# Patient Record
Sex: Female | Born: 1970
Health system: Southern US, Community
[De-identification: ages and names within clinical notes are randomized; demographics above are authoritative.]

## PROBLEM LIST (undated history)

## (undated) DIAGNOSIS — N6452 Nipple discharge: Secondary | ICD-10-CM

## (undated) DIAGNOSIS — K219 Gastro-esophageal reflux disease without esophagitis: Secondary | ICD-10-CM

## (undated) DIAGNOSIS — R638 Other symptoms and signs concerning food and fluid intake: Secondary | ICD-10-CM

## (undated) DIAGNOSIS — D696 Thrombocytopenia, unspecified: Secondary | ICD-10-CM

## (undated) DIAGNOSIS — I471 Supraventricular tachycardia, unspecified: Secondary | ICD-10-CM

## (undated) DIAGNOSIS — Z8719 Personal history of other diseases of the digestive system: Secondary | ICD-10-CM

## (undated) DIAGNOSIS — E119 Type 2 diabetes mellitus without complications: Secondary | ICD-10-CM

## (undated) DIAGNOSIS — N920 Excessive and frequent menstruation with regular cycle: Secondary | ICD-10-CM

## (undated) DIAGNOSIS — E876 Hypokalemia: Secondary | ICD-10-CM

## (undated) DIAGNOSIS — Z8249 Family history of ischemic heart disease and other diseases of the circulatory system: Secondary | ICD-10-CM

## (undated) DIAGNOSIS — I499 Cardiac arrhythmia, unspecified: Secondary | ICD-10-CM

## (undated) DIAGNOSIS — I10 Essential (primary) hypertension: Secondary | ICD-10-CM

## (undated) DIAGNOSIS — E785 Hyperlipidemia, unspecified: Secondary | ICD-10-CM

## (undated) DIAGNOSIS — E0789 Other specified disorders of thyroid: Secondary | ICD-10-CM

## (undated) DIAGNOSIS — T7840XA Allergy, unspecified, initial encounter: Secondary | ICD-10-CM

## (undated) DIAGNOSIS — Z8619 Personal history of other infectious and parasitic diseases: Secondary | ICD-10-CM

## (undated) DIAGNOSIS — E2609 Other primary hyperaldosteronism: Secondary | ICD-10-CM

## (undated) HISTORY — DX: Allergy, unspecified, initial encounter: T78.40XA

## (undated) HISTORY — DX: Thrombocytopenia, unspecified: D69.6

## (undated) HISTORY — DX: Gastro-esophageal reflux disease without esophagitis: K21.9

## (undated) HISTORY — DX: Essential (primary) hypertension: I10

## (undated) HISTORY — DX: Hyperlipidemia, unspecified: E78.5

## (undated) HISTORY — DX: Other primary hyperaldosteronism: E26.09

## (undated) HISTORY — DX: Personal history of other diseases of the digestive system: Z87.19

## (undated) HISTORY — DX: Personal history of other infectious and parasitic diseases: Z86.19

## (undated) HISTORY — DX: Family history of ischemic heart disease and other diseases of the circulatory system: Z82.49

## (undated) HISTORY — DX: Hypokalemia: E87.6

## (undated) HISTORY — DX: Other symptoms and signs concerning food and fluid intake: R63.8

## (undated) HISTORY — PX: ABDOMINAL SURGERY: SHX537

## (undated) HISTORY — PX: HEMORRHOID SURGERY: SHX153

## (undated) HISTORY — PX: UTERINE FIBROID EMBOLIZATION: SHX825

## (undated) HISTORY — DX: Other specified disorders of thyroid: E07.89

---

## 1998-03-05 ENCOUNTER — Inpatient Hospital Stay (HOSPITAL_COMMUNITY): Admission: AD | Admit: 1998-03-05 | Discharge: 1998-03-05 | Payer: Self-pay | Admitting: Obstetrics

## 1998-06-24 ENCOUNTER — Emergency Department (HOSPITAL_COMMUNITY): Admission: EM | Admit: 1998-06-24 | Discharge: 1998-06-24 | Payer: Self-pay

## 1998-10-07 ENCOUNTER — Inpatient Hospital Stay (HOSPITAL_COMMUNITY): Admission: AD | Admit: 1998-10-07 | Discharge: 1998-10-07 | Payer: Self-pay | Admitting: *Deleted

## 1998-11-23 ENCOUNTER — Inpatient Hospital Stay (HOSPITAL_COMMUNITY): Admission: AD | Admit: 1998-11-23 | Discharge: 1998-11-23 | Payer: Self-pay | Admitting: Obstetrics

## 1999-01-12 ENCOUNTER — Inpatient Hospital Stay (HOSPITAL_COMMUNITY): Admission: AD | Admit: 1999-01-12 | Discharge: 1999-01-12 | Payer: Self-pay | Admitting: *Deleted

## 1999-06-08 ENCOUNTER — Emergency Department (HOSPITAL_COMMUNITY): Admission: EM | Admit: 1999-06-08 | Discharge: 1999-06-08 | Payer: Self-pay | Admitting: *Deleted

## 1999-06-17 ENCOUNTER — Other Ambulatory Visit: Admission: RE | Admit: 1999-06-17 | Discharge: 1999-06-17 | Payer: Self-pay | Admitting: Family Medicine

## 2001-10-05 ENCOUNTER — Emergency Department (HOSPITAL_COMMUNITY): Admission: EM | Admit: 2001-10-05 | Discharge: 2001-10-06 | Payer: Self-pay | Admitting: *Deleted

## 2002-07-30 ENCOUNTER — Inpatient Hospital Stay (HOSPITAL_COMMUNITY): Admission: AD | Admit: 2002-07-30 | Discharge: 2002-07-30 | Payer: Self-pay | Admitting: Obstetrics and Gynecology

## 2004-07-22 ENCOUNTER — Inpatient Hospital Stay (HOSPITAL_COMMUNITY): Admission: AD | Admit: 2004-07-22 | Discharge: 2004-07-22 | Payer: Self-pay | Admitting: Obstetrics and Gynecology

## 2004-07-26 ENCOUNTER — Inpatient Hospital Stay (HOSPITAL_COMMUNITY): Admission: AD | Admit: 2004-07-26 | Discharge: 2004-07-26 | Payer: Self-pay | Admitting: Obstetrics & Gynecology

## 2004-07-30 ENCOUNTER — Inpatient Hospital Stay (HOSPITAL_COMMUNITY): Admission: AD | Admit: 2004-07-30 | Discharge: 2004-07-30 | Payer: Self-pay | Admitting: Family Medicine

## 2004-08-27 ENCOUNTER — Other Ambulatory Visit: Admission: RE | Admit: 2004-08-27 | Discharge: 2004-08-27 | Payer: Self-pay | Admitting: Obstetrics and Gynecology

## 2005-05-20 ENCOUNTER — Emergency Department (HOSPITAL_COMMUNITY): Admission: EM | Admit: 2005-05-20 | Discharge: 2005-05-20 | Payer: Self-pay | Admitting: Emergency Medicine

## 2005-07-15 ENCOUNTER — Ambulatory Visit: Payer: Self-pay | Admitting: Internal Medicine

## 2005-08-15 DIAGNOSIS — E0789 Other specified disorders of thyroid: Secondary | ICD-10-CM

## 2005-08-15 HISTORY — DX: Other specified disorders of thyroid: E07.89

## 2005-09-08 ENCOUNTER — Other Ambulatory Visit: Admission: RE | Admit: 2005-09-08 | Discharge: 2005-09-08 | Payer: Self-pay | Admitting: Obstetrics and Gynecology

## 2005-09-10 ENCOUNTER — Ambulatory Visit: Payer: Self-pay | Admitting: Internal Medicine

## 2005-12-27 ENCOUNTER — Ambulatory Visit: Payer: Self-pay | Admitting: Internal Medicine

## 2006-03-03 ENCOUNTER — Ambulatory Visit (HOSPITAL_COMMUNITY): Admission: RE | Admit: 2006-03-03 | Discharge: 2006-03-03 | Payer: Self-pay | Admitting: Internal Medicine

## 2006-03-03 ENCOUNTER — Ambulatory Visit: Payer: Self-pay | Admitting: Internal Medicine

## 2006-06-24 ENCOUNTER — Ambulatory Visit: Payer: Self-pay | Admitting: Internal Medicine

## 2006-06-24 LAB — CONVERTED CEMR LAB
Basophils Absolute: 0 10*3/uL (ref 0.0–0.1)
Basophils Relative: 0.5 % (ref 0.0–1.0)
Eosinophils Absolute: 0.1 10*3/uL (ref 0.0–0.6)
Eosinophils Relative: 1 % (ref 0.0–5.0)
HCT: 37.9 % (ref 36.0–46.0)
Hemoglobin: 13.2 g/dL (ref 12.0–15.0)
Lymphocytes Relative: 36.5 % (ref 12.0–46.0)
MCHC: 34.8 g/dL (ref 30.0–36.0)
MCV: 83.9 fL (ref 78.0–100.0)
Monocytes Absolute: 0.3 10*3/uL (ref 0.2–0.7)
Monocytes Relative: 3.6 % (ref 3.0–11.0)
Neutro Abs: 4.9 10*3/uL (ref 1.4–7.7)
Neutrophils Relative %: 58.4 % (ref 43.0–77.0)
Platelets: 526 10*3/uL — ABNORMAL HIGH (ref 150–400)
RBC: 4.51 M/uL (ref 3.87–5.11)
RDW: 13.4 % (ref 11.5–14.6)
WBC: 8.3 10*3/uL (ref 4.5–10.5)

## 2006-11-02 ENCOUNTER — Ambulatory Visit: Payer: Self-pay | Admitting: Internal Medicine

## 2006-11-02 LAB — CONVERTED CEMR LAB
ALT: 14 units/L (ref 0–40)
AST: 14 units/L (ref 0–37)
Albumin: 3.5 g/dL (ref 3.5–5.2)
Alkaline Phosphatase: 58 units/L (ref 39–117)
BUN: 11 mg/dL (ref 6–23)
Basophils Absolute: 0 10*3/uL (ref 0.0–0.1)
Basophils Relative: 0.3 % (ref 0.0–1.0)
Bilirubin Urine: NEGATIVE
Bilirubin, Direct: 0.1 mg/dL (ref 0.0–0.3)
CO2: 32 meq/L (ref 19–32)
Calcium: 8.9 mg/dL (ref 8.4–10.5)
Chloride: 104 meq/L (ref 96–112)
Cholesterol: 213 mg/dL (ref 0–200)
Creatinine, Ser: 0.9 mg/dL (ref 0.4–1.2)
Crystals: NEGATIVE
Direct LDL: 146.9 mg/dL
Eosinophils Absolute: 0.1 10*3/uL (ref 0.0–0.6)
Eosinophils Relative: 1 % (ref 0.0–5.0)
GFR calc Af Amer: 91 mL/min
GFR calc non Af Amer: 75 mL/min
Glucose, Bld: 113 mg/dL — ABNORMAL HIGH (ref 70–99)
HCT: 38.1 % (ref 36.0–46.0)
HDL: 35.3 mg/dL — ABNORMAL LOW (ref >39.0)
Hemoglobin: 13 g/dL (ref 12.0–15.0)
Hgb A1c MFr Bld: 6.3 % — ABNORMAL HIGH (ref 4.6–6.0)
Ketones, ur: NEGATIVE mg/dL
Leukocytes, UA: NEGATIVE
Lymphocytes Relative: 33.2 % (ref 12.0–46.0)
MCHC: 34.2 g/dL (ref 30.0–36.0)
MCV: 83.2 fL (ref 78.0–100.0)
Monocytes Absolute: 0.6 10*3/uL (ref 0.2–0.7)
Monocytes Relative: 6.6 % (ref 3.0–11.0)
Neutro Abs: 5.2 10*3/uL (ref 1.4–7.7)
Neutrophils Relative %: 58.9 % (ref 43.0–77.0)
Nitrite: POSITIVE — AB
Platelets: 503 10*3/uL — ABNORMAL HIGH (ref 150–400)
Potassium: 2.5 meq/L — CL (ref 3.5–5.1)
RBC: 4.59 M/uL (ref 3.87–5.11)
RDW: 12.9 % (ref 11.5–14.6)
Sodium: 140 meq/L (ref 135–145)
Specific Gravity, Urine: 1.02 (ref 1.000–1.03)
TSH: 1.7 microintl units/mL (ref 0.35–5.50)
Total Bilirubin: 0.4 mg/dL (ref 0.3–1.2)
Total CHOL/HDL Ratio: 6
Total Protein: 7.6 g/dL (ref 6.0–8.3)
Triglycerides: 154 mg/dL — ABNORMAL HIGH (ref 0–149)
Urine Glucose: NEGATIVE mg/dL
Urobilinogen, UA: 0.2 (ref 0.0–1.0)
VLDL: 31 mg/dL (ref 0–40)
WBC: 8.9 10*3/uL (ref 4.5–10.5)
pH: 6 (ref 5.0–8.0)

## 2006-12-31 ENCOUNTER — Emergency Department (HOSPITAL_COMMUNITY): Admission: EM | Admit: 2006-12-31 | Discharge: 2006-12-31 | Payer: Self-pay | Admitting: Emergency Medicine

## 2007-01-04 ENCOUNTER — Encounter: Payer: Self-pay | Admitting: *Deleted

## 2007-01-04 DIAGNOSIS — I10 Essential (primary) hypertension: Secondary | ICD-10-CM | POA: Insufficient documentation

## 2007-01-04 DIAGNOSIS — K219 Gastro-esophageal reflux disease without esophagitis: Secondary | ICD-10-CM | POA: Insufficient documentation

## 2007-04-07 ENCOUNTER — Encounter: Admission: RE | Admit: 2007-04-07 | Discharge: 2007-04-07 | Payer: Self-pay | Admitting: Gastroenterology

## 2007-04-19 ENCOUNTER — Encounter: Admission: RE | Admit: 2007-04-19 | Discharge: 2007-04-19 | Payer: Self-pay | Admitting: Obstetrics and Gynecology

## 2007-12-14 ENCOUNTER — Ambulatory Visit (HOSPITAL_COMMUNITY): Admission: RE | Admit: 2007-12-14 | Discharge: 2007-12-14 | Payer: Self-pay | Admitting: Gastroenterology

## 2008-04-18 LAB — CONVERTED CEMR LAB: Pap Smear: NORMAL

## 2008-04-22 ENCOUNTER — Ambulatory Visit: Payer: Self-pay | Admitting: *Deleted

## 2008-04-22 DIAGNOSIS — D473 Essential (hemorrhagic) thrombocythemia: Secondary | ICD-10-CM | POA: Insufficient documentation

## 2008-04-22 DIAGNOSIS — D759 Disease of blood and blood-forming organs, unspecified: Secondary | ICD-10-CM | POA: Insufficient documentation

## 2008-04-22 DIAGNOSIS — E785 Hyperlipidemia, unspecified: Secondary | ICD-10-CM | POA: Insufficient documentation

## 2008-04-22 DIAGNOSIS — R32 Unspecified urinary incontinence: Secondary | ICD-10-CM | POA: Insufficient documentation

## 2008-04-22 DIAGNOSIS — E876 Hypokalemia: Secondary | ICD-10-CM | POA: Insufficient documentation

## 2008-04-22 DIAGNOSIS — E1169 Type 2 diabetes mellitus with other specified complication: Secondary | ICD-10-CM | POA: Insufficient documentation

## 2008-04-24 LAB — CONVERTED CEMR LAB
ALT: 14 units/L (ref 0–35)
AST: 17 units/L (ref 0–37)
Albumin: 3.5 g/dL (ref 3.5–5.2)
Alkaline Phosphatase: 52 units/L (ref 39–117)
BUN: 11 mg/dL (ref 6–23)
CO2: 33 meq/L — ABNORMAL HIGH (ref 19–32)
Calcium: 8.3 mg/dL — ABNORMAL LOW (ref 8.4–10.5)
Chloride: 98 meq/L (ref 96–112)
Cholesterol: 218 mg/dL (ref 0–200)
Creatinine, Ser: 0.7 mg/dL (ref 0.4–1.2)
Creatinine,U: 168 mg/dL
Direct LDL: 156.3 mg/dL
GFR calc Af Amer: 120 mL/min
GFR calc non Af Amer: 99 mL/min
Glucose, Bld: 90 mg/dL (ref 70–99)
HCT: 37.4 % (ref 36.0–46.0)
HDL: 35.7 mg/dL — ABNORMAL LOW (ref >39.0)
Hemoglobin: 13 g/dL (ref 12.0–15.0)
Hgb A1c MFr Bld: 6.4 % — ABNORMAL HIGH (ref 4.6–6.0)
LDL Cholesterol: 155 mg/dL — ABNORMAL HIGH (ref 0–99)
MCHC: 34.7 g/dL (ref 30.0–36.0)
MCV: 84.3 fL (ref 78.0–100.0)
Microalb Creat Ratio: 30.4 mg/g — ABNORMAL HIGH (ref 0.0–30.0)
Microalb, Ur: 5.1 mg/dL — ABNORMAL HIGH (ref 0.0–1.9)
Monocytes Absolute: 2.7 10*3/uL — ABNORMAL HIGH (ref 0.1–1.0)
Platelets: 496 10*3/uL — ABNORMAL HIGH (ref 150–400)
Potassium: 2.8 meq/L — ABNORMAL LOW (ref 3.5–5.1)
RBC: 4.43 M/uL (ref 3.87–5.11)
RDW: 13.1 % (ref 11.5–14.6)
Sodium: 138 meq/L (ref 135–145)
TSH: 2.19 microintl units/mL (ref 0.35–5.50)
Total Bilirubin: 0.6 mg/dL (ref 0.3–1.2)
Total CHOL/HDL Ratio: 6.1
Total Protein: 7.7 g/dL (ref 6.0–8.3)
Triglycerides: 138 mg/dL (ref 0–149)
VLDL: 28 mg/dL (ref 0–40)
WBC: 7 10*3/uL (ref 4.5–10.5)

## 2008-04-29 ENCOUNTER — Ambulatory Visit: Payer: Self-pay | Admitting: *Deleted

## 2008-04-30 ENCOUNTER — Ambulatory Visit (HOSPITAL_BASED_OUTPATIENT_CLINIC_OR_DEPARTMENT_OTHER): Admission: RE | Admit: 2008-04-30 | Discharge: 2008-04-30 | Payer: Self-pay | Admitting: Obstetrics and Gynecology

## 2008-05-01 ENCOUNTER — Telehealth (INDEPENDENT_AMBULATORY_CARE_PROVIDER_SITE_OTHER): Payer: Self-pay | Admitting: *Deleted

## 2008-05-01 ENCOUNTER — Telehealth: Payer: Self-pay | Admitting: Internal Medicine

## 2008-05-01 LAB — CONVERTED CEMR LAB
Magnesium: 2.1 mg/dL (ref 1.5–2.5)
Potassium: 2.6 meq/L — CL (ref 3.5–5.1)

## 2008-05-03 ENCOUNTER — Ambulatory Visit: Payer: Self-pay | Admitting: Internal Medicine

## 2008-05-03 ENCOUNTER — Ambulatory Visit: Payer: Self-pay | Admitting: Radiology

## 2008-05-08 ENCOUNTER — Ambulatory Visit: Payer: Self-pay | Admitting: Internal Medicine

## 2008-05-08 ENCOUNTER — Telehealth (INDEPENDENT_AMBULATORY_CARE_PROVIDER_SITE_OTHER): Payer: Self-pay | Admitting: *Deleted

## 2008-05-08 LAB — CONVERTED CEMR LAB
BUN: 19 mg/dL (ref 6–23)
CO2: 29 meq/L (ref 19–32)
Calcium: 8.7 mg/dL (ref 8.4–10.5)
Chloride: 105 meq/L (ref 96–112)
Creatinine, Ser: 0.8 mg/dL (ref 0.4–1.2)
GFR calc Af Amer: 103 mL/min
GFR calc non Af Amer: 85 mL/min
Glucose, Bld: 119 mg/dL — ABNORMAL HIGH (ref 70–99)
Potassium: 3.4 meq/L — ABNORMAL LOW (ref 3.5–5.1)
Sodium: 141 meq/L (ref 135–145)

## 2008-05-13 ENCOUNTER — Telehealth (INDEPENDENT_AMBULATORY_CARE_PROVIDER_SITE_OTHER): Payer: Self-pay | Admitting: *Deleted

## 2008-05-17 HISTORY — PX: UTERINE FIBROID EMBOLIZATION: SHX825

## 2008-05-24 ENCOUNTER — Ambulatory Visit: Payer: Self-pay | Admitting: *Deleted

## 2008-05-24 DIAGNOSIS — J309 Allergic rhinitis, unspecified: Secondary | ICD-10-CM | POA: Insufficient documentation

## 2008-05-24 LAB — CONVERTED CEMR LAB
BUN: 21 mg/dL (ref 6–23)
CO2: 30 meq/L (ref 19–32)
Calcium: 9.6 mg/dL (ref 8.4–10.5)
Chloride: 104 meq/L (ref 96–112)
Creatinine, Ser: 0.8 mg/dL (ref 0.4–1.2)
GFR calc Af Amer: 103 mL/min
GFR calc non Af Amer: 85 mL/min
Glucose, Bld: 108 mg/dL — ABNORMAL HIGH (ref 70–99)
Potassium: 3.5 meq/L (ref 3.5–5.1)
Sodium: 140 meq/L (ref 135–145)

## 2008-07-08 ENCOUNTER — Ambulatory Visit: Payer: Self-pay | Admitting: *Deleted

## 2008-07-15 ENCOUNTER — Encounter: Payer: Self-pay | Admitting: Internal Medicine

## 2008-07-19 ENCOUNTER — Ambulatory Visit: Payer: Self-pay | Admitting: *Deleted

## 2008-09-12 ENCOUNTER — Ambulatory Visit: Payer: Self-pay | Admitting: Internal Medicine

## 2008-09-12 DIAGNOSIS — IMO0002 Reserved for concepts with insufficient information to code with codable children: Secondary | ICD-10-CM | POA: Insufficient documentation

## 2008-11-25 ENCOUNTER — Telehealth: Payer: Self-pay | Admitting: Internal Medicine

## 2009-02-07 ENCOUNTER — Ambulatory Visit: Payer: Self-pay | Admitting: Internal Medicine

## 2009-02-07 DIAGNOSIS — K649 Unspecified hemorrhoids: Secondary | ICD-10-CM | POA: Insufficient documentation

## 2009-02-07 DIAGNOSIS — N390 Urinary tract infection, site not specified: Secondary | ICD-10-CM | POA: Insufficient documentation

## 2009-02-07 DIAGNOSIS — K644 Residual hemorrhoidal skin tags: Secondary | ICD-10-CM | POA: Insufficient documentation

## 2009-02-07 DIAGNOSIS — E119 Type 2 diabetes mellitus without complications: Secondary | ICD-10-CM | POA: Insufficient documentation

## 2009-02-07 DIAGNOSIS — E118 Type 2 diabetes mellitus with unspecified complications: Secondary | ICD-10-CM | POA: Insufficient documentation

## 2009-02-07 LAB — CONVERTED CEMR LAB
ALT: 10 units/L (ref 0–35)
AST: 11 units/L (ref 0–37)
Albumin: 4 g/dL (ref 3.5–5.2)
Alkaline Phosphatase: 55 units/L (ref 39–117)
BUN: 17 mg/dL (ref 6–23)
Bilirubin Urine: NEGATIVE
Bilirubin, Direct: 0.1 mg/dL (ref 0.0–0.3)
CO2: 24 meq/L (ref 19–32)
Calcium: 9.1 mg/dL (ref 8.4–10.5)
Chloride: 102 meq/L (ref 96–112)
Cholesterol: 205 mg/dL — ABNORMAL HIGH (ref 0–200)
Creatinine, Ser: 0.9 mg/dL (ref 0.40–1.20)
Glucose, Bld: 106 mg/dL — ABNORMAL HIGH (ref 70–99)
Glucose, Urine, Semiquant: NEGATIVE
HDL: 38 mg/dL — ABNORMAL LOW (ref >39)
Hgb A1c MFr Bld: 6.1 % (ref 4.6–6.1)
Indirect Bilirubin: 0.2 mg/dL (ref 0.0–0.9)
Ketones, urine, test strip: NEGATIVE
LDL Cholesterol: 143 mg/dL — ABNORMAL HIGH (ref 0–99)
Nitrite: NEGATIVE
Potassium: 3.6 meq/L (ref 3.5–5.3)
Protein, U semiquant: >=300
Sodium: 138 meq/L (ref 135–145)
Specific Gravity, Urine: 1.02
TSH: 2.738 microintl units/mL (ref 0.350–4.500)
Total Bilirubin: 0.3 mg/dL (ref 0.3–1.2)
Total CHOL/HDL Ratio: 5.4
Total Protein: 7.6 g/dL (ref 6.0–8.3)
Triglycerides: 122 mg/dL (ref <150)
Urobilinogen, UA: 0.2
VLDL: 24 mg/dL (ref 0–40)
pH: 6

## 2009-02-10 ENCOUNTER — Encounter: Payer: Self-pay | Admitting: Internal Medicine

## 2009-03-17 ENCOUNTER — Encounter: Payer: Self-pay | Admitting: Internal Medicine

## 2009-03-17 LAB — HM DIABETES EYE EXAM: HM Diabetic Eye Exam: NORMAL

## 2009-03-19 ENCOUNTER — Encounter: Payer: Self-pay | Admitting: Internal Medicine

## 2009-04-18 ENCOUNTER — Ambulatory Visit: Payer: Self-pay | Admitting: Family

## 2009-04-18 DIAGNOSIS — F4321 Adjustment disorder with depressed mood: Secondary | ICD-10-CM | POA: Insufficient documentation

## 2009-04-24 LAB — CONVERTED CEMR LAB

## 2009-04-25 ENCOUNTER — Ambulatory Visit: Payer: Self-pay | Admitting: Internal Medicine

## 2009-04-25 LAB — CONVERTED CEMR LAB
BUN: 14 mg/dL (ref 6–23)
CO2: 28 meq/L (ref 19–32)
CRP: 0.4 mg/dL (ref <0.6)
Calcium: 8.9 mg/dL (ref 8.4–10.5)
Chloride: 101 meq/L (ref 96–112)
Cholesterol, target level: 200 mg/dL
Cholesterol: 235 mg/dL — ABNORMAL HIGH (ref 0–200)
Creatinine, Ser: 0.83 mg/dL (ref 0.40–1.20)
Glucose, Bld: 115 mg/dL — ABNORMAL HIGH (ref 70–99)
HDL goal, serum: 40 mg/dL
HDL: 46 mg/dL (ref >39)
Hgb A1c MFr Bld: 6.3 % — ABNORMAL HIGH (ref 4.6–6.1)
LDL Cholesterol: 167 mg/dL — ABNORMAL HIGH (ref 0–99)
LDL Goal: 130 mg/dL
Potassium: 3.5 meq/L (ref 3.5–5.3)
Sodium: 140 meq/L (ref 135–145)
Total CHOL/HDL Ratio: 5.1
Triglycerides: 110 mg/dL (ref <150)
VLDL: 22 mg/dL (ref 0–40)

## 2009-04-27 ENCOUNTER — Encounter: Payer: Self-pay | Admitting: Internal Medicine

## 2009-05-15 ENCOUNTER — Ambulatory Visit (HOSPITAL_BASED_OUTPATIENT_CLINIC_OR_DEPARTMENT_OTHER): Admission: RE | Admit: 2009-05-15 | Discharge: 2009-05-15 | Payer: Self-pay | Admitting: Internal Medicine

## 2009-05-15 LAB — HM MAMMOGRAPHY: HM Mammogram: NORMAL

## 2009-05-19 ENCOUNTER — Encounter (INDEPENDENT_AMBULATORY_CARE_PROVIDER_SITE_OTHER): Payer: Self-pay | Admitting: *Deleted

## 2009-05-19 ENCOUNTER — Ambulatory Visit: Payer: Self-pay | Admitting: Internal Medicine

## 2009-06-04 ENCOUNTER — Encounter: Payer: Self-pay | Admitting: Internal Medicine

## 2010-03-05 ENCOUNTER — Ambulatory Visit: Payer: Self-pay | Admitting: Internal Medicine

## 2010-03-05 LAB — CONVERTED CEMR LAB
ALT: 13 units/L (ref 0–35)
AST: 12 units/L (ref 0–37)
Albumin: 4 g/dL (ref 3.5–5.2)
Alkaline Phosphatase: 51 units/L (ref 39–117)
BUN: 19 mg/dL (ref 6–23)
Bilirubin, Direct: <0.1 mg/dL (ref 0.0–0.3)
CO2: 28 meq/L (ref 19–32)
Calcium: 8.9 mg/dL (ref 8.4–10.5)
Chloride: 101 meq/L (ref 96–112)
Cholesterol: 191 mg/dL (ref 0–200)
Creatinine, Ser: 0.81 mg/dL (ref 0.40–1.20)
Creatinine, Urine: 90.9 mg/dL
Glucose, Bld: 112 mg/dL — ABNORMAL HIGH (ref 70–99)
HDL: 40 mg/dL (ref >39)
Hgb A1c MFr Bld: 6.2 % — ABNORMAL HIGH (ref <5.7)
LDL Cholesterol: 124 mg/dL — ABNORMAL HIGH (ref 0–99)
Microalb Creat Ratio: 38.6 mg/g — ABNORMAL HIGH (ref 0.0–30.0)
Microalb, Ur: 3.51 mg/dL — ABNORMAL HIGH (ref 0.00–1.89)
Potassium: 3.6 meq/L (ref 3.5–5.3)
Sodium: 140 meq/L (ref 135–145)
Total Bilirubin: 0.3 mg/dL (ref 0.3–1.2)
Total CHOL/HDL Ratio: 4.8
Total Protein: 7.5 g/dL (ref 6.0–8.3)
Triglycerides: 137 mg/dL (ref <150)
VLDL: 27 mg/dL (ref 0–40)

## 2010-03-05 LAB — HM DIABETES FOOT EXAM

## 2010-03-06 ENCOUNTER — Encounter: Payer: Self-pay | Admitting: Internal Medicine

## 2010-04-28 ENCOUNTER — Ambulatory Visit: Payer: Self-pay | Admitting: Family

## 2010-04-28 LAB — CONVERTED CEMR LAB
Bilirubin Urine: NEGATIVE
Glucose, Urine, Semiquant: NEGATIVE
Ketones, urine, test strip: NEGATIVE
Nitrite: NEGATIVE
Protein, U semiquant: NEGATIVE
Specific Gravity, Urine: 1.01
Urobilinogen, UA: 0.2
pH: 7.5

## 2010-04-29 ENCOUNTER — Encounter: Payer: Self-pay | Admitting: Internal Medicine

## 2010-05-08 ENCOUNTER — Ambulatory Visit: Payer: Self-pay | Admitting: Internal Medicine

## 2010-06-07 ENCOUNTER — Encounter: Payer: Self-pay | Admitting: Internal Medicine

## 2010-06-17 DIAGNOSIS — R638 Other symptoms and signs concerning food and fluid intake: Secondary | ICD-10-CM

## 2010-06-17 DIAGNOSIS — E6609 Other obesity due to excess calories: Secondary | ICD-10-CM

## 2010-06-17 DIAGNOSIS — E66812 Obesity, class 2: Secondary | ICD-10-CM

## 2010-06-17 HISTORY — DX: Other symptoms and signs concerning food and fluid intake: R63.8

## 2010-06-17 HISTORY — DX: Other obesity due to excess calories: E66.09

## 2010-06-17 HISTORY — DX: Obesity, class 2: E66.812

## 2010-06-18 NOTE — Letter (Signed)
   [place] [facility] [address] [place], [place]  [address]  [place] Phone: [phone]      February 10, 2009   Lynise Gowans [address] [place], [place] [address]  RE:  LAB RESULTS  Dear  Ms. Warzecha,  The following is an interpretation of your most recent lab tests.  Please take note of any instructions provided or changes to medications that have resulted from your lab work.  ELECTROLYTES:  Good - no changes needed  KIDNEY FUNCTION TESTS:  Good - no changes needed  LIVER FUNCTION TESTS:  Good - no changes needed  LIPID PANEL:  Fair - review at your next visit Triglyceride: 122   Cholesterol: 205   LDL: 143   HDL: 38   Chol/HDL%:  5.4 Ratio  THYROID STUDIES:  Thyroid studies normal TSH: 2.738     DIABETIC STUDIES:  Poor - schedule a follow-up appointment soon Blood Glucose: 106   HgbA1C: 6.1   Microalbumin/Creatinine Ratio: 30.4      I will further discuss your lab results at your next follow up appointment.     Sincerely Yours,    Dr. Thomos Lemons

## 2010-06-18 NOTE — Assessment & Plan Note (Signed)
Summary: Follow up/mhf   Vital Signs:  Patient profile:   40 year old female Menstrual status:  regular Height:      62 inches Weight:      209.25 pounds BMI:     38.41 O2 Sat:      98 % on Room air Temp:     98.0 degrees F oral Pulse rate:   84 / minute Pulse rhythm:   regular Resp:     20 per minute BP sitting:   144 / 90  (right arm) Cuff size:   large  Vitals Entered By: Glendell Docker CMA (March 05, 2010 8:54 AM)  O2 Flow:  Room air CC: Follow up Is Patient Diabetic? No Pain Assessment Patient in pain? no      Comments fasting for labs, GYN appt this afternoon at 3:30 LMP - Character: light Menarche (age onset years): 11   Menses interval (days): irregular Menstrual flow (days): 3-4 Last PAP Result Appt today   Primary Care Provider:  Dondra Spry DO  CC:  Follow up.  History of Present Illness:  Hypertension Follow-Up      This is a 40 year old woman who presents for Hypertension follow-up.  The patient denies lightheadedness and headaches.  The patient denies the following associated symptoms: chest pain.  Compliance with medications (by patient report) has been near 100%.    DM II borderline - wt gain  Preventive Screening-Counseling & Management  Alcohol-Tobacco     Alcohol drinks/day: 0     Smoking Status: never  Caffeine-Diet-Exercise     Caffeine use/day: None     Does Patient Exercise: no  Allergies: 1)  ! Lisinopril  Past History:  Past Medical History: GERD Hypertension Hx of ulcer chicken pox Urinary incontinence   Diabetes mellitus, type II borderline Hyperlipidemia thrombocytosis  hypokalemia Allergic rhinitis  Past Surgical History: abdominal surgery as a baby at 6 months - unsure of the surgery hemorrhoids surgery - 2005/2006    Family History: Family History Breast cancer 1st degree relative <50 Family History of CAD Female 1st degree relative <50 Family History Hypertension  Family History Lung cancer Family  History of Stroke M 1st degree relative <50       Social History: Occupation: Public house manager at [employer]  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   Neck:  supple and no masses.   Lungs:  normal respiratory effort and normal breath sounds.   Heart:  normal rate, regular rhythm, and no gallop.   Extremities:  No lower extremity edema  Neurologic:  cranial nerves II-XII intact and gait normal.   Psych:  normally interactive, good eye contact, not anxious appearing, and not depressed appearing.    Diabetes Management Exam:    Foot Exam (with socks and/or shoes not present):       Inspection:          Left foot: normal          Right foot: normal   Impression & Recommendations:  Problem # 1:  HYPERTENSION (ICD-401.9) Assessment Deteriorated exforge cost prohibitive.  add losartan to spironolactone  The following medications were removed from the medication list:    Exforge 5-160 Mg Tabs (Amlodipine besylate-valsartan) ..... One tab by mouth once daily Her updated medication list for this problem includes:    Spironolactone 25 Mg Tabs (Spironolactone) .Marland Kitchen... Take  1 tablet by mouth once a day    Losartan Potassium 50 Mg Tabs (Losartan potassium) ..... One by mouth once daily  BP today: 144/90 Prior BP: 130/90 (05/19/2009)  Prior 10 Yr Risk Heart Disease: 5 % (04/25/2009)  Labs Reviewed: K+: 3.5 (04/25/2009) Creat: : 0.83 (04/25/2009)   Chol: 235 (04/25/2009)   HDL: 46 (04/25/2009)   LDL: 167 (04/25/2009)   TG: 110 (04/25/2009)  Problem # 2:  DIABETES MELLITUS, TYPE II, BORDERLINE (ICD-790.29) Pt counseled on diet and exercise.  Her updated medication list for this problem includes:    Metformin Hcl 500 Mg Tabs (Metformin hcl) ..... One by mouth two times a day  Orders: T-Basic Metabolic Panel [phone]) T- Hemoglobin A1C (09811-91478) T-Urine Microalbumin  w/creat. ratio [id number])  Problem # 3:  HYPERLIPIDEMIA (ICD-272.4)  Her updated medication list for this problem includes:    Lovastatin 20 Mg Tabs (Lovastatin) ..... One by mouth q pm  Orders: T-Lipid Profile ([id number]) T-Hepatic Function [phone])  Complete Medication List: 1)  Freestyle Lite Test Strp (Glucose blood) .... Use as directed.  patient tests once per day.  diagnosis code 250.02 2)  Freestyle Lancets Misc (Lancets) .... Use as directed. patient tests once per day.  diagnosis code 250.02 3)  Spironolactone 25 Mg Tabs (Spironolactone) .... Take 1 tablet by mouth once a day 4)  Metformin Hcl 500 Mg Tabs (Metformin hcl) .... One by mouth two times a day 5)  Lovastatin 20 Mg Tabs (Lovastatin) .... One by mouth q pm 6)  Losartan Potassium 50 Mg Tabs (Losartan potassium) .... One by mouth once daily  Patient Instructions: 1)  Please schedule a follow-up appointment in 2 months. Prescriptions: LOSARTAN POTASSIUM 50 MG TABS (LOSARTAN POTASSIUM) one by mouth once daily  #30 x 2   Entered and Authorized by:   D. Thomos Lemons DO   Signed by:   D. Thomos Lemons DO on 03/05/2010   Method used:   Electronically to        [facility] [address] [place] (retail)       [phone]       [place], [place]  [address]       Ph: [phone]       Fax: [phone]   RxID:   [id number] LOVASTATIN 20 MG TABS (LOVASTATIN) one by mouth q pm  #90 x 1   Entered and Authorized by:   D. Thomos Lemons DO   Signed by:   D. Thomos Lemons DO on 03/05/2010   Method used:   Electronically to        [facility] [address] [place] (retail)       [phone]       [place], [place]  [address]       Ph: [phone]       Fax: [phone]   RxID:   [id number] METFORMIN HCL 500 MG TABS (METFORMIN HCL) one by mouth two times a day  #180 x 1   Entered and Authorized by:   D. Thomos Lemons DO   Signed by:   D. Thomos Lemons DO on  03/05/2010   Method used:   Electronically to        [facility] [address] [place] (retail)       [phone]       [place], [place]  [address]  Ph: [phone]       Fax: [phone]   RxID:   [id number] SPIRONOLACTONE 25 MG TABS (SPIRONOLACTONE) Take 1 tablet by mouth once a day  #90 x 1   Entered and Authorized by:   D. Thomos Lemons DO   Signed by:   D. Thomos Lemons DO on 03/05/2010   Method used:   Electronically to        [facility] [address] [place] [address] (retail)       [phone]       [place], [place]  [address]       Ph: [phone]       Fax: [phone]   RxID:   [id number]    Orders Added: 1)  T-Basic Metabolic Panel [phone] 2)  T- Hemoglobin A1C [83036-23375] 3)  T-Urine Microalbumin w/creat. ratio [[id number]] 4)  T-Lipid Profile [80061-22930] 5)  T-Hepatic Function [80076-22960] 6)  Est. Patient Level III [[address]01]   Immunization History:  Influenza Immunization History:    Influenza:  declined (03/05/2010)   Contraindications/Deferment of Procedures/Staging:    Test/Procedure: FLU VAX    Reason for deferment: patient declined   Immunization History:  Influenza Immunization History:    Influenza:  Declined (03/05/2010)  Current Allergies (reviewed today): ! LISINOPRIL   Preventive Care Screening  Pap Smear:    Date:  03/05/2010    Results:  Appt today

## 2010-06-18 NOTE — Letter (Signed)
Summary: [facility]  [facility]   Imported By: Lanelle Bal 06/19/2009 11:37:50  _____________________________________________________________________  External Attachment:    Type:   Image     Comment:   External Document

## 2010-06-18 NOTE — Assessment & Plan Note (Signed)
Summary: NEW PT TO BE EST.-CH   Vital Signs:  Patient Profile:   40 Years Old Female LMP:     04/13/2008 Height:     62 inches Weight:      211 pounds BMI:     38.73 O2 treatment:    Room Air Temp:     98.6 degrees F oral Pulse rate:   80 / minute Pulse rhythm:   regular Resp:     22 per minute BP sitting:   158 / 92  (right arm) Cuff size:   large  Vitals Entered By: Darra Lis RMA (April 22, 2008 3:11 PM)  Menstrual History: LMP (date): 04/13/2008 LMP - Character: heavy Menarche: 11 Menses interval: irregular days Menstrual flow: 3-4 days             Is Patient Diabetic? No     Visit Type:  new patient PCP:  Paulo Fruit MD  Chief Complaint:  Establish care with a new doctor.Marland Kitchen  History of Present Illness: Patient has been seen at another [place] office, but the [employer] office is closer to home for her - so she wants to establish care here.  She has a history of HTN in the past - she was on a fluid pill for a few months - but no meds for a long time.  She says she was told she did not need the medication any longer.  She had full labs done last year in [place] and those are reviewed with her today.  The cholesterol panel shows high triglycerides, LDL and risk ratio and low HDL - giving her a diagnosis of hyperlipidemia. The HgbA1-C at 6.3 gives her a diagnosis of diabetes - she has a history of gestational diabetes in the past.  She was not aware that she had diabetes, and she does not have a glucometer at home. The CBC shows an elevated platelet count - giving her a diagnosis of thrombocytosis.  She is not aware of having this problem in the past. The potassium is very low at 2.5 - but patient thinks that was while she was on a fluid pill - which she is no longer taking.      Labs Reviewed from 11/02/06: Tests: (1) LIPID PROFILE (LIPID)   CHOLESTEROL          [HH] 213 mg/dL                   1-610   HDL                         [L]  35.3 mg/dL                   >96.0   TRIGLYCERIDES        [H]  154 mg/dL                   4-540  CHOL/HDL Ratio: CHD Risk                             6.0 CALC   LDL CHOLESTEROL        [H]   146.9 mg/dL       <981   VLDL CHOLESTEROL          31 mg/dl                    1-91  Tests: (2) CBC WITH DIFF (CBCD)   WHITE CELL COUNT          8.9 K/uL                    4.5-10.5   HEMATOCRIT                      38.1 %                      36.0-46.0   HEMOGLOBIN                     13.0 g/dL                   81.1-91.4   LYMPHOCYTE %                33.2 %                      12.0-46.0   PLATELET COUNT       [H]  503 K/uL                    150-400   RED CELL COUNT            4.59 Mil/uL                 3.87-5.11  Tests: (4) BASIC METABOLIC PANEL (METABOL)   BUN                            11 mg/dL                    7-82   CALCIUM                      8.9 mg/dL                   9.5-62.1   CHLORIDE                     104 mEq/L                   96-112   CREATININE                   0.9 mg/dL                   3.0-8.6   CARBON DIOXIDE            32 mEq/L                    19-32   GLUCOSE                     [H]  113 mg/dL                   57-84   SODIUM                       140 mEq/L                   135-145   POTASSIUM                [LL] 2.5 mEq/L  3.5-5.1  Tests: (5) HEPATIC FUNCTION PANEL (HEPATIC)   ALBUMIN                          3.5 g/dL                    0.4-5.4   ALKALINE PHOS             58 U/L                      39-117   SGPT (ALT)                         14 U/L                      0-40   SGOT (AST)                        14 U/L                      0-37   TOTAL BILIRUBIN           0.4 mg/dL                   0.9-8.1   TOTAL PROTEIN             7.6 g/dL                    1.9-1.4   DIRECT BILIRUBIN          0.1 mg/dL                   7.8-2.9  Tests: (6) HGB A1C (A1C)   A1C%                               [H]  6.3 %                       4.6-6.0 Tests: (8) FastTSH (TSH)    FastTSH                          1.70 uIU/mL                 0.35-5.50     Updated Prior Medication List: No Medications Current Allergies (reviewed today): No known allergies   Past Medical History:    GERD    Hypertension    Hx of ulcer    chicken pox    Urinary incontinence    Diabetes mellitus, type II    Hyperlipidemia    thrombocytosis  Past Surgical History:    abdominal surgery as a baby at 6 months - unsure of the surgery   Family History:    Family History Breast cancer 1st degree relative <50    Family History of CAD Female 1st degree relative <50    Family History Hypertension    Family History Lung cancer    Family History of Stroke M 1st degree relative <50  Social History:    Occupation: Public house manager at [employer]     Single    2 children   Risk Factors:  Passive smoke exposure:  no Drug use:  no HIV high-risk behavior:  no  Caffeine use:  0 drinks per day Alcohol use:  yes    Type:  wine    Has patient --       Felt need to cut down:  no       Been annoyed by complaints:  no       Felt guilty about drinking:  no       Needed eye opener in the morning:  no    Comments:  once a year    Counseled to quit/cut down alcohol use:  no Exercise:  no Seatbelt use:  100 % Sun Exposure:  occasionally  Family History Risk Factors:    Family History of MI in females < 8 years old:  no    Family History of MI in males < 75 years old:  no  PAP Smear History:     Date of Last PAP Smear:  04/18/2008    Results:  Normal   Mammogram History:     Date of Last Mammogram:  04/18/2007    Results:  Normal Bilateral    Review of Systems       no nausea / vomiting / diarrhea / fever / chills / chest pain / SOB    Physical Exam  General:     Well-developed, well nourished, well hydrated, in no acute distress; appropriate and cooperative   Head:     Normocephalic and atraumatic without obvious abnormalities.  Eyes:     No corneal or conjunctival  inflammation. EOMI. PERRLA. Vision grossly normal. Ears:     External ear shows no significant lesions or deformities.  Otoscopic examination reveals clear canals, tympanic membranes normal bilaterally. Hearing is grossly normal. Nose:     External nasal examination shows no deformity or inflammation.  Mouth:     Oral mucosa and oropharynx without lesions, erythema or exudates.   Neck:     supple, full ROM, no masses, and no thyromegaly.  Lungs:     Normal respiratory effort, chest expands symmetrically with good air flow noted. Lungs clear to auscultation with no crackles, rales or wheezes.   Heart:     Normal rate and  rhythm. S1 and S2 normal, without gallop, murmur, click, rub  Abdomen:     Bowel sounds positive, abdomen soft and non-tender without masses, organomegaly or hernias noted. no distention  Msk:     No gross deformity noted of cervical, thoracic or lumbar spine. normal ROM. no muscle atrophy noted.   Extremities:     No clubbing, cyanosis, edema, or deformity noted, grossly normal full range of motion with all joints.   Neurologic:     alert & oriented X3,  gait normal.  no deficit in strength noted, no balance problems noted, neuro is grossly intact Skin:     Intact without suspicious lesions or rashes Psych:     Cognition and judgment appear intact. Alert and cooperative with normal attention span and concentration. No apparent delusions, illusions, hallucinations, normally interactive, good eye contact, not anxious or depressed appearing, and not agitated.       Impression & Recommendations:  Problem # 1:  DIABETES MELLITUS, UNCONTROLLED (ICD-250.02) Patient given a glucometer and instructed on its use.  She is given Agricultural engineer and questions answered.  labs were obtained. No meds started today - will see how labs look.   She will follow up in one month. Orders: TLB-A1C / Hgb A1C (Glycohemoglobin) (83036-A1C) TLB-Microalbumin/Creat Ratio, Urine  (82043-MALB) TLB-CMP (Comprehensive Metabolic Pnl) (80053-COMP) TLB-TSH (Thyroid  Stimulating Hormone) (84443-TSH)  Her updated medication list for this problem includes:    Lisinopril 10 Mg Tabs (Lisinopril) ..... One tab by mouth once daily   Problem # 2:  HYPERTENSION (ICD-401.9) will start lisinopril and get baseline labs.  follow up in one month. Orders: TLB-CMP (Comprehensive Metabolic Pnl) (80053-COMP) TLB-TSH (Thyroid Stimulating Hormone) (84443-TSH) TLB-Lipid Panel (80061-LIPID) Nutrition Referral (Nutrition)  Her updated medication list for this problem includes:    Lisinopril 10 Mg Tabs (Lisinopril) ..... One tab by mouth once daily   Problem # 3:  HYPERLIPIDEMIA (ICD-272.4) labs repeated.  handout given.  patient wants to try natural methods first. Orders: TLB-Lipid Panel (80061-LIPID) Nutrition Referral (Nutrition)   Problem # 4:  THROMBOCYTOSIS (ICD-289.9) will recheck labs.  if still elevated, will give referral to hematology Orders: TLB-CBC Platelet - w/Differential (85025-CBCD)   Problem # 5:  HYPOKALEMIA (ICD-276.8) patient thinks she was on a fluid pill at the time the labs were done last year and she is no longer on that.  will recheck labs.  will not use a fluid pill for her HTN.  Problem # 6:  ENCOUNTER FOR LONG-TERM USE OF OTHER MEDICATIONS (ICD-V58.69)  Orders: TLB-CBC Platelet - w/Differential (85025-CBCD) TLB-CMP (Comprehensive Metabolic Pnl) (80053-COMP) TLB-TSH (Thyroid Stimulating Hormone) (84443-TSH) TLB-Lipid Panel (80061-LIPID)   Complete Medication List: 1)  Lisinopril 10 Mg Tabs (Lisinopril) .... One tab by mouth once daily 2)  Freestyle Lite Test Strp (Glucose blood) .... Use as directed.  patient tests once per day.  diagnosis code 250.02 3)  Freestyle Lancets Misc (Lancets) .... Use as directed. patient tests once per day.  diagnosis code 250.02   Patient Instructions: 1)  Please schedule a follow-up appointment in 1  month.   Prescriptions: FREESTYLE LANCETS  MISC (LANCETS) use as directed. patient tests once per day.  diagnosis code 250.02  #100 x 1   Entered and Authorized by:   Paulo Fruit MD   Signed by:   Paulo Fruit MD on 04/22/2008   Method used:   Electronically to        [facility] [address] (retail)       [phone]       [place], [place]  [address]       Ph: [phone]       Fax: [phone]   RxID:   [id number] FREESTYLE LITE TEST  STRP (GLUCOSE BLOOD) Use as directed.  patient tests once per day.  diagnosis code 250.02  #100 x 1   Entered and Authorized by:   Paulo Fruit MD   Signed by:   Paulo Fruit MD on 04/22/2008   Method used:   Electronically to        [facility] [address] (retail)       [phone]       [place], [place]  [address]       Ph: [phone]       Fax: [phone]   RxID:   [id number] LISINOPRIL 10 MG TABS (LISINOPRIL) one tab by mouth once daily  #30 x 1   Entered and Authorized by:   Paulo Fruit MD   Signed by:   Paulo Fruit MD on 04/22/2008   Method used:   Electronically to        [facility] [address] (retail)       [phone]  [place], [place]  [address]       Ph: [phone]       Fax: [phone]   RxID:   [id number]  ]  Appended Document: NEW PT TO BE EST.-CH Patient has an prescription from her gyn for her mammogram and an appointment this week to have it completed.

## 2010-06-18 NOTE — Consult Note (Signed)
Summary: [facility]  [facility]   Imported By: Lanelle Bal 07/26/2008 08:29:56  _____________________________________________________________________  External Attachment:    Type:   Image     Comment:   External Document

## 2010-06-18 NOTE — Miscellaneous (Signed)
Summary: Eye Exam  Clinical Lists Changes  Observations: Added new observation of DMEYEEXAMNXT: 03/2010 (03/19/2009 15:08) Added new observation of DMEYEEXMRES: normal (03/17/2009 15:09) Added new observation of EYE EXAM BY: [facility] (03/17/2009 15:09) Added new observation of DIAB EYE EX: normal (03/17/2009 15:09)     Current Allergies: ! LISINOPRIL     Diabetes Management Exam:    Eye Exam:       Eye Exam done elsewhere          Date: 03/17/2009          Results: normal          Done by: [facility]

## 2010-06-18 NOTE — Letter (Signed)
   [place] at [facility] [address] [place], [place]  [address]  [place] Phone: [phone]      April 28, 2009   Delonna Donlon [address] [place], [place] [address]  RE:  LAB RESULTS  Dear  Ms. Inthavong,  The following is an interpretation of your most recent lab tests.  Please take note of any instructions provided or changes to medications that have resulted from your lab work.  ELECTROLYTES:  Good - no changes needed  KIDNEY FUNCTION TESTS:  Good - no changes needed  LIPID PANEL:  Please see the enclosed Rx for a change in medication Triglyceride: 110   Cholesterol: 235   LDL: 167   HDL: 46   Chol/HDL%:  5.1 Ratio   DIABETIC STUDIES:  Improved - continue management Blood Glucose: 115   HgbA1C: 6.3   Microalbumin/Creatinine Ratio: 30.4     I suggest you start pravastatin 40 mg once daily to lower you cholesterol levels.  I advise we repeat your lipid panel and liver function tests at your next office visit (fasting).      Medications Prescribed or Changed PRAVASTATIN SODIUM 40 MG TABS (PRAVASTATIN SODIUM) one by mouth qpm   Sincerely Yours,    Dr. Thomos Lemons

## 2010-06-18 NOTE — Letter (Signed)
Summary: Generic Letter  [facility]  [address] [place], [place] [address]   Phone: [phone]  Fax: [phone]        May 19, 2009 MRN: [id number]    Susan Whitaker [address] [place], [place]  [address]    To Whom It May Concern,    Mrs. Susan Whitaker has been cleared to return to work without any restrictions. This shall become effective on 05/20/2009. Please call my office with any questions or concerns.         Sincerely,  Thomos Lemons, D.O.  This letter has been electronically signed by your physician.

## 2010-06-18 NOTE — Progress Notes (Signed)
Summary: Hypokalemia discussion  Phone Note Other Incoming   Summary of Call: This is a follow up phone note regarding patient's hypokalemia.  I have labs from 04/30/08 showing potassium of 2.6  Unable to reach patient by phone regarding lab results. Called both home and work numbers on 04/30/08 and 05/01/08 in the am, afternoon and after hours on both days.  No answer, but [facility] asking patient to call.  She did not return calls. Initial call taken by: Paulo Fruit MD,  May 01, 2008 8:02 PM  Follow-up for Phone Call        Known history of patient's hypokalemia: On 04/22/08, the patient had her first appointment with me.  I reviewed previous labs done on 11/02/06 which showed a potassium level of 2.5 - patient stated that she believed she was on diuretic meds at that time (but was no longer on diuretics).  11/02/06 labs also revealed a diagnosis of diabetes, hyperlipidemia and thrombocytosis.  She was not aware of any of these findings per her report.  She had not had any labs since that time.  She did not have a home glucometer - so her diabetic control was unkown.  At the 04/22/08 appointment with me, she was restarted on BP meds - lisinopril, given a glucometer, and labs were repeated.  Potassium came back as 2.8 - the exact etiology of the hypokalemia was unclear.  She may have had some polyuria from uncontrolled diabetes, low dietary intake, increased excretion, GI loss . . . and a many other possible etiologies.  Please see labs dated 04/22/08 and addendum dated 04/24/08 for full discussion of labs.    Part of that discussion went as follows: Patient just started lisinopril the day the labs were done which may affect the potassium level.  Will order at pharmacy.  she is to take 4 tabs today, then one tab each day after that.  She is to return for a potassium level next Tuesday 04/29/08.  The labs drawn on 04/29/08 showed potassium level of 2.6 and a normal magnesium level.     Follow-up by: Paulo Fruit MD,  May 01, 2008 8:19 PM  Additional Follow-up for Phone Call Additional follow up Details #1::        After seeing the continued low level of potassium, I was trying to reach the patient by phone to confirm whether she was taking the potassium supplement and the lisinopril.  I also needed some additional information to determine the next best step in working up the hypokalemia including additional labwork and possible imaging studies.  At this point, I suspect hyperaldosteronisim, but I need to eliminate other more basic etiologies before moving forward with the more extensive work up.  I wanted to ask questions including whether or not there was any use of over the counter supplements or medications not yet reported - that could have hypokalemia as a side effect.  Any unreported diarrhea, vomiting, excessive fluid intake, etc.  Any use of  laxatives not reported previously or any  purging not reported - I have no reason to suspect an eating disorder, but I wanted to ask the questions.   No history of escessive alcohol use given or suspected - she only drinks wine about once / year per report.  No sign of significant kidney failure with normal BUN/CR - there was minimal elevation in the microalbumin creatinine ratio, but nothing significant - this should improve with diabetic control and addition of lisinopril.  PCOS is a possibility. She does have irregular periods /  DM / HTN / hyperlipidemia / obesity - but of course, there are more questions to ask.  She had a pap 04/18/08 with gyn reported as within normal limits - so an exact menstrual history is not available in our records.  Patient has had the hypokalemia at a fairly stable level for at least one year, but I do want to quickly move forward with the work up to determine the etiology and needed treatment. Additional Follow-up by: Paulo Fruit MD,  May 01, 2008 8:54 PM

## 2010-06-18 NOTE — Letter (Signed)
Summary: Geralynn Rile  [facility]   Imported By: Lester Villa Pancho 03/24/2009 11:14:08  _____________________________________________________________________  External Attachment:    Type:   Image     Comment:   External Document

## 2010-06-18 NOTE — Progress Notes (Signed)
Summary: Phone note  Phone Note Other Incoming   Summary of Call: Please see previous phone note dated 05/01/08 for discussion on patient's hypokalemia.  Have been trying to reach patient by phone with no success.  Need to ask questions and start more in depth work up of etiology for hypokalemia and replenish potassium.  Patient is a very sweet lady who has gotten a lot of new diagnosis in a very short time.  She needs some TLC.  She is a compliant patient who will be very easy to work with.  I simply have not been able to reach her by phone and I will be out of the office for the next few days.  If patient calls while I am out of the office, please have Dr. Artist Pais review the last phone note.  It details her case and my thoughts about the low potassium.  Reviewed case with Dr. Artist Pais by phone on 05/02/08 am. Initial call taken by: Paulo Fruit MD,  May 02, 2008 8:05 AM  Follow-up for Phone Call        Please call pt and have her come in for OV this week  if possible to discuss low potassium Follow-up by: D. Thomos Lemons DO,  May 02, 2008 8:10 AM  Additional Follow-up for Phone Call Additional follow up Details #1::        Contacted patient. Patient instructed to make appointment. She will come into the office tomorrow. Additional Follow-up by: Darra Lis RMA,  May 02, 2008 9:15 AM

## 2010-06-18 NOTE — Assessment & Plan Note (Signed)
Summary: f/u   Vital Signs:  Patient Profile:   40 Years Old Female LMP:     07/01/2008 Height:     62 inches Weight:      204 pounds BMI:     37.45 O2 treatment:    Room Air Temp:     98.2 degrees F oral Pulse rate:   80 / minute Pulse rhythm:   regular Resp:     20 per minute BP sitting:   140 / 100  (left arm) Cuff size:   large  Vitals Entered By: Darra Lis RMA (July 08, 2008 3:35 PM)  Menstrual History: LMP (date): 07/01/2008 LMP - Character: light                 Visit Type:  follow up PCP:  Paulo Fruit MD  Chief Complaint:  follow up.  History of Present Illness: Patient still has dry cough x off and on for 8 weeks - around the time of starting ACE inhibitor.  patient with chronic, persistent hypokalemia - has referral pending to nephrology for 07/15/08.  Is on supplements with good control.  Patient with chronic thrombocytosis - will await nephrology referral and may consider hematology referral if needed.  patient with new diagnosis of diabetes - BGM running 90 - 120.  Is working with dietician.    patient with new diagnosis of hyperlipidemia.  she is trying to manage with diet / exercise and will be due for repeat labs in June    Updated Prior Medication List: FREESTYLE LITE TEST  STRP (GLUCOSE BLOOD) Use as directed.  patient tests once per day.  diagnosis code 250.02 FREESTYLE LANCETS  MISC (LANCETS) use as directed. patient tests once per day.  diagnosis code 250.02 POTASSIUM CHLORIDE CRYS CR 20 MEQ CR-TABS (POTASSIUM CHLORIDE CRYS CR) one by mouth once daily Lisinopril 20mg  one tab by mouth once daily   Current Allergies (reviewed today): ! LISINOPRIL  Past Medical History:    Reviewed history from 05/24/2008 and no changes required:       GERD       Hypertension       Hx of ulcer       chicken pox       Urinary incontinence       Diabetes mellitus, type II       Hyperlipidemia       thrombocytosis        hypokalemia  Allergic rhinitis  Past Surgical History:    Reviewed history from 05/03/2008 and no changes required:       abdominal surgery as a baby at 6 months - unsure of the surgery             Review of Systems       no nausea / vomiting / diarrhea / fever / chills / chest pain / SOB    Physical Exam  General:     Well-developed, well nourished, well hydrated, in no acute distress; appropriate and cooperative   Head:     Normocephalic and atraumatic without obvious abnormalities.  Eyes:     No corneal or conjunctival inflammation.  Vision grossly normal. Ears:     External ear shows no significant lesions or deformities.  Hearing is grossly normal. Nose:     External nasal examination shows no deformity or inflammation.  Neck:     supple, full ROM Lungs:     Normal respiratory effort, chest expands symmetrically with good air flow noted. Lungs clear  to auscultation with no crackles, rales or wheezes.   Heart:     Normal rate and  rhythm. S1 and S2 normal, without gallop, murmur, click, rub  Extremities:     No cyanosis, edema, or deformity noted, grossly normal full range of motion with all joints.   Neurologic:     alert & oriented X3,  gait normal.  no deficit in strength noted, no balance problems noted, neuro is grossly intact Skin:     Intact without suspicious lesions or rashes Psych:     Cognition and judgment appear intact. Alert and cooperative with normal attention span and concentration. No apparent delusions, illusions, hallucinations, normally interactive, good eye contact, not anxious or depressed appearing, and not agitated.       Impression & Recommendations:  Problem # 1:  HYPERTENSION (ICD-401.9) It appears that the patient has developed an ACE cough.  Will change to diovan.  samples given.  will have to watch potassium levels carefully as she has had some trouble with hypokalemia of unclear etiology and is on potassium supplement for same.  patient aware of  the critical importance of returning for follow up and labs on this new medicaiton. The following medications were removed from the medication list:    Lisinopril 20 Mg Tabs (Lisinopril) ..... One tab by mouth once daily  Her updated medication list for this problem includes:    Diovan 160 Mg Tabs (Valsartan) .Marland Kitchen... 1/2 tab each dayfor 4 days, then whole tab   Problem # 2:  DIABETES MELLITUS, TYPE II (ICD-250.00)  The following medications were removed from the medication list:    Lisinopril 20 Mg Tabs (Lisinopril) ..... One tab by mouth once daily  Her updated medication list for this problem includes:    Diovan 160 Mg Tabs (Valsartan) .Marland Kitchen... 1/2 tab each dayfor 4 days, then whole tab  Orders: Nutrition Referral (Nutrition)   Problem # 3:  HYPOKALEMIA (ICD-276.8) continue supplement for now - but return next week for follow up and labs  Problem # 4:  THROMBOCYTOSIS (ICD-289.9) chronic history - will await nephrology input and then consider hematology referral if needed  Problem # 5:  HYPERLIPIDEMIA (ICD-272.4) patient trying diet and exercise - will repeat labs in June  Complete Medication List: 1)  Freestyle Lite Test Strp (Glucose blood) .... Use as directed.  patient tests once per day.  diagnosis code 250.02 2)  Freestyle Lancets Misc (Lancets) .... Use as directed. patient tests once per day.  diagnosis code 250.02 3)  Potassium Chloride Crys Cr 20 Meq Cr-tabs (Potassium chloride crys cr) .... One by mouth once daily 4)  Diovan 160 Mg Tabs (Valsartan) .... 1/2 tab each dayfor 4 days, then whole tab   Patient Instructions: 1)  Please schedule a follow-up appointment next wednesday

## 2010-06-18 NOTE — Assessment & Plan Note (Signed)
Summary: 1 WEEK ROV-CH   Vital Signs:  Patient Profile:   40 Years Old Female Height:     62 inches Weight:      206 pounds BMI:     37.81 O2 treatment:    Room Air Temp:     98.0 degrees F oral Pulse rate:   88 / minute Pulse rhythm:   regular Resp:     20 per minute BP sitting:   140 / 100  (left arm) Cuff size:   large  Vitals Entered By: Darra Lis RMA (July 19, 2008 8:44 AM)                 Visit Type:  follow up PCP:  Paulo Fruit MD  Chief Complaint:  recheck.  History of Present Illness: Patient is feeling good on Diovan - no side effects - but no change in BP after changing from lisinopril to Diovan. Patient still has a little bit of the dry cough - but improved - this was presumably  caused from the ACE inhibitor which was discontinued a few weeks ago.  patient with chronic, persistent hypokalemia - had first appt with nephrology on 07/15/08. They did some labwork at that time, so will not do labs today.  She has follow up already scheduled with them. They talked about doing additional studies after the lab results were back.  Patient with chronic thrombocytosis - will await nephrology referral and may consider hematology referral if needed.  patient with new diagnosis of diabetes - BGM running 90 - 120.  Is working with dietician.    patient with new diagnosis of hyperlipidemia.  she is trying to manage with diet / exercise and will be due for repeat labs in June  Mother with stage 4 breast cancer with mets in the liver - started chemo this week - patient is having a tough time dealing with the idea that her previously very healthy mother is having such a tough time with her health.  She has good family support and overall, they are doing well.    Updated Prior Medication List: FREESTYLE LITE TEST  STRP (GLUCOSE BLOOD) Use as directed.  patient tests once per day.  diagnosis code 250.02 FREESTYLE LANCETS  MISC (LANCETS) use as directed. patient tests once  per day.  diagnosis code 250.02 POTASSIUM CHLORIDE CRYS CR 20 MEQ CR-TABS (POTASSIUM CHLORIDE CRYS CR) one by mouth once daily DIOVAN 160 MG TABS (VALSARTAN) 1/2 tab each dayfor 4 days, then whole tab  Current Allergies (reviewed today): ! LISINOPRIL  Past Medical History:    Reviewed history from 05/24/2008 and no changes required:       GERD       Hypertension       Hx of ulcer       chicken pox       Urinary incontinence       Diabetes mellitus, type II       Hyperlipidemia       thrombocytosis        hypokalemia       Allergic rhinitis     Review of Systems       no nausea / vomiting / diarrhea / fever / chills / chest pain / SOB    Physical Exam  General:     Well-developed, well nourished, well hydrated, in no acute distress; appropriate and cooperative   Head:     Normocephalic and atraumatic without obvious abnormalities.  Eyes:  No corneal or conjunctival inflammation.  Vision grossly normal. Ears:     External ear shows no significant lesions or deformities.  Hearing is grossly normal. Nose:     External nasal examination shows no deformity or inflammation.  Neck:     supple, full ROM Lungs:     Normal respiratory effort, chest expands symmetrically with good air flow noted. Lungs clear to auscultation with no crackles, rales or wheezes.   Heart:     Normal rate and  rhythm. S1 and S2 normal, without gallop, murmur, click, rub  Extremities:     No cyanosis, edema, or deformity noted, grossly normal full range of motion with all joints.   Neurologic:     alert & oriented X3,  gait normal.  no deficit in strength noted, no balance problems noted, neuro is grossly intact Skin:     Intact without suspicious lesions or rashes Psych:     Cognition and judgment appear intact. Alert and cooperative with normal attention span and concentration. No apparent delusions, illusions, hallucinations, normally interactive, good eye contact, not anxious or depressed  appearing, and not agitated.       Impression & Recommendations:  Problem # 1:  HYPERTENSION (ICD-401.9) Will try the patient on a combo of diovan and amlodopine.  will avoid HCTZ as the patient already has a problem with hypokalemia of unknown etiology.  patient to follow up with me in one month - unless nephrology changes her BP meds and then she can just follow up with them until they release her.  Patient to call if symptoms increase / change / persist or any concerns.  The following medications were removed from the medication list:    Diovan 160 Mg Tabs (Valsartan) .Marland Kitchen... 1/2 tab each dayfor 4 days, then whole tab  Her updated medication list for this problem includes:    Exforge 5-160 Mg Tabs (Amlodipine besylate-valsartan) ..... One tab by mouth once daily   Problem # 2:  HYPOKALEMIA (ICD-276.8) conitnue supplement - patient just had labs at nephrology on Monday, so will not repeat here today.  she will get me copies of the labs  Problem # 3:  THROMBOCYTOSIS (ICD-289.9) Patient with chronic thrombocytosis - will await nephrology referral and may consider hematology referral if needed.  Problem # 4:  DIABETES MELLITUS, TYPE II (ICD-250.00) patient doing well with dietary changes.  continue current plan. The following medications were removed from the medication list:    Diovan 160 Mg Tabs (Valsartan) .Marland Kitchen... 1/2 tab each dayfor 4 days, then whole tab   Problem # 5:  HYPERLIPIDEMIA (ICD-272.4) patient is trying diet / exercise and we will repeat labs in June  Complete Medication List: 1)  Freestyle Lite Test Strp (Glucose blood) .... Use as directed.  patient tests once per day.  diagnosis code 250.02 2)  Freestyle Lancets Misc (Lancets) .... Use as directed. patient tests once per day.  diagnosis code 250.02 3)  Potassium Chloride Crys Cr 20 Meq Cr-tabs (Potassium chloride crys cr) .... One by mouth once daily 4)  Exforge 5-160 Mg Tabs (Amlodipine besylate-valsartan) .... One  tab by mouth once daily   Patient Instructions: 1)  Please schedule a follow-up appointment in 1 month.

## 2010-06-18 NOTE — Assessment & Plan Note (Signed)
Summary: Discuss low potassium   Vital Signs:  Patient Profile:   40 Years Old Female Height:     62 inches Weight:      212 pounds BMI:     38.92 Temp:     98.5 degrees F oral Pulse rate:   82 / minute Pulse rhythm:   regular Resp:     18 per minute BP sitting:   146 / 80  (left arm) Cuff size:   large  Vitals Entered By: Glendell Docker CMA (May 03, 2008 9:05 AM)                 PCP:  Paulo Fruit MD  Chief Complaint:  Follow up on Potassium.  History of Present Illness: Follow up on Potassium  40 year old African-American female recently seen by Dr. Andrey Campanile.  On routine blood work patient found to have hypokalemia.  Patient has had previous issues with hypokalemia in 2008.  She has hypertension and newly diagnosed borderline type 2 diabetes.  She denies chronic diarrhea now.  She denies taking laxatives.  She denies nausea or vomiting.  Since previous visit patient was advised to start potassium replacement.  Patient reports she never got the message and did not start her potassium pills.    Current Allergies (reviewed today): No known allergies   Past Medical History:    GERD    Hypertension    Hx of ulcer    chicken pox    Urinary incontinence    Diabetes mellitus, type II    Hyperlipidemia    thrombocytosis   Past Surgical History:    abdominal surgery as a baby at 6 months - unsure of the surgery        Family History:    Family History Breast cancer 1st degree relative <50    Family History of CAD Female 1st degree relative <50    Family History Hypertension    Family History Lung cancer    Family History of Stroke M 1st degree relative <50   Social History:    Occupation: Public house manager at [employer]     Single    2 children      Physical Exam  General:     alert and overweight-appearing.   Head:     normocephalic and atraumatic.   Neck:     supple, no masses, and no carotid bruits.   Lungs:     normal respiratory effort and  normal breath sounds.   Heart:     normal rate, regular rhythm, and no gallop.   Abdomen:     soft, non-tender, and no masses.   Extremities:     No lower extremity edema     Impression & Recommendations:  Problem # 1:  HYPOKALEMIA (ICD-63.12) 40 year old African-American female with history of hypertension and borderline type 2 diabetes found to have hypokalemia.  She does not use laxatives.  There is no report of vomiting or diarrhea suggestive of GI loss.  Consider primary aldosteronism.  Other considerations include  cushings syndrome or Liddle's syndrome.   Refer to nephrology for further workup.   Start potassium replacement.   Repeat BMET in one week.  Problem # 2:  HYPERTENSION (ICD-401.9) Improved.   Wait six weeks before further titration of medication.  Her updated medication list for this problem includes:    Lisinopril 10 Mg Tabs (Lisinopril) ..... One tab by mouth once daily  BP today: 146/80 Prior BP: 158/92 (04/22/2008)  Labs Reviewed: Creat: 0.7 (  04/22/2008) Chol: 218 (04/22/2008)   HDL: 35.7 (04/22/2008)   LDL: 156.3 (04/22/2008)   TG: 138 (04/22/2008)   Complete Medication List: 1)  Lisinopril 10 Mg Tabs (Lisinopril) .... One tab by mouth once daily 2)  Freestyle Lite Test Strp (Glucose blood) .... Use as directed.  patient tests once per day.  diagnosis code 250.02 3)  Freestyle Lancets Misc (Lancets) .... Use as directed. patient tests once per day.  diagnosis code 250.02 4)  Potassium Chloride Crys Cr 20 Meq Cr-tabs (Potassium chloride crys cr) .... 2 tabs by mouth once daily x 5 days, then one by mouth qd  Other Orders: Nephrology Referral (Nephro)   Patient Instructions: 1)  Please come in for follow up blood test on 05/08/08.   2)  BMET:  401.9   Prescriptions: POTASSIUM CHLORIDE CRYS CR 20 MEQ CR-TABS (POTASSIUM CHLORIDE CRYS CR) 2 tabs by mouth once daily x 5 days, then one by mouth qd  #30 x 1   Entered and Authorized by:   D. Thomos Lemons DO    Signed by:   D. Thomos Lemons DO on 05/03/2008   Method used:   Electronically to        [facility] [address] (retail)       [phone]       [place], [place]  [address]       Ph: [phone]       Fax: [phone]   RxID:   [id number]  ] Current Allergies (reviewed today): No known allergies

## 2010-06-18 NOTE — Assessment & Plan Note (Signed)
Summary: wound on ankle ( dr. Andrey Campanile Diabetic pt. )- Montez Hageman   Vital Signs:  Patient profile:   40 year old female Height:      62 inches Weight:      211.50 pounds BMI:     38.82 Temp:     98.4 degrees F oral Pulse rate:   80 / minute Pulse rhythm:   regular Resp:     20 per minute BP sitting:   126 / 84  (right arm) Cuff size:   regular  Vitals Entered By: Glendell Docker CMA (September 12, 2008 9:13 AM)  Primary Care Irvin Lizama:  Paulo Fruit MD  CC:  Knot & scratch on left foot and Type 2 diabetes mellitus follow-up.  History of Present Illness: evaluation of know and scratch on left foot, noticed on Monday , attempted to cleanse area with peroxide with some improvement  Hypertension Follow-Up      This is a 40 year old woman who presents for Hypertension follow-up.  The patient denies lightheadedness, urinary frequency, and edema.  The patient denies the following associated symptoms: chest pain.  Compliance with medications (by patient report) has been near 100%.  The patient reports that dietary compliance has been fair.    Type 2 Diabetes Mellitus Follow-Up      The patient is also here for Type 2 diabetes mellitus follow-up.  The patient denies polyuria, polydipsia, and weight gain.  The patient denies the following symptoms: neuropathic pain and chest pain.  Since the last visit the patient reports good dietary compliance.    Allergies: 1)  ! Lisinopril  Family History:    Family History Breast cancer 1st degree relative <50    Family History of CAD Female 1st degree relative <50    Family History Hypertension     Family History Lung cancer    Family History of Stroke M 1st degree relative <50   Social History:    Occupation: Public house manager at [employer]     Single     2 children   Physical Exam  General:  alert and overweight-appearing.   Head:  normocephalic and atraumatic.   Neck:  supple, no masses, and no carotid bruits.   Lungs:  normal respiratory effort and  normal breath sounds.   Heart:  normal rate, regular rhythm, and no gallop.   Abdomen:  soft and non-tender.   Skin:  small abasion of left foot,  no erythema  Diabetes Management Exam:    Foot Exam (with socks and/or shoes not present):       Inspection:          Left foot: normal          Right foot: normal   Impression & Recommendations:  Problem # 1:  DIABETES MELLITUS, TYPE II (ICD-250.00) Pt with mild diabetes.    Refer for diabetic education.   Start metformin.   Her updated medication list for this problem includes:    Exforge 5-160 Mg Tabs (Amlodipine besylate-valsartan) ..... One tab by mouth once daily    Metformin Hcl 500 Mg Xr24h-tab (Metformin hcl) ..... One by mouth two times a day  Orders: Ophthalmology Referral (Ophthalmology) Diabetic Clinic Referral (Diabetic)  Labs Reviewed: Creat: 0.8 (05/24/2008)    Reviewed HgBA1c results: 6.4 (04/22/2008)  6.3 (11/02/2006)  Problem # 2:  HYPERTENSION (ICD-401.9) BP improved.    Pt seen by nephrology for hypokalemia.   Complete work up pending.   She was started on aldactone.  Her updated medication list for this problem includes:    Exforge 5-160 Mg Tabs (Amlodipine besylate-valsartan) ..... One tab by mouth once daily    Spironolactone 25 Mg Tabs (Spironolactone) .Marland Kitchen... Take 1 tablet by mouth once a day b  BP today: 126/84 Prior BP: 140/100 (07/19/2008)  Labs Reviewed: K+: 3.5 (05/24/2008) Creat: : 0.8 (05/24/2008)   Chol: 218 (04/22/2008)   HDL: 35.7 (04/22/2008)   LDL: 155 (04/22/2008)   TG: 138 (04/22/2008)  Problem # 3:  ABRASION, FOOT (ICD-917.0) Pt with minimal abrasion left foot.   Continue to keep area clean and use OTC triple abx ointment.   Patient advised to call office if symptoms persist or worsen.  Complete Medication List: 1)  Freestyle Lite Test Strp (Glucose blood) .... Use as directed.  patient tests once per day.  diagnosis code 250.02 2)  Freestyle Lancets Misc (Lancets) .... Use as  directed. patient tests once per day.  diagnosis code 250.02 3)  Exforge 5-160 Mg Tabs (Amlodipine besylate-valsartan) .... One tab by mouth once daily 4)  Spironolactone 25 Mg Tabs (Spironolactone) .... Take 1 tablet by mouth once a day b 5)  Metformin Hcl 500 Mg Xr24h-tab (Metformin hcl) .... One by mouth two times a day  Patient Instructions: 1)  Please schedule a follow-up appointment in 3 months. 2)  Use lamisil cream over the counter two times a day x 1 week. 3)  BMP prior to visit, ICD-9:  401.9 4)  Hepatic Panel prior to visit, ICD-9: 272.4 5)  Lipid Panel prior to visit, ICD-9: 272.4 6)  TSH prior to visit, ICD-9: 272.4 7)  HbgA1C prior to visit, ICD-9: 250.00 8)  Urine Microalbumin prior to visit, ICD-9: 250.00 9)  Please return for lab work one (1) week before your next appointment.  Prescriptions: EXFORGE 5-160 MG TABS (AMLODIPINE BESYLATE-VALSARTAN) one tab by mouth once daily  #30 x 3   Entered and Authorized by:   D. Thomos Lemons DO   Signed by:   D. Thomos Lemons DO on 09/12/2008   Method used:   Electronically to        [facility] [address] (retail)       [phone]       [place], [place]  [address]       Ph: [phone]       Fax: [phone]   RxID:   [id number] METFORMIN HCL 500 MG XR24H-TAB (METFORMIN HCL) one by mouth two times a day  #60 x 3   Entered and Authorized by:   D. Thomos Lemons DO   Signed by:   D. Thomos Lemons DO on 09/12/2008   Method used:   Electronically to        [facility] [address] (retail)       [phone]       [place], [place]  [address]       Ph: [phone]       Fax: [phone]   RxID:   [id number]   Current Allergies (reviewed today): ! LISINOPRIL

## 2010-06-18 NOTE — Assessment & Plan Note (Signed)
Summary: CPX/MHF, request a A1c & Cholesterol   Vital Signs:  Patient profile:   40 year old female Menstrual status:  regular Weight:      200.50 pounds BMI:     36.80 O2 Sat:      99 % on Room air Temp:     98.2 degrees F oral Pulse rate:   88 / minute Pulse rhythm:   regular Resp:     16 per minute BP sitting:   122 / 90  (right arm) Cuff size:   large  Vitals Entered By: Glendell Docker CMA (April 25, 2009 8:42 AM)  O2 Flow:  Room air  Primary Care Kate Sweetman:  D. Thomos Lemons DO   History of Present Illness: 40 y/o AA F for routine CPX.  Since prev visit, pt's mother died from metastatatic breast cancer.   Pt was primary care giver.  pt seen by NP and started on celexa.  some somnolence with clonazepam.  she is enrolled in counseling.  No suicidal ideation.    Htn - stable.  DM II  Lipid Management History:      Positive NCEP/ATP III risk factors include HDL cholesterol less than 40 and hypertension.  Negative NCEP/ATP III risk factors include female age less than 57 years old, no history of early menopause without estrogen hormone replacement, non-diabetic, no family history for ischemic heart disease, non-tobacco-user status, no ASHD (atherosclerotic heart disease), no prior stroke/TIA, no peripheral vascular disease, and no history of aortic aneurysm.    Preventive Screening-Counseling & Management  Alcohol-Tobacco     Alcohol drinks/day: 0     Alcohol Counseling: not indicated; patient does not drink     Smoking Status: never     Tobacco Counseling: not indicated; no tobacco use  Caffeine-Diet-Exercise     Caffeine use/day: 1beverage per week     Does Patient Exercise: yes     Type of exercise: walking dog daily     Times/week: 5  Allergies: 1)  ! 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   Head:  normocephalic and atraumatic.   Ears:  R ear normal and L ear normal.   Mouth:  Oral mucosa and oropharynx without lesions or exudates.   Neck:  No deformities, masses, or tenderness noted.no carotid bruits.   Lungs:  normal respiratory effort and normal breath sounds.   Heart:  normal rate, regular rhythm, and no gallop.   Abdomen:  soft, non-tender, and normal bowel sounds.   Extremities:  trace left pedal edema and trace right pedal edema.   Neurologic:  cranial nerves II-XII intact and gait normal.     Impression & Recommendations:  Problem # 1:  PREVENTIVE HEALTH CARE (ICD-V70.0) Reviewed adult health maintenance protocols.  schedule screening mammo  Mammogram: Normal Bilateral (04/18/2007) Pap smear: Done (04/24/2009) Chol: 205 (02/07/2009)   HDL: 38 (02/07/2009)   LDL: 143 (02/07/2009)   TG: 122 (02/07/2009) TSH: 2.738 (02/07/2009)   HgbA1C: 6.1 (02/07/2009)     Problem # 2:  HYPERLIPIDEMIA (  ICD-272.4) Goal LDL < 130.  consider pravastatin. Orders: T-Lipid Profile [id number]) T- * Misc. Laboratory test [id number])  Labs Reviewed: SGOT: 11 (02/07/2009)   SGPT: 10 (02/07/2009)   HDL:38 (02/07/2009), 35.7 (04/22/2008)  LDL:143 (02/07/2009), 155 (04/22/2008)  Chol:205 (02/07/2009), 218 (04/22/2008)  Trig:122 (02/07/2009), 138 (04/22/2008)  Problem # 3:  DIABETES MELLITUS, TYPE II, BORDERLINE (ICD-790.29) Monitor A1c.  Lifestyle changes reviewed.  Her updated medication list for this problem includes:    Metformin Hcl 500 Mg Xr24h-tab (Metformin hcl) ..... One by mouth two times a day  Orders: T-Basic Metabolic  Panel ([id number]) T- Hemoglobin A1C ([id number])  Problem # 4:  GRIEF REACTION, ACUTE (ICD-309.0) Assessment: Improved counseled pt - 15 mins.  Complete Medication List: 1)  Freestyle Lite Test Strp (Glucose blood) .... Use as directed.  patient tests once per day.  diagnosis code 250.02 2)  Freestyle Lancets Misc (Lancets) .... Use as directed. patient tests once per day.  diagnosis code 250.02 3)  Exforge 5-160 Mg Tabs (Amlodipine besylate-valsartan) .... One tab by mouth once daily 4)  Spironolactone 25 Mg Tabs (Spironolactone) .... Take 1 tablet by mouth once a day b 5)  Metformin Hcl 500 Mg Xr24h-tab (Metformin hcl) .... One by mouth two times a day 6)  Celexa 20 Mg Tabs (Citalopram hydrobromide) .... Take one half tablet daily for 1 week, then increase to 1 tablet by mouth daily 7)  Klonopin 0.5 Mg Tabs (Clonazepam) .... One tablet by mouth three times a day as needed severe anxiety/panic attack  Lipid Assessment/Plan:      Based on NCEP/ATP III, the patient's risk factor category is "2 or more risk factors and a calculated 10 year CAD risk of < 20%".  The patient's lipid goals are as follows: Total cholesterol goal is 200; LDL cholesterol goal is 130; HDL cholesterol goal is 40; Triglyceride goal is 150.  Her LDL cholesterol goal has not been met.  Secondary causes for hyperlipidemia have been ruled out.  She has been provided with dietary instructions to lower her cholesterol.     Patient Instructions: 1)  Please schedule a follow-up appointment in 6 weeks. 2)  If you feel dizzy, continue 1/2 of citalopram dose.   Preventive Care Screening  Pap Smear:    Date:  04/24/2009    Results:  Done    Current Allergies (reviewed today): ! LISINOPRIL

## 2010-06-18 NOTE — Assessment & Plan Note (Signed)
Summary: UTI/hea--Rm 5   Vital Signs:  Patient profile:   40 year old female Menstrual status:  regular Height:      62 inches Weight:      213 pounds BMI:     39.10 Temp:     98.0 degrees F oral Pulse rate:   78 / minute Pulse rhythm:   regular Resp:     20 per minute BP sitting:   144 / 98  (right arm) Cuff size:   large  Vitals Entered By: Mervin Kung CMA Duncan Dull) (April 28, 2010 11:17 AM) CC: Pt states she has had urinary frequency and dysuria since Thursday without relief from OTC meds. Is Patient Diabetic? Yes Comments Pt states she has not taken her BP med this morning. Nicki Guadalajara Fergerson CMA Duncan Dull)  April 28, 2010 11:24 AM    Primary Care Provider:  Dondra Spry DO  CC:  Pt states she has had urinary frequency and dysuria since Thursday without relief from OTC meds..  History of Present Illness: Ms. Crossland is a 40 year old female who presents today with complaint of dysuria and frequency.  Symptoms started 6 days ago.  Tried azo and cranberry juice without improvement.  Denies fever, low back pain or gross hematuria. Last dose of Azo was yesterday.  + history of UTI in the past.  Symptoms today are the same.    Allergies: 1)  ! Lisinopril  Past History:  Past Medical History: Last updated: 03/05/2010 GERD Hypertension Hx of ulcer chicken pox Urinary incontinence   Diabetes mellitus, type II borderline Hyperlipidemia thrombocytosis  hypokalemia Allergic rhinitis  Past Surgical History: Last updated: 03/05/2010 abdominal surgery as a baby at 6 months - unsure of the surgery hemorrhoids surgery - 2005/2006    Review of Systems       see HPI  Physical Exam  General:  Well-developed,well-nourished,in no acute distress; alert,appropriate and cooperative throughout examination Lungs:  Normal respiratory effort, chest expands symmetrically. Lungs are clear to auscultation, no crackles or wheezes. Heart:  Normal rate and regular rhythm. S1 and S2  normal without gallop, murmur, click, rub or other extra sounds. Abdomen:  Bowel sounds positive,abdomen soft and non-tender without masses, organomegaly or hernias noted. No suprapubic tenderness. Msk:  neg CVAT bilaterally Psych:  Cognition and judgment appear intact. Alert and cooperative with normal attention span and concentration. No apparent delusions, illusions, hallucinations   Impression & Recommendations:  Problem # 1:  UTI (ICD-599.0) Assessment New 40 year old female presents with complaint of dysuria and frequency.  UA + blood and trace leukocytes.  Will plan to treat with Cipro.  Urine will be cultured.  Pt instructed to call for f/u as outlined in pt instructions.  Her updated medication list for this problem includes:    Cipro 500 Mg Tabs (Ciprofloxacin hcl) ..... One tablet by mouth twice daily for 7 days  Orders: T-Culture, Urine ([id number]) UA Dipstick w/o Micro (manual) (14782) Specimen Handling (99000)  Complete Medication List: 1)  Freestyle Lite Test Strp (Glucose blood) .... Use as directed.  patient tests once per day.  diagnosis code 250.02 2)  Freestyle Lancets Misc (Lancets) .... Use as directed. patient tests once per day.  diagnosis code 250.02 3)  Spironolactone 25 Mg Tabs (Spironolactone) .... Take 1 tablet by mouth once a day 4)  Metformin Hcl 500 Mg Tabs (Metformin hcl) .... One by mouth two times a day 5)  Lovastatin 20 Mg Tabs (Lovastatin) .... One by mouth q pm  6)  Losartan Potassium 50 Mg Tabs (Losartan potassium) .... One by mouth once daily 7)  Cipro 500 Mg Tabs (Ciprofloxacin hcl) .... One tablet by mouth twice daily for 7 days  Patient Instructions: 1)  Please call if you develop fever over 101, blood in urine, low back pain, if symptoms worsen or if not improved in 48-72 hours. Prescriptions: CIPRO 500 MG TABS (CIPROFLOXACIN HCL) one tablet by mouth twice daily for 7 days  #14 x 0   Entered and Authorized by:   Lemont Fillers FNP    Signed by:   Lemont Fillers FNP on 04/28/2010   Method used:   Electronically to        [facility] [address] (retail)       [phone]       [place], [place]  [address]       Ph: [phone]       Fax: [phone]   RxID:   [id number]    Orders Added: 1)  T-Culture, Urine [[id number]] 2)  UA Dipstick w/o Micro (manual) [81002] 3)  Specimen Handling [99000] 4)  Est. Patient Level III [36644]    Current Allergies (reviewed today): ! LISINOPRIL   Laboratory Results   Urine Tests   Date/Time Reported: Mervin Kung CMA Duncan Dull)  April 28, 2010 11:44 AM   Routine Urinalysis   Color: orange Appearance: Clear Glucose: negative   (Normal Range: Negative) Bilirubin: negative   (Normal Range: Negative) Ketone: negative   (Normal Range: Negative) Spec. Gravity: 1.010   (Normal Range: 1.003-1.035) Blood: moderate   (Normal Range: Negative) pH: 7.5   (Normal Range: 5.0-8.0) Protein: negative   (Normal Range: Negative) Urobilinogen: 0.2   (Normal Range: 0-1) Nitrite: negative   (Normal Range: Negative) Leukocyte Esterace: trace   (Normal Range: Negative)    Comments: Urine cultured. Nicki Guadalajara Fergerson CMA Duncan Dull)  April 28, 2010 11:44 AM

## 2010-06-18 NOTE — Letter (Signed)
   [place] [facility] [address] [place], [place]  [address]  [place] Phone: [phone]      March 06, 2010   [name] [id number] [place], [place] [address]  RE:  LAB RESULTS  Dear  [name],  The following is an interpretation of your most recent lab tests.  Please take note of any instructions provided or changes to medications that have resulted from your lab work.  ELECTROLYTES:  Good - no changes needed  KIDNEY FUNCTION TESTS:  Good - no changes needed  LIPID PANEL:  Stable - no changes needed Triglyceride: 137   Cholesterol: 191   LDL: 124   HDL: 40   Chol/HDL%:  4.8 Ratio   DIABETIC STUDIES:  Good - no changes needed Blood Glucose: 112   HgbA1C: 6.2   Microalbumin/Creatinine Ratio: 38.6            Sincerely Yours,    Dr. Thomos Lemons  Appended Document:  mailed  Appended Document:  Pt called requesting results, verbal was given to pt.

## 2010-06-18 NOTE — Assessment & Plan Note (Signed)
Summary: hemmoroids, ? UTI- jr   Vital Signs:  Patient profile:   40 year old female Weight:      206.50 pounds BMI:     37.91 Temp:     98.3 degrees F oral Pulse rate:   68 / minute Pulse rhythm:   regular Resp:     18 per minute BP sitting:   126 / 96  (right arm) Cuff size:   large  Vitals Entered By: Glendell Docker CMA (February 07, 2009 8:51 AM)  Primary Care Provider:  Dondra Spry DO  CC:  Uti & Hemrrhoid evaluation and Dysuria.  History of Present Illness:  Dysuria      This is a 40 year old woman who presents with Dysuria.  The patient reports burning with urination, urinary frequency, and hematuria.  The patient denies the following associated symptoms: nausea, vomiting, fever, shaking chills, flank pain, and back pain.  Risk factors for urinary tract infection include diabetes.         The patient also presents with hemorrhoids.  The patient complains of pain with defecation, blood in stool, and straining, but denies abdominal pain and hematochezia.  The symptoms are worse with prolonged sitting.    Htn - stable.   DM II borderline - wt loss since prev visit.  Allergies: 1)  ! 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    Physical Exam  General:  alert, well-developed, and well-nourished.     Neck:  No deformities, masses, or tenderness noted. Lungs:  normal respiratory effort and normal breath sounds.   Heart:  normal rate, regular rhythm, no murmur, and no gallop.   Abdomen:  soft, non-tender, and normal bowel sounds.  no flank tenderness Rectal:  external hemorrhoid(s).   Extremities:  No lower extremity edema  Psych:  normally interactive and good eye contact.     Impression & Recommendations:  Problem # 1:  EXTERNAL HEMORRHOIDS WITHOUT MENTION COMP (ICD-455.3) Use anamantle crm two times a day x 1 week.   Local measures reviewed.   Sitz bath.  If no improvement, refer for hemorrhoid surgery.  Problem # 2:  UTI (ICD-599.0) No sign of pyelo.  Take abx as directed.  Patient advised to call office if symptoms persist or worsen.  Her updated medication list for this problem includes:    Ciprofloxacin Hcl 500 Mg Tabs (Ciprofloxacin hcl) ..... One by mouth two times a day  Orders: UA Dipstick w/o Micro (manual) ([address]) T-Culture, Urine ([id number]) Specimen Handling (99000)  Problem # 3:  DIABETES MELLITUS, TYPE II, BORDERLINE (ICD-790.29) Monitor labs.  She understands importance of lifestyle changes.  Her updated medication list for this problem includes:    Metformin Hcl 500 Mg Xr24h-tab (Metformin hcl) ..... One by mouth two times a day  Labs Reviewed: Creat: 0.8 (05/24/2008)     Problem #  4:  HYPERTENSION (ICD-401.9) Stable.  Maintain current medication regimen.  Her updated medication list for this problem includes:    Exforge 5-160 Mg Tabs (Amlodipine besylate-valsartan) ..... One tab by mouth once daily    Spironolactone 25 Mg Tabs (Spironolactone) .Marland Kitchen... Take 1 tablet by mouth once a day b  BP today: 126/96 Prior BP: 126/84 (09/12/2008)  Labs Reviewed: K+: 3.5 (05/24/2008) Creat: : 0.8 (05/24/2008)   Chol: 218 (04/22/2008)   HDL: 35.7 (04/22/2008)   LDL: 155 (04/22/2008)   TG: 138 (04/22/2008)  Complete Medication List: 1)  Freestyle Lite Test  Strp (Glucose blood) .... Use as directed.  patient tests once per day.  diagnosis code 250.02 2)  Freestyle Lancets Misc (Lancets) .... Use as directed. patient tests once per day.  diagnosis code 250.02 3)  Exforge 5-160 Mg Tabs (Amlodipine besylate-valsartan) .... One tab by mouth once daily 4)  Spironolactone 25 Mg Tabs (Spironolactone) .... Take 1 tablet by mouth once a day b 5)  Metformin Hcl 500 Mg Xr24h-tab (Metformin hcl) .... One by mouth two times a day 6)  Lidocaine-hydrocortisone Ace 3-0.5 % Crea (Lidocaine-hydrocortisone ace) .... Apply two times a day 7)  Ciprofloxacin Hcl 500 Mg Tabs (Ciprofloxacin hcl) .... One by mouth two times a day  Other Orders: T-Basic Metabolic Panel [id number]) T- Hemoglobin A1C [phone]) T-Lipid Profile [id number]) T-Hepatic Function [id number]) T-TSH [id number])  Patient Instructions: 1)  Use sitz bath once daily x 3-5 days. 2)  Wash rectal area with warm water and soap after bowel movement. 3)  Use colace 100 mg two times a day (over the counter) 4)  Please schedule a follow-up appointment in 3 months. 5)  Call our office if your symptoms do not  improve or gets worse. Prescriptions: CIPROFLOXACIN HCL 500 MG TABS (CIPROFLOXACIN HCL) one by mouth two times a day  #14 x 0   Entered and Authorized by:   D. Thomos Lemons DO   Signed by:   D. Thomos Lemons DO on 02/07/2009   Method used:   Electronically to        [facility] [address] [place] (retail)       [phone]       [place], [place]  [address]       Ph: [phone]       Fax: [phone]   RxID:   [id number] LIDOCAINE-HYDROCORTISONE ACE 3-0.5 % CREA (LIDOCAINE-HYDROCORTISONE ACE) apply two times a day  #1 week x 0   Entered and Authorized by:   D. Thomos Lemons DO   Signed by:   D. Thomos Lemons DO on 02/07/2009   Method used:   Electronically to        [facility] [address] [place] (retail)       [phone]       [place], [place]  [address]       Ph: [phone]       Fax: [phone]   RxID:   [id number]   Current Allergies (reviewed today): ! LISINOPRIL     Laboratory Results   Urine Tests    Routine Urinalysis   Color: straw Appearance: Hazy Glucose: negative   (Normal Range: Negative) Bilirubin: negative   (Normal Range: Negative) Ketone: negative   (Normal Range: Negative) Spec. Gravity: 1.020   (Normal Range: 1.003-1.035) Blood: large   (Normal Range: Negative) pH: 6.0   (Normal Range: 5.0-8.0) Protein: >=300   (  Normal Range: Negative) Urobilinogen: 0.2   (Normal Range: 0-1) Nitrite: negative   (Normal Range: Negative) Leukocyte Esterace: large   (Normal Range: Negative)

## 2010-06-18 NOTE — Letter (Signed)
Summary: Generic Letter  [facility]  [address] [place], [place] [address]   Phone: [phone]  Fax: [phone]        May 19, 2009 MRN: [id number]    Elysabeth Diener [address] [place], [place]  [address]    Dear Ms. Bagg,         Sincerely,  Lucious Groves  This letter has been electronically signed by your physician.

## 2010-06-18 NOTE — Assessment & Plan Note (Signed)
Summary: stress mom died/mhf rsc from Dr Artist Pais schedule/mhf   Vital Signs:  Patient profile:   40 year old female Menstrual status:  regular LMP:     04/09/2009 Height:      62 inches Weight:      199.75 pounds BMI:     36.67 O2 Sat:      97 % on Room air Temp:     98.6 degrees F oral Pulse rate:   88 / minute Pulse rhythm:   regular Resp:     12 per minute BP sitting:   138 / 94  (right arm) Cuff size:   regular  Vitals Entered By: Susan Whitaker (April 18, 2009 1:35 PM)  O2 Flow:  Room air  Primary Care Provider:  D. Thomos Lemons DO  CC:  c/o stress over mother's death.  History of Present Illness: Susan Whitaker is a 40 year old female who presents today following her mother's death 2023-04-01 from metastatic breast cancer.  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  Currently using FMLA- having trouble getting out of bed.   Can't sleep, panic attacks, not eating well.  Notes feeling overwhelmed.  Patient has seen Dr. Onalee Hua ([address])- Employee assistance program.  Had grief counseling yesterday- has another appointment next wednesday.   Denies former history of depression.  Allergies: 1)  ! Lisinopril  Review of Systems       eating small amounts,  + nausea, +diarrhea,  waking up sweating at night, nerves have been "real bad"  Physical Exam  General:  Crying continuously throughout exam Neck:  No deformities, masses, or tenderness noted. Lungs:  Normal respiratory effort, chest expands symmetrically. Lungs are clear to auscultation, no crackles or wheezes. Heart:  Normal rate and regular rhythm. S1 and S2 normal without gallop, murmur, click, rub or other extra sounds. Psych:  Pleasant but tearful and slightly anxious.     Impression & Recommendations:  Problem # 1:  GRIEF REACTION, ACUTE (ICD-309.0) Assessment New Will give trial of celexa for depression as well as as needed clonopin for associated panic attacks.  Hopefully these measures will be temporary  and patient can come off these medications in the next few months.  Pt instructed to keep counselling appointment with the EAP counselor next week and to contact Hospice for support. FMLA papers signed with plan for patient to return to work after the first of the year.  Complete Medication List: 1)  Freestyle Lite Test Strp (Glucose blood) .... Use as directed.  patient tests once per day.  diagnosis code 250.02 2)  Freestyle Lancets Misc (Lancets) .... Use as directed. patient tests once per day.  diagnosis code 250.02 3)  Exforge 5-160 Mg Tabs (Amlodipine besylate-valsartan) .... One tab by mouth once daily 4)  Spironolactone 25 Mg Tabs (Spironolactone) .... Take 1 tablet by mouth once a day b 5)  Metformin Hcl 500 Mg Xr24h-tab (Metformin hcl) .... One by mouth two times a day 6)  Ciprofloxacin Hcl 500 Mg Tabs (Ciprofloxacin hcl) .... One by mouth two times a day 7)  Celexa 20 Mg Tabs (Citalopram hydrobromide) .... Take one half tablet daily for 1 week, then increase to 1 tablet by mouth daily 8)  Klonopin 0.5 Mg Tabs (Clonazepam) .... One tablet by mouth three times a day as needed severe anxiety/panic attack  Patient Instructions: 1)  Please follow up with your EAP counselor as scheduled.  Please also contact hospice grief assistance for you  and your family.   2)  Please schedule a follow-up appointment in 1 month. 3)  Hang in there! Prescriptions: KLONOPIN 0.5 MG TABS (CLONAZEPAM) one tablet by mouth three times a day as needed severe anxiety/panic attack  #30 x 0   Entered and Authorized by:   Lemont Fillers FNP   Signed by:   Lemont Fillers FNP on 04/18/2009   Method used:   Print then Give to Patient   RxID:   [id number] CELEXA 20 MG TABS (CITALOPRAM HYDROBROMIDE) take one half tablet daily for 1 week, then increase to 1 tablet by mouth daily  #30 x 1   Entered and Authorized by:   Lemont Fillers FNP   Signed by:   Lemont Fillers FNP on 04/18/2009    Method used:   Electronically to        [facility] [address] (retail)       [phone]       [place], [place]  [address]       Ph: [phone]       Fax: [phone]   RxID:   [id number]   Current Allergies (reviewed today): ! LISINOPRIL

## 2010-06-18 NOTE — Assessment & Plan Note (Signed)
Summary: release for work/mhf   Vital Signs:  Patient profile:   40 year old female Menstrual status:  regular Height:      62 inches Weight:      202.50 pounds O2 Sat:      99 % on Room air Temp:     98.3 degrees F oral Pulse rate:   94 / minute BP sitting:   130 / 90  (right arm)  Vitals Entered By: Lucious Groves, CMA  O2 Flow:  Room air CC: Release for work. Nom complaints, pt did forget to take BP med today./kb   Primary Care Provider:  Dondra Spry DO  CC:  Release for work. Nom complaints and pt did forget to take BP med today./kb.  History of Present Illness: 40 y/o AA female for follow up.  pt reports depressive symptoms much better.  she stopped both clonazepam and citalopram  hyperlipidemia - she never received letter about her cholesterol.  she never started pravastatin  htn - she reports good med compliance  DM II borderline - minimal wt gain.  she reports taking metformin.  she does not have glucometer.  fair dietary compliance  Current Medications (verified): 1)  Freestyle Lite Test  Strp (Glucose Blood) .... Use As Directed.  Patient Tests Once Per Day.  Diagnosis Code 250.02 2)  Freestyle Lancets  Misc (Lancets) .... Use As Directed. Patient Tests Once Per Day.  Diagnosis Code 250.02 3)  Exforge 5-160 Mg Tabs (Amlodipine Besylate-Valsartan) .... One Tab By Mouth Once Daily 4)  Spironolactone 25 Mg Tabs (Spironolactone) .... Take 1 Tablet By Mouth Once A Day B 5)  Metformin Hcl 500 Mg Xr24h-Tab (Metformin Hcl) .... One By Mouth Two Times A Day 6)  Celexa 20 Mg Tabs (Citalopram Hydrobromide) .... Take One Half Tablet Daily For 1 Week, Then Increase To 1 Tablet By Mouth Daily 7)  Klonopin 0.5 Mg Tabs (Clonazepam) .... One Tablet By Mouth Three Times A Day As Needed Severe Anxiety/panic Attack 8)  Pravastatin Sodium 40 Mg Tabs (Pravastatin Sodium) .... One By Mouth Qpm  Allergies (verified): 1)  ! Lisinopril  Past History:  Past Medical  History: GERD Hypertension Hx of ulcer chicken pox Urinary incontinence  Diabetes mellitus, type II borderline Hyperlipidemia thrombocytosis  hypokalemia Allergic rhinitis  Family History: Family History Breast cancer 1st degree relative <50 Family History of CAD Female 1st degree relative <50 Family History Hypertension  Family History Lung cancer Family History of Stroke M 1st degree relative <50      Social History: Occupation: Public house manager at [employer]  Single  2 children       Review of Systems  The patient denies weight loss, weight gain, and chest pain.    Physical Exam  General:  alert and overweight-appearing.   Neck:  No deformities, masses, or tenderness noted.no carotid bruits.   Lungs:  normal respiratory effort and normal breath sounds.   Heart:  normal rate, regular rhythm, and no gallop.   Extremities:  trace left pedal edema and trace right pedal edema.   Psych:  normally interactive, good eye contact, not anxious appearing, and not depressed appearing.     Impression & Recommendations:  Problem # 1:  GRIEF REACTION, ACUTE (ICD-309.0) Assessment Improved resolved.  she stopped taking citalopram and clonazepam.   pt released to return to work.  no restrictions.  Problem # 2:  HYPERTENSION (ICD-401.9) stable.  Maintain current medication regimen.  Her updated medication list for this problem  includes:    Exforge 5-160 Mg Tabs (Amlodipine besylate-valsartan) ..... One tab by mouth once daily    Spironolactone 25 Mg Tabs (Spironolactone) .Marland Kitchen... Take 1 tablet by mouth once a day b  BP today: 130/90 Prior BP: 122/90 (04/25/2009)  Prior 10 Yr Risk Heart Disease: 5 % (04/25/2009)  Labs Reviewed: K+: 3.5 (04/25/2009) Creat: : 0.83 (04/25/2009)   Chol: 235 (04/25/2009)   HDL: 46 (04/25/2009)   LDL: 167 (04/25/2009)   TG: 110 (04/25/2009)  Problem # 3:  HYPERLIPIDEMIA (ICD-272.4) Goal LDL < 100.  start statin.  stressed importance of avoiding  saturated fats Her updated medication list for this problem includes:    Pravastatin Sodium 40 Mg Tabs (Pravastatin sodium) ..... One by mouth qpm  Labs Reviewed: SGOT: 11 (02/07/2009)   SGPT: 10 (02/07/2009)  Lipid Goals: Chol Goal: 200 (04/25/2009)   HDL Goal: 40 (04/25/2009)   LDL Goal: 130 (04/25/2009)   TG Goal: 150 (04/25/2009)  Prior 10 Yr Risk Heart Disease: 5 % (04/25/2009)   HDL:46 (04/25/2009), 38 (02/07/2009)  LDL:167 (04/25/2009), 143 (02/07/2009)  Chol:235 (04/25/2009), 205 (02/07/2009)  Trig:110 (04/25/2009), 122 (02/07/2009)  Complete Medication List: 1)  Freestyle Lite Test Strp (Glucose blood) .... Use as directed.  patient tests once per day.  diagnosis code 250.02 2)  Freestyle Lancets Misc (Lancets) .... Use as directed. patient tests once per day.  diagnosis code 250.02 3)  Exforge 5-160 Mg Tabs (Amlodipine besylate-valsartan) .... One tab by mouth once daily 4)  Spironolactone 25 Mg Tabs (Spironolactone) .... Take 1 tablet by mouth once a day b 5)  Metformin Hcl 500 Mg Xr24h-tab (Metformin hcl) .... One by mouth two times a day 6)  Pravastatin Sodium 40 Mg Tabs (Pravastatin sodium) .... One by mouth qpm  Patient Instructions: 1)  Please schedule a follow-up appointment in 3 months. 2)  BMP prior to visit, ICD-9:   401.9 3)  HbgA1C prior to visit, ICD-9: 790.29 4)  Hepatic Panel prior to visit, ICD-9: 272.4 5)  Lipid Panel prior to visit, ICD-9: 272.4 6)  Please return for lab work one (1) week before your next appointment.  7)  Please follow low saturated fat diet Prescriptions: METFORMIN HCL 500 MG XR24H-TAB (METFORMIN HCL) one by mouth two times a day  #60 x 5   Entered and Authorized by:   D. Thomos Lemons DO   Signed by:   D. Thomos Lemons DO on 05/19/2009   Method used:   Electronically to        [facility] [address] [name] (retail)       [phone]       [place], [place]  [address]       Ph: [phone]       Fax:  [phone]   RxID:   [id number] SPIRONOLACTONE 25 MG TABS (SPIRONOLACTONE) Take 1 tablet by mouth once a day b  #30 x 5   Entered and Authorized by:   D. Thomos Lemons DO   Signed by:   D. Thomos Lemons DO on 05/19/2009   Method used:   Electronically to        [facility] [address] (retail)       [phone]       [place], [place]  [address]       Ph: [phone]       Fax: [phone]   RxID:  [id number] EXFORGE 5-160 MG TABS (AMLODIPINE BESYLATE-VALSARTAN) one tab by mouth once daily  #30 x 5   Entered and Authorized by:   D. Thomos Lemons DO   Signed by:   D. Thomos Lemons DO on 05/19/2009   Method used:   Electronically to        [facility] [address] [place] (retail)       [phone]       [place], [place]  [address]       Ph: [phone]       Fax: [phone]   RxID:   [id number] PRAVASTATIN SODIUM 40 MG TABS (PRAVASTATIN SODIUM) one by mouth qpm  #30 x 3   Entered and Authorized by:   D. Thomos Lemons DO   Signed by:   D. Thomos Lemons DO on 05/19/2009   Method used:   Electronically to        [facility] [address] [place] (retail)       [phone]       [place], [place]  [address]       Ph: [phone]       Fax: [phone]   RxID:   [id number]

## 2010-06-18 NOTE — Progress Notes (Signed)
Summary: Exforge Samples  Phone Note Call from Patient Call back at Home Phone [phone]   Caller: Patient Summary of Call: patient called and left voice message stating she is unable to afford her medication for Exforge and would like to know if she could have some samples until her next office visit.  attempted to contact patient at [phone] number provided by patient, no answer-detailed voice messge left informing patient samples would be left at front desk for pick up at her convience Initial call taken by: Glendell Docker CMA,  November 25, 2008 4:04 PM

## 2010-06-18 NOTE — Assessment & Plan Note (Signed)
Summary: f/u   Vital Signs:  Patient Profile:   40 Years Old Female Height:     62 inches Weight:      209 pounds BMI:     38.36 O2 treatment:    Room Air Temp:     98.0 degrees F oral Pulse rate:   100 / minute Pulse rhythm:   regular Resp:     18 per minute BP sitting:   138 / 90  (right arm) Cuff size:   large  Vitals Entered By: Darra Lis RMA (May 24, 2008 10:07 AM)                 Visit Type:  follow up PCP:  Paulo Fruit MD  Chief Complaint:  follow up/diabetes.  History of Present Illness: Patient just had uterine fibroid embolization surgery done on 05/13/08.  She is following up with gyn about this issue.  Patient c/o PND and cough that starts at night. Sometimes she is able to cough up cream colored mucus.  No previous history of allergies.  no congestion, no sore throat, no headache, no other URI type symptoms.    patient with chronic, persistent hypokalemia - has referral pending to nephrology.  Is on supplements.  Patient with chronic thrombocytosis - will await nephrology referral and may consider hematology referral if needed.  patient with new diagnosis of diabetes - BGM running 90 - 140.  Has appointment this month with dietician.    patient with new diagnosis of hyperlipidemia.  she is trying to manage with diet / exercise and will be due for repeat labs in June    Updated Prior Medication List: LISINOPRIL 10 MG TABS (LISINOPRIL) one tab by mouth once daily FREESTYLE LITE TEST  STRP (GLUCOSE BLOOD) Use as directed.  patient tests once per day.  diagnosis code 250.02 FREESTYLE LANCETS  MISC (LANCETS) use as directed. patient tests once per day.  diagnosis code 250.02 POTASSIUM CHLORIDE CRYS CR 20 MEQ CR-TABS (POTASSIUM CHLORIDE CRYS CR) one by mouth once daily PERCOCET 5-325 MG TABS (OXYCODONE-ACETAMINOPHEN) take one tablet every 4 hours as needed [facility] IBUPROFEN 200 MG TABS (IBUPROFEN) two tablet every 8 hours  Current Allergies (reviewed  today): No known allergies   Past Medical History:    GERD    Hypertension    Hx of ulcer    chicken pox    Urinary incontinence    Diabetes mellitus, type II    Hyperlipidemia    thrombocytosis     hypokalemia    Allergic rhinitis  Past Surgical History:    Reviewed history from 05/03/2008 and no changes required:       abdominal surgery as a baby at 6 months - unsure of the surgery             Review of Systems       no nausea / vomiting / diarrhea / fever / chills / chest pain / SOB    Physical Exam  General:     Well-developed, well nourished, well hydrated, in no acute distress; appropriate and cooperative   Head:     Normocephalic and atraumatic without obvious abnormalities.  Eyes:     No corneal or conjunctival inflammation.  Vision grossly normal. Ears:     External ear shows no significant lesions or deformities.  Hearing is grossly normal. Nose:     External nasal examination shows no deformity or inflammation.  Neck:     supple, full ROM Lungs:  Normal respiratory effort, chest expands symmetrically with good air flow noted. Lungs clear to auscultation with no crackles, rales or wheezes.   Heart:     Normal rate and  rhythm. S1 and S2 normal, without gallop, murmur, click, rub  Extremities:     No clubbing, cyanosis, edema, or deformity noted, grossly normal full range of motion with all joints.   Neurologic:     alert & oriented X3,  gait normal.  no deficit in strength noted, no balance problems noted, neuro is grossly intact Skin:     Intact without suspicious lesions or rashes Psych:     Cognition and judgment appear intact. Alert and cooperative with normal attention span and concentration. No apparent delusions, illusions, hallucinations, normally interactive, good eye contact, not anxious or depressed appearing, and not agitated.       Impression & Recommendations:  Problem # 1:  HYPERTENSION (ICD-401.9) will increase lisinopril to  20mg  and follow up in one month. Her updated medication list for this problem includes:    Lisinopril 20 Mg Tabs (Lisinopril) ..... One tab by mouth once daily  Orders: TLB-BMP (Basic Metabolic Panel-BMET) (80048-METABOL)   Problem # 2:  HYPOKALEMIA (ICD-276.8) patient to continue with supplement and keep appointment with renal Orders: TLB-BMP (Basic Metabolic Panel-BMET) (80048-METABOL)   Problem # 3:  DIABETES MELLITUS, TYPE II (ICD-250.00) patient to continue to monitor BGM, attend appointment with dietician, bring BGM logbook to next appointment for review.  will hold off on meds for now per patient request - revisit this at next appointment in one month. Her updated medication list for this problem includes:    Lisinopril 20 Mg Tabs (Lisinopril) ..... One tab by mouth once daily  Orders: TLB-BMP (Basic Metabolic Panel-BMET) (80048-METABOL)   Problem # 4:  THROMBOCYTOSIS (ICD-289.9) persistent and unchanged  - may consider hematology referral - patient wants to hold off for now.  Problem # 5:  HYPERLIPIDEMIA (ICD-272.4) conitnue with diet / exercise and recheck in June  Problem # 6:  ENCOUNTER FOR LONG-TERM USE OF OTHER MEDICATIONS (ICD-V58.69)  Orders: TLB-BMP (Basic Metabolic Panel-BMET) (80048-METABOL)   Complete Medication List: 1)  Lisinopril 20 Mg Tabs (Lisinopril) .... One tab by mouth once daily 2)  Freestyle Lite Test Strp (Glucose blood) .... Use as directed.  patient tests once per day.  diagnosis code 250.02 3)  Freestyle Lancets Misc (Lancets) .... Use as directed. patient tests once per day.  diagnosis code 250.02 4)  Potassium Chloride Crys Cr 20 Meq Cr-tabs (Potassium chloride crys cr) .... One by mouth once daily 5)  Percocet 5-325 Mg Tabs (Oxycodone-acetaminophen) .... Take one tablet every 4 hours as needed 6)  [facility] Ibuprofen 200 Mg Tabs (Ibuprofen) .... Two tablet every 8 hours   Patient Instructions: 1)  Please schedule a follow-up appointment in 1  month.   Prescriptions: LISINOPRIL 20 MG TABS (LISINOPRIL) one tab by mouth once daily  #30 x 1   Entered and Authorized by:   Paulo Fruit MD   Signed by:   Paulo Fruit MD on 05/24/2008   Method used:   Electronically to        [facility] [address] (retail)       [phone]       [place], [place]  [address]       Ph: [phone]       Fax: [phone]   RxID:   [id number]

## 2010-06-18 NOTE — Progress Notes (Signed)
Summary: Phone note  Phone Note Outgoing Call   Summary of Call: Please call patient and make a follow up appointment with me (not just a lab appt)  for either this week - wednesday or thursday or next week.   We need to review diabetes, etc.  Thanks Initial call taken by: Paulo Fruit MD,  May 13, 2008 9:46 AM  Follow-up for Phone Call        Message left on patient's voicemail to make an appointment. Follow-up by: Darra Lis RMA,  May 13, 2008 10:42 AM  Additional Follow-up for Phone Call Additional follow up Details #1::        Called patient's home number/no answer and no answering machine. Called patient's work number and left a message for patient to return phone call. Additional Follow-up by: Darra Lis RMA,  May 13, 2008 2:05 PM    Additional Follow-up for Phone Call Additional follow up Details #2::    Patient has a scheduled appointment in January 2010. Follow-up by: Darra Lis RMA,  May 14, 2008 9:00 AM

## 2010-06-18 NOTE — Progress Notes (Signed)
Summary: Phone note  Phone Note Outgoing Call   Summary of Call: call pt - potassium is improved.   Keep taking potassium.   I suggest we repeat BMET in 2 weeks.  Have pt return for blood work in 2 weeks Initial call taken by: D. Thomos Lemons DO,  May 08, 2008 5:57 PM  Follow-up for Phone Call        Patient advised per Dr. Olegario Messier instructions. Follow-up by: Darra Lis RMA,  May 09, 2008 10:14 AM    New/Updated Medications: POTASSIUM CHLORIDE CRYS CR 20 MEQ CR-TABS (POTASSIUM CHLORIDE CRYS CR) one by mouth once daily   Prescriptions: POTASSIUM CHLORIDE CRYS CR 20 MEQ CR-TABS (POTASSIUM CHLORIDE CRYS CR) one by mouth once daily  #30 x 2   Entered and Authorized by:   D. Thomos Lemons DO   Signed by:   D. Thomos Lemons DO on 05/08/2008   Method used:   Electronically to        [facility] [address] (retail)       [phone]       [place], [place]  [address]       Ph: [phone]       Fax: [phone]   RxID:   [id number]

## 2011-01-08 ENCOUNTER — Ambulatory Visit (INDEPENDENT_AMBULATORY_CARE_PROVIDER_SITE_OTHER): Payer: 59 | Admitting: Family

## 2011-01-08 ENCOUNTER — Encounter: Payer: Self-pay | Admitting: Family

## 2011-01-08 ENCOUNTER — Telehealth: Payer: Self-pay | Admitting: Family

## 2011-01-08 ENCOUNTER — Ambulatory Visit (HOSPITAL_BASED_OUTPATIENT_CLINIC_OR_DEPARTMENT_OTHER)
Admission: RE | Admit: 2011-01-08 | Discharge: 2011-01-08 | Disposition: A | Discharge status: Home / Self Care | Payer: 59 | Source: Ambulatory Visit | Attending: Family | Admitting: Family

## 2011-01-08 VITALS — BP 150/98 | HR 72 | Temp 99.8°F | Resp 16 | Ht 62.0 in | Wt 211.0 lb

## 2011-01-08 DIAGNOSIS — D259 Leiomyoma of uterus, unspecified: Secondary | ICD-10-CM | POA: Insufficient documentation

## 2011-01-08 DIAGNOSIS — Z0189 Encounter for other specified special examinations: Secondary | ICD-10-CM

## 2011-01-08 DIAGNOSIS — R109 Unspecified abdominal pain: Secondary | ICD-10-CM | POA: Insufficient documentation

## 2011-01-08 DIAGNOSIS — R319 Hematuria, unspecified: Secondary | ICD-10-CM | POA: Insufficient documentation

## 2011-01-08 DIAGNOSIS — R3129 Other microscopic hematuria: Secondary | ICD-10-CM

## 2011-01-08 DIAGNOSIS — I10 Essential (primary) hypertension: Secondary | ICD-10-CM

## 2011-01-08 LAB — POCT URINALYSIS DIPSTICK
Bilirubin, UA: NEGATIVE
Glucose, UA: NEGATIVE
Ketones, UA: NEGATIVE
Leukocytes, UA: NEGATIVE
Nitrite, UA: NEGATIVE
Spec Grav, UA: 1.005
Urobilinogen, UA: 0.2
pH, UA: 7.5

## 2011-01-08 MED ORDER — LOSARTAN POTASSIUM 50 MG PO TABS: 50.0000 mg | ORAL_TABLET | Freq: Every day | ORAL | Status: DC

## 2011-01-08 MED ORDER — CIPROFLOXACIN HCL 500 MG PO TABS: 500.0000 mg | ORAL_TABLET | Freq: Two times a day (BID) | ORAL | Status: AC

## 2011-01-08 NOTE — Patient Instructions (Signed)
Please start cipro this afternoon.  Complete your CT scan on the first floor.  Follow up in 1 week, sooner if symptoms worsen or if they do not improve.

## 2011-01-08 NOTE — Telephone Encounter (Signed)
Spoke with pt. Reviewed CT results and recommended that she start Cipro.  Use Ibuprofen PRN pain.  Pt verbalizes understanding.

## 2011-01-08 NOTE — Assessment & Plan Note (Signed)
Urine + blood.  Low grade fever, + CVAT.  Will refer for CT to exclude stone and will also treat with cipro, culture urine.  Pt is instructed to go to the ED if she develops fever >101, nausea, vomitting, worsening hematuria or worsening flank pain. She verbalizes understanding.

## 2011-01-08 NOTE — Progress Notes (Signed)
  Subjective:    Patient ID: Susan Whitaker, female    DOB: [birth year], 40 y.o.   MRN: [id number]  HPI  Ms.  Susan Whitaker is a 40 yr old female who presents today with chief complaint of left sided low back pain.   1) Flank pain- Started 2 days ago. Rates pain as moderate 3-4/10, constant.  She reports + associated chills and an episode of hematuria which she describes as "pink on the tissue."  She denies known back injury. Denies history of kidney stones, though notes that her dad and grandmother have hx of kindney stones.   2) HTN- Patient has been treated for Chronic HTN for quite some time. She is currently on Losartan and spironolactone,  and suboptimally controlled. No associated S/S related to HTN.   Quality: chronic Modifying factor: meds Duration: Quite sometime Associated S/S: None.  The patient denies the following associated symptoms: Chest pain, dyspnea, blurred vision, headache, or lower extremity edema.   Review of Systems See HPI No past medical history on file.  History   Social History  . Marital Status: Single    Spouse Name: N/A    Number of Children: N/A  . Years of Education: N/A   Occupational History  . Not on file.   Social History Main Topics  . Smoking status: Never Smoker   . Smokeless tobacco: Never Used  . Alcohol Use: No  . Drug Use: No  . Sexually Active: Not on file   Other Topics Concern  . Not on file   Social History Narrative  . No narrative on file    No past surgical history on file.  No family history on file.  Allergies  Allergen Reactions  . Lisinopril     REACTION: ACE cough    No current outpatient prescriptions on file prior to visit.    BP 150/98  Pulse 72  Temp(Src) 99.8 F (37.7 C) (Oral)  Resp 16  Ht 5\' 2"  (1.575 m)  Wt 211 lb (95.709 kg)  BMI 38.59 kg/m2  LMP 12/27/2010       Objective:   Physical Exam  Constitutional: She is oriented to person, place, and time. She appears well-developed and  well-nourished. No distress.  Cardiovascular: Normal rate and regular rhythm.  Exam reveals no gallop.   No murmur heard. Pulmonary/Chest: Effort normal and breath sounds normal. No respiratory distress. She has no wheezes. She has no rales. She exhibits no tenderness.  Musculoskeletal: She exhibits no edema.  Neurological: She is alert and oriented to person, place, and time. She has normal reflexes.  Psychiatric: She has a normal mood and affect. Her behavior is normal. Judgment and thought content normal.  GU- + left sided CVAT Abdomen- soft, non-tender, non-distended, + bowel sounds.         Assessment & Plan:

## 2011-01-08 NOTE — Assessment & Plan Note (Signed)
BP Readings from Last 3 Encounters:  01/08/11 150/98  04/28/10 144/98  03/05/10 144/90    Pt is noted to have elevated BP today perhaps due to pain. Will plan to repeat in 1 week and will adjust BP meds accordingly.  She was instructed not to become pregnant while on cozaar due to risk of harm to the fetus and she verbalizes understanding.

## 2011-01-10 LAB — URINE CULTURE: Colony Count: 75000

## 2011-04-21 ENCOUNTER — Encounter: Payer: Self-pay | Admitting: Family

## 2011-04-21 ENCOUNTER — Ambulatory Visit (INDEPENDENT_AMBULATORY_CARE_PROVIDER_SITE_OTHER): Payer: 59 | Admitting: Family

## 2011-04-21 DIAGNOSIS — H109 Unspecified conjunctivitis: Secondary | ICD-10-CM

## 2011-04-21 MED ORDER — CIPROFLOXACIN HCL 0.3 % OP SOLN: 1.0000 [drp] | OPHTHALMIC | Status: AC

## 2011-04-21 NOTE — Progress Notes (Signed)
  Subjective:    Patient ID: Susan Whitaker, female    DOB: 1970-08-11, 40 y.o.   MRN: [id number]  HPI  [name] is a 40 yr old female who presents today with chief complaint of redness and white discharge from the left eye.  Denies visual changes in the left eye except for sensation of a "film over my eye."  Symptoms started this AM.    Review of Systems    see HPI Objective:   Physical Exam  Constitutional: She appears well-developed and well-nourished.  HENT:  Right Ear: Tympanic membrane and ear canal normal.  Left Ear: Tympanic membrane and ear canal normal.  Mouth/Throat: No posterior oropharyngeal edema or posterior oropharyngeal erythema.  Eyes: Pupils are equal, round, and reactive to light. Left eye exhibits discharge. Right conjunctiva is not injected. Left conjunctiva is injected.    Cardiovascular: Normal rate and regular rhythm.   No murmur heard. Pulmonary/Chest: Effort normal and breath sounds normal. No respiratory distress. She has no wheezes. She has no rales. She exhibits no tenderness.          Assessment & Plan:

## 2011-04-21 NOTE — Assessment & Plan Note (Signed)
Rx with ciloxan.  She is instructed to book a fasting physical in January.

## 2011-04-21 NOTE — Patient Instructions (Signed)
Bacterial Conjunctivitis Conjunctivitis is an irritation (inflammation) of the clear membrane that covers the white part of the eye (conjunctiva). The irritation can also happen on the underside of the eyelids. Conjunctivitis makes the eye red or pink in color. This is what is commonly known as pink eye. CAUSES   Infection from a germ (bacteria) on the surface of the eye.   Infection from the irritation or injury of nearby tissues such as the eyelids or cornea.   More serious inflammation or infection on the inside of the eye.   Other eye diseases.   The use of certain eye medications.  SYMPTOMS  The normally white color of the eye or the underside of the eyelid is usually pink or red in color. The pink eye is usually associated with irritation, tearing and some sensitivity to light. Bacterial conjunctivitis is often associated with a thick, yellowish discharge from the eye. If a discharge is present, there may also be some blurred vision in the affected eye. DIAGNOSIS  Conjunctivitis is diagnosed by an eye exam. The eye specialist looks for changes in the surface tissues of the eye which take on changes that point to the specific type of conjunctivitis. A sample of any discharge may be collected on a Q-Tip (sterile swap). The sample will be sent to a lab to see whether or not the inflammation is caused by bacterial or viral infection. TREATMENT  Bacterial conjunctivitis is treated with medicines that kill germs (antibiotics). Drops are most often used. However, antibiotic ointments are available and may be preferred by some patients. Antibiotics by mouth (oral) are sometimes used. Artificial tears or eye washes may ease discomfort. HOME CARE INSTRUCTIONS   To ease discomfort, apply a cool, clean wash cloth to the eye for 10 to 20 minutes, 3 to 4 times a day.   Gently wipe away any drainage from the eye with a warm, wet washcloth or a cotton ball.   Wash your hands often with soap. Use paper  towels to dry.   Do not share towels or wash cloths. This may spread the infection.   Change or wash your pillow case every day.   You should not use eye make-up until the infection is gone.   Do not operate machinery or drive if vision is blurred.   Stop using contacts lenses. Ask your eye professional how to sterilize or replace them before using again. This depends on the type of contact lenses used.   Do not touch the edge of the eyelid with the eye drop bottle or ointment tube when applying medications to the affected eye. This will stop you from spreading the infection to the other eye or to others. Do as your caregiver tell you.  SEEK IMMEDIATE MEDICAL CARE IF:   The infection has not improved within 3 days of beginning treatment.   A yellow discharge from the eye develops.   Pain in the eye increases.   The redness is spreading.   Vision becomes blurred.   An oral temperature above 102 F (38.9 C) develops, or as your caregiver suggests.   Facial pain, redness or swelling develops.   Any problems that may be related to the prescribed medicine develops.  MAKE SURE YOU:   Understand these instructions.   Will watch your condition.   Will get help right away if you are not doing well or get worse.  Document Released: 05/03/2005 Document Revised: 01/13/2011 Document Reviewed: 12/21/2007 [facility] Patient Information 2012 [place], [place].

## 2011-05-07 ENCOUNTER — Telehealth: Payer: Self-pay | Admitting: Family

## 2011-05-07 MED ORDER — LOSARTAN POTASSIUM 50 MG PO TABS: 50.0000 mg | ORAL_TABLET | Freq: Every day | ORAL | Status: DC

## 2011-05-07 NOTE — Telephone Encounter (Signed)
Rx refill sent to pharmacy. 

## 2011-05-07 NOTE — Telephone Encounter (Signed)
LOSARTIN REFILL TO [facility] ON [address]

## 2011-05-13 ENCOUNTER — Ambulatory Visit (INDEPENDENT_AMBULATORY_CARE_PROVIDER_SITE_OTHER): Payer: 59 | Admitting: Internal Medicine

## 2011-05-13 ENCOUNTER — Ambulatory Visit: Payer: 59 | Admitting: Internal Medicine

## 2011-05-13 ENCOUNTER — Encounter: Payer: Self-pay | Admitting: Internal Medicine

## 2011-05-13 VITALS — BP 140/90 | HR 97 | Temp 98.4°F | Resp 18 | Wt 210.0 lb

## 2011-05-13 DIAGNOSIS — M25519 Pain in unspecified shoulder: Secondary | ICD-10-CM

## 2011-05-13 DIAGNOSIS — IMO0001 Reserved for inherently not codable concepts without codable children: Secondary | ICD-10-CM

## 2011-05-13 DIAGNOSIS — K5289 Other specified noninfective gastroenteritis and colitis: Secondary | ICD-10-CM

## 2011-05-13 DIAGNOSIS — M791 Myalgia, unspecified site: Secondary | ICD-10-CM | POA: Insufficient documentation

## 2011-05-13 DIAGNOSIS — Z20828 Contact with and (suspected) exposure to other viral communicable diseases: Secondary | ICD-10-CM

## 2011-05-13 DIAGNOSIS — K529 Noninfective gastroenteritis and colitis, unspecified: Secondary | ICD-10-CM | POA: Insufficient documentation

## 2011-05-13 LAB — POCT INFLUENZA A/B
Influenza A, POC: NEGATIVE
Influenza B, POC: NEGATIVE

## 2011-05-13 MED ORDER — KETOROLAC TROMETHAMINE 30 MG/ML IJ SOLN
30.0000 mg | Freq: Once | INTRAMUSCULAR | Status: AC
  Administered 2011-05-13: 30 mg via INTRAMUSCULAR

## 2011-05-13 MED ORDER — CYCLOBENZAPRINE HCL 5 MG PO TABS: 5.0000 mg | ORAL_TABLET | Freq: Three times a day (TID) | ORAL | Status: AC | PRN

## 2011-05-13 MED ORDER — PROMETHAZINE HCL 25 MG PO TABS: 25.0000 mg | ORAL_TABLET | Freq: Four times a day (QID) | ORAL | Status: AC | PRN

## 2011-05-13 NOTE — Assessment & Plan Note (Signed)
Given toradol im injxn. Attempt flexeril prn and cautioned re possible sedating effect. Followup if no improvement or worsening.

## 2011-05-13 NOTE — Assessment & Plan Note (Signed)
Attempt phenergan prn and cautioned re possible sedating effect. Maintain adequate po fluid intake. Flu swab neg. Followup if no improvement or worsening.

## 2011-05-13 NOTE — Progress Notes (Signed)
  Subjective:    Patient ID: Susan Whitaker, female    DOB: 1970/09/02, 40 y.o.   MRN: [id number]  HPI Pt presents to clinic for evaluation of multiple complaints. Notes one day h/o n/v/d as well as subjective fever/chills. No emesis today. +sick exposures. Also notes left lateral neck, shoulder, trapezium and left lumbar paraspinal muscle pain and tenderness. Occurred 12/17 after picked up video panel and turned. Taking no medication for the problem. Worsens with movement. No other alleviating or exacerbating factors. No other complaints.  No past medical history on file. No past surgical history on file.  reports that she has never smoked. She has never used smokeless tobacco. She reports that she does not drink alcohol or use illicit drugs. family history is not on file. Allergies  Allergen Reactions  . Lisinopril     REACTION: ACE cough     Review of Systems see hpi     Objective:   Physical Exam  Nursing note and vitals reviewed. Constitutional: She appears well-developed and well-nourished. No distress.  HENT:  Head: Normocephalic and atraumatic.  Right Ear: External ear normal.  Left Ear: External ear normal.  Nose: Nose normal.  Mouth/Throat: Oropharynx is clear and moist.  Eyes: Conjunctivae are normal. No scleral icterus.  Abdominal: Soft. Bowel sounds are normal. She exhibits no distension. There is no tenderness.  Musculoskeletal:       +tenderness along muscle distributions including left scm, trapezius, and upper lumbar paraspinal muscle. FROM noted. Gait nl  Neurological: She is alert.  Skin: Skin is warm and dry. She is not diaphoretic.  Psychiatric: She has a normal mood and affect.          Assessment & Plan:

## 2011-05-24 ENCOUNTER — Telehealth: Payer: Self-pay | Admitting: Family

## 2011-05-24 NOTE — Telephone Encounter (Signed)
Patient seen on Dec 27   still have left arm pain  Please call

## 2011-05-25 NOTE — Telephone Encounter (Signed)
Left message on voicemail to return my call.  

## 2011-05-26 NOTE — Telephone Encounter (Signed)
Spoke with pt and advised follow up for further assesment. Appt, scheduled for 05/28/11 at 2:15pm.

## 2011-05-28 ENCOUNTER — Telehealth: Payer: Self-pay | Admitting: Family

## 2011-05-28 ENCOUNTER — Ambulatory Visit (INDEPENDENT_AMBULATORY_CARE_PROVIDER_SITE_OTHER): Payer: 59 | Admitting: Family

## 2011-05-28 ENCOUNTER — Ambulatory Visit (HOSPITAL_BASED_OUTPATIENT_CLINIC_OR_DEPARTMENT_OTHER)
Admission: RE | Admit: 2011-05-28 | Discharge: 2011-05-28 | Disposition: A | Discharge status: Home / Self Care | Payer: 59 | Source: Ambulatory Visit | Attending: Family | Admitting: Family

## 2011-05-28 ENCOUNTER — Encounter: Payer: Self-pay | Admitting: Family

## 2011-05-28 ENCOUNTER — Other Ambulatory Visit: Payer: Self-pay | Admitting: Family

## 2011-05-28 VITALS — BP 128/82 | HR 84 | Temp 98.0°F | Resp 16 | Ht 62.0 in | Wt 217.0 lb

## 2011-05-28 DIAGNOSIS — M79609 Pain in unspecified limb: Secondary | ICD-10-CM | POA: Insufficient documentation

## 2011-05-28 DIAGNOSIS — M542 Cervicalgia: Secondary | ICD-10-CM | POA: Insufficient documentation

## 2011-05-28 DIAGNOSIS — R7309 Other abnormal glucose: Secondary | ICD-10-CM

## 2011-05-28 DIAGNOSIS — Z0189 Encounter for other specified special examinations: Secondary | ICD-10-CM

## 2011-05-28 DIAGNOSIS — Z1231 Encounter for screening mammogram for malignant neoplasm of breast: Secondary | ICD-10-CM

## 2011-05-28 DIAGNOSIS — R21 Rash and other nonspecific skin eruption: Secondary | ICD-10-CM

## 2011-05-28 LAB — POCT URINE PREGNANCY: Preg Test, Ur: NEGATIVE

## 2011-05-28 MED ORDER — HYDROCODONE-ACETAMINOPHEN 5-500 MG PO TABS: 1.0000 | ORAL_TABLET | Freq: Three times a day (TID) | ORAL | Status: AC | PRN

## 2011-05-28 MED ORDER — NYSTATIN 100000 UNIT/GM EX POWD: CUTANEOUS | Status: DC

## 2011-05-28 MED ORDER — HYDROCODONE-ACETAMINOPHEN 10-500 MG PO TABS: 1.0000 | ORAL_TABLET | Freq: Four times a day (QID) | ORAL | Status: DC | PRN

## 2011-05-28 MED ORDER — GLUCOSE BLOOD VI STRP: ORAL_STRIP | Status: DC

## 2011-05-28 MED ORDER — METHYLPREDNISOLONE 4 MG PO KIT: PACK | ORAL | Status: AC

## 2011-05-28 NOTE — Assessment & Plan Note (Signed)
41 yr old female with 3.5 week hx of neck pain which is radiating down the left arm.  Worsening.  No improvement with flexeril or ibuprofen.  We discussed obtaining MRI and she would like to start with x-ray and additional med treatment first.  Will rx medrol dose pak, prn vicodin (she was cautioned not to drive after taking vicodin due to possible sedation).  Obtain x-ray of C. Spine today.

## 2011-05-28 NOTE — Progress Notes (Signed)
  Subjective:    Patient ID: Susan Whitaker, female    DOB: 10/24/1970, 41 y.o.   MRN: [id number]  HPI Ms.  Olmsted is a 41 yr old female who presents today to discuss neck pain.  She reports that symptoms started suddenly on 12/17.  Turned her head "a certain way. It felt like a crook in my neck", reports that her pain is 12/10.  Not sleeping well.  Has tried flexeril without improvement.  Made her drowsy but didn't help her pain.  Pain starts at the top of the left shoulder and radiates down left arm.  Notes some associated weakness in the left arm as well as numbness/tingling.  She notes that pain is worsening.  She has tried OTC "pain pills"  -ibuprofen, without improvement.    Review of Systems See HPI  No past medical history on file.  History   Social History  . Marital Status: Single    Spouse Name: N/A    Number of Children: N/A  . Years of Education: N/A   Occupational History  . Not on file.   Social History Main Topics  . Smoking status: Never Smoker   . Smokeless tobacco: Never Used  . Alcohol Use: No  . Drug Use: No  . Sexually Active: Not on file   Other Topics Concern  . Not on file   Social History Narrative  . No narrative on file    No past surgical history on file.  No family history on file.  Allergies  Allergen Reactions  . Lisinopril     REACTION: ACE cough    Current Outpatient Prescriptions on File Prior to Visit  Medication Sig Dispense Refill  . losartan (COZAAR) 50 MG tablet Take 1 tablet (50 mg total) by mouth daily.  30 tablet  5  . lovastatin (MEVACOR) 20 MG tablet Take 20 mg by mouth every evening.        . metFORMIN (GLUCOPHAGE) 500 MG tablet Take 500 mg by mouth 2 (two) times daily with a meal.        . spironolactone (ALDACTONE) 25 MG tablet Take 25 mg by mouth daily.        Marland Kitchen glucose blood (FREESTYLE LITE) test strip by Other route. Use to test blood sugar once a day.       . Lancets (FREESTYLE) lancets Use to check blood sugar  once a day as instructed         BP 128/82  Pulse 84  Temp(Src) 98 F (36.7 C) (Oral)  Resp 16  Ht 5\' 2"  (1.575 m)  Wt 217 lb 0.6 oz (98.449 kg)  BMI 39.70 kg/m2       Objective:   Physical Exam  Constitutional: She appears well-developed and well-nourished.       Uncomfortable appearing AA female, awake and alert.   HENT:  Head: Normocephalic and atraumatic.  Eyes: No scleral icterus.  Cardiovascular: Normal rate and regular rhythm.   No murmur heard. Pulmonary/Chest: Effort normal and breath sounds normal. No respiratory distress. She has no wheezes. She has no rales. She exhibits no tenderness.  Neurological:       Bilateral upper extrem strength is 5/5, strong equal hand grasps bilaterally.   Psychiatric: She has a normal mood and affect. Her speech is normal and behavior is normal. Judgment and thought content normal. Cognition and memory are normal.          Assessment & Plan:

## 2011-05-28 NOTE — Telephone Encounter (Addendum)
Reviewed x-ray- shows mild degenerative changes no major problems seen.  Hopefully the medrol will help her.  Tried cell number, message box full, unable to leave message. Also sent nystatin powder to her pharmacy for rash under her arms. Please call pt on Monday.

## 2011-05-28 NOTE — Assessment & Plan Note (Addendum)
She was instructed to check her sugar twice daily while on medrol and to call us if sugar >300.  She verbalizes understanding. She was given a Lifestyle freedom lite meter sample today.

## 2011-05-28 NOTE — Patient Instructions (Addendum)
Please complete your x-ray on the first floor.  Check your sugars twice daily while on medrol- call if sugar >300. Follow up in 2 weeks.

## 2011-05-31 NOTE — Telephone Encounter (Signed)
Notified pt. She states her neck is still painful even though she is taking Vicodin. States the Vicodin only reduces her pain level to 6. The pain is limiting her activities. Pt states she will continue the dose pack and give it a few more days but says she hasn't had any improvement of her symptoms yet. Advised pt to f/u as scheduled or call if symptoms worsen.

## 2011-06-11 ENCOUNTER — Encounter: Payer: Self-pay | Admitting: Family

## 2011-06-11 ENCOUNTER — Ambulatory Visit: Payer: 59 | Admitting: Family

## 2011-06-11 ENCOUNTER — Encounter: Payer: Self-pay | Admitting: *Deleted

## 2011-06-14 ENCOUNTER — Ambulatory Visit: Payer: 59 | Admitting: Family

## 2011-06-14 DIAGNOSIS — Z0289 Encounter for other administrative examinations: Secondary | ICD-10-CM

## 2011-06-16 ENCOUNTER — Ambulatory Visit (HOSPITAL_BASED_OUTPATIENT_CLINIC_OR_DEPARTMENT_OTHER): Payer: 59

## 2011-06-22 ENCOUNTER — Ambulatory Visit (HOSPITAL_BASED_OUTPATIENT_CLINIC_OR_DEPARTMENT_OTHER)
Admission: RE | Admit: 2011-06-22 | Discharge: 2011-06-22 | Disposition: A | Discharge status: Home / Self Care | Payer: 59 | Source: Ambulatory Visit | Attending: Family | Admitting: Family

## 2011-06-22 DIAGNOSIS — Z1231 Encounter for screening mammogram for malignant neoplasm of breast: Secondary | ICD-10-CM

## 2011-07-05 ENCOUNTER — Ambulatory Visit: Payer: 59 | Admitting: Family

## 2011-07-23 ENCOUNTER — Ambulatory Visit: Payer: Self-pay | Admitting: Obstetrics and Gynecology

## 2011-08-06 ENCOUNTER — Ambulatory Visit: Payer: Self-pay | Admitting: Obstetrics and Gynecology

## 2011-08-20 ENCOUNTER — Ambulatory Visit: Payer: Self-pay | Admitting: Obstetrics and Gynecology

## 2011-08-30 ENCOUNTER — Ambulatory Visit (INDEPENDENT_AMBULATORY_CARE_PROVIDER_SITE_OTHER): Payer: 59 | Admitting: Obstetrics and Gynecology

## 2011-08-30 ENCOUNTER — Encounter: Payer: Self-pay | Admitting: Obstetrics and Gynecology

## 2011-08-30 VITALS — BP 140/82 | HR 82 | Ht 63.0 in | Wt 212.0 lb

## 2011-08-30 DIAGNOSIS — Z1329 Encounter for screening for other suspected endocrine disorder: Secondary | ICD-10-CM

## 2011-08-30 DIAGNOSIS — Z01419 Encounter for gynecological examination (general) (routine) without abnormal findings: Secondary | ICD-10-CM

## 2011-08-30 DIAGNOSIS — D219 Benign neoplasm of connective and other soft tissue, unspecified: Secondary | ICD-10-CM

## 2011-08-30 DIAGNOSIS — D259 Leiomyoma of uterus, unspecified: Secondary | ICD-10-CM

## 2011-08-30 DIAGNOSIS — N926 Irregular menstruation, unspecified: Secondary | ICD-10-CM

## 2011-08-30 DIAGNOSIS — Z124 Encounter for screening for malignant neoplasm of cervix: Secondary | ICD-10-CM

## 2011-08-30 DIAGNOSIS — Z130 Encounter for screening for diseases of the blood and blood-forming organs and certain disorders involving the immune mechanism: Secondary | ICD-10-CM

## 2011-08-30 DIAGNOSIS — N921 Excessive and frequent menstruation with irregular cycle: Secondary | ICD-10-CM

## 2011-08-30 LAB — CBC
HCT: 39.3 % (ref 36.0–46.0)
Hemoglobin: 12.8 g/dL (ref 12.0–15.0)
MCH: 27.5 pg (ref 26.0–34.0)
MCHC: 32.6 g/dL (ref 30.0–36.0)
MCV: 84.3 fL (ref 78.0–100.0)
Platelets: 538 10*3/uL — ABNORMAL HIGH (ref 150–400)
RBC: 4.66 MIL/uL (ref 3.87–5.11)
RDW: 14.2 % (ref 11.5–15.5)
WBC: 9.3 10*3/uL (ref 4.0–10.5)

## 2011-08-30 LAB — TSH: TSH: 1.975 u[IU]/mL (ref 0.350–4.500)

## 2011-08-30 LAB — POCT URINE PREGNANCY: Preg Test, Ur: NEGATIVE

## 2011-08-30 NOTE — Patient Instructions (Signed)
Keep your appointment for your ultrasound to evaluate fibroids

## 2011-08-30 NOTE — Progress Notes (Signed)
Subjective:    Susan Whitaker is a 41 y.o. female, [id number], who presents for an annual exam. The patient reports two periods monthly for the past 3 months.  Flow is for 5 days with pad change every 3-4 hours. Admits to cramping 6/10 but will decrease to 3/10 with aspirin.  Also has been asked if she's pregnant during these bleeding episodes.Denies change in BM, problems passing urine, bloating  or dyspareuenia.  The "swelling" of abdomen will last days during the bleeding episodes.    History   Social History  . Marital Status: Single    Spouse Name: N/A    Number of Children: 2  . Years of Education: N/A   Occupational History  . 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 Smoking status: Never Smoker   . Smokeless tobacco: Never Used  . Alcohol Use: No  . Drug Use: No  . Sexually Active: Yes -- Female partner(s)    Birth Control/ Protection: None   Other Topics Concern  . None   Social History Narrative  . None     Last pap: was normal  2012    Menstrual cycle:   LMP: Patient's last menstrual period was 07/30/2011.   Review of Systems Pertinent items are noted in HPI. Denies pelvic pain, uti symptoms, vaginitis symptoms,  menopausal symptoms or rectal bleeding   Objective:    BP 140/82  Pulse 82  Ht 5\' 3"  (1.6 m)  Wt 212 lb (96.163 kg)  BMI 37.55 kg/m2  LMP 07/30/2011 Weight:  Wt Readings from Last 1 Encounters:  08/30/11 212 lb (96.163 kg)   BMI: Body mass index is 37.55 kg/(m^2). General Appearance: Alert, appropriate appearance for age. No acute distress HEENT: Grossly normal Neck / Thyroid: Supple, no masses, nodes or enlargement Lungs: clear to auscultation bilaterally Back: No CVA tenderness Breast Exam: No masses or nodes.No dimpling, nipple retraction or discharge. Cardiovascular: Regular rate and rhythm. S1, S2, no murmur Gastrointestinal: Soft, non-tender, no masses or organomegaly Pelvic Exam: EGBUS-wnl, vagina-rugous, cervix without  lesions/posterior, uterus approx. 10 weeks size, adnexae- without tenderness or masses Rectovaginal: not indicated and hemorrhoids: external non-thrombosed Lymphatic Exam: Non-palpable nodes in neck, clavicular, axillary, or inguinal regions Skin: no rash or abnormalities Extremities: no redness or tenderness in the calves or thighs, no edema, negative Homan's  Neurologic: grossly normal Psychiatric: Alert and oriented, appropriate affect.   Wet Prep:not applicable Urinalysis:not applicable UPT: Negative   Assessment:    Routine GYN Exam  Metrorrhagia Uterine Fibroids   Plan:    mammogram pap smear TSH,CBC, Vitamin D 25-H, pelvic ultrasound-pending RTO for u/s follow-up   Honour Schwieger,ELMIRAPA-C

## 2011-08-31 LAB — VITAMIN D 25 HYDROXY (VIT D DEFICIENCY, FRACTURES): Vit D, 25-Hydroxy: 21 ng/mL — ABNORMAL LOW (ref 30–89)

## 2011-08-31 NOTE — Progress Notes (Signed)
Quick Note:  Initiate vitamin D protocol. Thanks, EP ______ 

## 2011-09-01 ENCOUNTER — Telehealth: Payer: Self-pay

## 2011-09-01 LAB — PAP IG W/ RFLX HPV ASCU

## 2011-09-01 MED ORDER — VITAMIN D (ERGOCALCIFEROL) 1.25 MG (50000 UNIT) PO CAPS: 50000.0000 [IU] | ORAL_CAPSULE | ORAL | Status: DC

## 2011-09-01 NOTE — Telephone Encounter (Signed)
TC TO PT REGARDING VIT D LEVEL, INFORMED PT THAT VIT D LEVEL IS LOW AND I NEED TO CALL IN RX FOR VIT D AND AFTER 12 WEEKS COME BACK FOR RECHECK.the patient  VOICED UNDERSTANDING.  CALL IN RX TO PT PHARM.

## 2011-09-01 NOTE — Telephone Encounter (Signed)
Message copied by Winfred Leeds on Wed Sep 01, 2011  1:19 PM ------      Message from: Henreitta Leber      Created: Tue Aug 31, 2011  2:31 PM       Initiate vitamin D protocol.  Thanks,  EP

## 2011-09-14 ENCOUNTER — Encounter: Payer: 59 | Admitting: Obstetrics and Gynecology

## 2011-09-14 ENCOUNTER — Ambulatory Visit: Payer: 59

## 2011-09-14 ENCOUNTER — Encounter: Payer: Self-pay | Admitting: Family

## 2011-09-14 ENCOUNTER — Ambulatory Visit (INDEPENDENT_AMBULATORY_CARE_PROVIDER_SITE_OTHER): Payer: 59 | Admitting: Family

## 2011-09-14 VITALS — BP 138/86 | HR 96 | Temp 98.5°F | Resp 16 | Ht 62.0 in | Wt 211.0 lb

## 2011-09-14 DIAGNOSIS — L03019 Cellulitis of unspecified finger: Secondary | ICD-10-CM

## 2011-09-14 DIAGNOSIS — L304 Erythema intertrigo: Secondary | ICD-10-CM | POA: Insufficient documentation

## 2011-09-14 DIAGNOSIS — I10 Essential (primary) hypertension: Secondary | ICD-10-CM

## 2011-09-14 DIAGNOSIS — E785 Hyperlipidemia, unspecified: Secondary | ICD-10-CM

## 2011-09-14 DIAGNOSIS — R7309 Other abnormal glucose: Secondary | ICD-10-CM

## 2011-09-14 DIAGNOSIS — R21 Rash and other nonspecific skin eruption: Secondary | ICD-10-CM

## 2011-09-14 DIAGNOSIS — L03011 Cellulitis of right finger: Secondary | ICD-10-CM | POA: Insufficient documentation

## 2011-09-14 DIAGNOSIS — E119 Type 2 diabetes mellitus without complications: Secondary | ICD-10-CM

## 2011-09-14 DIAGNOSIS — L538 Other specified erythematous conditions: Secondary | ICD-10-CM

## 2011-09-14 LAB — HEPATIC FUNCTION PANEL
ALT: 12 U/L (ref 0–35)
AST: 14 U/L (ref 0–37)
Albumin: 4.2 g/dL (ref 3.5–5.2)
Alkaline Phosphatase: 58 U/L (ref 39–117)
Bilirubin, Direct: 0.1 mg/dL (ref 0.0–0.3)
Indirect Bilirubin: 0.2 mg/dL (ref 0.0–0.9)
Total Bilirubin: 0.3 mg/dL (ref 0.3–1.2)
Total Protein: 7.8 g/dL (ref 6.0–8.3)

## 2011-09-14 LAB — BASIC METABOLIC PANEL
BUN: 15 mg/dL (ref 6–23)
CO2: 29 mEq/L (ref 19–32)
Calcium: 9.6 mg/dL (ref 8.4–10.5)
Chloride: 99 mEq/L (ref 96–112)
Creat: 0.82 mg/dL (ref 0.50–1.10)
Glucose, Bld: 117 mg/dL — ABNORMAL HIGH (ref 70–99)
Potassium: 3.1 mEq/L — ABNORMAL LOW (ref 3.5–5.3)
Sodium: 139 mEq/L (ref 135–145)

## 2011-09-14 LAB — HEMOGLOBIN A1C
Hgb A1c MFr Bld: 6.9 % — ABNORMAL HIGH (ref <5.7)
Mean Plasma Glucose: 151 mg/dL — ABNORMAL HIGH (ref <117)

## 2011-09-14 MED ORDER — FLUCONAZOLE 150 MG PO TABS: ORAL_TABLET | ORAL | Status: DC

## 2011-09-14 MED ORDER — NYSTATIN 100000 UNIT/GM EX POWD: CUTANEOUS | Status: DC

## 2011-09-14 MED ORDER — CEPHALEXIN 500 MG PO CAPS: 500.0000 mg | ORAL_CAPSULE | Freq: Three times a day (TID) | ORAL | Status: AC

## 2011-09-14 NOTE — Progress Notes (Signed)
  Subjective:    Patient ID: Susan Whitaker, female    DOB: 04-16-1971, 41 y.o.   MRN: [id number]  HPI Ms.  Teodoro is a 41 yr old female who presents today with complaint of pain and swelling of the skin surrounding the right thumb nail.  Symptoms started on Sunday.  Area is sore.  Went to [facility] and was told to go to the doctor.  No drainage.  Rash- She also is requesting a refill on the nystatin powder to treat the rash in her axilla.     Review of Systems See HPI     Objective:   Physical Exam  Constitutional: She appears well-developed and well-nourished. No distress.  Skin:       Right thumb, lateral nailbed with slight swelling/erythema, small amount of visible pus noted beneath skin without drainage.    Hyperpigmented rash bilateral axilla L>R  Psychiatric: She has a normal mood and affect. Her speech is normal and behavior is normal. Judgment and thought content normal. Cognition and memory are normal.          Assessment & Plan:

## 2011-09-14 NOTE — Patient Instructions (Signed)
Call if increased swelling, pain, redness, or if fever of the right thumb.  Follow up in office if not completely resolved in 1 week.   Paronychia Paronychia is an inflammatory reaction involving the folds of the skin surrounding the fingernail. This is commonly caused by an infection in the skin around a nail. The most common cause of paronychia is frequent wetting of the hands (as seen with bartenders, food servers, nurses or others who wet their hands). This makes the skin around the fingernail susceptible to infection by bacteria (germs) or fungus. Other predisposing factors are:  Aggressive manicuring.   Nail biting.   Thumb sucking.  The most common cause is a staphylococcal (a type of germ) infection, or a fungal (Candida) infection. When caused by a germ, it usually comes on suddenly with redness, swelling, pus and is often painful. It may get under the nail and form an abscess (collection of pus), or form an abscess around the nail. If the nail itself is infected with a fungus, the treatment is usually prolonged and may require oral medicine for up to one year. Your caregiver will determine the length of time treatment is required. The paronychia caused by bacteria (germs) may largely be avoided by not pulling on hangnails or picking at cuticles. When the infection occurs at the tips of the finger it is called felon. When the cause of paronychia is from the herpes simplex virus (HSV) it is called herpetic whitlow. TREATMENT  When an abscess is present treatment is often incision and drainage. This means that the abscess must be cut open so the pus can get out. When this is done, the following home care instructions should be followed. HOME CARE INSTRUCTIONS   It is important to keep the affected fingers very dry. Rubber or plastic gloves over cotton gloves should be used whenever the hand must be placed in water.   Keep wound clean, dry and dressed as suggested by your caregiver between  warm soaks or warm compresses.   Soak in warm water for fifteen to twenty minutes three to four times per day for bacterial infections. Fungal infections are very difficult to treat, so often require treatment for long periods of time.   For bacterial (germ) infections take antibiotics (medicine which kill germs) as directed and finish the prescription, even if the problem appears to be solved before the medicine is gone.   Only take over-the-counter or prescription medicines for pain, discomfort, or fever as directed by your caregiver.  SEEK IMMEDIATE MEDICAL CARE IF:  You have redness, swelling, or increasing pain in the wound.   You notice pus coming from the wound.   You have a fever.   You notice a bad smell coming from the wound or dressing.  Document Released: 10/27/2000 Document Revised: 04/22/2011 Document Reviewed: 06/28/2008 [facility] Patient Information 2012 [place], [place].

## 2011-09-14 NOTE — Assessment & Plan Note (Signed)
Obtain A1C and BMET.

## 2011-09-14 NOTE — Assessment & Plan Note (Signed)
BP Readings from Last 3 Encounters:  09/14/11 138/86  08/30/11 140/82  05/28/11 128/82   BP control is reasonable. Check BMET.  Recommended that pt follow back up in 3 months.

## 2011-09-14 NOTE — Assessment & Plan Note (Signed)
Will rx with keflex, and warm soaks QID to promote drainage. Pt instructed to call if increased pain/swelling, or if not completely resolved in 1 week.

## 2011-09-14 NOTE — Assessment & Plan Note (Signed)
Deteriorated. Will rx with nystatin powder and diflucan.

## 2011-09-15 ENCOUNTER — Telehealth: Payer: Self-pay | Admitting: Family

## 2011-09-15 DIAGNOSIS — E876 Hypokalemia: Secondary | ICD-10-CM

## 2011-09-15 MED ORDER — POTASSIUM CHLORIDE ER 10 MEQ PO TBCR: 10.0000 meq | EXTENDED_RELEASE_TABLET | Freq: Two times a day (BID) | ORAL | Status: DC

## 2011-09-15 NOTE — Telephone Encounter (Signed)
Pls call pt and let her know diabetes is at goal.   Potassium low.  I would like her to start potassium supplement once daily and repeat bmet in 1 week. (hypokalemia).

## 2011-09-15 NOTE — Telephone Encounter (Signed)
Notified pt. Future lab order placed for 09/22/11 and copy forwarded to the lab and pt as a reminder.

## 2011-09-17 ENCOUNTER — Telehealth: Payer: Self-pay | Admitting: Obstetrics and Gynecology

## 2011-09-17 NOTE — Telephone Encounter (Signed)
Routed to triage 

## 2011-09-22 NOTE — Telephone Encounter (Signed)
PT CALLED D/T RESCHEDULING OF APPT, BH CALLED PT, MADE AWARE, AND WILL CALL PT BACK

## 2011-09-23 ENCOUNTER — Encounter: Payer: 59 | Admitting: Obstetrics and Gynecology

## 2011-09-30 ENCOUNTER — Ambulatory Visit (INDEPENDENT_AMBULATORY_CARE_PROVIDER_SITE_OTHER): Payer: 59 | Admitting: Obstetrics and Gynecology

## 2011-09-30 ENCOUNTER — Ambulatory Visit (INDEPENDENT_AMBULATORY_CARE_PROVIDER_SITE_OTHER): Payer: 59

## 2011-09-30 ENCOUNTER — Encounter: Payer: Self-pay | Admitting: Obstetrics and Gynecology

## 2011-09-30 ENCOUNTER — Other Ambulatory Visit: Payer: Self-pay | Admitting: Obstetrics and Gynecology

## 2011-09-30 VITALS — BP 130/80 | HR 82 | Wt 210.0 lb

## 2011-09-30 DIAGNOSIS — D219 Benign neoplasm of connective and other soft tissue, unspecified: Secondary | ICD-10-CM

## 2011-09-30 DIAGNOSIS — D259 Leiomyoma of uterus, unspecified: Secondary | ICD-10-CM

## 2011-09-30 DIAGNOSIS — N946 Dysmenorrhea, unspecified: Secondary | ICD-10-CM

## 2011-09-30 DIAGNOSIS — N920 Excessive and frequent menstruation with regular cycle: Secondary | ICD-10-CM

## 2011-09-30 MED ORDER — NAPROXEN SODIUM ER 750 MG PO TB24: 750.0000 mg | ORAL_TABLET | Freq: Every day | ORAL | Status: DC

## 2011-09-30 MED ORDER — HYDROCODONE-ACETAMINOPHEN 5-500 MG PO TABS: 1.0000 | ORAL_TABLET | Freq: Four times a day (QID) | ORAL | Status: DC | PRN

## 2011-09-30 NOTE — Progress Notes (Signed)
41 YO presents for ultrasound follow up due to increase menstrual flow and history of fibroids.  Patient underwent successful uterine artery embolization years ago but in recent time her periods have become heavy.  She changes her heavy flow pad 4 times daily x 5 days and has severe cramps that no longer respond to OTC analgesia..  Per patient, her pain is more troubling than her heavy bleeding.   O: Ultrsound: uterus 10.8 x 7.33 x 6.72 cm with 3 sub-serosal fibroids all < or = 3.6 cm; ovaries appeared normal  A: Menorrhagia     Dysmenorrhea     Uterine Fibroids     S/P Fibroid Embolization  P: Reviewed management options for symptoms to include: hormonal contraception, herbal/supplemental,  Mirena IUD, Lysteda, fibroid embolization, myomectomy, endometrial ablation and hysterectomy   Vicodin #30 1-2 po q 6 hours prn  (5/500) no refills  Naprelan ER 750mg  #30 1 po pc daily prn 1 refill  Patient given written outline of options, she will consider and let the office know her plan.  Reviewed endometrial biopsy indication and procedure as it may be needed depending on her choice for management.

## 2011-11-05 ENCOUNTER — Ambulatory Visit (INDEPENDENT_AMBULATORY_CARE_PROVIDER_SITE_OTHER): Payer: 59 | Admitting: Family

## 2011-11-05 ENCOUNTER — Telehealth: Payer: Self-pay | Admitting: Family

## 2011-11-05 ENCOUNTER — Encounter: Payer: Self-pay | Admitting: Family

## 2011-11-05 VITALS — BP 154/98 | HR 97 | Resp 16

## 2011-11-05 DIAGNOSIS — I10 Essential (primary) hypertension: Secondary | ICD-10-CM

## 2011-11-05 DIAGNOSIS — R079 Chest pain, unspecified: Secondary | ICD-10-CM

## 2011-11-05 DIAGNOSIS — L304 Erythema intertrigo: Secondary | ICD-10-CM

## 2011-11-05 DIAGNOSIS — L538 Other specified erythematous conditions: Secondary | ICD-10-CM

## 2011-11-05 DIAGNOSIS — R002 Palpitations: Secondary | ICD-10-CM

## 2011-11-05 LAB — CBC WITH DIFFERENTIAL/PLATELET
Basophils Absolute: 0 10*3/uL (ref 0.0–0.1)
Basophils Relative: 0 % (ref 0–1)
Eosinophils Absolute: 0.1 10*3/uL (ref 0.0–0.7)
Eosinophils Relative: 2 % (ref 0–5)
HCT: 39 % (ref 36.0–46.0)
Hemoglobin: 13 g/dL (ref 12.0–15.0)
Lymphocytes Relative: 42 % (ref 12–46)
Lymphs Abs: 3.2 10*3/uL (ref 0.7–4.0)
MCH: 27.8 pg (ref 26.0–34.0)
MCHC: 33.3 g/dL (ref 30.0–36.0)
MCV: 83.5 fL (ref 78.0–100.0)
Monocytes Absolute: 0.5 10*3/uL (ref 0.1–1.0)
Monocytes Relative: 7 % (ref 3–12)
Neutro Abs: 3.8 10*3/uL (ref 1.7–7.7)
Neutrophils Relative %: 49 % (ref 43–77)
Platelets: 533 10*3/uL — ABNORMAL HIGH (ref 150–400)
RBC: 4.67 MIL/uL (ref 3.87–5.11)
RDW: 14 % (ref 11.5–15.5)
WBC: 7.6 10*3/uL (ref 4.0–10.5)

## 2011-11-05 LAB — BASIC METABOLIC PANEL
BUN: 15 mg/dL (ref 6–23)
CO2: 31 mEq/L (ref 19–32)
Calcium: 9.4 mg/dL (ref 8.4–10.5)
Chloride: 100 mEq/L (ref 96–112)
Creat: 0.84 mg/dL (ref 0.50–1.10)
Glucose, Bld: 112 mg/dL — ABNORMAL HIGH (ref 70–99)
Potassium: 3.2 mEq/L — ABNORMAL LOW (ref 3.5–5.3)
Sodium: 140 mEq/L (ref 135–145)

## 2011-11-05 LAB — TSH: TSH: 2.227 u[IU]/mL (ref 0.350–4.500)

## 2011-11-05 MED ORDER — FLUCONAZOLE 100 MG PO TABS: 100.0000 mg | ORAL_TABLET | Freq: Every day | ORAL | Status: AC

## 2011-11-05 MED ORDER — METOPROLOL SUCCINATE ER 50 MG PO TB24: 50.0000 mg | ORAL_TABLET | Freq: Every day | ORAL | Status: DC

## 2011-11-05 MED ORDER — POTASSIUM CHLORIDE CRYS ER 20 MEQ PO TBCR: 20.0000 meq | EXTENDED_RELEASE_TABLET | Freq: Every day | ORAL | Status: DC

## 2011-11-05 NOTE — Assessment & Plan Note (Signed)
EKG reviewed- shows NSR.

## 2011-11-05 NOTE — Telephone Encounter (Addendum)
Reviewed potassium- low 3.1.  Left detailed message on cell re: to take Kdur today, then once daily (called in to [facility]).   Trish, pls follow up with pt on Monday to make sure she got message and also instruct her that I would like her to start toprol xl 50 mg once daily (sent to [facility]) to help with BP.  Follow up in office in about 10 days for visit- we will repeat her blood pressure and lab work that day.

## 2011-11-05 NOTE — Assessment & Plan Note (Addendum)
Deteriorated.  Add Toprol- see phone note.

## 2011-11-05 NOTE — Progress Notes (Signed)
Subjective:    Patient ID: Susan Whitaker, female    DOB: 1971-03-29, 41 y.o.   MRN: [id number]  HPI  [name] is a 41 yr old female who presents today with chief complaint of palpitations.  Initially, started 1 week ago on Friday. Palpitations are intermittent and last 3-5 minutes. Yesterday she reports that she had palpitations x 12.  She reports that she has had some associated dizziness.  No current symptom.  She reports that she still has dark rash under her arms.  Used weekly diflucan and nystatin powder without significant improvement.  BP is also up.    Review of Systems    see HPI  Past Medical History  Diagnosis Date  . GERD (gastroesophageal reflux disease)   . Hypertension   . Gastric ulcer   . Diabetes mellitus     type II  . Urinary incontinence   . Thrombocytopenia   . Hyperlipidemia   . Allergy     allergic rhinitis  . History of chicken pox   . Hypokalemia   . H/O hemorrhoids   . PRB (rectal bleeding)   . H/O constipation   . FHx: hypertension   . H/O varicella   . Preterm labor   . Yeast infection   . Bacterial infection   . H/O dysmenorrhea 10/2004  . Vaginal odor 06/29/05  . Thyroid fullness 08/2005  . Hx: UTI (urinary tract infection)   . BV (bacterial vaginosis) 2007  . Menometrorrhagia 06/2006  . Female infertility 2008  . Oligomenorrhea 2010  . Urge incontinence 02/2010  . Increased BMI 06/2010    History   Social History  . Marital Status: Single    Spouse Name: N/A    Number of Children: 2  . Years of Education: N/A   Occupational History  . 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 Smoking status: Never Smoker   . Smokeless tobacco: Never Used  . Alcohol Use: No  . Drug Use: No  . Sexually Active: Yes -- Female partner(s)    Birth Control/ Protection: None   Other Topics Concern  . Not on file   Social History Narrative  . No narrative on file    Past Surgical History  Procedure Date  . Abdominal surgery     2  months of age-- unsure of type of surgery  . Hemorrhoid surgery 2005/2006  . Uterine fibroid embolization     Family History  Problem Relation Age of Onset  . Cancer Other     breast, lung  . Coronary artery disease Other   . Hypertension Other   . Stroke Other   . Cancer Mother     breast  . Stroke Mother   . Cancer Maternal Grandmother     breast  . Hypertension Maternal Grandmother   . Vision loss Father     Allergies  Allergen Reactions  . Lisinopril     REACTION: ACE cough    Current Outpatient Prescriptions on File Prior to Visit  Medication Sig Dispense Refill  . BORIC ACID EX Apply 600 mg topically as needed. Pt using pill form in vagina. [facility] [address]       . cyclobenzaprine (FLEXERIL) 5 MG tablet Take 1 tablet by mouth Three times a day.      Marland Kitchen glucose blood (FREESTYLE LITE) test strip Use as instructed  100 each  2  . HYDROcodone-acetaminophen (VICODIN) 5-500 MG  per tablet Take 1-2 tablets by mouth every 6 (six) hours as needed for pain.  30 tablet  0  . Lancets (FREESTYLE) lancets Use to check blood sugar once a day as instructed       . losartan (COZAAR) 50 MG tablet Take 1 tablet (50 mg total) by mouth daily.  30 tablet  5  . lovastatin (MEVACOR) 20 MG tablet Take 20 mg by mouth every evening.        . metFORMIN (GLUCOPHAGE) 500 MG tablet Take 500 mg by mouth 2 (two) times daily with a meal.        . Naproxen Sodium (NAPRELAN) 750 MG TB24 Take 1 tablet (750 mg total) by mouth daily. with food  30 each  1  . nystatin (MYCOSTATIN) powder Apply twice daily under armpits until rash resolved.  60 g  2  . spironolactone (ALDACTONE) 25 MG tablet Take 25 mg by mouth daily.        . Vitamin D, Ergocalciferol, (DRISDOL) 50000 UNITS CAPS Take 1 capsule (50,000 Units total) by mouth once a week. For 12 weeks  20 capsule  0  . potassium chloride SA (K-DUR,KLOR-CON) 20 MEQ tablet Take 1 tablet (20 mEq total) by mouth daily.  30 tablet  0     BP 154/98  Pulse 97  Resp 16  SpO2 98%    Objective:   Physical Exam  Constitutional: She appears well-developed and well-nourished. No distress.  Cardiovascular: Normal rate and regular rhythm.   No murmur heard. Pulmonary/Chest: Effort normal and breath sounds normal. No respiratory distress. She has no wheezes. She has no rales. She exhibits no tenderness.  Psychiatric: Her behavior is normal. Judgment and thought content normal.       Pt was tearful when she entered exam room, but quickly calmed down.           Assessment & Plan:

## 2011-11-05 NOTE — Assessment & Plan Note (Signed)
Will try diflucan once daily x 1 week for rash bilateral axilla.  If no improvement, plan referral to derm.

## 2011-11-05 NOTE — Patient Instructions (Addendum)
You will be contacted about your Holter monitor and your stress test. Please complete your lab work prior to leaving.  Follow up in 1 month. Go to ER if you develop worsening palpitations or if you develop chest pain.  Hold mevacor while you are taking fluconazole.

## 2011-11-08 NOTE — Telephone Encounter (Signed)
Notified pt and scheduled f/u for 11/22/11 at 2:15pm.

## 2011-11-22 ENCOUNTER — Telehealth: Payer: Self-pay | Admitting: Family

## 2011-11-22 ENCOUNTER — Ambulatory Visit: Payer: 59 | Admitting: Family

## 2011-11-22 DIAGNOSIS — R002 Palpitations: Secondary | ICD-10-CM

## 2011-11-22 DIAGNOSIS — R079 Chest pain, unspecified: Secondary | ICD-10-CM

## 2011-11-22 NOTE — Telephone Encounter (Signed)
Order has been placed for holter and exercise stress test. My apologies for the delay. Thank you.

## 2011-11-22 NOTE — Telephone Encounter (Signed)
Patient states that Melissa told her that we would order a 48 hour heart monitor for patient to wear. She says that no one has called her on this. Also, patient rescheduled appointment that was for this afternoon and is coming in on 11/26/11

## 2011-11-22 NOTE — Telephone Encounter (Signed)
11/05/11 office note states pt would be contacted about holter monitor but I do not see an order in EP[employer]. Has this been placed?

## 2011-11-23 NOTE — Telephone Encounter (Signed)
Pt has been notified.

## 2011-11-26 ENCOUNTER — Encounter: Payer: Self-pay | Admitting: Family

## 2011-11-26 ENCOUNTER — Ambulatory Visit (INDEPENDENT_AMBULATORY_CARE_PROVIDER_SITE_OTHER): Payer: 59 | Admitting: Family

## 2011-11-26 ENCOUNTER — Other Ambulatory Visit: Payer: Self-pay | Admitting: Family

## 2011-11-26 VITALS — BP 140/80 | HR 73 | Temp 98.0°F | Resp 16 | Wt 212.0 lb

## 2011-11-26 DIAGNOSIS — I10 Essential (primary) hypertension: Secondary | ICD-10-CM

## 2011-11-26 DIAGNOSIS — R002 Palpitations: Secondary | ICD-10-CM

## 2011-11-26 DIAGNOSIS — E876 Hypokalemia: Secondary | ICD-10-CM

## 2011-11-26 LAB — BASIC METABOLIC PANEL
BUN: 14 mg/dL (ref 6–23)
CO2: 29 mEq/L (ref 19–32)
Calcium: 9.1 mg/dL (ref 8.4–10.5)
Chloride: 99 mEq/L (ref 96–112)
Creat: 0.91 mg/dL (ref 0.50–1.10)
Glucose, Bld: 108 mg/dL — ABNORMAL HIGH (ref 70–99)
Potassium: 3.4 mEq/L — ABNORMAL LOW (ref 3.5–5.3)
Sodium: 139 mEq/L (ref 135–145)

## 2011-11-26 NOTE — Patient Instructions (Addendum)
Go to [facility] [address] [name]. on Tuesday July 16th at 3:30 PM [address]. Follow up in 1 month- sooner if problems/concerns.

## 2011-11-26 NOTE — Progress Notes (Signed)
Subjective:    Patient ID: Susan Whitaker, female    DOB: 11-17-1970, 41 y.o.   MRN: [id number]  HPI  [name] is a 41 yr old female who presents today for follow up of her palpitations.  She reports that she continues to have intermittent palpitations.   Palpitations last 1-3 minutes and are associated with dizziness.  Can occur at rest.  Last episode was about 3 hours ago. Holter monitor was scheduled for this past Tuesday, but the patient tells me that she missed the appointment because she was not notified about it. She is upset about this. The apt has been rescheduled for 7/19, but she is uncomfortable waiting that long.  She wishes to complete it sooner.     HTN-  Last visit Toprol was added to her regimen for uncontrolled BP and palpitations.  She is tolerating this medication without difficulty. BP Readings from Last 3 Encounters:  11/26/11 140/80  11/05/11 154/98  09/30/11 130/80   Hypokalemia- last visit Kdur once daily was added to her regimen. She reports + compliance.   Review of Systems  See HPI  Past Medical History  Diagnosis Date  . GERD (gastroesophageal reflux disease)   . Hypertension   . Gastric ulcer   . Diabetes mellitus     type II  . Urinary incontinence   . Thrombocytopenia   . Hyperlipidemia   . Allergy     allergic rhinitis  . History of chicken pox   . Hypokalemia   . H/O hemorrhoids   . PRB (rectal bleeding)   . H/O constipation   . FHx: hypertension   . H/O varicella   . Preterm labor   . Yeast infection   . Bacterial infection   . H/O dysmenorrhea 10/2004  . Vaginal odor 06/29/05  . Thyroid fullness 08/2005  . Hx: UTI (urinary tract infection)   . BV (bacterial vaginosis) 2007  . Menometrorrhagia 06/2006  . Female infertility 2008  . Oligomenorrhea 2010  . Urge incontinence 02/2010  . Increased BMI 06/2010    History   Social History  . Marital Status: Single    Spouse Name: N/A    Number of Children: 2  . Years of Education:  N/A   Occupational History  . 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 Smoking status: Never Smoker   . Smokeless tobacco: Never Used  . Alcohol Use: No  . Drug Use: No  . Sexually Active: Yes -- Female partner(s)    Birth Control/ Protection: None   Other Topics Concern  . Not on file   Social History Narrative  . No narrative on file    Past Surgical History  Procedure Date  . Abdominal surgery     71 months of age-- unsure of type of surgery  . Hemorrhoid surgery 2005/2006  . Uterine fibroid embolization     Family History  Problem Relation Age of Onset  . Cancer Other     breast, lung  . Coronary artery disease Other   . Hypertension Other   . Stroke Other   . Cancer Mother     breast  . Stroke Mother   . Cancer Maternal Grandmother     breast  . Hypertension Maternal Grandmother   . Vision loss Father     Allergies  Allergen Reactions  . Lisinopril     REACTION: ACE cough    Current Outpatient Prescriptions on File Prior to Visit  Medication Sig Dispense Refill  . BORIC ACID EX Apply 600 mg topically as needed. Pt using pill form in vagina. [facility] [address]       . cyclobenzaprine (FLEXERIL) 5 MG tablet Take 1 tablet by mouth Three times a day.      Marland Kitchen glucose blood (FREESTYLE LITE) test strip Use as instructed  100 each  2  . HYDROcodone-acetaminophen (VICODIN) 5-500 MG per tablet Take 1-2 tablets by mouth every 6 (six) hours as needed for pain.  30 tablet  0  . Lancets (FREESTYLE) lancets Use to check blood sugar once a day as instructed       . losartan (COZAAR) 50 MG tablet Take 1 tablet (50 mg total) by mouth daily.  30 tablet  5  . lovastatin (MEVACOR) 20 MG tablet Take 20 mg by mouth every evening.        . metFORMIN (GLUCOPHAGE) 500 MG tablet Take 500 mg by mouth 2 (two) times daily with a meal.        . metoprolol succinate (TOPROL-XL) 50 MG 24 hr tablet Take 1 tablet (50 mg total) by mouth daily. Take with or  immediately following a meal.  30 tablet  0  . Naproxen Sodium (NAPRELAN) 750 MG TB24 Take 1 tablet (750 mg total) by mouth daily. with food  30 each  1  . nystatin (MYCOSTATIN) powder Apply twice daily under armpits until rash resolved.  60 g  2  . spironolactone (ALDACTONE) 25 MG tablet Take 25 mg by mouth daily.        . Vitamin D, Ergocalciferol, (DRISDOL) 50000 UNITS CAPS Take 1 capsule (50,000 Units total) by mouth once a week. For 12 weeks  20 capsule  0  . potassium chloride SA (K-DUR,KLOR-CON) 20 MEQ tablet Take 40 mEq by mouth daily.        BP 140/80  Pulse 73  Temp 98 F (36.7 C) (Oral)  Resp 16  Wt 212 lb (96.163 kg)  SpO2 99%        Objective:   Physical Exam  Constitutional: She appears well-developed and well-nourished. No distress.  Cardiovascular: Normal rate and regular rhythm.   No murmur heard. Pulmonary/Chest: Breath sounds normal. No respiratory distress. She has no wheezes. She has no rales. She exhibits no tenderness.  Psychiatric: She has a normal mood and affect. Her behavior is normal. Judgment and thought content normal.          Assessment & Plan:

## 2011-11-27 ENCOUNTER — Telehealth: Payer: Self-pay | Admitting: Family

## 2011-11-27 DIAGNOSIS — E876 Hypokalemia: Secondary | ICD-10-CM

## 2011-11-27 NOTE — Telephone Encounter (Signed)
Spoke to pt re hypokalemia. Instructed her to increase potassium to 2 tabs daily. Instructed her to go to lab end of next week for follow up potassium level pls. Pt verb understanding.

## 2011-11-28 DIAGNOSIS — E876 Hypokalemia: Secondary | ICD-10-CM | POA: Insufficient documentation

## 2011-11-28 NOTE — Assessment & Plan Note (Addendum)
Unchanged.  [facility] and was able to move up holter monitor to 7/16. Stress test is scheduled for 8/7.  Pt aware and more comfortable with this date. Continue beta blocker.

## 2011-11-28 NOTE — Assessment & Plan Note (Signed)
Improved, continue cozaar, toprol, aldactone.

## 2011-11-28 NOTE — Assessment & Plan Note (Addendum)
Potassium remains low despite repletion with kdur (was 3.2 now 3.4).  Will increase Kdur to and plan to repeat in 1 week. Hypokalemia could be playing a role in the pt's complaint of palpitations- therefore important to replete. Consider Hyperaldo workup next visit with 8AM PAC/PRA ratio- however, use of cozaar and aldactone, may interfere with these results.

## 2011-11-29 LAB — MAGNESIUM: Magnesium: 2.2 mg/dL (ref 1.5–2.5)

## 2011-11-29 NOTE — Telephone Encounter (Signed)
Future lab order placed and given to the lab.

## 2011-11-30 ENCOUNTER — Encounter (INDEPENDENT_AMBULATORY_CARE_PROVIDER_SITE_OTHER): Payer: 59

## 2011-11-30 DIAGNOSIS — R002 Palpitations: Secondary | ICD-10-CM

## 2011-12-06 ENCOUNTER — Ambulatory Visit (INDEPENDENT_AMBULATORY_CARE_PROVIDER_SITE_OTHER): Payer: 59 | Admitting: Family

## 2011-12-06 ENCOUNTER — Encounter: Payer: Self-pay | Admitting: Family

## 2011-12-06 VITALS — BP 140/90 | HR 88 | Temp 98.5°F | Resp 16 | Ht 62.0 in | Wt 211.1 lb

## 2011-12-06 DIAGNOSIS — E876 Hypokalemia: Secondary | ICD-10-CM

## 2011-12-06 DIAGNOSIS — R002 Palpitations: Secondary | ICD-10-CM

## 2011-12-06 MED ORDER — POTASSIUM CHLORIDE CRYS ER 20 MEQ PO TBCR: 40.0000 meq | EXTENDED_RELEASE_TABLET | Freq: Two times a day (BID) | ORAL | Status: DC

## 2011-12-06 NOTE — Progress Notes (Signed)
Subjective:    Patient ID: Susan Whitaker, female    DOB: Sep 22, 1970, 41 y.o.   MRN: [id number]  HPI  Ms.  Miltenberger is a 41 yr old female who presents today for follow up.  Palpitations- holter was completed on 7/18.  Stress test is sheduled for first week in August.  She reports that the first day of her holter she did not experience any palpitations, but she did experience palpitations on the second day.    Hypokalemia- she is now taking Kdur once daily.  Review of Systems See HPI  Past Medical History  Diagnosis Date  . GERD (gastroesophageal reflux disease)   . Hypertension   . Gastric ulcer   . Diabetes mellitus     type II  . Urinary incontinence   . Thrombocytopenia   . Hyperlipidemia   . Allergy     allergic rhinitis  . History of chicken pox   . Hypokalemia   . H/O hemorrhoids   . PRB (rectal bleeding)   . H/O constipation   . FHx: hypertension   . H/O varicella   . Preterm labor   . Yeast infection   . Bacterial infection   . H/O dysmenorrhea 10/2004  . Vaginal odor 06/29/05  . Thyroid fullness 08/2005  . Hx: UTI (urinary tract infection)   . BV (bacterial vaginosis) 2007  . Menometrorrhagia 06/2006  . Female infertility 2008  . Oligomenorrhea 2010  . Urge incontinence 02/2010  . Increased BMI 06/2010    History   Social History  . Marital Status: Single    Spouse Name: N/A    Number of Children: 2  . Years of Education: N/A   Occupational History  . 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 Smoking status: Never Smoker   . Smokeless tobacco: Never Used  . Alcohol Use: No  . Drug Use: No  . Sexually Active: Yes -- Female partner(s)    Birth Control/ Protection: None   Other Topics Concern  . Not on file   Social History Narrative  . No narrative on file    Past Surgical History  Procedure Date  . Abdominal surgery     62 months of age-- unsure of type of surgery  . Hemorrhoid surgery 2005/2006  . Uterine fibroid embolization      Family History  Problem Relation Age of Onset  . Cancer Other     breast, lung  . Coronary artery disease Other   . Hypertension Other   . Stroke Other   . Cancer Mother     breast  . Stroke Mother   . Cancer Maternal Grandmother     breast  . Hypertension Maternal Grandmother   . Vision loss Father     Allergies  Allergen Reactions  . Lisinopril     REACTION: ACE cough    Current Outpatient Prescriptions on File Prior to Visit  Medication Sig Dispense Refill  . BORIC ACID EX Apply 600 mg topically as needed. Pt using pill form in vagina. [facility] [address]       . cyclobenzaprine (FLEXERIL) 5 MG tablet Take 1 tablet by mouth Three times a day.      Marland Kitchen glucose blood (FREESTYLE LITE) test strip Use as instructed  100 each  2  . HYDROcodone-acetaminophen (VICODIN) 5-500 MG per tablet Take 1-2 tablets by mouth every 6 (six) hours as needed for pain.  30 tablet  0  . Lancets (FREESTYLE) lancets Use to check blood sugar once a day as instructed       . losartan (COZAAR) 50 MG tablet Take 1 tablet (50 mg total) by mouth daily.  30 tablet  5  . lovastatin (MEVACOR) 20 MG tablet Take 20 mg by mouth every evening.        . metFORMIN (GLUCOPHAGE) 500 MG tablet Take 500 mg by mouth 2 (two) times daily with a meal.        . metoprolol succinate (TOPROL-XL) 50 MG 24 hr tablet Take 1 tablet (50 mg total) by mouth daily. Take with or immediately following a meal.  30 tablet  0  . Naproxen Sodium (NAPRELAN) 750 MG TB24 Take 1 tablet (750 mg total) by mouth daily. with food  30 each  1  . spironolactone (ALDACTONE) 25 MG tablet Take 25 mg by mouth daily.        . Vitamin D, Ergocalciferol, (DRISDOL) 50000 UNITS CAPS Take 1 capsule (50,000 Units total) by mouth once a week. For 12 weeks  20 capsule  0  . nystatin (MYCOSTATIN) powder Apply twice daily under armpits until rash resolved.  60 g  2    BP 140/90  Pulse 88  Temp 98.5 F (36.9 C) (Oral)  Resp 16  Ht  5\' 2"  (1.575 m)  Wt 211 lb 1.9 oz (95.763 kg)  BMI 38.61 kg/m2  SpO2 99%       Objective:   Physical Exam  Constitutional: She appears well-developed and well-nourished. No distress.  HENT:  Head: Normocephalic and atraumatic.  Cardiovascular: Normal rate and regular rhythm.   Pulmonary/Chest: Effort normal and breath sounds normal. No respiratory distress. She has no wheezes. She has no rales. She exhibits no tenderness.  Psychiatric: She has a normal mood and affect. Her behavior is normal. Judgment and thought content normal.          Assessment & Plan:

## 2011-12-06 NOTE — Assessment & Plan Note (Addendum)
Unchanged, await holter results.  Pt will complete Stress test on 8/7.

## 2011-12-06 NOTE — Assessment & Plan Note (Signed)
Obtain potassium level.   Continue Kdur.

## 2011-12-06 NOTE — Patient Instructions (Addendum)
Please complete your blood work prior to leaving.  Follow up in 2 months. 

## 2011-12-07 ENCOUNTER — Telehealth: Payer: Self-pay | Admitting: Family

## 2011-12-07 DIAGNOSIS — E876 Hypokalemia: Secondary | ICD-10-CM

## 2011-12-07 LAB — POTASSIUM: Potassium: 3.3 mEq/L — ABNORMAL LOW (ref 3.5–5.3)

## 2011-12-07 NOTE — Telephone Encounter (Signed)
Pls call pt and let her know that potassium is still low.  I would like her to increase potassium to (2 tabs) twice daily.  Repeat bmet and magnesium in 1 week- (hypokalemia).

## 2011-12-07 NOTE — Telephone Encounter (Signed)
Attempted to reach pt and left detailed message on cell# re: instructions below and to call if any questions. Future lab order placed for 12/14/11 and given to the lab.

## 2011-12-08 ENCOUNTER — Telehealth: Payer: Self-pay | Admitting: Family

## 2011-12-08 DIAGNOSIS — R002 Palpitations: Secondary | ICD-10-CM

## 2011-12-08 NOTE — Telephone Encounter (Signed)
Patient returned phone call. °

## 2011-12-08 NOTE — Telephone Encounter (Signed)
Reviewed holter Monitor results.  No abnormal rythems, some brief episodes of increased heart rate.  At this point, I would like to have her continue the potassium and I will place an order for her to be scheduled to meet with Dr. Jens Som- cardiology (pended below).  He may want to adjust her medication.  Left message for pt to return call.

## 2011-12-08 NOTE — Telephone Encounter (Signed)
Attempted to reach pt and received voicemail. Left detailed message re: instructions below. Pt called back and is agreeable to proceed with referral. Referral has been signed.

## 2011-12-14 ENCOUNTER — Encounter (HOSPITAL_BASED_OUTPATIENT_CLINIC_OR_DEPARTMENT_OTHER): Payer: Self-pay | Admitting: Emergency Medicine

## 2011-12-14 ENCOUNTER — Emergency Department (HOSPITAL_BASED_OUTPATIENT_CLINIC_OR_DEPARTMENT_OTHER): Payer: 59

## 2011-12-14 ENCOUNTER — Telehealth: Payer: Self-pay | Admitting: *Deleted

## 2011-12-14 ENCOUNTER — Inpatient Hospital Stay (HOSPITAL_BASED_OUTPATIENT_CLINIC_OR_DEPARTMENT_OTHER)
Admission: EM | Admit: 2011-12-14 | Discharge: 2011-12-15 | DRG: 310 | Disposition: A | Discharge status: Home / Self Care | Payer: 59 | Attending: Cardiology | Admitting: Cardiology

## 2011-12-14 DIAGNOSIS — E119 Type 2 diabetes mellitus without complications: Secondary | ICD-10-CM | POA: Diagnosis present

## 2011-12-14 DIAGNOSIS — I10 Essential (primary) hypertension: Secondary | ICD-10-CM | POA: Diagnosis present

## 2011-12-14 DIAGNOSIS — E669 Obesity, unspecified: Secondary | ICD-10-CM | POA: Diagnosis present

## 2011-12-14 DIAGNOSIS — K219 Gastro-esophageal reflux disease without esophagitis: Secondary | ICD-10-CM | POA: Diagnosis present

## 2011-12-14 DIAGNOSIS — Z6838 Body mass index (BMI) 38.0-38.9, adult: Secondary | ICD-10-CM

## 2011-12-14 DIAGNOSIS — E876 Hypokalemia: Secondary | ICD-10-CM | POA: Diagnosis present

## 2011-12-14 DIAGNOSIS — I471 Supraventricular tachycardia: Secondary | ICD-10-CM

## 2011-12-14 DIAGNOSIS — E785 Hyperlipidemia, unspecified: Secondary | ICD-10-CM | POA: Diagnosis present

## 2011-12-14 DIAGNOSIS — I498 Other specified cardiac arrhythmias: Principal | ICD-10-CM

## 2011-12-14 HISTORY — DX: Supraventricular tachycardia, unspecified: I47.10

## 2011-12-14 HISTORY — DX: Supraventricular tachycardia: I47.1

## 2011-12-14 LAB — CBC WITH DIFFERENTIAL/PLATELET
Basophils Absolute: 0.1 10*3/uL (ref 0.0–0.1)
Basophils Relative: 1 % (ref 0–1)
Eosinophils Absolute: 0.2 10*3/uL (ref 0.0–0.7)
Eosinophils Relative: 2 % (ref 0–5)
HCT: 39.8 % (ref 36.0–46.0)
Hemoglobin: 14 g/dL (ref 12.0–15.0)
Lymphocytes Relative: 41 % (ref 12–46)
Lymphs Abs: 3.1 10*3/uL (ref 0.7–4.0)
MCH: 28.4 pg (ref 26.0–34.0)
MCHC: 35.2 g/dL (ref 30.0–36.0)
MCV: 80.7 fL (ref 78.0–100.0)
Monocytes Absolute: 0.6 10*3/uL (ref 0.1–1.0)
Monocytes Relative: 8 % (ref 3–12)
Neutro Abs: 3.5 10*3/uL (ref 1.7–7.7)
Neutrophils Relative %: 47 % (ref 43–77)
Platelets: 503 10*3/uL — ABNORMAL HIGH (ref 150–400)
RBC: 4.93 MIL/uL (ref 3.87–5.11)
RDW: 14.2 % (ref 11.5–15.5)
WBC: 7.4 10*3/uL (ref 4.0–10.5)

## 2011-12-14 LAB — TROPONIN I: Troponin I: <0.3 ng/mL (ref <0.30)

## 2011-12-14 LAB — COMPREHENSIVE METABOLIC PANEL
ALT: 7 U/L (ref 0–35)
AST: 15 U/L (ref 0–37)
Albumin: 4 g/dL (ref 3.5–5.2)
Alkaline Phosphatase: 64 U/L (ref 39–117)
BUN: 19 mg/dL (ref 6–23)
CO2: 30 mEq/L (ref 19–32)
Calcium: 9.7 mg/dL (ref 8.4–10.5)
Chloride: 98 mEq/L (ref 96–112)
Creatinine, Ser: 0.9 mg/dL (ref 0.50–1.10)
GFR calc Af Amer: >90 mL/min (ref >90)
GFR calc non Af Amer: 78 mL/min — ABNORMAL LOW (ref >90)
Glucose, Bld: 144 mg/dL — ABNORMAL HIGH (ref 70–99)
Potassium: 3.2 mEq/L — ABNORMAL LOW (ref 3.5–5.1)
Sodium: 140 mEq/L (ref 135–145)
Total Bilirubin: 0.3 mg/dL (ref 0.3–1.2)
Total Protein: 8.8 g/dL — ABNORMAL HIGH (ref 6.0–8.3)

## 2011-12-14 LAB — CBC
HCT: 39 % (ref 36.0–46.0)
Hemoglobin: 13.4 g/dL (ref 12.0–15.0)
MCH: 28.5 pg (ref 26.0–34.0)
MCHC: 34.4 g/dL (ref 30.0–36.0)
MCV: 83 fL (ref 78.0–100.0)
Platelets: 496 10*3/uL — ABNORMAL HIGH (ref 150–400)
RBC: 4.7 MIL/uL (ref 3.87–5.11)
RDW: 14.2 % (ref 11.5–15.5)
WBC: 8.6 10*3/uL (ref 4.0–10.5)

## 2011-12-14 LAB — CREATININE, SERUM
Creatinine, Ser: 0.82 mg/dL (ref 0.50–1.10)
GFR calc Af Amer: >90 mL/min (ref >90)
GFR calc non Af Amer: 87 mL/min — ABNORMAL LOW (ref >90)

## 2011-12-14 LAB — D-DIMER, QUANTITATIVE (NOT AT ARMC): D-Dimer, Quant: 0.44 ug/mL-FEU (ref 0.00–0.48)

## 2011-12-14 LAB — GLUCOSE, CAPILLARY: Glucose-Capillary: 149 mg/dL — ABNORMAL HIGH (ref 70–99)

## 2011-12-14 LAB — MRSA PCR SCREENING: MRSA by PCR: NEGATIVE

## 2011-12-14 MED ORDER — SIMVASTATIN 10 MG PO TABS
10.0000 mg | ORAL_TABLET | Freq: Every day | ORAL | Status: DC
  Administered 2011-12-15: 10 mg via ORAL
  Filled 2011-12-14: qty 1

## 2011-12-14 MED ORDER — LOSARTAN POTASSIUM 50 MG PO TABS
50.0000 mg | ORAL_TABLET | Freq: Every day | ORAL | Status: DC
  Administered 2011-12-15: 50 mg via ORAL
  Filled 2011-12-14: qty 1

## 2011-12-14 MED ORDER — SODIUM CHLORIDE 0.9 % IV SOLN: 250.0000 mL | INTRAVENOUS | Status: DC | PRN

## 2011-12-14 MED ORDER — LORAZEPAM 2 MG/ML IJ SOLN
1.0000 mg | Freq: Once | INTRAMUSCULAR | Status: AC
  Administered 2011-12-14: 1 mg via INTRAVENOUS
  Filled 2011-12-14: qty 1

## 2011-12-14 MED ORDER — NAPROXEN 375 MG PO TABS
375.0000 mg | ORAL_TABLET | Freq: Two times a day (BID) | ORAL | Status: DC
  Administered 2011-12-15 (×2): 375 mg via ORAL
  Filled 2011-12-14 (×3): qty 1

## 2011-12-14 MED ORDER — METOPROLOL TARTRATE 1 MG/ML IV SOLN
5.0000 mg | Freq: Once | INTRAVENOUS | Status: AC
  Administered 2011-12-14: 5 mg via INTRAVENOUS
  Filled 2011-12-14: qty 5

## 2011-12-14 MED ORDER — NITROGLYCERIN 0.4 MG SL SUBL: 0.4000 mg | SUBLINGUAL_TABLET | SUBLINGUAL | Status: DC | PRN

## 2011-12-14 MED ORDER — SPIRONOLACTONE 25 MG PO TABS
25.0000 mg | ORAL_TABLET | Freq: Every day | ORAL | Status: DC
  Administered 2011-12-15: 25 mg via ORAL
  Filled 2011-12-14: qty 1

## 2011-12-14 MED ORDER — ONDANSETRON HCL 4 MG/2ML IJ SOLN: 4.0000 mg | Freq: Four times a day (QID) | INTRAMUSCULAR | Status: DC | PRN

## 2011-12-14 MED ORDER — HEPARIN SODIUM (PORCINE) 5000 UNIT/ML IJ SOLN
5000.0000 [IU] | Freq: Three times a day (TID) | INTRAMUSCULAR | Status: DC
  Administered 2011-12-14 – 2011-12-15 (×3): 5000 [IU] via SUBCUTANEOUS
  Filled 2011-12-14 (×5): qty 1

## 2011-12-14 MED ORDER — ADENOSINE 6 MG/2ML IV SOLN
INTRAVENOUS | Status: AC
  Filled 2011-12-14: qty 2

## 2011-12-14 MED ORDER — INSULIN ASPART 100 UNIT/ML ~~LOC~~ SOLN
0.0000 [IU] | Freq: Three times a day (TID) | SUBCUTANEOUS | Status: DC
Start: 2011-12-15 — End: 2011-12-15
  Administered 2011-12-15: 3 [IU] via SUBCUTANEOUS

## 2011-12-14 MED ORDER — ACETAMINOPHEN 325 MG PO TABS: 650.0000 mg | ORAL_TABLET | ORAL | Status: DC | PRN

## 2011-12-14 MED ORDER — SODIUM CHLORIDE 0.9 % IJ SOLN
3.0000 mL | Freq: Two times a day (BID) | INTRAMUSCULAR | Status: DC
  Administered 2011-12-14: 3 mL via INTRAVENOUS

## 2011-12-14 MED ORDER — METOPROLOL TARTRATE 50 MG PO TABS
25.0000 mg | ORAL_TABLET | Freq: Once | ORAL | Status: AC
  Administered 2011-12-14: 25 mg via ORAL
  Filled 2011-12-14: qty 1

## 2011-12-14 MED ORDER — NAPROXEN SODIUM ER 750 MG PO TB24
750.0000 mg | ORAL_TABLET | Freq: Every day | ORAL | Status: DC
  Filled 2011-12-14: qty 1

## 2011-12-14 MED ORDER — SODIUM CHLORIDE 0.9 % IJ SOLN: 3.0000 mL | INTRAMUSCULAR | Status: DC | PRN

## 2011-12-14 MED ORDER — ZOLPIDEM TARTRATE 5 MG PO TABS: 5.0000 mg | ORAL_TABLET | Freq: Every evening | ORAL | Status: DC | PRN

## 2011-12-14 MED ORDER — AMIODARONE IV BOLUS ONLY 150 MG/100ML
150.0000 mg | Freq: Once | INTRAVENOUS | Status: AC
  Administered 2011-12-14: 150 mg via INTRAVENOUS
  Filled 2011-12-14: qty 100

## 2011-12-14 MED ORDER — ASPIRIN EC 81 MG PO TBEC
81.0000 mg | DELAYED_RELEASE_TABLET | Freq: Every day | ORAL | Status: DC
  Administered 2011-12-15: 81 mg via ORAL
  Filled 2011-12-14: qty 1

## 2011-12-14 MED ORDER — METOPROLOL TARTRATE 25 MG PO TABS
25.0000 mg | ORAL_TABLET | Freq: Two times a day (BID) | ORAL | Status: DC
  Administered 2011-12-14 – 2011-12-15 (×2): 25 mg via ORAL
  Filled 2011-12-14 (×3): qty 1

## 2011-12-14 MED ORDER — ALPRAZOLAM 0.25 MG PO TABS: 0.2500 mg | ORAL_TABLET | Freq: Two times a day (BID) | ORAL | Status: DC | PRN

## 2011-12-14 MED ORDER — SODIUM CHLORIDE 0.9 % IV SOLN
Freq: Once | INTRAVENOUS | Status: AC
  Administered 2011-12-14: 12:00:00 via INTRAVENOUS

## 2011-12-14 MED ORDER — POTASSIUM CHLORIDE CRYS ER 20 MEQ PO TBCR
40.0000 meq | EXTENDED_RELEASE_TABLET | Freq: Two times a day (BID) | ORAL | Status: DC
  Administered 2011-12-14 – 2011-12-15 (×2): 40 meq via ORAL
  Filled 2011-12-14 (×3): qty 2

## 2011-12-14 NOTE — ED Notes (Signed)
Attempted to call report to 2600 at [facility]. Eber Jones, RN is busy at this time and will return my call.

## 2011-12-14 NOTE — ED Notes (Signed)
Pt having intermittent tachycardia between 160-190's. Pt HR 100 and regular at this time on monitor. Pt brother at bedside. NAD, will continue to monitor.

## 2011-12-14 NOTE — ED Notes (Signed)
Pt presents to ED stating that " my heart feels like it's racing and I'm dizzy". Pt denies N/V/D and no fever. Pt reports that she has worn a halter monitor 2 weeks ago. Pt also states that she has hx of HTN, DM.

## 2011-12-14 NOTE — H&P (Signed)
HPI: 41 year old female with no prior cardiac history admitted with supraventricular tachycardia. The patient states that over the past several months she has had intermittent episodes of palpitations. They are sudden in onset and described as her heart racing. It typically lasts approximately 1 minute. There is associated dizziness, dyspnea, chest pain. They resolve spontaneously. Her episodes have become more frequent and last evening she had 6 episodes. She did wear a Holter monitor that showed sinus rhythm with PACs, short runs of PAT versus brief atrial fibrillation. She went to [facility] emergency room today and was found to have an episode while there recorded on electrocardiogram. This shows probable AVNRT. She otherwise has some dyspnea on exertion but no orthopnea, PND, pedal edema, syncope or exertional chest pain.  Medications Prior to Admission  Medication Sig Dispense Refill  . losartan (COZAAR) 50 MG tablet Take 1 tablet (50 mg total) by mouth daily.  30 tablet  5  . lovastatin (MEVACOR) 20 MG tablet Take 20 mg by mouth every evening.       . metFORMIN (GLUCOPHAGE) 500 MG tablet Take 500 mg by mouth 2 (two) times daily with a meal.        . Naproxen Sodium (NAPRELAN) 750 MG TB24 Take 1 tablet (750 mg total) by mouth daily. with food  30 each  1  . spironolactone (ALDACTONE) 25 MG tablet Take 25 mg by mouth daily.        Marland Kitchen glucose blood (FREESTYLE LITE) test strip Use as instructed  100 each  2  . Lancets (FREESTYLE) lancets Use to check blood sugar once a day as instructed       . potassium chloride SA (K-DUR,KLOR-CON) 20 MEQ tablet Take 2 tablets (40 mEq total) by mouth 2 (two) times daily.  60 tablet  2  . Vitamin D, Ergocalciferol, (DRISDOL) 50000 UNITS CAPS Take 1 capsule (50,000 Units total) by mouth once a week. For 12 weeks  20 capsule  0    Allergies  Allergen Reactions  . Lisinopril     REACTION: ACE cough    Past Medical History  Diagnosis Date  . GERD  (gastroesophageal reflux disease)   . Hypertension   . Gastric ulcer   . Diabetes mellitus     type II  . Urinary incontinence   . Thrombocytopenia   . Hyperlipidemia   . Allergy     allergic rhinitis  . Hypokalemia   . H/O hemorrhoids   . H/O constipation   . FHx: hypertension   . H/O varicella   . Thyroid fullness 08/2005  . Increased BMI 06/2010    Past Surgical History  Procedure Date  . Abdominal surgery     46 months of age-- unsure of type of surgery  . Hemorrhoid surgery 2005/2006  . Uterine fibroid embolization     History   Social History  . Marital Status: Single    Spouse Name: N/A    Number of Children: 2  . Years of Education: N/A   Occupational History  . 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 Smoking status: Never Smoker   . Smokeless tobacco: Never Used  . Alcohol Use: No  . Drug Use: No  . Sexually Active: Yes -- Female partner(s)    Birth Control/ Protection: None   Other Topics Concern  . Not on file   Social History Narrative  . No narrative on file    Family History  Problem Relation Age of  Onset  . Cancer Other     breast, lung  . Hypertension Other   . Stroke Other   . Cancer Mother     breast  . Stroke Mother   . Cancer Maternal Grandmother     breast  . Hypertension Maternal Grandmother   . Vision loss Father   . Heart disease Father     Rhythm disturbance    ROS:  no fevers or chills, productive cough, hemoptysis, dysphasia, odynophagia, melena, hematochezia, dysuria, hematuria, rash, seizure activity, orthopnea, PND, pedal edema, claudication. Remaining systems are negative.  Physical Exam:   Blood pressure 162/89, pulse 80, temperature 98.2 F (36.8 C), temperature source Oral, resp. rate 18, height 5\' 2"  (1.575 m), weight 95.255 kg (210 lb), last menstrual period 12/12/2011, SpO2 100.00%.  General:  Well developed/well nourished in NAD Skin warm/dry Patient not depressed No peripheral  clubbing Back-normal HEENT-normal/normal eyelids Neck supple/normal carotid upstroke bilaterally; no bruits; no JVD; possible thyromegaly chest - CTA/ normal expansion CV - RRR/normal S1 and S2; no murmurs, rubs or gallops;  PMI nondisplaced Abdomen -NT/ND, no HSM, no mass, + bowel sounds, no bruit 2+ femoral pulses, no bruits Ext-no edema, chords, 2+ DP Neuro-grossly nonfocal  ECG sinus tachycardia at a rate of 105. Left axis deviation. Nonspecific ST changes. A second electrocardiogram reveals probable AVNRT terminating into sinus.  Results for orders placed during the hospital encounter of 12/14/11 (from the past 48 hour(s))  CBC WITH DIFFERENTIAL     Status: Abnormal   Collection Time   12/14/11 12:10 PM      Component Value Range Comment   WBC 7.4  4.0 - 10.5 K/uL    RBC 4.93  3.87 - 5.11 MIL/uL    Hemoglobin 14.0  12.0 - 15.0 g/dL    HCT 57.8  46.9 - 62.9 %    MCV 80.7  78.0 - 100.0 fL    MCH 28.4  26.0 - 34.0 pg    MCHC 35.2  30.0 - 36.0 g/dL    RDW 52.8  41.3 - 24.4 %    Platelets 503 (*) 150 - 400 K/uL    Neutrophils Relative 47  43 - 77 %    Neutro Abs 3.5  1.7 - 7.7 K/uL    Lymphocytes Relative 41  12 - 46 %    Lymphs Abs 3.1  0.7 - 4.0 K/uL    Monocytes Relative 8  3 - 12 %    Monocytes Absolute 0.6  0.1 - 1.0 K/uL    Eosinophils Relative 2  0 - 5 %    Eosinophils Absolute 0.2  0.0 - 0.7 K/uL    Basophils Relative 1  0 - 1 %    Basophils Absolute 0.1  0.0 - 0.1 K/uL   COMPREHENSIVE METABOLIC PANEL     Status: Abnormal   Collection Time   12/14/11 12:10 PM      Component Value Range Comment   Sodium 140  135 - 145 mEq/L    Potassium 3.2 (*) 3.5 - 5.1 mEq/L    Chloride 98  96 - 112 mEq/L    CO2 30  19 - 32 mEq/L    Glucose, Bld 144 (*) 70 - 99 mg/dL    BUN 19  6 - 23 mg/dL    Creatinine, Ser 0.10  0.50 - 1.10 mg/dL    Calcium 9.7  8.4 - 27.2 mg/dL    Total Protein 8.8 (*) 6.0 - 8.3 g/dL    Albumin  4.0  3.5 - 5.2 g/dL    AST 15  0 - 37 U/L    ALT 7  0 - 35  U/L    Alkaline Phosphatase 64  39 - 117 U/L    Total Bilirubin 0.3  0.3 - 1.2 mg/dL    GFR calc non Af Amer 78 (*) >90 mL/min    GFR calc Af Amer >90  >90 mL/min   D-DIMER, QUANTITATIVE     Status: Normal   Collection Time   12/14/11 12:10 PM      Component Value Range Comment   D-Dimer, Quant 0.44  0.00 - 0.48 ug/mL-FEU   TROPONIN I     Status: Normal   Collection Time   12/14/11 12:10 PM      Component Value Range Comment   Troponin I <0.30  <0.30 ng/mL     Dg Chest 2 View  12/14/2011  *RADIOLOGY REPORT*  Clinical Data: Tachycardia, chest pain and dizziness.  CHEST - 2 VIEW  Comparison: None.  Findings: Trachea is midline.  Heart size normal.  Lungs are clear. No pleural fluid.  IMPRESSION: No acute findings.  Original Report Authenticated By: Reyes Ivan, M.D.    Assessment/Plan #1-supraventricular tachycardia-the patient presents with recurrent and increasing frequency of palpitations. Previous Holter monitor showed sinus rhythm with PACs and brief PAT versus atrial fibrillation. We will have those results placed on the chart for review. Her electrocardiogram with her episode today shows probable AVNRT. Plan to check echocardiogram and TSH. Add metoprolol 25 mg by mouth twice a day.  I will ask electrophysiology to review for consideration of ablation. #2-diabetes mellitus-continue present medications and follow CBG. #3-hypertension-continue present medications. #4-hypokalemia-continue supplement and follow. #5-question thyroid fullness-followup primary care following discharge. Olga Millers MD 12/14/2011, 7:38 PM

## 2011-12-14 NOTE — ED Provider Notes (Signed)
History     CSN: [id number]  Arrival date & time 12/14/11  1141   First MD Initiated Contact with Patient 12/14/11 1222      Chief Complaint  Patient presents with  . Palpitations  . Dizziness    (Consider location/radiation/quality/duration/timing/severity/associated sxs/prior treatment) Patient is a 41 y.o. female presenting with palpitations. The history is provided by the patient. No language interpreter was used.  Palpitations  This is a recurrent problem. Episode onset: 3 weeks. The problem occurs constantly. The problem has been gradually worsening. Associated symptoms include nausea, dizziness and shortness of breath.    Past Medical History  Diagnosis Date  . GERD (gastroesophageal reflux disease)   . Hypertension   . Gastric ulcer   . Diabetes mellitus     type II  . Urinary incontinence   . Thrombocytopenia   . Hyperlipidemia   . Allergy     allergic rhinitis  . History of chicken pox   . Hypokalemia   . H/O hemorrhoids   . PRB (rectal bleeding)   . H/O constipation   . FHx: hypertension   . H/O varicella   . Preterm labor   . Yeast infection   . Bacterial infection   . H/O dysmenorrhea 10/2004  . Vaginal odor 06/29/05  . Thyroid fullness 08/2005  . Hx: UTI (urinary tract infection)   . BV (bacterial vaginosis) 2007  . Menometrorrhagia 06/2006  . Female infertility 2008  . Oligomenorrhea 2010  . Urge incontinence 02/2010  . Increased BMI 06/2010    Past Surgical History  Procedure Date  . Abdominal surgery     75 months of age-- unsure of type of surgery  . Hemorrhoid surgery 2005/2006  . Uterine fibroid embolization     Family History  Problem Relation Age of Onset  . Cancer Other     breast, lung  . Coronary artery disease Other   . Hypertension Other   . Stroke Other   . Cancer Mother     breast  . Stroke Mother   . Cancer Maternal Grandmother     breast  . Hypertension Maternal Grandmother   . Vision loss Father     History    Substance Use Topics  . Smoking status: Never Smoker   . Smokeless tobacco: Never Used  . Alcohol Use: No    OB History    Grav Para Term Preterm Abortions TAB SAB Ect Mult Living   4 2 2  1  1   2       Review of Systems  Respiratory: Positive for shortness of breath.   Cardiovascular: Positive for palpitations.  Gastrointestinal: Positive for nausea.  Neurological: Positive for dizziness.  All other systems reviewed and are negative.    Allergies  Lisinopril  Home Medications   Current Outpatient Rx  Name Route Sig Dispense Refill  . BORIC ACID EX Apply externally Apply 600 mg topically as needed. Pt using pill form in vagina. [facility] rx [facility] on [address]     . CYCLOBENZAPRINE HCL 5 MG PO TABS Oral Take 1 tablet by mouth Three times a day.    Marland Kitchen GLUCOSE BLOOD VI STRP  Use as instructed 100 each 2  . HYDROCODONE-ACETAMINOPHEN 5-500 MG PO TABS Oral Take 1-2 tablets by mouth every 6 (six) hours as needed for pain. 30 tablet 0  . FREESTYLE LANCETS MISC  Use to check blood sugar once a day as instructed     . LOSARTAN  POTASSIUM 50 MG PO TABS Oral Take 1 tablet (50 mg total) by mouth daily. 30 tablet 5  . LOVASTATIN 20 MG PO TABS Oral Take 20 mg by mouth every evening.      Marland Kitchen METFORMIN HCL 500 MG PO TABS Oral Take 500 mg by mouth 2 (two) times daily with a meal.      . METOPROLOL SUCCINATE ER 50 MG PO TB24 Oral Take 1 tablet (50 mg total) by mouth daily. Take with or immediately following a meal. 30 tablet 0  . NAPROXEN SODIUM ER 750 MG PO TB24 Oral Take 1 tablet (750 mg total) by mouth daily. with food 30 each 1  . POTASSIUM CHLORIDE CRYS ER 20 MEQ PO TBCR Oral Take 2 tablets (40 mEq total) by mouth 2 (two) times daily. 60 tablet 2  . SPIRONOLACTONE 25 MG PO TABS Oral Take 25 mg by mouth daily.      Marland Kitchen VITAMIN D (ERGOCALCIFEROL) 50000 UNITS PO CAPS Oral Take 1 capsule (50,000 Units total) by mouth once a week. For 12 weeks 20 capsule 0    BP 152/112  Pulse 92   Temp 98 F (36.7 C) (Oral)  Ht 5\' 2"  (1.575 m)  Wt 210 lb (95.255 kg)  BMI 38.41 kg/m2  SpO2 100%  LMP 12/12/2011  Physical Exam  Nursing note and vitals reviewed. Constitutional: She is oriented to person, place, and time. She appears well-developed and well-nourished.  HENT:  Head: Normocephalic and atraumatic.  Right Ear: External ear normal.  Left Ear: External ear normal.  Nose: Nose normal.  Eyes: Conjunctivae and EOM are normal. Pupils are equal, round, and reactive to light.  Neck: Normal range of motion. Neck supple.  Cardiovascular: Normal rate, regular rhythm and normal heart sounds.   Pulmonary/Chest: Effort normal and breath sounds normal.  Abdominal: Soft.  Musculoskeletal: Normal range of motion.  Neurological: She is alert and oriented to person, place, and time. She has normal reflexes.  Skin: Skin is warm.  Psychiatric: She has a normal mood and affect.    ED Course  Procedures (including critical care time)  Labs Reviewed  CBC WITH DIFFERENTIAL - Abnormal; Notable for the following:    Platelets 503 (*)     All other components within normal limits  COMPREHENSIVE METABOLIC PANEL - Abnormal; Notable for the following:    Potassium 3.2 (*)     Glucose, Bld 144 (*)     Total Protein 8.8 (*)     GFR calc non Af Amer 78 (*)     All other components within normal limits  D-DIMER, QUANTITATIVE  TROPONIN I   Dg Chest 2 View  12/14/2011  *RADIOLOGY REPORT*  Clinical Data: Tachycardia, chest pain and dizziness.  CHEST - 2 VIEW  Comparison: None.  Findings: Trachea is midline.  Heart size normal.  Lungs are clear. No pleural fluid.  IMPRESSION: No acute findings.  Original Report Authenticated By: Reyes Ivan, M.D.    Date: 12/14/2011  Rate: 198  Rhythm: supraventricular tachycardia (SVT)  QRS Axis: normal  Intervals: normal  ST/T Wave abnormalities: nonspecific ST/T changes  Conduction Disutrbances:none  Narrative Interpretation:   Old EKG  Reviewed: none available  1. SVT (supraventricular tachycardia)       MDM  Pt has had multiple episodes of svt.  approx 10 over the last 2 hours.  The longest last about 60 seconds.  The shortest about 8 seconds. Pt given lopressor 25 mg po.   Pt very anxious  with episodes.   Pt given ativan 1mg  to help with symptoms.   Dr. Manus Gunning evaluated pt.   I spoke to Dr. Riley Kill with [facility] who will admit pt for evaluation.        Lonia Skinner [place], [place] 12/14/11 1517

## 2011-12-14 NOTE — ED Provider Notes (Signed)
Medical screening examination/treatment/procedure(s) were conducted as a shared visit with non-physician practitioner(s) and myself.  I personally evaluated the patient during the encounter  Recurrent SVT, multiple episodes lasting 10-45 sec.  Resolving spontaneously before intervention.  Given lopressor. D/w cardiology given recurrent frequent episodes. Consider amiodarone load.  CRITICAL CARE Performed by: Glynn Octave   Total critical care time: 30  Critical care time was exclusive of separately billable procedures and treating other patients.  Critical care was necessary to treat or prevent imminent or life-threatening deterioration.  Critical care was time spent personally by me on the following activities: development of treatment plan with patient and/or surrogate as well as nursing, discussions with consultants, evaluation of patient's response to treatment, examination of patient, obtaining history from patient or surrogate, ordering and performing treatments and interventions, ordering and review of laboratory studies, ordering and review of radiographic studies, pulse oximetry and re-evaluation of patient's condition.   Glynn Octave, MD 12/14/11 1540

## 2011-12-14 NOTE — Telephone Encounter (Signed)
Received call from pt stating she has had onset of dizziness since last night and it has been constant through this morning. Pt reports shortness of breath and chest pain this morning as well. Advised pt of need for ER evaluation. Pt voices understanding.

## 2011-12-14 NOTE — ED Notes (Addendum)
Called via [facility] for Cardiology to call Langston Masker

## 2011-12-14 NOTE — ED Notes (Signed)
Pt states that she has been out of her PO Potassium for 2 weeks

## 2011-12-15 ENCOUNTER — Encounter (HOSPITAL_COMMUNITY): Payer: Self-pay | Admitting: Physician Assistant

## 2011-12-15 LAB — BASIC METABOLIC PANEL
BUN: 14 mg/dL (ref 6–23)
CO2: 30 mEq/L (ref 19–32)
Calcium: 8.4 mg/dL (ref 8.4–10.5)
Chloride: 102 mEq/L (ref 96–112)
Creatinine, Ser: 0.82 mg/dL (ref 0.50–1.10)
GFR calc Af Amer: >90 mL/min (ref >90)
GFR calc non Af Amer: 87 mL/min — ABNORMAL LOW (ref >90)
Glucose, Bld: 117 mg/dL — ABNORMAL HIGH (ref 70–99)
Potassium: 2.5 mEq/L — CL (ref 3.5–5.1)
Sodium: 142 mEq/L (ref 135–145)

## 2011-12-15 LAB — T4, FREE: Free T4: 0.99 ng/dL (ref 0.80–1.80)

## 2011-12-15 LAB — GLUCOSE, CAPILLARY
Glucose-Capillary: 154 mg/dL — ABNORMAL HIGH (ref 70–99)
Glucose-Capillary: 110 mg/dL — ABNORMAL HIGH (ref 70–99)
Glucose-Capillary: 116 mg/dL — ABNORMAL HIGH (ref 70–99)

## 2011-12-15 LAB — TSH: TSH: 1.769 u[IU]/mL (ref 0.350–4.500)

## 2011-12-15 MED ORDER — POTASSIUM CHLORIDE 20 MEQ/15ML (10%) PO LIQD
40.0000 meq | Freq: Once | ORAL | Status: AC
  Administered 2011-12-15: 40 meq via ORAL
  Filled 2011-12-15: qty 30

## 2011-12-15 MED ORDER — ASPIRIN 81 MG PO TBEC: 81.0000 mg | DELAYED_RELEASE_TABLET | Freq: Every day | ORAL | Status: AC

## 2011-12-15 MED ORDER — NAPROXEN SODIUM ER 750 MG PO TB24: 750.0000 mg | ORAL_TABLET | Freq: Every day | ORAL | Status: DC

## 2011-12-15 MED ORDER — POTASSIUM CHLORIDE CRYS ER 20 MEQ PO TBCR
40.0000 meq | EXTENDED_RELEASE_TABLET | Freq: Once | ORAL | Status: AC
Start: 2011-12-15 — End: 2011-12-15
  Administered 2011-12-15: 40 meq via ORAL

## 2011-12-15 MED ORDER — METOPROLOL TARTRATE 25 MG PO TABS: 25.0000 mg | ORAL_TABLET | Freq: Two times a day (BID) | ORAL | Status: DC

## 2011-12-15 NOTE — Progress Notes (Signed)
Nutrition Brief Note  Patient identified on the Nutrition Risk Report for Unintentional Wt Loss. Pt states that she is trying to lose weight because her doctor instructed her to. Pt states she has recently lost around 6-8 lb.  Body mass index is 38.31 kg/(m^2). Pt meets criteria for Obese Class II based on current BMI.   Current diet order is Carbohydrate Modified Medium, with adequate intake at this time, per her report. Labs and medications reviewed. Pt states that her appetite was normal PTA and she followed a low sodium diet. Denies any questions/concerns at this time.  No nutrition interventions warranted at this time. If nutrition issues arise, please re-consult RD.   Jarold Motto MS, RD, LDN Pager: [phone] After-hours pager: [phone]

## 2011-12-15 NOTE — Progress Notes (Signed)
Patient ID: Susan Whitaker, female   DOB: 20-Jan-1971, 41 y.o.   MRN: [id number]  K 2.5.  Ordered additional 40 meq KCL for now, then pt takes KCL 40 BID daily standing dose.

## 2011-12-15 NOTE — Progress Notes (Signed)
CRITICAL VALUE ALERT  Critical value received:  Potassium 2.5  Date of notification:  12/15/11  Time of notification:  0440  Critical value read back:yes  Nurse who received alert:  M.Martin, RN  MD notified (1st page):  Dr. Lurline Idol  Time of first page:  [phone]  MD notified (2nd page):  Time of second page:  Responding MD:  Dr. Lurline Idol  Time MD responded:  [phone]

## 2011-12-15 NOTE — Discharge Summary (Signed)
Discharge Summary   Patient ID: Susan Whitaker MRN: [id number], DOB/AGE: [birth year] 41 y.o. Admit date: 12/14/2011 D/C date:     12/15/2011  Primary Cardiologist: Dr. Lewayne Bunting  Primary Discharge Diagnoses:  1. SVT  2. Hypokalemia 3. Possible thyromegaly - will need to f/u PCP 4. Thrombocytosis 5. Obesity - BMI 38.5  Secondary Discharge Diagnoses:  1. GERD 2. HTN 3. H/o gastric ulcer 4. Type II DM 5. Urinary incontinence 6. H/o thrombocytopenia 7. HLD 8. Allergic rhinitis 9. H/o hemorrhoids 10. H/o varicella  Hospital Course: 41 year old female with no prior cardiac history was admitted with supraventricular tachycardia. She described intermittent episodes of palpitations, sudden in onset and described as her heart racing associated with dzziness, dyspnea, chest pain. The episodes usually last approximately 1 minute and resolve spontaneously. Her episodes have become more frequent and last evening she had 6 episodes. She did wear a Holter monitor that showed sinus rhythm with PACs, short runs of PAT versus brief atrial fibrillation. She went to [facility] emergency room yesterday and was found to have an episode while there recorded on EKG showing probable AVNRT. D-dimer was negative. Troponin I negative x 1. She was admitted to the hospital and metoprolol was added. She was also noted to be hypokalemic of 3.2 on admission, down to 2.5 at its lowest this AM. She is being worked up for this as an outpatient - note OV 11/26/11 discussed possible hyperaldosteronism workup; she had a normal Mg that day. She received 4 doses of of potassium this admission (2 doses in addition to normal BID dose). She had no further arrhythmia overnight. Dr. Ladona Ridgel saw the patient today and recommended she continue her BB - he has seen and examined the patient today and feels she is stable for discharge. She will be instructed to follow up with him to discuss catheter ablation. She should follow up with  PCP for continued w/u of hypokalemia with labwork. TSH/Free T4 are pending at this time. Dr. Jens Som did note possible thyromegaly so she was also instructed to discuss this with PCP. Dr. Ladona Ridgel would like to keep ETT appt. She will have an echocardiogram the same day as her OP appt.   Discharge Vitals: Blood pressure 127/71, pulse 70, temperature 97.4 F (36.3 C), temperature source Axillary, resp. rate 18, height 5\' 2"  (1.575 m), weight 209 lb 7 oz (95 kg), last menstrual period 12/12/2011, SpO2 99.00%.  Labs: Lab Results  Component Value Date   WBC 8.6 12/14/2011   HGB 13.4 12/14/2011   HCT 39.0 12/14/2011   MCV 83.0 12/14/2011   PLT 496* 12/14/2011     Lab 12/15/11 0355 12/14/11 1210  NA 142 --  K 2.5* --  CL 102 --  CO2 30 --  BUN 14 --  CREATININE 0.82 --  CALCIUM 8.4 --  PROT -- 8.8*  BILITOT -- 0.3  ALKPHOS -- 64  ALT -- 7  AST -- 15  GLUCOSE 117* --    Basename 12/14/11 1210  CKTOTAL --  CKMB --  TROPONINI <0.30   Lab Results  Component Value Date   CHOL 191 03/05/2010   HDL 40 03/05/2010   LDLCALC 124* 03/05/2010   TRIG 137 03/05/2010   Lab Results  Component Value Date   DDIMER 0.44 12/14/2011    Diagnostic Studies/Procedures   Dg Chest 2 View - 12/14/2011  *RADIOLOGY REPORT*  Clinical Data: Tachycardia, chest pain and dizziness. CHEST - 2 VIEW. Comparison: None.  Findings: Trachea is  midline.  Heart size normal.  Lungs are clear. No pleural fluid. IMPRESSION: No acute findings.  Original Report Authenticated By: Reyes Ivan, M.D.   Discharge Medications   Medication List  As of 12/15/2011  4:33 PM   TAKE these medications         aspirin 81 MG EC tablet   Take 1 tablet (81 mg total) by mouth daily.      freestyle lancets   Use to check blood sugar once a day as instructed      glucose blood test strip   Use as instructed      losartan 50 MG tablet   Commonly known as: COZAAR   Take 1 tablet (50 mg total) by mouth daily.      lovastatin  20 MG tablet   Commonly known as: MEVACOR   Take 20 mg by mouth every evening.      metFORMIN 500 MG tablet   Commonly known as: GLUCOPHAGE   Take 500 mg by mouth 2 (two) times daily with a meal.      metoprolol tartrate 25 MG tablet   Commonly known as: LOPRESSOR   Take 1 tablet (25 mg total) by mouth 2 (two) times daily.      Naproxen Sodium 750 MG Tb24   Take 1 tablet (750 mg total) by mouth daily. with food      potassium chloride SA 20 MEQ tablet   Commonly known as: K-DUR,KLOR-CON   Take 2 tablets (40 mEq total) by mouth 2 (two) times daily.      spironolactone 25 MG tablet   Commonly known as: ALDACTONE   Take 25 mg by mouth daily.      Vitamin D (Ergocalciferol) 50000 UNITS Caps   Commonly known as: DRISDOL   Take 1 capsule (50,000 Units total) by mouth once a week. For 12 weeks          Note that ASA was started this admission in setting of DM, CP. Since she takes Naprosyn, we indicated on DC paperwork that ASA can increase risk of stomach bleeding in pts taking NSAIDS so she is to let physician know if she develops stomach upset, bleeding in stool or black tarry stools.  Disposition   The patient will be discharged in stable condition to home. Discharge Orders    Future Appointments: Provider: Department: Dept Phone: Center:   12/22/2011 10:00 AM Beatrice Lecher, PA [facility] [phone] [facility]     Joint Appt [facility] [place] [phone] [facility]   01/19/2012 9:30 AM [facility]  None   01/19/2012 11:00 AM Marinus Maw, MD [facility] [phone] [facility]     Future Orders Please Complete By Expires   Diet - low sodium heart healthy      Comments:   Diabetic Diet   Increase activity slowly      Comments:   You may return to normal activity, but as tolerated. If you develop chest pain or shortness of breath, call your doctor. If your heart palpitations persist and do not resolve  spontaneously or begin occurring frequently again, call Dr. Lubertha Basque office. Only drive if you are feeling well. If you are driving and start to feel poorly, pull over and request medical attention. You may return to work 12/20/11.   Discharge instructions      Comments:   DO NOT TAKE YOUR METOPROLOL THE NIGHT BEFORE OR DAY OF YOUR STRESS TEST.  Follow-up Information    Follow up with Lewayne Bunting, MD. (Keep appointment for treadmill stress test 12/22/11 at 10am. You will have heart ultrasound and appointment with Dr. Ladona Ridgel on 01/19/12 starting at 9:30am. You do not need to keep appt with Dr. Jens Som since you were seen in the hospital.)    Contact information:   [address] [place] [address] [phone]       Follow up with Lemont Fillers., NP. (Please call upon discharge to discuss further follow up/monitoring of low potassium. Dr. Jens Som noted possible thyroid gland fullness on exam. Please schedule appointment for evaluation of this, as well as mildly elevated platelet count. )    Contact information:   [address] [place] [address] [phone]          The patient would prefer to return to work Monday 12/20/11 which is reasonable given recent frequency of episodes. Work note was given.  Duration of Discharge Encounter: Greater than 30 minutes including physician and PA time.  Signed, Ronie Spies PA-C 12/15/2011, 4:33 PM

## 2011-12-15 NOTE — Progress Notes (Signed)
Assumed pt care at 1530hrs, Assessment agrees with am assessment.  Order for d/c at 1650hrs.  Pt aware.  Called family for ride home.  Will review all d/c instructions and pack all belongings to go with pt.

## 2011-12-15 NOTE — Progress Notes (Signed)
Patient ID: Susan Whitaker, female   DOB: 18-Aug-1970, 41 y.o.   MRN: [id number] Subjective:  No recurrent palpitations.  Objective:  Vital Signs in the last 24 hours: Temp:  [97.4 F (36.3 C)-98.6 F (37 C)] 97.4 F (36.3 C) (07/31 1100) Pulse Rate:  [62-188] 70  (07/31 1100) Resp:  [0-18] 18  (07/31 1100) BP: (105-162)/(36-92) 127/71 mmHg (07/31 1100) SpO2:  [95 %-100 %] 99 % (07/31 0900) Weight:  [209 lb 7 oz (95 kg)-210 lb (95.255 kg)] 209 lb 7 oz (95 kg) (07/31 0600)  Intake/Output from previous day: 07/30 0701 - 07/31 0700 In: 360 [P.O.:360] Out: 200 [Urine:200] Intake/Output from this shift: Total I/O In: 120 [P.O.:120] Out: -   Physical Exam: Well appearing, morbidly obese, NAD HEENT: Unremarkable Neck:  No JVD, no thyromegally Lungs:  Clear with no wheezes HEART:  Regular rate rhythm, no murmurs, no rubs, no clicks Abd:  Flat, positive bowel sounds, no organomegally, no rebound, no guarding Ext:  2 plus pulses, no edema, no cyanosis, no clubbing Skin:  No rashes no nodules Neuro:  CN II through XII intact, motor grossly intact  Lab Results:  [facility] 12/14/11 2123 12/14/11 1210  WBC 8.6 7.4  HGB 13.4 14.0  PLT 496* 503*    Basename 12/15/11 0355 12/14/11 2123 12/14/11 1210  NA 142 -- 140  K 2.5* -- 3.2*  CL 102 -- 98  CO2 30 -- 30  GLUCOSE 117* -- 144*  BUN 14 -- 19  CREATININE 0.82 0.82 --    Basename 12/14/11 1210  TROPONINI <0.30   Hepatic Function Panel  Basename 12/14/11 1210  PROT 8.8*  ALBUMIN 4.0  AST 15  ALT 7  ALKPHOS 64  BILITOT 0.3  BILIDIR --  IBILI --   No results found for this basename: CHOL in the last 72 hours No results found for this basename: PROTIME in the last 72 hours  Imaging: Dg Chest 2 View  12/14/2011  *RADIOLOGY REPORT*  Clinical Data: Tachycardia, chest pain and dizziness.  CHEST - 2 VIEW  Comparison: None.  Findings: Trachea is midline.  Heart size normal.  Lungs are clear. No pleural fluid.  IMPRESSION: No  acute findings.  Original Report Authenticated By: Reyes Ivan, M.D.    Cardiac Studies: Tele - NSR Assessment/Plan:  1. SVT 2. Hypokalemia 3. Obesity, morbid 4. HTN Rec: I have discussed the treatment options. She will remain on her beta blocker. I am not sure how long she has been on this but will allow her to be discharged and I would like to see her back in the office in 3-5 weeks to discuss catheter ablation.   LOS: 1 day    Lewayne Bunting 12/15/2011, 2:16 PM

## 2011-12-17 ENCOUNTER — Telehealth: Payer: Self-pay | Admitting: Family

## 2011-12-17 MED ORDER — FLUCONAZOLE 150 MG PO TABS: 150.0000 mg | ORAL_TABLET | Freq: Once | ORAL | Status: AC

## 2011-12-17 NOTE — Telephone Encounter (Signed)
Attempted to reach pt and left detailed message on cell # and to call if any questions. 

## 2011-12-17 NOTE — Telephone Encounter (Signed)
Patient discharged from hospital Wed, given a antibiotic while in hospital now has vaginal yeast wants meds called in , [facility]

## 2011-12-17 NOTE — Telephone Encounter (Signed)
rx sent to [facility] for diflucan.

## 2011-12-22 ENCOUNTER — Ambulatory Visit (INDEPENDENT_AMBULATORY_CARE_PROVIDER_SITE_OTHER): Payer: 59 | Admitting: Physician Assistant

## 2011-12-22 ENCOUNTER — Ambulatory Visit: Payer: 59 | Admitting: Cardiology

## 2011-12-22 ENCOUNTER — Encounter: Payer: Self-pay | Admitting: Physician Assistant

## 2011-12-22 ENCOUNTER — Telehealth: Payer: Self-pay | Admitting: Family

## 2011-12-22 DIAGNOSIS — R9439 Abnormal result of other cardiovascular function study: Secondary | ICD-10-CM

## 2011-12-22 DIAGNOSIS — R079 Chest pain, unspecified: Secondary | ICD-10-CM

## 2011-12-22 NOTE — Telephone Encounter (Signed)
Pls call pt and arrange a post hospital follow up. 

## 2011-12-22 NOTE — Procedures (Signed)
Exercise Treadmill Test  Pre-Exercise Testing Evaluation Rhythm: normal sinus  Rate: 78   PR:  .16 QRS:  .04  QT:  .39 QTc: .44     Test  Exercise Tolerance Test Ordering MD: Sandford Craze, NP  Interpreting MD: Tereso Newcomer , PA-C  Unique Test No: 1  Treadmill:  1  Indication for ETT: SVT  Contraindication to ETT: No   Stress Modality: exercise - treadmill  Cardiac Imaging Performed: non   Protocol: standard Bruce - maximal  Max BP:  200/113  Max MPHR (bpm):  178 85% MPR (bpm):  151  MPHR obtained (bpm):  141 % MPHR obtained:  80%  Reached 85% MPHR (min:sec):  n/a Total Exercise Time (min-sec):  4:56  Workload in METS:  6.9 Borg Scale: 15  Reason ETT Terminated:  fatigue    ST Segment Analysis At Rest: non-specific ST segment slurring With Exercise: borderline ST changes  Other Information Arrhythmia:  No Angina during ETT:  absent (0) Quality of ETT:  non-diagnostic  ETT Interpretation:  borderline (indeterminate) with non-specific ST changes  Comments: Poor exercise tolerance. No chest pain. Hypertensive BP response to exercise. Borderline ST-T changes.  Cannot rule out ischemia. Target heart rate not achieved.   Recommendations: With risk cardiac risk factors and poor exercise tolerance along with borderline ECG changes, recommended Lexiscan Myoview. Schedule Lexiscan Myoview. Follow up with PCP for management of Hypertension. Tereso Newcomer, PA-C  11:41 AM 12/22/2011

## 2011-12-23 NOTE — Telephone Encounter (Signed)
Patient scheduled hospital follow up for 12/28/11.

## 2011-12-28 ENCOUNTER — Encounter: Payer: Self-pay | Admitting: Family

## 2011-12-28 ENCOUNTER — Ambulatory Visit (INDEPENDENT_AMBULATORY_CARE_PROVIDER_SITE_OTHER): Payer: 59 | Admitting: Family

## 2011-12-28 VITALS — BP 156/86 | HR 73 | Temp 98.1°F | Resp 16 | Wt 211.1 lb

## 2011-12-28 DIAGNOSIS — E049 Nontoxic goiter, unspecified: Secondary | ICD-10-CM

## 2011-12-28 DIAGNOSIS — I498 Other specified cardiac arrhythmias: Secondary | ICD-10-CM

## 2011-12-28 DIAGNOSIS — I471 Supraventricular tachycardia: Secondary | ICD-10-CM

## 2011-12-28 DIAGNOSIS — I10 Essential (primary) hypertension: Secondary | ICD-10-CM

## 2011-12-28 DIAGNOSIS — E876 Hypokalemia: Secondary | ICD-10-CM

## 2011-12-28 DIAGNOSIS — E010 Iodine-deficiency related diffuse (endemic) goiter: Secondary | ICD-10-CM

## 2011-12-28 LAB — BASIC METABOLIC PANEL
BUN: 15 mg/dL (ref 6–23)
CO2: 33 mEq/L — ABNORMAL HIGH (ref 19–32)
Calcium: 9.5 mg/dL (ref 8.4–10.5)
Chloride: 100 mEq/L (ref 96–112)
Creat: 0.82 mg/dL (ref 0.50–1.10)
Glucose, Bld: 92 mg/dL (ref 70–99)
Potassium: 3.7 mEq/L (ref 3.5–5.3)
Sodium: 139 mEq/L (ref 135–145)

## 2011-12-28 MED ORDER — METOPROLOL TARTRATE 50 MG PO TABS: 50.0000 mg | ORAL_TABLET | Freq: Two times a day (BID) | ORAL | Status: DC

## 2011-12-28 NOTE — Progress Notes (Signed)
Subjective:    Patient ID: Susan Whitaker, female    DOB: Jun 21, 1970, 41 y.o.   MRN: [id number]  HPI  [name] is a 41 yr old female who presents today for hospital follow up.  She was admitted to [facility] and diagnosed with SVT.  Hospital records are reviewed.  During this hospitalization she was noted to be hypokalemic and potassium was repleted.  There was question of thyromegally, but TSH was normal.  She was seen by cardiology (EP- Dr. Sharrell Ku) and is being considered for ablation.  She had an exercise tolerance test on 8/7 which showed borderline ST-T changes.  She was referred by cardiology for a lexiscan myoview.    Since returning home, she has had no further episodes.    HTN- She denies current chest pain, shortness of breath or swelling. BP Readings from Last 3 Encounters:  12/28/11 156/86  12/15/11 141/71  12/06/11 140/90      Review of Systems See HPI  Past Medical History  Diagnosis Date  . GERD (gastroesophageal reflux disease)   . Hypertension   . Gastric ulcer   . Diabetes mellitus     type II  . Urinary incontinence   . Thrombocytopenia   . Hyperlipidemia   . Allergy     allergic rhinitis  . Hypokalemia   . H/O hemorrhoids   . H/O constipation   . FHx: hypertension   . H/O varicella   . Thyroid fullness 08/2005  . Increased BMI 06/2010  . SVT (supraventricular tachycardia)     Noted 11/2011 admission    History   Social History  . Marital Status: Single    Spouse Name: N/A    Number of Children: 2  . Years of Education: N/A   Occupational History  . 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 Smoking status: Never Smoker   . Smokeless tobacco: Never Used  . Alcohol Use: No  . Drug Use: No  . Sexually Active: Yes -- Female partner(s)    Birth Control/ Protection: None   Other Topics Concern  . Not on file   Social History Narrative  . No narrative on file    Past Surgical History  Procedure Date  . Abdominal surgery     69  months of age-- unsure of type of surgery  . Hemorrhoid surgery 2005/2006  . Uterine fibroid embolization     Family History  Problem Relation Age of Onset  . Cancer Other     breast, lung  . Hypertension Other   . Stroke Other   . Cancer Mother     breast  . Stroke Mother   . Cancer Maternal Grandmother     breast  . Hypertension Maternal Grandmother   . Vision loss Father   . Heart disease Father     Rhythm disturbance    Allergies  Allergen Reactions  . Lisinopril     REACTION: ACE cough    Current Outpatient Prescriptions on File Prior to Visit  Medication Sig Dispense Refill  . aspirin EC 81 MG EC tablet Take 1 tablet (81 mg total) by mouth daily.      Marland Kitchen glucose blood (FREESTYLE LITE) test strip Use as instructed  100 each  2  . Lancets (FREESTYLE) lancets Use to check blood sugar once a day as instructed       . losartan (COZAAR) 50 MG tablet Take 1 tablet (50 mg total) by mouth daily.  30 tablet  5  . lovastatin (MEVACOR) 20 MG tablet Take 20 mg by mouth every evening.       . metFORMIN (GLUCOPHAGE) 500 MG tablet Take 500 mg by mouth 2 (two) times daily with a meal.        . Naproxen Sodium (NAPRELAN) 750 MG TB24 Take 1 tablet (750 mg total) by mouth daily. with food      . potassium chloride SA (K-DUR,KLOR-CON) 20 MEQ tablet Take 2 tablets (40 mEq total) by mouth 2 (two) times daily.  60 tablet  2  . spironolactone (ALDACTONE) 25 MG tablet Take 25 mg by mouth daily.        . Vitamin D, Ergocalciferol, (DRISDOL) 50000 UNITS CAPS Take 1 capsule (50,000 Units total) by mouth once a week. For 12 weeks  20 capsule  0  . metoprolol (LOPRESSOR) 50 MG tablet Take 1 tablet (50 mg total) by mouth 2 (two) times daily.  60 tablet  0    BP 156/86  Pulse 73  Temp 98.1 F (36.7 C) (Oral)  Resp 16  Wt 211 lb 1.3 oz (95.745 kg)  SpO2 95%  LMP 12/12/2011       Objective:   Physical Exam  Constitutional: She is oriented to person, place, and time. She appears  well-developed and well-nourished. No distress.  HENT:  Head: Normocephalic and atraumatic.  Neck:       ? Slight prominence of thyroid.   Cardiovascular: Normal rate and regular rhythm.   No murmur heard. Pulmonary/Chest: Effort normal and breath sounds normal. No respiratory distress. She has no wheezes. She has no rales. She exhibits no tenderness.  Musculoskeletal: She exhibits no edema.  Neurological: She is alert and oriented to person, place, and time.  Psychiatric: She has a normal mood and affect. Her behavior is normal. Judgment normal.          Assessment & Plan:

## 2011-12-28 NOTE — Patient Instructions (Addendum)
Please complete your blood work prior to leaving.  Follow up in 3 weeks.

## 2011-12-29 DIAGNOSIS — I471 Supraventricular tachycardia, unspecified: Secondary | ICD-10-CM | POA: Insufficient documentation

## 2011-12-29 HISTORY — DX: Supraventricular tachycardia, unspecified: I47.10

## 2011-12-29 NOTE — Assessment & Plan Note (Signed)
Deteriorated.  Increase toprol from 25mg  bid to 50mg  bid.

## 2011-12-29 NOTE — Assessment & Plan Note (Signed)
TSH normal, obtain ultrasound.

## 2011-12-29 NOTE — Assessment & Plan Note (Signed)
Clinically stable. Has stress test scheduled and follow up with Dr. Ladona Ridgel. Continue beta blocker.

## 2011-12-29 NOTE — Assessment & Plan Note (Signed)
Follow up bmet shows normalization of potassium.  Continue current dose of KDur- will refer back to Dr. Kathrene Bongo for evaluation of hypokalemia.

## 2011-12-30 ENCOUNTER — Ambulatory Visit (HOSPITAL_COMMUNITY): Payer: 59 | Attending: Cardiology | Admitting: Radiology

## 2011-12-30 VITALS — BP 157/94 | Ht 62.0 in | Wt 209.0 lb

## 2011-12-30 DIAGNOSIS — R079 Chest pain, unspecified: Secondary | ICD-10-CM | POA: Insufficient documentation

## 2011-12-30 DIAGNOSIS — R42 Dizziness and giddiness: Secondary | ICD-10-CM | POA: Insufficient documentation

## 2011-12-30 DIAGNOSIS — R9431 Abnormal electrocardiogram [ECG] [EKG]: Secondary | ICD-10-CM

## 2011-12-30 DIAGNOSIS — R9439 Abnormal result of other cardiovascular function study: Secondary | ICD-10-CM

## 2011-12-30 DIAGNOSIS — I10 Essential (primary) hypertension: Secondary | ICD-10-CM | POA: Insufficient documentation

## 2011-12-30 DIAGNOSIS — E119 Type 2 diabetes mellitus without complications: Secondary | ICD-10-CM | POA: Insufficient documentation

## 2011-12-30 DIAGNOSIS — R000 Tachycardia, unspecified: Secondary | ICD-10-CM | POA: Insufficient documentation

## 2011-12-30 MED ORDER — TECHNETIUM TC 99M TETROFOSMIN IV KIT
33.0000 | PACK | Freq: Once | INTRAVENOUS | Status: AC | PRN
  Administered 2011-12-30: 33 via INTRAVENOUS

## 2011-12-30 MED ORDER — TECHNETIUM TC 99M TETROFOSMIN IV KIT
11.0000 | PACK | Freq: Once | INTRAVENOUS | Status: AC | PRN
  Administered 2011-12-30: 11 via INTRAVENOUS

## 2011-12-30 MED ORDER — REGADENOSON 0.4 MG/5ML IV SOLN
0.4000 mg | Freq: Once | INTRAVENOUS | Status: AC
  Administered 2011-12-30: 0.4 mg via INTRAVENOUS

## 2011-12-30 NOTE — Progress Notes (Signed)
[facility] SITE 3 NUCLEAR MED [address] [place] [address] [phone]  Cardiology Nuclear Med Study  Susan Whitaker is a 41 y.o. female     MRN : [id number]     DOB: [birth year]  Procedure Date: 12/30/2011  Nuclear Med Background Indication for Stress Test:  Evaluation for Ischemia, 12/14/11 ER- SVT, CP neg. enzymes History:  12/22/2011 Abnormal GXT Cardiac Risk Factors: Hypertension, Lipids and NIDDM  Symptoms:  Chest Pain, Dizziness and Rapid HR   Nuclear Pre-Procedure Caffeine/Decaff Intake:  10:00pm (tea) NPO After: 10:00pm   Lungs:  clear O2 Sat: 98% on room air. IV 0.9% NS with Angio Cath:  22g  IV Site: L Antecubital x 1, tolerated well IV Started by:  Irean Hong, RN  Chest Size (in):  38 Cup Size: DD  Height: 5\' 2"  (1.575 m)  Weight:  209 lb (94.802 kg)  BMI:  Body mass index is 38.23 kg/(m^2). Tech Comments:  Held metformin and lopressor this am     Nuclear Med Study 1 or 2 day study: 1 day  Stress Test Type:  Lexiscan  Reading MD: Dietrich Pates, MD  Order Authorizing Provider:  Lewayne Bunting, MD, and Tereso Newcomer, [facility]  Resting Radionuclide: Technetium 62m Tetrofosmin  Resting Radionuclide Dose: 11.0 mCi   Stress Radionuclide:  Technetium 75m Tetrofosmin  Stress Radionuclide Dose: 33.0 mCi           Stress Protocol Rest HR: 68 Stress HR: 117  Rest BP: 157/94 Stress BP: 173/88  Exercise Time (min): 2:00 METS: 1.6   Predicted Max HR: 178 bpm % Max HR: 65.73 bpm Rate Pressure Product: 62952   Dose of Adenosine (mg):  n/a Dose of Lexiscan: 0.4 mg  Dose of Atropine (mg): n/a Dose of Dobutamine: n/a mcg/kg/min (at max HR)  Stress Test Technologist: Bonnita Levan, RN  Nuclear Technologist:  Domenic Polite, CNMT     Rest Procedure:  Myocardial perfusion imaging was performed at rest 45 minutes following the intravenous administration of Technetium 2m Tetrofosmin. Rest ECG: Sinus Rhythm with T wave abnormality  Stress Procedure:  The  patient received IV Lexiscan 0.4 mg over 15-seconds with concurrent low level exercise and then Technetium 42m Tetrofosmin was injected at 30-seconds while the patient continued walking one more minute. There were non specific ST-T  changes with Lexiscan. Quantitative spect images were obtained after a 45-minute delay. Stress ECG: No significant change from baseline ECG.  There are nonspecific ST-T wave changes at rest.  QPS Raw Data Images:  Patient motion noted; appropriate software correction applied. Stress Images:  Normal homogeneous uptake in all areas of the myocardium. Rest Images:  Normal homogeneous uptake in all areas of the myocardium. Subtraction (SDS):  No evidence of ischemia. Transient Ischemic Dilatation (Normal <1.22):  1.06 Lung/Heart Ratio (Normal <0.45):  0.27  Quantitative Gated Spect Images QGS EDV:  82 ml QGS ESV:  26 ml  Impression Exercise Capacity:  Lexiscan with low level exercise. BP Response:  Normal blood pressure response. Clinical Symptoms:  No significant symptoms noted. ECG Impression:  No significant ST segment change suggestive of ischemia. Comparison with Prior Nuclear Study: No images to compare  Overall Impression:  Normal stress nuclear study.  LV Ejection Fraction: 69%.  LV Wall Motion:  Normal Wall Motion.  [name]

## 2011-12-31 ENCOUNTER — Telehealth: Payer: Self-pay | Admitting: *Deleted

## 2011-12-31 ENCOUNTER — Other Ambulatory Visit (HOSPITAL_BASED_OUTPATIENT_CLINIC_OR_DEPARTMENT_OTHER): Payer: 59

## 2011-12-31 NOTE — Telephone Encounter (Signed)
pt notified of stress test results today and gave verbal understanding

## 2011-12-31 NOTE — Telephone Encounter (Signed)
Message copied by Tarri Fuller on Fri Dec 31, 2011  2:39 PM ------      Message from: [place], [place] T      Created: Fri Dec 31, 2011  1:47 PM       Please inform patient stress test normal.      Tereso Newcomer, PA-C  1:47 PM 12/31/2011

## 2012-01-03 ENCOUNTER — Ambulatory Visit (HOSPITAL_BASED_OUTPATIENT_CLINIC_OR_DEPARTMENT_OTHER)
Admission: RE | Admit: 2012-01-03 | Discharge: 2012-01-03 | Disposition: A | Discharge status: Home / Self Care | Payer: 59 | Source: Ambulatory Visit | Attending: Family | Admitting: Family

## 2012-01-03 DIAGNOSIS — E042 Nontoxic multinodular goiter: Secondary | ICD-10-CM | POA: Insufficient documentation

## 2012-01-03 DIAGNOSIS — E049 Nontoxic goiter, unspecified: Secondary | ICD-10-CM

## 2012-01-04 ENCOUNTER — Telehealth: Payer: Self-pay | Admitting: Family

## 2012-01-04 DIAGNOSIS — E042 Nontoxic multinodular goiter: Secondary | ICD-10-CM | POA: Insufficient documentation

## 2012-01-04 NOTE — Telephone Encounter (Signed)
Spoke with pt re: ultrasound.  TSH normal.  Will plan follow up ultrasound in 12 months. Pt verbalizes understanding.

## 2012-01-11 ENCOUNTER — Other Ambulatory Visit (HOSPITAL_COMMUNITY): Payer: Self-pay | Admitting: Internal Medicine

## 2012-01-11 DIAGNOSIS — I471 Supraventricular tachycardia: Secondary | ICD-10-CM

## 2012-01-19 ENCOUNTER — Ambulatory Visit: Payer: 59 | Admitting: Family

## 2012-01-19 ENCOUNTER — Ambulatory Visit (INDEPENDENT_AMBULATORY_CARE_PROVIDER_SITE_OTHER): Payer: 59 | Admitting: Internal Medicine

## 2012-01-19 ENCOUNTER — Ambulatory Visit (HOSPITAL_COMMUNITY): Payer: 59 | Attending: Internal Medicine

## 2012-01-19 ENCOUNTER — Encounter: Payer: Self-pay | Admitting: Internal Medicine

## 2012-01-19 VITALS — BP 136/96 | HR 56 | Ht 62.5 in | Wt 211.8 lb

## 2012-01-19 DIAGNOSIS — I471 Supraventricular tachycardia, unspecified: Secondary | ICD-10-CM | POA: Insufficient documentation

## 2012-01-19 DIAGNOSIS — Z0289 Encounter for other administrative examinations: Secondary | ICD-10-CM

## 2012-01-19 DIAGNOSIS — I379 Nonrheumatic pulmonary valve disorder, unspecified: Secondary | ICD-10-CM | POA: Insufficient documentation

## 2012-01-19 DIAGNOSIS — E119 Type 2 diabetes mellitus without complications: Secondary | ICD-10-CM | POA: Insufficient documentation

## 2012-01-19 DIAGNOSIS — I079 Rheumatic tricuspid valve disease, unspecified: Secondary | ICD-10-CM | POA: Insufficient documentation

## 2012-01-19 DIAGNOSIS — I080 Rheumatic disorders of both mitral and aortic valves: Secondary | ICD-10-CM | POA: Insufficient documentation

## 2012-01-19 DIAGNOSIS — I10 Essential (primary) hypertension: Secondary | ICD-10-CM | POA: Insufficient documentation

## 2012-01-19 NOTE — Progress Notes (Signed)
Echocardiogram performed.  

## 2012-01-19 NOTE — Patient Instructions (Signed)
Your physician wants you to follow-up in: 6 months with Dr Taylor You will receive a reminder letter in the mail two months in advance. If you don't receive a letter, please call our office to schedule the follow-up appointment.  

## 2012-01-20 ENCOUNTER — Encounter: Payer: Self-pay | Admitting: Internal Medicine

## 2012-01-20 NOTE — Progress Notes (Signed)
HPI Susan Whitaker returns today for evaluation of SVT. She is a pleasant 41 yo woman with a h/o SVT, requiring IV adenosine for termination. She also has had associated chest pain with her SVT and subsequent evaluation has been unrevealing. She denies syncope. She  Has been placed on beta blocker therapy and her symptoms are much improved. She has not had peripheral edema.  Allergies  Allergen Reactions  . Lisinopril     REACTION: ACE cough     Current Outpatient Prescriptions  Medication Sig Dispense Refill  . aspirin EC 81 MG EC tablet Take 1 tablet (81 mg total) by mouth daily.      Marland Kitchen glucose blood (FREESTYLE LITE) test strip Use as instructed  100 each  2  . Lancets (FREESTYLE) lancets Use to check blood sugar once a day as instructed       . losartan (COZAAR) 50 MG tablet Take 1 tablet (50 mg total) by mouth daily.  30 tablet  5  . lovastatin (MEVACOR) 20 MG tablet Take 20 mg by mouth every evening.       . metFORMIN (GLUCOPHAGE) 500 MG tablet Take 500 mg by mouth 2 (two) times daily with a meal.        . metoprolol (LOPRESSOR) 50 MG tablet Take 1 tablet (50 mg total) by mouth 2 (two) times daily.  60 tablet  0  . Naproxen Sodium 750 MG TB24 Take 750 mg by mouth as needed. with food      . potassium chloride SA (K-DUR,KLOR-CON) 20 MEQ tablet Take 2 tablets (40 mEq total) by mouth 2 (two) times daily.  60 tablet  2  . spironolactone (ALDACTONE) 25 MG tablet Take 25 mg by mouth daily.           Past Medical History  Diagnosis Date  . GERD (gastroesophageal reflux disease)   . Hypertension   . Gastric ulcer   . Diabetes mellitus     type II  . Urinary incontinence   . Thrombocytopenia   . Hyperlipidemia   . Allergy     allergic rhinitis  . Hypokalemia   . H/O hemorrhoids   . H/O constipation   . FHx: hypertension   . H/O varicella   . Thyroid fullness 08/2005  . Increased BMI 06/2010  . SVT (supraventricular tachycardia)     Noted 11/2011 admission    ROS:   All systems  reviewed and negative except as noted in the HPI.   Past Surgical History  Procedure Date  . Abdominal surgery     35 months of age-- unsure of type of surgery  . Hemorrhoid surgery 2005/2006  . Uterine fibroid embolization      Family History  Problem Relation Age of Onset  . Cancer Other     breast, lung  . Hypertension Other   . Stroke Other   . Cancer Mother     breast  . Stroke Mother   . Cancer Maternal Grandmother     breast  . Hypertension Maternal Grandmother   . Vision loss Father   . Heart disease Father     Rhythm disturbance     History   Social History  . Marital Status: Single    Spouse Name: N/A    Number of Children: 2  . Years of Education: N/A   Occupational History  . 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 Smoking status: Never Smoker   . Smokeless tobacco: Never  Used  . Alcohol Use: No  . Drug Use: No  . Sexually Active: Yes -- Female partner(s)    Birth Control/ Protection: None   Other Topics Concern  . Not on file   Social History Narrative  . No narrative on file     BP 136/96  Pulse 56  Ht 5' 2.5" (1.588 m)  Wt 211 lb 12.8 oz (96.072 kg)  BMI 38.12 kg/m2  Physical Exam:  Well appearing 41 yo woman, NAD HEENT: Unremarkable Neck:  No JVD, no thyromegally Lungs:  Clear with no wheezes, rales, or rhonchi. HEART:  Regular rate rhythm, no murmurs, no rubs, no clicks Abd:  soft, positive bowel sounds, no organomegally, no rebound, no guarding Ext:  2 plus pulses, no edema, no cyanosis, no clubbing Skin:  No rashes no nodules Neuro:  CN II through XII intact, motor grossly intact  EKG Sinus bradycardia with nonspecific T wave abnormality.   Assess/Plan:

## 2012-01-20 NOTE — Assessment & Plan Note (Signed)
I have discussed the treatment options. She has been failry well controlled for now with regard to her symptoms of SVT. She would like to continue her medical therapy. The risks/benefits/goals/expectations of ablation have been discussed with the patient and she will call us if she would like to proceed with ablation.

## 2012-02-14 ENCOUNTER — Encounter: Payer: Self-pay | Admitting: Family

## 2012-02-14 ENCOUNTER — Ambulatory Visit (INDEPENDENT_AMBULATORY_CARE_PROVIDER_SITE_OTHER): Payer: 59 | Admitting: Family

## 2012-02-14 VITALS — BP 156/95 | HR 92 | Temp 98.4°F | Resp 16 | Ht 62.0 in | Wt 216.0 lb

## 2012-02-14 DIAGNOSIS — I471 Supraventricular tachycardia: Secondary | ICD-10-CM

## 2012-02-14 DIAGNOSIS — I10 Essential (primary) hypertension: Secondary | ICD-10-CM

## 2012-02-14 DIAGNOSIS — E876 Hypokalemia: Secondary | ICD-10-CM

## 2012-02-14 DIAGNOSIS — E042 Nontoxic multinodular goiter: Secondary | ICD-10-CM

## 2012-02-14 DIAGNOSIS — E785 Hyperlipidemia, unspecified: Secondary | ICD-10-CM

## 2012-02-14 DIAGNOSIS — E119 Type 2 diabetes mellitus without complications: Secondary | ICD-10-CM

## 2012-02-14 DIAGNOSIS — R7309 Other abnormal glucose: Secondary | ICD-10-CM

## 2012-02-14 DIAGNOSIS — Z23 Encounter for immunization: Secondary | ICD-10-CM

## 2012-02-14 DIAGNOSIS — I498 Other specified cardiac arrhythmias: Secondary | ICD-10-CM

## 2012-02-14 MED ORDER — LOVASTATIN 20 MG PO TABS: 20.0000 mg | ORAL_TABLET | Freq: Every evening | ORAL | Status: DC

## 2012-02-14 MED ORDER — GLUCOSE BLOOD VI STRP: ORAL_STRIP | Status: AC

## 2012-02-14 MED ORDER — LOSARTAN POTASSIUM 50 MG PO TABS: 50.0000 mg | ORAL_TABLET | Freq: Every day | ORAL | Status: DC

## 2012-02-14 MED ORDER — METOPROLOL TARTRATE 50 MG PO TABS: 75.0000 mg | ORAL_TABLET | Freq: Two times a day (BID) | ORAL | Status: DC

## 2012-02-14 MED ORDER — POTASSIUM CHLORIDE CRYS ER 20 MEQ PO TBCR: 40.0000 meq | EXTENDED_RELEASE_TABLET | Freq: Two times a day (BID) | ORAL | Status: DC

## 2012-02-14 MED ORDER — METFORMIN HCL 500 MG PO TABS: 500.0000 mg | ORAL_TABLET | Freq: Two times a day (BID) | ORAL | Status: DC

## 2012-02-14 MED ORDER — SPIRONOLACTONE 25 MG PO TABS: 25.0000 mg | ORAL_TABLET | Freq: Every day | ORAL | Status: DC

## 2012-02-14 NOTE — Assessment & Plan Note (Signed)
She continues potassium supplement and is scheduled to see Dr. Ervin Knack (nephrology) tomorrow.

## 2012-02-14 NOTE — Assessment & Plan Note (Signed)
She will need follow up US  8/14 to ensure stability. TSH normal 7/31.

## 2012-02-14 NOTE — Assessment & Plan Note (Signed)
Reports that she is still having some brief episodes of svt, but not as severe as before.  Increase metoprolol from 50mg  bid to 75 mg bid for rate control and bp control. I have asked her to contact Dr. Ladona Ridgel if she has any further episodes.

## 2012-02-14 NOTE — Progress Notes (Signed)
Subjective:    Patient ID: Susan Whitaker, female    DOB: Apr 30, 1971, 41 y.o.   MRN: [id number]  HPI  [name] is a 41 yr old female who presents today for follow up.  HTN- last visit her blood pressure was noted to be elevated.  Toprol was increased from 25mg  to 50 mg. Took medication at 10 AM this AM.    BP Readings from Last 3 Encounters:  02/14/12 156/95  01/19/12 136/96  12/30/11 157/94     SVT- since her last visit she had a negative stress test and follow up with Dr. Sharrell Ku.  She has opted to be managed medically rather than ablation at this time.  Thyromegally- last visit she had a thyroid ultrasound which noted multiple small nodules.  A 12 month follow up was recommended.   Hypokalemia- last K+ had normalized.  Has appointment with Dr. Kathrene Bongo tomorrow.   DM2- She continue metformin. She reports that her sugar [place] usually just above 100.  Denies hypoglycemia, polyuria, polydipsia.  Review of Systems See HPI  Past Medical History  Diagnosis Date  . GERD (gastroesophageal reflux disease)   . Hypertension   . Gastric ulcer   . Diabetes mellitus     type II  . Urinary incontinence   . Thrombocytopenia   . Hyperlipidemia   . Allergy     allergic rhinitis  . Hypokalemia   . H/O hemorrhoids   . H/O constipation   . FHx: hypertension   . H/O varicella   . Thyroid fullness 08/2005  . Increased BMI 06/2010  . SVT (supraventricular tachycardia)     Noted 11/2011 admission    History   Social History  . Marital Status: Single    Spouse Name: N/A    Number of Children: 2  . Years of Education: N/A   Occupational History  . 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 Smoking status: Never Smoker   . Smokeless tobacco: Never Used  . Alcohol Use: No  . Drug Use: No  . Sexually Active: Yes -- Female partner(s)    Birth Control/ Protection: None   Other Topics Concern  . Not on file   Social History Narrative  . No narrative on file     Past Surgical History  Procedure Date  . Abdominal surgery     39 months of age-- unsure of type of surgery  . Hemorrhoid surgery 2005/2006  . Uterine fibroid embolization     Family History  Problem Relation Age of Onset  . Cancer Other     breast, lung  . Hypertension Other   . Stroke Other   . Cancer Mother     breast  . Stroke Mother   . Cancer Maternal Grandmother     breast  . Hypertension Maternal Grandmother   . Vision loss Father   . Heart disease Father     Rhythm disturbance    Allergies  Allergen Reactions  . Lisinopril     REACTION: ACE cough    Current Outpatient Prescriptions on File Prior to Visit  Medication Sig Dispense Refill  . aspirin EC 81 MG EC tablet Take 1 tablet (81 mg total) by mouth daily.      Marland Kitchen glucose blood (FREESTYLE LITE) test strip Use as instructed  100 each  2  . Lancets (FREESTYLE) lancets Use to check blood sugar once a day as instructed       . losartan (  COZAAR) 50 MG tablet Take 1 tablet (50 mg total) by mouth daily.  30 tablet  5  . lovastatin (MEVACOR) 20 MG tablet Take 20 mg by mouth every evening.       . metFORMIN (GLUCOPHAGE) 500 MG tablet Take 500 mg by mouth 2 (two) times daily with a meal.        . Naproxen Sodium 750 MG TB24 Take 750 mg by mouth as needed. with food      . potassium chloride SA (K-DUR,KLOR-CON) 20 MEQ tablet Take 2 tablets (40 mEq total) by mouth 2 (two) times daily.  60 tablet  2  . spironolactone (ALDACTONE) 25 MG tablet Take 25 mg by mouth daily.        Marland Kitchen DISCONTD: metoprolol (LOPRESSOR) 50 MG tablet Take 1 tablet (50 mg total) by mouth 2 (two) times daily.  60 tablet  0    BP 156/95  Pulse 92  Temp 98.4 F (36.9 C) (Oral)  Resp 16  Ht 5\' 2"  (1.575 m)  Wt 216 lb (97.977 kg)  BMI 39.51 kg/m2  SpO2 98%       Objective:   Physical Exam  Constitutional: She appears well-developed and well-nourished. No distress.  Cardiovascular: Normal rate and regular rhythm.   No murmur  heard. Pulmonary/Chest: Effort normal and breath sounds normal. No respiratory distress. She has no wheezes. She has no rales. She exhibits no tenderness.  Abdominal: Soft. Bowel sounds are normal.  Musculoskeletal: She exhibits no edema.  Psychiatric: She has a normal mood and affect. Her behavior is normal. Judgment and thought content normal.          Assessment & Plan:

## 2012-02-14 NOTE — Assessment & Plan Note (Addendum)
She is tolerating statin.  Needs FLP- I have asked her to come fasting to her next visit.

## 2012-02-14 NOTE — Assessment & Plan Note (Signed)
Clinically stable.  Obtain A1C, urine microalbumin. Refer to podiatry for onychomycosis/nail care. Pneumovax and flu shot today.

## 2012-02-14 NOTE — Assessment & Plan Note (Addendum)
BP remains elevated.  Increase beta blocker.  Recommended that she hold off on exercise until BP is better.

## 2012-02-14 NOTE — Patient Instructions (Addendum)
Increase your metoprolol to 1 and 1/2 tablets twice daily. Please complete your blood work prior to leaving.  You will be contact about your referral to podiatry.  Please let us know if you have not heard back within 1 week about your referral. Please schedule a follow up appointment in 1 month- come fasting to this visit.

## 2012-02-15 ENCOUNTER — Encounter: Payer: Self-pay | Admitting: Family

## 2012-02-15 DIAGNOSIS — R809 Proteinuria, unspecified: Secondary | ICD-10-CM | POA: Insufficient documentation

## 2012-02-15 HISTORY — DX: Proteinuria, unspecified: R80.9

## 2012-02-15 LAB — HEMOGLOBIN A1C
Hgb A1c MFr Bld: 6.8 % — ABNORMAL HIGH (ref <5.7)
Mean Plasma Glucose: 148 mg/dL — ABNORMAL HIGH (ref <117)

## 2012-02-15 LAB — MICROALBUMIN / CREATININE URINE RATIO
Creatinine, Urine: 212.8 mg/dL
Microalb Creat Ratio: 29.8 mg/g (ref 0.0–30.0)
Microalb, Ur: 6.35 mg/dL — ABNORMAL HIGH (ref 0.00–1.89)

## 2012-02-21 ENCOUNTER — Encounter: Payer: Self-pay | Admitting: Family

## 2012-02-21 ENCOUNTER — Telehealth: Payer: Self-pay | Admitting: Family

## 2012-02-21 DIAGNOSIS — E2609 Other primary hyperaldosteronism: Secondary | ICD-10-CM

## 2012-02-21 HISTORY — DX: Other primary hyperaldosteronism: E26.09

## 2012-02-21 NOTE — Telephone Encounter (Signed)
See med change. Needs follow up bmet per Dr. Ervin Knack at her apt later this month.

## 2012-02-28 ENCOUNTER — Telehealth: Payer: Self-pay | Admitting: Family

## 2012-02-28 NOTE — Telephone Encounter (Signed)
Left detailed message on pt's cell# and to call if any questions. 

## 2012-02-28 NOTE — Telephone Encounter (Signed)
Please let pt know that I reviewed with Dr. Ladona Ridgel and he told me that her diagnosis would not qualify her for cardiac rehab.

## 2012-02-28 NOTE — Telephone Encounter (Signed)
Message copied by Sandford Craze on Mon Feb 28, 2012  8:12 AM ------      Message from: Marinus Maw      Created: Sun Feb 27, 2012 10:13 PM       Ok to increase bp meds. Her diagnosis would not get her into cardiac rehab.      ----- Message -----         From: Sandford Craze, NP         Sent: 02/14/2012   3:45 PM           To: Marinus Maw, MD            Dr. Ladona Ridgel,            Pt's blood pressure was up in the office today.  Reports a couple of episodes or rapid heart rate in the last few week twice while walking and once at rest.  I am increasing toprol from 50 to 75 bid. She is wondering if she would candidate for cardiac rehab.  I told her I did not know but I would ask you.            Thanks,            Meliss

## 2012-03-15 ENCOUNTER — Ambulatory Visit: Payer: 59 | Admitting: Family

## 2012-03-20 ENCOUNTER — Encounter: Payer: Self-pay | Admitting: Family

## 2012-03-20 ENCOUNTER — Ambulatory Visit (INDEPENDENT_AMBULATORY_CARE_PROVIDER_SITE_OTHER): Payer: 59 | Admitting: Family

## 2012-03-20 VITALS — BP 154/94 | HR 75 | Temp 99.0°F | Resp 16 | Ht 62.0 in | Wt 216.0 lb

## 2012-03-20 DIAGNOSIS — I10 Essential (primary) hypertension: Secondary | ICD-10-CM

## 2012-03-20 DIAGNOSIS — I498 Other specified cardiac arrhythmias: Secondary | ICD-10-CM

## 2012-03-20 DIAGNOSIS — I471 Supraventricular tachycardia, unspecified: Secondary | ICD-10-CM

## 2012-03-20 DIAGNOSIS — Z0000 Encounter for general adult medical examination without abnormal findings: Secondary | ICD-10-CM

## 2012-03-20 DIAGNOSIS — E269 Hyperaldosteronism, unspecified: Secondary | ICD-10-CM

## 2012-03-20 DIAGNOSIS — E2609 Other primary hyperaldosteronism: Secondary | ICD-10-CM

## 2012-03-20 LAB — LIPID PANEL
Cholesterol: 182 mg/dL (ref 0–200)
HDL: 36 mg/dL — ABNORMAL LOW (ref >39)
LDL Cholesterol: 119 mg/dL — ABNORMAL HIGH (ref 0–99)
Total CHOL/HDL Ratio: 5.1 Ratio
Triglycerides: 134 mg/dL (ref <150)
VLDL: 27 mg/dL (ref 0–40)

## 2012-03-20 MED ORDER — AMLODIPINE BESYLATE 5 MG PO TABS: 5.0000 mg | ORAL_TABLET | Freq: Every day | ORAL | Status: DC

## 2012-03-20 NOTE — Assessment & Plan Note (Signed)
Clinically stable on beta blocker.  Management per cards.

## 2012-03-20 NOTE — Assessment & Plan Note (Signed)
My follow up BP check was 146/94.  Goal is 130/80.  Will increase losartan from 25mg  to 50mg  and plan to repeat bp and bmet next visit.

## 2012-03-20 NOTE — Progress Notes (Signed)
Subjective:    Patient ID: Susan Whitaker, female    DOB: 10-08-1970, 41 y.o.   MRN: [id number]  HPI  Susan Whitaker is a 41 yr old female who presents today for follow up.  1) HTN-  Last visit her beta blocker was increased. She reports tolerating med change without difficulty.  2) Hypokalemia- she established with Dr. Kathrene Bongo.  She continues on potassium supplement and spironolactone. She did not change her medications. She is to follow up in 6 months.   3) Hyperlipidemia- she is fasting today.   4) DM2- A1C was at goal last visit at 6.8. She continues metformin.   5) SVT- reports that since last visit she has had no further issues with palpitations.      Review of Systems See HPI  Past Medical History  Diagnosis Date  . GERD (gastroesophageal reflux disease)   . Hypertension   . Gastric ulcer   . Diabetes mellitus     type II  . Urinary incontinence   . Thrombocytopenia   . Hyperlipidemia   . Allergy     allergic rhinitis  . Hypokalemia   . H/O hemorrhoids   . H/O constipation   . FHx: hypertension   . H/O varicella   . Thyroid fullness 08/2005  . Increased BMI 06/2010  . SVT (supraventricular tachycardia)     Noted 11/2011 admission  . Primary hyperaldosteronism 02/21/2012    History   Social History  . Marital Status: Single    Spouse Name: N/A    Number of Children: 2  . Years of Education: N/A   Occupational History  . 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 Smoking status: Never Smoker   . Smokeless tobacco: Never Used  . Alcohol Use: No  . Drug Use: No  . Sexually Active: Yes -- Female partner(s)    Birth Control/ Protection: None   Other Topics Concern  . Not on file   Social History Narrative  . No narrative on file    Past Surgical History  Procedure Date  . Abdominal surgery     63 months of age-- unsure of type of surgery  . Hemorrhoid surgery 2005/2006  . Uterine fibroid embolization     Family History  Problem  Relation Age of Onset  . Cancer Other     breast, lung  . Hypertension Other   . Stroke Other   . Cancer Mother     breast  . Stroke Mother   . Cancer Maternal Grandmother     breast  . Hypertension Maternal Grandmother   . Vision loss Father   . Heart disease Father     Rhythm disturbance    Allergies  Allergen Reactions  . Lisinopril     REACTION: ACE cough    Current Outpatient Prescriptions on File Prior to Visit  Medication Sig Dispense Refill  . aspirin EC 81 MG EC tablet Take 1 tablet (81 mg total) by mouth daily.      Marland Kitchen glucose blood (FREESTYLE LITE) test strip Use as instructed  100 each  2  . Lancets (FREESTYLE) lancets Use to check blood sugar once a day as instructed       . losartan (COZAAR) 50 MG tablet Take 1 tablet (50 mg total) by mouth daily.  90 tablet  1  . lovastatin (MEVACOR) 20 MG tablet Take 1 tablet (20 mg total) by mouth every evening.  90 tablet  1  . metFORMIN (  GLUCOPHAGE) 500 MG tablet Take 1 tablet (500 mg total) by mouth 2 (two) times daily with a meal.  180 tablet  1  . metoprolol (LOPRESSOR) 50 MG tablet Take 1.5 tablets (75 mg total) by mouth 2 (two) times daily.  270 tablet  1  . Naproxen Sodium 750 MG TB24 Take 750 mg by mouth as needed. with food      . potassium chloride SA (K-DUR,KLOR-CON) 20 MEQ tablet Take 2 tablets (40 mEq total) by mouth 2 (two) times daily.  60 tablet  2  . spironolactone (ALDACTONE) 25 MG tablet Take 25 mg by mouth 2 (two) times daily.      Marland Kitchen amLODipine (NORVASC) 5 MG tablet Take 1 tablet (5 mg total) by mouth daily.  30 tablet  1    BP 154/94  Pulse 75  Temp 99 F (37.2 C) (Oral)  Resp 16  Ht 5\' 2"  (1.575 m)  Wt 216 lb 0.6 oz (97.995 kg)  BMI 39.51 kg/m2  SpO2 99%  LMP 02/29/2012       Objective:   Physical Exam  Constitutional: She is oriented to person, place, and time. She appears well-developed and well-nourished. No distress.  HENT:  Head: Normocephalic.  Cardiovascular: Normal rate and regular  rhythm.   No murmur heard. Pulmonary/Chest: Effort normal and breath sounds normal. No respiratory distress. She has no wheezes. She has no rales. She exhibits no tenderness.  Musculoskeletal: She exhibits no edema.  Neurological: She is alert and oriented to person, place, and time.  Skin: Skin is warm and dry. No rash noted. No erythema. No pallor.  Psychiatric: She has a normal mood and affect. Her behavior is normal. Thought content normal.          Assessment & Plan:   BP Readings from Last 3 Encounters:  03/20/12 154/94  02/14/12 156/95  01/19/12 136/96

## 2012-03-20 NOTE — Patient Instructions (Addendum)
Please schedule a follow up appointment in 1 month.

## 2012-03-20 NOTE — Assessment & Plan Note (Signed)
Management per Dr. Kathrene Bongo.  Clinically stable.

## 2012-03-21 ENCOUNTER — Encounter: Payer: Self-pay | Admitting: Family

## 2012-04-11 ENCOUNTER — Ambulatory Visit (INDEPENDENT_AMBULATORY_CARE_PROVIDER_SITE_OTHER): Payer: 59 | Admitting: Obstetrics and Gynecology

## 2012-04-11 ENCOUNTER — Telehealth: Payer: Self-pay | Admitting: Obstetrics and Gynecology

## 2012-04-11 ENCOUNTER — Encounter: Payer: Self-pay | Admitting: Obstetrics and Gynecology

## 2012-04-11 VITALS — BP 148/90 | Temp 99.2°F | Wt 218.0 lb

## 2012-04-11 DIAGNOSIS — Z309 Encounter for contraceptive management, unspecified: Secondary | ICD-10-CM

## 2012-04-11 DIAGNOSIS — B3731 Acute candidiasis of vulva and vagina: Secondary | ICD-10-CM

## 2012-04-11 DIAGNOSIS — B373 Candidiasis of vulva and vagina: Secondary | ICD-10-CM

## 2012-04-11 DIAGNOSIS — IMO0001 Reserved for inherently not codable concepts without codable children: Secondary | ICD-10-CM

## 2012-04-11 DIAGNOSIS — N949 Unspecified condition associated with female genital organs and menstrual cycle: Secondary | ICD-10-CM

## 2012-04-11 DIAGNOSIS — N390 Urinary tract infection, site not specified: Secondary | ICD-10-CM

## 2012-04-11 DIAGNOSIS — R102 Pelvic and perineal pain: Secondary | ICD-10-CM

## 2012-04-11 LAB — POCT URINALYSIS DIPSTICK
Bilirubin, UA: NEGATIVE
Ketones, UA: NEGATIVE
Nitrite, UA: NEGATIVE
Spec Grav, UA: <=1.005
Urobilinogen, UA: NEGATIVE
pH, UA: 7

## 2012-04-11 LAB — POCT URINE PREGNANCY: Preg Test, Ur: NEGATIVE

## 2012-04-11 MED ORDER — AMBULATORY NON FORMULARY MEDICATION: 600.0000 mg | Status: DC

## 2012-04-11 MED ORDER — FLUCONAZOLE 150 MG PO TABS: 150.0000 mg | ORAL_TABLET | Freq: Once | ORAL | Status: AC

## 2012-04-11 MED ORDER — CIPROFLOXACIN HCL 500 MG PO TABS: 500.0000 mg | ORAL_TABLET | Freq: Two times a day (BID) | ORAL | Status: DC

## 2012-04-11 NOTE — Progress Notes (Signed)
Contraception: NONE History of STD:  no history of PID, STD's History of ovarian cyst: no History of fibroids: yes:   History of endometriosis:no Previous ultrasound:no  Urinary symptoms: urinary frequency Gastro-intestinal symptoms:  Constipation: yes     Diarrhea: no     Nausea: no     Vomiting: no     Fever: no Vaginal discharge: no vaginal discharge

## 2012-04-11 NOTE — Patient Instructions (Signed)
[place] pharmacies that carry Boric Acid Capsules/Suppositories:  [facility] [address] (only)  [phone]  [facility]  [address] ([facility])  [phone]  [facility] ([place])  [address] [phone]  [facility]  [address] [phone]  [facility] [address] [place], [place] [phone]     

## 2012-04-11 NOTE — Progress Notes (Signed)
41 YO complains of urinary frequency for the past 2 weeks with no relief from cranberry juice.  Denies any nausea, vomiting, flank pain or fever.  Patient also complains of vaginal itching for one week.    O:  U/A:  SG-1.005,  pH-7.0, leukocytes & blood 2+ Wet Prep:  pH-5.0,  whiff-negative, many yeast  A: UTI     Candida Vaginitis  P:  urine culture-pending       Boric Acid Suppositories 600 mg # 30 1 pv twice weekly as directed 11 refills       Perineal hygiene       Cipro 500 mg # 6 bid x 3 days no refills       Diflucan 150 mg #1 1 po stat 1 refills       RTO-as scheduled or prn  Aleisa Howk, PA-C

## 2012-04-14 LAB — URINE CULTURE: Colony Count: 40000

## 2012-04-21 ENCOUNTER — Ambulatory Visit: Payer: 59 | Admitting: Family

## 2012-04-21 DIAGNOSIS — Z0289 Encounter for other administrative examinations: Secondary | ICD-10-CM

## 2012-05-18 ENCOUNTER — Ambulatory Visit: Payer: 59 | Admitting: Obstetrics and Gynecology

## 2012-06-02 ENCOUNTER — Encounter: Payer: 59 | Admitting: Family

## 2012-06-06 ENCOUNTER — Encounter: Payer: Self-pay | Admitting: Family

## 2012-06-06 ENCOUNTER — Ambulatory Visit (INDEPENDENT_AMBULATORY_CARE_PROVIDER_SITE_OTHER): Payer: 59 | Admitting: Family

## 2012-06-06 VITALS — BP 160/80 | HR 69 | Temp 99.2°F | Resp 16 | Wt 213.1 lb

## 2012-06-06 DIAGNOSIS — R509 Fever, unspecified: Secondary | ICD-10-CM

## 2012-06-06 DIAGNOSIS — J020 Streptococcal pharyngitis: Secondary | ICD-10-CM

## 2012-06-06 DIAGNOSIS — J029 Acute pharyngitis, unspecified: Secondary | ICD-10-CM

## 2012-06-06 DIAGNOSIS — I10 Essential (primary) hypertension: Secondary | ICD-10-CM

## 2012-06-06 LAB — POCT INFLUENZA A/B
Influenza A, POC: NEGATIVE
Influenza B, POC: NEGATIVE

## 2012-06-06 LAB — POCT RAPID STREP A (OFFICE): Rapid Strep A Screen: POSITIVE — AB

## 2012-06-06 MED ORDER — AMOXICILLIN 500 MG PO CAPS: 1000.0000 mg | ORAL_CAPSULE | Freq: Three times a day (TID) | ORAL | Status: DC

## 2012-06-06 NOTE — Patient Instructions (Addendum)

## 2012-06-06 NOTE — Progress Notes (Signed)
Subjective:    Patient ID: Susan Whitaker, female    DOB: 09/12/1970, 42 y.o.   MRN: [id number]  HPI  Susan Whitaker is a 42 yr old female who presents today with fever. Symptoms started Friday. Tmax was 102 on Sunday.    She has multiple complaints including cough, headache, nasal drainage, sore throat and mild chills.  She had a flu shot.  Denies known sick contacts at home or work.   Review of Systems See HPI  Past Medical History  Diagnosis Date  . GERD (gastroesophageal reflux disease)   . Hypertension   . Gastric ulcer   . Diabetes mellitus     type II  . Urinary incontinence   . Thrombocytopenia   . Hyperlipidemia   . Allergy     allergic rhinitis  . Hypokalemia   . H/O hemorrhoids   . H/O constipation   . FHx: hypertension   . H/O varicella   . Thyroid fullness 08/2005  . Increased BMI 06/2010  . SVT (supraventricular tachycardia)     Noted 11/2011 admission  . Primary hyperaldosteronism 02/21/2012    History   Social History  . Marital Status: Single    Spouse Name: N/A    Number of Children: 2  . Years of Education: N/A   Occupational History  . 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 Smoking status: Never Smoker   . Smokeless tobacco: Never Used  . Alcohol Use: No  . Drug Use: No  . Sexually Active: Yes -- Female partner(s)    Birth Control/ Protection: None   Other Topics Concern  . Not on file   Social History Narrative  . No narrative on file    Past Surgical History  Procedure Date  . Abdominal surgery     74 months of age-- unsure of type of surgery  . Hemorrhoid surgery 2005/2006  . Uterine fibroid embolization     Family History  Problem Relation Age of Onset  . Cancer Other     breast, lung  . Hypertension Other   . Stroke Other   . Cancer Mother     breast  . Stroke Mother   . Cancer Maternal Grandmother     breast  . Hypertension Maternal Grandmother   . Vision loss Father   . Heart disease Father     Rhythm  disturbance    Allergies  Allergen Reactions  . Lisinopril     REACTION: ACE cough    Current Outpatient Prescriptions on File Prior to Visit  Medication Sig Dispense Refill  . AMBULATORY NON FORMULARY MEDICATION Place 600 mg vaginally 2 (two) times a week. Medication Name: Boric Acid Suppository 600 mg 1 pv twice weekly as directed  30 suppository  11  . amLODipine (NORVASC) 5 MG tablet Take 1 tablet (5 mg total) by mouth daily.  30 tablet  1  . aspirin EC 81 MG EC tablet Take 1 tablet (81 mg total) by mouth daily.      . ciprofloxacin (CIPRO) 500 MG tablet Take 1 tablet (500 mg total) by mouth 2 (two) times daily.  6 tablet  0  . glucose blood (FREESTYLE LITE) test strip Use as instructed  100 each  2  . Lancets (FREESTYLE) lancets Use to check blood sugar once a day as instructed       . losartan (COZAAR) 50 MG tablet Take 1 tablet (50 mg total) by mouth daily.  90 tablet  1  . lovastatin (MEVACOR) 20 MG tablet Take 1 tablet (20 mg total) by mouth every evening.  90 tablet  1  . metFORMIN (GLUCOPHAGE) 500 MG tablet Take 1 tablet (500 mg total) by mouth 2 (two) times daily with a meal.  180 tablet  1  . metoprolol (LOPRESSOR) 50 MG tablet Take 1.5 tablets (75 mg total) by mouth 2 (two) times daily.  270 tablet  1  . Naproxen Sodium 750 MG TB24 Take 750 mg by mouth as needed. with food      . potassium chloride SA (K-DUR,KLOR-CON) 20 MEQ tablet Take 2 tablets (40 mEq total) by mouth 2 (two) times daily.  60 tablet  2  . spironolactone (ALDACTONE) 25 MG tablet Take 25 mg by mouth 2 (two) times daily.        BP 160/80  Pulse 69  Temp 99.2 F (37.3 C) (Oral)  Resp 16  Wt 213 lb 1.9 oz (96.671 kg)  SpO2 99%       Objective:   Physical Exam  Constitutional: She is oriented to person, place, and time. She appears well-developed and well-nourished. No distress.  HENT:  Head: Normocephalic and atraumatic.  Right Ear: Tympanic membrane and ear canal normal.  Left Ear: Tympanic  membrane and ear canal normal.  Mouth/Throat: Posterior oropharyngeal erythema present. No oropharyngeal exudate or posterior oropharyngeal edema.  Eyes: No scleral icterus.  Neck:       Mild cervical LAD  Cardiovascular: Normal rate and regular rhythm.   No murmur heard. Pulmonary/Chest: Effort normal and breath sounds normal. No respiratory distress. She has no wheezes. She has no rales. She exhibits no tenderness.  Musculoskeletal: She exhibits no edema.  Neurological: She is alert and oriented to person, place, and time.  Skin: Skin is warm and dry.  Psychiatric: She has a normal mood and affect. Her behavior is normal. Judgment and thought content normal.          Assessment & Plan:

## 2012-06-06 NOTE — Assessment & Plan Note (Signed)
BP up today, pt reports that she has not taken her BP meds.  Reminded pt to take bp meds regularly.

## 2012-06-09 DIAGNOSIS — J020 Streptococcal pharyngitis: Secondary | ICD-10-CM | POA: Insufficient documentation

## 2012-06-09 NOTE — Assessment & Plan Note (Signed)
Rx with amoxicillin. 

## 2012-06-12 ENCOUNTER — Encounter: Payer: Self-pay | Admitting: Obstetrics and Gynecology

## 2012-06-12 ENCOUNTER — Ambulatory Visit: Payer: 59 | Admitting: Obstetrics and Gynecology

## 2012-06-12 VITALS — BP 146/80 | Ht 62.0 in | Wt 207.0 lb

## 2012-06-12 DIAGNOSIS — Z01419 Encounter for gynecological examination (general) (routine) without abnormal findings: Secondary | ICD-10-CM

## 2012-06-12 DIAGNOSIS — Z124 Encounter for screening for malignant neoplasm of cervix: Secondary | ICD-10-CM

## 2012-06-12 DIAGNOSIS — Z113 Encounter for screening for infections with a predominantly sexual mode of transmission: Secondary | ICD-10-CM

## 2012-06-12 DIAGNOSIS — R32 Unspecified urinary incontinence: Secondary | ICD-10-CM

## 2012-06-12 DIAGNOSIS — R3915 Urgency of urination: Secondary | ICD-10-CM

## 2012-06-12 LAB — POCT URINALYSIS DIPSTICK
Bilirubin, UA: NEGATIVE
Glucose, UA: NEGATIVE
Ketones, UA: NEGATIVE
Nitrite, UA: NEGATIVE
Spec Grav, UA: 1.015
Urobilinogen, UA: NEGATIVE
pH, UA: 7

## 2012-06-12 NOTE — Progress Notes (Signed)
Regular Periods: No bimonthly Mammogram: yes  Monthly Breast Ex.: no Exercise: no  Tetanus < 10 years: yes Seatbelts: yes  NI. Bladder Functn.: no  Abuse at home: no  Daily BM's: yes Stressful Work: yes  Healthy Diet: yes Sigmoid-Colonoscopy: n/a  Calcium: no Medical problems this year: was in hospital   LAST PAP:07/10/2010 Normal  Contraception: None  Mammogram:  06/24/2011  PCP: Dr Sandford Craze   PMH: Was in hospital in 12/2011  [id number]   Last Bone Scan: n/a

## 2012-06-12 NOTE — Progress Notes (Signed)
Subjective:    Susan Whitaker is a 42 y.o. female [phone] who presents for annual exam. The patient has no complaints today. New diagnosis of SVT.  Also complains of urinary urgency recently.  Has been doing Kegels but has not improved.  Only gets up once a night to go to the bathroom, on occasion leaks. but has no dysuria, hematuria or flank pain.    Review of Systems Gastrointestinal:No change in bowel habits, no abdominal pain, no rectal bleeding Genitourinary:negative for abnormal vaginal bleeding,  dysuria, frequency, hematuria, or nocturia.  Does have urgency and on occasion leaking.   Objective:     BP 146/80  Ht 5\' 2"  (1.575 m)  Wt 207 lb (93.895 kg)  BMI 37.86 kg/m2  LMP 06/06/2012 Weight:  Wt Readings from Last 1 Encounters:  06/12/12 207 lb (93.895 kg)   Body mass index is 37.86 kg/(m^2). General Appearance:  Well nourished in no acute distress HEENT: Grossly normal Neck / Thyroid: Supple, no masses, nodes or enlargement Lungs: Clear to auscultation bilaterally Back: No CVA tenderness Breast Exam: No masses or nodes.No dimpling, nipple retraction or discharge. Cardiovascular: Regular rate and rhythm.  Gastrointestinal: Soft, non-tender, no masses or organomegaly Pelvic Exam: EGBUS-normal, vagina-moderate relaxation, cervix-no tenderness or lesions and is anterior, uterus-appears normal but exam limited by habitus, adnexae-no masses or tenderness Lymphatic Exam: Non-palpable nodes in neck, clavicular, axillary, or inguinal regions Skin: No rash or abnormalities Extremities: no clubbing cyanosis or edema Neurologic: Grossly normal Psychiatric: Alert and oriented x 3   U/A: SG-1.015, ph-7, trace =-leuk/protein, 2+ blood  (menses) Assessment:   Routine GYN Exam Urinary urgency Pelvic Relaxation   Plan:  STD testing  Reviewed management options for urinary urgency/incontinence  PAP sent  Urine for culture, if negative will give a trial of hyoscyamine  RTO 1  year or prn   Braidyn Peace,ELMIRAPA-C

## 2012-06-12 NOTE — Patient Instructions (Signed)
Hysterectomy Information   A hysterectomy is a procedure where your uterus is surgically removed. It will no longer be possible to have menstrual periods or to become pregnant. The tubes and ovaries can be removed (bilateral salpingo-oopherectomy) during this surgery as well.    REASONS FOR A HYSTERECTOMY  · Persistent, abnormal bleeding.  · Lasting (chronic) pelvic pain or infection.  · The lining of the uterus (endometrium) starts growing outside the uterus (endometriosis).  · The endometrium starts growing in the muscle of the uterus (adenomyosis).  · The uterus falls down into the vagina (pelvic organ prolapse).  · Symptomatic uterine fibroids.  · Precancerous cells.  · Cervical cancer or uterine cancer.  TYPES OF HYSTERECTOMIES  · Supracervical hysterectomy. This type removes the top part of the uterus, but not the cervix.  · Total hysterectomy. This type removes the uterus and cervix.  · Radical hysterectomy. This type removes the uterus, cervix, and the fibrous tissue that holds the uterus in place in the pelvis (parametrium).  WAYS A HYSTERECTOMY CAN BE PERFORMED  · Abdominal hysterectomy. A large surgical cut (incision) is made in the abdomen. The uterus is removed through this incision.  · Vaginal hysterectomy. An incision is made in the vagina. The uterus is removed through this incision. There are no abdominal incisions.  · Conventional laparoscopic hysterectomy. A thin, lighted tube with a camera (laparoscope) is inserted into 3 or 4 small incisions in the abdomen. The uterus is cut into small pieces. The small pieces are removed through the incisions, or they are removed through the vagina.  · Laparoscopic assisted vaginal hysterectomy (LAVH). Three or four small incisions are made in the abdomen. Part of the surgery is performed laparoscopically and part vaginally. The uterus is removed through the vagina.  · Robot-assisted laparoscopic hysterectomy. A laparoscope is inserted into 3 or 4 small  incisions in the abdomen. A computer-controlled device is used to give the surgeon a 3D image. This allows for more precise movements of surgical instruments. The uterus is cut into small pieces and removed through the incisions or removed through the vagina.  RISKS OF HYSTERECTOMY    · Bleeding and risk of blood transfusion. Tell your caregiver if you do not want to receive any blood products.  · Blood clots in the legs or lung.  · Infection.  · Injury to surrounding organs.  · Anesthesia problems or side effects.  · Conversion to an abdominal hysterectomy.  WHAT TO EXPECT AFTER A HYSTERECTOMY  · You will be given pain medicine.  · You will need to have someone with you for the first 3 to 5 days after you go home.  · You will need to follow up with your surgeon in 2 to 4 weeks after surgery to evaluate your progress.  · You may have early menopause symptoms like hot flashes, night sweats, and insomnia.  · If you had a hysterectomy for a problem that was not a cancer or a condition that could lead to cancer, then you no longer need Pap tests. However, even if you no longer need a Pap test, a regular exam is a good idea to make sure no other problems are starting.  Document Released: 10/27/2000 Document Revised: 07/26/2011 Document Reviewed: 12/12/2010  [employer]® Patient Information ©2013 [employer].

## 2012-06-13 LAB — RPR

## 2012-06-14 LAB — URINE CULTURE: Colony Count: 50000

## 2012-06-14 LAB — PAP IG, CT-NG, RFX HPV ASCU
Chlamydia Probe Amp: NEGATIVE
GC Probe Amp: NEGATIVE

## 2012-06-16 ENCOUNTER — Ambulatory Visit (INDEPENDENT_AMBULATORY_CARE_PROVIDER_SITE_OTHER): Payer: 59 | Admitting: Family

## 2012-06-16 DIAGNOSIS — Z0000 Encounter for general adult medical examination without abnormal findings: Secondary | ICD-10-CM

## 2012-06-16 DIAGNOSIS — E119 Type 2 diabetes mellitus without complications: Secondary | ICD-10-CM

## 2012-06-16 LAB — CBC WITH DIFFERENTIAL/PLATELET
Basophils Absolute: 0 10*3/uL (ref 0.0–0.1)
Basophils Relative: 0 % (ref 0–1)
Eosinophils Absolute: 0.1 10*3/uL (ref 0.0–0.7)
Eosinophils Relative: 2 % (ref 0–5)
HCT: 40.1 % (ref 36.0–46.0)
Hemoglobin: 13.4 g/dL (ref 12.0–15.0)
Lymphocytes Relative: 38 % (ref 12–46)
Lymphs Abs: 3.5 10*3/uL (ref 0.7–4.0)
MCH: 27.7 pg (ref 26.0–34.0)
MCHC: 33.4 g/dL (ref 30.0–36.0)
MCV: 82.9 fL (ref 78.0–100.0)
Monocytes Absolute: 0.5 10*3/uL (ref 0.1–1.0)
Monocytes Relative: 6 % (ref 3–12)
Neutro Abs: 5 10*3/uL (ref 1.7–7.7)
Neutrophils Relative %: 54 % (ref 43–77)
Platelets: 566 10*3/uL — ABNORMAL HIGH (ref 150–400)
RBC: 4.84 MIL/uL (ref 3.87–5.11)
RDW: 14.2 % (ref 11.5–15.5)
WBC: 9.2 10*3/uL (ref 4.0–10.5)

## 2012-06-16 LAB — HEPATIC FUNCTION PANEL
ALT: 10 U/L (ref 0–35)
AST: 11 U/L (ref 0–37)
Albumin: 4.1 g/dL (ref 3.5–5.2)
Alkaline Phosphatase: 61 U/L (ref 39–117)
Bilirubin, Direct: 0.1 mg/dL (ref 0.0–0.3)
Indirect Bilirubin: 0.2 mg/dL (ref 0.0–0.9)
Total Bilirubin: 0.3 mg/dL (ref 0.3–1.2)
Total Protein: 7.5 g/dL (ref 6.0–8.3)

## 2012-06-16 LAB — BASIC METABOLIC PANEL
BUN: 18 mg/dL (ref 6–23)
CO2: 29 mEq/L (ref 19–32)
Calcium: 9.3 mg/dL (ref 8.4–10.5)
Chloride: 101 mEq/L (ref 96–112)
Creat: 0.87 mg/dL (ref 0.50–1.10)
Glucose, Bld: 116 mg/dL — ABNORMAL HIGH (ref 70–99)
Potassium: 3.9 mEq/L (ref 3.5–5.3)
Sodium: 139 mEq/L (ref 135–145)

## 2012-06-17 LAB — URINALYSIS, ROUTINE W REFLEX MICROSCOPIC
Bilirubin Urine: NEGATIVE
Glucose, UA: NEGATIVE mg/dL
Ketones, ur: NEGATIVE mg/dL
Leukocytes, UA: NEGATIVE
Nitrite: NEGATIVE
Protein, ur: NEGATIVE mg/dL
Specific Gravity, Urine: 1.02 (ref 1.005–1.030)
Urobilinogen, UA: 0.2 mg/dL (ref 0.0–1.0)
pH: 6.5 (ref 5.0–8.0)

## 2012-06-17 LAB — URINALYSIS, MICROSCOPIC ONLY
Bacteria, UA: NONE SEEN
Casts: NONE SEEN
Crystals: NONE SEEN

## 2012-06-17 LAB — HEMOGLOBIN A1C
Hgb A1c MFr Bld: 6.9 % — ABNORMAL HIGH (ref <5.7)
Mean Plasma Glucose: 151 mg/dL — ABNORMAL HIGH (ref <117)

## 2012-06-19 NOTE — Progress Notes (Signed)
  Subjective:    Patient ID: Susan Whitaker, female    DOB: November 11, 1970, 42 y.o.   MRN: [id number]  HPI    Review of Systems     Objective:   Physical Exam        Assessment & Plan:  Pt rescheduled apt, not seen today.

## 2012-07-14 ENCOUNTER — Other Ambulatory Visit: Payer: Self-pay | Admitting: Family

## 2012-07-14 ENCOUNTER — Encounter: Payer: Self-pay | Admitting: Family

## 2012-07-14 ENCOUNTER — Ambulatory Visit (INDEPENDENT_AMBULATORY_CARE_PROVIDER_SITE_OTHER): Payer: 59 | Admitting: Family

## 2012-07-14 VITALS — BP 156/92 | HR 76 | Temp 98.1°F | Resp 18 | Ht 63.5 in | Wt 217.0 lb

## 2012-07-14 DIAGNOSIS — D473 Essential (hemorrhagic) thrombocythemia: Secondary | ICD-10-CM

## 2012-07-14 DIAGNOSIS — F411 Generalized anxiety disorder: Secondary | ICD-10-CM

## 2012-07-14 DIAGNOSIS — D759 Disease of blood and blood-forming organs, unspecified: Secondary | ICD-10-CM

## 2012-07-14 DIAGNOSIS — R7989 Other specified abnormal findings of blood chemistry: Secondary | ICD-10-CM

## 2012-07-14 DIAGNOSIS — Z23 Encounter for immunization: Secondary | ICD-10-CM

## 2012-07-14 DIAGNOSIS — I10 Essential (primary) hypertension: Secondary | ICD-10-CM

## 2012-07-14 DIAGNOSIS — Z0000 Encounter for general adult medical examination without abnormal findings: Secondary | ICD-10-CM

## 2012-07-14 NOTE — Assessment & Plan Note (Signed)
BP Readings from Last 3 Encounters:  07/14/12 156/92  06/12/12 146/80  06/06/12 160/80   Pt instructed to restart losartan.

## 2012-07-14 NOTE — Assessment & Plan Note (Signed)
Will refer to therapist.

## 2012-07-14 NOTE — Progress Notes (Signed)
Subjective:    Patient ID: Susan Whitaker, female    DOB: 02/06/1971, 42 y.o.   MRN: [id number]  HPI  Patient presents today for complete physical.  Immunizations: due for tetanus Diet: fair diet. Exercise: walking dogs Pap Smear: 06/12/12 Mammogram: due  Anxiety/stress- at work, would like referral to a therapist.    HTN- not taking losartan-only taking amlodipine.  Bp has been elevated.  Review of Systems  Constitutional: Negative for fever and unexpected weight change.  HENT: Negative for hearing loss and congestion.   Eyes: Negative for visual disturbance.  Respiratory: Negative for cough.   Cardiovascular: Negative for leg swelling.  Gastrointestinal: Negative for nausea, vomiting and diarrhea.  Genitourinary: Positive for frequency. Negative for dysuria.  Musculoskeletal: Negative for arthralgias.  Skin: Negative for rash.  Neurological: Negative for headaches.  Hematological: Negative for adenopathy.  Psychiatric/Behavioral:       See HPI   Past Medical History  Diagnosis Date  . GERD (gastroesophageal reflux disease)   . Hypertension   . Gastric ulcer   . Diabetes mellitus     type II  . Urinary incontinence   . Thrombocytopenia   . Hyperlipidemia   . Allergy     allergic rhinitis  . Hypokalemia   . H/O hemorrhoids   . H/O constipation   . FHx: hypertension   . H/O varicella   . Thyroid fullness 08/2005  . Increased BMI 06/2010  . SVT (supraventricular tachycardia)     Noted 11/2011 admission  . Primary hyperaldosteronism 02/21/2012    History   Social History  . Marital Status: Single    Spouse Name: N/A    Number of Children: 2  . Years of Education: N/A   Occupational History  . 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 Smoking status: Never Smoker   . Smokeless tobacco: Never Used  . Alcohol Use: No  . Drug Use: No  . Sexually Active: Yes -- Female partner(s)    Birth Control/ Protection: None   Other Topics Concern  . Not on  file   Social History Narrative   Works at [employer]          Past Surgical History  Procedure Laterality Date  . Abdominal surgery      40 months of age-- unsure of type of surgery  . Hemorrhoid surgery  2005/2006  . Uterine fibroid embolization      Family History  Problem Relation Age of Onset  . Cancer Other     breast, lung  . Hypertension Other   . Stroke Other   . Cancer Mother     breast  . Stroke Mother   . Cancer Maternal Grandmother     breast  . Hypertension Maternal Grandmother   . Vision loss Father   . Heart disease Father     Rhythm disturbance    Allergies  Allergen Reactions  . Lisinopril     REACTION: ACE cough    Current Outpatient Prescriptions on File Prior to Visit  Medication Sig Dispense Refill  . AMBULATORY NON FORMULARY MEDICATION Place 600 mg vaginally 2 (two) times a week. Medication Name: Boric Acid Suppository 600 mg 1 pv twice weekly as directed  30 suppository  11  . amLODipine (NORVASC) 5 MG tablet Take 1 tablet (5 mg total) by mouth daily.  30 tablet  1  . aspirin EC 81 MG EC tablet Take 1 tablet (81 mg total) by mouth daily.      Marland Kitchen  glucose blood (FREESTYLE LITE) test strip Use as instructed  100 each  2  . Lancets (FREESTYLE) lancets Use to check blood sugar once a day as instructed       . losartan (COZAAR) 50 MG tablet Take 1 tablet (50 mg total) by mouth daily.  90 tablet  1  . lovastatin (MEVACOR) 20 MG tablet Take 1 tablet (20 mg total) by mouth every evening.  90 tablet  1  . metFORMIN (GLUCOPHAGE) 500 MG tablet Take 1 tablet (500 mg total) by mouth 2 (two) times daily with a meal.  180 tablet  1  . metoprolol (LOPRESSOR) 50 MG tablet Take 1.5 tablets (75 mg total) by mouth 2 (two) times daily.  270 tablet  1  . Naproxen Sodium 750 MG TB24 Take 750 mg by mouth as needed. with food      . potassium chloride SA (K-DUR,KLOR-CON) 20 MEQ tablet Take 2 tablets (40 mEq total) by mouth 2 (two) times daily.  60 tablet  2  .  spironolactone (ALDACTONE) 25 MG tablet Take 25 mg by mouth 2 (two) times daily.       No current facility-administered medications on file prior to visit.    BP 156/92  Pulse 76  Temp(Src) 98.1 F (36.7 C) (Oral)  Resp 18  Ht 5' 3.5" (1.613 m)  Wt 217 lb (98.431 kg)  BMI 37.83 kg/m2  SpO2 99%       Objective:   Physical Exam Physical Exam  Constitutional: She is oriented to person, place, and time. She appears well-developed and well-nourished. No distress.  HENT:  Head: Normocephalic and atraumatic.  Right Ear: Tympanic membrane and ear canal normal.  Left Ear: Tympanic membrane and ear canal normal.  Mouth/Throat: Oropharynx is clear and moist.  Eyes: Pupils are equal, round, and reactive to light. No scleral icterus.  Neck: Normal range of motion. No thyromegaly present.  Cardiovascular: Normal rate and regular rhythm.   No murmur heard. Pulmonary/Chest: Effort normal and breath sounds normal. No respiratory distress. He has no wheezes. She has no rales. She exhibits no tenderness.  Abdominal: Soft. Bowel sounds are normal. He exhibits no distension and no mass. There is no tenderness. There is no rebound and no guarding.  Musculoskeletal: She exhibits no edema.  Lymphadenopathy:    She has no cervical adenopathy.  Neurological: She is alert and oriented to person, place, and time.  She exhibits normal muscle tone. Coordination normal.  Skin: Skin is warm and dry.  Psychiatric: She has a normal mood and affect. Her behavior is normal. Judgment and thought content normal.  Breasts: Examined lying Right: Without masses, retractions, discharge or axillary adenopathy.  Left: Without masses, retractions, discharge or axillary adenopathy.           Assessment & Plan:           Assessment & Plan:

## 2012-07-14 NOTE — Assessment & Plan Note (Signed)
Has been chronic, but platelets seem to be trending upward. Refer to hematology for further evaluation.

## 2012-07-14 NOTE — Assessment & Plan Note (Signed)
>>  ASSESSMENT AND PLAN FOR THROMBOCYTOSIS WRITTEN ON 07/14/2012  3:42 PM BY O'SULLIVAN, MELISSA, NP  Has been chronic, but platelets seem to be trending upward. Refer to hematology for further evaluation.

## 2012-07-14 NOTE — Assessment & Plan Note (Signed)
Tdap today. We discussed healthy diet, exercise.  Refer for mammogram. Add caltrate for bone health. Discussed sbe.

## 2012-07-14 NOTE — Addendum Note (Signed)
Addended by: Mervin Kung A on: 07/14/2012 03:53 PM   Modules accepted: Orders

## 2012-07-14 NOTE — Patient Instructions (Addendum)
Please resume losartan. Follow up in 1 month. You will be contacted about your referral to psychiatry. Please let us know if you have not heard back within 1 week about your referral.

## 2012-07-27 ENCOUNTER — Telehealth: Payer: Self-pay | Admitting: Hematology & Oncology

## 2012-07-27 NOTE — Telephone Encounter (Signed)
[name] at [facility] aware fax sent on patient for referral.

## 2012-07-31 ENCOUNTER — Telehealth: Payer: Self-pay | Admitting: Hematology & Oncology

## 2012-07-31 ENCOUNTER — Telehealth: Payer: Self-pay | Admitting: Internal Medicine

## 2012-07-31 NOTE — Telephone Encounter (Signed)
LVM FOR PT TO RETURN CALL ON NP APPT.

## 2012-07-31 NOTE — Telephone Encounter (Signed)
S/W PT IN REF TO NP APPT. 08/21/12@9 :30 REFERRING DR O'SULLIVAN DX ELEVATED PLATELET COUNT MAILED NP PACKET

## 2012-08-01 ENCOUNTER — Telehealth: Payer: Self-pay | Admitting: Internal Medicine

## 2012-08-01 NOTE — Telephone Encounter (Signed)
C/D 08/01/12 for appt. 08/21/12

## 2012-08-07 ENCOUNTER — Ambulatory Visit (INDEPENDENT_AMBULATORY_CARE_PROVIDER_SITE_OTHER): Payer: 59 | Admitting: Family

## 2012-08-07 ENCOUNTER — Encounter: Payer: Self-pay | Admitting: Family

## 2012-08-07 VITALS — BP 144/80 | HR 80 | Temp 97.8°F | Resp 16 | Ht 63.5 in | Wt 214.1 lb

## 2012-08-07 DIAGNOSIS — I10 Essential (primary) hypertension: Secondary | ICD-10-CM

## 2012-08-07 DIAGNOSIS — D759 Disease of blood and blood-forming organs, unspecified: Secondary | ICD-10-CM

## 2012-08-07 DIAGNOSIS — F411 Generalized anxiety disorder: Secondary | ICD-10-CM

## 2012-08-07 NOTE — Assessment & Plan Note (Signed)
Unchanged. Encouraged her to continue regular exercise for stress management and to keep upcoming visit with therapist.

## 2012-08-07 NOTE — Assessment & Plan Note (Signed)
Keep upcoming appointment with hematology.

## 2012-08-07 NOTE — Patient Instructions (Addendum)
Please follow up in Early May 2014.

## 2012-08-07 NOTE — Assessment & Plan Note (Signed)
>>  ASSESSMENT AND PLAN FOR THROMBOCYTOSIS WRITTEN ON 08/07/2012  4:00 PM BY O'SULLIVAN, MELISSA, NP  Keep upcoming appointment with hematology.

## 2012-08-07 NOTE — Progress Notes (Signed)
Subjective:    Patient ID: Susan Whitaker, female    DOB: 03-26-1971, 42 y.o.   MRN: [id number]  HPI  Susan Whitaker is a 42 yr old female who presents today for follow up.  1) anxiety- last visit a referral to a therapist was made. She will be establishing on 4/3.    2) HTN- last visit BP was up and she was instructed to resume losartan.  3) Thrombocytosis- she is scheduled to meet with hematology.  Review of Systems    see HPI  Past Medical History  Diagnosis Date  . GERD (gastroesophageal reflux disease)   . Hypertension   . Gastric ulcer   . Diabetes mellitus     type II  . Urinary incontinence   . Thrombocytopenia   . Hyperlipidemia   . Allergy     allergic rhinitis  . Hypokalemia   . H/O hemorrhoids   . H/O constipation   . FHx: hypertension   . H/O varicella   . Thyroid fullness 08/2005  . Increased BMI 06/2010  . SVT (supraventricular tachycardia)     Noted 11/2011 admission  . Primary hyperaldosteronism 02/21/2012    History   Social History  . Marital Status: Single    Spouse Name: N/A    Number of Children: 2  . Years of Education: N/A   Occupational History  . 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 Smoking status: Never Smoker   . Smokeless tobacco: Never Used  . Alcohol Use: No  . Drug Use: No  . Sexually Active: Yes -- Female partner(s)    Birth Control/ Protection: None   Other Topics Concern  . Not on file   Social History Narrative   Works at [employer]          Past Surgical History  Procedure Laterality Date  . Abdominal surgery      56 months of age-- unsure of type of surgery  . Hemorrhoid surgery  2005/2006  . Uterine fibroid embolization      Family History  Problem Relation Age of Onset  . Cancer Other     breast, lung  . Hypertension Other   . Stroke Other   . Cancer Mother     breast  . Stroke Mother   . Cancer Maternal Grandmother     breast  . Hypertension Maternal Grandmother   . Vision loss  Father   . Heart disease Father     Rhythm disturbance    Allergies  Allergen Reactions  . Lisinopril     REACTION: ACE cough    Current Outpatient Prescriptions on File Prior to Visit  Medication Sig Dispense Refill  . AMBULATORY NON FORMULARY MEDICATION Place 600 mg vaginally 2 (two) times a week. Medication Name: Boric Acid Suppository 600 mg 1 pv twice weekly as directed  30 suppository  11  . amLODipine (NORVASC) 5 MG tablet Take 1 tablet (5 mg total) by mouth daily.  30 tablet  1  . aspirin EC 81 MG EC tablet Take 1 tablet (81 mg total) by mouth daily.      Marland Kitchen glucose blood (FREESTYLE LITE) test strip Use as instructed  100 each  2  . Lancets (FREESTYLE) lancets Use to check blood sugar once a day as instructed       . losartan (COZAAR) 50 MG tablet Take 1 tablet (50 mg total) by mouth daily.  90 tablet  1  . lovastatin (MEVACOR) 20  MG tablet Take 1 tablet (20 mg total) by mouth every evening.  90 tablet  1  . metFORMIN (GLUCOPHAGE) 500 MG tablet Take 1 tablet (500 mg total) by mouth 2 (two) times daily with a meal.  180 tablet  1  . metoprolol (LOPRESSOR) 50 MG tablet Take 1.5 tablets (75 mg total) by mouth 2 (two) times daily.  270 tablet  1  . Naproxen Sodium 750 MG TB24 Take 750 mg by mouth as needed. with food      . potassium chloride SA (K-DUR,KLOR-CON) 20 MEQ tablet Take 2 tablets (40 mEq total) by mouth 2 (two) times daily.  60 tablet  2  . spironolactone (ALDACTONE) 25 MG tablet Take 25 mg by mouth 2 (two) times daily.      . Calcium Carbonate-Vitamin D (CALTRATE 600+D) 600-400 MG-UNIT per tablet Take 1 tablet by mouth 2 (two) times daily.       No current facility-administered medications on file prior to visit.    BP 144/80  Pulse 80  Temp(Src) 97.8 F (36.6 C) (Oral)  Resp 16  Ht 5' 3.5" (1.613 m)  Wt 214 lb 1.3 oz (97.106 kg)  BMI 37.32 kg/m2  SpO2 97%    Objective:   Physical Exam  Constitutional: She is oriented to person, place, and time. She appears  well-developed and well-nourished. No distress.  HENT:  Head: Normocephalic and atraumatic.  Cardiovascular: Normal rate and regular rhythm.   No murmur heard. Pulmonary/Chest: Effort normal and breath sounds normal. No respiratory distress. She has no wheezes. She has no rales. She exhibits no tenderness.  Neurological: She is alert and oriented to person, place, and time.  Psychiatric: She has a normal mood and affect. Her behavior is normal. Judgment and thought content normal.          Assessment & Plan:

## 2012-08-07 NOTE — Assessment & Plan Note (Signed)
Improved.  Continue current meds

## 2012-08-17 ENCOUNTER — Ambulatory Visit (INDEPENDENT_AMBULATORY_CARE_PROVIDER_SITE_OTHER): Payer: 59 | Admitting: Licensed Clinical Social Worker

## 2012-08-17 DIAGNOSIS — F39 Unspecified mood [affective] disorder: Secondary | ICD-10-CM

## 2012-08-19 ENCOUNTER — Ambulatory Visit (INDEPENDENT_AMBULATORY_CARE_PROVIDER_SITE_OTHER): Payer: 59 | Admitting: Physician Assistant

## 2012-08-19 VITALS — BP 132/94 | HR 75 | Temp 99.5°F | Resp 16 | Ht 63.0 in | Wt 214.4 lb

## 2012-08-19 DIAGNOSIS — R350 Frequency of micturition: Secondary | ICD-10-CM

## 2012-08-19 DIAGNOSIS — N390 Urinary tract infection, site not specified: Secondary | ICD-10-CM

## 2012-08-19 LAB — POCT URINALYSIS DIPSTICK
Bilirubin, UA: NEGATIVE
Glucose, UA: NEGATIVE
Ketones, UA: NEGATIVE
Nitrite, UA: NEGATIVE
Protein, UA: 30
Spec Grav, UA: 1.025
Urobilinogen, UA: 0.2
pH, UA: 6

## 2012-08-19 LAB — POCT UA - MICROSCOPIC ONLY
Casts, Ur, LPF, POC: NEGATIVE
Crystals, Ur, HPF, POC: NEGATIVE
Mucus, UA: NEGATIVE
Yeast, UA: NEGATIVE

## 2012-08-19 MED ORDER — PHENAZOPYRIDINE HCL 200 MG PO TABS: 200.0000 mg | ORAL_TABLET | Freq: Three times a day (TID) | ORAL | Status: DC | PRN

## 2012-08-19 MED ORDER — NITROFURANTOIN MONOHYD MACRO 100 MG PO CAPS: 100.0000 mg | ORAL_CAPSULE | Freq: Two times a day (BID) | ORAL | Status: DC

## 2012-08-19 NOTE — Patient Instructions (Addendum)
Begin taking the antibiotic as directed. Be sure to finish the full course.  Use the pyridium if needed for symptoms today and tomorrow.  Plenty of fluids (water is best!)  If any symptoms are worsening or not improving, please let us know   Urinary Tract Infection Urinary tract infections (UTIs) can develop anywhere along your urinary tract. Your urinary tract is your body's drainage system for removing wastes and extra water. Your urinary tract includes two kidneys, two ureters, a bladder, and a urethra. Your kidneys are a pair of bean-shaped organs. Each kidney is about the size of your fist. They are located below your ribs, one on each side of your spine. CAUSES Infections are caused by microbes, which are microscopic organisms, including fungi, viruses, and bacteria. These organisms are so small that they can only be seen through a microscope. Bacteria are the microbes that most commonly cause UTIs. SYMPTOMS  Symptoms of UTIs may vary by age and gender of the patient and by the location of the infection. Symptoms in young women typically include a frequent and intense urge to urinate and a painful, burning feeling in the bladder or urethra during urination. Older women and men are more likely to be tired, shaky, and weak and have muscle aches and abdominal pain. A fever may mean the infection is in your kidneys. Other symptoms of a kidney infection include pain in your back or sides below the ribs, nausea, and vomiting. DIAGNOSIS To diagnose a UTI, your caregiver will ask you about your symptoms. Your caregiver also will ask to provide a urine sample. The urine sample will be tested for bacteria and white blood cells. White blood cells are made by your body to help fight infection. TREATMENT  Typically, UTIs can be treated with medication. Because most UTIs are caused by a bacterial infection, they usually can be treated with the use of antibiotics. The choice of antibiotic and length of treatment  depend on your symptoms and the type of bacteria causing your infection. HOME CARE INSTRUCTIONS  If you were prescribed antibiotics, take them exactly as your caregiver instructs you. Finish the medication even if you feel better after you have only taken some of the medication.  Drink enough water and fluids to keep your urine clear or pale yellow.  Avoid caffeine, tea, and carbonated beverages. They tend to irritate your bladder.  Empty your bladder often. Avoid holding urine for long periods of time.  Empty your bladder before and after sexual intercourse.  After a bowel movement, women should cleanse from front to back. Use each tissue only once. SEEK MEDICAL CARE IF:   You have back pain.  You develop a fever.  Your symptoms do not begin to resolve within 3 days. SEEK IMMEDIATE MEDICAL CARE IF:   You have severe back pain or lower abdominal pain.  You develop chills.  You have nausea or vomiting.  You have continued burning or discomfort with urination. MAKE SURE YOU:   Understand these instructions.  Will watch your condition.  Will get help right away if you are not doing well or get worse. Document Released: 02/10/2005 Document Revised: 11/02/2011 Document Reviewed: 06/11/2011 [facility] Patient Information 2013 [place], [place].

## 2012-08-19 NOTE — Progress Notes (Signed)
Subjective:    Patient ID: Susan Whitaker, female    DOB: 08/21/1970, 42 y.o.   MRN: [id number]  HPI   Susan Whitaker is a very pleasant 42 yr old female here with concern for UTI.  Has noted frequency of urination and dysuria since for two days now.  Tried to make it until Monday so she could see her PCP but was too uncomfortable.  Has had UTIs in the past, last about 6 months ago.  Denies hematuria, abd pain, nausea, vomiting, fever, chills.  Denies vaginal discharge or concern for STI.     Review of Systems  Constitutional: Negative for fever and chills.  HENT: Negative.   Respiratory: Negative.   Cardiovascular: Negative.   Gastrointestinal: Negative for nausea, vomiting and abdominal pain.  Genitourinary: Positive for dysuria and frequency. Negative for hematuria and vaginal discharge.  Musculoskeletal: Negative.   Skin: Negative.   Neurological: Negative.        Objective:   Physical Exam  Vitals reviewed. Constitutional: She is oriented to person, place, and time. She appears well-developed and well-nourished. No distress.  HENT:  Head: Normocephalic and atraumatic.  Eyes: Conjunctivae are normal. No scleral icterus.  Cardiovascular: Normal rate, regular rhythm and normal heart sounds.  Exam reveals no gallop and no friction rub.   No murmur heard. Pulmonary/Chest: Effort normal and breath sounds normal. She has no wheezes. She has no rales.  Abdominal: Soft. Bowel sounds are normal. There is tenderness in the epigastric area. There is no rigidity, no rebound, no guarding and no CVA tenderness.  Neurological: She is alert and oriented to person, place, and time.  Skin: Skin is warm and dry.  Psychiatric: She has a normal mood and affect. Her behavior is normal.     Filed Vitals:   08/19/12 1815  BP: 132/94  Pulse: 75  Temp: 99.5 F (37.5 C)  Resp: 16     Results for orders placed in visit on 08/19/12  POCT UA - MICROSCOPIC ONLY      Result Value Range   WBC, Ur,  HPF, POC tntc     RBC, urine, microscopic tntc     Bacteria, U Microscopic 3+     Mucus, UA neg     Epithelial cells, urine per micros 1-2     Crystals, Ur, HPF, POC neg     Casts, Ur, LPF, POC neg     Yeast, UA neg    POCT URINALYSIS DIPSTICK      Result Value Range   Color, UA yellow     Clarity, UA cloudy     Glucose, UA neg     Bilirubin, UA neg     Ketones, UA neg     Spec Grav, UA 1.025     Blood, UA mod     pH, UA 6.0     Protein, UA 30     Urobilinogen, UA 0.2     Nitrite, UA neg     Leukocytes, UA moderate (2+)          Assessment & Plan:  Urinary tract infection, site not specified - Plan: Urine culture, nitrofurantoin, macrocrystal-monohydrate, (MACROBID) 100 MG capsule, phenazopyridine (PYRIDIUM) 200 MG tablet  Frequency of urination - Plan: POCT UA - Microscopic Only, POCT urinalysis dipstick, phenazopyridine (PYRIDIUM) 200 MG tablet   Susan Whitaker is a very pleasant 42 yr old female with UTI.  UA shows leukocytes and blood.  Will treat with Macrobid x 5 days.  Urine culture  sent, will adjust if necessary based on culture data.  Pyridium for symptoms.  Push fluids.  RTC if worsening or not improving.

## 2012-08-21 ENCOUNTER — Ambulatory Visit (HOSPITAL_BASED_OUTPATIENT_CLINIC_OR_DEPARTMENT_OTHER): Payer: 59 | Admitting: Internal Medicine

## 2012-08-21 ENCOUNTER — Encounter: Payer: Self-pay | Admitting: Internal Medicine

## 2012-08-21 ENCOUNTER — Ambulatory Visit: Payer: 59

## 2012-08-21 ENCOUNTER — Other Ambulatory Visit (HOSPITAL_BASED_OUTPATIENT_CLINIC_OR_DEPARTMENT_OTHER): Payer: 59 | Admitting: Lab

## 2012-08-21 ENCOUNTER — Telehealth: Payer: Self-pay | Admitting: Internal Medicine

## 2012-08-21 VITALS — BP 168/88 | HR 76 | Temp 99.1°F | Resp 20 | Ht 63.0 in | Wt 217.6 lb

## 2012-08-21 DIAGNOSIS — D759 Disease of blood and blood-forming organs, unspecified: Secondary | ICD-10-CM

## 2012-08-21 DIAGNOSIS — D473 Essential (hemorrhagic) thrombocythemia: Secondary | ICD-10-CM

## 2012-08-21 LAB — CBC WITH DIFFERENTIAL/PLATELET
BASO%: 0.6 % (ref 0.0–2.0)
Basophils Absolute: 0.1 10*3/uL (ref 0.0–0.1)
EOS%: 2.2 % (ref 0.0–7.0)
Eosinophils Absolute: 0.2 10*3/uL (ref 0.0–0.5)
HCT: 38.1 % (ref 34.8–46.6)
HGB: 12.2 g/dL (ref 11.6–15.9)
LYMPH%: 38.8 % (ref 14.0–49.7)
MCH: 27 pg (ref 25.1–34.0)
MCHC: 32.2 g/dL (ref 31.5–36.0)
MCV: 83.9 fL (ref 79.5–101.0)
MONO#: 0.5 10*3/uL (ref 0.1–0.9)
MONO%: 6 % (ref 0.0–14.0)
NEUT#: 4.7 10*3/uL (ref 1.5–6.5)
NEUT%: 52.4 % (ref 38.4–76.8)
Platelets: 487 10*3/uL — ABNORMAL HIGH (ref 145–400)
RBC: 4.54 10*6/uL (ref 3.70–5.45)
RDW: 14.2 % (ref 11.2–14.5)
WBC: 9 10*3/uL (ref 3.9–10.3)
lymph#: 3.5 10*3/uL — ABNORMAL HIGH (ref 0.9–3.3)

## 2012-08-21 LAB — IRON AND TIBC
%SAT: 16 % — ABNORMAL LOW (ref 20–55)
Iron: 54 ug/dL (ref 42–145)
TIBC: 347 ug/dL (ref 250–470)
UIBC: 293 ug/dL (ref 125–400)

## 2012-08-21 LAB — COMPREHENSIVE METABOLIC PANEL (CC13)
ALT: 8 U/L (ref 0–55)
AST: 10 U/L (ref 5–34)
Albumin: 3.3 g/dL — ABNORMAL LOW (ref 3.5–5.0)
Alkaline Phosphatase: 60 U/L (ref 40–150)
BUN: 16.7 mg/dL (ref 7.0–26.0)
CO2: 28 mEq/L (ref 22–29)
Calcium: 8.9 mg/dL (ref 8.4–10.4)
Chloride: 102 mEq/L (ref 98–107)
Creatinine: 1 mg/dL (ref 0.6–1.1)
Glucose: 132 mg/dl — ABNORMAL HIGH (ref 70–99)
Potassium: 3.5 mEq/L (ref 3.5–5.1)
Sodium: 138 mEq/L (ref 136–145)
Total Bilirubin: 0.22 mg/dL (ref 0.20–1.20)
Total Protein: 7.6 g/dL (ref 6.4–8.3)

## 2012-08-21 LAB — FERRITIN: Ferritin: 54 ng/mL (ref 10–291)

## 2012-08-21 LAB — LACTATE DEHYDROGENASE (CC13): LDH: 157 U/L (ref 125–245)

## 2012-08-21 NOTE — Patient Instructions (Signed)
You are here today for evaluation of elevated platelets count. I ordered several studies including iron study and ferritin. Continue on observation for now with repeat CBC in one month

## 2012-08-21 NOTE — Progress Notes (Signed)
Quick Note:  Call patient with the result and start Integra Plus one cap po qd X 30 days ______

## 2012-08-21 NOTE — Progress Notes (Signed)
Checked in new patient. No financial issues. °

## 2012-08-21 NOTE — Telephone Encounter (Signed)
Gave pt appt for lab and MD on May 2014 °

## 2012-08-21 NOTE — Progress Notes (Signed)
[facility] Telephone:([phone]) [phone]   Fax:([phone]  CONSULT NOTE  REFERRING PHYSICIAN: Sandford Craze, NP  REASON FOR CONSULTATION:  42 years old [place] American female with elevated platelets count.  HPI Susan Whitaker is a 42 y.o. female was past medical history significant for hypertension, dyslipidemia, diabetes mellitus, SVT and hypokalemia. The patient was seen by Ms. Peggyann Juba recently to establish care with her and CBC on 06/16/2012 showed elevated platelets count of 566,000. She was referred to me today for evaluation and recommendation regarding her elevated platelets count. Reviewing her previous records, the patient had elevated platelets count since December of 2009. On 04/22/2008 her platelets count to over 496,000, on 08/30/2011 her platelets count was 578,000 and on 12/14/2011 her platelets count was 496,000. The patient is feeling fine with no specific complaints today. She has no history of anemia. She denied having any current use of steroids or hormonal treatment. She denied having any hematologic disorders. She has no tests or night sweats. She has no chest pain, shortness breath, cough or hemoptysis. The patient denied having any history of headache or blurry vision. Her family history significant for a mother who was diagnosed with breast cancer one died 4 years ago. Father has irregular heart rate and her daughter with anemia. The patient is single and has 2 children. She currently works in an [employer]. She denied having any history of smoking, alcohol or drug abuse. @SFHPI @  Past Medical History  Diagnosis Date  . GERD (gastroesophageal reflux disease)   . Hypertension   . Gastric ulcer   . Diabetes mellitus     type II  . Urinary incontinence   . Thrombocytopenia   . Hyperlipidemia   . Allergy     allergic rhinitis  . Hypokalemia   . H/O hemorrhoids   . H/O constipation   . FHx: hypertension   . H/O varicella   .  Thyroid fullness 08/2005  . Increased BMI 06/2010  . SVT (supraventricular tachycardia)     Noted 11/2011 admission  . Primary hyperaldosteronism 02/21/2012    Past Surgical History  Procedure Laterality Date  . Abdominal surgery      21 months of age-- unsure of type of surgery  . Hemorrhoid surgery  2005/2006  . Uterine fibroid embolization      Family History  Problem Relation Age of Onset  . Cancer Other     breast, lung  . Hypertension Other   . Stroke Other   . Cancer Mother     breast  . Stroke Mother   . Cancer Maternal Grandmother     breast  . Hypertension Maternal Grandmother   . Vision loss Father   . Heart disease Father     Rhythm disturbance    Social History History  Substance Use Topics  . Smoking status: Never Smoker   . Smokeless tobacco: Never Used  . Alcohol Use: No    Allergies  Allergen Reactions  . Lisinopril     REACTION: ACE cough    Current Outpatient Prescriptions  Medication Sig Dispense Refill  . AMBULATORY NON FORMULARY MEDICATION Place 600 mg vaginally 2 (two) times a week. Medication Name: Boric Acid Suppository 600 mg 1 pv twice weekly as directed  30 suppository  11  . amLODipine (NORVASC) 5 MG tablet Take 1 tablet (5 mg total) by mouth daily.  30 tablet  1  . aspirin EC 81 MG EC tablet Take 1 tablet (81 mg total)  by mouth daily.      . Calcium Carbonate-Vitamin D (CALTRATE 600+D) 600-400 MG-UNIT per tablet Take 1 tablet by mouth 2 (two) times daily.      Marland Kitchen glucose blood (FREESTYLE LITE) test strip Use as instructed  100 each  2  . Lancets (FREESTYLE) lancets Use to check blood sugar once a day as instructed       . losartan (COZAAR) 50 MG tablet Take 1 tablet (50 mg total) by mouth daily.  90 tablet  1  . lovastatin (MEVACOR) 20 MG tablet Take 1 tablet (20 mg total) by mouth every evening.  90 tablet  1  . metFORMIN (GLUCOPHAGE) 500 MG tablet Take 1 tablet (500 mg total) by mouth 2 (two) times daily with a meal.  180 tablet  1  .  metoprolol (LOPRESSOR) 50 MG tablet Take 1.5 tablets (75 mg total) by mouth 2 (two) times daily.  270 tablet  1  . Naproxen Sodium 750 MG TB24 Take 750 mg by mouth as needed. with food      . nitrofurantoin, macrocrystal-monohydrate, (MACROBID) 100 MG capsule Take 1 capsule (100 mg total) by mouth 2 (two) times daily.  10 capsule  0  . phenazopyridine (PYRIDIUM) 200 MG tablet Take 1 tablet (200 mg total) by mouth 3 (three) times daily as needed for pain.  10 tablet  0  . potassium chloride SA (K-DUR,KLOR-CON) 20 MEQ tablet Take 2 tablets (40 mEq total) by mouth 2 (two) times daily.  60 tablet  2  . spironolactone (ALDACTONE) 25 MG tablet Take 25 mg by mouth 2 (two) times daily.       No current facility-administered medications for this visit.    Review of Systems  A comprehensive review of systems was negative.  Physical Exam  ZOX:WRUEA, healthy, no distress, well nourished and well developed SKIN: skin color, texture, turgor are normal HEAD: Normocephalic, No masses, lesions, tenderness or abnormalities EYES: normal, PERRLA EARS: External ears normal OROPHARYNX:no exudate and no erythema  NECK: supple, no adenopathy LYMPH:  no palpable lymphadenopathy BREAST:not examined LUNGS: clear to auscultation  HEART: regular rate & rhythm and no murmurs ABDOMEN:abdomen soft and non-tender BACK: Back symmetric, no curvature., No CVA tenderness EXTREMITIES:no joint deformities, effusion, or inflammation, no edema  NEURO: alert & oriented x 3 with fluent speech, no focal motor/sensory deficits  PERFORMANCE STATUS: ECOG 1  LABORATORY DATA: Lab Results  Component Value Date   WBC 9.0 08/21/2012   HGB 12.2 08/21/2012   HCT 38.1 08/21/2012   MCV 83.9 08/21/2012   PLT 487* 08/21/2012      Chemistry      Component Value Date/Time   NA 139 06/16/2012 1052   K 3.9 06/16/2012 1052   CL 101 06/16/2012 1052   CO2 29 06/16/2012 1052   BUN 18 06/16/2012 1052   CREATININE 0.87 06/16/2012 1052    CREATININE 0.82 12/15/2011 0355      Component Value Date/Time   CALCIUM 9.3 06/16/2012 1052   ALKPHOS 61 06/16/2012 1052   AST 11 06/16/2012 1052   ALT 10 06/16/2012 1052   BILITOT 0.3 06/16/2012 1052       RADIOGRAPHIC STUDIES: No results found.  ASSESSMENT: This is a very pleasant 42 years old African American female with persistent thrombocytosis most likely reactive in nature and this could be related to iron deficiency anemia or other inflammatory process.   PLAN: I have a lengthy discussion with the patient today about her condition. However several studies  to evaluate her thrombocytosis including repeat CBC, comprehensive metabolic panel, LDH as well as iron study and ferritin. I will see the patient back for followup visit in one month with repeat CBC. If the lab results from today showed evidence for iron deficiency, I will start the patient on iron supplement. She was advised to call immediately if she has any concerning symptoms in the interval If no improvement in her platelets count by next visit, I would consider sending molecular studies for JAK-2 mutation.   All questions were answered. The patient knows to call the clinic with any problems, questions or concerns. We can certainly see the patient much sooner if necessary.  Thank you so much for allowing me to participate in the care of Kaleah L Mangel. I will continue to follow up the patient with you and assist in her care.  I spent 25 minutes counseling the patient face to face. The total time spent in the appointment was 50 minutes.  Liann Spaeth K. 08/21/2012, 10:46 AM

## 2012-08-22 ENCOUNTER — Telehealth: Payer: Self-pay | Admitting: *Deleted

## 2012-08-22 DIAGNOSIS — D649 Anemia, unspecified: Secondary | ICD-10-CM

## 2012-08-22 LAB — URINE CULTURE: Colony Count: >=100000

## 2012-08-22 MED ORDER — INTEGRA PLUS PO CAPS: 1.0000 | ORAL_CAPSULE | Freq: Every day | ORAL | Status: DC

## 2012-08-22 NOTE — Telephone Encounter (Signed)
Per Dr Donnald Garre, lab work was reviewed and pt needs to start taking integra plus capsules daily.  Spoke with pt regarding starting integra plus capsules daily.  Pt verbalized understanding.  SLJ

## 2012-09-07 ENCOUNTER — Ambulatory Visit: Payer: 59 | Admitting: Licensed Clinical Social Worker

## 2012-09-20 ENCOUNTER — Ambulatory Visit: Payer: Self-pay | Admitting: Internal Medicine

## 2012-09-21 ENCOUNTER — Other Ambulatory Visit: Payer: Self-pay | Admitting: Family

## 2012-09-21 DIAGNOSIS — Z1231 Encounter for screening mammogram for malignant neoplasm of breast: Secondary | ICD-10-CM

## 2012-09-25 ENCOUNTER — Ambulatory Visit: Payer: Self-pay | Admitting: Internal Medicine

## 2012-09-25 ENCOUNTER — Other Ambulatory Visit: Payer: Self-pay | Admitting: Lab

## 2012-09-26 ENCOUNTER — Other Ambulatory Visit (HOSPITAL_BASED_OUTPATIENT_CLINIC_OR_DEPARTMENT_OTHER): Payer: 59 | Admitting: Lab

## 2012-09-26 ENCOUNTER — Ambulatory Visit (HOSPITAL_BASED_OUTPATIENT_CLINIC_OR_DEPARTMENT_OTHER): Payer: 59 | Admitting: Internal Medicine

## 2012-09-26 ENCOUNTER — Telehealth: Payer: Self-pay | Admitting: Internal Medicine

## 2012-09-26 ENCOUNTER — Encounter: Payer: Self-pay | Admitting: Internal Medicine

## 2012-09-26 VITALS — BP 155/98 | HR 83 | Temp 98.5°F | Resp 18 | Ht 63.0 in | Wt 209.0 lb

## 2012-09-26 DIAGNOSIS — D759 Disease of blood and blood-forming organs, unspecified: Secondary | ICD-10-CM

## 2012-09-26 LAB — CBC WITH DIFFERENTIAL/PLATELET
BASO%: 0.5 % (ref 0.0–2.0)
Basophils Absolute: 0 10*3/uL (ref 0.0–0.1)
EOS%: 1.4 % (ref 0.0–7.0)
Eosinophils Absolute: 0.1 10*3/uL (ref 0.0–0.5)
HCT: 39.7 % (ref 34.8–46.6)
HGB: 13.1 g/dL (ref 11.6–15.9)
LYMPH%: 46.9 % (ref 14.0–49.7)
MCH: 28.1 pg (ref 25.1–34.0)
MCHC: 33.1 g/dL (ref 31.5–36.0)
MCV: 84.9 fL (ref 79.5–101.0)
MONO#: 0.6 10*3/uL (ref 0.1–0.9)
MONO%: 7.1 % (ref 0.0–14.0)
NEUT#: 3.9 10*3/uL (ref 1.5–6.5)
NEUT%: 44.1 % (ref 38.4–76.8)
Platelets: 481 10*3/uL — ABNORMAL HIGH (ref 145–400)
RBC: 4.67 10*6/uL (ref 3.70–5.45)
RDW: 13.9 % (ref 11.2–14.5)
WBC: 8.8 10*3/uL (ref 3.9–10.3)
lymph#: 4.1 10*3/uL — ABNORMAL HIGH (ref 0.9–3.3)

## 2012-09-26 NOTE — Patient Instructions (Signed)
Continue on Integra plus Followup visit in 3 months with repeat blood work.

## 2012-09-26 NOTE — Progress Notes (Signed)
[facility] Telephone:(336) [phone]   Fax:(336) [phone]  OFFICE PROGRESS NOTE  Lemont Fillers., NP [address] [place] [address]  DIAGNOSIS: thrombocytosis most likely reactive in nature  PRIOR THERAPY:  None  CURRENT THERAPY: Integra plus 1 capsule by mouth daily.  INTERVAL HISTORY: Susan Whitaker 42 y.o. female returns to the clinic today for followup visit. The patient is feeling fine today with no specific complaints except for mild fatigue. She felt much better after starting treatment with Integra plus. The patient denied having any significant bleeding issues. She denied having any chest pain, shortness breath, cough or hemoptysis. She had repeat CBC performed earlier today and she is here for evaluation and discussion of her lab results.  MEDICAL HISTORY: Past Medical History  Diagnosis Date  . GERD (gastroesophageal reflux disease)   . Hypertension   . Gastric ulcer   . Diabetes mellitus     type II  . Urinary incontinence   . Thrombocytopenia   . Hyperlipidemia   . Allergy     allergic rhinitis  . Hypokalemia   . H/O hemorrhoids   . H/O constipation   . FHx: hypertension   . H/O varicella   . Thyroid fullness 08/2005  . Increased BMI 06/2010  . SVT (supraventricular tachycardia)     Noted 11/2011 admission  . Primary hyperaldosteronism 02/21/2012    ALLERGIES:  is allergic to lisinopril.  MEDICATIONS:  Current Outpatient Prescriptions  Medication Sig Dispense Refill  . AMBULATORY NON FORMULARY MEDICATION Place 600 mg vaginally 2 (two) times a week. Medication Name: Boric Acid Suppository 600 mg 1 pv twice weekly as directed  30 suppository  11  . amLODipine (NORVASC) 5 MG tablet Take 1 tablet (5 mg total) by mouth daily.  30 tablet  1  . aspirin EC 81 MG EC tablet Take 1 tablet (81 mg total) by mouth daily.      . Calcium Carbonate-Vitamin D (CALTRATE 600+D) 600-400 MG-UNIT per tablet Take 1 tablet by mouth 2 (two) times  daily.      Marland Kitchen FeFum-FePoly-FA-B Cmp-C-Biot (INTEGRA PLUS) CAPS Take 1 capsule by mouth daily.  30 capsule  0  . glucose blood (FREESTYLE LITE) test strip Use as instructed  100 each  2  . Lancets (FREESTYLE) lancets Use to check blood sugar once a day as instructed       . losartan (COZAAR) 50 MG tablet Take 1 tablet (50 mg total) by mouth daily.  90 tablet  1  . lovastatin (MEVACOR) 20 MG tablet Take 1 tablet (20 mg total) by mouth every evening.  90 tablet  1  . metFORMIN (GLUCOPHAGE) 500 MG tablet Take 1 tablet (500 mg total) by mouth 2 (two) times daily with a meal.  180 tablet  1  . metoprolol (LOPRESSOR) 50 MG tablet Take 1.5 tablets (75 mg total) by mouth 2 (two) times daily.  270 tablet  1  . Naproxen Sodium 750 MG TB24 Take 750 mg by mouth as needed. with food      . nitrofurantoin, macrocrystal-monohydrate, (MACROBID) 100 MG capsule Take 1 capsule (100 mg total) by mouth 2 (two) times daily.  10 capsule  0  . phenazopyridine (PYRIDIUM) 200 MG tablet Take 1 tablet (200 mg total) by mouth 3 (three) times daily as needed for pain.  10 tablet  0  . potassium chloride SA (K-DUR,KLOR-CON) 20 MEQ tablet Take 2 tablets (40 mEq total) by mouth 2 (two)  times daily.  60 tablet  2  . spironolactone (ALDACTONE) 25 MG tablet Take 25 mg by mouth 2 (two) times daily.       No current facility-administered medications for this visit.    SURGICAL HISTORY:  Past Surgical History  Procedure Laterality Date  . Abdominal surgery      69 months of age-- unsure of type of surgery  . Hemorrhoid surgery  2005/2006  . Uterine fibroid embolization      REVIEW OF SYSTEMS:  A comprehensive review of systems was negative.   PHYSICAL EXAMINATION: General appearance: alert, cooperative and no distress Head: Normocephalic, without obvious abnormality, atraumatic Neck: no adenopathy Lymph nodes: Cervical, supraclavicular, and axillary nodes normal. Resp: clear to auscultation bilaterally Cardio: regular rate  and rhythm, S1, S2 normal, no murmur, click, rub or gallop GI: soft, non-tender; bowel sounds normal; no masses,  no organomegaly Extremities: extremities normal, atraumatic, no cyanosis or edema  ECOG PERFORMANCE STATUS: 0 - Asymptomatic  Blood pressure 155/98, pulse 83, temperature 98.5 F (36.9 C), temperature source Oral, resp. rate 18, height 5\' 3"  (1.6 m), weight 209 lb (94.802 kg).  LABORATORY DATA: Lab Results  Component Value Date   WBC 8.8 09/26/2012   HGB 13.1 09/26/2012   HCT 39.7 09/26/2012   MCV 84.9 09/26/2012   PLT 481* 09/26/2012      Chemistry      Component Value Date/Time   NA 138 08/21/2012 1002   NA 139 06/16/2012 1052   K 3.5 08/21/2012 1002   K 3.9 06/16/2012 1052   CL 102 08/21/2012 1002   CL 101 06/16/2012 1052   CO2 28 08/21/2012 1002   CO2 29 06/16/2012 1052   BUN 16.7 08/21/2012 1002   BUN 18 06/16/2012 1052   CREATININE 1.0 08/21/2012 1002   CREATININE 0.87 06/16/2012 1052   CREATININE 0.82 12/15/2011 0355      Component Value Date/Time   CALCIUM 8.9 08/21/2012 1002   CALCIUM 9.3 06/16/2012 1052   ALKPHOS 60 08/21/2012 1002   ALKPHOS 61 06/16/2012 1052   AST 10 08/21/2012 1002   AST 11 06/16/2012 1052   ALT 8 08/21/2012 1002   ALT 10 06/16/2012 1052   BILITOT 0.22 08/21/2012 1002   BILITOT 0.3 06/16/2012 1052       RADIOGRAPHIC STUDIES: No results found.  ASSESSMENT: this is a very pleasant 42 years old [place] American female with persistent thrombocytosis most likely reactive in nature secondary to iron deficiency.   PLAN: I recommended for the patient to continue on Integra plus for now. I would see her back for follow up visit in 3 months with repeat CBC and iron study. If the patient continues to have elevated platelets count him I may consider her for molecular study for JAK-2 mutation. She was advised to call immediately if she has any concerning symptoms in the interval.  All questions were answered. The patient knows to call the clinic with any problems,  questions or concerns. We can certainly see the patient much sooner if necessary.

## 2012-09-26 NOTE — Telephone Encounter (Signed)
gv and printed appt sched and avs for pt for Aug °

## 2012-09-29 ENCOUNTER — Inpatient Hospital Stay (HOSPITAL_BASED_OUTPATIENT_CLINIC_OR_DEPARTMENT_OTHER): Admission: RE | Admit: 2012-09-29 | Payer: Self-pay | Source: Ambulatory Visit

## 2012-10-02 ENCOUNTER — Encounter: Payer: Self-pay | Admitting: Family

## 2012-10-02 ENCOUNTER — Ambulatory Visit: Payer: 59 | Admitting: Family

## 2012-10-02 ENCOUNTER — Ambulatory Visit (INDEPENDENT_AMBULATORY_CARE_PROVIDER_SITE_OTHER): Payer: 59 | Admitting: Family

## 2012-10-02 ENCOUNTER — Ambulatory Visit (HOSPITAL_BASED_OUTPATIENT_CLINIC_OR_DEPARTMENT_OTHER)
Admission: RE | Admit: 2012-10-02 | Discharge: 2012-10-02 | Disposition: A | Discharge status: Home / Self Care | Payer: 59 | Source: Ambulatory Visit | Attending: Family | Admitting: Family

## 2012-10-02 VITALS — BP 130/98 | HR 78 | Resp 16 | Ht 63.5 in | Wt 215.0 lb

## 2012-10-02 DIAGNOSIS — R300 Dysuria: Secondary | ICD-10-CM

## 2012-10-02 DIAGNOSIS — I10 Essential (primary) hypertension: Secondary | ICD-10-CM

## 2012-10-02 DIAGNOSIS — E119 Type 2 diabetes mellitus without complications: Secondary | ICD-10-CM

## 2012-10-02 DIAGNOSIS — N390 Urinary tract infection, site not specified: Secondary | ICD-10-CM

## 2012-10-02 DIAGNOSIS — F411 Generalized anxiety disorder: Secondary | ICD-10-CM

## 2012-10-02 DIAGNOSIS — Z1231 Encounter for screening mammogram for malignant neoplasm of breast: Secondary | ICD-10-CM | POA: Insufficient documentation

## 2012-10-02 LAB — POCT URINALYSIS DIPSTICK
Bilirubin, UA: NEGATIVE
Glucose, UA: NEGATIVE
Ketones, UA: NEGATIVE
Nitrite, UA: NEGATIVE
Spec Grav, UA: 1.01
Urobilinogen, UA: 0.2
pH, UA: 6.5

## 2012-10-02 MED ORDER — SPIRONOLACTONE 50 MG PO TABS: 50.0000 mg | ORAL_TABLET | Freq: Every day | ORAL | Status: DC

## 2012-10-02 MED ORDER — LOVASTATIN 20 MG PO TABS: 20.0000 mg | ORAL_TABLET | Freq: Every evening | ORAL | Status: DC

## 2012-10-02 MED ORDER — HYDROCHLOROTHIAZIDE 25 MG PO TABS: 25.0000 mg | ORAL_TABLET | Freq: Every day | ORAL | Status: DC

## 2012-10-02 MED ORDER — CIPROFLOXACIN HCL 500 MG PO TABS: 500.0000 mg | ORAL_TABLET | Freq: Two times a day (BID) | ORAL | Status: DC

## 2012-10-02 NOTE — Progress Notes (Signed)
Subjective:     Patient ID: Susan Whitaker, female   DOB: 10-28-1970, 42 y.o.   MRN: [id number]  HPI  Mr. Derenzo is a 42 yr old female who presents today for follow up.  1) HTN- She is currently maintained on losartan, amlodipine, metoprolol, aldactone.  2) Anxiety- reports that stress is a little bit better. Has an interview for a new job which she hopes will be a less stressful opportunity.  3) Dysuria-denies fever. + dysuria, denies hematuria.   Review of Systems    see HPI  Past Medical History  Diagnosis Date  . GERD (gastroesophageal reflux disease)   . Hypertension   . Gastric ulcer   . Diabetes mellitus     type II  . Urinary incontinence   . Thrombocytopenia   . Hyperlipidemia   . Allergy     allergic rhinitis  . Hypokalemia   . H/O hemorrhoids   . H/O constipation   . FHx: hypertension   . H/O varicella   . Thyroid fullness 08/2005  . Increased BMI 06/2010  . SVT (supraventricular tachycardia)     Noted 11/2011 admission  . Primary hyperaldosteronism 02/21/2012    History   Social History  . Marital Status: Single    Spouse Name: N/A    Number of Children: 2  . Years of Education: N/A   Occupational History  . 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 Smoking status: Never Smoker   . Smokeless tobacco: Never Used  . Alcohol Use: No  . Drug Use: No  . Sexually Active: Yes -- Female partner(s)    Birth Control/ Protection: None   Other Topics Concern  . Not on file   Social History Narrative   Works at [employer]          Past Surgical History  Procedure Laterality Date  . Abdominal surgery      55 months of age-- unsure of type of surgery  . Hemorrhoid surgery  2005/2006  . Uterine fibroid embolization      Family History  Problem Relation Age of Onset  . Cancer Other     breast, lung  . Hypertension Other   . Stroke Other   . Cancer Mother     breast  . Stroke Mother   . Cancer Maternal Grandmother     breast  .  Hypertension Maternal Grandmother   . Vision loss Father   . Heart disease Father     Rhythm disturbance    Allergies  Allergen Reactions  . Lisinopril     REACTION: ACE cough    Current Outpatient Prescriptions on File Prior to Visit  Medication Sig Dispense Refill  . AMBULATORY NON FORMULARY MEDICATION Place 600 mg vaginally 2 (two) times a week. Medication Name: Boric Acid Suppository 600 mg 1 pv twice weekly as directed  30 suppository  11  . amLODipine (NORVASC) 5 MG tablet Take 1 tablet (5 mg total) by mouth daily.  30 tablet  1  . aspirin EC 81 MG EC tablet Take 1 tablet (81 mg total) by mouth daily.      . Calcium Carbonate-Vitamin D (CALTRATE 600+D) 600-400 MG-UNIT per tablet Take 1 tablet by mouth 2 (two) times daily.      Marland Kitchen FeFum-FePoly-FA-B Cmp-C-Biot (INTEGRA PLUS) CAPS Take 1 capsule by mouth daily.  30 capsule  0  . glucose blood (FREESTYLE LITE) test strip Use as instructed  100 each  2  .  Lancets (FREESTYLE) lancets Use to check blood sugar once a day as instructed       . losartan (COZAAR) 50 MG tablet Take 1 tablet (50 mg total) by mouth daily.  90 tablet  1  . lovastatin (MEVACOR) 20 MG tablet Take 1 tablet (20 mg total) by mouth every evening.  90 tablet  1  . metFORMIN (GLUCOPHAGE) 500 MG tablet Take 1 tablet (500 mg total) by mouth 2 (two) times daily with a meal.  180 tablet  1  . metoprolol (LOPRESSOR) 50 MG tablet Take 1.5 tablets (75 mg total) by mouth 2 (two) times daily.  270 tablet  1  . Naproxen Sodium 750 MG TB24 Take 750 mg by mouth as needed. with food      . spironolactone (ALDACTONE) 25 MG tablet Take 25 mg by mouth 2 (two) times daily.      . potassium chloride SA (K-DUR,KLOR-CON) 20 MEQ tablet Take 2 tablets (40 mEq total) by mouth 2 (two) times daily.  60 tablet  2   No current facility-administered medications on file prior to visit.    BP 130/98  Pulse 78  Resp 16  Ht 5' 3.5" (1.613 m)  Wt 215 lb 0.6 oz (97.542 kg)  BMI 37.49 kg/m2  SpO2  99%    Objective:   Physical Exam  Constitutional: She appears well-developed and well-nourished. No distress.  HENT:  Head: Normocephalic and atraumatic.  Cardiovascular: Normal rate and regular rhythm.   No murmur heard. Pulmonary/Chest: Effort normal and breath sounds normal. No respiratory distress. She has no wheezes. She has no rales. She exhibits no tenderness.  Abdominal: Soft. There is no tenderness.  Psychiatric: She has a normal mood and affect. Her behavior is normal. Judgment and thought content normal.       Assessment:         Plan:

## 2012-10-02 NOTE — Patient Instructions (Addendum)
Please follow up in 1 month.  Call if symptoms worsen or if not improved in 2-3 days.

## 2012-10-05 DIAGNOSIS — N390 Urinary tract infection, site not specified: Secondary | ICD-10-CM | POA: Insufficient documentation

## 2012-10-05 LAB — URINE CULTURE: Colony Count: 85000

## 2012-10-05 NOTE — Assessment & Plan Note (Signed)
Will need A1C next visit.

## 2012-10-05 NOTE — Assessment & Plan Note (Signed)
UA + leuks, + blood, send for culture, rx with cipro

## 2012-10-05 NOTE — Assessment & Plan Note (Signed)
BP Readings from Last 3 Encounters:  10/02/12 130/98  09/26/12 155/98  08/21/12 168/88   DBP remains elevated.  She is also concerned re: swelling, though she does not appear edematous today on exam.  Increase aldactone from 25mg  to 50 mg with close monitoring of K+

## 2012-10-05 NOTE — Assessment & Plan Note (Signed)
Stable.  Monitor.  

## 2012-10-06 ENCOUNTER — Other Ambulatory Visit: Payer: Self-pay | Admitting: Internal Medicine

## 2012-10-06 DIAGNOSIS — D649 Anemia, unspecified: Secondary | ICD-10-CM

## 2012-10-16 ENCOUNTER — Ambulatory Visit: Payer: 59 | Admitting: Family

## 2012-10-17 ENCOUNTER — Telehealth: Payer: Self-pay | Admitting: *Deleted

## 2012-10-17 NOTE — Telephone Encounter (Signed)
Received call from Arna Medici at [facility] requesting pt's last 4 BP and pulse readings for Dr Lacy Duverney.  Gave her the following readings:  10/02/12  130/98   78 08/07/12  144/80   80 07/14/12  156/92   76 06/06/12  160/80   69

## 2012-10-27 ENCOUNTER — Ambulatory Visit (HOSPITAL_BASED_OUTPATIENT_CLINIC_OR_DEPARTMENT_OTHER)
Admission: RE | Admit: 2012-10-27 | Discharge: 2012-10-27 | Disposition: A | Discharge status: Home / Self Care | Payer: 59 | Source: Ambulatory Visit | Attending: Family | Admitting: Family

## 2012-10-27 ENCOUNTER — Telehealth: Payer: Self-pay | Admitting: *Deleted

## 2012-10-27 ENCOUNTER — Encounter: Payer: Self-pay | Admitting: Family

## 2012-10-27 ENCOUNTER — Ambulatory Visit (INDEPENDENT_AMBULATORY_CARE_PROVIDER_SITE_OTHER): Payer: 59 | Admitting: Family

## 2012-10-27 VITALS — BP 148/94 | HR 83 | Temp 98.3°F | Resp 16 | Ht 63.5 in | Wt 214.0 lb

## 2012-10-27 DIAGNOSIS — M25551 Pain in right hip: Secondary | ICD-10-CM

## 2012-10-27 DIAGNOSIS — M25559 Pain in unspecified hip: Secondary | ICD-10-CM

## 2012-10-27 DIAGNOSIS — E119 Type 2 diabetes mellitus without complications: Secondary | ICD-10-CM

## 2012-10-27 DIAGNOSIS — IMO0001 Reserved for inherently not codable concepts without codable children: Secondary | ICD-10-CM

## 2012-10-27 DIAGNOSIS — Z0189 Encounter for other specified special examinations: Secondary | ICD-10-CM

## 2012-10-27 DIAGNOSIS — I10 Essential (primary) hypertension: Secondary | ICD-10-CM

## 2012-10-27 LAB — POCT URINE HCG BY VISUAL COLOR COMPARISON TESTS: Preg Test, Ur: NEGATIVE

## 2012-10-27 MED ORDER — SPIRONOLACTONE 50 MG PO TABS: 50.0000 mg | ORAL_TABLET | Freq: Every day | ORAL | Status: DC

## 2012-10-27 MED ORDER — METOPROLOL TARTRATE 100 MG PO TABS: 100.0000 mg | ORAL_TABLET | Freq: Two times a day (BID) | ORAL | Status: DC

## 2012-10-27 MED ORDER — HYDROCHLOROTHIAZIDE 25 MG PO TABS: 25.0000 mg | ORAL_TABLET | Freq: Every day | ORAL | Status: DC

## 2012-10-27 MED ORDER — AMLODIPINE BESYLATE 5 MG PO TABS: 5.0000 mg | ORAL_TABLET | Freq: Every day | ORAL | Status: DC

## 2012-10-27 MED ORDER — METFORMIN HCL 500 MG PO TABS: 500.0000 mg | ORAL_TABLET | Freq: Two times a day (BID) | ORAL | Status: DC

## 2012-10-27 NOTE — Progress Notes (Signed)
Subjective:    Patient ID: Susan Whitaker, female    DOB: 1970/09/22, 42 y.o.   MRN: [id number]  HPI  Susan Whitaker is a 42 yr old female who presents today for follow up.  1) HTN- due to swelling last visit, we increased aldactone from 25mg  to 50 mg. She is continued on metoprolol, HCTZ and losartan. She has been swimming in the evenings.  This has been helping   2) DM2- She is maintained on metformin.    3) Hip pain- She reports that she tripped over her dog and fell, feeling some better, but she continues to have right hip pain. She reports hip pain is 7/10.    Review of Systems See HPI  Past Medical History  Diagnosis Date  . GERD (gastroesophageal reflux disease)   . Hypertension   . Gastric ulcer   . Diabetes mellitus     type II  . Urinary incontinence   . Thrombocytopenia   . Hyperlipidemia   . Allergy     allergic rhinitis  . Hypokalemia   . H/O hemorrhoids   . H/O constipation   . FHx: hypertension   . H/O varicella   . Thyroid fullness 08/2005  . Increased BMI 06/2010  . SVT (supraventricular tachycardia)     Noted 11/2011 admission  . Primary hyperaldosteronism 02/21/2012    History   Social History  . Marital Status: Single    Spouse Name: N/A    Number of Children: 2  . Years of Education: N/A   Occupational History  . 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 Smoking status: Never Smoker   . Smokeless tobacco: Never Used  . Alcohol Use: No  . Drug Use: No  . Sexually Active: Yes -- Female partner(s)    Birth Control/ Protection: None   Other Topics Concern  . Not on file   Social History Narrative   Works at [employer]          Past Surgical History  Procedure Laterality Date  . Abdominal surgery      49 months of age-- unsure of type of surgery  . Hemorrhoid surgery  2005/2006  . Uterine fibroid embolization      Family History  Problem Relation Age of Onset  . Cancer Other     breast, lung  . Hypertension Other    . Stroke Other   . Cancer Mother     breast  . Stroke Mother   . Cancer Maternal Grandmother     breast  . Hypertension Maternal Grandmother   . Vision loss Father   . Heart disease Father     Rhythm disturbance    Allergies  Allergen Reactions  . Lisinopril     REACTION: ACE cough    Current Outpatient Prescriptions on File Prior to Visit  Medication Sig Dispense Refill  . AMBULATORY NON FORMULARY MEDICATION Place 600 mg vaginally 2 (two) times a week. Medication Name: Boric Acid Suppository 600 mg 1 pv twice weekly as directed  30 suppository  11  . aspirin EC 81 MG EC tablet Take 1 tablet (81 mg total) by mouth daily.      . Calcium Carbonate-Vitamin D (CALTRATE 600+D) 600-400 MG-UNIT per tablet Take 1 tablet by mouth 2 (two) times daily.      Marland Kitchen FeFum-FePoly-FA-B Cmp-C-Biot (INTEGRA PLUS) CAPS TAKE ONE CAPSULE BY MOUTH ONCE DAILY  30 each  3  . glucose blood (FREESTYLE LITE) test strip  Use as instructed  100 each  2  . Lancets (FREESTYLE) lancets Use to check blood sugar once a day as instructed       . losartan (COZAAR) 50 MG tablet Take 1 tablet (50 mg total) by mouth daily.  90 tablet  1  . lovastatin (MEVACOR) 20 MG tablet Take 1 tablet (20 mg total) by mouth every evening.  90 tablet  1  . Naproxen Sodium 750 MG TB24 Take 750 mg by mouth as needed. with food      . potassium chloride SA (K-DUR,KLOR-CON) 20 MEQ tablet Take 2 tablets (40 mEq total) by mouth 2 (two) times daily.  60 tablet  2   No current facility-administered medications on file prior to visit.    BP 148/94  Pulse 83  Temp(Src) 98.3 F (36.8 C) (Oral)  Resp 16  Ht 5' 3.5" (1.613 m)  Wt 214 lb 0.6 oz (97.088 kg)  BMI 37.32 kg/m2  SpO2 99%  LMP 10/12/2012       Objective:   Physical Exam  Constitutional: She appears well-developed and well-nourished. No distress.  Cardiovascular: Normal rate and regular rhythm.   No murmur heard. Pulmonary/Chest: Effort normal and breath sounds normal. No  respiratory distress. She has no wheezes. She has no rales. She exhibits no tenderness.  Musculoskeletal: She exhibits no edema.  Full ROM of the right hip, + muscle spasm and pain however after ROM  Neurological: She is alert.  Psychiatric: She has a normal mood and affect. Her behavior is normal. Judgment and thought content normal.          Assessment & Plan:

## 2012-10-27 NOTE — Telephone Encounter (Signed)
Metformin, amlopidine and hctz rxs failed e-scribe transmission. Rxs called to pharmacy voicemail per 10/27/12 office visit.

## 2012-10-27 NOTE — Patient Instructions (Addendum)
Please complete lab work prior to leaving. Complete x ray on first floor. Follow up in 1 month for BP check.

## 2012-10-28 LAB — BASIC METABOLIC PANEL
BUN: 15 mg/dL (ref 6–23)
CO2: 29 mEq/L (ref 19–32)
Calcium: 9.6 mg/dL (ref 8.4–10.5)
Chloride: 100 mEq/L (ref 96–112)
Creat: 0.78 mg/dL (ref 0.50–1.10)
Glucose, Bld: 103 mg/dL — ABNORMAL HIGH (ref 70–99)
Potassium: 3.3 mEq/L — ABNORMAL LOW (ref 3.5–5.3)
Sodium: 139 mEq/L (ref 135–145)

## 2012-10-28 LAB — HEMOGLOBIN A1C
Hgb A1c MFr Bld: 6.5 % — ABNORMAL HIGH (ref <5.7)
Mean Plasma Glucose: 140 mg/dL — ABNORMAL HIGH (ref <117)

## 2012-10-29 ENCOUNTER — Telehealth: Payer: Self-pay | Admitting: Family

## 2012-10-29 DIAGNOSIS — E876 Hypokalemia: Secondary | ICD-10-CM

## 2012-10-29 NOTE — Telephone Encounter (Signed)
Potassium is mildly low.  I would like her to take an additional (2 tabs of K Dur) today.  So rather than taking 2 tabs bid, she can take 2 tabs TID today.  Then return to 2 tabs bid starting tomorrow.  Repeat bmet in 1 week, diag is hypokalemia.

## 2012-10-31 DIAGNOSIS — M25551 Pain in right hip: Secondary | ICD-10-CM | POA: Insufficient documentation

## 2012-10-31 NOTE — Assessment & Plan Note (Signed)
BP Readings from Last 3 Encounters:  10/27/12 148/94  10/02/12 130/98  09/26/12 155/98   BP remains above goal. Will increase metoprolol from 75mg  bid to 100mg  bid.  This is covered on the [facility] 4$ list.  Will have pt continue amlodipin, losartan, hctz.

## 2012-10-31 NOTE — Telephone Encounter (Signed)
Notified pt with Mellisa response. Entered lab work for next week...Raechel Chute

## 2012-10-31 NOTE — Assessment & Plan Note (Signed)
A1C at goal at 6.5.   Continue metformin.
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
## 2012-11-01 ENCOUNTER — Encounter: Payer: Self-pay | Admitting: Family

## 2012-11-02 ENCOUNTER — Telehealth: Payer: Self-pay | Admitting: Family

## 2012-11-08 NOTE — Telephone Encounter (Signed)
Opened in error

## 2012-12-08 ENCOUNTER — Ambulatory Visit (INDEPENDENT_AMBULATORY_CARE_PROVIDER_SITE_OTHER): Payer: 59 | Admitting: Family

## 2012-12-08 ENCOUNTER — Encounter: Payer: Self-pay | Admitting: Family

## 2012-12-08 VITALS — BP 148/90 | HR 79 | Temp 98.0°F | Resp 16 | Ht 63.5 in | Wt 209.0 lb

## 2012-12-08 DIAGNOSIS — I10 Essential (primary) hypertension: Secondary | ICD-10-CM

## 2012-12-08 MED ORDER — POTASSIUM CHLORIDE CRYS ER 20 MEQ PO TBCR: 40.0000 meq | EXTENDED_RELEASE_TABLET | Freq: Two times a day (BID) | ORAL | Status: DC

## 2012-12-08 NOTE — Progress Notes (Signed)
Subjective:    Patient ID: Susan Whitaker, female    DOB: [birth year], 42 y.o.   MRN: [id number]  HPI  Susan Whitaker is a 42 yr old female who presents today for follow up of her HTN.  Last visit her metoprolol was increased from 75 mg to 100 mg BID.  She is continued on amlodipine, HCTZ, losartan, and aldactone.  She reports that she has been out of medication for a few days as she could not afford her meds.  She plans to resume soon.  She recently learned that her job will be out sourced.    Review of Systems See HPI  Past Medical History  Diagnosis Date  . GERD (gastroesophageal reflux disease)   . Hypertension   . Gastric ulcer   . Diabetes mellitus     type II  . Urinary incontinence   . Thrombocytopenia   . Hyperlipidemia   . Allergy     allergic rhinitis  . Hypokalemia   . H/O hemorrhoids   . H/O constipation   . FHx: hypertension   . H/O varicella   . Thyroid fullness 08/2005  . Increased BMI 06/2010  . SVT (supraventricular tachycardia)     Noted 11/2011 admission  . Primary hyperaldosteronism 02/21/2012    History   Social History  . Marital Status: Single    Spouse Name: N/A    Number of Children: 2  . Years of Education: N/A   Occupational History  . 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 Smoking status: Never Smoker   . Smokeless tobacco: Never Used  . Alcohol Use: No  . Drug Use: No  . Sexually Active: Yes -- Female partner(s)    Birth Control/ Protection: None   Other Topics Concern  . Not on file   Social History Narrative   Works at [employer]          Past Surgical History  Procedure Laterality Date  . Abdominal surgery      49 months of age-- unsure of type of surgery  . Hemorrhoid surgery  2005/2006  . Uterine fibroid embolization      Family History  Problem Relation Age of Onset  . Cancer Other     breast, lung  . Hypertension Other   . Stroke Other   . Cancer Mother     breast  . Stroke Mother   . Cancer  Maternal Grandmother     breast  . Hypertension Maternal Grandmother   . Vision loss Father   . Heart disease Father     Rhythm disturbance    Allergies  Allergen Reactions  . Lisinopril     REACTION: ACE cough    Current Outpatient Prescriptions on File Prior to Visit  Medication Sig Dispense Refill  . AMBULATORY NON FORMULARY MEDICATION Place 600 mg vaginally 2 (two) times a week. Medication Name: Boric Acid Suppository 600 mg 1 pv twice weekly as directed  30 suppository  11  . amLODipine (NORVASC) 5 MG tablet Take 1 tablet (5 mg total) by mouth daily.  30 tablet  1  . aspirin EC 81 MG EC tablet Take 1 tablet (81 mg total) by mouth daily.      . Calcium Carbonate-Vitamin D (CALTRATE 600+D) 600-400 MG-UNIT per tablet Take 1 tablet by mouth 2 (two) times daily.      Marland Kitchen FeFum-FePoly-FA-B Cmp-C-Biot (INTEGRA PLUS) CAPS TAKE ONE CAPSULE BY MOUTH ONCE DAILY  30 each  3  .  glucose blood (FREESTYLE LITE) test strip Use as instructed  100 each  2  . hydrochlorothiazide (HYDRODIURIL) 25 MG tablet Take 1 tablet (25 mg total) by mouth daily.  30 tablet  5  . Lancets (FREESTYLE) lancets Use to check blood sugar once a day as instructed       . losartan (COZAAR) 50 MG tablet Take 1 tablet (50 mg total) by mouth daily.  90 tablet  1  . lovastatin (MEVACOR) 20 MG tablet Take 1 tablet (20 mg total) by mouth every evening.  90 tablet  1  . metFORMIN (GLUCOPHAGE) 500 MG tablet Take 1 tablet (500 mg total) by mouth 2 (two) times daily with a meal.  180 tablet  1  . metoprolol (LOPRESSOR) 100 MG tablet Take 1 tablet (100 mg total) by mouth 2 (two) times daily.  60 tablet  1  . Naproxen Sodium 750 MG TB24 Take 750 mg by mouth as needed. with food      . potassium chloride SA (K-DUR,KLOR-CON) 20 MEQ tablet Take 2 tablets (40 mEq total) by mouth 2 (two) times daily.  60 tablet  2  . spironolactone (ALDACTONE) 50 MG tablet Take 1 tablet (50 mg total) by mouth daily.  30 tablet  5   No current  facility-administered medications on file prior to visit.    BP 148/90  Pulse 79  Temp(Src) 98 F (36.7 C) (Oral)  Resp 16  Ht 5' 3.5" (1.613 m)  Wt 209 lb (94.802 kg)  BMI 36.44 kg/m2  SpO2 99%        Objective:   Physical Exam  Constitutional: She is oriented to person, place, and time. She appears well-developed and well-nourished. No distress.  HENT:  Head: Normocephalic and atraumatic.  Cardiovascular: Normal rate and regular rhythm.   No murmur heard. Pulmonary/Chest: Effort normal and breath sounds normal. No respiratory distress. She has no wheezes. She has no rales. She exhibits no tenderness.  Musculoskeletal: She exhibits no edema.  Neurological: She is alert and oriented to person, place, and time.  Skin: Skin is warm and dry.  Psychiatric: She has a normal mood and affect. Her behavior is normal. Judgment and thought content normal.          Assessment & Plan:

## 2012-12-08 NOTE — Patient Instructions (Addendum)
Restart blood pressure medication. Schedule nurse visit for bp check on medications.  Please follow up in 2 months.

## 2012-12-08 NOTE — Assessment & Plan Note (Signed)
Unchanged as pt is not on bp meds currently. She will arrange nurse visit for BP check on meds.

## 2012-12-27 ENCOUNTER — Other Ambulatory Visit: Payer: Self-pay | Admitting: Lab

## 2012-12-27 ENCOUNTER — Ambulatory Visit: Payer: Self-pay | Admitting: Internal Medicine

## 2013-01-05 ENCOUNTER — Emergency Department (HOSPITAL_BASED_OUTPATIENT_CLINIC_OR_DEPARTMENT_OTHER)
Admission: EM | Admit: 2013-01-05 | Discharge: 2013-01-05 | Disposition: A | Discharge status: Home / Self Care | Payer: 59 | Attending: Emergency Medicine | Admitting: Emergency Medicine

## 2013-01-05 ENCOUNTER — Encounter (HOSPITAL_BASED_OUTPATIENT_CLINIC_OR_DEPARTMENT_OTHER): Payer: Self-pay

## 2013-01-05 DIAGNOSIS — T23101A Burn of first degree of right hand, unspecified site, initial encounter: Secondary | ICD-10-CM

## 2013-01-05 DIAGNOSIS — Z8719 Personal history of other diseases of the digestive system: Secondary | ICD-10-CM | POA: Insufficient documentation

## 2013-01-05 DIAGNOSIS — Y93G3 Activity, cooking and baking: Secondary | ICD-10-CM | POA: Insufficient documentation

## 2013-01-05 DIAGNOSIS — E785 Hyperlipidemia, unspecified: Secondary | ICD-10-CM | POA: Insufficient documentation

## 2013-01-05 DIAGNOSIS — X12XXXA Contact with other hot fluids, initial encounter: Secondary | ICD-10-CM | POA: Insufficient documentation

## 2013-01-05 DIAGNOSIS — E119 Type 2 diabetes mellitus without complications: Secondary | ICD-10-CM | POA: Insufficient documentation

## 2013-01-05 DIAGNOSIS — T23179A Burn of first degree of unspecified wrist, initial encounter: Secondary | ICD-10-CM | POA: Insufficient documentation

## 2013-01-05 DIAGNOSIS — Z8679 Personal history of other diseases of the circulatory system: Secondary | ICD-10-CM | POA: Insufficient documentation

## 2013-01-05 DIAGNOSIS — I10 Essential (primary) hypertension: Secondary | ICD-10-CM | POA: Insufficient documentation

## 2013-01-05 DIAGNOSIS — Y9289 Other specified places as the place of occurrence of the external cause: Secondary | ICD-10-CM | POA: Insufficient documentation

## 2013-01-05 DIAGNOSIS — Z87898 Personal history of other specified conditions: Secondary | ICD-10-CM | POA: Insufficient documentation

## 2013-01-05 DIAGNOSIS — Z79899 Other long term (current) drug therapy: Secondary | ICD-10-CM | POA: Insufficient documentation

## 2013-01-05 DIAGNOSIS — Z8619 Personal history of other infectious and parasitic diseases: Secondary | ICD-10-CM | POA: Insufficient documentation

## 2013-01-05 DIAGNOSIS — K219 Gastro-esophageal reflux disease without esophagitis: Secondary | ICD-10-CM | POA: Insufficient documentation

## 2013-01-05 DIAGNOSIS — E876 Hypokalemia: Secondary | ICD-10-CM | POA: Insufficient documentation

## 2013-01-05 DIAGNOSIS — Z862 Personal history of diseases of the blood and blood-forming organs and certain disorders involving the immune mechanism: Secondary | ICD-10-CM | POA: Insufficient documentation

## 2013-01-05 DIAGNOSIS — Z8711 Personal history of peptic ulcer disease: Secondary | ICD-10-CM | POA: Insufficient documentation

## 2013-01-05 MED ORDER — HYDROCODONE-ACETAMINOPHEN 5-325 MG PO TABS: 1.0000 | ORAL_TABLET | Freq: Four times a day (QID) | ORAL | Status: DC | PRN

## 2013-01-05 MED ORDER — SILVER SULFADIAZINE 1 % EX CREA: TOPICAL_CREAM | Freq: Every day | CUTANEOUS | Status: DC

## 2013-01-05 MED ORDER — SILVER SULFADIAZINE 1 % EX CREA
TOPICAL_CREAM | CUTANEOUS | Status: AC
  Filled 2013-01-05: qty 85

## 2013-01-05 MED ORDER — HYDROCODONE-ACETAMINOPHEN 5-325 MG PO TABS
1.0000 | ORAL_TABLET | Freq: Once | ORAL | Status: AC
  Administered 2013-01-05: 1 via ORAL
  Filled 2013-01-05: qty 1

## 2013-01-05 NOTE — ED Notes (Signed)
MD at bedside. 
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
## 2013-01-05 NOTE — ED Notes (Signed)
Pt states she has not taken BP meds x approx 1 week

## 2013-01-17 ENCOUNTER — Encounter: Payer: Self-pay | Admitting: Family

## 2013-01-17 ENCOUNTER — Ambulatory Visit (INDEPENDENT_AMBULATORY_CARE_PROVIDER_SITE_OTHER): Payer: 59 | Admitting: Family

## 2013-01-17 VITALS — BP 154/98 | HR 90 | Temp 99.2°F | Resp 16 | Wt 212.0 lb

## 2013-01-17 DIAGNOSIS — E785 Hyperlipidemia, unspecified: Secondary | ICD-10-CM

## 2013-01-17 DIAGNOSIS — J020 Streptococcal pharyngitis: Secondary | ICD-10-CM

## 2013-01-17 DIAGNOSIS — I10 Essential (primary) hypertension: Secondary | ICD-10-CM

## 2013-01-17 DIAGNOSIS — J040 Acute laryngitis: Secondary | ICD-10-CM

## 2013-01-17 LAB — POCT RAPID STREP A (OFFICE): Rapid Strep A Screen: POSITIVE — AB

## 2013-01-17 MED ORDER — AMLODIPINE BESYLATE 5 MG PO TABS: 5.0000 mg | ORAL_TABLET | Freq: Every day | ORAL | Status: DC

## 2013-01-17 MED ORDER — AMOXICILLIN 500 MG PO CAPS: 500.0000 mg | ORAL_CAPSULE | Freq: Three times a day (TID) | ORAL | Status: DC

## 2013-01-17 MED ORDER — SPIRONOLACTONE 50 MG PO TABS: 50.0000 mg | ORAL_TABLET | Freq: Every day | ORAL | Status: DC

## 2013-01-17 MED ORDER — LOSARTAN POTASSIUM 50 MG PO TABS: 50.0000 mg | ORAL_TABLET | Freq: Every day | ORAL | Status: DC

## 2013-01-17 MED ORDER — METOPROLOL TARTRATE 100 MG PO TABS: 100.0000 mg | ORAL_TABLET | Freq: Two times a day (BID) | ORAL | Status: DC

## 2013-01-17 MED ORDER — METFORMIN HCL 500 MG PO TABS: 500.0000 mg | ORAL_TABLET | Freq: Two times a day (BID) | ORAL | Status: DC

## 2013-01-17 NOTE — Assessment & Plan Note (Addendum)
She is out of her losartan.  Resume. Follow up in 2 weeks for bp check. Obtain bmet today.

## 2013-01-17 NOTE — Assessment & Plan Note (Signed)
Rapid strep is positive.  rx with amoxicillin.

## 2013-01-17 NOTE — Patient Instructions (Addendum)
Strep Throat  Strep throat is an infection of the throat caused by a bacteria named Streptococcus pyogenes. Your caregiver may call the infection streptococcal "tonsillitis" or "pharyngitis" depending on whether there are signs of inflammation in the tonsils or back of the throat. Strep throat is most common in children aged 44 15 years during the cold months of the year, but it can occur in people of any age during any season. This infection is spread from person to person (contagious) through coughing, sneezing, or other close contact.  SYMPTOMS   · Fever or chills.  · Painful, swollen, red tonsils or throat.  · Pain or difficulty when swallowing.  · White or yellow spots on the tonsils or throat.  · Swollen, tender lymph nodes or "glands" of the neck or under the jaw.  · Red rash all over the body (rare).  DIAGNOSIS   Many different infections can cause the same symptoms. A test must be done to confirm the diagnosis so the right treatment can be given. A "rapid strep test" can help your caregiver make the diagnosis in a few minutes. If this test is not available, a light swab of the infected area can be used for a throat culture test. If a throat culture test is done, results are usually available in a day or two.  TREATMENT   Strep throat is treated with antibiotic medicine.  HOME CARE INSTRUCTIONS   · Gargle with 1 tsp of salt in 1 cup of warm water, 3 4 times per day or as needed for comfort.  · Family members who also have a sore throat or fever should be tested for strep throat and treated with antibiotics if they have the strep infection.  · Make sure everyone in your household washes their hands well.  · Do not share food, drinking cups, or personal items that could cause the infection to spread to others.  · You may need to eat a soft food diet until your sore throat gets better.  · Drink enough water and fluids to keep your urine clear or pale yellow. This will help prevent dehydration.  · Get plenty of  rest.  · Stay home from school, daycare, or work until you have been on antibiotics for 24 hours.  · Only take over-the-counter or prescription medicines for pain, discomfort, or fever as directed by your caregiver.  · If antibiotics are prescribed, take them as directed. Finish them even if you start to feel better.  SEEK MEDICAL CARE IF:   · The glands in your neck continue to enlarge.  · You develop a rash, cough, or earache.  · You cough up green, yellow-brown, or bloody sputum.  · You have pain or discomfort not controlled by medicines.  · Your problems seem to be getting worse rather than better.  SEEK IMMEDIATE MEDICAL CARE IF:   · You develop any new symptoms such as vomiting, severe headache, stiff or painful neck, chest pain, shortness of breath, or trouble swallowing.  · You develop severe throat pain, drooling, or changes in your voice.  · You develop swelling of the neck, or the skin on the neck becomes red and tender.  · You have a fever.  · You develop signs of dehydration, such as fatigue, dry mouth, and decreased urination.  · You become increasingly sleepy, or you cannot wake up completely.  Document Released: 04/30/2000 Document Revised: 04/19/2012 Document Reviewed: 07/02/2010  [employer]® Patient Information ©2014 [employer].

## 2013-01-17 NOTE — Progress Notes (Signed)
Subjective:    Patient ID: Susan Whitaker, female    DOB: 09/25/1970, 42 y.o.   MRN: [id number]  HPI  Susan Whitaker is a 42 yr old female who presents today with chief complaint of laryngitis.  Symptoms started yesterday, today she developed sore throat.  No improvement with hot lemon water.   She denies sick contacts.  She denies fever at home.  She denies associated cough or nasal congestion.    Review of Systems See HPI  Past Medical History  Diagnosis Date  . GERD (gastroesophageal reflux disease)   . Hypertension   . Gastric ulcer   . Diabetes mellitus     type II  . Urinary incontinence   . Thrombocytopenia   . Hyperlipidemia   . Allergy     allergic rhinitis  . Hypokalemia   . H/O hemorrhoids   . H/O constipation   . FHx: hypertension   . H/O varicella   . Thyroid fullness 08/2005  . Increased BMI 06/2010  . SVT (supraventricular tachycardia)     Noted 11/2011 admission  . Primary hyperaldosteronism 02/21/2012    History   Social History  . Marital Status: Single    Spouse Name: N/A    Number of Children: 2  . Years of Education: N/A   Occupational History  . 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 Smoking status: Never Smoker   . Smokeless tobacco: Never Used  . Alcohol Use: No  . Drug Use: No  . Sexual Activity: Not on file   Other Topics Concern  . Not on file   Social History Narrative   Works at [employer]          Past Surgical History  Procedure Laterality Date  . Abdominal surgery      14 months of age-- unsure of type of surgery  . Hemorrhoid surgery  2005/2006  . Uterine fibroid embolization      Family History  Problem Relation Age of Onset  . Cancer Other     breast, lung  . Hypertension Other   . Stroke Other   . Cancer Mother     breast  . Stroke Mother   . Cancer Maternal Grandmother     breast  . Hypertension Maternal Grandmother   . Vision loss Father   . Heart disease Father     Rhythm disturbance     Allergies  Allergen Reactions  . Lisinopril     REACTION: ACE cough    Current Outpatient Prescriptions on File Prior to Visit  Medication Sig Dispense Refill  . AMBULATORY NON FORMULARY MEDICATION Place 600 mg vaginally 2 (two) times a week. Medication Name: Boric Acid Suppository 600 mg 1 pv twice weekly as directed  30 suppository  11  . Calcium Carbonate-Vitamin D (CALTRATE 600+D) 600-400 MG-UNIT per tablet Take 1 tablet by mouth 2 (two) times daily.      Marland Kitchen FeFum-FePoly-FA-B Cmp-C-Biot (INTEGRA PLUS) CAPS TAKE ONE CAPSULE BY MOUTH ONCE DAILY  30 each  3  . glucose blood (FREESTYLE LITE) test strip Use as instructed  100 each  2  . hydrochlorothiazide (HYDRODIURIL) 25 MG tablet Take 1 tablet (25 mg total) by mouth daily.  30 tablet  5  . HYDROcodone-acetaminophen (NORCO/VICODIN) 5-325 MG per tablet Take 1 tablet by mouth every 6 (six) hours as needed for pain.  15 tablet  0  . Lancets (FREESTYLE) lancets Use to check blood sugar once a day as  instructed       . lovastatin (MEVACOR) 20 MG tablet Take 1 tablet (20 mg total) by mouth every evening.  90 tablet  1  . Naproxen Sodium 750 MG TB24 Take 750 mg by mouth as needed. with food      . potassium chloride SA (K-DUR,KLOR-CON) 20 MEQ tablet Take 2 tablets (40 mEq total) by mouth 2 (two) times daily.  60 tablet  2   No current facility-administered medications on file prior to visit.    BP 154/98  Pulse 90  Temp(Src) 99.2 F (37.3 C) (Oral)  Resp 16  Wt 212 lb (96.163 kg)  BMI 38.77 kg/m2  SpO2 98%  LMP 01/01/2013       Objective:   Physical Exam  Constitutional: She is oriented to person, place, and time. She appears well-developed and well-nourished. No distress.  HENT:  Head: Normocephalic and atraumatic.  Right Ear: Tympanic membrane and ear canal normal.  Left Ear: Tympanic membrane and ear canal normal.  Mouth/Throat: Posterior oropharyngeal erythema present. No oropharyngeal exudate.  Mild post oropharyngeal  edema  Cardiovascular: Normal rate and regular rhythm.   No murmur heard. Pulmonary/Chest: Effort normal and breath sounds normal. No respiratory distress. She has no wheezes. She has no rales. She exhibits no tenderness.  Neurological: She is alert and oriented to person, place, and time.  Skin: Skin is warm and dry.  Psychiatric: She has a normal mood and affect. Her behavior is normal. Judgment and thought content normal.          Assessment & Plan:

## 2013-01-18 LAB — BASIC METABOLIC PANEL
BUN: 13 mg/dL (ref 6–23)
CO2: 27 mEq/L (ref 19–32)
Calcium: 9.1 mg/dL (ref 8.4–10.5)
Chloride: 102 mEq/L (ref 96–112)
Creat: 0.79 mg/dL (ref 0.50–1.10)
Glucose, Bld: 95 mg/dL (ref 70–99)
Potassium: 3.6 mEq/L (ref 3.5–5.3)
Sodium: 138 mEq/L (ref 135–145)

## 2013-01-18 LAB — HEPATIC FUNCTION PANEL
ALT: 10 U/L (ref 0–35)
AST: 12 U/L (ref 0–37)
Albumin: 4 g/dL (ref 3.5–5.2)
Alkaline Phosphatase: 53 U/L (ref 39–117)
Bilirubin, Direct: <0.1 mg/dL (ref 0.0–0.3)
Total Bilirubin: 0.2 mg/dL — ABNORMAL LOW (ref 0.3–1.2)
Total Protein: 7.5 g/dL (ref 6.0–8.3)

## 2013-01-20 ENCOUNTER — Encounter: Payer: Self-pay | Admitting: Family

## 2013-02-02 ENCOUNTER — Ambulatory Visit: Payer: Self-pay | Admitting: Family Medicine

## 2013-02-05 ENCOUNTER — Encounter: Payer: Self-pay | Admitting: Family

## 2013-02-05 ENCOUNTER — Ambulatory Visit (INDEPENDENT_AMBULATORY_CARE_PROVIDER_SITE_OTHER): Payer: 59 | Admitting: Family

## 2013-02-05 VITALS — BP 156/80 | Temp 97.9°F | Wt 214.0 lb

## 2013-02-05 DIAGNOSIS — R490 Dysphonia: Secondary | ICD-10-CM

## 2013-02-05 LAB — POCT RAPID STREP A (OFFICE): Rapid Strep A Screen: NEGATIVE

## 2013-02-05 NOTE — Patient Instructions (Addendum)
You will be contacted about your referral to Ear nose and throat.  Follow up in 2 months.

## 2013-02-05 NOTE — Progress Notes (Signed)
Subjective:    Patient ID: Susan Whitaker, female    DOB: [birth year], 42 y.o.   MRN: [id number]  HPI  Ms. Karapetyan is a 42 yr old female who presents today with chief complaint of laryngitis.  She was seen earlier this month and treated for strep throat with amoxicillin.  She completed amoxicillin. Throat pain resolved, but she continues to have hoarseness. Denies recent fevers, GERD symptoms or nasal drainage.    Review of Systems See HPI  Past Medical History  Diagnosis Date  . GERD (gastroesophageal reflux disease)   . Hypertension   . Gastric ulcer   . Diabetes mellitus     type II  . Urinary incontinence   . Thrombocytopenia   . Hyperlipidemia   . Allergy     allergic rhinitis  . Hypokalemia   . H/O hemorrhoids   . H/O constipation   . FHx: hypertension   . H/O varicella   . Thyroid fullness 08/2005  . Increased BMI 06/2010  . SVT (supraventricular tachycardia)     Noted 11/2011 admission  . Primary hyperaldosteronism 02/21/2012    History   Social History  . Marital Status: Single    Spouse Name: N/A    Number of Children: 2  . Years of Education: N/A   Occupational History  . 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 Smoking status: Never Smoker   . Smokeless tobacco: Never Used  . Alcohol Use: No  . Drug Use: No  . Sexual Activity: Not on file   Other Topics Concern  . Not on file   Social History Narrative   Works at [employer]          Past Surgical History  Procedure Laterality Date  . Abdominal surgery      104 months of age-- unsure of type of surgery  . Hemorrhoid surgery  2005/2006  . Uterine fibroid embolization      Family History  Problem Relation Age of Onset  . Cancer Other     breast, lung  . Hypertension Other   . Stroke Other   . Cancer Mother     breast  . Stroke Mother   . Cancer Maternal Grandmother     breast  . Hypertension Maternal Grandmother   . Vision loss Father   . Heart disease Father    Rhythm disturbance    Allergies  Allergen Reactions  . Lisinopril     REACTION: ACE cough    Current Outpatient Prescriptions on File Prior to Visit  Medication Sig Dispense Refill  . AMBULATORY NON FORMULARY MEDICATION Place 600 mg vaginally 2 (two) times a week. Medication Name: Boric Acid Suppository 600 mg 1 pv twice weekly as directed  30 suppository  11  . amLODipine (NORVASC) 5 MG tablet Take 1 tablet (5 mg total) by mouth daily.  30 tablet  1  . Calcium Carbonate-Vitamin D (CALTRATE 600+D) 600-400 MG-UNIT per tablet Take 1 tablet by mouth 2 (two) times daily.      Marland Kitchen FeFum-FePoly-FA-B Cmp-C-Biot (INTEGRA PLUS) CAPS TAKE ONE CAPSULE BY MOUTH ONCE DAILY  30 each  3  . glucose blood (FREESTYLE LITE) test strip Use as instructed  100 each  2  . hydrochlorothiazide (HYDRODIURIL) 25 MG tablet Take 1 tablet (25 mg total) by mouth daily.  30 tablet  5  . HYDROcodone-acetaminophen (NORCO/VICODIN) 5-325 MG per tablet Take 1 tablet by mouth every 6 (six) hours as needed for pain.  15 tablet  0  . Lancets (FREESTYLE) lancets Use to check blood sugar once a day as instructed       . losartan (COZAAR) 50 MG tablet Take 1 tablet (50 mg total) by mouth daily.  90 tablet  1  . lovastatin (MEVACOR) 20 MG tablet Take 1 tablet (20 mg total) by mouth every evening.  90 tablet  1  . metFORMIN (GLUCOPHAGE) 500 MG tablet Take 1 tablet (500 mg total) by mouth 2 (two) times daily with a meal.  180 tablet  1  . metoprolol (LOPRESSOR) 100 MG tablet Take 1 tablet (100 mg total) by mouth 2 (two) times daily.  60 tablet  3  . Naproxen Sodium 750 MG TB24 Take 750 mg by mouth as needed. with food      . potassium chloride SA (K-DUR,KLOR-CON) 20 MEQ tablet Take 2 tablets (40 mEq total) by mouth 2 (two) times daily.  60 tablet  2  . spironolactone (ALDACTONE) 50 MG tablet Take 1 tablet (50 mg total) by mouth daily.  30 tablet  5   No current facility-administered medications on file prior to visit.    BP 156/80   Temp(Src) 97.9 F (36.6 C) (Oral)  Wt 214 lb (97.07 kg)  BMI 39.13 kg/m2       Objective:   Physical Exam  Constitutional: She is oriented to person, place, and time. She appears well-developed and well-nourished. No distress.  HENT:  Head: Normocephalic and atraumatic.  Right Ear: Tympanic membrane and ear canal normal.  Left Ear: Tympanic membrane and ear canal normal.  Mouth/Throat: Posterior oropharyngeal erythema present.  Faint white film noted on tonsils and posterior oropharynx.    Cardiovascular: Normal rate and regular rhythm.   No murmur heard. Pulmonary/Chest: Effort normal and breath sounds normal. No respiratory distress. She has no wheezes. She has no rales. She exhibits no tenderness.  Neurological: She is alert and oriented to person, place, and time.  Psychiatric: She has a normal mood and affect. Her behavior is normal. Judgment and thought content normal.          Assessment & Plan:

## 2013-02-06 DIAGNOSIS — R490 Dysphonia: Secondary | ICD-10-CM | POA: Insufficient documentation

## 2013-02-06 NOTE — Assessment & Plan Note (Signed)
Not improved. Will refer to ENT for further evaluation. Follow up rapid strep negative.  Strep probe sent to lab.

## 2013-02-07 LAB — CULTURE, GROUP A STREP

## 2013-02-08 ENCOUNTER — Telehealth: Payer: Self-pay | Admitting: Family

## 2013-02-08 MED ORDER — CEFDINIR 300 MG PO CAPS: 300.0000 mg | ORAL_CAPSULE | Freq: Two times a day (BID) | ORAL | Status: DC

## 2013-02-08 NOTE — Telephone Encounter (Signed)
Notified pt and she voices understanding. Pt requests work note for today and tomorrow. Will return to work Monday. Work note left at front desk for pt to pick up.

## 2013-02-08 NOTE — Telephone Encounter (Signed)
Please call patient and let her know that her step came back positive from the lab.  I will send omnicef for her to start.  This likely explains her hoarseness so I think she can hold off on ENT referral at this time.  Let me know if hoarseness does not improve.

## 2013-02-09 ENCOUNTER — Ambulatory Visit: Payer: Self-pay | Admitting: Family

## 2013-02-15 ENCOUNTER — Telehealth: Payer: Self-pay | Admitting: Family

## 2013-02-15 DIAGNOSIS — R490 Dysphonia: Secondary | ICD-10-CM

## 2013-02-15 NOTE — Addendum Note (Signed)
Addended by: Sandford Craze on: 02/15/2013 09:44 PM   Modules accepted: Orders

## 2013-02-15 NOTE — Telephone Encounter (Addendum)
Patient would like to proceed with referral to ENT regarding her strep throat. She states that this has been going on for over a month now and her voice is hoarse.

## 2013-02-23 ENCOUNTER — Ambulatory Visit: Payer: 59 | Attending: Otolaryngology

## 2013-02-23 DIAGNOSIS — R490 Dysphonia: Secondary | ICD-10-CM | POA: Insufficient documentation

## 2013-02-23 DIAGNOSIS — IMO0001 Reserved for inherently not codable concepts without codable children: Secondary | ICD-10-CM | POA: Insufficient documentation

## 2013-04-10 ENCOUNTER — Ambulatory Visit: Payer: Self-pay | Admitting: Family

## 2013-04-13 ENCOUNTER — Ambulatory Visit (INDEPENDENT_AMBULATORY_CARE_PROVIDER_SITE_OTHER): Payer: 59 | Admitting: Family

## 2013-04-13 ENCOUNTER — Encounter: Payer: Self-pay | Admitting: Family

## 2013-04-13 VITALS — BP 136/96 | HR 72 | Temp 98.3°F | Resp 16 | Ht 63.5 in | Wt 215.1 lb

## 2013-04-13 DIAGNOSIS — I10 Essential (primary) hypertension: Secondary | ICD-10-CM

## 2013-04-13 DIAGNOSIS — E876 Hypokalemia: Secondary | ICD-10-CM

## 2013-04-13 DIAGNOSIS — E785 Hyperlipidemia, unspecified: Secondary | ICD-10-CM

## 2013-04-13 DIAGNOSIS — R5383 Other fatigue: Secondary | ICD-10-CM | POA: Insufficient documentation

## 2013-04-13 DIAGNOSIS — R5381 Other malaise: Secondary | ICD-10-CM

## 2013-04-13 DIAGNOSIS — R350 Frequency of micturition: Secondary | ICD-10-CM

## 2013-04-13 DIAGNOSIS — E119 Type 2 diabetes mellitus without complications: Secondary | ICD-10-CM

## 2013-04-13 LAB — CBC WITH DIFFERENTIAL/PLATELET
Basophils Absolute: 0 10*3/uL (ref 0.0–0.1)
Basophils Relative: 0 % (ref 0–1)
Eosinophils Absolute: 0.2 10*3/uL (ref 0.0–0.7)
Eosinophils Relative: 3 % (ref 0–5)
HCT: 40 % (ref 36.0–46.0)
Hemoglobin: 13.6 g/dL (ref 12.0–15.0)
Lymphocytes Relative: 42 % (ref 12–46)
Lymphs Abs: 2.9 10*3/uL (ref 0.7–4.0)
MCH: 27.9 pg (ref 26.0–34.0)
MCHC: 34 g/dL (ref 30.0–36.0)
MCV: 82.1 fL (ref 78.0–100.0)
Monocytes Absolute: 0.4 10*3/uL (ref 0.1–1.0)
Monocytes Relative: 6 % (ref 3–12)
Neutro Abs: 3.4 10*3/uL (ref 1.7–7.7)
Neutrophils Relative %: 49 % (ref 43–77)
Platelets: 557 10*3/uL — ABNORMAL HIGH (ref 150–400)
RBC: 4.87 MIL/uL (ref 3.87–5.11)
RDW: 14.3 % (ref 11.5–15.5)
WBC: 6.9 10*3/uL (ref 4.0–10.5)

## 2013-04-13 LAB — LIPID PANEL
Cholesterol: 228 mg/dL — ABNORMAL HIGH (ref 0–200)
HDL: 43 mg/dL (ref >39)
LDL Cholesterol: 159 mg/dL — ABNORMAL HIGH (ref 0–99)
Total CHOL/HDL Ratio: 5.3 Ratio
Triglycerides: 131 mg/dL (ref <150)
VLDL: 26 mg/dL (ref 0–40)

## 2013-04-13 LAB — BASIC METABOLIC PANEL WITH GFR
BUN: 12 mg/dL (ref 6–23)
CO2: 34 mEq/L — ABNORMAL HIGH (ref 19–32)
Calcium: 9.3 mg/dL (ref 8.4–10.5)
Chloride: 95 mEq/L — ABNORMAL LOW (ref 96–112)
Creat: 0.83 mg/dL (ref 0.50–1.10)
GFR, Est African American: >89 mL/min
GFR, Est Non African American: 86 mL/min
Glucose, Bld: 167 mg/dL — ABNORMAL HIGH (ref 70–99)
Potassium: 3.3 mEq/L — ABNORMAL LOW (ref 3.5–5.3)
Sodium: 138 mEq/L (ref 135–145)

## 2013-04-13 LAB — HEMOGLOBIN A1C
Hgb A1c MFr Bld: 7 % — ABNORMAL HIGH (ref <5.7)
Mean Plasma Glucose: 154 mg/dL — ABNORMAL HIGH (ref <117)

## 2013-04-13 LAB — TSH: TSH: 3.561 u[IU]/mL (ref 0.350–4.500)

## 2013-04-13 MED ORDER — LOVASTATIN 20 MG PO TABS: 20.0000 mg | ORAL_TABLET | Freq: Every evening | ORAL | Status: DC

## 2013-04-13 MED ORDER — METOPROLOL TARTRATE 100 MG PO TABS: 100.0000 mg | ORAL_TABLET | Freq: Two times a day (BID) | ORAL | Status: DC

## 2013-04-13 MED ORDER — HYDROCHLOROTHIAZIDE 25 MG PO TABS: 25.0000 mg | ORAL_TABLET | Freq: Every day | ORAL | Status: DC

## 2013-04-13 MED ORDER — AMLODIPINE BESYLATE 5 MG PO TABS: 5.0000 mg | ORAL_TABLET | Freq: Every day | ORAL | Status: DC

## 2013-04-13 MED ORDER — POTASSIUM CHLORIDE CRYS ER 20 MEQ PO TBCR: 40.0000 meq | EXTENDED_RELEASE_TABLET | Freq: Two times a day (BID) | ORAL | Status: DC

## 2013-04-13 NOTE — Assessment & Plan Note (Signed)
Continue metformin.  Urinary frequency could be related to sugars- obtain culture to exclude UTI.  Check bmet, A1C.

## 2013-04-13 NOTE — Progress Notes (Signed)
Subjective:    Patient ID: Susan Whitaker, female    DOB: 02/13/1971, 42 y.o.   MRN: [id number]  HPI  Susan Whitaker is a 42 yr old female who presents today for follow up of multiple medical problems.  1) DM2- maintained on metformin.  Last A1C 6.5.  2) HTN-She is maintained on losartan, amlodipine, hctz, aldactone, metoprolol.  3) Hyperlipidemia- she is maintained on mevacor.  4) Fatigue- reports ongoing fatigue.    Review of Systems    see HPI   Reports + urinary frequency but denies dysuria.   Past Medical History  Diagnosis Date  . GERD (gastroesophageal reflux disease)   . Hypertension   . Gastric ulcer   . Diabetes mellitus     type II  . Urinary incontinence   . Thrombocytopenia   . Hyperlipidemia   . Allergy     allergic rhinitis  . Hypokalemia   . H/O hemorrhoids   . H/O constipation   . FHx: hypertension   . H/O varicella   . Thyroid fullness 08/2005  . Increased BMI 06/2010  . SVT (supraventricular tachycardia)     Noted 11/2011 admission  . Primary hyperaldosteronism 02/21/2012    History   Social History  . Marital Status: Single    Spouse Name: N/A    Number of Children: 2  . Years of Education: N/A   Occupational History  . 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 Smoking status: Never Smoker   . Smokeless tobacco: Never Used  . Alcohol Use: No  . Drug Use: No  . Sexual Activity: Not on file   Other Topics Concern  . Not on file   Social History Narrative   Works at [employer]          Past Surgical History  Procedure Laterality Date  . Abdominal surgery      73 months of age-- unsure of type of surgery  . Hemorrhoid surgery  2005/2006  . Uterine fibroid embolization      Family History  Problem Relation Age of Onset  . Cancer Other     breast, lung  . Hypertension Other   . Stroke Other   . Cancer Mother     breast  . Stroke Mother   . Cancer Maternal Grandmother     breast  . Hypertension Maternal  Grandmother   . Vision loss Father   . Heart disease Father     Rhythm disturbance    Allergies  Allergen Reactions  . Lisinopril     REACTION: ACE cough    Current Outpatient Prescriptions on File Prior to Visit  Medication Sig Dispense Refill  . AMBULATORY NON FORMULARY MEDICATION Place 600 mg vaginally 2 (two) times a week. Medication Name: Boric Acid Suppository 600 mg 1 pv twice weekly as directed  30 suppository  11  . Calcium Carbonate-Vitamin D (CALTRATE 600+D) 600-400 MG-UNIT per tablet Take 1 tablet by mouth 2 (two) times daily.      Marland Kitchen FeFum-FePoly-FA-B Cmp-C-Biot (INTEGRA PLUS) CAPS TAKE ONE CAPSULE BY MOUTH ONCE DAILY  30 each  3  . Lancets (FREESTYLE) lancets Use to check blood sugar once a day as instructed       . losartan (COZAAR) 50 MG tablet Take 1 tablet (50 mg total) by mouth daily.  90 tablet  1  . metFORMIN (GLUCOPHAGE) 500 MG tablet Take 1 tablet (500 mg total) by mouth 2 (two) times daily with a  meal.  180 tablet  1  . Naproxen Sodium 750 MG TB24 Take 750 mg by mouth as needed. with food      . spironolactone (ALDACTONE) 50 MG tablet Take 1 tablet (50 mg total) by mouth daily.  30 tablet  5   No current facility-administered medications on file prior to visit.    BP 136/96  Pulse 72  Temp(Src) 98.3 F (36.8 C) (Oral)  Resp 16  Ht 5' 3.5" (1.613 m)  Wt 215 lb 1.3 oz (97.56 kg)  BMI 37.50 kg/m2  SpO2 98%    Objective:   Physical Exam  Constitutional: She appears well-developed and well-nourished. No distress.  HENT:  Head: Normocephalic and atraumatic.  Cardiovascular: Normal rate and regular rhythm.   No murmur heard. Pulmonary/Chest: Effort normal and breath sounds normal. No respiratory distress. She has no wheezes. She has no rales. She exhibits no tenderness.  Musculoskeletal: She exhibits no edema.  Skin:  Hypertrophic toenails bilaterally. See DM foot exam.   Psychiatric: She has a normal mood and affect. Her behavior is normal. Judgment  and thought content normal.          Assessment & Plan:

## 2013-04-13 NOTE — Assessment & Plan Note (Signed)
Pt reports not taking Kdur- resume.

## 2013-04-13 NOTE — Assessment & Plan Note (Signed)
Tolerating mevacor- continue statin, obtain flp/lft

## 2013-04-13 NOTE — Patient Instructions (Addendum)
Please complete your lab work prior to leaving. Schedule follow up eye exam.   Follow up in 3 months.

## 2013-04-13 NOTE — Assessment & Plan Note (Signed)
Stable. Continue current meds.  BP: 136/96 mmHg

## 2013-04-13 NOTE — Assessment & Plan Note (Signed)
Check cbc, tsh.

## 2013-04-14 LAB — URINE CULTURE: Colony Count: 25000

## 2013-04-14 LAB — MICROALBUMIN / CREATININE URINE RATIO
Creatinine, Urine: 104.6 mg/dL
Microalb Creat Ratio: 51.1 mg/g — ABNORMAL HIGH (ref 0.0–30.0)
Microalb, Ur: 5.35 mg/dL — ABNORMAL HIGH (ref 0.00–1.89)

## 2013-04-15 ENCOUNTER — Telehealth: Payer: Self-pay | Admitting: Family

## 2013-04-15 DIAGNOSIS — E785 Hyperlipidemia, unspecified: Secondary | ICD-10-CM

## 2013-04-15 MED ORDER — ATORVASTATIN CALCIUM 40 MG PO TABS: 40.0000 mg | ORAL_TABLET | Freq: Every day | ORAL | Status: DC

## 2013-04-15 NOTE — Telephone Encounter (Signed)
Sugar above goal.  Increase metformin from 500mg  twice daily to 1000mg  twice daily.  Potassium low, restart K+ supplement as we discussed at visit. Cholesterol above goal.  Stop mevacor, start atorvastatin. Repeat FLP/LFT in 6 weeks. Thyroid function normal.  + protein in urine. Continue cozaar to protect kidneys- important to keep sugar and bp strictly controlled. Urine culture appears contaminated.  No infection at this time.

## 2013-04-16 NOTE — Telephone Encounter (Signed)
Notified pt. Lab order entered. 

## 2013-06-20 ENCOUNTER — Telehealth: Payer: Self-pay | Admitting: Family

## 2013-06-20 DIAGNOSIS — E042 Nontoxic multinodular goiter: Secondary | ICD-10-CM

## 2013-06-20 NOTE — Telephone Encounter (Signed)
Left detailed message on cell# to call and verify that she is agreement with follow up ultrasound.

## 2013-06-20 NOTE — Telephone Encounter (Signed)
Please call pt and let her know our records show she is due for follow up thyroid ultrasound to follow up thyroid nodules.  Pended below.

## 2013-06-21 NOTE — Telephone Encounter (Signed)
Patient states that she would like to proceed with thyroid ultrasound

## 2013-06-22 NOTE — Telephone Encounter (Signed)
Order signed.

## 2013-06-26 ENCOUNTER — Other Ambulatory Visit (HOSPITAL_BASED_OUTPATIENT_CLINIC_OR_DEPARTMENT_OTHER): Payer: Self-pay

## 2013-06-28 ENCOUNTER — Ambulatory Visit (HOSPITAL_BASED_OUTPATIENT_CLINIC_OR_DEPARTMENT_OTHER): Admission: RE | Admit: 2013-06-28 | Payer: 59 | Source: Ambulatory Visit

## 2013-07-18 ENCOUNTER — Ambulatory Visit (HOSPITAL_BASED_OUTPATIENT_CLINIC_OR_DEPARTMENT_OTHER)
Admission: RE | Admit: 2013-07-18 | Discharge: 2013-07-18 | Disposition: A | Discharge status: Home / Self Care | Payer: 59 | Source: Ambulatory Visit | Attending: Family | Admitting: Family

## 2013-07-18 DIAGNOSIS — E041 Nontoxic single thyroid nodule: Secondary | ICD-10-CM | POA: Insufficient documentation

## 2013-07-18 DIAGNOSIS — E042 Nontoxic multinodular goiter: Secondary | ICD-10-CM

## 2013-07-30 ENCOUNTER — Ambulatory Visit (INDEPENDENT_AMBULATORY_CARE_PROVIDER_SITE_OTHER): Payer: 59 | Admitting: Family

## 2013-07-30 ENCOUNTER — Encounter: Payer: Self-pay | Admitting: Family

## 2013-07-30 VITALS — BP 170/100 | HR 93 | Temp 98.9°F | Resp 16 | Ht 63.5 in | Wt 215.0 lb

## 2013-07-30 DIAGNOSIS — R002 Palpitations: Secondary | ICD-10-CM

## 2013-07-30 DIAGNOSIS — F411 Generalized anxiety disorder: Secondary | ICD-10-CM

## 2013-07-30 MED ORDER — LOSARTAN POTASSIUM 100 MG PO TABS: 100.0000 mg | ORAL_TABLET | Freq: Every day | ORAL | Status: DC

## 2013-07-30 NOTE — Progress Notes (Signed)
Subjective:    Patient ID: Susan Whitaker, female    DOB: 12-Dec-1970, 43 y.o.   MRN: [id number]  HPI  Ms. Foskey is a 43 yr old female who presents today to discuss stress. Recently started a new job.  Reports that every morning around 8 AM-9AM when she arrives at work she has heart fluttering and nervousness.  Reports poor work environment and is not happy with her job. She had similar episode this AM which was associated with vomitting.  Denies current or previous episode of chest pain. Reports that she is not sleeping well. Denies anxiety or palpitations when she is outside of work. "It only happens when I am at work."  She reports that she "hates going to work."  She is interviewing for another job.  Doesn't like the environment.      Review of Systems See HPI  Past Medical History  Diagnosis Date  . GERD (gastroesophageal reflux disease)   . Hypertension   . Gastric ulcer   . Diabetes mellitus     type II  . Urinary incontinence   . Thrombocytopenia   . Hyperlipidemia   . Allergy     allergic rhinitis  . Hypokalemia   . H/O hemorrhoids   . H/O constipation   . FHx: hypertension   . H/O varicella   . Thyroid fullness 08/2005  . Increased BMI 06/2010  . SVT (supraventricular tachycardia)     Noted 11/2011 admission  . Primary hyperaldosteronism 02/21/2012    History   Social History  . Marital Status: Single    Spouse Name: N/A    Number of Children: 2  . Years of Education: N/A   Occupational History  . 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 Smoking status: Never Smoker   . Smokeless tobacco: Never Used  . Alcohol Use: No  . Drug Use: No  . Sexual Activity: Not on file   Other Topics Concern  . Not on file   Social History Narrative   Works at [employer]          Past Surgical History  Procedure Laterality Date  . Abdominal surgery      28 months of age-- unsure of type of surgery  . Hemorrhoid surgery  2005/2006  . Uterine  fibroid embolization      Family History  Problem Relation Age of Onset  . Cancer Other     breast, lung  . Hypertension Other   . Stroke Other   . Cancer Mother     breast  . Stroke Mother   . Cancer Maternal Grandmother     breast  . Hypertension Maternal Grandmother   . Vision loss Father   . Heart disease Father     Rhythm disturbance    Allergies  Allergen Reactions  . Lisinopril     REACTION: ACE cough    Current Outpatient Prescriptions on File Prior to Visit  Medication Sig Dispense Refill  . AMBULATORY NON FORMULARY MEDICATION Place 600 mg vaginally 2 (two) times a week. Medication Name: Boric Acid Suppository 600 mg 1 pv twice weekly as directed  30 suppository  11  . amLODipine (NORVASC) 5 MG tablet Take 1 tablet (5 mg total) by mouth daily.  30 tablet  5  . atorvastatin (LIPITOR) 40 MG tablet Take 1 tablet (40 mg total) by mouth daily.  30 tablet  2  . Calcium Carbonate-Vitamin D (CALTRATE 600+D) 600-400 MG-UNIT per tablet  Take 1 tablet by mouth 2 (two) times daily.      Marland Kitchen FeFum-FePoly-FA-B Cmp-C-Biot (INTEGRA PLUS) CAPS TAKE ONE CAPSULE BY MOUTH ONCE DAILY  30 each  3  . hydrochlorothiazide (HYDRODIURIL) 25 MG tablet Take 1 tablet (25 mg total) by mouth daily.  30 tablet  5  . Lancets (FREESTYLE) lancets Use to check blood sugar once a day as instructed       . metFORMIN (GLUCOPHAGE) 500 MG tablet Take 1,000 mg by mouth 2 (two) times daily with a meal.      . metoprolol (LOPRESSOR) 100 MG tablet Take 1 tablet (100 mg total) by mouth 2 (two) times daily.  60 tablet  5  . Naproxen Sodium 750 MG TB24 Take 750 mg by mouth as needed. with food      . potassium chloride SA (K-DUR,KLOR-CON) 20 MEQ tablet Take 2 tablets (40 mEq total) by mouth 2 (two) times daily.  60 tablet  5  . spironolactone (ALDACTONE) 50 MG tablet Take 1 tablet (50 mg total) by mouth daily.  30 tablet  5   No current facility-administered medications on file prior to visit.    BP 170/100  Pulse  93  Temp(Src) 98.9 F (37.2 C) (Oral)  Resp 16  Ht 5' 3.5" (1.613 m)  Wt 215 lb (97.523 kg)  BMI 37.48 kg/m2  SpO2 99%  LMP 07/01/2013       Objective:   Physical Exam  Constitutional: She is oriented to person, place, and time. She appears well-developed and well-nourished. No distress.  Cardiovascular: Normal rate and regular rhythm.   No murmur heard. Pulmonary/Chest: Effort normal and breath sounds normal. No respiratory distress. She has no wheezes. She has no rales. She exhibits no tenderness.  Neurological: She is alert and oriented to person, place, and time.  Psychiatric: She has a normal mood and affect. Her behavior is normal. Judgment and thought content normal.          Assessment & Plan:

## 2013-07-30 NOTE — Progress Notes (Signed)
Pre visit review using our clinic review tool, if applicable. No additional management support is needed unless otherwise documented below in the visit note. 

## 2013-07-30 NOTE — Patient Instructions (Signed)
Increase losartan to 100mg  once daily. Go to the ER if you develop chest pain. Follow up in 1-2 weeks.

## 2013-07-31 ENCOUNTER — Telehealth: Payer: Self-pay | Admitting: Family

## 2013-07-31 DIAGNOSIS — E876 Hypokalemia: Secondary | ICD-10-CM

## 2013-07-31 NOTE — Telephone Encounter (Signed)
Please call pt and ask her to return to the lab for bmet when she is able. I would like to check her potassium. (Dx hypokalemia).

## 2013-07-31 NOTE — Assessment & Plan Note (Signed)
Patient has symptoms of anxiety which are only occuring at work. Declines referral to therapist.  Would like to avoid additional medication. We discussed looking for a new job, increasing exercise, and relaxation techniques to help her cope.  30 minutes spent with pt today. >50% of this time was spent counseling pt on her anxiety.  EKG is performed today and notes NSR without acute changes. She is instructed to go to the ED if she develops chest pain.

## 2013-08-01 NOTE — Telephone Encounter (Signed)
Notified pt and entered lab order.

## 2013-08-22 ENCOUNTER — Ambulatory Visit: Payer: Self-pay | Admitting: Family

## 2013-08-22 DIAGNOSIS — Z0289 Encounter for other administrative examinations: Secondary | ICD-10-CM

## 2013-11-15 ENCOUNTER — Telehealth: Payer: Self-pay | Admitting: Family

## 2013-11-15 NOTE — Telephone Encounter (Signed)
Patient left message stating she has lost her Blood pressure and heart medications, she is needing to have these refilled at her [facility] because she has been out of the medications for several days

## 2013-11-19 MED ORDER — HYDROCHLOROTHIAZIDE 25 MG PO TABS: 25.0000 mg | ORAL_TABLET | Freq: Every day | ORAL | Status: DC

## 2013-11-19 MED ORDER — LOSARTAN POTASSIUM 100 MG PO TABS: 100.0000 mg | ORAL_TABLET | Freq: Every day | ORAL | Status: DC

## 2013-11-19 MED ORDER — METOPROLOL TARTRATE 100 MG PO TABS: 100.0000 mg | ORAL_TABLET | Freq: Two times a day (BID) | ORAL | Status: DC

## 2013-11-19 NOTE — Telephone Encounter (Signed)
Refills sent to pharmacy. Please call pt and let her know she is past due for a follow up of blood pressure and arrange follow up soon.

## 2013-11-19 NOTE — Telephone Encounter (Signed)
Informed patient of medication refill and she scheduled appointment for 11/26/13

## 2013-11-26 ENCOUNTER — Telehealth: Payer: Self-pay

## 2013-11-26 ENCOUNTER — Ambulatory Visit: Payer: Self-pay | Admitting: Family

## 2013-11-26 DIAGNOSIS — E119 Type 2 diabetes mellitus without complications: Secondary | ICD-10-CM

## 2013-11-26 DIAGNOSIS — E785 Hyperlipidemia, unspecified: Secondary | ICD-10-CM

## 2013-11-26 NOTE — Telephone Encounter (Signed)
Diabetic Bundle- labs ordered and pt informed and voiced understanding  Pt is seeing Lenna Sciara next week so will get BP checked then

## 2013-12-03 ENCOUNTER — Ambulatory Visit (INDEPENDENT_AMBULATORY_CARE_PROVIDER_SITE_OTHER): Payer: 59 | Admitting: Family

## 2013-12-03 ENCOUNTER — Encounter: Payer: Self-pay | Admitting: Family

## 2013-12-03 ENCOUNTER — Telehealth: Payer: Self-pay | Admitting: Family

## 2013-12-03 VITALS — BP 138/94 | HR 73 | Temp 98.5°F | Resp 16 | Ht 63.5 in | Wt 216.1 lb

## 2013-12-03 DIAGNOSIS — E785 Hyperlipidemia, unspecified: Secondary | ICD-10-CM

## 2013-12-03 DIAGNOSIS — I10 Essential (primary) hypertension: Secondary | ICD-10-CM

## 2013-12-03 DIAGNOSIS — E119 Type 2 diabetes mellitus without complications: Secondary | ICD-10-CM

## 2013-12-03 LAB — BASIC METABOLIC PANEL WITH GFR
BUN: 13 mg/dL (ref 6–23)
CO2: 29 mEq/L (ref 19–32)
Calcium: 8.8 mg/dL (ref 8.4–10.5)
Chloride: 99 mEq/L (ref 96–112)
Creat: 0.83 mg/dL (ref 0.50–1.10)
GFR, Est African American: >89 mL/min
GFR, Est Non African American: 86 mL/min
Glucose, Bld: 151 mg/dL — ABNORMAL HIGH (ref 70–99)
Potassium: 3.8 mEq/L (ref 3.5–5.3)
Sodium: 136 mEq/L (ref 135–145)

## 2013-12-03 LAB — HEPATIC FUNCTION PANEL
ALT: 10 U/L (ref 0–35)
AST: 12 U/L (ref 0–37)
Albumin: 3.7 g/dL (ref 3.5–5.2)
Alkaline Phosphatase: 54 U/L (ref 39–117)
Bilirubin, Direct: <0.1 mg/dL (ref 0.0–0.3)
Total Bilirubin: 0.3 mg/dL (ref 0.2–1.2)
Total Protein: 7.2 g/dL (ref 6.0–8.3)

## 2013-12-03 LAB — HEMOGLOBIN A1C
Hgb A1c MFr Bld: 8 % — ABNORMAL HIGH (ref <5.7)
Mean Plasma Glucose: 183 mg/dL — ABNORMAL HIGH (ref <117)

## 2013-12-03 LAB — LIPID PANEL
Cholesterol: 201 mg/dL — ABNORMAL HIGH (ref 0–200)
HDL: 38 mg/dL — ABNORMAL LOW (ref >39)
LDL Cholesterol: 121 mg/dL — ABNORMAL HIGH (ref 0–99)
Total CHOL/HDL Ratio: 5.3 Ratio
Triglycerides: 208 mg/dL — ABNORMAL HIGH (ref <150)
VLDL: 42 mg/dL — ABNORMAL HIGH (ref 0–40)

## 2013-12-03 NOTE — Assessment & Plan Note (Addendum)
Stable on metoprolol 100 mg bid, losartan 100 mg daily, and HCTZ 25 mg daily. Continue same. Check BMET today. She is having some BLE edema likely from amlodipine. Discussed elevating feet when possible. Already on HCTZ and spironolactone so will not add lasix.

## 2013-12-03 NOTE — Progress Notes (Signed)
   Subjective:    Patient ID: Susan Whitaker, female    DOB: January 25, 1971, 43 y.o.   MRN: [id number]  HPI Susan Whitaker is a 43 yo female here today for follow up.  HYPERTENSION: Disease Monitoring: Maintained on metoprolol 100 mg bid, losartan 100 mg daily, and HCTZ 25 mg daily Chest pain, palpitations-   no    Dyspnea-  no Medications compliance- yes  Lightheadedness,Syncope-     Edema- yes, swelling daily in bilateral ankles/feet on days that she works but not on days that she doesn't work. She is on her feet quite a bit at work. BP Readings from Last 3 Encounters:  12/03/13 138/94  07/30/13 170/100  04/13/13 136/96   FASTING HYPERGLYCEMIA /DIABETES:  Blood Sugar range: Runs 112-136 checked at different times of day. Visual problems- no Swims 2-3 days a week 10-15 laps. Avoids sweets in diet. Medications compliance- Maintained on metformin 1000 mg bid. Hypoglycemic symptoms- no Plans to make appointment today for eye exam. Lab Results  Component Value Date   HGBA1C 7.0* 04/13/2013   HYPERLIPIDEMIA: Began taking atorvastatin 40 mg daily November 2014. Denies myalgias.  Lab Results  Component Value Date   CHOL 228* 04/13/2013   HDL 43 04/13/2013   LDLCALC 159* 04/13/2013   LDLDIRECT 156.3 04/22/2008   TRIG 131 04/13/2013   CHOLHDL 5.3 04/13/2013   Review of Systems  Constitutional: Negative for fever, chills and diaphoresis.       Has occasional hot flashes in am.  Cardiovascular: Negative for palpitations.  Gastrointestinal: Negative for diarrhea, constipation and blood in stool.  Musculoskeletal: Negative for myalgias.       Objective:   Physical Exam  Constitutional: She is oriented to person, place, and time. She appears well-developed and well-nourished. No distress.  HENT:  Head: Normocephalic and atraumatic.  Mouth/Throat: No oropharyngeal exudate.  Eyes: Pupils are equal, round, and reactive to light. No scleral icterus.  Cardiovascular: Normal rate,  regular rhythm, normal heart sounds and intact distal pulses.  Exam reveals no gallop and no friction rub.   No murmur heard. Pulmonary/Chest: Effort normal. No respiratory distress. She has no wheezes. She has no rales.  Abdominal: Soft. Bowel sounds are normal. She exhibits no distension and no mass. There is no tenderness. There is no rebound and no guarding.  Musculoskeletal: She exhibits edema.  1+ edema BLE.  Lymphadenopathy:    She has no cervical adenopathy.  Neurological: She is alert and oriented to person, place, and time.  Skin: Skin is warm and dry. No rash noted. She is not diaphoretic. No erythema.  Psychiatric: She has a normal mood and affect. Her behavior is normal.          Assessment & Plan:  Patient seen by [facility] NP-student.  I have personally seen and examined patient and agree with Susan Whitaker's assessment and plan.

## 2013-12-03 NOTE — Assessment & Plan Note (Signed)
Obtain A1C today.  

## 2013-12-03 NOTE — Assessment & Plan Note (Signed)
Began taking atorvastatin 40 mg daily November 2014. Do fasting lipid profile and LFTs today. Lab Results  Component Value Date   CHOL 228* 04/13/2013   HDL 43 04/13/2013   LDLCALC 159* 04/13/2013   LDLDIRECT 156.3 04/22/2008   TRIG 131 04/13/2013   CHOLHDL 5.3 04/13/2013

## 2013-12-03 NOTE — Progress Notes (Signed)
Pre visit review using our clinic review tool, if applicable. No additional management support is needed unless otherwise documented below in the visit note. 

## 2013-12-03 NOTE — Patient Instructions (Signed)
Please complete lab work prior to leaving. Follow up in 3 months.  

## 2013-12-03 NOTE — Telephone Encounter (Signed)
Relevant patient education assigned to patient using Emmi. ° °

## 2013-12-05 ENCOUNTER — Telehealth: Payer: Self-pay | Admitting: Family

## 2013-12-05 ENCOUNTER — Telehealth: Payer: Self-pay

## 2013-12-05 MED ORDER — GLIPIZIDE 5 MG PO TABS: 5.0000 mg | ORAL_TABLET | Freq: Two times a day (BID) | ORAL | Status: DC

## 2013-12-05 MED ORDER — ATORVASTATIN CALCIUM 80 MG PO TABS: 80.0000 mg | ORAL_TABLET | Freq: Every day | ORAL | Status: DC

## 2013-12-05 NOTE — Telephone Encounter (Signed)
Cholesterol is above goal. Increase atorvastatin from 40 to 80mg .  DM above goal.  Add glyburide twice daily.  Call if sugars <80.

## 2013-12-05 NOTE — Telephone Encounter (Signed)
Pt was contacted and Lab Results were given. Pt voiced understanding of results. Pt was advised of 2 Rx's sent over to her local Pharm/as well as to contact NP if sugars are <80

## 2013-12-15 NOTE — Telephone Encounter (Signed)
Did you get this phone note?

## 2013-12-17 ENCOUNTER — Other Ambulatory Visit: Payer: Self-pay

## 2013-12-17 NOTE — Telephone Encounter (Signed)
It appears our temp handled the call previously and documented it in a new encounter.  Eliott Nine, CMA at 12/05/2013 10:59 AM     Status: Signed        Pt was contacted and Lab Results were given. Pt voiced understanding of results. Pt was advised of 2 Rx's sent over to her local Pharm/as well as to contact NP if sugars are <80

## 2014-01-02 ENCOUNTER — Telehealth: Payer: Self-pay

## 2014-01-02 NOTE — Telephone Encounter (Signed)
Patient had BP, LDL and A1C done on 12-03-13  Too early to recheck

## 2014-02-14 ENCOUNTER — Other Ambulatory Visit: Payer: Self-pay

## 2014-02-14 DIAGNOSIS — Z1239 Encounter for other screening for malignant neoplasm of breast: Secondary | ICD-10-CM

## 2014-02-18 ENCOUNTER — Ambulatory Visit (INDEPENDENT_AMBULATORY_CARE_PROVIDER_SITE_OTHER): Payer: 59 | Admitting: Family Medicine

## 2014-02-18 ENCOUNTER — Encounter: Payer: Self-pay | Admitting: Family Medicine

## 2014-02-18 VITALS — BP 138/84 | HR 78 | Temp 99.1°F | Ht 63.5 in | Wt 214.5 lb

## 2014-02-18 DIAGNOSIS — R300 Dysuria: Secondary | ICD-10-CM

## 2014-02-18 LAB — POCT URINALYSIS DIPSTICK
Bilirubin, UA: NEGATIVE
Glucose, UA: NEGATIVE
Ketones, UA: NEGATIVE
Nitrite, UA: NEGATIVE
Spec Grav, UA: 1.015
Urobilinogen, UA: 0.2
pH, UA: 6.5

## 2014-02-18 MED ORDER — CIPROFLOXACIN HCL 250 MG PO TABS: 250.0000 mg | ORAL_TABLET | Freq: Two times a day (BID) | ORAL | Status: DC

## 2014-02-18 NOTE — Progress Notes (Signed)
No chief complaint on file.   HPI:  Acute visit for Dysuria: -reports: started a few days ago -symptoms: dysuria, frequency, urgency -denies: nausea, vomiting, fevers, flank pain, fevers -FDLMP: Sept 15, 2015  ROS: See pertinent positives and negatives per HPI.  Past Medical History  Diagnosis Date  . GERD (gastroesophageal reflux disease)   . Hypertension   . Gastric ulcer   . Diabetes mellitus     type II  . Urinary incontinence   . Thrombocytopenia   . Hyperlipidemia   . Allergy     allergic rhinitis  . Hypokalemia   . H/O hemorrhoids   . H/O constipation   . FHx: hypertension   . H/O varicella   . Thyroid fullness 08/2005  . Increased BMI 06/2010  . SVT (supraventricular tachycardia)     Noted 11/2011 admission  . Primary hyperaldosteronism 02/21/2012    Past Surgical History  Procedure Laterality Date  . Abdominal surgery      31 months of age-- unsure of type of surgery  . Hemorrhoid surgery  2005/2006  . Uterine fibroid embolization      Family History  Problem Relation Age of Onset  . Cancer Other     breast, lung  . Hypertension Other   . Stroke Other   . Cancer Mother     breast  . Stroke Mother   . Cancer Maternal Grandmother     breast  . Hypertension Maternal Grandmother   . Vision loss Father   . Heart disease Father     Rhythm disturbance    History   Social History  . Marital Status: Single    Spouse Name: N/A    Number of Children: 2  . Years of Education: N/A   Occupational History  . 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 Smoking status: Never Smoker   . Smokeless tobacco: Never Used  . Alcohol Use: No  . Drug Use: No  . Sexual Activity: None   Other Topics Concern  . None   Social History Narrative   Works at [employer] NON FORMULARY MEDICATION, Place 600 mg vaginally 2 (two) times a week. Medication Name: Boric Acid Suppository 600 mg 1 pv  twice weekly as directed, Disp: 30 suppository, Rfl: 11;  amLODipine (NORVASC) 5 MG tablet, Take 1 tablet (5 mg total) by mouth daily., Disp: 30 tablet, Rfl: 5;  atorvastatin (LIPITOR) 80 MG tablet, Take 1 tablet (80 mg total) by mouth daily., Disp: 30 tablet, Rfl: 2 Calcium Carbonate-Vitamin D (CALTRATE 600+D) 600-400 MG-UNIT per tablet, Take 1 tablet by mouth 2 (two) times daily. PT ONLY TAKES 1 A DAY., Disp: , Rfl: ;  FeFum-FePoly-FA-B Cmp-C-Biot (INTEGRA PLUS) CAPS, TAKE ONE CAPSULE BY MOUTH ONCE DAILY, Disp: 30 each, Rfl: 3;  glipiZIDE (GLUCOTROL) 5 MG tablet, Take 1 tablet (5 mg total) by mouth 2 (two) times daily before a meal., Disp: 60 tablet, Rfl: 3 Lancets (FREESTYLE) lancets, Use to check blood sugar once a day as instructed , Disp: , Rfl: ;  losartan (COZAAR) 100 MG tablet, Take 1 tablet (100 mg total) by mouth daily., Disp: 30 tablet, Rfl: 0;  metFORMIN (GLUCOPHAGE) 500 MG tablet, Take 1,000 mg by mouth 2 (two) times daily with a meal., Disp: , Rfl: ;  metoprolol (LOPRESSOR) 100 MG tablet, Take 1 tablet (100 mg total) by mouth 2 (two) times daily., Disp: 60 tablet, Rfl: 0  Naproxen Sodium 750 MG TB24, Take 750 mg by mouth as needed. with food, Disp: , Rfl: ;  OVER THE COUNTER MEDICATION, Take 1 tablet by mouth daily. POTASSIUM, Disp: , Rfl: ;  spironolactone (ALDACTONE) 50 MG tablet, Take 1 tablet (50 mg total) by mouth daily., Disp: 30 tablet, Rfl: 5;  ciprofloxacin (CIPRO) 250 MG tablet, Take 1 tablet (250 mg total) by mouth 2 (two) times daily., Disp: 6 tablet, Rfl: 0  EXAM:  Filed Vitals:   02/18/14 1434  BP: 138/84  Pulse: 78  Temp: 99.1 F (37.3 C)    Body mass index is 37.4 kg/(m^2).  GENERAL: vitals reviewed and listed above, alert, oriented, appears well hydrated and in no acute distress  HEENT: atraumatic, conjunttiva clear, no obvious abnormalities on inspection of external nose and ears  NECK: no obvious masses on inspection  LUNGS: clear to auscultation bilaterally,  no wheezes, rales or rhonchi, good air movement  CV: HRRR, no peripheral edema  ABD: BS+, soft, NTTP, no CVA TTP  MS: moves all extremities without noticeable abnormality  PSYCH: pleasant and cooperative, no obvious depression or anxiety  ASSESSMENT AND PLAN:  Discussed the following assessment and plan:  Dysuria - Plan: ciprofloxacin (CIPRO) 250 MG tablet  -she wants to do empiric abx for likely UTI - risks and interactions discussed -culture pending -Patient advised to return or notify a doctor immediately if symptoms worsen or persist or new concerns arise.  Patient Instructions  -can use Azo over the counter for discomfort per instructions  -As we discussed, we have prescribed a new medication (COIPROFLOXACIN) for you at this appointment. We discussed the common and serious potential adverse effects of this medication and you can review these and more with the pharmacist when you pick up your medication.  Please follow the instructions for use carefully and notify us immediately if you have any problems taking this medication.  -follow up with Dr. Inda Castle if symptoms persist or worsen or if culture shows NO infection      [name], Benson

## 2014-02-18 NOTE — Addendum Note (Signed)
Addended by: Agnes Lawrence on: 02/18/2014 02:55 PM   Modules accepted: Orders

## 2014-02-18 NOTE — Patient Instructions (Addendum)
-  can use Azo over the counter for discomfort per instructions  -As we discussed, we have prescribed a new medication (COIPROFLOXACIN) for you at this appointment. We discussed the common and serious potential adverse effects of this medication and you can review these and more with the pharmacist when you pick up your medication.  Please follow the instructions for use carefully and notify us immediately if you have any problems taking this medication.  -follow up with Dr. Inda Castle if symptoms persist or worsen or if culture shows NO infection

## 2014-02-18 NOTE — Progress Notes (Signed)
Pre visit review using our clinic review tool, if applicable. No additional management support is needed unless otherwise documented below in the visit note. 

## 2014-02-19 ENCOUNTER — Telehealth: Payer: Self-pay

## 2014-02-19 ENCOUNTER — Telehealth: Payer: Self-pay | Admitting: Family

## 2014-02-19 NOTE — Telephone Encounter (Signed)
Called patient back and review the advice given this morning.  Pt stated that she's taking the antibiotics, but has not started the AZO yet.  Pt's main concern is the "bladder pain" she is experiencing.  Pt was encouraged to take tylenol for pain until she is able to pick up AZO from store.  She was also encouraged to drink plenty of fluids to assist with flushing out the urinary tract.  She was advised that if no relief within 2 days to call office back for further instructions.

## 2014-02-19 NOTE — Telephone Encounter (Signed)
Pt called this morning and spoke with a nurse from [facility].  Please see report below:    [facility] Triage Call Report Triage Record Num: [id number] Operator: Claudine Mouton Patient Name: Susan Whitaker Call Date & Time: 02/19/2014 5:00:22AM Patient Phone: [phone] PCP: Patient Gender: Female PCP Fax : Patient DOB: Jul 14, 1970 Practice Name: [facility]  Reason for Call: Caller: Kanija/Patient; PCP: Inda Castle, Melissa (Adults only); CB#: [phone]; Call regarding UTI; Pt was seen in office on 10/5 Pt was diagnosised with UTI. Pt was given an rx for Cipro and was advised to use AZO. She is calling because she still has discomfort. She has not tried to use AZO. She purchased Cystex and states that it is not working. Pt given reassurance that antibiotic has not had enough time to work on symptoms. Suggested that she get AZO and monitor blood sugars. Pt triaged per 'Urnary symptoms-female' protocol and 'All other situations' generated a yes. Pt agreed to contact office if symptoms do not improve with home care suggestions. She agreed.  Protocol(s) Used: Urinary Symptoms - Female Recommended Outcome per Protocol: See Provider within 2 Weeks Reason for Outcome: All other situations  Care Advice: Take showers rather than baths. Do not use bubble baths or bath oils. Do not douche. Avoid feminine hygiene sprays or deodorants. ~Drink 8-12 eight ounce (1.6 - 2.4 L) glasses of liquid each day to keep the urine from becoming too concentrated. Concentrated urine irritates the bladder and can lead to urgency. ~ Do not change prescribed medications, dosing regimen, or other treatments until consulting with your provider. ~ Follow prior advice of provider for similar problems; if symptoms recur or new symptoms develop, call provider. ~ SYMPTOM / CONDITION MANAGEMENT

## 2014-02-19 NOTE — Telephone Encounter (Signed)
Noted  

## 2014-02-19 NOTE — Telephone Encounter (Signed)
[facility] Triage Call Report Triage Record Num: [id number] Operator: Claudine Mouton Patient Name: Susan Whitaker Call Date & Time: 02/19/2014 5:00:22AM Patient Phone: [phone] PCP: Patient Gender: Female PCP Fax : Patient DOB: 04-03-1971 Practice Name:  - [facility] Reason for Call: Caller: Susan Whitaker/Patient; PCP: Inda Castle, Melissa (Adults only); CB#: [phone]; Call regarding UTI; Pt was seen in office on 10/5 Pt was diagnosised with UTI. Pt was given an rx for Cipro and was advised to use AZO. She is calling because she still has discomfort. She has not tried to use AZO. She purchased Cystex and states that it is not working. Pt given reassurance that antibiotic has not had enough time to work on symptoms. Suggested that she get AZO and monitor blood sugars. Pt triaged per 'Urnary symptoms-female' protocol and 'All other situations' generated a yes. Pt agreed to contact office if symptoms do not improve with home care suggestions. She agreed. Protocol(s) Used: Urinary Symptoms - Female Recommended Outcome per Protocol: See Provider within 2 Weeks Reason for Outcome: All other situations Care Advice: Take showers rather than baths. Do not use bubble baths or bath oils. Do not douche. Avoid feminine hygiene sprays or deodorants. ~ Drink 8-12 eight ounce (1.6 - 2.4 L) glasses of liquid each day to keep the urine from becoming too concentrated. Concentrated urine irritates the bladder and can lead to urgency. ~ ~ Do not change prescribed medications, dosing regimen, or other treatments until consulting with your provider. ~ Follow prior advice of provider for similar problems; if symptoms recur or new symptoms develop, call provider. ~ SYMPTOM / CONDITION MANAGEMENT

## 2014-02-19 NOTE — Telephone Encounter (Signed)
Caller name: Bethzaida  Call back number:[phone] Pharmacy:  Reason for call:  Pt was seen on 10/5 at [facility].  Has UTI and still in pain.  Is taking the antibiotics and wants to know what to do.  Advise.

## 2014-02-21 LAB — URINE CULTURE: Colony Count: >=100000

## 2014-02-26 ENCOUNTER — Ambulatory Visit: Payer: Self-pay | Admitting: Family

## 2014-02-27 ENCOUNTER — Ambulatory Visit: Payer: Self-pay | Admitting: Family

## 2014-03-01 ENCOUNTER — Ambulatory Visit: Payer: Self-pay | Admitting: Family

## 2014-03-04 ENCOUNTER — Ambulatory Visit (INDEPENDENT_AMBULATORY_CARE_PROVIDER_SITE_OTHER): Payer: 59 | Admitting: Family

## 2014-03-04 ENCOUNTER — Encounter: Payer: Self-pay | Admitting: Family

## 2014-03-04 VITALS — BP 130/85 | HR 94 | Temp 98.6°F | Resp 16 | Ht 63.5 in | Wt 212.4 lb

## 2014-03-04 DIAGNOSIS — E785 Hyperlipidemia, unspecified: Secondary | ICD-10-CM

## 2014-03-04 DIAGNOSIS — R300 Dysuria: Secondary | ICD-10-CM

## 2014-03-04 DIAGNOSIS — E1165 Type 2 diabetes mellitus with hyperglycemia: Secondary | ICD-10-CM

## 2014-03-04 DIAGNOSIS — E119 Type 2 diabetes mellitus without complications: Secondary | ICD-10-CM

## 2014-03-04 DIAGNOSIS — IMO0002 Reserved for concepts with insufficient information to code with codable children: Secondary | ICD-10-CM

## 2014-03-04 DIAGNOSIS — F411 Generalized anxiety disorder: Secondary | ICD-10-CM

## 2014-03-04 DIAGNOSIS — I10 Essential (primary) hypertension: Secondary | ICD-10-CM

## 2014-03-04 DIAGNOSIS — Z23 Encounter for immunization: Secondary | ICD-10-CM

## 2014-03-04 MED ORDER — POTASSIUM GLUCONATE 595 (99 K) MG PO TABS: ORAL_TABLET | ORAL | Status: DC

## 2014-03-04 NOTE — Progress Notes (Signed)
Pre visit review using our clinic review tool, if applicable. No additional management support is needed unless otherwise documented below in the visit note. 

## 2014-03-04 NOTE — Progress Notes (Signed)
Subjective:    Patient ID: [name], female    DOB: 05-23-1970, 43 y.o.   MRN: [id number]  HPI  Ms. Zurcher is a 43 yr old female who presents today for follow up of multiple medical problem:  1) Anxiety- continues to have stress at work. Reports that she is handling better.    2) DM2- maintained on metformin and glipizide.  Denies symptomatic hypoglycemia.  She reports that she has not been checking sugars because she lost her meter.  Last eye exam- >1 year ago- she will arrange appointment.   Lab Results  Component Value Date   HGBA1C 8.0* 12/03/2013   HGBA1C 7.0* 04/13/2013   HGBA1C 6.5* 10/27/2012   Lab Results  Component Value Date   MICROALBUR 5.35* 04/13/2013   LDLCALC 121* 12/03/2013   CREATININE 0.83 12/03/2013     3) Hyperlipidemia- Last LDL was 121.  Lab Results  Component Value Date   CHOL 201* 12/03/2013   HDL 38* 12/03/2013   LDLCALC 121* 12/03/2013   LDLDIRECT 156.3 04/22/2008   TRIG 208* 12/03/2013   CHOLHDL 5.3 12/03/2013     4) HTN-  Current meds include losartain, amlodipine, metoprolol, aldactone. Denies CP/SOB.   BP Readings from Last 3 Encounters:  03/04/14 170/90  02/18/14 138/84  12/03/13 138/94       Review of Systems See HPI  Past Medical History  Diagnosis Date  . GERD (gastroesophageal reflux disease)   . Hypertension   . Gastric ulcer   . Diabetes mellitus     type II  . Urinary incontinence   . Thrombocytopenia   . Hyperlipidemia   . Allergy     allergic rhinitis  . Hypokalemia   . H/O hemorrhoids   . H/O constipation   . FHx: hypertension   . H/O varicella   . Thyroid fullness 08/2005  . Increased BMI 06/2010  . SVT (supraventricular tachycardia)     Noted 11/2011 admission  . Primary hyperaldosteronism 02/21/2012    History   Social History  . Marital Status: Single    Spouse Name: N/A    Number of Children: 2  . Years of Education: N/A   Occupational History  . YOU[employer] EMPLOYMENT    Social History Main Topics    . Smoking status: Never Smoker   . Smokeless tobacco: Never Used  . Alcohol Use: No  . Drug Use: No  . Sexual Activity: Not on file   Other Topics Concern  . Not on file   Social History Narrative   Works at [employer]          Past Surgical History  Procedure Laterality Date  . Abdominal surgery      9 months of age-- unsure of type of surgery  . Hemorrhoid surgery  2005/2006  . Uterine fibroid embolization      Family History  Problem Relation Age of Onset  . Cancer Other     breast, lung  . Hypertension Other   . Stroke Other   . Cancer Mother     breast  . Stroke Mother   . Cancer Maternal Grandmother     breast  . Hypertension Maternal Grandmother   . Vision loss Father   . Heart disease Father     Rhythm disturbance    Allergies  Allergen Reactions  . Lisinopril     REACTION: ACE cough    Current Outpatient Prescriptions on File Prior to Visit  Medication Sig Dispense Refill  .  AMBULATORY NON FORMULARY MEDICATION Place 600 mg vaginally 2 (two) times a week. Medication Name: Boric Acid Suppository 600 mg 1 pv twice weekly as directed  30 suppository  11  . amLODipine (NORVASC) 5 MG tablet Take 1 tablet (5 mg total) by mouth daily.  30 tablet  5  . atorvastatin (LIPITOR) 80 MG tablet Take 1 tablet (80 mg total) by mouth daily.  30 tablet  2  . Calcium Carbonate-Vitamin D (CALTRATE 600+D) 600-400 MG-UNIT per tablet Take 1 tablet by mouth 2 (two) times daily. PT ONLY TAKES 1 A DAY.      . ciprofloxacin (CIPRO) 250 MG tablet Take 1 tablet (250 mg total) by mouth 2 (two) times daily.  6 tablet  0  . FeFum-FePoly-FA-B Cmp-C-Biot (INTEGRA PLUS) CAPS TAKE ONE CAPSULE BY MOUTH ONCE DAILY  30 each  3  . glipiZIDE (GLUCOTROL) 5 MG tablet Take 1 tablet (5 mg total) by mouth 2 (two) times daily before a meal.  60 tablet  3  . Lancets (FREESTYLE) lancets Use to check blood sugar once a day as instructed       . losartan (COZAAR) 100 MG tablet Take 1 tablet  (100 mg total) by mouth daily.  30 tablet  0  . metFORMIN (GLUCOPHAGE) 500 MG tablet Take 1,000 mg by mouth 2 (two) times daily with a meal.      . metoprolol (LOPRESSOR) 100 MG tablet Take 1 tablet (100 mg total) by mouth 2 (two) times daily.  60 tablet  0  . Naproxen Sodium 750 MG TB24 Take 750 mg by mouth as needed. with food      . OVER THE COUNTER MEDICATION Take 1 tablet by mouth daily. POTASSIUM      . spironolactone (ALDACTONE) 50 MG tablet Take 1 tablet (50 mg total) by mouth daily.  30 tablet  5   No current facility-administered medications on file prior to visit.    BP 130/85  Pulse 94  Temp(Src) 98.6 F (37 C) (Oral)  Resp 16  Ht 5' 3.5" (1.613 m)  Wt 212 lb 6.4 oz (96.344 kg)  BMI 37.03 kg/m2  SpO2 96%       Objective:   Physical Exam  Constitutional: She is oriented to person, place, and time. She appears well-developed and well-nourished. No distress.  HENT:  Head: Normocephalic and atraumatic.  Mouth/Throat: No oropharyngeal exudate.  Cardiovascular: Normal rate and regular rhythm.   No murmur heard. Pulmonary/Chest: Effort normal and breath sounds normal. No respiratory distress. She has no wheezes. She has no rales. She exhibits no tenderness.  Neurological: She is alert and oriented to person, place, and time.  Skin: Skin is warm and dry.  Psychiatric: She has a normal mood and affect. Her behavior is normal. Judgment and thought content normal.          Assessment & Plan:

## 2014-03-04 NOTE — Patient Instructions (Addendum)
Please complete lab work prior to leaving.   Schedule your annual eye exam. Follow up in 3 months.

## 2014-03-05 LAB — HEMOGLOBIN A1C: Hgb A1c MFr Bld: 7 % — ABNORMAL HIGH (ref 4.6–6.5)

## 2014-03-05 LAB — BASIC METABOLIC PANEL
BUN: 11 mg/dL (ref 6–23)
CO2: 26 mEq/L (ref 19–32)
Calcium: 8.7 mg/dL (ref 8.4–10.5)
Chloride: 103 mEq/L (ref 96–112)
Creatinine, Ser: 0.9 mg/dL (ref 0.4–1.2)
GFR: 87.08 mL/min (ref >60.00)
Glucose, Bld: 68 mg/dL — ABNORMAL LOW (ref 70–99)
Potassium: 3.1 mEq/L — ABNORMAL LOW (ref 3.5–5.1)
Sodium: 139 mEq/L (ref 135–145)

## 2014-03-05 LAB — HEPATIC FUNCTION PANEL
ALT: 21 U/L (ref 0–35)
AST: 26 U/L (ref 0–37)
Albumin: 3.2 g/dL — ABNORMAL LOW (ref 3.5–5.2)
Alkaline Phosphatase: 60 U/L (ref 39–117)
Bilirubin, Direct: 0 mg/dL (ref 0.0–0.3)
Total Bilirubin: 0.4 mg/dL (ref 0.2–1.2)
Total Protein: 7.9 g/dL (ref 6.0–8.3)

## 2014-03-05 LAB — LIPID PANEL
Cholesterol: 159 mg/dL (ref 0–200)
HDL: 38.9 mg/dL — ABNORMAL LOW (ref >39.00)
NonHDL: 120.1
Total CHOL/HDL Ratio: 4
Triglycerides: 267 mg/dL — ABNORMAL HIGH (ref 0.0–149.0)
VLDL: 53.4 mg/dL — ABNORMAL HIGH (ref 0.0–40.0)

## 2014-03-05 LAB — LDL CHOLESTEROL, DIRECT: Direct LDL: 103.1 mg/dL

## 2014-03-05 LAB — MICROALBUMIN / CREATININE URINE RATIO
Creatinine,U: 191.1 mg/dL
Microalb Creat Ratio: 2.7 mg/g (ref 0.0–30.0)
Microalb, Ur: 5.1 mg/dL — ABNORMAL HIGH (ref 0.0–1.9)

## 2014-03-06 ENCOUNTER — Other Ambulatory Visit: Payer: Self-pay

## 2014-03-06 DIAGNOSIS — Z1231 Encounter for screening mammogram for malignant neoplasm of breast: Secondary | ICD-10-CM

## 2014-03-06 LAB — URINE CULTURE
Colony Count: NO GROWTH
Organism ID, Bacteria: NO GROWTH

## 2014-03-07 ENCOUNTER — Encounter: Payer: Self-pay | Admitting: Family

## 2014-03-07 ENCOUNTER — Telehealth: Payer: Self-pay | Admitting: Family

## 2014-03-07 MED ORDER — METOPROLOL TARTRATE 100 MG PO TABS: 100.0000 mg | ORAL_TABLET | Freq: Two times a day (BID) | ORAL | Status: DC

## 2014-03-07 NOTE — Assessment & Plan Note (Signed)
Lab Results  Component Value Date   CHOL 159 03/04/2014   HDL 38.90* 03/04/2014   LDLCALC 121* 12/03/2013   LDLDIRECT 103.1 03/04/2014   TRIG 267.0* 03/04/2014   CHOLHDL 4 03/04/2014   Follow up LDL 103- continue lipitor.

## 2014-03-07 NOTE — Assessment & Plan Note (Signed)
Lab Results  Component Value Date   HGBA1C 7.0* 03/04/2014   A1C is improved since last visit. Continue current meds, diet.  Pt given rx for new meter today.

## 2014-03-07 NOTE — Assessment & Plan Note (Signed)
Uncontrolled. It appears she is off of beta blocker, resume- see phone note 10/22.

## 2014-03-07 NOTE — Assessment & Plan Note (Signed)
Stable.  Monitor.  

## 2014-03-07 NOTE — Telephone Encounter (Signed)
I noticed that she had not refilled metoprolol since July.  Please confirm with pt.  BP was elevated.  She should resume metoprolol and return for nurse visit BP check in 1 month. rx sent to her pharmacy.

## 2014-03-08 ENCOUNTER — Ambulatory Visit
Admission: RE | Admit: 2014-03-08 | Discharge: 2014-03-08 | Disposition: A | Discharge status: Home / Self Care | Payer: 59 | Source: Ambulatory Visit

## 2014-03-08 DIAGNOSIS — Z1231 Encounter for screening mammogram for malignant neoplasm of breast: Secondary | ICD-10-CM

## 2014-03-08 MED ORDER — AMLODIPINE BESYLATE 10 MG PO TABS: 10.0000 mg | ORAL_TABLET | Freq: Every day | ORAL | Status: DC

## 2014-03-08 NOTE — Telephone Encounter (Signed)
Left detailed message on pt's cell# and to call if any questions. 

## 2014-03-08 NOTE — Telephone Encounter (Signed)
Pt states she still takes metoprolol and has not been out of medication. States she just got a refill of Rx last month. Advised her that we sent additional refills and scheduled her for nurse visit on 04/08/14 at 3:45pm.  Please advise if there are further instructions.

## 2014-03-08 NOTE — Telephone Encounter (Signed)
Increase amlodipine from 5mg  to 10mg  and follow up in 1 month pls.

## 2014-03-18 ENCOUNTER — Encounter: Payer: 59 | Admitting: Family

## 2014-03-18 ENCOUNTER — Encounter: Payer: Self-pay | Admitting: Family

## 2014-03-18 DIAGNOSIS — Z0289 Encounter for other administrative examinations: Secondary | ICD-10-CM

## 2014-03-28 ENCOUNTER — Ambulatory Visit (INDEPENDENT_AMBULATORY_CARE_PROVIDER_SITE_OTHER): Payer: 59 | Admitting: Medical

## 2014-03-28 ENCOUNTER — Encounter: Payer: Self-pay | Admitting: Medical

## 2014-03-28 VITALS — BP 141/90 | HR 89 | Temp 98.9°F | Ht 63.5 in | Wt 213.6 lb

## 2014-03-28 DIAGNOSIS — J209 Acute bronchitis, unspecified: Secondary | ICD-10-CM | POA: Insufficient documentation

## 2014-03-28 DIAGNOSIS — R062 Wheezing: Secondary | ICD-10-CM | POA: Insufficient documentation

## 2014-03-28 DIAGNOSIS — H6590 Unspecified nonsuppurative otitis media, unspecified ear: Secondary | ICD-10-CM | POA: Insufficient documentation

## 2014-03-28 DIAGNOSIS — N910 Primary amenorrhea: Secondary | ICD-10-CM

## 2014-03-28 DIAGNOSIS — N926 Irregular menstruation, unspecified: Secondary | ICD-10-CM

## 2014-03-28 DIAGNOSIS — J208 Acute bronchitis due to other specified organisms: Secondary | ICD-10-CM

## 2014-03-28 DIAGNOSIS — H65192 Other acute nonsuppurative otitis media, left ear: Secondary | ICD-10-CM

## 2014-03-28 MED ORDER — AMOXICILLIN-POT CLAVULANATE 875-125 MG PO TABS: 1.0000 | ORAL_TABLET | Freq: Two times a day (BID) | ORAL | Status: DC

## 2014-03-28 MED ORDER — BENZONATATE 100 MG PO CAPS: 100.0000 mg | ORAL_CAPSULE | Freq: Three times a day (TID) | ORAL | Status: DC | PRN

## 2014-03-28 MED ORDER — BECLOMETHASONE DIPROPIONATE 40 MCG/ACT IN AERS: 2.0000 | INHALATION_SPRAY | Freq: Two times a day (BID) | RESPIRATORY_TRACT | Status: DC

## 2014-03-28 MED ORDER — ALBUTEROL SULFATE HFA 108 (90 BASE) MCG/ACT IN AERS: 2.0000 | INHALATION_SPRAY | Freq: Four times a day (QID) | RESPIRATORY_TRACT | Status: DC | PRN

## 2014-03-28 NOTE — Assessment & Plan Note (Addendum)
Pregnancy test negative. Possible early menopause or perimenopausal pattern.

## 2014-03-28 NOTE — Progress Notes (Signed)
Subjective:    Patient ID: Susan Whitaker, female    DOB: 10-27-1970, 43 y.o.   MRN: [id number]  HPI   Pt in sick since Saturday. Pt started out with st and then fever. Pt states now getting nasal and chest congestion. Also left ear pain. Pt chest wall hurts briefly second or two when coughing and deep breathing. Missed work this week. Pt is wheezing. No hx of asthma. Pt does not smoke but some second hand smoke with boyfriend.   LMP- Pt states thinks she is late not sure and request pregnancy test.  Past Medical History  Diagnosis Date  . GERD (gastroesophageal reflux disease)   . Hypertension   . Gastric ulcer   . Diabetes mellitus     type II  . Urinary incontinence   . Thrombocytopenia   . Hyperlipidemia   . Allergy     allergic rhinitis  . Hypokalemia   . H/O hemorrhoids   . H/O constipation   . FHx: hypertension   . H/O varicella   . Thyroid fullness 08/2005  . Increased BMI 06/2010  . SVT (supraventricular tachycardia)     Noted 11/2011 admission  . Primary hyperaldosteronism 02/21/2012    History   Social History  . Marital Status: Single    Spouse Name: N/A    Number of Children: 2  . Years of Education: N/A   Occupational History  . 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 Smoking status: Never Smoker   . Smokeless tobacco: Never Used  . Alcohol Use: No  . Drug Use: No  . Sexual Activity: Not on file   Other Topics Concern  . Not on file   Social History Narrative   Works at [employer]          Past Surgical History  Procedure Laterality Date  . Abdominal surgery      39 months of age-- unsure of type of surgery  . Hemorrhoid surgery  2005/2006  . Uterine fibroid embolization      Family History  Problem Relation Age of Onset  . Cancer Other     breast, lung  . Hypertension Other   . Stroke Other   . Cancer Mother     breast  . Stroke Mother   . Cancer Maternal Grandmother     breast  . Hypertension Maternal  Grandmother   . Vision loss Father   . Heart disease Father     Rhythm disturbance    Allergies  Allergen Reactions  . Lisinopril     REACTION: ACE cough    Current Outpatient Prescriptions on File Prior to Visit  Medication Sig Dispense Refill  . AMBULATORY NON FORMULARY MEDICATION Place 600 mg vaginally 2 (two) times a week. Medication Name: Boric Acid Suppository 600 mg 1 pv twice weekly as directed 30 suppository 11  . amLODipine (NORVASC) 10 MG tablet Take 1 tablet (10 mg total) by mouth daily. 30 tablet 2  . atorvastatin (LIPITOR) 80 MG tablet Take 1 tablet (80 mg total) by mouth daily. 30 tablet 2  . Calcium Carbonate-Vitamin D (CALTRATE 600+D) 600-400 MG-UNIT per tablet Take 1 tablet by mouth 2 (two) times daily. PT ONLY TAKES 1 A DAY.    . ciprofloxacin (CIPRO) 250 MG tablet Take 1 tablet (250 mg total) by mouth 2 (two) times daily. 6 tablet 0  . FeFum-FePoly-FA-B Cmp-C-Biot (INTEGRA PLUS) CAPS TAKE ONE CAPSULE BY MOUTH ONCE DAILY 30 each 3  .  glipiZIDE (GLUCOTROL) 5 MG tablet Take 1 tablet (5 mg total) by mouth 2 (two) times daily before a meal. 60 tablet 3  . Lancets (FREESTYLE) lancets Use to check blood sugar once a day as instructed     . losartan (COZAAR) 100 MG tablet Take 1 tablet (100 mg total) by mouth daily. 30 tablet 0  . metFORMIN (GLUCOPHAGE) 500 MG tablet Take 1,000 mg by mouth 2 (two) times daily with a meal.    . metoprolol (LOPRESSOR) 100 MG tablet Take 1 tablet (100 mg total) by mouth 2 (two) times daily. 60 tablet 2  . Naproxen Sodium 750 MG TB24 Take 750 mg by mouth as needed. with food    . OVER THE COUNTER MEDICATION Take 1 tablet by mouth daily. POTASSIUM    . potassium gluconate (GNP POTASSIUM GLUCONATE) 595 MG TABS tablet 2 tabs by mouth daily  0  . spironolactone (ALDACTONE) 50 MG tablet Take 1 tablet (50 mg total) by mouth daily. 30 tablet 5   No current facility-administered medications on file prior to visit.    BP 141/90 mmHg  Pulse 89   Temp(Src) 98.9 F (37.2 C)  Ht 5' 3.5" (1.613 m)  Wt 213 lb 9.6 oz (96.888 kg)  BMI 37.24 kg/m2  SpO2 96%      Review of Systems  Constitutional: Positive for fever and fatigue. Negative for chills and diaphoresis.  HENT: Positive for congestion, ear pain and sinus pressure. Negative for facial swelling, nosebleeds, postnasal drip, rhinorrhea, sneezing and sore throat.   Respiratory: Positive for cough and wheezing. Negative for chest tightness and shortness of breath.   Cardiovascular: Negative for chest pain and palpitations.  Genitourinary: Negative.   Musculoskeletal: Negative.   Neurological: Negative.   Hematological: Positive for adenopathy.       Faint mild tender submandibular nodes.  Psychiatric/Behavioral: Negative.        Objective:   Physical Exam   General  Mental Status - Alert. General Appearance - Well groomed. Not in acute distress.  Skin Rashes- No Rashes.  HEENT Head- Normal. Ear Auditory Canal - Left- Normal. Right - Normal.Tympanic Membrane- Left- tm moderate red. Right- Normal. Eye Sclera/Conjunctiva- Left- Normal. Right- Normal. Nose & Sinuses Nasal Mucosa- Left- Not Boggy or Congested. Right- Not Boggy or Congested. Maxillary sinus pressure. Mouth & Throat Lips: Upper Lip- Normal: no dryness, cracking, pallor, cyanosis, or vesicular eruption. Lower Lip-Normal: no dryness, cracking, pallor, cyanosis or vesicular eruption. Buccal Mucosa- Bilateral- No Aphthous ulcers. Oropharynx- No Discharge or Erythema. Tonsils: Characteristics- Bilateral- No Erythema or Congestion. Size/Enlargement- Bilateral- No enlargement. Discharge- bilateral-None.  Neck Neck- Supple. No Masses.   Chest and Lung Exam Auscultation: Breath Sounds:-Normal. CTA.  Cardiovascular Auscultation:Rythm- Regular, rate and rhythm. Murmurs & Other Heart Sounds:Ausculatation of the heart reveal- No Murmurs.  Lymphatic Head & Neck General Head & Neck Lymphatics: Bilateral:  Description- No Localized lymphadenopathy.         Assessment & Plan:

## 2014-03-28 NOTE — Patient Instructions (Addendum)
On physical exam you have lt om. Also you have bronchitis with wheezing. I am prescribing augmentin antibiotic, benzonatate for cough, and two inhalers(albuterol and qvar).   Your pregnancy test was negative.  Follow up in 7-10 days or as needed

## 2014-03-28 NOTE — Assessment & Plan Note (Signed)
I am prescribing augmentin antibiotic, benzonatate for cough

## 2014-03-28 NOTE — Assessment & Plan Note (Signed)
rx augmentin.

## 2014-03-28 NOTE — Assessment & Plan Note (Signed)
Rx albuterol and qvar inhalers.

## 2014-03-28 NOTE — Progress Notes (Signed)
Pre visit review using our clinic review tool, if applicable. No additional management support is needed unless otherwise documented below in the visit note. 

## 2014-04-01 ENCOUNTER — Encounter: Payer: Self-pay | Admitting: Family

## 2014-04-01 ENCOUNTER — Ambulatory Visit (HOSPITAL_BASED_OUTPATIENT_CLINIC_OR_DEPARTMENT_OTHER)
Admission: RE | Admit: 2014-04-01 | Discharge: 2014-04-01 | Disposition: A | Discharge status: Home / Self Care | Payer: 59 | Source: Ambulatory Visit | Attending: Family | Admitting: Family

## 2014-04-01 ENCOUNTER — Ambulatory Visit (INDEPENDENT_AMBULATORY_CARE_PROVIDER_SITE_OTHER): Payer: 59 | Admitting: Family

## 2014-04-01 VITALS — BP 130/80 | HR 71 | Temp 98.4°F | Resp 16 | Ht 63.5 in | Wt 215.0 lb

## 2014-04-01 DIAGNOSIS — J209 Acute bronchitis, unspecified: Secondary | ICD-10-CM

## 2014-04-01 DIAGNOSIS — R0602 Shortness of breath: Secondary | ICD-10-CM | POA: Insufficient documentation

## 2014-04-01 DIAGNOSIS — R05 Cough: Secondary | ICD-10-CM | POA: Insufficient documentation

## 2014-04-01 MED ORDER — PREDNISONE 10 MG PO TABS: ORAL_TABLET | ORAL | Status: DC

## 2014-04-01 MED ORDER — HYDROCOD POLST-CHLORPHEN POLST 10-8 MG/5ML PO LQCR: 5.0000 mL | Freq: Every evening | ORAL | Status: DC | PRN

## 2014-04-01 NOTE — Patient Instructions (Signed)
Please complete chest x ray on the first floor. Start prednisone. Continue augmentin.  You may use tussionex at bedtime as needed.  During the day you can use tessalon as needed. Call if symptoms worsen or if not improved in 3 days.

## 2014-04-01 NOTE — Progress Notes (Signed)
Pre visit review using our clinic review tool, if applicable. No additional management support is needed unless otherwise documented below in the visit note. 

## 2014-04-01 NOTE — Progress Notes (Signed)
Subjective:    Patient ID: Susan Whitaker, female    DOB: 1970/10/20, 43 y.o.   MRN: [id number]  HPI  Susan Whitaker is a 43 yr old female who presents today with chief complaint of cough. Symptoms started 5 days ago. She is on augmentin and tessalon (saw Percell Miller on 03/28/14). Reports that she started having chills on 11/9.  Reports that over the weekend she had sweats/coughing.  Went to work today.  Today she "spit up some blood."  Reports chest hurts when she coughs. + Nasal congestion. Reports subjective temp this weekend.  She started albuterol and qvar on Thursday.     Review of Systems See HPI  Past Medical History  Diagnosis Date  . GERD (gastroesophageal reflux disease)   . Hypertension   . Gastric ulcer   . Diabetes mellitus     type II  . Urinary incontinence   . Thrombocytopenia   . Hyperlipidemia   . Allergy     allergic rhinitis  . Hypokalemia   . H/O hemorrhoids   . H/O constipation   . FHx: hypertension   . H/O varicella   . Thyroid fullness 08/2005  . Increased BMI 06/2010  . SVT (supraventricular tachycardia)     Noted 11/2011 admission  . Primary hyperaldosteronism 02/21/2012    History   Social History  . Marital Status: Single    Spouse Name: N/A    Number of Children: 2  . Years of Education: N/A   Occupational History  . 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 Smoking status: Never Smoker   . Smokeless tobacco: Never Used  . Alcohol Use: No  . Drug Use: No  . Sexual Activity: Not on file   Other Topics Concern  . Not on file   Social History Narrative   Works at [employer]          Past Surgical History  Procedure Laterality Date  . Abdominal surgery      49 months of age-- unsure of type of surgery  . Hemorrhoid surgery  2005/2006  . Uterine fibroid embolization      Family History  Problem Relation Age of Onset  . Cancer Other     breast, lung  . Hypertension Other   . Stroke Other   . Cancer Mother    breast  . Stroke Mother   . Cancer Maternal Grandmother     breast  . Hypertension Maternal Grandmother   . Vision loss Father   . Heart disease Father     Rhythm disturbance    Allergies  Allergen Reactions  . Lisinopril     REACTION: ACE cough    Current Outpatient Prescriptions on File Prior to Visit  Medication Sig Dispense Refill  . albuterol (PROVENTIL HFA;VENTOLIN HFA) 108 (90 BASE) MCG/ACT inhaler Inhale 2 puffs into the lungs every 6 (six) hours as needed for wheezing or shortness of breath. 1 Inhaler 0  . AMBULATORY NON FORMULARY MEDICATION Place 600 mg vaginally 2 (two) times a week. Medication Name: Boric Acid Suppository 600 mg 1 pv twice weekly as directed 30 suppository 11  . amLODipine (NORVASC) 10 MG tablet Take 1 tablet (10 mg total) by mouth daily. 30 tablet 2  . amoxicillin-clavulanate (AUGMENTIN) 875-125 MG per tablet Take 1 tablet by mouth 2 (two) times daily. 20 tablet 0  . atorvastatin (LIPITOR) 80 MG tablet Take 1 tablet (80 mg total) by mouth daily. 30 tablet 2  .  beclomethasone (QVAR) 40 MCG/ACT inhaler Inhale 2 puffs into the lungs 2 (two) times daily. 1 Inhaler 0  . benzonatate (TESSALON) 100 MG capsule Take 1 capsule (100 mg total) by mouth 3 (three) times daily as needed for cough. 21 capsule 0  . Calcium Carbonate-Vitamin D (CALTRATE 600+D) 600-400 MG-UNIT per tablet Take 1 tablet by mouth 2 (two) times daily. PT ONLY TAKES 1 A DAY.    . ciprofloxacin (CIPRO) 250 MG tablet Take 1 tablet (250 mg total) by mouth 2 (two) times daily. 6 tablet 0  . FeFum-FePoly-FA-B Cmp-C-Biot (INTEGRA PLUS) CAPS TAKE ONE CAPSULE BY MOUTH ONCE DAILY 30 each 3  . glipiZIDE (GLUCOTROL) 5 MG tablet Take 1 tablet (5 mg total) by mouth 2 (two) times daily before a meal. 60 tablet 3  . Lancets (FREESTYLE) lancets Use to check blood sugar once a day as instructed     . losartan (COZAAR) 100 MG tablet Take 1 tablet (100 mg total) by mouth daily. 30 tablet 0  . metFORMIN (GLUCOPHAGE)  500 MG tablet Take 1,000 mg by mouth 2 (two) times daily with a meal.    . metoprolol (LOPRESSOR) 100 MG tablet Take 1 tablet (100 mg total) by mouth 2 (two) times daily. 60 tablet 2  . Naproxen Sodium 750 MG TB24 Take 750 mg by mouth as needed. with food    . OVER THE COUNTER MEDICATION Take 1 tablet by mouth daily. POTASSIUM    . potassium gluconate (GNP POTASSIUM GLUCONATE) 595 MG TABS tablet 2 tabs by mouth daily  0  . spironolactone (ALDACTONE) 50 MG tablet Take 1 tablet (50 mg total) by mouth daily. 30 tablet 5   No current facility-administered medications on file prior to visit.    BP 130/80 mmHg  Pulse 71  Temp(Src) 98.4 F (36.9 C) (Oral)  Resp 16  Ht 5' 3.5" (1.613 m)  Wt 215 lb (97.523 kg)  BMI 37.48 kg/m2  SpO2 99%  LMP 03/25/2014 (Approximate)       Objective:   Physical Exam  Constitutional: She is oriented to person, place, and time. She appears well-developed and well-nourished. No distress.  HENT:  Head: Normocephalic and atraumatic.  Right Ear: Tympanic membrane and ear canal normal.  Left Ear: Tympanic membrane and ear canal normal.  Mouth/Throat: No oropharyngeal exudate, posterior oropharyngeal edema or posterior oropharyngeal erythema.  Cardiovascular: Normal rate and regular rhythm.   No murmur heard. Pulmonary/Chest: Effort normal. She has wheezes.  Musculoskeletal: She exhibits no edema.  Neurological: She is alert and oriented to person, place, and time.  Skin: Skin is warm and dry.  Psychiatric: She has a normal mood and affect. Her behavior is normal. Judgment and thought content normal.          Assessment & Plan:

## 2014-04-02 ENCOUNTER — Telehealth: Payer: Self-pay | Admitting: Family

## 2014-04-02 NOTE — Telephone Encounter (Signed)
Notified pt and she voices understanding. Pt states she never started the inhalers that Percell Miller ordered as she doesn't think she has asthma. Thinks she was just wheezing due to her sinuses when she saw Percell Miller.  Please advise.

## 2014-04-02 NOTE — Telephone Encounter (Signed)
Caller name: Virtie, Bungert Relation to pt: self  Call back number: [phone]  Reason for call:  Pt inquirng about x ray results, pt states leave a detail message

## 2014-04-02 NOTE — Telephone Encounter (Signed)
Inhalers will help her wheezing and coughing. I recommend that she start inhalers.

## 2014-04-02 NOTE — Telephone Encounter (Signed)
Please let pt know that her chest x ray is negative for pneumonia. I think that the prednisone is going to help her symptoms. I do not think she needs antibiotic at this time. Let me know if her symptoms worsen or if symptoms do not improve in 3 days.

## 2014-04-03 NOTE — Telephone Encounter (Signed)
Notified pt and she states she will start them today.

## 2014-04-06 NOTE — Assessment & Plan Note (Signed)
CXR is performed and is unremarkable.  Start prednisone. Continue augmentin.  You may use tussionex at bedtime as needed.  During the day you can use tessalon as needed. Call if symptoms worsen or if not improved in 3 days.

## 2014-05-13 LAB — HM DIABETES EYE EXAM

## 2014-05-16 ENCOUNTER — Encounter: Payer: Self-pay | Admitting: *Deleted

## 2014-05-22 ENCOUNTER — Other Ambulatory Visit: Payer: Self-pay | Admitting: Family

## 2014-05-22 NOTE — Telephone Encounter (Signed)
Received refill request of spironolactone. Last refill done by [place] was 01/2013.  Is it ok to send refills?

## 2014-05-22 NOTE — Telephone Encounter (Signed)
Spoke with pt. She states her daughter works for a pharmacy and she was getting Rx through her?. States she has been taking them every day. Sent 30 day supply and advised pt she is due for office visit. Pt states she will call back tomorrow and schedule f/u for February.

## 2014-05-22 NOTE — Telephone Encounter (Addendum)
Please contact pt and ask her if she has been taking regularly (has another provider been rx'ing?).  If she has been taking then ok to send 30 day supply 0 refills. She is due for OV. If she has not been taking, then I would like her to follow up in office please and we can evaluate need to restart.

## 2014-06-10 ENCOUNTER — Ambulatory Visit (INDEPENDENT_AMBULATORY_CARE_PROVIDER_SITE_OTHER): Payer: 59 | Admitting: Family Medicine

## 2014-06-10 ENCOUNTER — Telehealth: Payer: Self-pay | Admitting: *Deleted

## 2014-06-10 VITALS — BP 120/80 | HR 85 | Temp 99.6°F | Resp 16 | Ht 63.0 in | Wt 217.0 lb

## 2014-06-10 DIAGNOSIS — R109 Unspecified abdominal pain: Secondary | ICD-10-CM

## 2014-06-10 DIAGNOSIS — N3000 Acute cystitis without hematuria: Secondary | ICD-10-CM

## 2014-06-10 DIAGNOSIS — R300 Dysuria: Secondary | ICD-10-CM

## 2014-06-10 LAB — POCT CBC
Granulocyte percent: 54 %G (ref 37–80)
HCT, POC: 42 % (ref 37.7–47.9)
Hemoglobin: 13.5 g/dL (ref 12.2–16.2)
Lymph, poc: 4.9 — AB (ref 0.6–3.4)
MCH, POC: 28 pg (ref 27–31.2)
MCHC: 32.1 g/dL (ref 31.8–35.4)
MCV: 87.1 fL (ref 80–97)
MID (cbc): 0.8 (ref 0–0.9)
MPV: 6.7 fL (ref 0–99.8)
POC Granulocyte: 6.7 (ref 2–6.9)
POC LYMPH PERCENT: 39.6 %L (ref 10–50)
POC MID %: 6.4 %M (ref 0–12)
Platelet Count, POC: 487 10*3/uL — AB (ref 142–424)
RBC: 4.82 M/uL (ref 4.04–5.48)
RDW, POC: 13.9 %
WBC: 12.4 10*3/uL — AB (ref 4.6–10.2)

## 2014-06-10 LAB — POCT UA - MICROSCOPIC ONLY
Casts, Ur, LPF, POC: NEGATIVE
Crystals, Ur, HPF, POC: NEGATIVE
Mucus, UA: NEGATIVE
Yeast, UA: NEGATIVE

## 2014-06-10 LAB — POCT URINALYSIS DIPSTICK
Bilirubin, UA: NEGATIVE
Glucose, UA: NEGATIVE
Ketones, UA: NEGATIVE
Nitrite, UA: POSITIVE
Protein, UA: 100
Spec Grav, UA: 1.02
Urobilinogen, UA: 1
pH, UA: 7

## 2014-06-10 LAB — GLUCOSE, POCT (MANUAL RESULT ENTRY): POC Glucose: 129 mg/dl — AB (ref 70–99)

## 2014-06-10 MED ORDER — CEPHALEXIN 500 MG PO CAPS: 500.0000 mg | ORAL_CAPSULE | Freq: Three times a day (TID) | ORAL | Status: DC

## 2014-06-10 MED ORDER — PHENAZOPYRIDINE HCL 200 MG PO TABS: 200.0000 mg | ORAL_TABLET | Freq: Three times a day (TID) | ORAL | Status: DC | PRN
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
## 2014-06-10 NOTE — Patient Instructions (Signed)
Drink plenty of fluids  Take the cephalexin 1 pill 3 times daily for infection   Take the Pyridium 1 pill 3 times daily only if needed for bladder pain  Return if worse or go to the emergency room if necessary

## 2014-06-10 NOTE — Telephone Encounter (Signed)
Patient called regarding blurry vision that she has been experiencing for the past few days.  She states that she had an eye appointment 3 weeks ago and notified the ophthalmologist that she was diabetic, but when she wears her glasses she still has blurry vision.  Her speech and mentation are clear and she denies headache or dizziness.  She is concerned that her blood sugar is high.  She denies nausea, thirst, weakness, or abdominal pain but does have polyuria and feels that she has a UTI.  She endorses burning with urination also.  She is able to check her temperature and does not have a fever, but does not have a way to check her blood sugar.  She has been taking her Metformin as scheduled.    Advised patient to get blood sugar checked at [facility] and patient stated understanding.  Appointment made with Debbrah Alar for 06/11/14 at 2:00 to evaluate UTI.    Patient states that she will be getting rides with her daughter to get blood sugar checked and to appointment tomorrow.

## 2014-06-11 ENCOUNTER — Ambulatory Visit (INDEPENDENT_AMBULATORY_CARE_PROVIDER_SITE_OTHER): Payer: 59 | Admitting: Family

## 2014-06-11 ENCOUNTER — Encounter: Payer: Self-pay | Admitting: Family

## 2014-06-11 VITALS — BP 147/75 | HR 67 | Temp 99.4°F | Resp 16 | Wt 217.6 lb

## 2014-06-11 DIAGNOSIS — H538 Other visual disturbances: Secondary | ICD-10-CM

## 2014-06-11 LAB — POCT CBG MONITORING: Glucose: 145 mg/dL (ref 60–200)

## 2014-06-11 NOTE — Patient Instructions (Signed)
Please keep your appointment with eye doctor this afternoon. Ask them to fax me report and call me tomorrow to let me know how you are feeling and what the Eye doctor recommends. Go to ER if you develop facial drooping, numbness/weakness.

## 2014-06-11 NOTE — Progress Notes (Signed)
Subjective:    Patient ID: Susan Whitaker, female    DOB: 11/15/1970, 44 y.o.   MRN: [id number]  HPI Susan Whitaker is here today for blurred/double vision since Thursday.  It is constant.Her vision is blurry close-up but not at a distance. Denies ever having blurriness like this before. Denies dry eyes. Denies  Associated numbness/tingling in arms and legs. Denies headache.  She got new glasses two weeks ago but has hardly worn them. Reports that she tried some otc reading glasses at the store yesterday and her vision was clearer.She has an optometrist at [place] today. BS is 145. She states it is normally 120-130 . Review of Systems  HENT: Negative for congestion.   Eyes: Negative for photophobia, pain and itching.       Reports eyes are often watery.   See HPI  Past Medical History  Diagnosis Date  . GERD (gastroesophageal reflux disease)   . Hypertension   . Gastric ulcer   . Diabetes mellitus     type II  . Urinary incontinence   . Thrombocytopenia   . Hyperlipidemia   . Allergy     allergic rhinitis  . Hypokalemia   . H/O hemorrhoids   . H/O constipation   . FHx: hypertension   . H/O varicella   . Thyroid fullness 08/2005  . Increased BMI 06/2010  . SVT (supraventricular tachycardia)     Noted 11/2011 admission  . Primary hyperaldosteronism 02/21/2012    History   Social History  . Marital Status: Single    Spouse Name: N/A    Number of Children: 2  . Years of Education: N/A   Occupational History  . 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 Smoking status: Never Smoker   . Smokeless tobacco: Never Used  . Alcohol Use: No  . Drug Use: No  . Sexual Activity: Not on file   Other Topics Concern  . Not on file   Social History Narrative   Works at [employer]          Past Surgical History  Procedure Laterality Date  . Abdominal surgery      77 months of age-- unsure of type of surgery  . Hemorrhoid surgery  2005/2006  . Uterine fibroid  embolization      Family History  Problem Relation Age of Onset  . Cancer Other     breast, lung  . Hypertension Other   . Stroke Other   . Cancer Mother     breast  . Stroke Mother   . Cancer Maternal Grandmother     breast  . Hypertension Maternal Grandmother   . Vision loss Father   . Heart disease Father     Rhythm disturbance    Allergies  Allergen Reactions  . Lisinopril     REACTION: ACE cough    Current Outpatient Prescriptions on File Prior to Visit  Medication Sig Dispense Refill  . albuterol (PROVENTIL HFA;VENTOLIN HFA) 108 (90 BASE) MCG/ACT inhaler Inhale 2 puffs into the lungs every 6 (six) hours as needed for wheezing or shortness of breath. 1 Inhaler 0  . AMBULATORY NON FORMULARY MEDICATION Place 600 mg vaginally 2 (two) times a week. Medication Name: Boric Acid Suppository 600 mg 1 pv twice weekly as directed 30 suppository 11  . amLODipine (NORVASC) 10 MG tablet Take 1 tablet (10 mg total) by mouth daily. 30 tablet 2  . atorvastatin (LIPITOR) 80 MG tablet Take 1 tablet (  80 mg total) by mouth daily. 30 tablet 2  . beclomethasone (QVAR) 40 MCG/ACT inhaler Inhale 2 puffs into the lungs 2 (two) times daily. 1 Inhaler 0  . Calcium Carbonate-Vitamin D (CALTRATE 600+D) 600-400 MG-UNIT per tablet Take 1 tablet by mouth 2 (two) times daily. PT ONLY TAKES 1 A DAY.    . cephALEXin (KEFLEX) 500 MG capsule Take 1 capsule (500 mg total) by mouth 3 (three) times daily. 21 capsule 0  . chlorpheniramine-HYDROcodone (TUSSIONEX PENNKINETIC ER) 10-8 MG/5ML LQCR Take 5 mLs by mouth at bedtime as needed for cough. 115 mL 0  . FeFum-FePoly-FA-B Cmp-C-Biot (INTEGRA PLUS) CAPS TAKE ONE CAPSULE BY MOUTH ONCE DAILY 30 each 3  . glipiZIDE (GLUCOTROL) 5 MG tablet Take 1 tablet (5 mg total) by mouth 2 (two) times daily before a meal. 60 tablet 3  . Lancets (FREESTYLE) lancets Use to check blood sugar once a day as instructed     . losartan (COZAAR) 100 MG tablet Take 1 tablet (100 mg total)  by mouth daily. 30 tablet 0  . metFORMIN (GLUCOPHAGE) 500 MG tablet Take 1,000 mg by mouth 2 (two) times daily with a meal.    . metoprolol (LOPRESSOR) 100 MG tablet Take 1 tablet (100 mg total) by mouth 2 (two) times daily. 60 tablet 2  . Naproxen Sodium 750 MG TB24 Take 750 mg by mouth as needed. with food    . OVER THE COUNTER MEDICATION Take 1 tablet by mouth daily. POTASSIUM    . phenazopyridine (PYRIDIUM) 200 MG tablet Take 1 tablet (200 mg total) by mouth 3 (three) times daily as needed for pain. 10 tablet 0  . potassium gluconate (GNP POTASSIUM GLUCONATE) 595 MG TABS tablet 2 tabs by mouth daily  0  . predniSONE (DELTASONE) 10 MG tablet 4 tabs by mouth once daily x 2 days, then 3 tabs daily x 2 days, then 2 tabs daily x 2 days, then 1 tab daily x 2 days 20 tablet 0  . spironolactone (ALDACTONE) 50 MG tablet TAKE ONE TABLET BY MOUTH ONCE DAILY 30 tablet 0   No current facility-administered medications on file prior to visit.    BP 147/75 mmHg  Pulse 67  Temp(Src) 99.4 F (37.4 C) (Oral)  Resp 16  Wt 217 lb 9.6 oz (98.703 kg)  SpO2 97%       Objective:   Physical Exam  Constitutional: She is oriented to person, place, and time. She appears well-developed and well-nourished. No distress.  HENT:  Head: Normocephalic and atraumatic.  Eyes: Conjunctivae and EOM are normal. Pupils are equal, round, and reactive to light. Right eye exhibits no discharge. Left eye exhibits no discharge. No scleral icterus.  Cardiovascular: Normal rate, regular rhythm and normal heart sounds.   Pulmonary/Chest: Effort normal and breath sounds normal. No respiratory distress. She has no wheezes. She has no rales.  Neurological: She is alert and oriented to person, place, and time. She has normal strength. No cranial nerve deficit.  No facial droop or slurred speech noted.  Skin: Skin is warm. She is not diaphoretic.  Psychiatric: Judgment and thought content normal.  She became tearful during the  exam.          Assessment & Plan:  Patient seen along with Susan [name] NP-student.  I have personally seen and examined patient and agree with Susan Whitaker's assessment and plan- her vision was corrected with OTC readers and distance vision is reported to be OK. No neuro deficits.  Advised pt to keep apt this afternoon with opthalmology for further evaluation.  I have asked her to give me an update in AM and request opthalmologist to fax me their findings.  Debbrah Alar NP

## 2014-06-11 NOTE — Progress Notes (Signed)
Pre visit review using our clinic review tool, if applicable. No additional management support is needed unless otherwise documented below in the visit note. 

## 2014-06-11 NOTE — Assessment & Plan Note (Signed)
Since pt  was able to correct vision with readers, doubt sever underlying neurological issue. Instructed to keep appointment with eye doctor this afternoon and have them fax findings. Instructed to go to ER if she develops facial drooping, numbness/weakness.

## 2014-06-12 ENCOUNTER — Telehealth: Payer: Self-pay | Admitting: Family

## 2014-06-12 DIAGNOSIS — H538 Other visual disturbances: Secondary | ICD-10-CM

## 2014-06-12 NOTE — Telephone Encounter (Signed)
Caller name: Quierra, Silverio Relation to pt: self  Call back number: [phone]   Reason for call:  Pt would like to speak with NP regarding office visit with eye doctor. Pt was advised to call NP regarding vision. Please advise.

## 2014-06-12 NOTE — Telephone Encounter (Signed)
Noted, referral placed.  

## 2014-06-12 NOTE — Telephone Encounter (Signed)
Patient reports that she went to new Opthalmologist last week [Thursday] and that she has not been able to read & had blurry vision since that appointment; she was informed that the eyeglass prescription written/given was incorrect and was expecting callback today about her corrected eyeglasses, but as of yet has not heard back from their office. Pt states that she had to buy OTC reading glasses in the meantime to be able to read. Patient is unhappy with current Opthomologist and is requesting to have a Referral placed for a new Diabetes Certified Opthalmologist and would like a call back on Friday with status of referral [to let her know it has been placed] when provider returns to office.  Please Advise.

## 2014-06-13 LAB — URINE CULTURE

## 2014-06-13 LAB — HM DIABETES EYE EXAM

## 2014-06-14 NOTE — Telephone Encounter (Signed)
Notified pt, states she saw Dr Leonarda Salon on 06/13/14.

## 2014-06-20 MED ORDER — NITROFURANTOIN MONOHYD MACRO 100 MG PO CAPS: 100.0000 mg | ORAL_CAPSULE | Freq: Two times a day (BID) | ORAL | Status: DC

## 2014-06-20 NOTE — Addendum Note (Signed)
Addended by: Venetia Night on: 06/20/2014 04:26 PM   Modules accepted: Orders

## 2014-06-20 NOTE — Addendum Note (Signed)
Addended by: Venetia Night on: 06/20/2014 04:20 PM   Modules accepted: Orders

## 2014-06-27 ENCOUNTER — Other Ambulatory Visit: Payer: Self-pay | Admitting: Family

## 2014-06-27 NOTE — Telephone Encounter (Signed)
Please advise 

## 2014-06-28 ENCOUNTER — Telehealth: Payer: Self-pay | Admitting: Family

## 2014-06-28 ENCOUNTER — Encounter: Payer: 59 | Admitting: Family

## 2014-06-28 ENCOUNTER — Encounter: Payer: Self-pay | Admitting: Family

## 2014-06-28 NOTE — Telephone Encounter (Signed)
Pt had Cpe appt, pt did not show up, do we charge no show?

## 2014-06-28 NOTE — Telephone Encounter (Signed)
Yes please

## 2014-12-22 ENCOUNTER — Other Ambulatory Visit: Payer: Self-pay | Admitting: Family

## 2014-12-23 ENCOUNTER — Telehealth: Payer: Self-pay | Admitting: Family

## 2014-12-23 MED ORDER — METFORMIN HCL 500 MG PO TABS: 500.0000 mg | ORAL_TABLET | Freq: Two times a day (BID) | ORAL | Status: DC

## 2014-12-23 MED ORDER — LOSARTAN POTASSIUM 100 MG PO TABS: 100.0000 mg | ORAL_TABLET | Freq: Every day | ORAL | Status: DC

## 2014-12-23 MED ORDER — AMLODIPINE BESYLATE 10 MG PO TABS
10.0000 mg | ORAL_TABLET | Freq: Every day | ORAL | Status: DC
Start: 2014-12-23 — End: 2015-12-31

## 2014-12-23 NOTE — Telephone Encounter (Signed)
30 day supply of metoprolol sent to pharmacy.  Pt is due for fasting CPE.  Please call pt to arrange physical before further refills are due.  Thanks!

## 2014-12-23 NOTE — Telephone Encounter (Signed)
Refills sent for losartan, metformin and amlodipine. Notified pt and she voices understanding.

## 2014-12-23 NOTE — Telephone Encounter (Signed)
She is due for follow up.  I will plan to see her on Friday. Also, please send refills as needed. We can discuss cardiology referral at her apt on Friday.

## 2014-12-23 NOTE — Telephone Encounter (Signed)
Caller name: Susan Whitaker Relationship to patient: self Can be reached: [phone] (call her work # today please if you have questions or concerns)  Reason for call: Pt called in stating she lost the bag that contained all her meds and her diabetic testing supplies. She said that yesterday she had a heart episode and found 1 metoprolol in her purse. I advised her that RX for metoprolol was sent in this morning. She said that she got scared yesterday and asked if we can refer her back to Cardiology (the same man with blonde hair we sent her to before). She scheduled appt for this Friday 12/27/14 thinking she needs to come in for med refills. She also scheduled CPE for 01/29/15. Please call her if the appt Friday is not needed or with concerns for referral/cardiology or med issues.

## 2014-12-24 NOTE — Telephone Encounter (Signed)
cpe schedule for 01/29/15

## 2014-12-27 ENCOUNTER — Telehealth: Payer: Self-pay | Admitting: Lab

## 2014-12-27 ENCOUNTER — Encounter: Payer: Self-pay | Admitting: Family

## 2014-12-27 ENCOUNTER — Ambulatory Visit (INDEPENDENT_AMBULATORY_CARE_PROVIDER_SITE_OTHER): Payer: 59 | Admitting: Family

## 2014-12-27 ENCOUNTER — Telehealth: Payer: Self-pay | Admitting: Family

## 2014-12-27 VITALS — BP 116/82 | HR 69 | Temp 98.6°F | Ht 62.0 in | Wt 215.1 lb

## 2014-12-27 DIAGNOSIS — E876 Hypokalemia: Secondary | ICD-10-CM

## 2014-12-27 DIAGNOSIS — E119 Type 2 diabetes mellitus without complications: Secondary | ICD-10-CM

## 2014-12-27 DIAGNOSIS — I10 Essential (primary) hypertension: Secondary | ICD-10-CM | POA: Diagnosis not present

## 2014-12-27 DIAGNOSIS — R002 Palpitations: Secondary | ICD-10-CM | POA: Diagnosis not present

## 2014-12-27 DIAGNOSIS — E785 Hyperlipidemia, unspecified: Secondary | ICD-10-CM

## 2014-12-27 LAB — BASIC METABOLIC PANEL
BUN: 15 mg/dL (ref 6–23)
CO2: 34 mEq/L — ABNORMAL HIGH (ref 19–32)
Calcium: 9.4 mg/dL (ref 8.4–10.5)
Chloride: 91 mEq/L — ABNORMAL LOW (ref 96–112)
Creatinine, Ser: 0.86 mg/dL (ref 0.40–1.20)
GFR: 91.43 mL/min (ref >60.00)
Glucose, Bld: 199 mg/dL — ABNORMAL HIGH (ref 70–99)
Potassium: 2.6 mEq/L — CL (ref 3.5–5.1)
Sodium: 135 mEq/L (ref 135–145)

## 2014-12-27 LAB — CBC WITH DIFFERENTIAL/PLATELET
Basophils Absolute: 0 10*3/uL (ref 0.0–0.1)
Basophils Relative: 0.3 % (ref 0.0–3.0)
Eosinophils Absolute: 0.2 10*3/uL (ref 0.0–0.7)
Eosinophils Relative: 2.5 % (ref 0.0–5.0)
HCT: 42.3 % (ref 36.0–46.0)
Hemoglobin: 13.9 g/dL (ref 12.0–15.0)
Lymphocytes Relative: 42.9 % (ref 12.0–46.0)
Lymphs Abs: 3.8 10*3/uL (ref 0.7–4.0)
MCHC: 32.9 g/dL (ref 30.0–36.0)
MCV: 86 fl (ref 78.0–100.0)
Monocytes Absolute: 0.5 10*3/uL (ref 0.1–1.0)
Monocytes Relative: 5.2 % (ref 3.0–12.0)
Neutro Abs: 4.4 10*3/uL (ref 1.4–7.7)
Neutrophils Relative %: 49.1 % (ref 43.0–77.0)
Platelets: 576 10*3/uL — ABNORMAL HIGH (ref 150.0–400.0)
RBC: 4.92 Mil/uL (ref 3.87–5.11)
RDW: 13.8 % (ref 11.5–15.5)
WBC: 8.9 10*3/uL (ref 4.0–10.5)

## 2014-12-27 LAB — MICROALBUMIN / CREATININE URINE RATIO
Creatinine,U: 141 mg/dL
Microalb Creat Ratio: 2.5 mg/g (ref 0.0–30.0)
Microalb, Ur: 3.5 mg/dL — ABNORMAL HIGH (ref 0.0–1.9)

## 2014-12-27 LAB — TSH: TSH: 2.26 u[IU]/mL (ref 0.35–4.50)

## 2014-12-27 LAB — HEMOGLOBIN A1C: Hgb A1c MFr Bld: 9.4 % — ABNORMAL HIGH (ref 4.6–6.5)

## 2014-12-27 MED ORDER — SPIRONOLACTONE 50 MG PO TABS: 50.0000 mg | ORAL_TABLET | Freq: Every day | ORAL | Status: DC

## 2014-12-27 MED ORDER — GLUCOSE BLOOD VI STRP: ORAL_STRIP | Status: DC

## 2014-12-27 MED ORDER — FREESTYLE LANCETS MISC: Status: DC

## 2014-12-27 MED ORDER — FREESTYLE SYSTEM KIT: PACK | Status: DC | PRN

## 2014-12-27 NOTE — Assessment & Plan Note (Signed)
Tolerating statin. Obtain FLP.

## 2014-12-27 NOTE — Telephone Encounter (Signed)
Caller name:Steven Relation to pt: Call back [phone] Pharmacy:[facility]   Reason for call: pharmacy would like to know if they can change from free style to one touch, insurance will cover one touch.

## 2014-12-27 NOTE — Progress Notes (Signed)
Pre visit review using our clinic review tool, if applicable. No additional management support is needed unless otherwise documented below in the visit note. 

## 2014-12-27 NOTE — Assessment & Plan Note (Signed)
BP stable. Advised pt not to discontinue beta blocker.

## 2014-12-27 NOTE — Progress Notes (Signed)
Subjective:    Patient ID: Susan Whitaker, female    DOB: Oct 03, 1970, 44 y.o.   MRN: [id number]  HPI   Susan Whitaker is a 44 yr old female who presents today for follow up.  DM2- She is maintained on metformin and glucotrol.   Lab Results  Component Value Date   HGBA1C 7.0* 03/04/2014   HGBA1C 8.0* 12/03/2013   HGBA1C 7.0* 04/13/2013   Lab Results  Component Value Date   MICROALBUR 5.1* 03/04/2014   LDLCALC 121* 12/03/2013   CREATININE 0.9 03/04/2014   Hyperlipidemia-  maintained on lipitor.   HTN-Maintained on metoprolol, losartan, amlodipine, aldactone.  reports that on Saturday she had an episode of palpitations. Took the metoprolol and palpitations resolved 20 minutes later.  She had run out of metoprolol 2 days earlier.    BP Readings from Last 3 Encounters:  12/27/14 116/82  06/11/14 147/75  06/10/14 120/80     Review of Systems    see HPI  Past Medical History  Diagnosis Date  . GERD (gastroesophageal reflux disease)   . Hypertension   . Gastric ulcer   . Diabetes mellitus     type II  . Urinary incontinence   . Thrombocytopenia   . Hyperlipidemia   . Allergy     allergic rhinitis  . Hypokalemia   . H/O hemorrhoids   . H/O constipation   . FHx: hypertension   . H/O varicella   . Thyroid fullness 08/2005  . Increased BMI 06/2010  . SVT (supraventricular tachycardia)     Noted 11/2011 admission  . Primary hyperaldosteronism 02/21/2012    Social History   Social History  . Marital Status: Single    Spouse Name: N/A  . Number of Children: 2  . Years of Education: N/A   Occupational History  . 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 Smoking status: Never Smoker   . Smokeless tobacco: Never Used  . Alcohol Use: No  . Drug Use: No  . Sexual Activity: Not on file   Other Topics Concern  . Not on file   Social History Narrative   Works at [employer]          Past Surgical History  Procedure Laterality Date  .  Abdominal surgery      73 months of age-- unsure of type of surgery  . Hemorrhoid surgery  2005/2006  . Uterine fibroid embolization      Family History  Problem Relation Age of Onset  . Cancer Other     breast, lung  . Hypertension Other   . Stroke Other   . Cancer Mother     breast  . Stroke Mother   . Cancer Maternal Grandmother     breast  . Hypertension Maternal Grandmother   . Vision loss Father   . Heart disease Father     Rhythm disturbance    Allergies  Allergen Reactions  . Lisinopril     REACTION: ACE cough    Current Outpatient Prescriptions on File Prior to Visit  Medication Sig Dispense Refill  . albuterol (PROVENTIL HFA;VENTOLIN HFA) 108 (90 BASE) MCG/ACT inhaler Inhale 2 puffs into the lungs every 6 (six) hours as needed for wheezing or shortness of breath. 1 Inhaler 0  . AMBULATORY NON FORMULARY MEDICATION Place 600 mg vaginally 2 (two) times a week. Medication Name: Boric Acid Suppository 600 mg 1 pv twice weekly as directed 30 suppository 11  . amLODipine (NORVASC)  10 MG tablet Take 1 tablet (10 mg total) by mouth daily. 30 tablet 5  . atorvastatin (LIPITOR) 80 MG tablet Take 1 tablet (80 mg total) by mouth daily. 30 tablet 2  . beclomethasone (QVAR) 40 MCG/ACT inhaler Inhale 2 puffs into the lungs 2 (two) times daily. 1 Inhaler 0  . Calcium Carbonate-Vitamin D (CALTRATE 600+D) 600-400 MG-UNIT per tablet Take 1 tablet by mouth 2 (two) times daily. PT ONLY TAKES 1 A DAY.    Marland Kitchen glipiZIDE (GLUCOTROL) 5 MG tablet Take 1 tablet (5 mg total) by mouth 2 (two) times daily before a meal. 60 tablet 3  . losartan (COZAAR) 100 MG tablet Take 1 tablet (100 mg total) by mouth daily. 30 tablet 5  . metFORMIN (GLUCOPHAGE) 500 MG tablet Take 1 tablet (500 mg total) by mouth 2 (two) times daily with a meal. 180 tablet 1  . metoprolol (LOPRESSOR) 100 MG tablet TAKE ONE TABLET BY MOUTH TWICE DAILY 60 tablet 0  . Naproxen Sodium 750 MG TB24 Take 750 mg by mouth as needed. with  food    . OVER THE COUNTER MEDICATION Take 1 tablet by mouth daily. POTASSIUM    . potassium gluconate (GNP POTASSIUM GLUCONATE) 595 MG TABS tablet 2 tabs by mouth daily  0  . FeFum-FePoly-FA-B Cmp-C-Biot (INTEGRA PLUS) CAPS TAKE ONE CAPSULE BY MOUTH ONCE DAILY (Patient not taking: Reported on 12/27/2014) 30 each 3   No current facility-administered medications on file prior to visit.    BP 116/82 mmHg  Pulse 69  Temp(Src) 98.6 F (37 C) (Oral)  Ht 5\' 2"  (1.575 m)  Wt 215 lb 2 oz (97.58 kg)  BMI 39.34 kg/m2  SpO2 96%    Objective:   Physical Exam  Constitutional: She is oriented to person, place, and time. She appears well-developed and well-nourished.  HENT:  Head: Normocephalic.  Cardiovascular: Normal rate, regular rhythm and normal heart sounds.   No murmur heard. Pulmonary/Chest: Effort normal and breath sounds normal. No respiratory distress. She has no wheezes.  Musculoskeletal: She exhibits no edema.  Neurological: She is alert and oriented to person, place, and time.  Psychiatric: She has a normal mood and affect. Her behavior is normal. Judgment and thought content normal.          Assessment & Plan:

## 2014-12-27 NOTE — Patient Instructions (Signed)
Please complete lab work prior to leaving.   

## 2014-12-27 NOTE — Telephone Encounter (Signed)
Advised pharmacist- ok to change to one touch meter/strips.

## 2014-12-27 NOTE — Assessment & Plan Note (Signed)
Obtain A1C, urine microalbumin.

## 2014-12-28 MED ORDER — POTASSIUM CHLORIDE CRYS ER 20 MEQ PO TBCR
EXTENDED_RELEASE_TABLET | ORAL | Status: DC
Start: 2014-12-28 — End: 2015-10-20

## 2014-12-28 NOTE — Telephone Encounter (Signed)
Advised pt that potassium is extremely low. She has been taking otc K supplement. Advised pt to stop otc supplement start K dur as below.  She denies any cardiac symptoms presently. She lives across the street from [place] and will go pick up meds now.  Advised pt to go to there ER if she has any palpitations. She verbalizes understanding.  Advised pt to return to the lab Monday am for follow up bmet. Could you please add her to the lab schedule for 8 AM on Monday?

## 2014-12-30 ENCOUNTER — Other Ambulatory Visit: Payer: 59

## 2014-12-30 ENCOUNTER — Telehealth: Payer: Self-pay | Admitting: *Deleted

## 2014-12-30 DIAGNOSIS — E876 Hypokalemia: Secondary | ICD-10-CM

## 2014-12-30 LAB — BASIC METABOLIC PANEL
BUN: 14 mg/dL (ref 6–23)
CO2: 35 mEq/L — ABNORMAL HIGH (ref 19–32)
Calcium: 9.6 mg/dL (ref 8.4–10.5)
Chloride: 96 mEq/L (ref 96–112)
Creatinine, Ser: 0.81 mg/dL (ref 0.40–1.20)
GFR: 97.97 mL/min (ref >60.00)
Glucose, Bld: 200 mg/dL — ABNORMAL HIGH (ref 70–99)
Potassium: 3.1 mEq/L — ABNORMAL LOW (ref 3.5–5.1)
Sodium: 139 mEq/L (ref 135–145)

## 2014-12-30 NOTE — Telephone Encounter (Signed)
Lab order entered.

## 2014-12-30 NOTE — Telephone Encounter (Signed)
Notified pt and scheduled lab visit for 01/03/15 at 7:15am.  Lab order entered.

## 2014-12-30 NOTE — Telephone Encounter (Signed)
-----   Message from Debbrah Alar, NP sent at 12/30/2014 12:34 PM EDT ----- Recommend Kdur 40 meQ bid today, then once daily.  Repeat bmet on Friday AM.

## 2014-12-30 NOTE — Addendum Note (Signed)
Addended by: Tasia Catchings on: 12/30/2014 08:35 AM   Modules accepted: Orders

## 2015-01-03 ENCOUNTER — Other Ambulatory Visit: Payer: 59

## 2015-01-06 ENCOUNTER — Other Ambulatory Visit (INDEPENDENT_AMBULATORY_CARE_PROVIDER_SITE_OTHER): Payer: 59

## 2015-01-06 DIAGNOSIS — E876 Hypokalemia: Secondary | ICD-10-CM | POA: Diagnosis not present

## 2015-01-06 LAB — BASIC METABOLIC PANEL
BUN: 16 mg/dL (ref 6–23)
CO2: 33 mEq/L — ABNORMAL HIGH (ref 19–32)
Calcium: 9 mg/dL (ref 8.4–10.5)
Chloride: 96 mEq/L (ref 96–112)
Creatinine, Ser: 0.84 mg/dL (ref 0.40–1.20)
GFR: 93.94 mL/min (ref >60.00)
Glucose, Bld: 233 mg/dL — ABNORMAL HIGH (ref 70–99)
Potassium: 3.4 mEq/L — ABNORMAL LOW (ref 3.5–5.1)
Sodium: 138 mEq/L (ref 135–145)

## 2015-01-15 ENCOUNTER — Telehealth: Payer: Self-pay | Admitting: Family

## 2015-01-15 NOTE — Telephone Encounter (Signed)
pre visit letter mailed 01/15/15

## 2015-01-29 ENCOUNTER — Ambulatory Visit (INDEPENDENT_AMBULATORY_CARE_PROVIDER_SITE_OTHER): Payer: Commercial Managed Care - HMO | Admitting: Family

## 2015-01-29 ENCOUNTER — Encounter: Payer: Self-pay | Admitting: Family

## 2015-01-29 VITALS — BP 139/88 | HR 90 | Temp 98.7°F | Resp 18 | Ht 62.0 in | Wt 216.4 lb

## 2015-01-29 DIAGNOSIS — Z0000 Encounter for general adult medical examination without abnormal findings: Secondary | ICD-10-CM | POA: Diagnosis not present

## 2015-01-29 DIAGNOSIS — Z23 Encounter for immunization: Secondary | ICD-10-CM | POA: Diagnosis not present

## 2015-01-29 LAB — LIPID PANEL
Cholesterol: 211 mg/dL — ABNORMAL HIGH (ref 0–200)
HDL: 37.4 mg/dL — ABNORMAL LOW (ref >39.00)
LDL Cholesterol: 146 mg/dL — ABNORMAL HIGH (ref 0–99)
NonHDL: 174.08
Total CHOL/HDL Ratio: 6
Triglycerides: 139 mg/dL (ref 0.0–149.0)
VLDL: 27.8 mg/dL (ref 0.0–40.0)

## 2015-01-29 LAB — BASIC METABOLIC PANEL
BUN: 16 mg/dL (ref 6–23)
CO2: 32 mEq/L (ref 19–32)
Calcium: 8.9 mg/dL (ref 8.4–10.5)
Chloride: 96 mEq/L (ref 96–112)
Creatinine, Ser: 0.82 mg/dL (ref 0.40–1.20)
GFR: 96.56 mL/min (ref >60.00)
Glucose, Bld: 227 mg/dL — ABNORMAL HIGH (ref 70–99)
Potassium: 3 mEq/L — ABNORMAL LOW (ref 3.5–5.1)
Sodium: 138 mEq/L (ref 135–145)

## 2015-01-29 LAB — HEPATIC FUNCTION PANEL
ALT: 11 U/L (ref 0–35)
AST: 12 U/L (ref 0–37)
Albumin: 3.8 g/dL (ref 3.5–5.2)
Alkaline Phosphatase: 60 U/L (ref 39–117)
Bilirubin, Direct: 0 mg/dL (ref 0.0–0.3)
Total Bilirubin: 0.4 mg/dL (ref 0.2–1.2)
Total Protein: 7.9 g/dL (ref 6.0–8.3)

## 2015-01-29 MED ORDER — NYSTATIN 100000 UNIT/GM EX POWD: CUTANEOUS | Status: DC

## 2015-01-29 NOTE — Assessment & Plan Note (Signed)
Discussed continuing healthy diet, exercise, weight loss efforts.  She will schedule mammogram. Flu shot today, labs as below. Other labs up to date.

## 2015-01-29 NOTE — Patient Instructions (Addendum)
Please complete lab work prior to leaving. Keep up the good work with the healthy diet/exercise. Start keeping track of your calories using My Fitness Pal App with goal weight loss of 1 pound a week. Apply nystatin powder beneath breasts twice daily for fungal rash

## 2015-01-29 NOTE — Progress Notes (Signed)
Pre visit review using our clinic review tool, if applicable. No additional management support is needed unless otherwise documented below in the visit note. 

## 2015-01-29 NOTE — Progress Notes (Signed)
Subjective:    Patient ID: Susan Whitaker, female    DOB: 06-18-1970, 44 y.o.   MRN: [id number]  HPI  Susan Whitaker is a 44 yr old female who presents today for cpx.  Patient presents today for complete physical.  Immunizations: tetanus up to date, flu shot today Diet:has improved her diet.   Wt Readings from Last 3 Encounters:  01/29/15 216 lb 6.4 oz (98.158 kg)  12/27/14 215 lb 2 oz (97.58 kg)  06/11/14 217 lb 9.6 oz (98.703 kg)  Exercise: walking Pap Smear: 7/15- GYN, normal Mammogram: 10/15- will complete in October.  Dental: up to date Eye: exam up to date    Review of Systems  Constitutional: Negative for unexpected weight change.  HENT: Negative for hearing loss and rhinorrhea.   Eyes: Negative for visual disturbance.  Respiratory: Negative for cough.   Cardiovascular:       Rare foot swelling  Genitourinary: Negative for dysuria and frequency.       Reports irregular menses.  Musculoskeletal: Negative for myalgias and arthralgias.  Skin: Negative for rash.  Neurological: Negative for headaches.  Hematological: Negative for adenopathy.  Psychiatric/Behavioral:       Denies depression/anxiety       Past Medical History  Diagnosis Date  . GERD (gastroesophageal reflux disease)   . Hypertension   . Gastric ulcer   . Diabetes mellitus     type II  . Urinary incontinence   . Thrombocytopenia   . Hyperlipidemia   . Allergy     allergic rhinitis  . Hypokalemia   . H/O hemorrhoids   . H/O constipation   . FHx: hypertension   . H/O varicella   . Thyroid fullness 08/2005  . Increased BMI 06/2010  . SVT (supraventricular tachycardia)     Noted 11/2011 admission  . Primary hyperaldosteronism 02/21/2012    Social History   Social History  . Marital Status: Single    Spouse Name: N/A  . Number of Children: 2  . Years of Education: N/A   Occupational History  . 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 Smoking status: Never Smoker   . Smokeless  tobacco: Never Used  . Alcohol Use: No  . Drug Use: No  . Sexual Activity: Not on file   Other Topics Concern  . Not on file   Social History Narrative   Works at [employer]          Past Surgical History  Procedure Laterality Date  . Abdominal surgery      66 months of age-- unsure of type of surgery  . Hemorrhoid surgery  2005/2006  . Uterine fibroid embolization      Family History  Problem Relation Age of Onset  . Cancer Other     breast, lung  . Hypertension Other   . Stroke Other   . Cancer Mother     breast  . Stroke Mother   . Cancer Maternal Grandmother     breast  . Hypertension Maternal Grandmother   . Vision loss Father   . Heart disease Father     Rhythm disturbance    Allergies  Allergen Reactions  . Lisinopril     REACTION: ACE cough    Current Outpatient Prescriptions on File Prior to Visit  Medication Sig Dispense Refill  . albuterol (PROVENTIL HFA;VENTOLIN HFA) 108 (90 BASE) MCG/ACT inhaler Inhale 2 puffs into the lungs every 6 (six) hours as needed for wheezing  or shortness of breath. 1 Inhaler 0  . AMBULATORY NON FORMULARY MEDICATION Place 600 mg vaginally 2 (two) times a week. Medication Name: Boric Acid Suppository 600 mg 1 pv twice weekly as directed 30 suppository 11  . amLODipine (NORVASC) 10 MG tablet Take 1 tablet (10 mg total) by mouth daily. 30 tablet 5  . atorvastatin (LIPITOR) 80 MG tablet Take 1 tablet (80 mg total) by mouth daily. 30 tablet 2  . beclomethasone (QVAR) 40 MCG/ACT inhaler Inhale 2 puffs into the lungs 2 (two) times daily. 1 Inhaler 0  . Calcium Carbonate-Vitamin D (CALTRATE 600+D) 600-400 MG-UNIT per tablet Take 1 tablet by mouth 2 (two) times daily. PT ONLY TAKES 1 A DAY.    Marland Kitchen FeFum-FePoly-FA-B Cmp-C-Biot (INTEGRA PLUS) CAPS TAKE ONE CAPSULE BY MOUTH ONCE DAILY (Patient not taking: Reported on 12/27/2014) 30 each 3  . glipiZIDE (GLUCOTROL) 5 MG tablet Take 1 tablet (5 mg total) by mouth 2 (two) times daily  before a meal. 60 tablet 3  . glucose blood (FREESTYLE TEST STRIPS) test strip Test as directed three times daily to check blood sugar.  DX E11.9 100 each 1  . glucose monitoring kit (FREESTYLE) monitoring kit by Does not apply route as needed for other. 1 each 0  . Lancets (FREESTYLE) lancets Use to check blood sugar once a day as instructed 100 each 1  . losartan (COZAAR) 100 MG tablet Take 1 tablet (100 mg total) by mouth daily. 30 tablet 5  . metFORMIN (GLUCOPHAGE) 500 MG tablet Take 1 tablet (500 mg total) by mouth 2 (two) times daily with a meal. 180 tablet 1  . metoprolol (LOPRESSOR) 100 MG tablet TAKE ONE TABLET BY MOUTH TWICE DAILY 60 tablet 0  . Naproxen Sodium 750 MG TB24 Take 750 mg by mouth as needed. with food    . OVER THE COUNTER MEDICATION Take 1 tablet by mouth daily. POTASSIUM    . potassium chloride SA (K-DUR,KLOR-CON) 20 MEQ tablet Take 2 tabs PO now, 2 tabs in 4 hours, the 2 tabs each morning. 60 tablet 3  . potassium gluconate (GNP POTASSIUM GLUCONATE) 595 MG TABS tablet 2 tabs by mouth daily  0  . spironolactone (ALDACTONE) 50 MG tablet Take 1 tablet (50 mg total) by mouth daily. 30 tablet 5   No current facility-administered medications on file prior to visit.    BP 139/88 mmHg  Pulse 90  Temp(Src) 98.7 F (37.1 C) (Oral)  Resp 18  Ht 5\' 2"  (1.575 m)  Wt 216 lb 6.4 oz (98.158 kg)  BMI 39.57 kg/m2  SpO2 100%  LMP 01/13/2015    Objective:   Physical Exam Physical Exam  Constitutional: She is oriented to person, place, and time. She appears well-developed and well-nourished. No distress.  HENT:  Head: Normocephalic and atraumatic.  Right Ear: Tympanic membrane and ear canal normal.  Left Ear: Tympanic membrane and ear canal normal.  Mouth/Throat: Oropharynx is clear and moist.  Eyes: Pupils are equal, round, and reactive to light. No scleral icterus.  Neck: Normal range of motion. No thyromegaly present.  Cardiovascular: Normal rate and regular rhythm.     No murmur heard. Pulmonary/Chest: Effort normal and breath sounds normal. No respiratory distress. He has no wheezes. She has no rales. She exhibits no tenderness.  Abdominal: Soft. Bowel sounds are normal. He exhibits no distension and no mass. There is no tenderness. There is no rebound and no guarding.  Musculoskeletal: She exhibits no edema.  Lymphadenopathy:  She has no cervical adenopathy.  Neurological: She is alert and oriented to person, place, and time. She has normal patellar reflexes. She exhibits normal muscle tone. Coordination normal.  Skin: Skin is warm and dry. fungal rash noted beneath both breasts Psychiatric: She has a normal mood and affect. Her behavior is normal. Judgment and thought content normal.  Breasts: Examined lying Right: Without masses, retractions, discharge or axillary adenopathy.  Left: Without masses, retractions, discharge or axillary adenopathy.        Assessment & Plan:          Assessment & Plan:

## 2015-01-30 ENCOUNTER — Telehealth: Payer: Self-pay | Admitting: Family

## 2015-01-30 DIAGNOSIS — E876 Hypokalemia: Secondary | ICD-10-CM

## 2015-01-30 DIAGNOSIS — E785 Hyperlipidemia, unspecified: Secondary | ICD-10-CM

## 2015-01-30 NOTE — Telephone Encounter (Signed)
Also, cholesterol is high.  is she taking atorvastatin every day? If so, need to d/c atorvastatin and start crestor 20mg  once daily please - then repeat flp in 6 weeks.

## 2015-01-30 NOTE — Telephone Encounter (Signed)
Potassium is low.  Is she taking K dur 47meq two tabs every morning? If so we need to increase to 2 tabs bid and repeat bmet in 1 week please.

## 2015-01-31 MED ORDER — ROSUVASTATIN CALCIUM 20 MG PO TABS: 20.0000 mg | ORAL_TABLET | Freq: Every day | ORAL | Status: DC

## 2015-01-31 NOTE — Addendum Note (Signed)
Addended by: Kelle Darting A on: 01/31/2015 04:25 PM   Modules accepted: Orders

## 2015-01-31 NOTE — Telephone Encounter (Signed)
Notified pt. She states she has only been taking 1 potassium every morning. Advised pt to take 2 tablets every morning. Lab appt scheduled for 02/07/15 at 7:15am.  Reports compliance with atorvastatin. She will start Crestor and recheck on 03/14/15 at 7:15am.  Future orders entered.

## 2015-01-31 NOTE — Telephone Encounter (Signed)
Left message for pt to return my call.

## 2015-02-07 ENCOUNTER — Other Ambulatory Visit: Payer: Commercial Managed Care - HMO

## 2015-03-14 ENCOUNTER — Other Ambulatory Visit: Payer: Self-pay | Admitting: Family

## 2015-03-14 ENCOUNTER — Other Ambulatory Visit: Payer: Commercial Managed Care - HMO

## 2015-03-18 ENCOUNTER — Other Ambulatory Visit: Payer: Self-pay | Admitting: Family

## 2015-05-15 ENCOUNTER — Other Ambulatory Visit: Payer: Self-pay

## 2015-05-15 DIAGNOSIS — Z1231 Encounter for screening mammogram for malignant neoplasm of breast: Secondary | ICD-10-CM

## 2015-05-16 ENCOUNTER — Ambulatory Visit
Admission: RE | Admit: 2015-05-16 | Discharge: 2015-05-16 | Disposition: A | Discharge status: Home / Self Care | Payer: Commercial Managed Care - HMO | Source: Ambulatory Visit

## 2015-05-16 DIAGNOSIS — Z1231 Encounter for screening mammogram for malignant neoplasm of breast: Secondary | ICD-10-CM

## 2015-06-18 LAB — HM DIABETES EYE EXAM

## 2015-07-22 ENCOUNTER — Encounter: Payer: Self-pay | Admitting: Family

## 2015-08-02 ENCOUNTER — Other Ambulatory Visit: Payer: Self-pay | Admitting: Family

## 2015-08-05 NOTE — Telephone Encounter (Signed)
Medication filled to pharmacy as requested.  Pt has an appt this month with PCP.

## 2015-08-11 ENCOUNTER — Ambulatory Visit (INDEPENDENT_AMBULATORY_CARE_PROVIDER_SITE_OTHER): Payer: 59 | Admitting: Family

## 2015-08-11 ENCOUNTER — Encounter: Payer: Self-pay | Admitting: Family

## 2015-08-11 ENCOUNTER — Other Ambulatory Visit: Payer: 59

## 2015-08-11 VITALS — BP 128/65 | HR 73 | Temp 99.3°F | Resp 16 | Ht 62.0 in | Wt 217.0 lb

## 2015-08-11 DIAGNOSIS — E785 Hyperlipidemia, unspecified: Secondary | ICD-10-CM

## 2015-08-11 DIAGNOSIS — E118 Type 2 diabetes mellitus with unspecified complications: Secondary | ICD-10-CM

## 2015-08-11 DIAGNOSIS — R319 Hematuria, unspecified: Secondary | ICD-10-CM

## 2015-08-11 DIAGNOSIS — E1122 Type 2 diabetes mellitus with diabetic chronic kidney disease: Secondary | ICD-10-CM

## 2015-08-11 DIAGNOSIS — E1121 Type 2 diabetes mellitus with diabetic nephropathy: Secondary | ICD-10-CM | POA: Diagnosis not present

## 2015-08-11 DIAGNOSIS — I10 Essential (primary) hypertension: Secondary | ICD-10-CM | POA: Diagnosis not present

## 2015-08-11 DIAGNOSIS — IMO0002 Reserved for concepts with insufficient information to code with codable children: Secondary | ICD-10-CM

## 2015-08-11 DIAGNOSIS — R8290 Unspecified abnormal findings in urine: Secondary | ICD-10-CM

## 2015-08-11 DIAGNOSIS — E1165 Type 2 diabetes mellitus with hyperglycemia: Secondary | ICD-10-CM

## 2015-08-11 LAB — BASIC METABOLIC PANEL
BUN: 23 mg/dL (ref 6–23)
CO2: 30 mEq/L (ref 19–32)
Calcium: 9.2 mg/dL (ref 8.4–10.5)
Chloride: 98 mEq/L (ref 96–112)
Creatinine, Ser: 0.84 mg/dL (ref 0.40–1.20)
GFR: 93.7 mL/min (ref >60.00)
Glucose, Bld: 233 mg/dL — ABNORMAL HIGH (ref 70–99)
Potassium: 3.5 mEq/L (ref 3.5–5.1)
Sodium: 136 mEq/L (ref 135–145)

## 2015-08-11 LAB — LIPID PANEL
Cholesterol: 224 mg/dL — ABNORMAL HIGH (ref 0–200)
HDL: 39.2 mg/dL (ref >39.00)
LDL Cholesterol: 147 mg/dL — ABNORMAL HIGH (ref 0–99)
NonHDL: 184.37
Total CHOL/HDL Ratio: 6
Triglycerides: 188 mg/dL — ABNORMAL HIGH (ref 0.0–149.0)
VLDL: 37.6 mg/dL (ref 0.0–40.0)

## 2015-08-11 LAB — POCT URINALYSIS DIPSTICK
Bilirubin, UA: NEGATIVE
Ketones, UA: NEGATIVE
Leukocytes, UA: NEGATIVE
Nitrite, UA: NEGATIVE
Protein, UA: NEGATIVE
Spec Grav, UA: 1.02
Urobilinogen, UA: 0.2
pH, UA: 6

## 2015-08-11 LAB — HEMOGLOBIN A1C
Hgb A1c MFr Bld: 10.6 % — ABNORMAL HIGH (ref 4.6–6.5)
Hgb A1c MFr Bld: 10.4 % — ABNORMAL HIGH (ref <5.7)
Mean Plasma Glucose: 252 mg/dL

## 2015-08-11 LAB — POCT URINE PREGNANCY: Preg Test, Ur: NEGATIVE

## 2015-08-11 MED ORDER — ROSUVASTATIN CALCIUM 20 MG PO TABS: 20.0000 mg | ORAL_TABLET | Freq: Every day | ORAL | Status: DC

## 2015-08-11 MED ORDER — ASPIRIN EC 81 MG PO TBEC: 81.0000 mg | DELAYED_RELEASE_TABLET | Freq: Every day | ORAL | Status: DC

## 2015-08-11 MED ORDER — METFORMIN HCL ER 500 MG PO TB24: 500.0000 mg | ORAL_TABLET | Freq: Every day | ORAL | Status: DC

## 2015-08-11 NOTE — Patient Instructions (Addendum)
Please change metformin to Metformin XR 1000mg  once daily.   Start Crestor 20mg  once daily.   Complete lab work prior to leaving.

## 2015-08-11 NOTE — Addendum Note (Signed)
Addended by: Peggyann Shoals on: 08/11/2015 08:48 AM   Modules accepted: Orders

## 2015-08-11 NOTE — Progress Notes (Signed)
Subjective:    Patient ID: Susan Whitaker, female    DOB: 12-Dec-1970, 45 y.o.   MRN: [id number]  HPI  Susan Whitaker is a 45 yr old female who presents today for follow up of multiple medical problems:  1) HTN- Current BP meds include amlodipine,  losartan, metoprolol, aldactone.  Reports compliance with medications.   BP Readings from Last 3 Encounters:  08/11/15 153/70  01/29/15 139/88  12/27/14 116/82   2) Hyperlipidemia- on Crestor 30m.  Reports that she is not taking statin.   Lab Results  Component Value Date   CHOL 211* 01/29/2015   HDL 37.40* 01/29/2015   LDLCALC 146* 01/29/2015   LDLDIRECT 103.1 03/04/2014   TRIG 139.0 01/29/2015   CHOLHDL 6 01/29/2015   3) DM2- on metformin and glucotrol.  She reports that she is not checking sugars because meter is broken.  Reports that she is not regularly taking metformin due to diarrhea as a side effect.   Lab Results  Component Value Date   HGBA1C 9.4* 12/27/2014   HGBA1C 7.0* 03/04/2014   HGBA1C 8.0* 12/03/2013   Lab Results  Component Value Date   MICROALBUR 3.5* 12/27/2014   LDLCALC 146* 01/29/2015   CREATININE 0.82 01/29/2015   4) Amenorrhea- LMP 2/17, no cycle in March.   Review of Systems  Cardiovascular: Negative for leg swelling.   Past Medical History  Diagnosis Date  . GERD (gastroesophageal reflux disease)   . Hypertension   . Gastric ulcer   . Diabetes mellitus     type II  . Urinary incontinence   . [place]ocytopenia ([place]   . Hyperlipidemia   . Allergy     allergic rhinitis  . Hypokalemia   . H/O hemorrhoids   . H/O constipation   . FHx: hypertension   . H/O varicella   . Thyroid fullness 08/2005  . Increased BMI 06/2010  . SVT (supra[place]ricular tachycardia) ([facility]     Noted 11/2011 admission  . [place] 02/21/2012    Social History   Social History  . Marital Status: Single    Spouse Name: N/A  . Number of Children: 2  . Years of Education: N/A   Occupational  History  . 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 Smoking status: Never Smoker   . Smokeless tobacco: Never Used  . Alcohol Use: No  . Drug Use: No  . Sexual Activity: Not on file   Other Topics Concern  . Not on file   Social History Narrative   Works at [employer]         Past Surgical History  Procedure Laterality Date  . Abdominal surgery      622months of age-- unsure of type of surgery  . Hemorrhoid surgery  2005/2006  . Uterine fibroid embolization      Family History  Problem Relation Age of Onset  . Cancer Other     breast, lung  . Hypertension Other   . Stroke Other   . Cancer Mother     breast  . Stroke Mother   . Cancer Maternal Grandmother     breast  . Hypertension Maternal Grandmother   . Vision loss Father   . Heart disease Father     Rhythm disturbance    Allergies  Allergen Reactions  . Lisinopril     REACTION: ACE cough    Current Outpatient Prescriptions on File Prior to Visit  Medication Sig Dispense  Refill  . albuterol (PROVENTIL HFA;VENTOLIN HFA) 108 (90 BASE) MCG/ACT inhaler Inhale 2 puffs into the lungs every 6 (six) hours as needed for wheezing or shortness of breath. 1 Inhaler 0  . AMBULATORY NON FORMULARY MEDICATION Place 600 mg vaginally 2 (two) times a week. Medication Name: Boric Acid Suppository 600 mg 1 pv twice weekly as directed 30 suppository 11  . amLODipine (NORVASC) 10 MG tablet Take 1 tablet (10 mg total) by mouth daily. 30 tablet 5  . beclomethasone (QVAR) 40 MCG/ACT inhaler Inhale 2 puffs into the lungs 2 (two) times daily. 1 Inhaler 0  . Calcium Carbonate-Vitamin D (CALTRATE 600+D) 600-400 MG-UNIT per tablet Take 1 tablet by mouth 2 (two) times daily. PT ONLY TAKES 1 A DAY.    Marland Kitchen FeFum-FePoly-FA-B Cmp-C-Biot (INTEGRA PLUS) CAPS TAKE ONE CAPSULE BY MOUTH ONCE DAILY 30 each 3  . glipiZIDE (GLUCOTROL) 5 MG tablet TAKE ONE TABLET BY MOUTH TWICE DAILY BEFORE BREAKFAST 60 tablet 1  . glucose blood  (FREESTYLE TEST STRIPS) test strip Test as directed three times daily to check blood sugar.  DX E11.9 100 each 1  . glucose monitoring kit (FREESTYLE) monitoring kit by Does not apply route as needed for other. 1 each 0  . Lancets (FREESTYLE) lancets Use to check blood sugar once a day as instructed 100 each 1  . losartan (COZAAR) 100 MG tablet Take 1 tablet (100 mg total) by mouth daily. 30 tablet 5  . metFORMIN (GLUCOPHAGE) 500 MG tablet TAKE ONE TABLET BY MOUTH TWICE DAILY WITH A  MEAL 180 tablet 0  . metoprolol (LOPRESSOR) 100 MG tablet TAKE ONE TABLET BY MOUTH TWICE DAILY 60 tablet 2  . Naproxen Sodium 750 MG TB24 Take 750 mg by mouth as needed. with food    . nystatin (MYCOSTATIN/NYSTOP) 100000 UNIT/GM POWD Apply twice daily beneath breasts. 30 g 1  . OVER THE COUNTER MEDICATION Take 1 tablet by mouth daily. POTASSIUM    . potassium chloride SA (K-DUR,KLOR-CON) 20 MEQ tablet Take 2 tabs PO now, 2 tabs in 4 hours, the 2 tabs each morning. (Patient taking differently: Take 2 tabs each morning.) 60 tablet 3  . rosuvastatin (CRESTOR) 20 MG tablet Take 1 tablet (20 mg total) by mouth daily. 30 tablet 1  . spironolactone (ALDACTONE) 50 MG tablet Take 1 tablet (50 mg total) by mouth daily. 30 tablet 5   No current facility-administered medications on file prior to visit.    BP 146/82 mmHg  Pulse 73  Temp(Src) 99.3 F (37.4 C) (Oral)  Resp 16  Ht _0  (1.575 m)  Wt 217 lb (98.431 kg)  BMI 39.68 kg/m2  SpO2 100%       Objective:   Physical Exam  Constitutional: She is oriented to person, place, and time. She appears well-developed and well-nourished.  HENT:  Head: Normocephalic and atraumatic.  Cardiovascular: Normal rate, regular rhythm and normal heart sounds.   No murmur heard. Pulmonary/Chest: Effort normal and breath sounds normal. No respiratory distress. She has no wheezes.  Neurological: She is alert and oriented to person, place, and time.  Skin: Skin is warm and dry.    Psychiatric: She has a normal mood and affect. Her behavior is normal. Judgment and thought content normal.          Assessment & Plan:  Amenorrhea- will obtain Urine HCG, however suspect perimenopause.

## 2015-08-11 NOTE — Assessment & Plan Note (Signed)
Uncontrolled. Not taking metformin due to GI intolerance.  Will change to Metformin ER.

## 2015-08-11 NOTE — Assessment & Plan Note (Signed)
Did not start statin, advised pt to begin statin.

## 2015-08-11 NOTE — Progress Notes (Signed)
Pre visit review using our clinic review tool, if applicable. No additional management support is needed unless otherwise documented below in the visit note. 

## 2015-08-11 NOTE — Assessment & Plan Note (Signed)
Follow up bp check ok, continue current meds.

## 2015-08-12 ENCOUNTER — Telehealth: Payer: Self-pay | Admitting: Family

## 2015-08-12 DIAGNOSIS — E119 Type 2 diabetes mellitus without complications: Secondary | ICD-10-CM

## 2015-08-12 LAB — URINE CULTURE: Colony Count: 40000

## 2015-08-12 NOTE — Telephone Encounter (Signed)
Caller name: Self  Can be reached:  [phone]    Reason for call: Patient requesting a referral [facility]. [address] [place], [place] [address]  Phone: [phone]  Dr. Glade Lloyd

## 2015-08-13 ENCOUNTER — Telehealth: Payer: Self-pay | Admitting: Family

## 2015-08-13 MED ORDER — SITAGLIPTIN PHOSPHATE 100 MG PO TABS: 100.0000 mg | ORAL_TABLET | Freq: Every day | ORAL | Status: DC

## 2015-08-13 MED ORDER — GLUCOSE BLOOD VI STRP: ORAL_STRIP | Status: DC

## 2015-08-13 MED ORDER — NITROFURANTOIN MONOHYD MACRO 100 MG PO CAPS: 100.0000 mg | ORAL_CAPSULE | Freq: Two times a day (BID) | ORAL | Status: DC

## 2015-08-13 NOTE — Telephone Encounter (Signed)
UA shows blood and possible mild infection. Start macrodantin.  Repeat UA with micro in 2 weeks, dx is microscopic hematuria.

## 2015-08-13 NOTE — Telephone Encounter (Signed)
Notified pt and she voices understanding. Pt is agreeable to proceed with referral.  States she had been out of Crestor for some time prior to her last appt with [place]. Will continue 20mg  daily. Pt states she just started her cycle but will still complete macrodantin and will wait and complete urine at her follow up in 6 weeks. She requests refill of test strips; refill sent.

## 2015-08-13 NOTE — Telephone Encounter (Signed)
Left message for pt to return my call.

## 2015-08-13 NOTE — Telephone Encounter (Signed)
A1C is very uncontrolled.  I would like her to start [place] once daily. We have placed referral to endocrinology as well.

## 2015-08-13 NOTE — Telephone Encounter (Signed)
Pregnancy test is negative.  Cholesterol is elevated.  Is she taking crestor?  If not needs to start. If she is taking crestor, then we need to increase dose to 40mg .

## 2015-08-13 NOTE — Telephone Encounter (Signed)
Melissa-- pt wants to know what ranges her blood sugars should be before / after meals?

## 2015-08-14 MED ORDER — FREESTYLE LANCETS MISC: Status: DC

## 2015-08-14 NOTE — Telephone Encounter (Signed)
Pt informed of below. She also stated she only got 50 lancets when she picked up and requested refill. Filled to pharmacy as requested.

## 2015-08-14 NOTE — Telephone Encounter (Signed)
80-110 fasting, <140 2 hrs after meals.

## 2015-08-14 NOTE — Telephone Encounter (Signed)
LMOM for pt to return call for info below.

## 2015-08-14 NOTE — Addendum Note (Signed)
Addended by: Dorrene German on: 08/14/2015 04:52 PM   Modules accepted: Orders

## 2015-08-19 ENCOUNTER — Telehealth: Payer: Self-pay | Admitting: *Deleted

## 2015-08-19 DIAGNOSIS — IMO0002 Reserved for concepts with insufficient information to code with codable children: Secondary | ICD-10-CM

## 2015-08-19 DIAGNOSIS — E1165 Type 2 diabetes mellitus with hyperglycemia: Principal | ICD-10-CM

## 2015-08-19 DIAGNOSIS — E1121 Type 2 diabetes mellitus with diabetic nephropathy: Secondary | ICD-10-CM

## 2015-08-19 NOTE — Telephone Encounter (Signed)
Received call re: nutrition referral for diabetes. Was told that pt wanted [place] location and department would need to be changed to [facility]. Original referral cancelled and re-ordered as requested. They will call pt to schedule.

## 2015-09-02 ENCOUNTER — Encounter: Payer: 59 | Attending: Family

## 2015-09-02 VITALS — Ht 62.0 in | Wt 219.2 lb

## 2015-09-02 DIAGNOSIS — E118 Type 2 diabetes mellitus with unspecified complications: Secondary | ICD-10-CM

## 2015-09-02 DIAGNOSIS — E1121 Type 2 diabetes mellitus with diabetic nephropathy: Secondary | ICD-10-CM | POA: Diagnosis present

## 2015-09-02 DIAGNOSIS — E1165 Type 2 diabetes mellitus with hyperglycemia: Secondary | ICD-10-CM | POA: Insufficient documentation

## 2015-09-04 NOTE — Progress Notes (Signed)

## 2015-09-09 DIAGNOSIS — E119 Type 2 diabetes mellitus without complications: Secondary | ICD-10-CM

## 2015-09-09 DIAGNOSIS — E1121 Type 2 diabetes mellitus with diabetic nephropathy: Secondary | ICD-10-CM | POA: Diagnosis not present

## 2015-09-10 NOTE — Progress Notes (Signed)

## 2015-09-16 ENCOUNTER — Encounter: Payer: 59 | Attending: Family | Admitting: *Deleted

## 2015-09-16 ENCOUNTER — Other Ambulatory Visit: Payer: Self-pay | Admitting: Family

## 2015-09-16 ENCOUNTER — Encounter: Payer: Self-pay | Admitting: *Deleted

## 2015-09-16 DIAGNOSIS — E1121 Type 2 diabetes mellitus with diabetic nephropathy: Secondary | ICD-10-CM | POA: Insufficient documentation

## 2015-09-16 DIAGNOSIS — E1165 Type 2 diabetes mellitus with hyperglycemia: Secondary | ICD-10-CM | POA: Diagnosis present

## 2015-09-16 DIAGNOSIS — E119 Type 2 diabetes mellitus without complications: Secondary | ICD-10-CM

## 2015-09-16 NOTE — Progress Notes (Signed)
Patient was seen on 09/16/15 for the third of a series of three diabetes self-management courses at the [facility].   Catalina [place] the amount of activity recommended for healthy living . Describe activities suitable for individual needs . Identify ways to regularly incorporate activity into daily life . Identify barriers to activity and ways to over come these barriers  Identify diabetes medications being personally used and their primary action for lowering glucose and possible side effects . Describe role of stress on blood glucose and develop strategies to address psychosocial issues . Identify diabetes complications and ways to prevent them  Explain how to manage diabetes during illness . Evaluate success in meeting personal goal . Establish 2-3 goals that they will plan to diligently work on until they return for the  92-month follow-up visit  Goals:   I will be active 10 minutes or more 3 times a week  To help manage stress I will  Walk and swim at least 3 times a week  Your patient has identified these potential barriers to change:  [place] patient has identified their diabetes self-care support plan as  On-line Resources Plan:  Attend Support Group as desired

## 2015-09-17 ENCOUNTER — Ambulatory Visit: Payer: Self-pay | Admitting: Endocrinology

## 2015-09-24 ENCOUNTER — Encounter: Payer: Self-pay | Admitting: Endocrinology

## 2015-09-24 ENCOUNTER — Ambulatory Visit (INDEPENDENT_AMBULATORY_CARE_PROVIDER_SITE_OTHER): Payer: 59 | Admitting: Endocrinology

## 2015-09-24 ENCOUNTER — Ambulatory Visit: Payer: 59 | Admitting: Family

## 2015-09-24 VITALS — BP 142/84 | HR 82 | Temp 97.9°F | Resp 16 | Ht 62.0 in | Wt 219.0 lb

## 2015-09-24 DIAGNOSIS — E785 Hyperlipidemia, unspecified: Secondary | ICD-10-CM

## 2015-09-24 DIAGNOSIS — E876 Hypokalemia: Secondary | ICD-10-CM

## 2015-09-24 DIAGNOSIS — E1165 Type 2 diabetes mellitus with hyperglycemia: Secondary | ICD-10-CM | POA: Diagnosis not present

## 2015-09-24 MED ORDER — CANAGLIFLOZIN 100 MG PO TABS: ORAL_TABLET | ORAL | Status: DC

## 2015-09-24 NOTE — Patient Instructions (Addendum)
Check blood sugars on waking up 2-3  times a week Also check blood sugars about 2 hours after a meal and do this after different meals by rotation  Recommended blood sugar levels on waking up is 90-130 and about 2 hours after meal is 130-160  Please bring your blood sugar monitor to each visit, thank you  Invokana in am  Reduce Amlodipine to 1/2, Potassium to 1 tab   Stop Glipizide  Check Bp weekly   Add a protein to each meal

## 2015-09-24 NOTE — Progress Notes (Signed)
Patient ID: Susan Whitaker, female   DOB: 1970-06-16, 45 y.o.   MRN: [id number]           Reason for Appointment: Consultation for Type 2 Diabetes  Referring physician:  Sandford Craze   History of Present Illness:          Date of diagnosis of type 2 diabetes mellitus: 2013        Background history:  She was diagnosed to have diabetes when she was having fibroid surgery. A1c in 2013 was 6.9 She was treated with metformin but she says she was not able to take this because of diarrhea and would be irregular with it However her blood sugars stayed about the same until 2015 when they were higher At that time glipizide was added  Recent history:   Her blood sugars have been much higher since about late 2015 with A1c in the 9-10 range with her taking glipizide alone  Current management, blood sugar patterns and problems identified:  In 3/17 because of her intolerance to metformin she was changed to metformin ER 500 mg daily; she has tolerated this well.  She was also started on Januvia 100 mg daily in addition to her glipizide 5 mg daily  Before switching her blood sugars were mostly in the 200s and highest at home about 266  Although she is probably checking blood sugars mostly in the mornings and occasionally at suppertime she thinks they are usually around 120.  Occasionally at midday she might have a glucose of 83 with mild hypoglycemic symptoms.  Today her glucose after eating cereal at lunch was 143  She thinks that in the last couple of years she has had more stress and she thinks this was increasing her glucose readings   Non-insulin hypoglycemic drugs the patient is taking are:  Glipizide in am    Side effects from medications have been: Diarrhea from regular metformin  Compliance with the medical regimen: Fair  Glucose monitoring:  done up to 2 times a day         Glucometer: One Touch.      Blood Glucose readings by recall   PREMEAL Breakfast Lunch Dinner  Bedtime  Overall   Glucose range: 120  120 ?   Median:         Self-care: The diet that the patient has been following is: tries to limit Portions .     Typical meal intake: Breakfast is Grapes, Activia Yogurt, may have the same thing at lunch; For snacks she will have Snack box, sometimes has pasta in the evenings otherwise chicken breast at dinnertime.  Avoiding drinks which sugar                Dietician visit, most recent: None, has recently had diabetes education classes               Exercise: Walking 10 min at work and some walking the dog in the evening recently     Weight history:  Wt Readings from Last 3 Encounters:  09/24/15 219 lb (99.338 kg)  09/02/15 219 lb 3.2 oz (99.428 kg)  08/11/15 217 lb (98.431 kg)    Glycemic control:   Lab Results  Component Value Date   HGBA1C 10.4* 08/11/2015   HGBA1C 10.6* 08/11/2015   HGBA1C 9.4* 12/27/2014   Lab Results  Component Value Date   MICROALBUR 3.5* 12/27/2014   LDLCALC 147* 08/11/2015   CREATININE 0.84 08/11/2015   Lab Results  Component  Value Date   MICRALBCREAT 2.5 12/27/2014    Other active problems are in review of systems      Medication List       This list is accurate as of: 09/24/15  9:28 PM.  Always use your most recent med list.               AMBULATORY NON FORMULARY MEDICATION  Place 600 mg vaginally 2 (two) times a week. Medication Name: Boric Acid Suppository 600 mg 1 pv twice weekly as directed     amLODipine 10 MG tablet  Commonly known as:  NORVASC  Take 1 tablet (10 mg total) by mouth daily.     aspirin EC 81 MG tablet  Take 1 tablet (81 mg total) by mouth daily.     beclomethasone 40 MCG/ACT inhaler  Commonly known as:  QVAR  Inhale 2 puffs into the lungs 2 (two) times daily.     CALTRATE 600+D 600-400 MG-UNIT tablet  Generic drug:  Calcium Carbonate-Vitamin D  Take 1 tablet by mouth 2 (two) times daily. Reported on 09/24/2015     canagliflozin 100 MG Tabs tablet  Commonly  known as:  INVOKANA  1 tablet before breakfast     fluconazole 150 MG tablet  Commonly known as:  DIFLUCAN     freestyle lancets  Use to check blood sugar once a day as instructed     glipiZIDE 5 MG tablet  Commonly known as:  GLUCOTROL  TAKE ONE TABLET BY MOUTH TWICE DAILY BEFORE BREAKFAST     glucose blood test strip  Commonly known as:  FREESTYLE TEST STRIPS  Test as directed three times daily to check blood sugar.  DX E11.9     glucose monitoring kit monitoring kit  by Does not apply route as needed for other.     INTEGRA PLUS Caps  TAKE ONE CAPSULE BY MOUTH ONCE DAILY     losartan 100 MG tablet  Commonly known as:  COZAAR  Take 1 tablet (100 mg total) by mouth daily.     metFORMIN 500 MG 24 hr tablet  Commonly known as:  GLUCOPHAGE XR  Take 1 tablet (500 mg total) by mouth daily with breakfast.     metoprolol 100 MG tablet  Commonly known as:  LOPRESSOR  TAKE ONE TABLET BY MOUTH TWICE DAILY     Naproxen Sodium 750 MG Tb24  Take 750 mg by mouth as needed. with food     nystatin powder  Generic drug:  nystatin  Apply twice daily beneath breasts.     OVER THE COUNTER MEDICATION  Take 1 tablet by mouth daily. POTASSIUM     potassium chloride SA 20 MEQ tablet  Commonly known as:  K-DUR,KLOR-CON  Take 2 tabs PO now, 2 tabs in 4 hours, the 2 tabs each morning.     rosuvastatin 20 MG tablet  Commonly known as:  CRESTOR  Take 1 tablet (20 mg total) by mouth daily.     sitaGLIPtin 100 MG tablet  Commonly known as:  JANUVIA  Take 1 tablet (100 mg total) by mouth daily.     spironolactone 50 MG tablet  Commonly known as:  ALDACTONE  Take 1 tablet (50 mg total) by mouth daily.        Allergies:  Allergies  Allergen Reactions  . Lisinopril     REACTION: ACE cough    Past Medical History  Diagnosis Date  . GERD (gastroesophageal reflux disease)   . Hypertension   .  Gastric ulcer   . Diabetes mellitus     type II  . Urinary incontinence   .  Thrombocytopenia (HCC)   . Hyperlipidemia   . Allergy     allergic rhinitis  . Hypokalemia   . H/O hemorrhoids   . H/O constipation   . FHx: hypertension   . H/O varicella   . Thyroid fullness 08/2005  . Increased BMI 06/2010  . SVT (supraventricular tachycardia) (HCC)     Noted 11/2011 admission  . Primary hyperaldosteronism (HCC) 02/21/2012    Past Surgical History  Procedure Laterality Date  . Abdominal surgery      60 months of age-- unsure of type of surgery  . Hemorrhoid surgery  2005/2006  . Uterine fibroid embolization      Family History  Problem Relation Age of Onset  . Cancer Other     breast, lung  . Hypertension Other   . Stroke Other   . Cancer Mother     breast  . Stroke Mother   . Cancer Maternal Grandmother     breast  . Hypertension Maternal Grandmother   . Vision loss Father   . Heart disease Father     Rhythm disturbance  . Diabetes Neg Hx     Social History:  reports that she has never smoked. She has never used smokeless tobacco. She reports that she does not drink alcohol or use illicit drugs.    Review of Systems    Lipid history: Is not at LDL goal, is taking Crestor recently since 9/16 Has also been on other statin drugs.  Followed by PCP    Lab Results  Component Value Date   CHOL 224* 08/11/2015   HDL 39.20 08/11/2015   LDLCALC 147* 08/11/2015   LDLDIRECT 103.1 03/04/2014   TRIG 188.0* 08/11/2015   CHOLHDL 6 08/11/2015           Hypertension: Present for several years.  Also has mostly low normal or low blood potassium levels for some time and has been treated with Aldactone, losartan, amlodipine and metoprolol  Most recent eye exam was 12/16  Most recent foot exam: 5/17  Review of Systems  Constitutional: Negative for weight loss and malaise.  HENT: Negative for trouble swallowing.   Eyes: Negative for blurred vision.  Respiratory: Negative for shortness of breath.   Cardiovascular: Positive for leg swelling.        Occasional mild  leg swelling in the evening  Gastrointestinal: Negative for diarrhea.       No diarrhea since stopping regular metformin  Endocrine:       She has been noted to have a goiter with multiple nodules, stable as of 2015, 1.4 cm nodule in the right side  Genitourinary:       She has had occasional mild yeast infections treated with OTC suppositories. She does not think she has had frequent urinary tract infections or increased frequency of urination  Musculoskeletal: Negative for muscle aches and muscle cramps.  Skin: Negative for itching.  Neurological: Negative for numbness and tingling.     LABS:  Lab Results  Component Value Date   TSH 2.26 12/27/2014     No visits with results within 1 Week(s) from this visit. Latest known visit with results is:  Lab on 08/11/2015  Component Date Value Ref Range Status  . Hgb A1c MFr Bld 08/11/2015 10.4* <5.7 % Final   Comment:   For someone without known diabetes, a hemoglobin A1c value of  6.5% or greater indicates that they may have diabetes and this should be confirmed with a follow-up test.   For someone with known diabetes, a value <7% indicates that their diabetes is well controlled and a value greater than or equal to 7% indicates suboptimal control. A1c targets should be individualized based on duration of diabetes, age, comorbid conditions, and other considerations.   Currently, no consensus exists for use of hemoglobin A1c for diagnosis of diabetes for children.     . Mean Plasma Glucose 08/11/2015 252   Final    Physical Examination:  BP 142/84 mmHg  Pulse 82  Temp(Src) 97.9 F (36.6 C)  Resp 16  Ht 5\' 2"  (1.575 m)  Wt 219 lb (99.338 kg)  BMI 40.05 kg/m2  SpO2 97%  GENERAL:         Patient hasSignificant generalized obesity.   HEENT:         Eye exam shows normal external appearance. Fundus exam shows no retinopathy. Oral exam shows normal mucosa .  NECK:   There is no lymphadenopathy  Thyroid  is enlarged about 1-1/2 times on the right, relatively smooth and firm and no nodules felt.  Carotids are normal to palpation and no bruit heard LUNGS:         Chest is symmetrical. Lungs are clear to auscultation.Marland Kitchen   HEART:         Heart sounds:  S1 and S2 are normal. No murmur or click heard., no S3 or S4.   ABDOMEN:   There is no distention present. Liver and spleen are not palpable. No other mass or tenderness present.   NEUROLOGICAL:   Ankle jerks are absent bilaterally.    Diabetic Foot Exam - Simple   Simple Foot Form  Diabetic Foot exam was performed with the following findings:  Yes 09/24/2015  3:07 PM  Visual Inspection  No deformities, no ulcerations, no other skin breakdown bilaterally:  Yes  Sensation Testing  Intact to touch and monofilament testing bilaterally:  Yes  Pulse Check  Posterior Tibialis and Dorsalis pulse intact bilaterally:  Yes  Comments             Vibration sense is Mildly reduced in distal first toes. MUSCULOSKELETAL:  There is no swelling or deformity of the peripheral joints. Spine is normal to inspection.   EXTREMITIES:     There is no edema. No skin lesions present.Marland Kitchen SKIN:       No rash or lesions of concern.        ASSESSMENT:  Diabetes type 2, uncontrolled with BMI 40   She has had poorly-controlled diabetes since 2015 This is associated with significant obesity Most of her poor control was related to her difficulty tolerating regular metformin and had worsening of her control with taking glipizide alone More recently with taking glipizide and Januvia her blood sugars at home have been close to normal; most likely the 500 mg metformin ER is not very effective She does desire to lose weight Recently has been doing better with trying to walk more regularly She has cut back on portions and is generally doing well on her diet but having difficulty losing weight still  Complications of diabetes:None evident  HYPERTENSION: She is on multiple drugs  including Aldactone for treatment.  Also has history of hypokalemia indicating likely hyperaldosteronism Blood pressure reportedly is well-controlled, higher today Currently this is being managed by her PCP  HYPERLIPIDEMIA: Not at goal, on 20 mg Crestor.  Followed by PCP  PLAN:    Change glipizide to Invokana 100 mg daily.  Discussed that [facility] would be beneficial in the long run compared to glipizide.Discussed action of SGLT 2 drugs on lowering glucose by decreasing kidney absorption of glucose, benefits of weight loss and lower blood pressure, possible side effects including candidiasis and dosage regimen   She may continue her metformin but consider increasing the metformin ER to 2 tablets and if tolerated may combine this with Invokana  She will continue Januvia as this appears to be effective and co-pay card given  She will try to check more readings after meals especially supper and bring her monitor for download on each visit  Encouraged her to have more protein with her meals especially breakfast and lunch  Since she has a tendency to hypokalemia will probably benefit from Invokana and as a precaution will reduce her potassium supplements to 1 tablet for now and recheck her electrolytes in 3 weeks  To avoid hypotension she will reduce 10 mg amlodipine to half tablet  She'll try to check her blood pressure at work weekly  Patient Instructions  Check blood sugars on waking up 2-3  times a week Also check blood sugars about 2 hours after a meal and do this after different meals by rotation  Recommended blood sugar levels on waking up is 90-130 and about 2 hours after meal is 130-160  Please bring your blood sugar monitor to each visit, thank you  Invokana in am  Reduce Amlodipine to 1/2, Potassium to 1 tab   Stop Glipizide  Check Bp weekly   Add a protein to each meal    Dayane Hillenburg 09/24/2015, 9:28 PM   Note: This office note was prepared with Estate manager/land agent. Any transcriptional errors that result from this process are unintentional.

## 2015-09-29 ENCOUNTER — Telehealth: Payer: Self-pay | Admitting: Family

## 2015-09-29 ENCOUNTER — Ambulatory Visit: Payer: 59 | Admitting: Family

## 2015-09-29 DIAGNOSIS — Z0289 Encounter for other administrative examinations: Secondary | ICD-10-CM

## 2015-10-07 ENCOUNTER — Encounter: Payer: Self-pay | Admitting: Family

## 2015-10-07 NOTE — Telephone Encounter (Signed)
Marked to charge and mailing no show letter °

## 2015-10-07 NOTE — Telephone Encounter (Signed)
Pt was no show 09/29/15 for follow up appt, pt has not rescheduled, 1st no show, charge or no charge?

## 2015-10-07 NOTE — Telephone Encounter (Signed)
Yes please

## 2015-10-16 ENCOUNTER — Other Ambulatory Visit (INDEPENDENT_AMBULATORY_CARE_PROVIDER_SITE_OTHER): Payer: 59

## 2015-10-16 DIAGNOSIS — E1165 Type 2 diabetes mellitus with hyperglycemia: Secondary | ICD-10-CM | POA: Diagnosis not present

## 2015-10-16 LAB — BASIC METABOLIC PANEL
BUN: 22 mg/dL (ref 6–23)
CO2: 29 mEq/L (ref 19–32)
Calcium: 9.5 mg/dL (ref 8.4–10.5)
Chloride: 100 mEq/L (ref 96–112)
Creatinine, Ser: 0.94 mg/dL (ref 0.40–1.20)
GFR: 82.22 mL/min (ref >60.00)
Glucose, Bld: 120 mg/dL — ABNORMAL HIGH (ref 70–99)
Potassium: 3.9 mEq/L (ref 3.5–5.1)
Sodium: 135 mEq/L (ref 135–145)

## 2015-10-17 LAB — FRUCTOSAMINE: Fructosamine: 275 umol/L (ref 0–285)

## 2015-10-20 ENCOUNTER — Ambulatory Visit (INDEPENDENT_AMBULATORY_CARE_PROVIDER_SITE_OTHER): Payer: 59 | Admitting: Endocrinology

## 2015-10-20 ENCOUNTER — Other Ambulatory Visit: Payer: Self-pay | Admitting: *Deleted

## 2015-10-20 ENCOUNTER — Encounter: Payer: Self-pay | Admitting: Endocrinology

## 2015-10-20 VITALS — BP 128/80 | HR 88 | Temp 98.0°F | Resp 16 | Ht 62.0 in | Wt 219.8 lb

## 2015-10-20 DIAGNOSIS — E1165 Type 2 diabetes mellitus with hyperglycemia: Secondary | ICD-10-CM | POA: Diagnosis not present

## 2015-10-20 MED ORDER — FREESTYLE LANCETS MISC: Status: DC

## 2015-10-20 NOTE — Progress Notes (Signed)
Patient ID: Susan Whitaker, female   DOB: May 30, 1970, 45 y.o.   MRN: [id number]           Reason for Appointment: Follow-up for Type 2 Diabetes  Referring physician:  Sandford Craze   History of Present Illness:          Date of diagnosis of type 2 diabetes mellitus: 2013        Background history:  She was diagnosed to have diabetes when she was having fibroid surgery. A1c in 2013 was 6.9 She was treated with metformin but she says she was not able to take this because of diarrhea and would be irregular with it However her blood sugars stayed about the same until 2015 when they were higher and glipizide added  Recent history:   Non-insulin hypoglycemic drugs the patient is taking are:  Invokana 100 mg daily, Januvia 100 mg daily, metformin ER 500 mg daily   Her blood sugars had been much higher since about late 2015 with A1c in the 9-10 range with her taking glipizide alone This was changed to Invokana in 5/17 and Januvia continued ; Also she says she was tending to have hypoglycemic symptoms of glipizide during the day  Current management, blood sugar patterns and problems identified:  In 3/17 because of her intolerance to metformin she was changed to metformin ER 500 mg daily; she has tolerated this well.  She has had better blood sugar control with starting Invokana although she has not monitored readings consistently at home because she has had some problems with her Lantus device  No side effects with Invokana   Her weight is unchanged  Side effects from medications have been: Diarrhea from regular metformin  Compliance with the medical regimen: Fair  Glucose monitoring:   Less than once a day recently , previously done up to 2 times a day         Glucometer:  FreeStyle     Blood Glucose readings by  download  Mean values apply above for all meters except median for One Touch  PRE-MEAL Fasting Lunch Dinner Bedtime Overall  Glucose range:  52-196   150, 155 50   155   Mean/median:     143    POST-MEAL PC Breakfast PC Lunch PC Dinner  Glucose range:   160   Mean/median:      Self-care: The diet that the patient has been following is: tries to limit Portions .     Typical meal intake: Breakfast is  fruit, Activia Yogurt,  For snacks she will have Snack box, sometimes has pasta in the evenings otherwise chicken breast at dinnertime.  Avoiding  Sweet drinks                 Dietician visit, most recent: 5/17, has  had diabetes education classes               Exercise: Walking 10 min at work and some swimming recently  Weight history:  Wt Readings from Last 3 Encounters:  10/20/15 219 lb 12.8 oz (99.701 kg)  09/24/15 219 lb (99.338 kg)  09/02/15 219 lb 3.2 oz (99.428 kg)    Glycemic control:   Lab Results  Component Value Date   HGBA1C 10.4* 08/11/2015   HGBA1C 10.6* 08/11/2015   HGBA1C 9.4* 12/27/2014   Lab Results  Component Value Date   MICROALBUR 3.5* 12/27/2014   LDLCALC 147* 08/11/2015   CREATININE 0.94 10/16/2015   Lab Results  Component  Value Date   MICRALBCREAT 2.5 12/27/2014    Other active problems are in review of systems      Medication List       This list is accurate as of: 10/20/15  9:15 AM.  Always use your most recent med list.               AMBULATORY NON FORMULARY MEDICATION  Place 600 mg vaginally 2 (two) times a week. Medication Name: Boric Acid Suppository 600 mg 1 pv twice weekly as directed     amLODipine 10 MG tablet  Commonly known as:  NORVASC  Take 1 tablet (10 mg total) by mouth daily.     aspirin EC 81 MG tablet  Take 1 tablet (81 mg total) by mouth daily.     beclomethasone 40 MCG/ACT inhaler  Commonly known as:  QVAR  Inhale 2 puffs into the lungs 2 (two) times daily.     CALTRATE 600+D 600-400 MG-UNIT tablet  Generic drug:  Calcium Carbonate-Vitamin D  Take 1 tablet by mouth 2 (two) times daily. Reported on 10/20/2015     canagliflozin 100 MG Tabs tablet  Commonly known as:   INVOKANA  1 tablet before breakfast     fluconazole 150 MG tablet  Commonly known as:  DIFLUCAN  Reported on 10/20/2015     freestyle lancets  Use to check blood sugar once a day as instructed     freestyle lancets  Use as instructed to check blood sugar 3 times a day dx code E11.9     glucose blood test strip  Commonly known as:  FREESTYLE TEST STRIPS  Test as directed three times daily to check blood sugar.  DX E11.9     FREESTYLE LITE TEST VI     glucose monitoring kit monitoring kit  by Does not apply route as needed for other.     INTEGRA PLUS Caps  TAKE ONE CAPSULE BY MOUTH ONCE DAILY     losartan 100 MG tablet  Commonly known as:  COZAAR  Take 1 tablet (100 mg total) by mouth daily.     metFORMIN 500 MG 24 hr tablet  Commonly known as:  GLUCOPHAGE XR  Take 1 tablet (500 mg total) by mouth daily with breakfast.     metoprolol 100 MG tablet  Commonly known as:  LOPRESSOR  TAKE ONE TABLET BY MOUTH TWICE DAILY     Naproxen Sodium 750 MG Tb24  Take 750 mg by mouth as needed. with food     nystatin powder  Generic drug:  nystatin  Apply twice daily beneath breasts.     OVER THE COUNTER MEDICATION  Take 1 tablet by mouth daily. POTASSIUM     rosuvastatin 20 MG tablet  Commonly known as:  CRESTOR  Take 1 tablet (20 mg total) by mouth daily.     sitaGLIPtin 100 MG tablet  Commonly known as:  JANUVIA  Take 1 tablet (100 mg total) by mouth daily.     spironolactone 50 MG tablet  Commonly known as:  ALDACTONE  Take 1 tablet (50 mg total) by mouth daily.        Allergies:  Allergies  Allergen Reactions  . Lisinopril     REACTION: ACE cough    Past Medical History  Diagnosis Date  . GERD (gastroesophageal reflux disease)   . Hypertension   . Gastric ulcer   . Diabetes mellitus     type II  . Urinary incontinence   .  Thrombocytopenia (HCC)   . Hyperlipidemia   . Allergy     allergic rhinitis  . Hypokalemia   . H/O hemorrhoids   . H/O  constipation   . FHx: hypertension   . H/O varicella   . Thyroid fullness 08/2005  . Increased BMI 06/2010  . SVT (supraventricular tachycardia) (HCC)     Noted 11/2011 admission  . Primary hyperaldosteronism (HCC) 02/21/2012    Past Surgical History  Procedure Laterality Date  . Abdominal surgery      79 months of age-- unsure of type of surgery  . Hemorrhoid surgery  2005/2006  . Uterine fibroid embolization      Family History  Problem Relation Age of Onset  . Cancer Other     breast, lung  . Hypertension Other   . Stroke Other   . Cancer Mother     breast  . Stroke Mother   . Cancer Maternal Grandmother     breast  . Hypertension Maternal Grandmother   . Vision loss Father   . Heart disease Father     Rhythm disturbance  . Diabetes Neg Hx     Social History:  reports that she has never smoked. She has never used smokeless tobacco. She reports that she does not drink alcohol or use illicit drugs.    Review of Systems    Lipid history: Is not at LDL goal, is taking Crestor since 9/16 Has also been on other statin drugs.  Followed by PCP    Lab Results  Component Value Date   CHOL 224* 08/11/2015   HDL 39.20 08/11/2015   LDLCALC 147* 08/11/2015   LDLDIRECT 103.1 03/04/2014   TRIG 188.0* 08/11/2015   CHOLHDL 6 08/11/2015           Hypertension: Present for several years.  Also has mostly low normal or low blood potassium levels for some time and has been treated with Aldactone, losartan, amlodipine and metoprolol  She has not taken any potassium recently and her level is normal  Lab Results  Component Value Date   CREATININE 0.94 10/16/2015   BUN 22 10/16/2015   NA 135 10/16/2015   K 3.9 10/16/2015   CL 100 10/16/2015   CO2 29 10/16/2015     Most recent eye exam was 12/16  Most recent foot exam: 5/17  Review of Systems  She has been noted to have a goiter with multiple nodules, stable as of 2015, 1.4 cm nodule in the right side  LABS:  Lab  Results  Component Value Date   TSH 2.26 12/27/2014     Lab on 10/16/2015  Component Date Value Ref Range Status  . Sodium 10/16/2015 135  135 - 145 mEq/L Final  . Potassium 10/16/2015 3.9  3.5 - 5.1 mEq/L Final  . Chloride 10/16/2015 100  96 - 112 mEq/L Final  . CO2 10/16/2015 29  19 - 32 mEq/L Final  . Glucose, Bld 10/16/2015 120* 70 - 99 mg/dL Final  . BUN 16/02/9603 22  6 - 23 mg/dL Final  . Creatinine, Ser 10/16/2015 0.94  0.40 - 1.20 mg/dL Final  . Calcium 54/01/8118 9.5  8.4 - 10.5 mg/dL Final  . GFR 14/78/2956 82.22  >60.00 mL/min Final  . Fructosamine 10/16/2015 275  0 - 285 umol/L Final   Comment: Published reference interval for apparently healthy subjects between age 80 and 42 is 62 - 285 umol/L and in a poorly controlled diabetic population is 228 - 563 umol/L with a  mean of 396 umol/L.     Physical Examination:  BP 128/80 mmHg  Pulse 88  Temp(Src) 98 F (36.7 C)  Resp 16  Ht 5\' 2"  (1.575 m)  Wt 219 lb 12.8 oz (99.701 kg)  BMI 40.19 kg/m2  SpO2 96%      ASSESSMENT:  Diabetes type 2, uncontrolled with BMI 40   She has had poorly-controlled diabetes since 2015 This is associated with significant obesity   She appears to have improving control with switching glipizide to Invokana. However she has not lost any weight as yet.  Fructosamine indicates fairly good control overall and her recent fasting glucose is improved compared to previously high readings near 200  Complications of diabetes:None evident  HYPERTENSION: She is on multiple drugs including Aldactone for treatment.  Also has history of hypokalemia indicating likely hyperaldosteronism Blood pressure  And potassium appear to be fairly good now with adding Invokana and no potassium supplement   PLAN:     No change in medication regimen    consistent diet an exercise   Discussed prevention of vaginal candidiasis since she is taking Invokana  She will  Get a new lancet device and check her  blood sugars more consistently   A1c in 2 months  There are no Patient Instructions on file for this visit.  Mairyn Lenahan 10/20/2015, 9:15 AM   Note: This office note was prepared with Insurance underwriter. Any transcriptional errors that result from this process are unintentional.

## 2015-11-26 ENCOUNTER — Other Ambulatory Visit: Payer: Self-pay | Admitting: Family

## 2015-11-26 NOTE — Telephone Encounter (Signed)
Rx sent to the pharmacy by e-script.//AB/CMA 

## 2015-12-16 ENCOUNTER — Other Ambulatory Visit (INDEPENDENT_AMBULATORY_CARE_PROVIDER_SITE_OTHER): Payer: 59

## 2015-12-16 DIAGNOSIS — E1165 Type 2 diabetes mellitus with hyperglycemia: Secondary | ICD-10-CM | POA: Diagnosis not present

## 2015-12-16 LAB — COMPREHENSIVE METABOLIC PANEL
ALT: 11 U/L (ref 0–35)
AST: 13 U/L (ref 0–37)
Albumin: 3.9 g/dL (ref 3.5–5.2)
Alkaline Phosphatase: 53 U/L (ref 39–117)
BUN: 20 mg/dL (ref 6–23)
CO2: 28 mEq/L (ref 19–32)
Calcium: 9.2 mg/dL (ref 8.4–10.5)
Chloride: 101 mEq/L (ref 96–112)
Creatinine, Ser: 0.95 mg/dL (ref 0.40–1.20)
GFR: 81.17 mL/min (ref >60.00)
Glucose, Bld: 126 mg/dL — ABNORMAL HIGH (ref 70–99)
Potassium: 3.8 mEq/L (ref 3.5–5.1)
Sodium: 137 mEq/L (ref 135–145)
Total Bilirubin: 0.3 mg/dL (ref 0.2–1.2)
Total Protein: 8.2 g/dL (ref 6.0–8.3)

## 2015-12-16 LAB — HEMOGLOBIN A1C: Hgb A1c MFr Bld: 6.7 % — ABNORMAL HIGH (ref 4.6–6.5)

## 2015-12-19 ENCOUNTER — Ambulatory Visit: Payer: Self-pay | Admitting: Endocrinology

## 2015-12-31 ENCOUNTER — Other Ambulatory Visit: Payer: Self-pay | Admitting: Family

## 2016-01-08 ENCOUNTER — Ambulatory Visit: Payer: Self-pay | Admitting: Endocrinology

## 2016-01-12 ENCOUNTER — Encounter: Payer: Self-pay | Admitting: Endocrinology

## 2016-01-12 ENCOUNTER — Telehealth: Payer: Self-pay | Admitting: Family

## 2016-01-12 ENCOUNTER — Ambulatory Visit (INDEPENDENT_AMBULATORY_CARE_PROVIDER_SITE_OTHER): Payer: 59 | Admitting: Endocrinology

## 2016-01-12 VITALS — BP 134/78 | HR 70 | Ht 62.0 in | Wt 214.0 lb

## 2016-01-12 DIAGNOSIS — I10 Essential (primary) hypertension: Secondary | ICD-10-CM

## 2016-01-12 DIAGNOSIS — E1165 Type 2 diabetes mellitus with hyperglycemia: Secondary | ICD-10-CM

## 2016-01-12 NOTE — Progress Notes (Signed)
Patient ID: Susan Whitaker, female   DOB: 01/24/1971, 45 y.o.   MRN: [id number]           Reason for Appointment: Follow-up for Type 2 Diabetes  Referring physician:  Sandford Craze   History of Present Illness:          Date of diagnosis of type 2 diabetes mellitus: 2013        Background history:  Susan Whitaker was diagnosed to have diabetes when Susan Whitaker was having fibroid surgery. A1c in 2013 was 6.9 Susan Whitaker was treated with metformin but Susan Whitaker says Susan Whitaker was not able to take this because of diarrhea and would be irregular with it However her blood sugars stayed about the same until 2015 when they were higher and glipizide added Her blood sugars had been much higher since about late 2015 with A1c in the 9-10 range with her taking glipizide alone  Recent history:   Non-insulin hypoglycemic drugs the patient is taking are:  Invokana 100 mg daily, Januvia 100 mg daily, metformin ER 500 mg daily    Current management, blood sugar patterns and problems identified:  In 3/17 because of her intolerance to metformin Susan Whitaker was changed to metformin ER 500 mg daily; Susan Whitaker has tolerated this well.  Also no difficulties with taking Invokana  More recently has been able to check her blood sugars more consistently but did not bring her monitor for download  Her A1c has come down significantly to 6.7, previously 10.4  Susan Whitaker thinks her blood sugars are higher in the morning but not daily when Susan Whitaker wakes up  Also usually does not see high readings above 140 after meals  Susan Whitaker has has increased her walking significantly and is appearing more motivated  Her weight is improved  Side effects from medications have been: Diarrhea from regular metformin  Compliance with the medical regimen: Fair  Glucose monitoring:   Less than once a day recently , previously done up to 2 times a day         Glucometer:  FreeStyle     Blood Glucose readings by  recall:  Mean values apply above for all meters except median for One  Touch  PRE-MEAL Fasting Lunch Dinner Bedtime Overall  Glucose range: 118-160   140-170   Mean/median:         Self-care: The diet that the patient has been following is: tries to limit Portions .     Typical meal intake: Breakfast is  fruit, Activia Yogurt,  For snacks Susan Whitaker will have Snack box, sometimes has pasta in the evenings otherwise chicken breast at dinnertime.  Avoiding  Sweet drinks                 Dietician visit, most recent: 5/17, has  had diabetes education classes               Exercise: Walking 30 min at work   Weight history:  Wt Readings from Last 3 Encounters:  01/12/16 214 lb (97.1 kg)  10/20/15 219 lb 12.8 oz (99.7 kg)  09/24/15 219 lb (99.3 kg)    Glycemic control:   Lab Results  Component Value Date   HGBA1C 6.7 (H) 12/16/2015   HGBA1C 10.4 (H) 08/11/2015   HGBA1C 10.6 (H) 08/11/2015   Lab Results  Component Value Date   MICROALBUR 3.5 (H) 12/27/2014   LDLCALC 147 (H) 08/11/2015   CREATININE 0.95 12/16/2015   Lab Results  Component Value Date   MICRALBCREAT 2.5 12/27/2014  Other active problems are in review of systems      Medication List       Accurate as of 01/12/16  5:12 PM. Always use your most recent med list.          AMBULATORY NON FORMULARY MEDICATION Place 600 mg vaginally 2 (two) times a week. Medication Name: Boric Acid Suppository 600 mg 1 pv twice weekly as directed   amLODipine 10 MG tablet Commonly known as:  NORVASC TAKE ONE TABLET BY MOUTH ONCE DAILY   aspirin EC 81 MG tablet Take 1 tablet (81 mg total) by mouth daily.   beclomethasone 40 MCG/ACT inhaler Commonly known as:  QVAR Inhale 2 puffs into the lungs 2 (two) times daily.   CALTRATE 600+D 600-400 MG-UNIT tablet Generic drug:  Calcium Carbonate-Vitamin D Take 1 tablet by mouth 2 (two) times daily. Reported on 10/20/2015   fluconazole 150 MG tablet Commonly known as:  DIFLUCAN Reported on 10/20/2015   freestyle lancets Use to check blood sugar once  a day as instructed   freestyle lancets Use as instructed to check blood sugar 3 times a day dx code E11.9   glucose blood test strip Commonly known as:  FREESTYLE TEST STRIPS Test as directed three times daily to check blood sugar.  DX E11.9   FREESTYLE LITE TEST VI   glucose monitoring kit monitoring kit by Does not apply route as needed for other.   INTEGRA PLUS Caps TAKE ONE CAPSULE BY MOUTH ONCE DAILY   JANUVIA 100 MG tablet Generic drug:  sitaGLIPtin TAKE ONE TABLET BY MOUTH ONCE DAILY   losartan 100 MG tablet Commonly known as:  COZAAR TAKE ONE TABLET BY MOUTH ONCE DAILY   metFORMIN 500 MG 24 hr tablet Commonly known as:  GLUCOPHAGE XR Take 1 tablet (500 mg total) by mouth daily with breakfast.   metoprolol 100 MG tablet Commonly known as:  LOPRESSOR TAKE ONE TABLET BY MOUTH TWICE DAILY   Naproxen Sodium 750 MG Tb24 Take 750 mg by mouth as needed. with food   nystatin powder Generic drug:  nystatin Apply twice daily beneath breasts.   OVER THE COUNTER MEDICATION Take 1 tablet by mouth daily. POTASSIUM   rosuvastatin 20 MG tablet Commonly known as:  CRESTOR Take 1 tablet (20 mg total) by mouth daily.   spironolactone 50 MG tablet Commonly known as:  ALDACTONE TAKE ONE TABLET BY MOUTH ONCE DAILY       Allergies:  Allergies  Allergen Reactions  . Lisinopril     REACTION: ACE cough    Past Medical History:  Diagnosis Date  . Allergy    allergic rhinitis  . Diabetes mellitus    type II  . FHx: hypertension   . Gastric ulcer   . GERD (gastroesophageal reflux disease)   . H/O constipation   . H/O hemorrhoids   . H/O varicella   . Hyperlipidemia   . Hypertension   . Hypokalemia   . Increased BMI 06/2010  . Primary hyperaldosteronism (HCC) 02/21/2012  . SVT (supraventricular tachycardia) (HCC)    Noted 11/2011 admission  . Thrombocytopenia (HCC)   . Thyroid fullness 08/2005  . Urinary incontinence     Past Surgical History:  Procedure  Laterality Date  . ABDOMINAL SURGERY     66 months of age-- unsure of type of surgery  . HEMORRHOID SURGERY  2005/2006  . UTERINE FIBROID EMBOLIZATION      Family History  Problem Relation Age of Onset  . Cancer  Other     breast, lung  . Hypertension Other   . Stroke Other   . Cancer Mother     breast  . Stroke Mother   . Cancer Maternal Grandmother     breast  . Hypertension Maternal Grandmother   . Vision loss Father   . Heart disease Father     Rhythm disturbance  . Diabetes Neg Hx     Social History:  reports that Susan Whitaker has never smoked. Susan Whitaker has never used smokeless tobacco. Susan Whitaker reports that Susan Whitaker does not drink alcohol or use drugs.    Review of Systems    Lipid history: Is not at LDL goal, is taking Crestor since 9/16 Has also been on other statin drugs.  Followed by PCP    Lab Results  Component Value Date   CHOL 224 (H) 08/11/2015   HDL 39.20 08/11/2015   LDLCALC 147 (H) 08/11/2015   LDLDIRECT 103.1 03/04/2014   TRIG 188.0 (H) 08/11/2015   CHOLHDL 6 08/11/2015           Hypertension: Present for several years.   Previously has mostly low normal or low blood potassium levels for some time and has been treated with Aldactone, losartan, amlodipine and metoprolol  BP Readings from Last 3 Encounters:  01/12/16 134/78  10/20/15 128/80  09/24/15 (!) 142/84    Lab Results  Component Value Date   CREATININE 0.95 12/16/2015   BUN 20 12/16/2015   NA 137 12/16/2015   K 3.8 12/16/2015   CL 101 12/16/2015   CO2 28 12/16/2015     Most recent eye exam was 12/16  Most recent foot exam: 5/17  Review of Systems  Susan Whitaker has been noted to have a goiter with multiple nodules, stable as of 2015, 1.4 cm nodule in the right side  LABS:  Lab Results  Component Value Date   TSH 2.26 12/27/2014     No visits with results within 1 Week(s) from this visit.  Latest known visit with results is:  Lab on 12/16/2015  Component Date Value Ref Range Status  . Hgb A1c  MFr Bld 12/16/2015 6.7* 4.6 - 6.5 % Final  . Sodium 12/16/2015 137  135 - 145 mEq/L Final  . Potassium 12/16/2015 3.8  3.5 - 5.1 mEq/L Final  . Chloride 12/16/2015 101  96 - 112 mEq/L Final  . CO2 12/16/2015 28  19 - 32 mEq/L Final  . Glucose, Bld 12/16/2015 126* 70 - 99 mg/dL Final  . BUN 16/02/9603 20  6 - 23 mg/dL Final  . Creatinine, Ser 12/16/2015 0.95  0.40 - 1.20 mg/dL Final  . Total Bilirubin 12/16/2015 0.3  0.2 - 1.2 mg/dL Final  . Alkaline Phosphatase 12/16/2015 53  39 - 117 U/L Final  . AST 12/16/2015 13  0 - 37 U/L Final  . ALT 12/16/2015 11  0 - 35 U/L Final  . Total Protein 12/16/2015 8.2  6.0 - 8.3 g/dL Final  . Albumin 54/01/8118 3.9  3.5 - 5.2 g/dL Final  . Calcium 14/78/2956 9.2  8.4 - 10.5 mg/dL Final  . GFR 21/30/8657 81.17  >60.00 mL/min Final    Physical Examination:  BP 134/78 (Cuff Size: Large)   Pulse 70   Ht 5\' 2"  (1.575 m)   Wt 214 lb (97.1 kg)   BMI 39.14 kg/m       ASSESSMENT:  Diabetes type 2, uncontrolled with BMI 40   See history of present illness for detailed discussion of current  diabetes management, blood sugar patterns and problems identified  Susan Whitaker is on a multidrug regimen with Januvia, Invokana and low-dose metformin ER A1c is excellent at 6.7 and Susan Whitaker is subjectively doing very well now No side effects from current regimen  Susan Whitaker has been motivated, is usually watching her diet and exercising resulting in weight loss also  HYPERTENSION: Susan Whitaker is on multiple drugs including Aldactone for treatment which has helped regulate her potassium Blood pressure is well-controlled   PLAN:     No change in medication regimen    Continue regimen of her diet and regular exercise  May check blood sugars less often, alternate morning and postprandial readings  A1c in 3 months  There are no Patient Instructions on file for this visit.  Trinidad Ingle 01/12/2016, 5:12 PM   Note: This office note was prepared with Dragon voice recognition system  technology. Any transcriptional errors that result from this process are unintentional.

## 2016-01-13 NOTE — Telephone Encounter (Signed)
Melissa-- sent crestor refill today. Pt last saw [place] 07/2015 and sees Endocrinology today. Looks like they have ordered cholesterol testing on pt today. Please advise when pt should follow up in the office with you?

## 2016-01-14 NOTE — Telephone Encounter (Signed)
9/17 follow up please.

## 2016-01-16 NOTE — Telephone Encounter (Signed)
Please call pt to schedule follow up with Melissa as below. Thanks!

## 2016-01-16 NOTE — Telephone Encounter (Signed)
lvm for pt to call our office or schedule F/U via mychart.

## 2016-02-02 ENCOUNTER — Other Ambulatory Visit: Payer: Self-pay | Admitting: Endocrinology

## 2016-02-29 ENCOUNTER — Other Ambulatory Visit: Payer: Self-pay | Admitting: Family

## 2016-03-01 NOTE — Telephone Encounter (Signed)
Please schedule patient for follow up appt.  Thanks pc

## 2016-03-09 NOTE — Telephone Encounter (Signed)
Pt has a CPE scheduled for 03/24/16.

## 2016-03-24 ENCOUNTER — Encounter: Payer: Self-pay | Admitting: Family

## 2016-03-24 ENCOUNTER — Other Ambulatory Visit (HOSPITAL_COMMUNITY)
Admission: RE | Admit: 2016-03-24 | Discharge: 2016-03-24 | Disposition: A | Discharge status: Home / Self Care | Payer: 59 | Source: Ambulatory Visit | Attending: Family | Admitting: Family

## 2016-03-24 ENCOUNTER — Ambulatory Visit (INDEPENDENT_AMBULATORY_CARE_PROVIDER_SITE_OTHER): Payer: 59 | Admitting: Family

## 2016-03-24 ENCOUNTER — Telehealth: Payer: Self-pay | Admitting: Family

## 2016-03-24 VITALS — BP 160/80 | HR 68 | Temp 99.1°F | Resp 18 | Ht 62.0 in | Wt 214.0 lb

## 2016-03-24 DIAGNOSIS — I10 Essential (primary) hypertension: Secondary | ICD-10-CM

## 2016-03-24 DIAGNOSIS — R779 Abnormality of plasma protein, unspecified: Secondary | ICD-10-CM

## 2016-03-24 DIAGNOSIS — Z23 Encounter for immunization: Secondary | ICD-10-CM | POA: Diagnosis not present

## 2016-03-24 DIAGNOSIS — Z01419 Encounter for gynecological examination (general) (routine) without abnormal findings: Secondary | ICD-10-CM

## 2016-03-24 DIAGNOSIS — K649 Unspecified hemorrhoids: Secondary | ICD-10-CM | POA: Diagnosis not present

## 2016-03-24 DIAGNOSIS — B3731 Acute candidiasis of vulva and vagina: Secondary | ICD-10-CM

## 2016-03-24 DIAGNOSIS — E119 Type 2 diabetes mellitus without complications: Secondary | ICD-10-CM | POA: Diagnosis not present

## 2016-03-24 DIAGNOSIS — R350 Frequency of micturition: Secondary | ICD-10-CM

## 2016-03-24 DIAGNOSIS — R3129 Other microscopic hematuria: Secondary | ICD-10-CM

## 2016-03-24 DIAGNOSIS — Z1151 Encounter for screening for human papillomavirus (HPV): Secondary | ICD-10-CM | POA: Insufficient documentation

## 2016-03-24 DIAGNOSIS — Z0000 Encounter for general adult medical examination without abnormal findings: Secondary | ICD-10-CM

## 2016-03-24 DIAGNOSIS — B373 Candidiasis of vulva and vagina: Secondary | ICD-10-CM

## 2016-03-24 LAB — CBC WITH DIFFERENTIAL/PLATELET
Basophils Absolute: 0 10*3/uL (ref 0.0–0.1)
Basophils Relative: 0.3 % (ref 0.0–3.0)
Eosinophils Absolute: 0.2 10*3/uL (ref 0.0–0.7)
Eosinophils Relative: 1.8 % (ref 0.0–5.0)
HCT: 42.8 % (ref 36.0–46.0)
Hemoglobin: 14.4 g/dL (ref 12.0–15.0)
Lymphocytes Relative: 32.5 % (ref 12.0–46.0)
Lymphs Abs: 3.4 10*3/uL (ref 0.7–4.0)
MCHC: 33.6 g/dL (ref 30.0–36.0)
MCV: 84 fl (ref 78.0–100.0)
Monocytes Absolute: 0.5 10*3/uL (ref 0.1–1.0)
Monocytes Relative: 4.5 % (ref 3.0–12.0)
Neutro Abs: 6.3 10*3/uL (ref 1.4–7.7)
Neutrophils Relative %: 60.9 % (ref 43.0–77.0)
Platelets: 530 10*3/uL — ABNORMAL HIGH (ref 150.0–400.0)
RBC: 5.09 Mil/uL (ref 3.87–5.11)
RDW: 14.6 % (ref 11.5–15.5)
WBC: 10.3 10*3/uL (ref 4.0–10.5)

## 2016-03-24 LAB — HEPATIC FUNCTION PANEL
ALT: 12 U/L (ref 0–35)
AST: 13 U/L (ref 0–37)
Albumin: 4.3 g/dL (ref 3.5–5.2)
Alkaline Phosphatase: 51 U/L (ref 39–117)
Bilirubin, Direct: 0 mg/dL (ref 0.0–0.3)
Total Bilirubin: 0.3 mg/dL (ref 0.2–1.2)
Total Protein: 8.7 g/dL — ABNORMAL HIGH (ref 6.0–8.3)

## 2016-03-24 LAB — BASIC METABOLIC PANEL
BUN: 15 mg/dL (ref 6–23)
CO2: 28 mEq/L (ref 19–32)
Calcium: 9.6 mg/dL (ref 8.4–10.5)
Chloride: 100 mEq/L (ref 96–112)
Creatinine, Ser: 0.88 mg/dL (ref 0.40–1.20)
GFR: 88.56 mL/min (ref >60.00)
Glucose, Bld: 107 mg/dL — ABNORMAL HIGH (ref 70–99)
Potassium: 3.8 mEq/L (ref 3.5–5.1)
Sodium: 135 mEq/L (ref 135–145)

## 2016-03-24 LAB — POCT URINALYSIS DIPSTICK
Bilirubin, UA: NEGATIVE
Ketones, UA: NEGATIVE
Leukocytes, UA: NEGATIVE
Nitrite, UA: NEGATIVE
Protein, UA: NEGATIVE
Spec Grav, UA: 1.015
Urobilinogen, UA: NEGATIVE
pH, UA: 7.5

## 2016-03-24 LAB — LIPID PANEL
Cholesterol: 246 mg/dL — ABNORMAL HIGH (ref 0–200)
HDL: 45.4 mg/dL (ref >39.00)
LDL Cholesterol: 171 mg/dL — ABNORMAL HIGH (ref 0–99)
NonHDL: 201
Total CHOL/HDL Ratio: 5
Triglycerides: 149 mg/dL (ref 0.0–149.0)
VLDL: 29.8 mg/dL (ref 0.0–40.0)

## 2016-03-24 LAB — HEMOGLOBIN A1C: Hgb A1c MFr Bld: 6.8 % — ABNORMAL HIGH (ref 4.6–6.5)

## 2016-03-24 LAB — TSH: TSH: 1.6 u[IU]/mL (ref 0.35–4.50)

## 2016-03-24 MED ORDER — NYSTATIN 100000 UNIT/GM EX POWD: CUTANEOUS | 1 refills | Status: DC

## 2016-03-24 MED ORDER — FLUCONAZOLE 150 MG PO TABS: ORAL_TABLET | ORAL | 0 refills | Status: DC

## 2016-03-24 MED ORDER — ATORVASTATIN CALCIUM 20 MG PO TABS: 20.0000 mg | ORAL_TABLET | Freq: Every day | ORAL | 3 refills | Status: DC

## 2016-03-24 MED ORDER — HYDROCORTISONE ACE-PRAMOXINE 1-1 % RE FOAM: 1.0000 | Freq: Two times a day (BID) | RECTAL | 1 refills | Status: DC

## 2016-03-24 MED ORDER — EMPAGLIFLOZIN 10 MG PO TABS: 10.0000 mg | ORAL_TABLET | Freq: Every day | ORAL | 5 refills | Status: DC

## 2016-03-24 NOTE — Patient Instructions (Addendum)
Stop invokana, begin Jardiance.  Begin diflucan for yeast infection.  Apply proctofoam to hemorrhoid twice daily, drink lots of water and eat a high fiber diet to avoid constipation. Apply nystatin powder twice daily beneath breasts. Complete lab work prior to leaving.  Return in 2 weeks for nurse visit Blood Pressure check Follow up in 3 months with Ellamay Fors.

## 2016-03-24 NOTE — Progress Notes (Signed)
Pre visit review using our clinic review tool, if applicable. No additional management support is needed unless otherwise documented below in the visit note. poct uri

## 2016-03-24 NOTE — Progress Notes (Signed)
Subjective:    Patient ID: Susan Whitaker, female    DOB: 03-Feb-1971, 45 y.o.   MRN: [id number]  HPI   Susan Whitaker is a 45 yr old female who presents today for complete physical.  Patient presents today for complete physical.  Immunizations: Up to date. Diet: eating healthy Exercise: walks regularly Pap Smear: 7/15- normal per patient- sees GYN- would like to do today Mammogram: 12/16  DM2- concerned about side effect of effexor.  Lab Results  Component Value Date   HGBA1C 6.7 (H) 12/16/2015   HGBA1C 10.4 (H) 08/11/2015   HGBA1C 10.6 (H) 08/11/2015   Lab Results  Component Value Date   MICROALBUR 3.5 (H) 12/27/2014   LDLCALC 147 (H) 08/11/2015   CREATININE 0.95 12/16/2015   HTN- maintained on losartan, metoprolol, amlodipine, aldactone BP Readings from Last 3 Encounters:  03/24/16 (!) 142/70  01/12/16 134/78  10/20/15 128/80   Dysuria- began 1 week ago. Denies hematuria or back pain.  Wt Readings from Last 3 Encounters:  03/24/16 214 lb (97.1 kg)  01/12/16 214 lb (97.1 kg)  10/20/15 219 lb 12.8 oz (99.7 kg)   + external hemorrhoid  Review of Systems  Constitutional: Negative for unexpected weight change.  HENT: Negative for hearing loss.   Eyes: Negative for visual disturbance.       Reports dry eye- seeing Dr. Juliann Pulse at [facility]  Respiratory: Negative for cough.   Cardiovascular: Negative for leg swelling.  Gastrointestinal: Negative for constipation and diarrhea.  Genitourinary: Positive for dysuria and frequency.  Musculoskeletal: Negative for arthralgias and myalgias.  Skin: Negative for rash.  Neurological: Negative for headaches.  Hematological: Negative for adenopathy.  Psychiatric/Behavioral:       Denies depression/anxiety   Past Medical History:  Diagnosis Date  . Allergy    allergic rhinitis  . Diabetes mellitus    type II  . FHx: hypertension   . Gastric ulcer   . GERD (gastroesophageal reflux disease)   . H/O constipation   . H/O  hemorrhoids   . H/O varicella   . Hyperlipidemia   . Hypertension   . Hypokalemia   . Increased BMI 06/2010  . Primary hyperaldosteronism ([facility]CC) 02/21/2012  . SVT (supraventricular tachycardia) (HCC)    Noted 11/2011 admission  . Thrombocytopenia (HCC)   . Thyroid fullness 08/2005  . Urinary incontinence      Social History   Social History  . Marital status: Single    Spouse name: N/A  . Number of children: 2  . Years of education: N/A   Occupational History  . 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 Smoking status: Never Smoker  . Smokeless tobacco: Never Used  . Alcohol use No  . Drug use: No  . Sexual activity: Not on file   Other Topics Concern  . Not on file   Social History Narrative   Works at [employer]          Past Surgical History:  Procedure Laterality Date  . ABDOMINAL SURGERY     44 months of age-- unsure of type of surgery  . HEMORRHOID SURGERY  2005/2006  . UTERINE FIBROID EMBOLIZATION      Family History  Problem Relation Age of Onset  . Cancer Mother     breast  . Stroke Mother   . Cancer Maternal Grandmother     breast  . Hypertension Maternal Grandmother   . Cancer Other     breast,  lung  . Hypertension Other   . Stroke Other   . Vision loss Father   . Heart disease Father     Rhythm disturbance  . Diabetes Neg Hx     Allergies  Allergen Reactions  . Lisinopril     REACTION: ACE cough    Current Outpatient Prescriptions on File Prior to Visit  Medication Sig Dispense Refill  . AMBULATORY NON FORMULARY MEDICATION Place 600 mg vaginally 2 (two) times a week. Medication Name: Boric Acid Suppository 600 mg 1 pv twice weekly as directed 30 suppository 11  . amLODipine (NORVASC) 10 MG tablet TAKE ONE TABLET BY MOUTH ONCE DAILY 30 tablet 5  . aspirin EC 81 MG tablet Take 1 tablet (81 mg total) by mouth daily.    . beclomethasone (QVAR) 40 MCG/ACT inhaler Inhale 2 puffs into the lungs 2 (two) times daily.  1 Inhaler 0  . Calcium Carbonate-Vitamin D (CALTRATE 600+D) 600-400 MG-UNIT per tablet Take 1 tablet by mouth 2 (two) times daily. Reported on 10/20/2015    . FeFum-FePoly-FA-B Cmp-C-Biot (INTEGRA PLUS) CAPS TAKE ONE CAPSULE BY MOUTH ONCE DAILY 30 each 3  . fluconazole (DIFLUCAN) 150 MG tablet Reported on 10/20/2015    . Glucose Blood (FREESTYLE LITE TEST VI)     . glucose blood (FREESTYLE TEST STRIPS) test strip Test as directed three times daily to check blood sugar.  DX E11.9 100 each 1  . glucose monitoring kit (FREESTYLE) monitoring kit by Does not apply route as needed for other. 1 each 0  . JANUVIA 100 MG tablet TAKE ONE TABLET BY MOUTH ONCE DAILY 30 tablet 5  . Lancets (FREESTYLE) lancets Use to check blood sugar once a day as instructed 100 each 1  . Lancets (FREESTYLE) lancets Use as instructed to check blood sugar 3 times a day dx code E11.9 100 each 12  . losartan (COZAAR) 100 MG tablet TAKE ONE TABLET BY MOUTH ONCE DAILY 30 tablet 5  . metFORMIN (GLUCOPHAGE XR) 500 MG 24 hr tablet Take 1 tablet (500 mg total) by mouth daily with breakfast. 60 tablet 5  . metoprolol (LOPRESSOR) 100 MG tablet TAKE ONE TABLET BY MOUTH TWICE DAILY 60 tablet 0  . Naproxen Sodium 750 MG TB24 Take 750 mg by mouth as needed. with food    . nystatin (MYCOSTATIN/NYSTOP) 100000 UNIT/GM POWD Apply twice daily beneath breasts. 30 g 1  . OVER THE COUNTER MEDICATION Take 1 tablet by mouth daily. POTASSIUM    . rosuvastatin (CRESTOR) 20 MG tablet TAKE ONE TABLET BY MOUTH ONCE DAILY 30 tablet 1  . spironolactone (ALDACTONE) 50 MG tablet TAKE ONE TABLET BY MOUTH ONCE DAILY 30 tablet 5   No current facility-administered medications on file prior to visit.     BP (!) 142/70 (BP Location: Right Arm, Patient Position: Sitting, Cuff Size: Normal)   Pulse 68   Temp 99.1 F (37.3 C) (Oral)   Resp 18   Ht 5\' 2"  (1.575 m)   Wt 214 lb (97.1 kg)   LMP  (Approximate) Comment: Has been cycles twice a month for the past year..  Can't remember having a cycle in October 2017.   SpO2 99% Comment: RA  BMI 39.14 kg/m       Objective:   Physical Exam  Physical Exam  Constitutional: She is oriented to person, place, and time. She appears well-developed and well-nourished. No distress.  HENT:  Head: Normocephalic and atraumatic.  Right Ear: Tympanic membrane and ear canal normal.  Left Ear: Tympanic membrane and ear canal normal.  Mouth/Throat: Oropharynx is clear and moist.  Eyes: Pupils are equal, round, and reactive to light. No scleral icterus.  Neck: Normal range of motion. No thyromegaly present.  Cardiovascular: Normal rate and regular rhythm.   No murmur heard. Pulmonary/Chest: Effort normal and breath sounds normal. No respiratory distress. He has no wheezes. She has no rales. She exhibits no tenderness.  Abdominal: Soft. Bowel sounds are normal. She exhibits no distension and no mass. There is no tenderness. There is no rebound and no guarding.  Musculoskeletal: She exhibits no edema.  Lymphadenopathy:    She has no cervical adenopathy.  Neurological: She is alert and oriented to person, place, and time. She has normal patellar reflexes. She exhibits normal muscle tone. Coordination normal.  Skin: Skin is warm and dry. intertrigo noted beneath breasts Psychiatric: She has a normal mood and affect. Her behavior is normal. Judgment and thought content normal.  Breasts: Examined lying Right: Without masses, retractions, discharge or axillary adenopathy.  Left: Without masses, retractions, discharge or axillary adenopathy.  Pelvic:  Normal uterus, normal cervix, neg adnexal tenderness/masses, white vaginal discharge          Assessment & Plan:         Assessment & Plan:  DM2- concerned re: risks associated with invokana (amputation). Stop invokana, begin Jardiance.   Vaginal candidiasis-Begin diflucan for yeast infection.   Hemorrhoid-apply proctofoam to hemorrhoid twice daily, drink lots of  water and eat a high fiber diet to avoid constipation.  Intertrigo- Apply nystatin powder twice daily beneath breasts.  HTN- uncontrolled. Advised pt to return in 2 weeks for nurse visit Blood Pressure check Follow up in 3 months with Jaycelyn Orrison.  Urinary frequency- send urine for culture.    Preventative care- pap performed today with HPV. Discussed healthy diet, exercise, weight loss. Obtain routine lab work. Flu shot today.

## 2016-03-24 NOTE — Telephone Encounter (Signed)
Cholesterol is high.  Recommend that she begin atorvastatin.  If she is sexually active she should use protection because she should not become pregnant while on atorvastatin.  Sugar is well controlled.  Her protein level is elevated. I would like her to return to the lab in 1 week to complete additional labs that I ordered please.

## 2016-03-26 ENCOUNTER — Other Ambulatory Visit: Payer: Self-pay | Admitting: *Deleted

## 2016-03-26 LAB — CYTOLOGY - PAP
Adequacy: ABSENT
Diagnosis: NEGATIVE
HPV: NOT DETECTED

## 2016-03-26 LAB — URINE CULTURE

## 2016-03-26 MED ORDER — METRONIDAZOLE 0.75 % VA GEL: VAGINAL | 0 refills | Status: DC

## 2016-03-26 NOTE — Telephone Encounter (Signed)
I spoke with the patient and she voices understanding.

## 2016-03-31 ENCOUNTER — Other Ambulatory Visit: Payer: Self-pay | Admitting: Family

## 2016-04-01 NOTE — Telephone Encounter (Signed)
Medication filled to pharmacy as requested.   

## 2016-04-06 ENCOUNTER — Encounter: Payer: Self-pay | Admitting: Family

## 2016-04-06 ENCOUNTER — Other Ambulatory Visit (INDEPENDENT_AMBULATORY_CARE_PROVIDER_SITE_OTHER): Payer: 59

## 2016-04-06 DIAGNOSIS — E1165 Type 2 diabetes mellitus with hyperglycemia: Secondary | ICD-10-CM | POA: Diagnosis not present

## 2016-04-06 LAB — COMPREHENSIVE METABOLIC PANEL
ALT: 13 U/L (ref 0–35)
AST: 13 U/L (ref 0–37)
Albumin: 4 g/dL (ref 3.5–5.2)
Alkaline Phosphatase: 59 U/L (ref 39–117)
BUN: 21 mg/dL (ref 6–23)
CO2: 30 mEq/L (ref 19–32)
Calcium: 9.3 mg/dL (ref 8.4–10.5)
Chloride: 100 mEq/L (ref 96–112)
Creatinine, Ser: 0.87 mg/dL (ref 0.40–1.20)
GFR: 89.72 mL/min (ref >60.00)
Glucose, Bld: 153 mg/dL — ABNORMAL HIGH (ref 70–99)
Potassium: 3.6 mEq/L (ref 3.5–5.1)
Sodium: 137 mEq/L (ref 135–145)
Total Bilirubin: 0.3 mg/dL (ref 0.2–1.2)
Total Protein: 8.1 g/dL (ref 6.0–8.3)

## 2016-04-06 NOTE — Telephone Encounter (Signed)
Called patient to get her to a [facility] nurse on both numbers provided but no answer. Left her a message to call back

## 2016-04-06 NOTE — Telephone Encounter (Signed)
Melissa, I scheduled [name] to come in on Monday morning at 7:00a.

## 2016-04-12 ENCOUNTER — Ambulatory Visit (INDEPENDENT_AMBULATORY_CARE_PROVIDER_SITE_OTHER): Payer: 59 | Admitting: Family

## 2016-04-12 ENCOUNTER — Encounter: Payer: Self-pay | Admitting: Family

## 2016-04-12 DIAGNOSIS — K649 Unspecified hemorrhoids: Secondary | ICD-10-CM

## 2016-04-12 DIAGNOSIS — R779 Abnormality of plasma protein, unspecified: Secondary | ICD-10-CM

## 2016-04-12 DIAGNOSIS — I10 Essential (primary) hypertension: Secondary | ICD-10-CM

## 2016-04-12 DIAGNOSIS — Z0000 Encounter for general adult medical examination without abnormal findings: Secondary | ICD-10-CM

## 2016-04-12 DIAGNOSIS — R195 Other fecal abnormalities: Secondary | ICD-10-CM | POA: Diagnosis not present

## 2016-04-12 LAB — CBC WITH DIFFERENTIAL/PLATELET
Basophils Absolute: 0 10*3/uL (ref 0.0–0.1)
Basophils Relative: 0.5 % (ref 0.0–3.0)
Eosinophils Absolute: 0.2 10*3/uL (ref 0.0–0.7)
Eosinophils Relative: 2.4 % (ref 0.0–5.0)
HCT: 40.3 % (ref 36.0–46.0)
Hemoglobin: 13.2 g/dL (ref 12.0–15.0)
Lymphocytes Relative: 36 % (ref 12.0–46.0)
Lymphs Abs: 3.1 10*3/uL (ref 0.7–4.0)
MCHC: 32.7 g/dL (ref 30.0–36.0)
MCV: 84.7 fl (ref 78.0–100.0)
Monocytes Absolute: 0.6 10*3/uL (ref 0.1–1.0)
Monocytes Relative: 6.5 % (ref 3.0–12.0)
Neutro Abs: 4.7 10*3/uL (ref 1.4–7.7)
Neutrophils Relative %: 54.6 % (ref 43.0–77.0)
Platelets: 513 10*3/uL — ABNORMAL HIGH (ref 150.0–400.0)
RBC: 4.75 Mil/uL (ref 3.87–5.11)
RDW: 15 % (ref 11.5–15.5)
WBC: 8.6 10*3/uL (ref 4.0–10.5)

## 2016-04-12 NOTE — Patient Instructions (Signed)
Please complete lab work prior to leaving. You will be contacted about your referral to GI.

## 2016-04-12 NOTE — Progress Notes (Signed)
Pre visit review using our clinic review tool, if applicable. No additional management support is needed unless otherwise documented below in the visit note. 

## 2016-04-12 NOTE — Progress Notes (Signed)
Subjective:    Patient ID: Susan Whitaker, female    DOB: 1970/12/29, 45 y.o.   MRN: [id number]  HPI  Susan Whitaker is a 45 yr old female who presents today for follow up of her blood pressure.  Last visit her BP was noted to be elevated.  Reports that she had 1 episode of bright red blood on one stool.  She denies constipation. Denies rectal pain.     BP Readings from Last 3 Encounters:  04/12/16 136/72  03/24/16 (!) 160/80  01/12/16 134/78    Review of Systems    see HPI  Past Medical History:  Diagnosis Date  . Allergy    allergic rhinitis  . Diabetes mellitus    type II  . FHx: hypertension   . Gastric ulcer   . GERD (gastroesophageal reflux disease)   . H/O constipation   . H/O hemorrhoids   . H/O varicella   . Hyperlipidemia   . Hypertension   . Hypokalemia   . Increased BMI 06/2010  . Primary hyperaldosteronism ([facility]) 02/21/2012  . SVT (supraventricular tachycardia) ([place])    Noted 11/2011 admission  . Thrombocytopenia ([facility] [place])   . Thyroid fullness 08/2005  . Urinary incontinence      Social History   Social History  . Marital status: Single    Spouse name: N/A  . Number of children: 2  . Years of education: N/A   Occupational History  . 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 Smoking status: Never Smoker  . Smokeless tobacco: Never Used  . Alcohol use No  . Drug use: No  . Sexual activity: Not on file   Other Topics Concern  . Not on file   Social History Narrative   Works at [employer]          Past Surgical History:  Procedure Laterality Date  . ABDOMINAL SURGERY     74 months of age-- unsure of type of surgery  . HEMORRHOID SURGERY  2005/2006  . UTERINE FIBROID EMBOLIZATION      Family History  Problem Relation Age of Onset  . Cancer Mother     breast  . Stroke Mother   . Cancer Maternal Grandmother     breast  . Hypertension Maternal Grandmother   . Cancer Other     breast, lung  . Hypertension Other     . Stroke Other   . Vision loss Father   . Heart disease Father     Rhythm disturbance  . Diabetes Neg Hx     Allergies  Allergen Reactions  . Lisinopril     REACTION: ACE cough    Current Outpatient Prescriptions on File Prior to Visit  Medication Sig Dispense Refill  . AMBULATORY NON FORMULARY MEDICATION Place 600 mg vaginally 2 (two) times a week. Medication Name: Boric Acid Suppository 600 mg 1 pv twice weekly as directed 30 suppository 11  . amLODipine (NORVASC) 10 MG tablet TAKE ONE TABLET BY MOUTH ONCE DAILY 30 tablet 5  . aspirin EC 81 MG tablet Take 1 tablet (81 mg total) by mouth daily.    Marland Kitchen atorvastatin (LIPITOR) 20 MG tablet Take 1 tablet (20 mg total) by mouth daily. 30 tablet 3  . beclomethasone (QVAR) 40 MCG/ACT inhaler Inhale 2 puffs into the lungs 2 (two) times daily. 1 Inhaler 0  . Calcium Carbonate-Vitamin D (CALTRATE 600+D) 600-400 MG-UNIT per tablet Take 1 tablet by mouth 2 (two) times daily.  Reported on 10/20/2015    . empagliflozin (JARDIANCE) 10 MG TABS tablet Take 10 mg by mouth daily. 30 tablet 5  . FeFum-FePoly-FA-B Cmp-C-Biot (INTEGRA PLUS) CAPS TAKE ONE CAPSULE BY MOUTH ONCE DAILY 30 each 3  . fluconazole (DIFLUCAN) 150 MG tablet One tablet by mouth today, may repeat in 3 days as needed for yeast infection 2 tablet 0  . Glucose Blood (FREESTYLE LITE TEST VI)     . glucose blood (FREESTYLE TEST STRIPS) test strip Test as directed three times daily to check blood sugar.  DX E11.9 100 each 1  . glucose monitoring kit (FREESTYLE) monitoring kit by Does not apply route as needed for other. 1 each 0  . hydrocortisone-pramoxine (PROCTOFOAM HC) rectal foam Place 1 applicator rectally 2 (two) times daily. 10 g 1  . JANUVIA 100 MG tablet TAKE ONE TABLET BY MOUTH ONCE DAILY 30 tablet 5  . Lancets (FREESTYLE) lancets Use to check blood sugar once a day as instructed 100 each 1  . Lancets (FREESTYLE) lancets Use as instructed to check blood sugar 3 times a day dx code  E11.9 100 each 12  . losartan (COZAAR) 100 MG tablet TAKE ONE TABLET BY MOUTH ONCE DAILY 30 tablet 5  . metFORMIN (GLUCOPHAGE XR) 500 MG 24 hr tablet Take 1 tablet (500 mg total) by mouth daily with breakfast. 60 tablet 5  . metoprolol (LOPRESSOR) 100 MG tablet TAKE ONE TABLET BY MOUTH TWICE DAILY 180 tablet 0  . metroNIDAZOLE (METROGEL VAGINAL) 0.75 % vaginal gel 1 application nightly x5 days. 70 g 0  . Naproxen Sodium 750 MG TB24 Take 750 mg by mouth as needed. with food    . nystatin (NYSTATIN) powder Apply twice daily beneath breasts. 30 g 1  . OVER THE COUNTER MEDICATION Take 1 tablet by mouth daily. POTASSIUM    . rosuvastatin (CRESTOR) 20 MG tablet TAKE ONE TABLET BY MOUTH ONCE DAILY 30 tablet 1  . spironolactone (ALDACTONE) 50 MG tablet TAKE ONE TABLET BY MOUTH ONCE DAILY 30 tablet 5   No current facility-administered medications on file prior to visit.     BP 136/72 (BP Location: Left Arm, Cuff Size: Large)   Pulse 85   Temp 98.4 F (36.9 C) (Oral)   Resp 16   Ht _0  (1.575 m)   Wt 215 lb 3.2 oz (97.6 kg)   LMP 02/27/2016 Comment: Has been cycles twice a month for the past year.. Can't remember having a cycle in October 2017.   SpO2 99% Comment: room air  BMI 39.36 kg/m    Objective:   Physical Exam  Constitutional: She is oriented to person, place, and time. She appears well-developed and well-nourished.  HENT:  Head: Normocephalic and atraumatic.  Cardiovascular: Normal rate, regular rhythm and normal heart sounds.   No murmur heard. Pulmonary/Chest: Effort normal and breath sounds normal. No respiratory distress. She has no wheezes.  Genitourinary:  Genitourinary Comments: ? Hemorrhoidal tissue external, + internal mass- ? Internal hemorrhoid.  Heme positive stool.   Musculoskeletal: She exhibits no edema.  Neurological: She is alert and oriented to person, place, and time.  Psychiatric: She has a normal mood and affect. Her behavior is normal. Judgment and  thought content normal.          Assessment & Plan:  Hemorrhoids/heme + stool- check CBC, refer to GI for further evaluation.  HTN- follow up BP is improved, continue current medications.   Elevated serum protein- obtain SPEP and UPEP.

## 2016-04-13 ENCOUNTER — Other Ambulatory Visit: Payer: Self-pay

## 2016-04-13 ENCOUNTER — Ambulatory Visit (INDEPENDENT_AMBULATORY_CARE_PROVIDER_SITE_OTHER): Payer: 59 | Admitting: Endocrinology

## 2016-04-13 ENCOUNTER — Encounter: Payer: Self-pay | Admitting: Endocrinology

## 2016-04-13 VITALS — BP 130/84 | HR 84 | Ht 62.0 in | Wt 217.0 lb

## 2016-04-13 DIAGNOSIS — E1165 Type 2 diabetes mellitus with hyperglycemia: Secondary | ICD-10-CM | POA: Diagnosis not present

## 2016-04-13 MED ORDER — CANAGLIFLOZIN 100 MG PO TABS: 100.0000 mg | ORAL_TABLET | Freq: Every day | ORAL | 2 refills | Status: DC

## 2016-04-13 MED ORDER — GLUCOSE BLOOD VI STRP: ORAL_STRIP | 12 refills | Status: DC

## 2016-04-13 NOTE — Progress Notes (Signed)
Patient ID: Susan Whitaker, female   DOB: Oct 18, 1970, 45 y.o.   MRN: [id number]           Reason for Appointment: Follow-up for Type 2 Diabetes  Referring physician:  Debbrah Alar   History of Present Illness:          Date of diagnosis of type 2 diabetes mellitus: 2013        Background history:  She was diagnosed to have diabetes when she was having fibroid surgery. A1c in 2013 was 6.9 She was treated with metformin but she says she was not able to take this because of diarrhea and would be irregular with it However her blood sugars stayed about the same until 2015 when they were higher and glipizide added Her blood sugars had been much higher since about late 2015 with A1c in the 9-10 range with her taking glipizide alone  Recent history:   Non-insulin hypoglycemic drugs the patient is taking are:  Jardiance 10 mg daily, Januvia 100 mg daily, metformin ER 500 mg daily   Her A1c is stable at 6.8, previously 6.7  Current management, blood sugar patterns and problems identified:  She had no difficulties with taking Invokana but because of the TV commercials regarding amputation risk she requested her PCP to change the medication to Jardiance  However her FASTING glucose appears to be higher now although she did not bring her monitor again for download  She things blood sugars are better later in the day but most likely is not doing any readings after meals  Also she is finding it too expensive to use the FreeStyle meter and is starting to use the [employer] brand now  She has tried to do some walking at work but her weight seems to be not as good as on Invokana  Usually watching diet  She can only tolerate 500 mg of metformin ER  Side effects from medications have been: Diarrhea from regular metformin  Compliance with the medical regimen: Fair  Glucose monitoring:   Less than once a day recently , previously done up to 2 times a day         Glucometer:  FreeStyle       Blood Glucose readings by  recall:   PRE-MEAL Fasting Lunch Dinner Bedtime Overall  Glucose range: 140-160  120     Mean/median:         Self-care: The diet that the patient has been following is: tries to limit Portions .     Typical meal intake: Breakfast is  fruit, Activia Yogurt,  For snacks she will have Snack box, sometimes has pasta in the evenings otherwise chicken breast at dinnertime.  Avoiding  Sweet drinks                 Dietician visit, most recent: 5/17, has  had diabetes education classes               Exercise: Walking 30 min at work   Weight history:  Wt Readings from Last 3 Encounters:  04/13/16 217 lb (98.4 kg)  04/12/16 215 lb 3.2 oz (97.6 kg)  03/24/16 214 lb (97.1 kg)    Glycemic control:   Lab Results  Component Value Date   HGBA1C 6.8 (H) 03/24/2016   HGBA1C 6.7 (H) 12/16/2015   HGBA1C 10.4 (H) 08/11/2015   Lab Results  Component Value Date   MICROALBUR 3.5 (H) 12/27/2014   LDLCALC 171 (H) 03/24/2016   CREATININE 0.87  04/06/2016   Lab Results  Component Value Date   MICRALBCREAT 2.5 12/27/2014    Other active problems are in review of systems      Medication List       Accurate as of 04/13/16  9:11 PM. Always use your most recent med list.          AMBULATORY NON FORMULARY MEDICATION Place 600 mg vaginally 2 (two) times a week. Medication Name: Boric Acid Suppository 600 mg 1 pv twice weekly as directed   amLODipine 10 MG tablet Commonly known as:  NORVASC TAKE ONE TABLET BY MOUTH ONCE DAILY   aspirin EC 81 MG tablet Take 1 tablet (81 mg total) by mouth daily.   atorvastatin 20 MG tablet Commonly known as:  LIPITOR Take 1 tablet (20 mg total) by mouth daily.   beclomethasone 40 MCG/ACT inhaler Commonly known as:  QVAR Inhale 2 puffs into the lungs 2 (two) times daily.   CALTRATE 600+D 600-400 MG-UNIT tablet Generic drug:  Calcium Carbonate-Vitamin D Take 1 tablet by mouth 2 (two) times daily. Reported on 10/20/2015    canagliflozin 100 MG Tabs tablet Commonly known as:  INVOKANA Take 1 tablet (100 mg total) by mouth daily before breakfast.   fluconazole 150 MG tablet Commonly known as:  DIFLUCAN One tablet by mouth today, may repeat in 3 days as needed for yeast infection   freestyle lancets Use to check blood sugar once a day as instructed   freestyle lancets Use as instructed to check blood sugar 3 times a day dx code E11.9   glucose blood test strip Commonly known as:  ONETOUCH VERIO Use to test blood sugar 1 time daily   glucose monitoring kit monitoring kit by Does not apply route as needed for other.   hydrocortisone-pramoxine rectal foam Commonly known as:  PROCTOFOAM HC Place 1 applicator rectally 2 (two) times daily.   INTEGRA PLUS Caps TAKE ONE CAPSULE BY MOUTH ONCE DAILY   JANUVIA 100 MG tablet Generic drug:  sitaGLIPtin TAKE ONE TABLET BY MOUTH ONCE DAILY   losartan 100 MG tablet Commonly known as:  COZAAR TAKE ONE TABLET BY MOUTH ONCE DAILY   metFORMIN 500 MG 24 hr tablet Commonly known as:  GLUCOPHAGE XR Take 1 tablet (500 mg total) by mouth daily with breakfast.   metoprolol 100 MG tablet Commonly known as:  LOPRESSOR TAKE ONE TABLET BY MOUTH TWICE DAILY   metroNIDAZOLE 0.75 % vaginal gel Commonly known as:  METROGEL VAGINAL 1 application nightly x5 days.   Naproxen Sodium 750 MG Tb24 Take 750 mg by mouth as needed. with food   nystatin powder Commonly known as:  nystatin Apply twice daily beneath breasts.   OVER THE COUNTER MEDICATION Take 1 tablet by mouth daily. POTASSIUM   rosuvastatin 20 MG tablet Commonly known as:  CRESTOR TAKE ONE TABLET BY MOUTH ONCE DAILY   spironolactone 50 MG tablet Commonly known as:  ALDACTONE TAKE ONE TABLET BY MOUTH ONCE DAILY       Allergies:  Allergies  Allergen Reactions  . Lisinopril     REACTION: ACE cough    Past Medical History:  Diagnosis Date  . Allergy    allergic rhinitis  . Diabetes  mellitus    type II  . FHx: hypertension   . Gastric ulcer   . GERD (gastroesophageal reflux disease)   . H/O constipation   . H/O hemorrhoids   . H/O varicella   . Hyperlipidemia   . Hypertension   .  Hypokalemia   . Increased BMI 06/2010  . Primary hyperaldosteronism ([place]) 02/21/2012  . SVT (supraventricular tachycardia) ([place])    Noted 11/2011 admission  . Thrombocytopenia ([place])   . Thyroid fullness 08/2005  . Urinary incontinence     Past Surgical History:  Procedure Laterality Date  . ABDOMINAL SURGERY     48 months of age-- unsure of type of surgery  . HEMORRHOID SURGERY  2005/2006  . UTERINE FIBROID EMBOLIZATION      Family History  Problem Relation Age of Onset  . Cancer Mother     breast  . Stroke Mother   . Cancer Maternal Grandmother     breast  . Hypertension Maternal Grandmother   . Cancer Other     breast, lung  . Hypertension Other   . Stroke Other   . Vision loss Father   . Heart disease Father     Rhythm disturbance  . Diabetes Neg Hx     Social History:  reports that she has never smoked. She has never used smokeless tobacco. She reports that she does not drink alcohol or use drugs.    Review of Systems    Lipid history:Recent LDL 171 She was on Crestor but recently is on Lipitor from PCP Has not had follow-up levels so far with Lipitor  Followed by PCP    Lab Results  Component Value Date   CHOL 246 (H) 03/24/2016   HDL 45.40 03/24/2016   LDLCALC 171 (H) 03/24/2016   LDLDIRECT 103.1 03/04/2014   TRIG 149.0 03/24/2016   CHOLHDL 5 03/24/2016           Hypertension: Present for several years.   Previously has mostly low normal or low blood potassium levels and has been treated with Aldactone, losartan, amlodipine and metoprolol Blood pressure upper normal today  BP Readings from Last 3 Encounters:  04/13/16 130/84  04/12/16 136/72  03/24/16 (!) 160/80    Lab Results  Component Value Date   CREATININE 0.87 04/06/2016   BUN 21  04/06/2016   NA 137 04/06/2016   K 3.6 04/06/2016   CL 100 04/06/2016   CO2 30 04/06/2016     Most recent eye exam was 12/16  Most recent foot exam: 5/17  Review of Systems  She has been noted to have a goiter with multiple nodules, stable as of 2015, Does have 1.4 cm nodule in the right side  LABS:  Lab Results  Component Value Date   TSH 1.60 03/24/2016     Office Visit on 04/12/2016  Component Date Value Ref Range Status  . WBC 04/12/2016 8.6  4.0 - 10.5 K/uL Final  . RBC 04/12/2016 4.75  3.87 - 5.11 Mil/uL Final  . Hemoglobin 04/12/2016 13.2  12.0 - 15.0 g/dL Final  . HCT 04/12/2016 40.3  36.0 - 46.0 % Final  . MCV 04/12/2016 84.7  78.0 - 100.0 fl Final  . MCHC 04/12/2016 32.7  30.0 - 36.0 g/dL Final  . RDW 04/12/2016 15.0  11.5 - 15.5 % Final  . Platelets 04/12/2016 513.0* 150.0 - 400.0 K/uL Final  . Neutrophils Relative % 04/12/2016 54.6  43.0 - 77.0 % Final  . Lymphocytes Relative 04/12/2016 36.0  12.0 - 46.0 % Final  . Monocytes Relative 04/12/2016 6.5  3.0 - 12.0 % Final  . Eosinophils Relative 04/12/2016 2.4  0.0 - 5.0 % Final  . Basophils Relative 04/12/2016 0.5  0.0 - 3.0 % Final  . Neutro Abs 04/12/2016 4.7  1.4 -  7.7 K/uL Final  . Lymphs Abs 04/12/2016 3.1  0.7 - 4.0 K/uL Final  . Monocytes Absolute 04/12/2016 0.6  0.1 - 1.0 K/uL Final  . Eosinophils Absolute 04/12/2016 0.2  0.0 - 0.7 K/uL Final  . Basophils Absolute 04/12/2016 0.0  0.0 - 0.1 K/uL Final  . Creatinine, Urine 04/13/2016 123  20 - 320 mg/dL Final  . Total Protein, Urine 04/13/2016 21  5 - 24 mg/dL Final  . Protein Creatinine Ratio 04/13/2016 171* 21 - 161 mg/g creat Final    Physical Examination:  BP 130/84   Pulse 84   Ht '5\' 2"'$  (1.575 m)   Wt 217 lb (98.4 kg)   LMP 02/27/2016 Comment: Has been cycles twice a month for the past year.. Can't remember having a cycle in October 2017.   SpO2 97%   BMI 39.69 kg/m       ASSESSMENT:  Diabetes type 2, uncontrolled with BMI 40   See  history of present illness for detailed discussion of current diabetes management, blood sugar patterns and problems identified  She is on a multidrug regimen with Januvia, Jardiance and low-dose metformin ER A1c is 6.8 last month but with switching from Invokana to [place] her blood sugars and rate appear to be going up No side effects from current regimen She does try to exercise and is fairly consistent with diet  Hyperlipidemia: Currently on Lipitor and will have follow-up with PCP   PLAN:     Since her blood sugars are not as good with Jardiance compared to [place] discussed that she should go back to Invokana and explained that the study showing risk of amputations reveals only a minimal risk and mostly with patients with pre-existing risk factors  She will try to use the One Touch Verio monitor which is apparently better covered than the freestyle and bring it on her next visit  Needs to do more readings after meals  A1c in 3 months  Patient Instructions  Check blood sugars on waking up  3x weekly  Also check blood sugars about 2 hours after a meal and do this after different meals by rotation  Recommended blood sugar levels on waking up is 90-130 and about 2 hours after meal is 130-160  Please bring your blood sugar monitor to each visit, thank you  Go back on Invokana    [facility] 04/13/2016, 9:11 PM   Note: This office note was prepared with Dragon voice recognition system technology. Any transcriptional errors that result from this process are unintentional.

## 2016-04-13 NOTE — Patient Instructions (Signed)
Check blood sugars on waking up  3x weekly  Also check blood sugars about 2 hours after a meal and do this after different meals by rotation  Recommended blood sugar levels on waking up is 90-130 and about 2 hours after meal is 130-160  Please bring your blood sugar monitor to each visit, thank you  Go back on Invokana

## 2016-04-14 LAB — PROTEIN ELECTROPHORESIS, SERUM, WITH REFLEX
Albumin ELP: 3.6 g/dL — ABNORMAL LOW (ref 3.8–4.8)
Alpha-1-Globulin: 0.3 g/dL (ref 0.2–0.3)
Alpha-2-Globulin: 0.9 g/dL (ref 0.5–0.9)
Beta 2: 0.5 g/dL (ref 0.2–0.5)
Beta Globulin: 0.5 g/dL (ref 0.4–0.6)
Gamma Globulin: 1.7 g/dL (ref 0.8–1.7)
Total Protein, Serum Electrophoresis: 7.4 g/dL (ref 6.1–8.1)

## 2016-04-14 LAB — PROTEIN ELECTROPHORESIS,RANDOM URN
Creatinine, Urine: 123 mg/dL (ref 20–320)
Protein Creatinine Ratio: 171 mg/g creat — ABNORMAL HIGH (ref 21–161)
Total Protein, Urine: 21 mg/dL (ref 5–24)

## 2016-04-16 HISTORY — PX: COLONOSCOPY: SHX174

## 2016-05-03 LAB — HM COLONOSCOPY

## 2016-05-18 ENCOUNTER — Ambulatory Visit (HOSPITAL_BASED_OUTPATIENT_CLINIC_OR_DEPARTMENT_OTHER)
Admission: RE | Admit: 2016-05-18 | Discharge: 2016-05-18 | Disposition: A | Discharge status: Home / Self Care | Payer: 59 | Source: Ambulatory Visit | Attending: Family | Admitting: Family

## 2016-05-18 DIAGNOSIS — Z1231 Encounter for screening mammogram for malignant neoplasm of breast: Secondary | ICD-10-CM | POA: Insufficient documentation

## 2016-05-18 DIAGNOSIS — Z0000 Encounter for general adult medical examination without abnormal findings: Secondary | ICD-10-CM

## 2016-06-22 DIAGNOSIS — H04123 Dry eye syndrome of bilateral lacrimal glands: Secondary | ICD-10-CM | POA: Diagnosis not present

## 2016-06-22 DIAGNOSIS — E119 Type 2 diabetes mellitus without complications: Secondary | ICD-10-CM | POA: Diagnosis not present

## 2016-06-22 DIAGNOSIS — H16223 Keratoconjunctivitis sicca, not specified as Sjogren's, bilateral: Secondary | ICD-10-CM | POA: Diagnosis not present

## 2016-06-25 ENCOUNTER — Ambulatory Visit: Payer: Self-pay | Admitting: Family

## 2016-06-26 ENCOUNTER — Other Ambulatory Visit: Payer: Self-pay | Admitting: Family

## 2016-06-28 ENCOUNTER — Ambulatory Visit: Payer: Self-pay | Admitting: Family

## 2016-07-09 ENCOUNTER — Other Ambulatory Visit (INDEPENDENT_AMBULATORY_CARE_PROVIDER_SITE_OTHER): Payer: 59

## 2016-07-09 DIAGNOSIS — E1165 Type 2 diabetes mellitus with hyperglycemia: Secondary | ICD-10-CM | POA: Diagnosis not present

## 2016-07-09 LAB — URINALYSIS, ROUTINE W REFLEX MICROSCOPIC
Bilirubin Urine: NEGATIVE
Ketones, ur: NEGATIVE
Leukocytes, UA: NEGATIVE
Nitrite: NEGATIVE
Specific Gravity, Urine: 1.015 (ref 1.000–1.030)
Total Protein, Urine: NEGATIVE
Urine Glucose: >=1000 — AB
Urobilinogen, UA: 0.2 (ref 0.0–1.0)
pH: 7 (ref 5.0–8.0)

## 2016-07-09 LAB — BASIC METABOLIC PANEL
BUN: 17 mg/dL (ref 6–23)
CO2: 27 mEq/L (ref 19–32)
Calcium: 9.4 mg/dL (ref 8.4–10.5)
Chloride: 100 mEq/L (ref 96–112)
Creatinine, Ser: 0.91 mg/dL (ref 0.40–1.20)
GFR: 85.09 mL/min (ref >60.00)
Glucose, Bld: 122 mg/dL — ABNORMAL HIGH (ref 70–99)
Potassium: 3.9 mEq/L (ref 3.5–5.1)
Sodium: 135 mEq/L (ref 135–145)

## 2016-07-09 LAB — MICROALBUMIN / CREATININE URINE RATIO
Creatinine,U: 75.5 mg/dL
Microalb Creat Ratio: 2.9 mg/g (ref 0.0–30.0)
Microalb, Ur: 2.2 mg/dL — ABNORMAL HIGH (ref 0.0–1.9)

## 2016-07-09 LAB — HEMOGLOBIN A1C: Hgb A1c MFr Bld: 7.3 % — ABNORMAL HIGH (ref 4.6–6.5)

## 2016-07-12 ENCOUNTER — Ambulatory Visit (INDEPENDENT_AMBULATORY_CARE_PROVIDER_SITE_OTHER): Payer: 59 | Admitting: Family

## 2016-07-12 ENCOUNTER — Telehealth: Payer: Self-pay | Admitting: Family

## 2016-07-12 ENCOUNTER — Encounter: Payer: Self-pay | Admitting: Family

## 2016-07-12 DIAGNOSIS — N760 Acute vaginitis: Secondary | ICD-10-CM | POA: Diagnosis not present

## 2016-07-12 DIAGNOSIS — B9689 Other specified bacterial agents as the cause of diseases classified elsewhere: Secondary | ICD-10-CM | POA: Diagnosis not present

## 2016-07-12 DIAGNOSIS — I10 Essential (primary) hypertension: Secondary | ICD-10-CM

## 2016-07-12 NOTE — Telephone Encounter (Signed)
Please contact [facility] and check dose/frequency of her boric acid vaginal suppositories and give order for 30 day supply with 6 refills.

## 2016-07-12 NOTE — Progress Notes (Signed)
Pre visit review using our clinic review tool, if applicable. No additional management support is needed unless otherwise documented below in the visit note. 

## 2016-07-12 NOTE — Progress Notes (Signed)
Subjective:    Patient ID: Susan Whitaker, female    DOB: Jun 27, 1970, 46 y.o.   MRN: [id number]  HPI  Susan Whitaker is a 46 yr old female who presents today for follow up.  1) HTN- Denies CP/SOB/Swelling. She is currently maintained on amlodipine, lopressor, aldactone.  BP Readings from Last 3 Encounters:  07/12/16 128/69  04/13/16 130/84  04/12/16 136/72   2) Hx of recurrent BV.  Has been using compounding suppositories.  One capsule twice weekly as needed.  Reports that this has been very helpful in preventing BV.    Review of Systems See HPI  Past Medical History:  Diagnosis Date  . Allergy    allergic rhinitis  . Diabetes mellitus    type II  . FHx: hypertension   . Gastric ulcer   . GERD (gastroesophageal reflux disease)   . H/O constipation   . H/O hemorrhoids   . H/O varicella   . Hyperlipidemia   . Hypertension   . Hypokalemia   . Increased BMI 06/2010  . Primary hyperaldosteronism ([place]) 02/21/2012  . SVT (supraventricular tachycardia) ([place])    Noted 11/2011 admission  . Thrombocytopenia ([place])   . Thyroid fullness 08/2005  . Urinary incontinence      Social History   Social History  . Marital status: Single    Spouse name: N/A  . Number of children: 2  . Years of education: N/A   Occupational History  . 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 Smoking status: Never Smoker  . Smokeless tobacco: Never Used  . Alcohol use No  . Drug use: No  . Sexual activity: Not on file   Other Topics Concern  . Not on file   Social History Narrative   Works at [employer]          Past Surgical History:  Procedure Laterality Date  . ABDOMINAL SURGERY     86 months of age-- unsure of type of surgery  . COLONOSCOPY  04/2016   hemorrhoids--normal per pt with Dr Collene Mares  . HEMORRHOID SURGERY  2005/2006  . UTERINE FIBROID EMBOLIZATION      Family History  Problem Relation Age of Onset  . Cancer Mother     breast  . Stroke Mother     . Cancer Maternal Grandmother     breast  . Hypertension Maternal Grandmother   . Cancer Other     breast, lung  . Hypertension Other   . Stroke Other   . Vision loss Father   . Heart disease Father     Rhythm disturbance  . Diabetes Neg Hx     Allergies  Allergen Reactions  . Lisinopril     REACTION: ACE cough    Current Outpatient Prescriptions on File Prior to Visit  Medication Sig Dispense Refill  . AMBULATORY NON FORMULARY MEDICATION Place 600 mg vaginally 2 (two) times a week. Medication Name: Boric Acid Suppository 600 mg 1 pv twice weekly as directed 30 suppository 11  . amLODipine (NORVASC) 10 MG tablet TAKE ONE TABLET BY MOUTH ONCE DAILY 30 tablet 5  . aspirin EC 81 MG tablet Take 1 tablet (81 mg total) by mouth daily.    [place] atorvastatin (LIPITOR) 20 MG tablet Take 1 tablet (20 mg total) by mouth daily. 30 tablet 3  . beclomethasone (QVAR) 40 MCG/ACT inhaler Inhale 2 puffs into the lungs 2 (two) times daily. 1 Inhaler 0  . Calcium Carbonate-Vitamin D (  CALTRATE 600+D) 600-400 MG-UNIT per tablet Take 1 tablet by mouth 2 (two) times daily. Reported on 10/20/2015    . canagliflozin (INVOKANA) 100 MG TABS tablet Take 1 tablet (100 mg total) by mouth daily before breakfast. 30 tablet 2  . FeFum-FePoly-FA-B Cmp-C-Biot (INTEGRA PLUS) CAPS TAKE ONE CAPSULE BY MOUTH ONCE DAILY 30 each 3  . glucose blood (ONETOUCH VERIO) test strip Use to test blood sugar 1 time daily 100 each 12  . glucose monitoring kit (FREESTYLE) monitoring kit by Does not apply route as needed for other. 1 each 0  . JANUVIA 100 MG tablet TAKE ONE TABLET BY MOUTH ONCE DAILY 30 tablet 5  . Lancets (FREESTYLE) lancets Use as instructed to check blood sugar 3 times a day dx code E11.9 100 each 12  . losartan (COZAAR) 100 MG tablet TAKE ONE TABLET BY MOUTH ONCE DAILY 30 tablet 5  . metFORMIN (GLUCOPHAGE XR) 500 MG 24 hr tablet Take 1 tablet (500 mg total) by mouth daily with breakfast. 60 tablet 5  . metoprolol  (LOPRESSOR) 100 MG tablet TAKE ONE TABLET BY MOUTH TWICE DAILY 180 tablet 0  . metroNIDAZOLE (METROGEL VAGINAL) 0.75 % vaginal gel 1 application nightly x5 days. 70 g 0  . Naproxen Sodium 750 MG TB24 Take 750 mg by mouth as needed. with food    . nystatin (NYSTATIN) powder Apply twice daily beneath breasts. 30 g 1  . OVER THE COUNTER MEDICATION Take 1 tablet by mouth daily. POTASSIUM    . rosuvastatin (CRESTOR) 20 MG tablet TAKE ONE TABLET BY MOUTH ONCE DAILY 30 tablet 1  . spironolactone (ALDACTONE) 50 MG tablet TAKE ONE TABLET BY MOUTH ONCE DAILY 30 tablet 5   No current facility-administered medications on file prior to visit.     BP 128/69 (BP Location: Right Arm, Cuff Size: Large)   Pulse 95   Temp 98.9 F (37.2 C) (Oral)   Resp 18   Ht '5\' 2"'$  (1.575 m)   Wt 220 lb 6.4 oz (100 kg)   LMP 06/28/2016   SpO2 98% Comment: room air  BMI 40.31 kg/m       Objective:   Physical Exam  Constitutional: She is oriented to person, place, and time. She appears well-developed and well-nourished.  HENT:  Head: Normocephalic and atraumatic.  Cardiovascular: Normal rate, regular rhythm and normal heart sounds.   No murmur heard. Pulmonary/Chest: Effort normal and breath sounds normal. No respiratory distress. She has no wheezes.  Musculoskeletal: She exhibits no edema.  Neurological: She is alert and oriented to person, place, and time.  Psychiatric: She has a normal mood and affect. Her behavior is normal. Judgment and thought content normal.          Assessment & Plan:

## 2016-07-13 MED ORDER — AMBULATORY NON FORMULARY MEDICATION: 5 refills | Status: DC

## 2016-07-13 NOTE — Telephone Encounter (Signed)
Spoke with pharmacist and verified that Rx last filled 11/2015 for boric acid capsules, 600mg . Place 1 capsule vaginally two times a week. Rx printed, signed and faxed to the pharmacy.

## 2016-07-14 ENCOUNTER — Ambulatory Visit: Payer: Self-pay | Admitting: Endocrinology

## 2016-07-15 DIAGNOSIS — B9689 Other specified bacterial agents as the cause of diseases classified elsewhere: Secondary | ICD-10-CM | POA: Insufficient documentation

## 2016-07-15 DIAGNOSIS — N760 Acute vaginitis: Secondary | ICD-10-CM

## 2016-07-15 NOTE — Assessment & Plan Note (Signed)
Stable on current medications, continue same.  

## 2016-07-15 NOTE — Assessment & Plan Note (Signed)
Recurrent, will send rx for boric acid suppositories.

## 2016-07-16 ENCOUNTER — Encounter: Payer: Self-pay | Admitting: Family

## 2016-07-16 ENCOUNTER — Other Ambulatory Visit: Payer: Self-pay | Admitting: Physician Assistant

## 2016-07-19 MED ORDER — FLUCONAZOLE 150 MG PO TABS
ORAL_TABLET | ORAL | 0 refills | Status: DC
Start: 2016-07-19 — End: 2017-04-17

## 2016-07-19 NOTE — Telephone Encounter (Signed)
I saw refill request for metrogel, I denied RX. Itching likely due to yeast rather than bacteria. If symptoms don't resolve needs OV for vaginal swab.

## 2016-07-19 NOTE — Telephone Encounter (Signed)
Please contact pt and let her know that I sent rx for diflucan to her pharmacy. She can buy otc vagisil cream to apply externally for itching. Needs OV if not improved with diflucan.

## 2016-07-23 ENCOUNTER — Ambulatory Visit: Payer: Self-pay | Admitting: Endocrinology

## 2016-07-26 ENCOUNTER — Ambulatory Visit: Payer: Self-pay | Admitting: Endocrinology

## 2016-07-28 ENCOUNTER — Encounter: Payer: Self-pay | Admitting: Family

## 2016-07-28 ENCOUNTER — Ambulatory Visit (INDEPENDENT_AMBULATORY_CARE_PROVIDER_SITE_OTHER): Payer: 59 | Admitting: Family

## 2016-07-28 VITALS — BP 148/74 | HR 87 | Temp 98.8°F | Resp 17 | Ht 62.0 in | Wt 223.8 lb

## 2016-07-28 DIAGNOSIS — K529 Noninfective gastroenteritis and colitis, unspecified: Secondary | ICD-10-CM

## 2016-07-28 NOTE — Progress Notes (Signed)
Pre visit review using our clinic review tool, if applicable. No additional management support is needed unless otherwise documented below in the visit note. 

## 2016-07-28 NOTE — Patient Instructions (Signed)
Please complete lab work prior to leaving. You may use imodium as needed for diarrhea. Make sure to drink plenty of fluids and add back in solid food slowly as tolerated. Call if new/worsening symptoms or if unable to keep down liquids. Let us know if your symptoms are not improved in 2 days.

## 2016-07-28 NOTE — Progress Notes (Signed)
Subjective:    Patient ID: [name], female    DOB: July 15, 1970, 46 y.o.   MRN: [id number]  HPI  Ms. Kump is a 46 yr old female who presents today with chief complaint of nausea/vomitting and diarrhea and abdominal pain.  Reports temp 101 yesterday.  Symptoms began on 06/29/16.  Tolerating liquids only.   Notes that she is no longer vomiting, but still having diarrhea. Overall feeling better than the last 2 days. Stomach is sore from vomiting. Yesterday she reports that she had 5 loose stools. One so far this AM, denies blood in stool.  No nausea today. Has not been checking her sugars. Daughter recently sick with similar symptoms.   Review of Systems    see HPI  Past Medical History:  Diagnosis Date  . Allergy    allergic rhinitis  . Diabetes mellitus    type II  . FHx: hypertension   . Gastric ulcer   . GERD (gastroesophageal reflux disease)   . H/O constipation   . H/O hemorrhoids   . H/O varicella   . Hyperlipidemia   . Hypertension   . Hypokalemia   . Increased BMI 06/2010  . Primary hyperaldosteronism ([place]) 02/21/2012  . SVT (supraventricular tachycardia) ([place])    Noted 11/2011 admission  . Thrombocytopenia ([place])   . Thyroid fullness 08/2005  . Urinary incontinence      Social History   Social History  . Marital status: Single    Spouse name: N/A  . Number of children: 2  . Years of education: N/A   Occupational History  . 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 Smoking status: Never Smoker  . Smokeless tobacco: Never Used  . Alcohol use No  . Drug use: No  . Sexual activity: Not on file   Other Topics Concern  . Not on file   Social History Narrative   Works at [employer]          Past Surgical History:  Procedure Laterality Date  . ABDOMINAL SURGERY     22 months of age-- unsure of type of surgery  . COLONOSCOPY  04/2016   hemorrhoids--normal per pt with Dr Collene Mares  . HEMORRHOID SURGERY  2005/2006  . UTERINE  FIBROID EMBOLIZATION      Family History  Problem Relation Age of Onset  . Cancer Mother     breast  . Stroke Mother   . Cancer Maternal Grandmother     breast  . Hypertension Maternal Grandmother   . Cancer Other     breast, lung  . Hypertension Other   . Stroke Other   . Vision loss Father   . Heart disease Father     Rhythm disturbance  . Diabetes Neg Hx     Allergies  Allergen Reactions  . Lisinopril     REACTION: ACE cough    Current Outpatient Prescriptions on File Prior to Visit  Medication Sig Dispense Refill  . AMBULATORY NON FORMULARY MEDICATION BORIC ACID CAPSULE '600mg'$ . Insert 1 capsule vaginally two times a week. 8 capsule 5  . amLODipine (NORVASC) 10 MG tablet TAKE ONE TABLET BY MOUTH ONCE DAILY 30 tablet 5  . aspirin EC 81 MG tablet Take 1 tablet (81 mg total) by mouth daily.    Marland Kitchen atorvastatin (LIPITOR) 20 MG tablet Take 1 tablet (20 mg total) by mouth daily. 30 tablet 3  . beclomethasone (QVAR) 40 MCG/ACT inhaler Inhale 2 puffs into the lungs  2 (two) times daily. 1 Inhaler 0  . Calcium Carbonate-Vitamin D (CALTRATE 600+D) 600-400 MG-UNIT per tablet Take 1 tablet by mouth 2 (two) times daily. Reported on 10/20/2015    . canagliflozin (INVOKANA) 100 MG TABS tablet Take 1 tablet (100 mg total) by mouth daily before breakfast. 30 tablet 2  . FeFum-FePoly-FA-B Cmp-C-Biot (INTEGRA PLUS) CAPS TAKE ONE CAPSULE BY MOUTH ONCE DAILY 30 each 3  . fluconazole (DIFLUCAN) 150 MG tablet 1 tablet by mouth today, may repeat in 3 days. 2 tablet 0  . glucose blood (ONETOUCH VERIO) test strip Use to test blood sugar 1 time daily 100 each 12  . glucose monitoring kit (FREESTYLE) monitoring kit by Does not apply route as needed for other. 1 each 0  . JANUVIA 100 MG tablet TAKE ONE TABLET BY MOUTH ONCE DAILY 30 tablet 5  . Lancets (FREESTYLE) lancets Use as instructed to check blood sugar 3 times a day dx code E11.9 100 each 12  . losartan (COZAAR) 100 MG tablet TAKE ONE TABLET BY  MOUTH ONCE DAILY 30 tablet 5  . metFORMIN (GLUCOPHAGE XR) 500 MG 24 hr tablet Take 1 tablet (500 mg total) by mouth daily with breakfast. 60 tablet 5  . metoprolol (LOPRESSOR) 100 MG tablet TAKE ONE TABLET BY MOUTH TWICE DAILY 180 tablet 0  . metroNIDAZOLE (METROGEL VAGINAL) 0.75 % vaginal gel 1 application nightly x5 days. 70 g 0  . Naproxen Sodium 750 MG TB24 Take 750 mg by mouth as needed. with food    . nystatin (NYSTATIN) powder Apply twice daily beneath breasts. 30 g 1  . OVER THE COUNTER MEDICATION Take 1 tablet by mouth daily. POTASSIUM    . rosuvastatin (CRESTOR) 20 MG tablet TAKE ONE TABLET BY MOUTH ONCE DAILY 30 tablet 1  . spironolactone (ALDACTONE) 50 MG tablet TAKE ONE TABLET BY MOUTH ONCE DAILY 30 tablet 5   No current facility-administered medications on file prior to visit.     BP (!) 148/74 (BP Location: Left Arm, Patient Position: Sitting, Cuff Size: Large)   Pulse 87   Temp 98.8 F (37.1 C) (Oral)   Resp 17   Ht '5\' 2"'$  (1.575 m)   Wt 223 lb 12.8 oz (101.5 kg)   LMP 06/28/2016   SpO2 94%   BMI 40.93 kg/m    Objective:   Physical Exam  Constitutional: She is oriented to person, place, and time. She appears well-developed and well-nourished.  HENT:  Head: Normocephalic and atraumatic.  Cardiovascular: Normal rate, regular rhythm and normal heart sounds.   No murmur heard. Pulmonary/Chest: Effort normal and breath sounds normal. No respiratory distress. She has no wheezes.  Abdominal: Bowel sounds are normal. She exhibits no distension and no mass. There is no tenderness. There is no rebound and no guarding.  Neurological: She is alert and oriented to person, place, and time.  Skin: Skin is warm and dry.  Psychiatric: She has a normal mood and affect. Her behavior is normal. Judgment and thought content normal.          Assessment & Plan:  Viral Gastroenteritis- Seems to be improving, tolerating clear liquids.  Pt is advised as follows:  Please complete  lab work prior to leaving. You may use imodium as needed for diarrhea. Make sure to drink plenty of fluids and add back in solid food slowly as tolerated. Call if new/worsening symptoms or if unable to keep down liquids. Let us know if your symptoms are not improved in 2  days.

## 2016-07-29 ENCOUNTER — Other Ambulatory Visit: Payer: Self-pay | Admitting: Family

## 2016-08-27 ENCOUNTER — Other Ambulatory Visit: Payer: Self-pay | Admitting: Family

## 2016-09-12 ENCOUNTER — Other Ambulatory Visit: Payer: Self-pay | Admitting: Family

## 2016-09-13 NOTE — Telephone Encounter (Signed)
Requested drug refills are authorized, however, the patient needs further evaluation and/or laboratory testing before further refills are given. Ask her to make an appointment for this.  Please call patient and have her schedule a Lab appointment for the Complete Blood Count [CBC] and the Comprehensive Metabolic Panel [Kidney, Liver, Mineral & electrolyte] that Melissa asked her to stop by lab and have done at 07/28/16 OV; needed for future refill authorizations. Thanks/SLS 04/30

## 2016-09-14 NOTE — Telephone Encounter (Signed)
Patient scheduled for   09/20/16 (Mon) 7:15 AM   Lab work only

## 2016-09-20 ENCOUNTER — Other Ambulatory Visit (INDEPENDENT_AMBULATORY_CARE_PROVIDER_SITE_OTHER): Payer: 59

## 2016-09-20 DIAGNOSIS — R779 Abnormality of plasma protein, unspecified: Secondary | ICD-10-CM | POA: Diagnosis not present

## 2016-09-21 LAB — PROTEIN ELECTROPHORESIS, URINE REFLEX
Albumin ELP, Urine: 39.5 %
Alpha-1-Globulin, U: 6.6 %
Alpha-2-Globulin, U: 11.1 %
Beta Globulin, U: 19 %
Gamma Globulin, U: 23.8 %
Protein, Ur: 14.5 mg/dL

## 2016-09-22 ENCOUNTER — Ambulatory Visit (INDEPENDENT_AMBULATORY_CARE_PROVIDER_SITE_OTHER): Payer: 59 | Admitting: Endocrinology

## 2016-09-22 ENCOUNTER — Encounter: Payer: Self-pay | Admitting: Endocrinology

## 2016-09-22 ENCOUNTER — Other Ambulatory Visit: Payer: Self-pay

## 2016-09-22 VITALS — BP 144/90 | HR 75 | Ht 62.0 in | Wt 220.2 lb

## 2016-09-22 DIAGNOSIS — E1122 Type 2 diabetes mellitus with diabetic chronic kidney disease: Secondary | ICD-10-CM | POA: Diagnosis not present

## 2016-09-22 DIAGNOSIS — E1165 Type 2 diabetes mellitus with hyperglycemia: Secondary | ICD-10-CM

## 2016-09-22 LAB — PROTEIN ELECTROPHORESIS, SERUM, WITH REFLEX
Albumin ELP: 3.9 g/dL (ref 3.8–4.8)
Alpha-1-Globulin: 0.3 g/dL (ref 0.2–0.3)
Alpha-2-Globulin: 1 g/dL — ABNORMAL HIGH (ref 0.5–0.9)
Beta 2: 0.5 g/dL (ref 0.2–0.5)
Beta Globulin: 0.5 g/dL (ref 0.4–0.6)
Gamma Globulin: 1.7 g/dL (ref 0.8–1.7)
Total Protein, Serum Electrophoresis: 7.8 g/dL (ref 6.1–8.1)

## 2016-09-22 LAB — PROTEIN ELECTROPHORESIS, SERUM
Albumin ELP: 3.9 g/dL (ref 3.8–4.8)
Alpha-1-Globulin: 0.3 g/dL (ref 0.2–0.3)
Alpha-2-Globulin: 1 g/dL — ABNORMAL HIGH (ref 0.5–0.9)
Beta 2: 0.5 g/dL (ref 0.2–0.5)
Beta Globulin: 0.5 g/dL (ref 0.4–0.6)
Gamma Globulin: 1.7 g/dL (ref 0.8–1.7)
Total Protein, Serum Electrophoresis: 7.8 g/dL (ref 6.1–8.1)

## 2016-09-22 LAB — GLUCOSE, POCT (MANUAL RESULT ENTRY): POC Glucose: 200 mg/dl — AB (ref 70–99)

## 2016-09-22 MED ORDER — GLUCOSE BLOOD VI STRP: ORAL_STRIP | 3 refills | Status: DC

## 2016-09-22 MED ORDER — CANAGLIFLOZIN 300 MG PO TABS: 300.0000 mg | ORAL_TABLET | Freq: Every day | ORAL | 1 refills | Status: DC

## 2016-09-22 MED ORDER — METFORMIN HCL ER 500 MG PO TB24: 500.0000 mg | ORAL_TABLET | Freq: Two times a day (BID) | ORAL | 1 refills | Status: DC

## 2016-09-22 NOTE — Progress Notes (Signed)
Patient ID: Susan Whitaker, female   DOB: 1971-04-15, 46 y.o.   MRN: [id number]           Reason for Appointment: Follow-up for Type 2 Diabetes  Referring physician:  Sandford Craze   History of Present Illness:          Date of diagnosis of type 2 diabetes mellitus: 2013        Background history:  She was diagnosed to have diabetes when she was having fibroid surgery. A1c in 2013 was 6.9 She was treated with metformin but she says she was not able to take this because of diarrhea and would be irregular with it However her blood sugars stayed about the same until 2015 when they were higher and glipizide added Her blood sugars had been much higher since about late 2015 with A1c in the 9-10 range with her taking glipizide alone  Recent history:   Non-insulin hypoglycemic drugs the patient is taking are: Invokana 100 mg daily, Januvia 100 mg daily, metformin ER 500 mg daily   Her A1c in February was up to 7.3, previously down to 6.7  Current management, blood sugar patterns and problems identified:  She has not been seen for several months now  She says she has not found her meter which she lost about a week ago and unable to download this  Blood sugars are reportedly over 200 at home in the mornings although lab glucose was 123 and February  Blood sugar is 200 today in the office, she says she likes to eat grapes in the morning for breakfast  Since her last visit she has been on Invokana instead of Jardiance, previously had somewhat better blood sugars with Invokana  However has gained weight despite saying that she is exercising as before and watching her carbohydrates  Side effects from medications have been: Diarrhea from regular metformin  Compliance with the medical regimen: Fair  Glucose monitoring:   Less than once a day recently , previously done up to 2 times a day         Glucometer:  FreeStyle      Blood Glucose readings by  recall:   PRE-MEAL Fasting  Lunch Dinner Bedtime Overall  Glucose range: 200-220    <150   Mean/median:         Self-care: The diet that the patient has been following is: tries to limit Portions .     Typical meal intake: Breakfast is  fruit, Activia Yogurt,  For snacks she will have Snack box, chicken breast at dinnertime.  Avoiding Sweet drinks                 Dietician visit, most recent: 5/17, has  had diabetes education classes               Exercise: Walking 30 min at work   Weight history:  Wt Readings from Last 3 Encounters:  09/22/16 220 lb 3.2 oz (99.9 kg)  07/28/16 223 lb 12.8 oz (101.5 kg)  07/12/16 220 lb 6.4 oz (100 kg)    Glycemic control:   Lab Results  Component Value Date   HGBA1C 7.3 (H) 07/09/2016   HGBA1C 6.8 (H) 03/24/2016   HGBA1C 6.7 (H) 12/16/2015   Lab Results  Component Value Date   MICROALBUR 2.2 (H) 07/09/2016   LDLCALC 171 (H) 03/24/2016   CREATININE 0.91 07/09/2016   Lab Results  Component Value Date   MICRALBCREAT 2.9 07/09/2016    Other  active problems are in review of systems    Allergies as of 09/22/2016      Reactions   Lisinopril    REACTION: ACE cough      Medication List       Accurate as of 09/22/16  9:24 AM. Always use your most recent med list.          AMBULATORY NON FORMULARY MEDICATION BORIC ACID CAPSULE 600mg . Insert 1 capsule vaginally two times a week.   amLODipine 10 MG tablet Commonly known as:  NORVASC TAKE ONE TABLET BY MOUTH ONCE DAILY   aspirin EC 81 MG tablet Take 1 tablet (81 mg total) by mouth daily.   atorvastatin 20 MG tablet Commonly known as:  LIPITOR Take 1 tablet (20 mg total) by mouth daily.   beclomethasone 40 MCG/ACT inhaler Commonly known as:  QVAR Inhale 2 puffs into the lungs 2 (two) times daily.   CALTRATE 600+D 600-400 MG-UNIT tablet Generic drug:  Calcium Carbonate-Vitamin D Take 1 tablet by mouth 2 (two) times daily. Reported on 10/20/2015   canagliflozin 100 MG Tabs tablet Commonly known as:   INVOKANA Take 1 tablet (100 mg total) by mouth daily before breakfast.   fluconazole 150 MG tablet Commonly known as:  DIFLUCAN 1 tablet by mouth today, may repeat in 3 days.   freestyle lancets Use as instructed to check blood sugar 3 times a day dx code E11.9   glucose blood test strip Commonly known as:  ONETOUCH VERIO Use to test blood sugar 1 time daily   glucose monitoring kit monitoring kit by Does not apply route as needed for other.   INTEGRA PLUS Caps TAKE ONE CAPSULE BY MOUTH ONCE DAILY   JANUVIA 100 MG tablet Generic drug:  sitaGLIPtin TAKE ONE TABLET BY MOUTH ONCE DAILY   losartan 100 MG tablet Commonly known as:  COZAAR TAKE ONE TABLET BY MOUTH ONCE DAILY   metFORMIN 500 MG 24 hr tablet Commonly known as:  GLUCOPHAGE-XR TAKE ONE TABLET BY MOUTH ONCE DAILY WITH BREAKFAST   metoprolol 100 MG tablet Commonly known as:  LOPRESSOR TAKE ONE TABLET BY MOUTH TWICE DAILY   metroNIDAZOLE 0.75 % vaginal gel Commonly known as:  METROGEL VAGINAL 1 application nightly x5 days.   Naproxen Sodium 750 MG Tb24 Take 750 mg by mouth as needed. with food   nystatin powder Commonly known as:  nystatin Apply twice daily beneath breasts.   OVER THE COUNTER MEDICATION Take 1 tablet by mouth daily. POTASSIUM   rosuvastatin 20 MG tablet Commonly known as:  CRESTOR TAKE ONE TABLET BY MOUTH ONCE DAILY   spironolactone 50 MG tablet Commonly known as:  ALDACTONE TAKE ONE TABLET BY MOUTH ONCE DAILY       Allergies:  Allergies  Allergen Reactions  . Lisinopril     REACTION: ACE cough    Past Medical History:  Diagnosis Date  . Allergy    allergic rhinitis  . Diabetes mellitus    type II  . FHx: hypertension   . Gastric ulcer   . GERD (gastroesophageal reflux disease)   . H/O constipation   . H/O hemorrhoids   . H/O varicella   . Hyperlipidemia   . Hypertension   . Hypokalemia   . Increased BMI 06/2010  . Primary hyperaldosteronism ([facility]) 02/21/2012  .  SVT (supraventricular tachycardia) ([facility])    Noted 11/2011 admission  . Thrombocytopenia ([facility])   . Thyroid fullness 08/2005  . Urinary incontinence     Past Surgical  History:  Procedure Laterality Date  . ABDOMINAL SURGERY     64 months of age-- unsure of type of surgery  . COLONOSCOPY  04/2016   hemorrhoids--normal per pt with Dr Loreta Ave  . HEMORRHOID SURGERY  2005/2006  . UTERINE FIBROID EMBOLIZATION      Family History  Problem Relation Age of Onset  . Cancer Mother     breast  . Stroke Mother   . Cancer Maternal Grandmother     breast  . Hypertension Maternal Grandmother   . Cancer Other     breast, lung  . Hypertension Other   . Stroke Other   . Vision loss Father   . Heart disease Father     Rhythm disturbance  . Diabetes Neg Hx     Social History:  reports that she has never smoked. She has never used smokeless tobacco. She reports that she does not drink alcohol or use drugs.    Review of Systems    Lipid history: LDL 171 Has not had follow-up levels so far with Starting Lipitor    Lab Results  Component Value Date   CHOL 246 (H) 03/24/2016   HDL 45.40 03/24/2016   LDLCALC 171 (H) 03/24/2016   LDLDIRECT 103.1 03/04/2014   TRIG 149.0 03/24/2016   CHOLHDL 5 03/24/2016           Hypertension: Present for several years. Blood pressure appears to be variable    Previously has had mostly low normal or low blood potassium levels and has been treated with Aldactone, losartan, amlodipine and metoprolol Potassium more recently has been normal  BP Readings from Last 3 Encounters:  09/22/16 (!) 144/90  07/28/16 (!) 148/74  07/12/16 128/69    Lab Results  Component Value Date   CREATININE 0.91 07/09/2016   BUN 17 07/09/2016   NA 135 07/09/2016   K 3.9 07/09/2016   CL 100 07/09/2016   CO2 27 07/09/2016     Most recent eye exam was 12/16  Most recent foot exam: 5/17  Review of Systems  She has been noted to have a goiter with multiple nodules,  stable as of 2015, Does have 1.4 cm nodule in the right side  LABS:  Lab Results  Component Value Date   TSH 1.60 03/24/2016     Office Visit on 09/22/2016  Component Date Value Ref Range Status  . POC Glucose 09/22/2016 200* 70 - 99 mg/dl Final  Lab on 40/98/1191  Component Date Value Ref Range Status  . Protein, Ur 09/20/2016 14.5  Not Estab. mg/dL Final  . Albumin ELP, Urine 09/20/2016 39.5  % Final  . Alpha-1-Globulin, U 09/20/2016 6.6  % Final  . Alpha-2-Globulin, U 09/20/2016 11.1  % Final  . Beta Globulin, U 09/20/2016 19.0  % Final  . Gamma Globulin, U 09/20/2016 23.8  % Final  . M Component, Ur 09/20/2016 Not Observed  Not Observed % Final  . Please Note: 09/20/2016 Comment   Final   Comment: Protein electrophoresis scan will follow via computer, mail, or courier delivery.     Physical Examination:  BP (!) 144/90   Pulse 75   Ht 5\' 2"  (1.575 m)   Wt 220 lb 3.2 oz (99.9 kg)   SpO2 97%   BMI 40.28 kg/m       ASSESSMENT:  Diabetes type 2, uncontrolled with BMI 40   See history of present illness for detailed discussion of current diabetes management, blood sugar patterns and problems identified  She is on a multidrug regimen with Januvia, Invokana and low-dose metformin ER A1c is 7.3 less than 3 months ago She reports higher fasting readings but not able to confirm this Her diet can be better and she can probably benefit from seeing the dietitian again She does try to exercise and needs to continue  Hyperlipidemia: She is on Lipitor and will have follow-up on her next visit   PLAN:    She will take 200 mg of Invokana and finish of her prescription, next prescription will be 300  Try to take additional metformin at dinner  Needs to do more consistent readings both in the morning and after meals  A1c in 6 weeks  Bring monitor for download   There are no Patient Instructions on file for this visit.  Susan Whitaker 09/22/2016, 9:24 AM   Note: This  office note was prepared with Dragon voice recognition system technology. Any transcriptional errors that result from this process are unintentional.

## 2016-09-22 NOTE — Patient Instructions (Signed)
Invokana take 2 in am then next Rx is 300mg  Metformin at Bfst and dinner  Check blood sugars on waking up  3x weekly  Also check blood sugars about 2 hours after a meal and do this after different meals by rotation  Recommended blood sugar levels on waking up is 90-130 and about 2 hours after meal is 130-160  Please bring your blood sugar monitor to each visit, thank you

## 2016-09-28 ENCOUNTER — Other Ambulatory Visit: Payer: Self-pay

## 2016-09-28 MED ORDER — GLUCOSE BLOOD VI STRP: ORAL_STRIP | 3 refills | Status: DC

## 2016-09-28 MED ORDER — ONETOUCH DELICA LANCETS FINE MISC: 3 refills | Status: DC

## 2016-10-26 ENCOUNTER — Other Ambulatory Visit: Payer: Self-pay | Admitting: Family

## 2016-10-28 ENCOUNTER — Telehealth: Payer: Self-pay | Admitting: *Deleted

## 2016-10-28 NOTE — Telephone Encounter (Signed)
Ok to change to 25mg  tablets.

## 2016-10-28 NOTE — Telephone Encounter (Signed)
Faxed refill request received from [facility] stating that Spironolactone 50 mg tablet is on Back Order, request to change to 25 mg tablets; Please Advise/SLS 06/14

## 2016-10-29 MED ORDER — SPIRONOLACTONE 25 MG PO TABS: 50.0000 mg | ORAL_TABLET | Freq: Every day | ORAL | 0 refills | Status: DC

## 2016-10-29 NOTE — Telephone Encounter (Signed)
Rx request to pharmacy [25 mg tablets] while 50 mg tablet is on back order, per provider instructions/SLS 06/15

## 2016-11-01 ENCOUNTER — Other Ambulatory Visit (INDEPENDENT_AMBULATORY_CARE_PROVIDER_SITE_OTHER): Payer: 59

## 2016-11-01 DIAGNOSIS — E1165 Type 2 diabetes mellitus with hyperglycemia: Secondary | ICD-10-CM | POA: Diagnosis not present

## 2016-11-01 LAB — COMPREHENSIVE METABOLIC PANEL
ALT: 11 U/L (ref 0–35)
AST: 13 U/L (ref 0–37)
Albumin: 4 g/dL (ref 3.5–5.2)
Alkaline Phosphatase: 52 U/L (ref 39–117)
BUN: 17 mg/dL (ref 6–23)
CO2: 27 mEq/L (ref 19–32)
Calcium: 9.2 mg/dL (ref 8.4–10.5)
Chloride: 101 mEq/L (ref 96–112)
Creatinine, Ser: 0.9 mg/dL (ref 0.40–1.20)
GFR: 86.07 mL/min (ref >60.00)
Glucose, Bld: 137 mg/dL — ABNORMAL HIGH (ref 70–99)
Potassium: 3.3 mEq/L — ABNORMAL LOW (ref 3.5–5.1)
Sodium: 136 mEq/L (ref 135–145)
Total Bilirubin: 0.3 mg/dL (ref 0.2–1.2)
Total Protein: 7.7 g/dL (ref 6.0–8.3)

## 2016-11-01 LAB — LIPID PANEL
Cholesterol: 156 mg/dL (ref 0–200)
HDL: 37.4 mg/dL — ABNORMAL LOW (ref >39.00)
LDL Cholesterol: 103 mg/dL — ABNORMAL HIGH (ref 0–99)
NonHDL: 118.67
Total CHOL/HDL Ratio: 4
Triglycerides: 77 mg/dL (ref 0.0–149.0)
VLDL: 15.4 mg/dL (ref 0.0–40.0)

## 2016-11-01 LAB — HEMOGLOBIN A1C: Hgb A1c MFr Bld: 7.1 % — ABNORMAL HIGH (ref 4.6–6.5)

## 2016-11-03 ENCOUNTER — Other Ambulatory Visit: Payer: Self-pay

## 2016-11-07 NOTE — Progress Notes (Deleted)
Patient ID: Susan Whitaker, female   DOB: [birth year], 46 y.o.   MRN: [id number]           Reason for Appointment: Follow-up for Type 2 Diabetes  Referring physician:  Sandford Craze   History of Present Illness:          Date of diagnosis of type 2 diabetes mellitus: 2013        Background history:  She was diagnosed to have diabetes when she was having fibroid surgery. A1c in 2013 was 6.9 She was treated with metformin but she says she was not able to take this because of diarrhea and would be irregular with it However her blood sugars stayed about the same until 2015 when they were higher and glipizide added Her blood sugars had been much higher since about late 2015 with A1c in the 9-10 range with her taking glipizide alone  Recent history:   Non-insulin hypoglycemic drugs the patient is taking are: Invokana 100 mg daily, Januvia 100 mg daily, metformin ER 500 mg daily   Her A1c in February was up to 7.3, previously down to 6.7  Current management, blood sugar patterns and problems identified:  She has not been seen for several months now  She says she has not found her meter which she lost about a week ago and unable to download this  Blood sugars are reportedly over 200 at home in the mornings although lab glucose was 123 and February  Blood sugar is 200 today in the office, she says she likes to eat grapes in the morning for breakfast  Since her last visit she has been on Invokana instead of Jardiance, previously had somewhat better blood sugars with Invokana  However has gained weight despite saying that she is exercising as before and watching her carbohydrates  Side effects from medications have been: Diarrhea from regular metformin  Compliance with the medical regimen: Fair  Glucose monitoring:   Less than once a day recently , previously done up to 2 times a day         Glucometer:  FreeStyle      Blood Glucose readings by  recall:   PRE-MEAL Fasting  Lunch Dinner Bedtime Overall  Glucose range: 200-220    <150   Mean/median:         Self-care: The diet that the patient has been following is: tries to limit Portions .     Typical meal intake: Breakfast is  fruit, Activia Yogurt,  For snacks she will have Snack box, chicken breast at dinnertime.  Avoiding Sweet drinks                 Dietician visit, most recent: 5/17, has  had diabetes education classes               Exercise: Walking 30 min at work   Weight history:  Wt Readings from Last 3 Encounters:  09/22/16 220 lb 3.2 oz (99.9 kg)  07/28/16 223 lb 12.8 oz (101.5 kg)  07/12/16 220 lb 6.4 oz (100 kg)    Glycemic control:   Lab Results  Component Value Date   HGBA1C 7.1 (H) 11/01/2016   HGBA1C 7.3 (H) 07/09/2016   HGBA1C 6.8 (H) 03/24/2016   Lab Results  Component Value Date   MICROALBUR 2.2 (H) 07/09/2016   LDLCALC 103 (H) 11/01/2016   CREATININE 0.90 11/01/2016   Lab Results  Component Value Date   MICRALBCREAT 2.9 07/09/2016    Other  active problems are in review of systems    Allergies as of 11/08/2016      Reactions   Lisinopril    REACTION: ACE cough      Medication List       Accurate as of 11/07/16  9:32 PM. Always use your most recent med list.          AMBULATORY NON FORMULARY MEDICATION BORIC ACID CAPSULE 600mg . Insert 1 capsule vaginally two times a week.   amLODipine 10 MG tablet Commonly known as:  NORVASC Take 1 tablet (10 mg total) by mouth daily.   aspirin EC 81 MG tablet Take 1 tablet (81 mg total) by mouth daily.   atorvastatin 20 MG tablet Commonly known as:  LIPITOR Take 1 tablet (20 mg total) by mouth daily.   beclomethasone 40 MCG/ACT inhaler Commonly known as:  QVAR Inhale 2 puffs into the lungs 2 (two) times daily.   CALTRATE 600+D 600-400 MG-UNIT tablet Generic drug:  Calcium Carbonate-Vitamin D Take 1 tablet by mouth 2 (two) times daily. Reported on 10/20/2015   canagliflozin 300 MG Tabs tablet Commonly known  as:  INVOKANA Take 1 tablet (300 mg total) by mouth daily before breakfast.   fluconazole 150 MG tablet Commonly known as:  DIFLUCAN 1 tablet by mouth today, may repeat in 3 days.   glucose blood test strip Commonly known as:  ONETOUCH VERIO Use to check blood sugar 3 times daily   INTEGRA PLUS Caps TAKE ONE CAPSULE BY MOUTH ONCE DAILY   JANUVIA 100 MG tablet Generic drug:  sitaGLIPtin TAKE ONE TABLET BY MOUTH ONCE DAILY   losartan 100 MG tablet Commonly known as:  COZAAR TAKE ONE TABLET BY MOUTH ONCE DAILY   metFORMIN 500 MG 24 hr tablet Commonly known as:  GLUCOPHAGE-XR Take 1 tablet (500 mg total) by mouth 2 (two) times daily.   metoprolol tartrate 100 MG tablet Commonly known as:  LOPRESSOR TAKE ONE TABLET BY MOUTH TWICE DAILY   metroNIDAZOLE 0.75 % vaginal gel Commonly known as:  METROGEL VAGINAL 1 application nightly x5 days.   Naproxen Sodium 750 MG Tb24 Take 750 mg by mouth as needed. with food   nystatin powder Commonly known as:  nystatin Apply twice daily beneath breasts.   ONETOUCH DELICA LANCETS FINE Misc Use to check blood sugar 3 times daily   OVER THE COUNTER MEDICATION Take 1 tablet by mouth daily. POTASSIUM   rosuvastatin 20 MG tablet Commonly known as:  CRESTOR TAKE ONE TABLET BY MOUTH ONCE DAILY   spironolactone 50 MG tablet Commonly known as:  ALDACTONE Take 1 tablet (50 mg total) by mouth daily.   spironolactone 25 MG tablet Commonly known as:  ALDACTONE Take 2 tablets (50 mg total) by mouth daily. *while 50 mg tablet is on back order*       Allergies:  Allergies  Allergen Reactions  . Lisinopril     REACTION: ACE cough    Past Medical History:  Diagnosis Date  . Allergy    allergic rhinitis  . Diabetes mellitus    type II  . FHx: hypertension   . Gastric ulcer   . GERD (gastroesophageal reflux disease)   . H/O constipation   . H/O hemorrhoids   . H/O varicella   . Hyperlipidemia   . Hypertension   . Hypokalemia    . Increased BMI 06/2010  . Primary hyperaldosteronism ([facility]) 02/21/2012  . SVT (supraventricular tachycardia) (HCC)    Noted 11/2011 admission  . Thrombocytopenia (  HCC)   . Thyroid fullness 08/2005  . Urinary incontinence     Past Surgical History:  Procedure Laterality Date  . ABDOMINAL SURGERY     76 months of age-- unsure of type of surgery  . COLONOSCOPY  04/2016   hemorrhoids--normal per pt with Dr Loreta Ave  . HEMORRHOID SURGERY  2005/2006  . UTERINE FIBROID EMBOLIZATION      Family History  Problem Relation Age of Onset  . Cancer Mother        breast  . Stroke Mother   . Cancer Maternal Grandmother        breast  . Hypertension Maternal Grandmother   . Cancer Other        breast, lung  . Hypertension Other   . Stroke Other   . Vision loss Father   . Heart disease Father        Rhythm disturbance  . Diabetes Neg Hx     Social History:  reports that she has never smoked. She has never used smokeless tobacco. She reports that she does not drink alcohol or use drugs.    Review of Systems    Lipid history: LDL 171 Has not had follow-up levels so far with Starting Lipitor    Lab Results  Component Value Date   CHOL 156 11/01/2016   HDL 37.40 (L) 11/01/2016   LDLCALC 103 (H) 11/01/2016   LDLDIRECT 103.1 03/04/2014   TRIG 77.0 11/01/2016   CHOLHDL 4 11/01/2016           Hypertension: Present for several years. Blood pressure appears to be variable    Previously has had mostly low normal or low blood potassium levels and has been treated with Aldactone, losartan, amlodipine and metoprolol Potassium more recently has been normal  BP Readings from Last 3 Encounters:  09/22/16 (!) 144/90  07/28/16 (!) 148/74  07/12/16 128/69    Lab Results  Component Value Date   CREATININE 0.90 11/01/2016   BUN 17 11/01/2016   NA 136 11/01/2016   K 3.3 (L) 11/01/2016   CL 101 11/01/2016   CO2 27 11/01/2016     Most recent eye exam was 12/16  Most recent foot exam:  5/17  Review of Systems  She has been noted to have a goiter with multiple nodules, stable as of 2015, Does have 1.4 cm nodule in the right side  LABS:  Lab Results  Component Value Date   TSH 1.60 03/24/2016     No visits with results within 1 Week(s) from this visit.  Latest known visit with results is:  Lab on 11/01/2016  Component Date Value Ref Range Status  . Hgb A1c MFr Bld 11/01/2016 7.1* 4.6 - 6.5 % Final   Glycemic Control Guidelines for People with Diabetes:Non Diabetic:  <6%Goal of Therapy: <7%Additional Action Suggested:  >8%   . Sodium 11/01/2016 136  135 - 145 mEq/L Final  . Potassium 11/01/2016 3.3* 3.5 - 5.1 mEq/L Final  . Chloride 11/01/2016 101  96 - 112 mEq/L Final  . CO2 11/01/2016 27  19 - 32 mEq/L Final  . Glucose, Bld 11/01/2016 137* 70 - 99 mg/dL Final  . BUN 13/24/4010 17  6 - 23 mg/dL Final  . Creatinine, Ser 11/01/2016 0.90  0.40 - 1.20 mg/dL Final  . Total Bilirubin 11/01/2016 0.3  0.2 - 1.2 mg/dL Final  . Alkaline Phosphatase 11/01/2016 52  39 - 117 U/L Final  . AST 11/01/2016 13  0 - 37 U/L Final  .  ALT 11/01/2016 11  0 - 35 U/L Final  . Total Protein 11/01/2016 7.7  6.0 - 8.3 g/dL Final  . Albumin 47/82/9562 4.0  3.5 - 5.2 g/dL Final  . Calcium 13/12/6576 9.2  8.4 - 10.5 mg/dL Final  . GFR 46/96/2952 86.07  >60.00 mL/min Final  . Cholesterol 11/01/2016 156  0 - 200 mg/dL Final   ATP III Classification       Desirable:  < 200 mg/dL               Borderline High:  200 - 239 mg/dL          High:  > = 841 mg/dL  . Triglycerides 11/01/2016 77.0  0.0 - 149.0 mg/dL Final   Normal:  <324 mg/dLBorderline High:  150 - 199 mg/dL  . HDL 11/01/2016 37.40* >39.00 mg/dL Final  . VLDL 40/02/2724 15.4  0.0 - 40.0 mg/dL Final  . LDL Cholesterol 11/01/2016 103* 0 - 99 mg/dL Final  . Total CHOL/HDL Ratio 11/01/2016 4   Final                  Men          Women1/2 Average Risk     3.4          3.3Average Risk          5.0          4.42X Average Risk          9.6           7.13X Average Risk          15.0          11.0                      . NonHDL 11/01/2016 118.67   Final   NOTE:  Non-HDL goal should be 30 mg/dL higher than patient's LDL goal (i.e. LDL goal of < 70 mg/dL, would have non-HDL goal of < 100 mg/dL)    Physical Examination:  There were no vitals taken for this visit.      ASSESSMENT:  Diabetes type 2, uncontrolled with BMI 40   See history of present illness for detailed discussion of current diabetes management, blood sugar patterns and problems identified  She is on a multidrug regimen with Januvia, Invokana and low-dose metformin ER A1c is 7.3 less than 3 months ago She reports higher fasting readings but not able to confirm this Her diet can be better and she can probably benefit from seeing the dietitian again She does try to exercise and needs to continue  Hyperlipidemia: She is on Lipitor and will have follow-up on her next visit   PLAN:    She will take 200 mg of Invokana and finish of her prescription, next prescription will be 300  Try to take additional metformin at dinner  Needs to do more consistent readings both in the morning and after meals  A1c in 6 weeks  Bring monitor for download   There are no Patient Instructions on file for this visit.  Jef Futch 11/07/2016, 9:32 PM   Note: This office note was prepared with Dragon voice recognition system technology. Any transcriptional errors that result from this process are unintentional.

## 2016-11-08 ENCOUNTER — Ambulatory Visit: Payer: Self-pay | Admitting: Endocrinology

## 2016-11-09 NOTE — Progress Notes (Signed)
Please call to let patient know that the potassium was low, needs to discuss this with her PCP

## 2016-11-11 ENCOUNTER — Encounter: Payer: Self-pay | Admitting: Family

## 2016-11-11 DIAGNOSIS — E876 Hypokalemia: Secondary | ICD-10-CM

## 2016-11-19 ENCOUNTER — Other Ambulatory Visit (INDEPENDENT_AMBULATORY_CARE_PROVIDER_SITE_OTHER): Payer: 59

## 2016-11-19 DIAGNOSIS — E876 Hypokalemia: Secondary | ICD-10-CM | POA: Diagnosis not present

## 2016-11-19 LAB — BASIC METABOLIC PANEL
BUN: 25 mg/dL — ABNORMAL HIGH (ref 6–23)
CO2: 26 mEq/L (ref 19–32)
Calcium: 9.3 mg/dL (ref 8.4–10.5)
Chloride: 101 mEq/L (ref 96–112)
Creatinine, Ser: 1.03 mg/dL (ref 0.40–1.20)
GFR: 73.64 mL/min (ref >60.00)
Glucose, Bld: 143 mg/dL — ABNORMAL HIGH (ref 70–99)
Potassium: 3.6 mEq/L (ref 3.5–5.1)
Sodium: 136 mEq/L (ref 135–145)

## 2016-11-21 ENCOUNTER — Encounter: Payer: Self-pay | Admitting: Family

## 2016-11-23 ENCOUNTER — Encounter: Payer: Self-pay | Admitting: Endocrinology

## 2016-11-23 ENCOUNTER — Ambulatory Visit (INDEPENDENT_AMBULATORY_CARE_PROVIDER_SITE_OTHER): Payer: 59 | Admitting: Endocrinology

## 2016-11-23 VITALS — BP 138/82 | HR 66 | Ht 62.0 in | Wt 217.4 lb

## 2016-11-23 DIAGNOSIS — E876 Hypokalemia: Secondary | ICD-10-CM | POA: Diagnosis not present

## 2016-11-23 DIAGNOSIS — I10 Essential (primary) hypertension: Secondary | ICD-10-CM

## 2016-11-23 DIAGNOSIS — E1165 Type 2 diabetes mellitus with hyperglycemia: Secondary | ICD-10-CM | POA: Diagnosis not present

## 2016-11-23 DIAGNOSIS — E7800 Pure hypercholesterolemia, unspecified: Secondary | ICD-10-CM

## 2016-11-23 NOTE — Patient Instructions (Signed)
Take 2 Metformin at supper

## 2016-11-23 NOTE — Progress Notes (Signed)
Patient ID: Susan Whitaker, female   DOB: [birth year], 46 y.o.   MRN: [id number]           Reason for Appointment: Follow-up for Type 2 Diabetes  Referring physician:  Debbrah Alar   History of Present Illness:          Date of diagnosis of type 2 diabetes mellitus: 2013        Background history:  She was diagnosed to have diabetes when she was having fibroid surgery. A1c in 2013 was 6.9 She was treated with metformin but she says she was not able to take this because of diarrhea and would be irregular with it However her blood sugars stayed about the same until 2015 when they were higher and glipizide added Her blood sugars had been much higher since about late 2015 with A1c in the 9-10 range with her taking glipizide alone  Recent history:   Non-insulin hypoglycemic drugs the patient is taking are: Invokana 300 mg daily, Januvia 100 mg daily, metformin ER 500 mg daily   Her A1c in February June was 7.1 and she is coming now for her office visit Previous range 6.7-7.3  Current management, blood sugar patterns and problems identified:  She says she has not been able to get coverage for brand name blood sugar monitor is from her insurance and is using a [employer] meter  She is writing her blood sugar down in a notepad and not in a logbook  INVOKANA was increased up to 300 mg on her last visit because of inadequate control  No side effects with this  She is supposed to be taking 1000 mg of metformin ER but she forgot to increase the dose and takes only 500 mg, she thinks she gets diarrhea with taking twice a day  Blood sugar is 143 in the lab and last month was 137 fasting  However she thinks that her FASTING readings are high but somewhat variable and probably averaging about 140  She however reports excellent blood sugars one hour after evening meal or at bedtime  Her weight continues to come down gradually  She is still very compliant with trying to exercise 5 days  a week at work  She thinks she is doing better with cutting out certain carbohydrates and grapes  Side effects from medications have been: Diarrhea from regular metformin  Compliance with the medical regimen: Fair  Glucose monitoring:   Less than once a day recently , previously done up to 2 times a day         Glucometer:  FreeStyle      Blood Glucose readings by home record:   PRE-MEAL Fasting Lunch Dinner Bedtime Overall  Glucose range: 119-161    121-147   Mean/median:         Self-care: The diet that the patient has been following is: tries to limit Portions .     Typical meal intake: Breakfast is  fruit, Activia Yogurt,  For snacks she will have Snack box, chicken breast at dinnertime.  Avoiding Sweet drinks                 Dietician visit, most recent: 5/17, has  had diabetes education classes               Exercise: Walking 30 min at work   Weight history:  Wt Readings from Last 3 Encounters:  11/23/16 217 lb 6.4 oz (98.6 kg)  09/22/16 220 lb 3.2 oz (99.9  kg)  07/28/16 223 lb 12.8 oz (101.5 kg)    Glycemic control:   Lab Results  Component Value Date   HGBA1C 7.1 (H) 11/01/2016   HGBA1C 7.3 (H) 07/09/2016   HGBA1C 6.8 (H) 03/24/2016   Lab Results  Component Value Date   MICROALBUR 2.2 (H) 07/09/2016   LDLCALC 103 (H) 11/01/2016   CREATININE 1.03 11/19/2016   Lab Results  Component Value Date   MICRALBCREAT 2.9 07/09/2016    Other active problems are in review of systems    Allergies as of 11/23/2016      Reactions   Lisinopril    REACTION: ACE cough      Medication List       Accurate as of 11/23/16  4:42 PM. Always use your most recent med list.          AMBULATORY NON FORMULARY MEDICATION BORIC ACID CAPSULE 600mg . Insert 1 capsule vaginally two times a week.   amLODipine 10 MG tablet Commonly known as:  NORVASC Take 1 tablet (10 mg total) by mouth daily.   aspirin EC 81 MG tablet Take 1 tablet (81 mg total) by mouth daily.     atorvastatin 20 MG tablet Commonly known as:  LIPITOR Take 1 tablet (20 mg total) by mouth daily.   CALTRATE 600+D 600-400 MG-UNIT tablet Generic drug:  Calcium Carbonate-Vitamin D Take 1 tablet by mouth 2 (two) times daily. Reported on 10/20/2015   canagliflozin 300 MG Tabs tablet Commonly known as:  INVOKANA Take 1 tablet (300 mg total) by mouth daily before breakfast.   fluconazole 150 MG tablet Commonly known as:  DIFLUCAN 1 tablet by mouth today, may repeat in 3 days.   glucose blood test strip Commonly known as:  ONETOUCH VERIO Use to check blood sugar 3 times daily   JANUVIA 100 MG tablet Generic drug:  sitaGLIPtin TAKE ONE TABLET BY MOUTH ONCE DAILY   losartan 100 MG tablet Commonly known as:  COZAAR TAKE ONE TABLET BY MOUTH ONCE DAILY   metFORMIN 500 MG 24 hr tablet Commonly known as:  GLUCOPHAGE-XR Take 1 tablet (500 mg total) by mouth 2 (two) times daily.   metoprolol tartrate 100 MG tablet Commonly known as:  LOPRESSOR TAKE ONE TABLET BY MOUTH TWICE DAILY   metroNIDAZOLE 0.75 % vaginal gel Commonly known as:  METROGEL VAGINAL 1 application nightly x5 days.   Naproxen Sodium 750 MG Tb24 Take 750 mg by mouth as needed. with food   nystatin powder Commonly known as:  nystatin Apply twice daily beneath breasts.   ONETOUCH DELICA LANCETS FINE Misc Use to check blood sugar 3 times daily   OVER THE COUNTER MEDICATION Take 1 tablet by mouth daily. POTASSIUM   rosuvastatin 20 MG tablet Commonly known as:  CRESTOR TAKE ONE TABLET BY MOUTH ONCE DAILY   spironolactone 50 MG tablet Commonly known as:  ALDACTONE Take 1 tablet (50 mg total) by mouth daily.   spironolactone 25 MG tablet Commonly known as:  ALDACTONE Take 2 tablets (50 mg total) by mouth daily. *while 50 mg tablet is on back order*       Allergies:  Allergies  Allergen Reactions  . Lisinopril     REACTION: ACE cough    Past Medical History:  Diagnosis Date  . Allergy     allergic rhinitis  . Diabetes mellitus    type II  . FHx: hypertension   . Gastric ulcer   . GERD (gastroesophageal reflux disease)   . H/O  constipation   . H/O hemorrhoids   . H/O varicella   . Hyperlipidemia   . Hypertension   . Hypokalemia   . Increased BMI 06/2010  . Primary hyperaldosteronism ([facility]) 02/21/2012  . SVT (supraventricular tachycardia) ([facility])    Noted 11/2011 admission  . Thrombocytopenia ([facility])   . Thyroid fullness 08/2005  . Urinary incontinence     Past Surgical History:  Procedure Laterality Date  . ABDOMINAL SURGERY     90 months of age-- unsure of type of surgery  . COLONOSCOPY  04/2016   hemorrhoids--normal per pt with Dr Collene Mares  . HEMORRHOID SURGERY  2005/2006  . UTERINE FIBROID EMBOLIZATION      Family History  Problem Relation Age of Onset  . Cancer Mother        breast  . Stroke Mother   . Cancer Maternal Grandmother        breast  . Hypertension Maternal Grandmother   . Cancer Other        breast, lung  . Hypertension Other   . Stroke Other   . Vision loss Father   . Heart disease Father        Rhythm disturbance  . Diabetes Neg Hx     Social History:  reports that she has never smoked. She has never used smokeless tobacco. She reports that she does not drink alcohol or use drugs.    Review of Systems    Lipid history: LDL 171 At baseline Now taking Lipitor    Lab Results  Component Value Date   CHOL 156 11/01/2016   HDL 37.40 (L) 11/01/2016   LDLCALC 103 (H) 11/01/2016   LDLDIRECT 103.1 03/04/2014   TRIG 77.0 11/01/2016   CHOLHDL 4 11/01/2016           Hypertension: Present for several years. Blood pressure appears to be Relatively difficult to control and is on multiple medications Not monitoring at home     Previously has had mostly low normal or low blood potassium levels and has been treated with Aldactone, losartan, amlodipine and metoprolol Potassium ok now with 50 mg of Aldactone Has not been evaluated for  hyperaldosteronism No potassium supplements currently  Second blood pressure in the office was near normal  BP Readings from Last 3 Encounters:  11/23/16 138/82  09/22/16 (!) 144/90  07/28/16 (!) 148/74    Lab Results  Component Value Date   CREATININE 1.03 11/19/2016   BUN 25 (H) 11/19/2016   NA 136 11/19/2016   K 3.6 11/19/2016   CL 101 11/19/2016   CO2 26 11/19/2016     She has been noted to have a goiter with multiple nodules, stable as of 2015, Does have 1.4 cm nodule in the right side   Most recent foot exam: 7/18  Review of Systems    Lab on 11/19/2016  Component Date Value Ref Range Status  . Sodium 11/19/2016 136  135 - 145 mEq/L Final  . Potassium 11/19/2016 3.6  3.5 - 5.1 mEq/L Final  . Chloride 11/19/2016 101  96 - 112 mEq/L Final  . CO2 11/19/2016 26  19 - 32 mEq/L Final  . Glucose, Bld 11/19/2016 143* 70 - 99 mg/dL Final  . BUN 11/19/2016 25* 6 - 23 mg/dL Final  . Creatinine, Ser 11/19/2016 1.03  0.40 - 1.20 mg/dL Final  . Calcium 11/19/2016 9.3  8.4 - 10.5 mg/dL Final  . GFR 11/19/2016 73.64  >60.00 mL/min Final    Physical Examination:  BP  138/82   Pulse 66   Ht 5\' 2"  (1.575 m)   Wt 217 lb 6.4 oz (98.6 kg)   SpO2 96%   BMI 39.76 kg/m   Diabetic Foot Exam - Simple   Simple Foot Form Diabetic Foot exam was performed with the following findings:  Yes   Visual Inspection No deformities, no ulcerations, no other skin breakdown bilaterally:  Yes Sensation Testing Intact to touch and monofilament testing bilaterally:  Yes Pulse Check Posterior Tibialis and Dorsalis pulse intact bilaterally:  Yes Comments Decreased right posterior tibialis pulse    No pedal edema    ASSESSMENT:  Diabetes type 2, uncontrolled with BMI 40   See history of present illness for detailed discussion of current diabetes management, blood sugar patterns and problems identified  She is on a multidrug regimen with Januvia, Invokana 300 mg and low-dose metformin  ER A1c is still over 7 She thinks she has high fasting readings but they appear to be better with increasing Invokana She is also doing well with improving her diet and exercising regularly and is now starting to lose weight consistently Unable to review her monitor as she is using a Generic from [employer] and she does not think her insurance covers Freestyle or other brand names  She appears to be more motivated to do various things needed for better self care However fasting readings are probably still high and she can benefit from higher doses of metformin if she can tolerate them  Hyperlipidemia: She is on Lipitor and LDL is near 100, will continue  HYPERTENSION: She likely has primary hyperaldosteronism: See discussion above and review of systems This was discussed with patient and discussed general evaluation and management of this condition  For now since her blood pressure is improving she may not need any other intervention except continuing Aldactone Again will have follow-up potassium level on her next visit   PLAN:    She will take a blood sugar log book and keep her records for the next visit  Try to take additional metformin at dinner, may take both together  Discussed timing and targets of blood sugar readings  Needs to do more consistent readings both in the morning and after meals  A1c on the next visit  Discussed general principles of foot care  No change in antihypertensives as yet, consider increasing Aldactone if blood pressure is rising or potassium gets low again  Counseling time on subjects discussed in assessment and plan sections is over 50% of today's 25 minute visit  Patient Instructions  Take 2 Metformin at supper   [facility] 11/23/2016, 4:42 PM   Note: This office note was prepared with Estate agent. Any transcriptional errors that result from this process are unintentional.

## 2016-11-26 ENCOUNTER — Other Ambulatory Visit: Payer: Self-pay | Admitting: Family

## 2016-11-26 ENCOUNTER — Other Ambulatory Visit: Payer: Self-pay | Admitting: Endocrinology

## 2016-12-20 ENCOUNTER — Ambulatory Visit: Payer: Self-pay | Admitting: Endocrinology

## 2017-01-10 ENCOUNTER — Ambulatory Visit (INDEPENDENT_AMBULATORY_CARE_PROVIDER_SITE_OTHER): Payer: 59 | Admitting: Family

## 2017-01-10 ENCOUNTER — Encounter: Payer: Self-pay | Admitting: Family

## 2017-01-10 VITALS — BP 158/95 | HR 79 | Temp 99.4°F | Resp 16 | Ht 62.0 in | Wt 218.2 lb

## 2017-01-10 DIAGNOSIS — I10 Essential (primary) hypertension: Secondary | ICD-10-CM | POA: Diagnosis not present

## 2017-01-10 DIAGNOSIS — T148XXA Other injury of unspecified body region, initial encounter: Secondary | ICD-10-CM

## 2017-01-10 NOTE — Progress Notes (Signed)
Subjective:    Patient ID: Susan Whitaker, female    DOB: 1970/09/21, 46 y.o.   MRN: [id number]  HPI   Susan Whitaker is a 46 yr old female who presents today for follow up.  1) HTN-  Maintained on aldactone 50mg  daily, losartan, metoprolol.  Did not take metoprolol today.   BP Readings from Last 3 Encounters:  01/10/17 (!) 151/80  11/23/16 138/82  09/22/16 (!) 144/90   2) Foot injury- reports injury to right foot after pedicure on Friday.  Cleaning with peroxide.   3) Hyperlipidemia- maintained on crestor.  Lab Results  Component Value Date   CHOL 156 11/01/2016   HDL 37.40 (L) 11/01/2016   LDLCALC 103 (H) 11/01/2016   LDLDIRECT 103.1 03/04/2014   TRIG 77.0 11/01/2016   CHOLHDL 4 11/01/2016     Review of Systems    see HPI  Past Medical History:  Diagnosis Date  . Allergy    allergic rhinitis  . Diabetes mellitus    type II  . FHx: hypertension   . Gastric ulcer   . GERD (gastroesophageal reflux disease)   . H/O constipation   . H/O hemorrhoids   . H/O varicella   . Hyperlipidemia   . Hypertension   . Hypokalemia   . Increased BMI 06/2010  . Primary hyperaldosteronism ([place]) 02/21/2012  . SVT (supraventricular tachycardia) ([place])    Noted 11/2011 admission  . Thrombocytopenia ([place])   . Thyroid fullness 08/2005  . Urinary incontinence      Social History   Social History  . Marital status: Single    Spouse name: N/A  . Number of children: 2  . Years of education: N/A   Occupational History  . 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 Smoking status: Never Smoker  . Smokeless tobacco: Never Used  . Alcohol use No  . Drug use: No  . Sexual activity: Not on file   Other Topics Concern  . Not on file   Social History Narrative   Works at [employer]          Past Surgical History:  Procedure Laterality Date  . ABDOMINAL SURGERY     65 months of age-- unsure of type of surgery  . COLONOSCOPY  04/2016   hemorrhoids--normal per pt with Dr Collene Mares  . HEMORRHOID SURGERY  2005/2006  . UTERINE FIBROID EMBOLIZATION      Family History  Problem Relation Age of Onset  . Cancer Mother        breast  . Stroke Mother   . Cancer Maternal Grandmother        breast  . Hypertension Maternal Grandmother   . Cancer Other        breast, lung  . Hypertension Other   . Stroke Other   . Vision loss Father   . Heart disease Father        Rhythm disturbance  . Diabetes Neg Hx     Allergies  Allergen Reactions  . Lisinopril     REACTION: ACE cough    Current Outpatient Prescriptions on File Prior to Visit  Medication Sig Dispense Refill  . AMBULATORY NON FORMULARY MEDICATION BORIC ACID CAPSULE 600mg . Insert 1 capsule vaginally two times a week. 8 capsule 5  . amLODipine (NORVASC) 10 MG tablet Take 1 tablet (10 mg total) by mouth daily. 30 tablet 3  . aspirin EC 81 MG tablet Take 1 tablet (81 mg total) by mouth  daily.    . atorvastatin (LIPITOR) 20 MG tablet Take 1 tablet (20 mg total) by mouth daily. 30 tablet 3  . Calcium Carbonate-Vitamin D (CALTRATE 600+D) 600-400 MG-UNIT per tablet Take 1 tablet by mouth 2 (two) times daily. Reported on 10/20/2015    . fluconazole (DIFLUCAN) 150 MG tablet 1 tablet by mouth today, may repeat in 3 days. 2 tablet 0  . glucose blood (ONETOUCH VERIO) test strip Use to check blood sugar 3 times daily 100 each 3  . INVOKANA 300 MG TABS tablet TAKE 1 TABLET BY MOUTH ONCE DAILY BEFORE BREAKFAST 30 tablet 1  . JANUVIA 100 MG tablet TAKE ONE TABLET BY MOUTH ONCE DAILY 30 tablet 5  . losartan (COZAAR) 100 MG tablet TAKE ONE TABLET BY MOUTH ONCE DAILY 30 tablet 5  . metFORMIN (GLUCOPHAGE-XR) 500 MG 24 hr tablet Take 1 tablet (500 mg total) by mouth 2 (two) times daily. 30 tablet 1  . metoprolol (LOPRESSOR) 100 MG tablet TAKE ONE TABLET BY MOUTH TWICE DAILY 180 tablet 0  . metroNIDAZOLE (METROGEL VAGINAL) 0.75 % vaginal gel 1 application nightly x5 days. 70 g 0  . Naproxen  Sodium 750 MG TB24 Take 750 mg by mouth as needed. with food    . nystatin (NYSTATIN) powder Apply twice daily beneath breasts. 30 g 1  . ONETOUCH DELICA LANCETS FINE MISC Use to check blood sugar 3 times daily 100 each 3  . OVER THE COUNTER MEDICATION Take 1 tablet by mouth daily. POTASSIUM    . rosuvastatin (CRESTOR) 20 MG tablet TAKE 1 TABLET BY MOUTH ONCE DAILY 30 tablet 5  . spironolactone (ALDACTONE) 25 MG tablet Take 2 tablets (50 mg total) by mouth daily. *while 50 mg tablet is on back order* 180 tablet 0  . spironolactone (ALDACTONE) 50 MG tablet Take 1 tablet (50 mg total) by mouth daily. 30 tablet 3   No current facility-administered medications on file prior to visit.     BP (!) 158/95   Pulse 79   Temp 99.4 F (37.4 C) (Oral)   Resp 16   Ht 5\' 2"  (1.575 m)   Wt 218 lb 3.2 oz (99 kg)   LMP 12/15/2016   SpO2 100%   BMI 39.91 kg/m    Objective:   Physical Exam  Constitutional: She appears well-developed and well-nourished.  Cardiovascular: Normal rate, regular rhythm and normal heart sounds.   No murmur heard. Pulmonary/Chest: Effort normal and breath sounds normal. No respiratory distress. She has no wheezes.  Skin:  Superficial abrasion noted right dorsal foot without erythema/drainage. Clean base is noted  Psychiatric: She has a normal mood and affect. Her behavior is normal. Judgment and thought content normal.          Assessment & Plan:  HTN- BP is elevated today. Did not take beta blocker today. Resume, plan to repeat bp in 2 weeks.   Skin abrasion- no obvious sign of infection. Advised pt as follows:  Apply antibiotic ointment once daily to the cut on your foot. Call if you develop redness/swelling or drainage.

## 2017-01-10 NOTE — Patient Instructions (Signed)
Apply antibiotic ointment once daily to the cut on your foot. Call if you develop redness/swelling or drainage.

## 2017-01-24 ENCOUNTER — Encounter: Payer: Self-pay | Admitting: Family

## 2017-01-24 ENCOUNTER — Ambulatory Visit (INDEPENDENT_AMBULATORY_CARE_PROVIDER_SITE_OTHER): Payer: 59 | Admitting: Family

## 2017-01-24 VITALS — BP 144/84 | HR 65 | Temp 98.7°F | Resp 18 | Ht 62.0 in | Wt 218.8 lb

## 2017-01-24 DIAGNOSIS — I10 Essential (primary) hypertension: Secondary | ICD-10-CM | POA: Diagnosis not present

## 2017-01-24 DIAGNOSIS — Z23 Encounter for immunization: Secondary | ICD-10-CM | POA: Diagnosis not present

## 2017-01-24 MED ORDER — CARVEDILOL 25 MG PO TABS: 25.0000 mg | ORAL_TABLET | Freq: Two times a day (BID) | ORAL | 3 refills | Status: DC

## 2017-01-24 NOTE — Patient Instructions (Signed)
Stop metoprolol, start coreg.  Continue other blood pressure medications without change.

## 2017-01-24 NOTE — Progress Notes (Signed)
Subjective:    Patient ID: Susan Whitaker, female    DOB: 10/29/1970, 46 y.o.   MRN: [id number]  HPI  Susan Whitaker is a 46 yr old female who presents today for follow up of her blood pressure.  She is maintained on aldactone 50mg  once daily, losartan 100 mg once daily amlodipine and metoprolol. Reports improved compliance with her medications.  BP Readings from Last 3 Encounters:  01/24/17 (!) 144/84  01/10/17 (!) 158/95  11/23/16 138/82      Review of Systems See HPI  Past Medical History:  Diagnosis Date  . Allergy    allergic rhinitis  . Diabetes mellitus    type II  . FHx: hypertension   . Gastric ulcer   . GERD (gastroesophageal reflux disease)   . H/O constipation   . H/O hemorrhoids   . H/O varicella   . Hyperlipidemia   . Hypertension   . Hypokalemia   . Increased BMI 06/2010  . Primary hyperaldosteronism ([place]) 02/21/2012  . SVT (supraventricular tachycardia) ([place])    Noted 11/2011 admission  . Thrombocytopenia ([place])   . Thyroid fullness 08/2005  . Urinary incontinence      Social History   Social History  . Marital status: Single    Spouse name: N/A  . Number of children: 2  . Years of education: N/A   Occupational History  . 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 Smoking status: Never Smoker  . Smokeless tobacco: Never Used  . Alcohol use No  . Drug use: No  . Sexual activity: Not on file   Other Topics Concern  . Not on file   Social History Narrative   Works at [employer]          Past Surgical History:  Procedure Laterality Date  . ABDOMINAL SURGERY     63 months of age-- unsure of type of surgery  . COLONOSCOPY  04/2016   hemorrhoids--normal per pt with Dr Collene Mares  . HEMORRHOID SURGERY  2005/2006  . UTERINE FIBROID EMBOLIZATION      Family History  Problem Relation Age of Onset  . Cancer Mother        breast  . Stroke Mother   . Cancer Maternal Grandmother        breast  . Hypertension Maternal  Grandmother   . Cancer Other        breast, lung  . Hypertension Other   . Stroke Other   . Vision loss Father   . Heart disease Father        Rhythm disturbance  . Diabetes Neg Hx     Allergies  Allergen Reactions  . Lisinopril     REACTION: ACE cough    Current Outpatient Prescriptions on File Prior to Visit  Medication Sig Dispense Refill  . AMBULATORY NON FORMULARY MEDICATION BORIC ACID CAPSULE 600mg . Insert 1 capsule vaginally two times a week. 8 capsule 5  . amLODipine (NORVASC) 10 MG tablet Take 1 tablet (10 mg total) by mouth daily. 30 tablet 3  . aspirin EC 81 MG tablet Take 1 tablet (81 mg total) by mouth daily.    Marland Kitchen atorvastatin (LIPITOR) 20 MG tablet Take 1 tablet (20 mg total) by mouth daily. 30 tablet 3  . Calcium Carbonate-Vitamin D (CALTRATE 600+D) 600-400 MG-UNIT per tablet Take 1 tablet by mouth 2 (two) times daily. Reported on 10/20/2015    . fluconazole (DIFLUCAN) 150 MG tablet 1 tablet by  mouth today, may repeat in 3 days. 2 tablet 0  . glucose blood (ONETOUCH VERIO) test strip Use to check blood sugar 3 times daily 100 each 3  . INVOKANA 300 MG TABS tablet TAKE 1 TABLET BY MOUTH ONCE DAILY BEFORE BREAKFAST 30 tablet 1  . JANUVIA 100 MG tablet TAKE ONE TABLET BY MOUTH ONCE DAILY 30 tablet 5  . losartan (COZAAR) 100 MG tablet TAKE ONE TABLET BY MOUTH ONCE DAILY 30 tablet 5  . metFORMIN (GLUCOPHAGE-XR) 500 MG 24 hr tablet Take 1 tablet (500 mg total) by mouth 2 (two) times daily. 30 tablet 1  . Naproxen Sodium 750 MG TB24 Take 750 mg by mouth as needed. with food    . nystatin (NYSTATIN) powder Apply twice daily beneath breasts. 30 g 1  . ONETOUCH DELICA LANCETS FINE MISC Use to check blood sugar 3 times daily 100 each 3  . OVER THE COUNTER MEDICATION Take 1 tablet by mouth daily. POTASSIUM    . rosuvastatin (CRESTOR) 20 MG tablet TAKE 1 TABLET BY MOUTH ONCE DAILY 30 tablet 5  . spironolactone (ALDACTONE) 25 MG tablet Take 2 tablets (50 mg total) by mouth daily.  *while 50 mg tablet is on back order* 180 tablet 0  . spironolactone (ALDACTONE) 50 MG tablet Take 1 tablet (50 mg total) by mouth daily. 30 tablet 3   No current facility-administered medications on file prior to visit.     BP (!) 144/84   Pulse 65   Temp 98.7 F (37.1 C) (Oral)   Resp 18   Ht 5\' 2"  (1.575 m)   Wt 218 lb 12.8 oz (99.2 kg)   SpO2 99%   BMI 40.02 kg/m       Objective:   Physical Exam  Constitutional: She is oriented to person, place, and time. She appears well-developed and well-nourished. No distress.  Musculoskeletal: She exhibits no edema.  Neurological: She is alert and oriented to person, place, and time.  Skin: Skin is warm and dry.  Psychiatric: She has a normal mood and affect. Her behavior is normal. Thought content normal.          Assessment & Plan:  HTN- improved. Still slightly above goal of <140/90. First BP reading 137/95, second bp reading 144/84. Will change metoprolol to coreg since coreg is stronger for BP control.

## 2017-01-24 NOTE — Addendum Note (Signed)
Addended by: Kelle Darting A on: 01/24/2017 06:25 PM   Modules accepted: Orders

## 2017-02-18 ENCOUNTER — Other Ambulatory Visit: Payer: Self-pay

## 2017-02-21 ENCOUNTER — Other Ambulatory Visit: Payer: 59

## 2017-02-23 ENCOUNTER — Ambulatory Visit: Payer: Self-pay | Admitting: Endocrinology

## 2017-03-03 ENCOUNTER — Other Ambulatory Visit: Payer: Self-pay | Admitting: Family

## 2017-03-03 ENCOUNTER — Other Ambulatory Visit: Payer: Self-pay | Admitting: Endocrinology

## 2017-03-25 ENCOUNTER — Encounter (HOSPITAL_COMMUNITY): Payer: Self-pay | Admitting: Emergency Medicine

## 2017-03-25 ENCOUNTER — Encounter: Payer: Self-pay | Admitting: Family

## 2017-03-25 ENCOUNTER — Inpatient Hospital Stay (HOSPITAL_COMMUNITY)
Admission: AD | Admit: 2017-03-25 | Discharge: 2017-03-26 | Disposition: A | Discharge status: Home / Self Care | Payer: 59 | Source: Ambulatory Visit | Attending: Obstetrics and Gynecology | Admitting: Obstetrics and Gynecology

## 2017-03-25 DIAGNOSIS — Z7982 Long term (current) use of aspirin: Secondary | ICD-10-CM | POA: Insufficient documentation

## 2017-03-25 DIAGNOSIS — T192XXA Foreign body in vulva and vagina, initial encounter: Secondary | ICD-10-CM | POA: Insufficient documentation

## 2017-03-25 DIAGNOSIS — Z7984 Long term (current) use of oral hypoglycemic drugs: Secondary | ICD-10-CM | POA: Insufficient documentation

## 2017-03-25 NOTE — MAU Provider Note (Signed)
History    Susan Whitaker is a 46y.o. Female who presents to the MAU, unannounced, with c/o foreign body (tampon) in vagina.  Patient states she thinks tampon has been in place for 3 days as she attempted to place another tampon with no success.  Patient states smell also started during that time and she placed a boric acid with no relief of smell.  Patient denies discharge, but is having some blood. No problems with urination and no fever.  Patient states her menses started on Halloween and was heavy for 5 days.  Patient states menses flow then lightened up.  Patient denies other issues.    Patient Active Problem List   Diagnosis Date Noted  . Bacterial vaginosis 07/15/2016  . Blurred vision, bilateral 06/11/2014  . Non-suppurative otitis media 03/28/2014  . Late menses 03/28/2014  . Fatigue 04/13/2013  . Right hip pain 10/31/2012  . Anxiety state 07/14/2012  . Routine general medical examination at a health care facility 07/14/2012  . Primary hyperaldosteronism ([place]) 02/21/2012  . Microalbuminuria 02/15/2012  . Multiple thyroid nodules 01/04/2012  . SVT (supraventricular tachycardia) ([place]) 12/29/2011  . Hypokalemia 11/28/2011  . Cervical pain (neck) 05/28/2011  . EXTERNAL HEMORRHOIDS WITHOUT MENTION COMP 02/07/2009  . Diabetes type 2, controlled ([place]) 02/07/2009  . ALLERGIC RHINITIS 05/24/2008  . Hyperlipemia 04/22/2008  . THROMBOCYTOSIS 04/22/2008  . URINARY INCONTINENCE 04/22/2008  . Essential hypertension 01/04/2007  . GERD 01/04/2007    Chief Complaint  Patient presents with  . Foreign Body in Vagina   HPI  OB History    Gravida Para Term Preterm AB Living   4 2 2   1 2    SAB TAB Ectopic Multiple Live Births   1              Past Medical History:  Diagnosis Date  . Allergy    allergic rhinitis  . Diabetes mellitus    type II  . FHx: hypertension   . Gastric ulcer   . GERD (gastroesophageal reflux disease)   . H/O constipation   . H/O hemorrhoids   . H/O  varicella   . Hyperlipidemia   . Hypertension   . Hypokalemia   . Increased BMI 06/2010  . Primary hyperaldosteronism ([place]) 02/21/2012  . SVT (supraventricular tachycardia) ([place])    Noted 11/2011 admission  . Thrombocytopenia ([place])   . Thyroid fullness 08/2005  . Urinary incontinence     Past Surgical History:  Procedure Laterality Date  . ABDOMINAL SURGERY     14 months of age-- unsure of type of surgery  . COLONOSCOPY  04/2016   hemorrhoids--normal per pt with Dr Collene Mares  . HEMORRHOID SURGERY  2005/2006  . UTERINE FIBROID EMBOLIZATION      Family History  Problem Relation Age of Onset  . Cancer Mother        breast  . Stroke Mother   . Cancer Maternal Grandmother        breast  . Hypertension Maternal Grandmother   . Cancer Other        breast, lung  . Hypertension Other   . Stroke Other   . Vision loss Father   . Heart disease Father        Rhythm disturbance  . Diabetes Neg Hx     Social History   Tobacco Use  . Smoking status: Never Smoker  . Smokeless tobacco: Never Used  Substance Use Topics  . Alcohol use: No  . Drug use: No  Allergies:  Allergies  Allergen Reactions  . Lisinopril     REACTION: ACE cough    Medications Prior to Admission  Medication Sig Dispense Refill Last Dose  . AMBULATORY NON FORMULARY MEDICATION BORIC ACID CAPSULE 600mg . Insert 1 capsule vaginally two times a week. 8 capsule 5 Taking  . amLODipine (NORVASC) 10 MG tablet Take 1 tablet (10 mg total) by mouth daily. 30 tablet 3 Taking  . aspirin EC 81 MG tablet Take 1 tablet (81 mg total) by mouth daily.   Taking  . atorvastatin (LIPITOR) 20 MG tablet TAKE ONE TABLET BY MOUTH ONCE DAILY 90 tablet 1   . Calcium Carbonate-Vitamin D (CALTRATE 600+D) 600-400 MG-UNIT per tablet Take 1 tablet by mouth 2 (two) times daily. Reported on 10/20/2015   Taking  . carvedilol (COREG) 25 MG tablet Take 1 tablet (25 mg total) by mouth 2 (two) times daily with a meal. 60 tablet 3   . fluconazole  (DIFLUCAN) 150 MG tablet 1 tablet by mouth today, may repeat in 3 days. 2 tablet 0 Taking  . glucose blood (ONETOUCH VERIO) test strip Use to check blood sugar 3 times daily 100 each 3 Taking  . INVOKANA 300 MG TABS tablet TAKE 1 TABLET BY MOUTH ONCE DAILY BEFORE BREAKFAST 30 tablet 1   . JANUVIA 100 MG tablet TAKE ONE TABLET BY MOUTH ONCE DAILY 30 tablet 5 Taking  . losartan (COZAAR) 100 MG tablet TAKE ONE TABLET BY MOUTH ONCE DAILY 90 tablet 1   . metFORMIN (GLUCOPHAGE-XR) 500 MG 24 hr tablet Take 1 tablet (500 mg total) by mouth 2 (two) times daily. 30 tablet 1 Taking  . Naproxen Sodium 750 MG TB24 Take 750 mg by mouth as needed. with food   Taking  . nystatin (NYSTATIN) powder Apply twice daily beneath breasts. 30 g 1 Taking  . ONETOUCH DELICA LANCETS FINE MISC Use to check blood sugar 3 times daily 100 each 3 Taking  . OVER THE COUNTER MEDICATION Take 1 tablet by mouth daily. POTASSIUM   Taking  . rosuvastatin (CRESTOR) 20 MG tablet TAKE 1 TABLET BY MOUTH ONCE DAILY 30 tablet 5 Taking  . spironolactone (ALDACTONE) 25 MG tablet TAKE 2 TABLETS BY MOUTH ONCE DAILY 180 tablet 1   . spironolactone (ALDACTONE) 50 MG tablet Take 1 tablet (50 mg total) by mouth daily. 30 tablet 3 Taking    ROS  See HPI Above Physical Exam   Blood pressure (!) 137/53, pulse 79, temperature 98.1 F (36.7 C), temperature source Oral, resp. rate 16, height 5\' 2"  (1.575 m), weight 99.3 kg (219 lb), last menstrual period 03/16/2017, SpO2 99 %.  No results found for this or any previous visit (from the past 24 hour(s)).  Physical Exam  Constitutional: She is oriented to person, place, and time. She appears well-developed and well-nourished. No distress.  HENT:  Head: Normocephalic and atraumatic.  Eyes: Conjunctivae are normal.  Neck: Normal range of motion.  Cardiovascular: Normal rate.  Respiratory: Effort normal.  GI: Soft.  Genitourinary: Cervix exhibits no motion tenderness.  There is a foreign body  (Tampon-Removed w/o incident) in the vagina. Vaginal discharge found.  Genitourinary Comments: Sterile Speculum Exam: -Vaginal Vault: Tampon removed-Grey in appearance.  Vaginal walls pink-no trauma No active bleeding noted.  -Cervix:Unable to visualize due to anteverted uterus -Bimanual Exam: No pain  Musculoskeletal: Normal range of motion.  Neurological: She is alert and oriented to person, place, and time.  Skin: Skin is warm and dry.  ED Course  Assessment: 46y.o. Female Foreign Body in Vagina  Plan: -Foreign object removed without difficulty -Instructed to monitor discharge and temperature and report if symptoms worsen -Okay to use boric acid for vaginal smell -No Q/C -Encouraged to call if q/c arise -Discharged to home in improved condition  [place], MSN 03/25/2017 11:55 PM

## 2017-03-25 NOTE — MAU Note (Signed)
Pt reports she thinks there has been a tampon stuck for the the last 2 days,

## 2017-03-26 DIAGNOSIS — Z7984 Long term (current) use of oral hypoglycemic drugs: Secondary | ICD-10-CM | POA: Diagnosis not present

## 2017-03-26 DIAGNOSIS — Z7982 Long term (current) use of aspirin: Secondary | ICD-10-CM | POA: Diagnosis not present

## 2017-03-26 DIAGNOSIS — T192XXA Foreign body in vulva and vagina, initial encounter: Secondary | ICD-10-CM | POA: Diagnosis not present

## 2017-04-14 ENCOUNTER — Other Ambulatory Visit: Payer: Self-pay | Admitting: Family

## 2017-04-14 ENCOUNTER — Other Ambulatory Visit: Payer: Self-pay | Admitting: Endocrinology

## 2017-04-15 NOTE — Telephone Encounter (Signed)
Melissa-- We received refill requests from pharmacy on atorvastatin, spironolactone 25mg  and metoprolol. Current med list shows that pt was changed to carvedilol at last visit and metoprolol was stopped. There are also 2 different strengths of spironolactone on med list.  Atorvastatin and Crestor are both on current med list. Please advise which medications / doses pt should be taking currently?

## 2017-04-17 NOTE — Telephone Encounter (Signed)
Please ask pt which statin she has been taking.  OK to continue current statin and send refill for that med.  Refill sent for aldactone 25mg .  She should be on coreg and not metoprolol. She was due to follow back up in early October, please ask her to schedule follow up.

## 2017-04-18 NOTE — Telephone Encounter (Signed)
Attempted to reach pt and left message to check mychart account. Message sent.

## 2017-04-28 NOTE — Telephone Encounter (Signed)
Spoke with pt. She confirms that she has been taking crestor (refill sent). Pt states that carvedilol doesn't seem to be helping as much as metoprolol did. Reports that she continued to have palpitations on carvedilol only so she has been taking both. Advised pt not to take both metoprolol and carvedilol together. Scheduled appt with PCP for Friday at 1:40pm.

## 2017-04-28 NOTE — Telephone Encounter (Signed)
Noted  

## 2017-04-29 ENCOUNTER — Other Ambulatory Visit (INDEPENDENT_AMBULATORY_CARE_PROVIDER_SITE_OTHER): Payer: 59

## 2017-04-29 ENCOUNTER — Encounter: Payer: Self-pay | Admitting: Family

## 2017-04-29 ENCOUNTER — Ambulatory Visit: Payer: 59 | Admitting: Family

## 2017-04-29 VITALS — BP 157/72 | HR 66 | Temp 99.1°F | Resp 16 | Ht 62.0 in | Wt 220.0 lb

## 2017-04-29 DIAGNOSIS — E119 Type 2 diabetes mellitus without complications: Secondary | ICD-10-CM | POA: Diagnosis not present

## 2017-04-29 DIAGNOSIS — E785 Hyperlipidemia, unspecified: Secondary | ICD-10-CM

## 2017-04-29 DIAGNOSIS — E1165 Type 2 diabetes mellitus with hyperglycemia: Secondary | ICD-10-CM

## 2017-04-29 DIAGNOSIS — I10 Essential (primary) hypertension: Secondary | ICD-10-CM

## 2017-04-29 LAB — BASIC METABOLIC PANEL
BUN: 23 mg/dL (ref 6–23)
CO2: 30 mEq/L (ref 19–32)
Calcium: 9 mg/dL (ref 8.4–10.5)
Chloride: 100 mEq/L (ref 96–112)
Creatinine, Ser: 0.97 mg/dL (ref 0.40–1.20)
GFR: 78.78 mL/min (ref >60.00)
Glucose, Bld: 157 mg/dL — ABNORMAL HIGH (ref 70–99)
Potassium: 3.4 mEq/L — ABNORMAL LOW (ref 3.5–5.1)
Sodium: 138 mEq/L (ref 135–145)

## 2017-04-29 LAB — HEMOGLOBIN A1C: Hgb A1c MFr Bld: 7.6 % — ABNORMAL HIGH (ref 4.6–6.5)

## 2017-04-29 NOTE — Patient Instructions (Signed)
Check with your pharmacy to make sure your losartan was not recalled.  Then restart. If it has been recalled please send me a message via mychart and I will change your medication.

## 2017-04-29 NOTE — Progress Notes (Signed)
Is

## 2017-04-29 NOTE — Progress Notes (Signed)
Subjective:    Patient ID: Susan Whitaker, female    DOB: 05/19/1970, 46 y.o.   MRN: [id number]  HPI   Pt is a 46 yr old female who presents today for routine follow up.   DM2- stopped [place] due to cost 3 weeks ago. Reports copay card ran out. Now following with endo who performed labs earlier today.   Lab Results  Component Value Date   HGBA1C 7.6 (H) 04/29/2017   HGBA1C 7.1 (H) 11/01/2016   HGBA1C 7.3 (H) 07/09/2016   Lab Results  Component Value Date   MICROALBUR 2.2 (H) 07/09/2016   LDLCALC 103 (H) 11/01/2016   CREATININE 0.97 04/29/2017   HTN- stopped losartan due to concern about recall.  BP Readings from Last 3 Encounters:  04/29/17 (!) 157/72  03/26/17 (!) 137/53  01/24/17 (!) 144/84   Hyperlipidemia-  Lab Results  Component Value Date   CHOL 156 11/01/2016   HDL 37.40 (L) 11/01/2016   LDLCALC 103 (H) 11/01/2016   LDLDIRECT 103.1 03/04/2014   TRIG 77.0 11/01/2016   CHOLHDL 4 11/01/2016      Review of Systems    see HPI  Past Medical History:  Diagnosis Date  . Allergy    allergic rhinitis  . Diabetes mellitus    type II  . FHx: hypertension   . Gastric ulcer   . GERD (gastroesophageal reflux disease)   . H/O constipation   . H/O hemorrhoids   . H/O varicella   . Hyperlipidemia   . Hypertension   . Hypokalemia   . Increased BMI 06/2010  . Primary hyperaldosteronism ([place]) 02/21/2012  . SVT (supraventricular tachycardia) ([place])    Noted 11/2011 admission  . Thrombocytopenia ([place])   . Thyroid fullness 08/2005  . Urinary incontinence      Social History   Socioeconomic History  . Marital status: Single    Spouse name: Not on file  . Number of children: 2  . Years of education: Not on file  . Highest education level: Not on file  Social Needs  . Financial resource strain: Not on file  . Food insecurity - worry: Not on file  . Food insecurity - inability: Not on file  . Transportation needs - medical: Not on file  . Transportation  needs - non-medical: Not on file  Occupational History  . Occupation: YOUTH EMPLOYMENT    Employer: [employer]  Tobacco Use  . Smoking status: Never Smoker  . Smokeless tobacco: Never Used  Substance and Sexual Activity  . Alcohol use: No  . Drug use: No  . Sexual activity: No    Partners: Male    Birth control/protection: None  Other Topics Concern  . Not on file  Social History Narrative   Works at [employer]          Past Surgical History:  Procedure Laterality Date  . ABDOMINAL SURGERY     46 months of age-- unsure of type of surgery  . COLONOSCOPY  04/2016   hemorrhoids--normal per pt with Dr Collene Mares  . HEMORRHOID SURGERY  2005/2006  . UTERINE FIBROID EMBOLIZATION      Family History  Problem Relation Age of Onset  . Cancer Mother        breast  . Stroke Mother   . Cancer Maternal Grandmother        breast  . Hypertension Maternal Grandmother   . Cancer Other        breast, lung  . Hypertension  Other   . Stroke Other   . Vision loss Father   . Heart disease Father        Rhythm disturbance  . Diabetes Neg Hx     Allergies  Allergen Reactions  . Lisinopril     REACTION: ACE cough    Current Outpatient Medications on File Prior to Visit  Medication Sig Dispense Refill  . AMBULATORY NON FORMULARY MEDICATION BORIC ACID CAPSULE 600mg . Insert 1 capsule vaginally two times a week. 8 capsule 5  . amLODipine (NORVASC) 10 MG tablet TAKE 1 TABLET BY MOUTH ONCE DAILY 30 tablet 3  . aspirin EC 81 MG tablet Take 1 tablet (81 mg total) by mouth daily.    Marland Kitchen atorvastatin (LIPITOR) 20 MG tablet TAKE ONE TABLET BY MOUTH ONCE DAILY 90 tablet 1  . Calcium Carbonate-Vitamin D (CALTRATE 600+D) 600-400 MG-UNIT per tablet Take 1 tablet by mouth 2 (two) times daily. Reported on 10/20/2015    . carvedilol (COREG) 25 MG tablet Take 1 tablet (25 mg total) by mouth 2 (two) times daily with a meal. 60 tablet 3  . glucose blood (ONETOUCH VERIO) test strip Use to check blood sugar  3 times daily 100 each 3  . INVOKANA 300 MG TABS tablet TAKE 1 TABLET BY MOUTH ONCE DAILY BEFORE BREAKFAST 30 tablet 1  . JANUVIA 100 MG tablet TAKE ONE TABLET BY MOUTH ONCE DAILY 30 tablet 5  . losartan (COZAAR) 100 MG tablet TAKE ONE TABLET BY MOUTH ONCE DAILY 90 tablet 1  . metFORMIN (GLUCOPHAGE-XR) 500 MG 24 hr tablet TAKE 1 TABLET BY MOUTH TWICE DAILY 30 tablet 1  . Naproxen Sodium 750 MG TB24 Take 750 mg by mouth as needed. with food    . nystatin (NYSTATIN) powder Apply twice daily beneath breasts. 30 g 1  . ONETOUCH DELICA LANCETS FINE MISC Use to check blood sugar 3 times daily 100 each 3  . OVER THE COUNTER MEDICATION Take 1 tablet by mouth daily. POTASSIUM    . rosuvastatin (CRESTOR) 20 MG tablet TAKE 1 TABLET BY MOUTH ONCE DAILY 30 tablet 5  . spironolactone (ALDACTONE) 25 MG tablet TAKE 2 TABLETS BY MOUTH ONCE DAILY 180 tablet 1   No current facility-administered medications on file prior to visit.     BP (!) 157/72 (BP Location: Left Arm, Patient Position: Sitting, Cuff Size: Large)   Pulse 66   Temp 99.1 F (37.3 C) (Oral)   Resp 16   Ht 5\' 2"  (1.575 m)   Wt 220 lb (99.8 kg)   LMP 04/23/2017   SpO2 95%   BMI 40.24 kg/m    Objective:   Physical Exam  Constitutional: She is oriented to person, place, and time. She appears well-developed and well-nourished.  HENT:  Head: Normocephalic and atraumatic.  Cardiovascular: Normal rate, regular rhythm and normal heart sounds.  No murmur heard. Pulmonary/Chest: Effort normal and breath sounds normal. No respiratory distress. She has no wheezes.  Musculoskeletal: She exhibits no edema.  Neurological: She is alert and oriented to person, place, and time.  Psychiatric: She has a normal mood and affect. Her behavior is normal. Judgment and thought content normal.          Assessment & Plan:  DM2- uncontrolled- patient given copay card for [place].  Defer management to  Endo.  HTN-uncontrolled.  Advised patient to  contact her pharmacy to check if her losartan was recalled.  If it has not been recalled advised her to restart this evening.  If it has been called please notify me via my chart and I will send a different prescription.  Hyperlipidemia-stable on statin continue same.

## 2017-05-02 ENCOUNTER — Ambulatory Visit (INDEPENDENT_AMBULATORY_CARE_PROVIDER_SITE_OTHER): Payer: 59 | Admitting: Endocrinology

## 2017-05-02 ENCOUNTER — Encounter: Payer: Self-pay | Admitting: Endocrinology

## 2017-05-02 VITALS — BP 120/80 | HR 81 | Ht 62.0 in | Wt 220.8 lb

## 2017-05-02 DIAGNOSIS — I129 Hypertensive chronic kidney disease with stage 1 through stage 4 chronic kidney disease, or unspecified chronic kidney disease: Secondary | ICD-10-CM | POA: Diagnosis not present

## 2017-05-02 DIAGNOSIS — N189 Chronic kidney disease, unspecified: Secondary | ICD-10-CM | POA: Diagnosis not present

## 2017-05-02 DIAGNOSIS — E1165 Type 2 diabetes mellitus with hyperglycemia: Secondary | ICD-10-CM

## 2017-05-02 DIAGNOSIS — E119 Type 2 diabetes mellitus without complications: Secondary | ICD-10-CM | POA: Diagnosis not present

## 2017-05-02 NOTE — Progress Notes (Signed)
Patient ID: Susan Whitaker, female   DOB: 11/03/1970, 46 y.o.   MRN: [id number]           Reason for Appointment: Follow-up for Type 2 Diabetes  Referring physician:  Debbrah Alar   History of Present Illness:          Date of diagnosis of type 2 diabetes mellitus: 2013        Background history:  She was diagnosed to have diabetes when she was having fibroid surgery. A1c in 2013 was 6.9 She was treated with metformin but she says she was not able to take this because of diarrhea and would be irregular with it However her blood sugars stayed about the same until 2015 when they were higher and glipizide added Her blood sugars had been much higher since about late 2015 with A1c in the 9-10 range with her taking glipizide alone  Recent history:   Non-insulin hypoglycemic drugs the patient is taking are: Invokana 300 mg daily, not on Januvia 100 mg daily, metformin ER 500 mg 2x daily   Her A1c in June was 7.1 and now it is 7.6, has not been seen since 7/18 Previous range 6.7-7.3  Current management, blood sugar patterns and problems identified:  She says she has not been able to get coverage for her Januvia for the last 3 months and she thinks blood sugars are high because of this  She did not bring any blood sugar records and not clear which meter she is using  However she thinks her blood sugars are frequently around 200 in the morning and somewhat increased at night  Her lab glucose was 157  She is however able to continue her 300 mg Invokana  However has not lost weight, has gained 3 pounds  This is partly because of her not doing her walking at work which she was doing 5 days a week  She thinks she is doing better with cutting out a lot of carbohydrates and sweets like grapes, may have a fruit cup at bedtime  Side effects from medications have been: Diarrhea from regular metformin  Compliance with the medical regimen: Fair  Glucose monitoring:   Less than once a  day recently , previously done up to 2 times a day         Glucometer:  FreeStyle      Blood Glucose readings by recall   Mean values apply above for all meters except median for One Touch  PRE-MEAL Fasting Lunch Dinner Bedtime Overall  Glucose range: 127-219   169-180   Mean/median:         Self-care: The diet that the patient has been following is: tries to limit Portions .     Typical meal intake: Breakfast is  fruit, Activia Yogurt,  For snacks she will have Snack box, chicken breast at dinnertime.  Avoiding Sweet drinks                 Dietician visit, most recent: 5/17, has  had diabetes education classes               Exercise: Walking 2/7. 30 min at work   Weight history:  Wt Readings from Last 3 Encounters:  05/02/17 220 lb 12.8 oz (100.2 kg)  04/29/17 220 lb (99.8 kg)  03/25/17 219 lb (99.3 kg)    Glycemic control:   Lab Results  Component Value Date   HGBA1C 7.6 (H) 04/29/2017   HGBA1C 7.1 (H) 11/01/2016  HGBA1C 7.3 (H) 07/09/2016   Lab Results  Component Value Date   MICROALBUR 2.2 (H) 07/09/2016   LDLCALC 103 (H) 11/01/2016   CREATININE 0.97 04/29/2017   Lab Results  Component Value Date   MICRALBCREAT 2.9 07/09/2016    Other active problems are in review of systems    Allergies as of 05/02/2017      Reactions   Lisinopril    REACTION: ACE cough      Medication List        Accurate as of 05/02/17  4:59 PM. Always use your most recent med list.          AMBULATORY NON FORMULARY MEDICATION BORIC ACID CAPSULE 600mg . Insert 1 capsule vaginally two times a week.   amLODipine 10 MG tablet Commonly known as:  NORVASC TAKE 1 TABLET BY MOUTH ONCE DAILY   aspirin EC 81 MG tablet Take 1 tablet (81 mg total) by mouth daily.   atorvastatin 20 MG tablet Commonly known as:  LIPITOR TAKE ONE TABLET BY MOUTH ONCE DAILY   CALTRATE 600+D 600-400 MG-UNIT tablet Generic drug:  Calcium Carbonate-Vitamin D Take 1 tablet by mouth 2 (two) times  daily. Reported on 10/20/2015   carvedilol 25 MG tablet Commonly known as:  COREG Take 1 tablet (25 mg total) by mouth 2 (two) times daily with a meal.   glucose blood test strip Commonly known as:  ONETOUCH VERIO Use to check blood sugar 3 times daily   INVOKANA 300 MG Tabs tablet Generic drug:  canagliflozin TAKE 1 TABLET BY MOUTH ONCE DAILY BEFORE BREAKFAST   JANUVIA 100 MG tablet Generic drug:  sitaGLIPtin TAKE ONE TABLET BY MOUTH ONCE DAILY   losartan 100 MG tablet Commonly known as:  COZAAR TAKE ONE TABLET BY MOUTH ONCE DAILY   metFORMIN 500 MG 24 hr tablet Commonly known as:  GLUCOPHAGE-XR TAKE 1 TABLET BY MOUTH TWICE DAILY   Naproxen Sodium 750 MG Tb24 Take 750 mg by mouth as needed. with food   nystatin powder Commonly known as:  nystatin Apply twice daily beneath breasts.   ONETOUCH DELICA LANCETS FINE Misc Use to check blood sugar 3 times daily   OVER THE COUNTER MEDICATION Take 1 tablet by mouth daily. POTASSIUM   rosuvastatin 20 MG tablet Commonly known as:  CRESTOR TAKE 1 TABLET BY MOUTH ONCE DAILY   spironolactone 25 MG tablet Commonly known as:  ALDACTONE TAKE 2 TABLETS BY MOUTH ONCE DAILY       Allergies:  Allergies  Allergen Reactions  . Lisinopril     REACTION: ACE cough    Past Medical History:  Diagnosis Date  . Allergy    allergic rhinitis  . Diabetes mellitus    type II  . FHx: hypertension   . Gastric ulcer   . GERD (gastroesophageal reflux disease)   . H/O constipation   . H/O hemorrhoids   . H/O varicella   . Hyperlipidemia   . Hypertension   . Hypokalemia   . Increased BMI 06/2010  . Primary hyperaldosteronism ([place]) 02/21/2012  . SVT (supraventricular tachycardia) ([place])    Noted 11/2011 admission  . Thrombocytopenia ([place])   . Thyroid fullness 08/2005  . Urinary incontinence     Past Surgical History:  Procedure Laterality Date  . ABDOMINAL SURGERY     16 months of age-- unsure of type of surgery  . COLONOSCOPY   04/2016   hemorrhoids--normal per pt with Dr Collene Mares  . HEMORRHOID SURGERY  2005/2006  .  UTERINE FIBROID EMBOLIZATION      Family History  Problem Relation Age of Onset  . Cancer Mother        breast  . Stroke Mother   . Cancer Maternal Grandmother        breast  . Hypertension Maternal Grandmother   . Cancer Other        breast, lung  . Hypertension Other   . Stroke Other   . Vision loss Father   . Heart disease Father        Rhythm disturbance  . Diabetes Neg Hx     Social History:  reports that  has never smoked. she has never used smokeless tobacco. She reports that she does not drink alcohol or use drugs.    Review of Systems    Lipid history: LDL 171 At baseline Now taking Lipitor    Lab Results  Component Value Date   CHOL 156 11/01/2016   HDL 37.40 (L) 11/01/2016   LDLCALC 103 (H) 11/01/2016   LDLDIRECT 103.1 03/04/2014   TRIG 77.0 11/01/2016   CHOLHDL 4 11/01/2016           Hypertension: Present for several years. Blood pressure appears to be variable although relatively better today Not monitoring at home     Previously has had mostly low normal or low blood potassium levels and has been treated with Aldactone, losartan, amlodipine and metoprolol Potassium was better but now low again with 50 mg of Aldactone prescribed by her PCP Has not been evaluated for hyperaldosteronism  Not taking potassium supplements currently    BP Readings from Last 3 Encounters:  05/02/17 120/80  04/29/17 (!) 157/72  03/26/17 (!) 137/53    Lab Results  Component Value Date   CREATININE 0.97 04/29/2017   BUN 23 04/29/2017   NA 138 04/29/2017   K 3.4 (L) 04/29/2017   CL 100 04/29/2017   CO2 30 04/29/2017     She has been noted to have a goiter with multiple nodules, stable as of 2015, Does have 1.4 cm nodule in the right side   Most recent foot exam: 7/18  Review of Systems    Lab on 04/29/2017  Component Date Value Ref Range Status  . Sodium  04/29/2017 138  135 - 145 mEq/L Final  . Potassium 04/29/2017 3.4* 3.5 - 5.1 mEq/L Final  . Chloride 04/29/2017 100  96 - 112 mEq/L Final  . CO2 04/29/2017 30  19 - 32 mEq/L Final  . Glucose, Bld 04/29/2017 157* 70 - 99 mg/dL Final  . BUN 04/29/2017 23  6 - 23 mg/dL Final  . Creatinine, Ser 04/29/2017 0.97  0.40 - 1.20 mg/dL Final  . Calcium 04/29/2017 9.0  8.4 - 10.5 mg/dL Final  . GFR 04/29/2017 78.78  >60.00 mL/min Final  . Hgb A1c MFr Bld 04/29/2017 7.6* 4.6 - 6.5 % Final   Glycemic Control Guidelines for People with Diabetes:Non Diabetic:  <6%Goal of Therapy: <7%Additional Action Suggested:  >8%     Physical Examination:  BP 120/80   Pulse 81   Ht 5\' 2"  (1.575 m)   Wt 220 lb 12.8 oz (100.2 kg)   LMP 04/23/2017   SpO2 97%   BMI 40.38 kg/m       ASSESSMENT/PLAN:  Diabetes type 2, uncontrolled with BMI 40   See history of present illness for detailed discussion of current diabetes management, blood sugar patterns and problems identified  She is on a multidrug regimen with, Invokana 300  mg and low-dose metformin ER A1c is still over 7 and slightly higher than usual at 7.6  She thinks her blood sugars are high from not taking Januvia but she also reports readings up to 200 in the morning Also has difficulty losing weight  She is probably better candidate for a GLP-1 drug and Januvia to help with her long-term control and weight loss However since she just got a supply of Januvia she can try for at least the next 3 weeks to see if it helps consistently She does need to exercise more regularly She will bring her blood glucose monitor for review on the next visit in 2 months  HYPERTENSION: She has somewhat better control Potassium still low normal with 50 mg Aldactone Although she probably has hyperaldosteronism since her blood pressure is controlled will not evaluate further, will be difficult to do so now with stopping Aldactone but may consider it if her blood pressure  gets out of control again  Have sent a message to her PCP to consider increasing Aldactone to 100 mg for conserving potassium inconsistent blood pressure control  There are no Patient Instructions on file for this visit.  Elayne Snare 05/02/2017, 4:59 PM   Note: This office note was prepared with Dragon voice recognition system technology. Any transcriptional errors that result from this process are unintentional.

## 2017-05-02 NOTE — Patient Instructions (Signed)
Check blood sugars on waking up  3/7  Also check blood sugars about 2 hours after a meal and do this after different meals by rotation  Recommended blood sugar levels on waking up is 90-130 and about 2 hours after meal is 130-160  Please bring your blood sugar monitor to each visit, thank you  Call if sugar not good in 3 weeks

## 2017-05-03 ENCOUNTER — Telehealth: Payer: Self-pay | Admitting: Family

## 2017-05-03 DIAGNOSIS — E876 Hypokalemia: Secondary | ICD-10-CM

## 2017-05-03 MED ORDER — SPIRONOLACTONE 100 MG PO TABS: 100.0000 mg | ORAL_TABLET | Freq: Every day | ORAL | 2 refills | Status: DC

## 2017-05-03 NOTE — Telephone Encounter (Signed)
Notified pt and she voices understanding. Scheduled nurse visit for 05/12/17 at 10:30am and lab after that. Future order entered.

## 2017-05-03 NOTE — Telephone Encounter (Signed)
Please let pt know that I reviewed her lab work with Dr Dwyane Dee. Potassium still low. I would like her to increase aldactone to 100mg  and have her come for nurse visit for bp check and bmet in 1-2 weeks.  This will help keep her potassium up and control her blood pressure.

## 2017-05-12 ENCOUNTER — Ambulatory Visit: Payer: 59

## 2017-05-12 ENCOUNTER — Other Ambulatory Visit (INDEPENDENT_AMBULATORY_CARE_PROVIDER_SITE_OTHER): Payer: 59

## 2017-05-12 ENCOUNTER — Ambulatory Visit (HOSPITAL_BASED_OUTPATIENT_CLINIC_OR_DEPARTMENT_OTHER)
Admission: RE | Admit: 2017-05-12 | Discharge: 2017-05-12 | Disposition: A | Discharge status: Home / Self Care | Payer: 59 | Source: Ambulatory Visit | Attending: Family Medicine | Admitting: Family Medicine

## 2017-05-12 ENCOUNTER — Ambulatory Visit: Payer: 59 | Admitting: Family Medicine

## 2017-05-12 ENCOUNTER — Encounter: Payer: Self-pay | Admitting: Family Medicine

## 2017-05-12 VITALS — BP 117/70 | HR 82 | Temp 99.2°F | Resp 16 | Ht 62.0 in | Wt 220.6 lb

## 2017-05-12 DIAGNOSIS — E876 Hypokalemia: Secondary | ICD-10-CM

## 2017-05-12 DIAGNOSIS — R05 Cough: Secondary | ICD-10-CM

## 2017-05-12 DIAGNOSIS — R6889 Other general symptoms and signs: Secondary | ICD-10-CM | POA: Diagnosis not present

## 2017-05-12 DIAGNOSIS — R509 Fever, unspecified: Secondary | ICD-10-CM | POA: Diagnosis not present

## 2017-05-12 DIAGNOSIS — R079 Chest pain, unspecified: Secondary | ICD-10-CM | POA: Diagnosis not present

## 2017-05-12 DIAGNOSIS — R059 Cough, unspecified: Secondary | ICD-10-CM

## 2017-05-12 LAB — BASIC METABOLIC PANEL
BUN: 24 mg/dL — ABNORMAL HIGH (ref 6–23)
CO2: 29 mEq/L (ref 19–32)
Calcium: 9.3 mg/dL (ref 8.4–10.5)
Chloride: 100 mEq/L (ref 96–112)
Creatinine, Ser: 0.87 mg/dL (ref 0.40–1.20)
GFR: 89.3 mL/min (ref >60.00)
Glucose, Bld: 119 mg/dL — ABNORMAL HIGH (ref 70–99)
Potassium: 4 mEq/L (ref 3.5–5.1)
Sodium: 136 mEq/L (ref 135–145)

## 2017-05-12 LAB — POCT INFLUENZA A/B
Influenza A, POC: NEGATIVE
Influenza B, POC: NEGATIVE

## 2017-05-12 MED ORDER — OSELTAMIVIR PHOSPHATE 75 MG PO CAPS: 75.0000 mg | ORAL_CAPSULE | Freq: Two times a day (BID) | ORAL | 0 refills | Status: DC

## 2017-05-12 MED ORDER — HYDROCODONE-HOMATROPINE 5-1.5 MG/5ML PO SYRP: 5.0000 mL | ORAL_SOLUTION | Freq: Three times a day (TID) | ORAL | 0 refills | Status: DC | PRN

## 2017-05-12 NOTE — Progress Notes (Signed)
[place] at [facility] [address], [place], [place] [address] [phone]  Date:  05/12/2017   Name:  Susan Whitaker   DOB:  12-31-1970   MRN:  [id number]  PCP:  Debbrah Alar, NP    Chief Complaint: No chief complaint on file.   History of Present Illness:  Susan Whitaker is a 46 y.o. very pleasant female patient who presents with the following:  She is here today with illness- she was recently exposed to an ill family member; her nephew, he may have the flu On 12/24 she noted runny nose, then she developed diarrhea Christmas eve she developed HA, fever, chills, body aches, coughing- these sx continue She is feeling very tired and achy Her chest hurts when she coughs only She still has some diarrhea but is not vomiting   Lab Results  Component Value Date   HGBA1C 7.6 (H) 04/29/2017   BP Readings from Last 3 Encounters:  05/12/17 117/70  05/02/17 120/80  04/29/17 (!) 157/72   She is not on insulin- diabetes is controlled with invokana, januvia, metformin Her blood sugar has been ok during this recent illness   Patient Active Problem List   Diagnosis Date Noted  . Bacterial vaginosis 07/15/2016  . Blurred vision, bilateral 06/11/2014  . Non-suppurative otitis media 03/28/2014  . Late menses 03/28/2014  . Fatigue 04/13/2013  . Right hip pain 10/31/2012  . Anxiety state 07/14/2012  . Routine general medical examination at a health care facility 07/14/2012  . Primary hyperaldosteronism ([place]) 02/21/2012  . Microalbuminuria 02/15/2012  . Multiple thyroid nodules 01/04/2012  . SVT (supraventricular tachycardia) ([place]) 12/29/2011  . Hypokalemia 11/28/2011  . Cervical pain (neck) 05/28/2011  . EXTERNAL HEMORRHOIDS WITHOUT MENTION COMP 02/07/2009  . Diabetes type 2, controlled ([place]) 02/07/2009  . ALLERGIC RHINITIS 05/24/2008  . Hyperlipemia 04/22/2008  . THROMBOCYTOSIS 04/22/2008  . URINARY INCONTINENCE 04/22/2008   . Essential hypertension 01/04/2007  . GERD 01/04/2007    Past Medical History:  Diagnosis Date  . Allergy    allergic rhinitis  . Diabetes mellitus    type II  . FHx: hypertension   . Gastric ulcer   . GERD (gastroesophageal reflux disease)   . H/O constipation   . H/O hemorrhoids   . H/O varicella   . Hyperlipidemia   . Hypertension   . Hypokalemia   . Increased BMI 06/2010  . Primary hyperaldosteronism ([place]) 02/21/2012  . SVT (supraventricular tachycardia) ([name])    Noted 11/2011 admission  . Thrombocytopenia ([place])   . Thyroid fullness 08/2005  . Urinary incontinence     Past Surgical History:  Procedure Laterality Date  . ABDOMINAL SURGERY     91 months of age-- unsure of type of surgery  . COLONOSCOPY  04/2016   hemorrhoids--normal per pt with Dr Collene Mares  . HEMORRHOID SURGERY  2005/2006  . UTERINE FIBROID EMBOLIZATION      Social History   Tobacco Use  . Smoking status: Never Smoker  . Smokeless tobacco: Never Used  Substance Use Topics  . Alcohol use: No  . Drug use: No    Family History  Problem Relation Age of Onset  . Cancer Mother        breast  . Stroke Mother   . Cancer Maternal Grandmother        breast  . Hypertension Maternal Grandmother   . Cancer Other        breast, lung  .  Hypertension Other   . Stroke Other   . Vision loss Father   . Heart disease Father        Rhythm disturbance  . Diabetes Neg Hx     Allergies  Allergen Reactions  . Lisinopril     REACTION: ACE cough    Medication list has been reviewed and updated.  Current Outpatient Medications on File Prior to Visit  Medication Sig Dispense Refill  . AMBULATORY NON FORMULARY MEDICATION BORIC ACID CAPSULE 600mg . Insert 1 capsule vaginally two times a week. 8 capsule 5  . amLODipine (NORVASC) 10 MG tablet TAKE 1 TABLET BY MOUTH ONCE DAILY 30 tablet 3  . aspirin EC 81 MG tablet Take 1 tablet (81 mg total) by mouth daily.    Marland Kitchen atorvastatin (LIPITOR) 20 MG tablet TAKE ONE  TABLET BY MOUTH ONCE DAILY 90 tablet 1  . Calcium Carbonate-Vitamin D (CALTRATE 600+D) 600-400 MG-UNIT per tablet Take 1 tablet by mouth 2 (two) times daily. Reported on 10/20/2015    . carvedilol (COREG) 25 MG tablet Take 1 tablet (25 mg total) by mouth 2 (two) times daily with a meal. 60 tablet 3  . glucose blood (ONETOUCH VERIO) test strip Use to check blood sugar 3 times daily 100 each 3  . INVOKANA 300 MG TABS tablet TAKE 1 TABLET BY MOUTH ONCE DAILY BEFORE BREAKFAST 30 tablet 1  . JANUVIA 100 MG tablet TAKE ONE TABLET BY MOUTH ONCE DAILY 30 tablet 5  . losartan (COZAAR) 100 MG tablet TAKE ONE TABLET BY MOUTH ONCE DAILY 90 tablet 1  . metFORMIN (GLUCOPHAGE-XR) 500 MG 24 hr tablet TAKE 1 TABLET BY MOUTH TWICE DAILY 30 tablet 1  . Naproxen Sodium 750 MG TB24 Take 750 mg by mouth as needed. with food    . nystatin (NYSTATIN) powder Apply twice daily beneath breasts. 30 g 1  . ONETOUCH DELICA LANCETS FINE MISC Use to check blood sugar 3 times daily 100 each 3  . OVER THE COUNTER MEDICATION Take 1 tablet by mouth daily. POTASSIUM    . rosuvastatin (CRESTOR) 20 MG tablet TAKE 1 TABLET BY MOUTH ONCE DAILY 30 tablet 5  . spironolactone (ALDACTONE) 100 MG tablet Take 1 tablet (100 mg total) by mouth daily. 30 tablet 2   No current facility-administered medications on file prior to visit.     Review of Systems:  As per HPI- otherwise negative. No rash    Physical Examination: Vitals:   05/12/17 1132  BP: 117/70  Pulse: 82  Resp: 16  Temp: 99.2 F (37.3 C)  SpO2: 100%   Vitals:   05/12/17 1132  Weight: 220 lb 9.6 oz (100.1 kg)  Height: 5\' 2"  (1.575 m)   Body mass index is 40.35 kg/m. Ideal Body Weight: Weight in (lb) to have BMI = 25: 136.4  GEN: WDWN, NAD, Non-toxic, A & O x 3, coughing and does not appear to feel well but does not appear acutely ill either.  No meningismus HEENT: Atraumatic, Normocephalic. Neck supple. No masses, No LAD.  Bilateral TM wnl, oropharynx normal.   PEERL,EOMI.   Ears and Nose: No external deformity. CV: RRR, No M/G/R. No JVD. No thrill. No extra heart sounds. PULM: CTA B, no wheezes, crackles, rhonchi. No retractions. No resp. distress. No accessory muscle use. ABD: S, NT, ND EXTR: No c/c/e NEURO Normal gait.  PSYCH: Normally interactive. Conversant. Not depressed or anxious appearing.  Calm demeanor.   Dg Chest 2 View  Result Date: 05/12/2017 CLINICAL  DATA:  Cough, chest congestion, chest pain, and fever for the past 4 days. EXAM: CHEST  2 VIEW COMPARISON:  PA and lateral chest x-ray of April 01, 2014. FINDINGS: The lungs are adequately inflated. There is no focal infiltrate. There is no pleural effusion. The heart and pulmonary vascularity are normal. The mediastinum is normal in width. The bony thorax is unremarkable. IMPRESSION: There is no active cardiopulmonary disease. Electronically Signed   By: David  Martinique M.D.   On: 05/12/2017 12:16    Assessment and Plan: Flu-like symptoms - Plan: oseltamivir (TAMIFLU) 75 MG capsule  Cough - Plan: DG Chest 2 View, HYDROcodone-homatropine (HYCODAN) 5-1.5 MG/5ML syrup  Fever, unspecified fever cause - Plan: DG Chest 2 View  Here today with suspected flu Rapid flu is negative but her sx are consistent with flu and CXR is negative for pneumonia Will treat with tamiflu for 5 days, hycodan to use as needed for cough Asked pt to seek care if not improving in the next few days, and to seek care right away if getting worse.  Her daughter is here and will help to take care of her   Signed Lamar Blinks, MD

## 2017-05-12 NOTE — Patient Instructions (Signed)
It was good to see you today- I am sorry that you are sick however!  You likely have the flu We are going to treat you with tamiflu and hycodan syrup for cough Rest, drink plenty of fluids Please let me know if you are not feeling better in the next few days- Sooner if worse.  Remember the hycodan can make you feel drowsy so do you use it when you need to drive

## 2017-05-13 ENCOUNTER — Encounter: Payer: Self-pay | Admitting: Family

## 2017-05-14 ENCOUNTER — Other Ambulatory Visit: Payer: Self-pay | Admitting: Endocrinology

## 2017-05-16 ENCOUNTER — Telehealth: Payer: Self-pay | Admitting: Family Medicine

## 2017-05-16 DIAGNOSIS — R05 Cough: Secondary | ICD-10-CM

## 2017-05-16 DIAGNOSIS — R059 Cough, unspecified: Secondary | ICD-10-CM

## 2017-05-16 MED ORDER — BENZONATATE 100 MG PO CAPS: 100.0000 mg | ORAL_CAPSULE | Freq: Three times a day (TID) | ORAL | 0 refills | Status: DC | PRN

## 2017-05-16 NOTE — Telephone Encounter (Signed)
Called her- most of her sx are better but she is still coughing. Will extend her work note for her to return on 1/2 Will also rx tessalon perles for her

## 2017-05-16 NOTE — Telephone Encounter (Signed)
Copied from [place] [id number]. Topic: Quick Communication - See Telephone Encounter >> May 16, 2017 11:40 AM [name], NT wrote: CRM for notification. See Telephone encounter for: Pt is calling to have Dr. Lorelei Pont to extended her out of work note because she will not be able to work today. Pt needs letter done by end of day 05/18/17.    05/16/17.

## 2017-05-17 DIAGNOSIS — E876 Hypokalemia: Secondary | ICD-10-CM

## 2017-05-17 HISTORY — DX: Hypokalemia: E87.6

## 2017-05-17 LAB — HM COLONOSCOPY

## 2017-05-20 ENCOUNTER — Telehealth: Payer: Self-pay | Admitting: Endocrinology

## 2017-05-20 NOTE — Telephone Encounter (Signed)
-----   Message from Elayne Snare, MD sent at 05/14/2017  2:51 PM EST ----- Regarding: Appointment needed She needs follow-up appointment in middle February with labs

## 2017-05-20 NOTE — Telephone Encounter (Signed)
LM for pt to call back to schedule appt.

## 2017-05-30 ENCOUNTER — Ambulatory Visit: Payer: Self-pay | Admitting: Family

## 2017-05-30 DIAGNOSIS — Z0289 Encounter for other administrative examinations: Secondary | ICD-10-CM

## 2017-06-13 ENCOUNTER — Encounter: Payer: Self-pay | Admitting: Family

## 2017-06-13 ENCOUNTER — Ambulatory Visit (INDEPENDENT_AMBULATORY_CARE_PROVIDER_SITE_OTHER): Payer: 59 | Admitting: Family

## 2017-06-13 VITALS — BP 136/63 | HR 71 | Temp 98.6°F | Resp 16 | Ht 62.0 in | Wt 226.0 lb

## 2017-06-13 DIAGNOSIS — I10 Essential (primary) hypertension: Secondary | ICD-10-CM

## 2017-06-13 DIAGNOSIS — E785 Hyperlipidemia, unspecified: Secondary | ICD-10-CM | POA: Diagnosis not present

## 2017-06-13 DIAGNOSIS — E1165 Type 2 diabetes mellitus with hyperglycemia: Secondary | ICD-10-CM | POA: Diagnosis not present

## 2017-06-13 MED ORDER — ROSUVASTATIN CALCIUM 20 MG PO TABS: 20.0000 mg | ORAL_TABLET | Freq: Every day | ORAL | 1 refills | Status: DC

## 2017-06-13 NOTE — Progress Notes (Signed)
Subjective:    Patient ID: Susan Whitaker, female    DOB: 12-16-1970, 47 y.o.   MRN: [id number]  HPI   Susan Whitaker is a 47 yr old female who presents today for follow up.  1) HTN- maintained on amlodipine, aldactone, coreg. Denies cp/sob or swelling.  BP Readings from Last 3 Encounters:  06/13/17 136/63  05/12/17 117/70  05/02/17 120/80   2) DM2- Continues with Dr. Dwyane Dee- will schedule an appointment. Lab Results  Component Value Date   HGBA1C 7.6 (H) 04/29/2017   HGBA1C 7.1 (H) 11/01/2016   HGBA1C 7.3 (H) 07/09/2016   Lab Results  Component Value Date   MICROALBUR 2.2 (H) 07/09/2016   LDLCALC 103 (H) 11/01/2016   CREATININE 0.87 05/12/2017    3) Hyperlipidemia- continues atorvastatin.  Lab Results  Component Value Date   CHOL 156 11/01/2016   HDL 37.40 (L) 11/01/2016   LDLCALC 103 (H) 11/01/2016   LDLDIRECT 103.1 03/04/2014   TRIG 77.0 11/01/2016   CHOLHDL 4 11/01/2016      Review of Systems See HPI  Past Medical History:  Diagnosis Date  . Allergy    allergic rhinitis  . Diabetes mellitus    type II  . FHx: hypertension   . Gastric ulcer   . GERD (gastroesophageal reflux disease)   . H/O constipation   . H/O hemorrhoids   . H/O varicella   . Hyperlipidemia   . Hypertension   . Hypokalemia   . Increased BMI 06/2010  . Primary hyperaldosteronism ([place]) 02/21/2012  . SVT (supraventricular tachycardia) ([place])    Noted 11/2011 admission  . Thrombocytopenia ([place])   . Thyroid fullness 08/2005  . Urinary incontinence      Social History   Socioeconomic History  . Marital status: Single    Spouse name: Not on file  . Number of children: 2  . Years of education: Not on file  . Highest education level: Not on file  Social Needs  . Financial resource strain: Not on file  . Food insecurity - worry: Not on file  . Food insecurity - inability: Not on file  . Transportation needs - medical: Not on file  . Transportation needs - non-medical: Not on file    Occupational History  . Occupation: YOUTH EMPLOYMENT    Employer: [employer]  Tobacco Use  . Smoking status: Never Smoker  . Smokeless tobacco: Never Used  Substance and Sexual Activity  . Alcohol use: No  . Drug use: No  . Sexual activity: No    Partners: Male    Birth control/protection: None  Other Topics Concern  . Not on file  Social History Narrative   Works at [employer]          Past Surgical History:  Procedure Laterality Date  . ABDOMINAL SURGERY     40 months of age-- unsure of type of surgery  . COLONOSCOPY  04/2016   hemorrhoids--normal per pt with Dr Collene Mares  . HEMORRHOID SURGERY  2005/2006  . UTERINE FIBROID EMBOLIZATION      Family History  Problem Relation Age of Onset  . Cancer Mother        breast  . Stroke Mother   . Cancer Maternal Grandmother        breast  . Hypertension Maternal Grandmother   . Cancer Other        breast, lung  . Hypertension Other   . Stroke Other   . Vision loss Father   .  Heart disease Father        Rhythm disturbance  . Diabetes Neg Hx     Allergies  Allergen Reactions  . Lisinopril     REACTION: ACE cough    Current Outpatient Medications on File Prior to Visit  Medication Sig Dispense Refill  . AMBULATORY NON FORMULARY MEDICATION BORIC ACID CAPSULE 600mg . Insert 1 capsule vaginally two times a week. 8 capsule 5  . amLODipine (NORVASC) 10 MG tablet TAKE 1 TABLET BY MOUTH ONCE DAILY 30 tablet 3  . aspirin EC 81 MG tablet Take 1 tablet (81 mg total) by mouth daily.    Marland Kitchen atorvastatin (LIPITOR) 20 MG tablet TAKE ONE TABLET BY MOUTH ONCE DAILY 90 tablet 1  . Calcium Carbonate-Vitamin D (CALTRATE 600+D) 600-400 MG-UNIT per tablet Take 1 tablet by mouth 2 (two) times daily. Reported on 10/20/2015    . carvedilol (COREG) 25 MG tablet Take 1 tablet (25 mg total) by mouth 2 (two) times daily with a meal. 60 tablet 3  . glucose blood (ONETOUCH VERIO) test strip Use to check blood sugar 3 times daily 100 each 3  .  INVOKANA 300 MG TABS tablet TAKE 1 TABLET BY MOUTH ONCE DAILY BEFORE BREAKFAST 30 tablet 1  . JANUVIA 100 MG tablet TAKE ONE TABLET BY MOUTH ONCE DAILY 30 tablet 5  . losartan (COZAAR) 100 MG tablet TAKE ONE TABLET BY MOUTH ONCE DAILY 90 tablet 1  . metFORMIN (GLUCOPHAGE-XR) 500 MG 24 hr tablet TAKE 1 TABLET BY MOUTH TWICE DAILY 30 tablet 1  . nystatin (NYSTATIN) powder Apply twice daily beneath breasts. 30 g 1  . ONETOUCH DELICA LANCETS FINE MISC Use to check blood sugar 3 times daily 100 each 3  . spironolactone (ALDACTONE) 100 MG tablet Take 1 tablet (100 mg total) by mouth daily. 30 tablet 2   No current facility-administered medications on file prior to visit.     BP 136/63 (BP Location: Left Arm, Patient Position: Sitting, Cuff Size: Large)   Pulse 71   Temp 98.6 F (37 C) (Oral)   Resp 16   Ht 5\' 2"  (1.575 m)   Wt 226 lb (102.5 kg)   LMP 05/17/2017   SpO2 96%   BMI 41.34 kg/m       Objective:   Physical Exam  Constitutional: She is oriented to person, place, and time. She appears well-developed and well-nourished.  Cardiovascular: Normal rate, regular rhythm and normal heart sounds.  No murmur heard. Pulmonary/Chest: Effort normal and breath sounds normal. No respiratory distress. She has no wheezes.  Neurological: She is alert and oriented to person, place, and time.  Psychiatric: She has a normal mood and affect. Her behavior is normal. Judgment and thought content normal.          Assessment & Plan:  HTN-  bp stable on current meds.  Continue same  Hyperlipidemia-tolerating statin.  LDL nearly at goal.  Continue same.  Lab Results  Component Value Date   CHOL 156 11/01/2016   HDL 37.40 (L) 11/01/2016   LDLCALC 103 (H) 11/01/2016   LDLDIRECT 103.1 03/04/2014   TRIG 77.0 11/01/2016   CHOLHDL 4 11/01/2016   Diabetes type 2-management per endocrinology.  Fair control.

## 2017-06-25 ENCOUNTER — Other Ambulatory Visit: Payer: Self-pay | Admitting: Family

## 2017-06-25 ENCOUNTER — Other Ambulatory Visit: Payer: Self-pay | Admitting: Endocrinology

## 2017-06-27 ENCOUNTER — Telehealth: Payer: Self-pay | Admitting: Endocrinology

## 2017-06-27 NOTE — Telephone Encounter (Signed)
Patient needs script for Susan Whitaker & Susan Whitaker sent to [facility] on [facility] is out of all three meds

## 2017-06-27 NOTE — Telephone Encounter (Signed)
Please advise if you want to continue her Januvia since she asked for it. I saw your note but also saw that this was last filled by Dr. Inda Castle.

## 2017-06-27 NOTE — Telephone Encounter (Signed)
She can continue Januvia for now, she needs to make sure she keeps her appointment next month

## 2017-06-27 NOTE — Telephone Encounter (Signed)
Januvia and Metformin were filled already Januvia by a Dr. Conley Canal.

## 2017-07-06 ENCOUNTER — Other Ambulatory Visit: Payer: Self-pay

## 2017-07-06 ENCOUNTER — Ambulatory Visit: Payer: 59 | Admitting: Emergency Medicine

## 2017-07-06 ENCOUNTER — Encounter: Payer: Self-pay | Admitting: Emergency Medicine

## 2017-07-06 VITALS — BP 130/64 | HR 77 | Temp 98.6°F | Resp 16 | Ht 62.0 in | Wt 219.0 lb

## 2017-07-06 DIAGNOSIS — L0390 Cellulitis, unspecified: Secondary | ICD-10-CM | POA: Insufficient documentation

## 2017-07-06 DIAGNOSIS — W548XXA Other contact with dog, initial encounter: Secondary | ICD-10-CM | POA: Insufficient documentation

## 2017-07-06 MED ORDER — CEPHALEXIN 500 MG PO CAPS: 500.0000 mg | ORAL_CAPSULE | Freq: Three times a day (TID) | ORAL | 0 refills | Status: AC

## 2017-07-06 MED ORDER — MUPIROCIN 2 % EX OINT: TOPICAL_OINTMENT | CUTANEOUS | 1 refills | Status: DC

## 2017-07-06 NOTE — Patient Instructions (Addendum)
     IF you received an x-ray today, you will receive an invoice from [facility]. Please contact [facility] at [phone] with questions or concerns regarding your invoice.   IF you received labwork today, you will receive an invoice from [facility]. Please contact [facility] at [phone] with questions or concerns regarding your invoice.   Our billing staff will not be able to assist you with questions regarding bills from these companies.  You will be contacted with the lab results as soon as they are available. The fastest way to get your results is to activate your My Chart account. Instructions are located on the last page of this paperwork. If you have not heard from us regarding the results in 2 weeks, please contact this office.      Cellulitis, Adult Cellulitis is a skin infection. The infected area is usually red and sore. This condition occurs most often in the arms and lower legs. It is very important to get treated for this condition. Follow these instructions at home:  Take over-the-counter and prescription medicines only as told by your doctor.  If you were prescribed an antibiotic medicine, take it as told by your doctor. Do not stop taking the antibiotic even if you start to feel better.  Drink enough fluid to keep your pee (urine) clear or pale yellow.  Do not touch or rub the infected area.  Raise (elevate) the infected area above the level of your heart while you are sitting or lying down.  Place warm or cold wet cloths (warm or cold compresses) on the infected area. Do this as told by your doctor.  Keep all follow-up visits as told by your doctor. This is important. These visits let your doctor make sure your infection is not getting worse. Contact a doctor if:  You have a fever.  Your symptoms do not get better after 1-2 days of treatment.  Your bone or joint under the infected area starts to hurt after the skin has healed.  Your  infection comes back. This can happen in the same area or another area.  You have a swollen bump in the infected area.  You have new symptoms.  You feel ill and also have muscle aches and pains. Get help right away if:  Your symptoms get worse.  You feel very sleepy.  You throw up (vomit) or have watery poop (diarrhea) for a long time.  There are red streaks coming from the infected area.  Your red area gets larger.  Your red area turns darker. This information is not intended to replace advice given to you by your health care provider. Make sure you discuss any questions you have with your health care provider. Document Released: 10/20/2007 Document Revised: 10/09/2015 Document Reviewed: 03/12/2015 Elsevier Interactive Patient Education  2018 [employer]  

## 2017-07-06 NOTE — Progress Notes (Signed)
Susan Whitaker 47 y.o.   Chief Complaint  Patient presents with  . Rash    LEFT SIDE WITH ITCHING X 4 DAYS    HISTORY OF PRESENT ILLNESS: This is a 47 y.o. female complaining of infected dog scratch to right inguinal area that started 4 days ago.  Diabetic.  Local care not working.  HPI   Prior to Admission medications   Medication Sig Start Date End Date Taking? Authorizing Provider  AMBULATORY NON FORMULARY MEDICATION BORIC ACID CAPSULE 600mg . Insert 1 capsule vaginally two times a week. 07/13/16  Yes Debbrah Alar, NP  amLODipine (NORVASC) 10 MG tablet TAKE 1 TABLET BY MOUTH ONCE DAILY 04/28/17  Yes Debbrah Alar, NP  aspirin EC 81 MG tablet Take 1 tablet (81 mg total) by mouth daily. 08/11/15  Yes Debbrah Alar, NP  atorvastatin (LIPITOR) 20 MG tablet TAKE ONE TABLET BY MOUTH ONCE DAILY 03/04/17  Yes Debbrah Alar, NP  Calcium Carbonate-Vitamin D (CALTRATE 600+D) 600-400 MG-UNIT per tablet Take 1 tablet by mouth 2 (two) times daily. Reported on 10/20/2015   Yes [provider]  carvedilol (COREG) 25 MG tablet Take 1 tablet (25 mg total) by mouth 2 (two) times daily with a meal. 01/24/17  Yes Debbrah Alar, NP  INVOKANA 300 MG TABS tablet TAKE 1 TABLET BY MOUTH ONCE DAILY BEFORE BREAKFAST 05/14/17  Yes Elayne Snare, MD  JANUVIA 100 MG tablet TAKE 1 TABLET BY MOUTH ONCE DAILY 06/27/17  Yes Debbrah Alar, NP  losartan (COZAAR) 100 MG tablet TAKE ONE TABLET BY MOUTH ONCE DAILY 03/04/17  Yes Debbrah Alar, NP  metFORMIN (GLUCOPHAGE-XR) 500 MG 24 hr tablet TAKE 1 TABLET BY MOUTH TWICE DAILY 06/25/17  Yes Elayne Snare, MD  metoprolol tartrate (LOPRESSOR) 100 MG tablet TAKE 1 TABLET BY MOUTH TWICE DAILY 06/27/17  Yes Debbrah Alar, NP  nystatin (NYSTATIN) powder Apply twice daily beneath breasts. 03/24/16  Yes Debbrah Alar, NP  rosuvastatin (CRESTOR) 20 MG tablet Take 1 tablet (20 mg total) by mouth daily. 06/13/17  Yes Debbrah Alar,  NP  spironolactone (ALDACTONE) 100 MG tablet Take 1 tablet (100 mg total) by mouth daily. 05/03/17  Yes Debbrah Alar, NP  glucose blood (ONETOUCH VERIO) test strip Use to check blood sugar 3 times daily 09/28/16   Elayne Snare, MD  [facility] DELICA LANCETS FINE MISC Use to check blood sugar 3 times daily 09/28/16   Elayne Snare, MD    Allergies  Allergen Reactions  . Lisinopril     REACTION: ACE cough    Patient Active Problem List   Diagnosis Date Noted  . Bacterial vaginosis 07/15/2016  . Blurred vision, bilateral 06/11/2014  . Non-suppurative otitis media 03/28/2014  . Late menses 03/28/2014  . Fatigue 04/13/2013  . Right hip pain 10/31/2012  . Anxiety state 07/14/2012  . Routine general medical examination at a health care facility 07/14/2012  . Primary hyperaldosteronism ([place]) 02/21/2012  . Microalbuminuria 02/15/2012  . Multiple thyroid nodules 01/04/2012  . SVT (supraventricular tachycardia) ([place]) 12/29/2011  . Hypokalemia 11/28/2011  . Cervical pain (neck) 05/28/2011  . EXTERNAL HEMORRHOIDS WITHOUT MENTION COMP 02/07/2009  . Diabetes type 2, controlled ([place]) 02/07/2009  . ALLERGIC RHINITIS 05/24/2008  . Hyperlipemia 04/22/2008  . THROMBOCYTOSIS 04/22/2008  . URINARY INCONTINENCE 04/22/2008  . Essential hypertension 01/04/2007  . GERD 01/04/2007    Past Medical History:  Diagnosis Date  . Allergy    allergic rhinitis  . Diabetes mellitus    type II  . FHx: hypertension   .  Gastric ulcer   . GERD (gastroesophageal reflux disease)   . H/O constipation   . H/O hemorrhoids   . H/O varicella   . Hyperlipidemia   . Hypertension   . Hypokalemia   . Increased BMI 06/2010  . Primary hyperaldosteronism ([place]) 02/21/2012  . SVT (supraventricular tachycardia) ([place])    Noted 11/2011 admission  . Thrombocytopenia ([place])   . Thyroid fullness 08/2005  . Urinary incontinence     Past Surgical History:  Procedure Laterality Date  . ABDOMINAL SURGERY     6 months of  age-- unsure of type of surgery  . COLONOSCOPY  04/2016   hemorrhoids--normal per pt with Dr Collene Mares  . HEMORRHOID SURGERY  2005/2006  . UTERINE FIBROID EMBOLIZATION      Social History   Socioeconomic History  . Marital status: Single    Spouse name: Not on file  . Number of children: 2  . Years of education: Not on file  . Highest education level: Not on file  Social Needs  . Financial resource strain: Not on file  . Food insecurity - worry: Not on file  . Food insecurity - inability: Not on file  . Transportation needs - medical: Not on file  . Transportation needs - non-medical: Not on file  Occupational History  . Occupation: YOUTH EMPLOYMENT    Employer: [employer]  Tobacco Use  . Smoking status: Never Smoker  . Smokeless tobacco: Never Used  Substance and Sexual Activity  . Alcohol use: No  . Drug use: No  . Sexual activity: No    Partners: Male    Birth control/protection: None  Other Topics Concern  . Not on file  Social History Narrative   Works at [employer]          Family History  Problem Relation Age of Onset  . Cancer Mother        breast  . Stroke Mother   . Cancer Maternal Grandmother        breast  . Hypertension Maternal Grandmother   . Cancer Other        breast, lung  . Hypertension Other   . Stroke Other   . Vision loss Father   . Heart disease Father        Rhythm disturbance  . Diabetes Neg Hx      Review of Systems  Constitutional: Negative.  Negative for chills and fever.  HENT: Negative for sore throat.   Respiratory: Negative for shortness of breath.   Cardiovascular: Negative for chest pain.  Gastrointestinal: Negative for diarrhea, nausea and vomiting.  Skin: Positive for itching and rash.  Neurological: Negative.  Negative for dizziness and headaches.  Endo/Heme/Allergies: Negative.   All other systems reviewed and are negative.   Vitals:   07/06/17 1749  BP: 130/64  Pulse: 77  Resp: 16  Temp: 98.6 F (37  C)  SpO2: 95%    Physical Exam  Constitutional: She appears well-developed and well-nourished.  HENT:  Head: Normocephalic and atraumatic.  Eyes: EOM are normal. Pupils are equal, round, and reactive to light.  Neck: Normal range of motion.  Cardiovascular: Normal rate.  Pulmonary/Chest: Effort normal.  Musculoskeletal: Normal range of motion.  Skin: Capillary refill takes less than 2 seconds.  Positive infected rash to right inguinal area.  Psychiatric: She has a normal mood and affect. Her behavior is normal.  Vitals reviewed.    ASSESSMENT & PLAN: Phala was seen today for rash.  Diagnoses and  all orders for this visit:  Dog scratch  Cellulitis, unspecified cellulitis site Comments: Right inguinal area Orders: -     cephALEXin (KEFLEX) 500 MG capsule; Take 1 capsule (500 mg total) by mouth 3 (three) times daily for 7 days. -     mupirocin ointment (BACTROBAN) 2 %; Sig to affected area twice a day x 7 days.    Patient Instructions       IF you received an x-ray today, you will receive an invoice from [facility]. Please contact [facility] at [phone] with questions or concerns regarding your invoice.   IF you received labwork today, you will receive an invoice from [place]. Please contact [facility] at [phone] with questions or concerns regarding your invoice.   Our billing staff will not be able to assist you with questions regarding bills from these companies.  You will be contacted with the lab results as soon as they are available. The fastest way to get your results is to activate your My Chart account. Instructions are located on the last page of this paperwork. If you have not heard from [place] regarding the results in 2 weeks, please contact this office.     Cellulitis, Adult Cellulitis is a skin infection. The infected area is usually red and sore. This condition occurs most often in the arms and lower legs. It is very important to  get treated for this condition. Follow these instructions at home:  Take over-the-counter and prescription medicines only as told by your doctor.  If you were prescribed an antibiotic medicine, take it as told by your doctor. Do not stop taking the antibiotic even if you start to feel better.  Drink enough fluid to keep your pee (urine) clear or pale yellow.  Do not touch or rub the infected area.  Raise (elevate) the infected area above the level of your heart while you are sitting or lying down.  Place warm or cold wet cloths (warm or cold compresses) on the infected area. Do this as told by your doctor.  Keep all follow-up visits as told by your doctor. This is important. These visits let your doctor make sure your infection is not getting worse. Contact a doctor if:  You have a fever.  Your symptoms do not get better after 1-2 days of treatment.  Your bone or joint under the infected area starts to hurt after the skin has healed.  Your infection comes back. This can happen in the same area or another area.  You have a swollen bump in the infected area.  You have new symptoms.  You feel ill and also have muscle aches and pains. Get help right away if:  Your symptoms get worse.  You feel very sleepy.  You throw up (vomit) or have watery poop (diarrhea) for a long time.  There are red streaks coming from the infected area.  Your red area gets larger.  Your red area turns darker. This information is not intended to replace advice given to you by your health care provider. Make sure you discuss any questions you have with your health care provider. Document Released: 10/20/2007 Document Revised: 10/09/2015 Document Reviewed: 03/12/2015 Elsevier Interactive Patient Education  2018 Elsevier Inc.      Agustina Caroli, MD [facility]

## 2017-07-18 ENCOUNTER — Encounter: Payer: Self-pay | Admitting: Family

## 2017-07-18 ENCOUNTER — Other Ambulatory Visit: Payer: Self-pay | Admitting: Family

## 2017-07-18 MED ORDER — FLUCONAZOLE 150 MG PO TABS: 150.0000 mg | ORAL_TABLET | Freq: Once | ORAL | 0 refills | Status: AC

## 2017-07-20 ENCOUNTER — Telehealth: Payer: Self-pay

## 2017-07-20 NOTE — Telephone Encounter (Signed)
I can't see the CRM.  Can you please resend?

## 2017-07-20 NOTE — Telephone Encounter (Signed)
Copied from [place] [id number]. Topic: General - Other >> Jul 15, 2017  9:31 AM Marin Olp L wrote: Reason for CRM: Patient needs a letter for her work with [employer] from [place] explaining that sometimes her metformin causes her to have diarrhea and end up running late for work. Patient would also like the note to explain that she is diabetic. Please call her to let her know if this note can be written and when she can pick it up. The number for Medical Services at her job is [phone] if Inda Castle has any questions or concerns.

## 2017-07-20 NOTE — Telephone Encounter (Signed)
Please advise on crm.

## 2017-07-21 ENCOUNTER — Encounter: Payer: Self-pay | Admitting: Family

## 2017-07-21 NOTE — Telephone Encounter (Signed)
See letter.

## 2017-07-22 NOTE — Telephone Encounter (Addendum)
Spoke with pt regarding 2nd letter as it gives a more detailed explanation of medication side effect. Pt states her employer accepted the first letter without any problem but pt would still like to pick up 2nd letter at her upcoming appointment on Monday.  Pt walked in to the office on 07/18/17 and was given below information. She also requested copy of medication list and information was given.

## 2017-07-25 ENCOUNTER — Ambulatory Visit (INDEPENDENT_AMBULATORY_CARE_PROVIDER_SITE_OTHER): Payer: 59 | Admitting: Family

## 2017-07-25 ENCOUNTER — Encounter: Payer: Self-pay | Admitting: Family

## 2017-07-25 VITALS — BP 130/71 | HR 64 | Temp 99.0°F | Resp 16 | Ht 62.5 in | Wt 219.0 lb

## 2017-07-25 DIAGNOSIS — Z23 Encounter for immunization: Secondary | ICD-10-CM | POA: Diagnosis not present

## 2017-07-25 DIAGNOSIS — Z0000 Encounter for general adult medical examination without abnormal findings: Secondary | ICD-10-CM

## 2017-07-25 NOTE — Progress Notes (Signed)
Subjective:    Patient ID: Susan Whitaker, female    DOB: May 11, 1971, 47 y.o.   MRN: [id number]  HPI  Patient presents today for complete physical.  Immunizations: tdap 2014, flu shot 2018 Diet: reports diet is fair Wt Readings from Last 3 Encounters:  07/25/17 219 lb (99.3 kg)  07/06/17 219 lb (99.3 kg)  06/13/17 226 lb (102.5 kg)  Exercise: not exercising regular Pap Smear:  03/24/16 Mammogram: 05/18/16 Vision: due, pt will schedule Dental: due, pt will schedule   Review of Systems  Constitutional: Negative for unexpected weight change.  HENT: Negative for rhinorrhea.   Eyes: Negative for visual disturbance.  Respiratory: Negative for cough.   Cardiovascular: Negative for leg swelling.  Gastrointestinal: Negative for blood in stool, constipation and diarrhea.  Genitourinary: Negative for hematuria.       Reports menses heavy/+ cramping  Musculoskeletal: Negative for arthralgias and myalgias.  Skin: Negative for rash.       Recent dog scratch- resolved  Neurological: Negative for headaches.  Hematological: Negative for adenopathy.  Psychiatric/Behavioral:       Denies depression/anxiety   Past Medical History:  Diagnosis Date  . Allergy    allergic rhinitis  . Diabetes mellitus    type II  . FHx: hypertension   . Gastric ulcer   . GERD (gastroesophageal reflux disease)   . H/O constipation   . H/O hemorrhoids   . H/O varicella   . Hyperlipidemia   . Hypertension   . Hypokalemia   . Increased BMI 06/2010  . Primary hyperaldosteronism ([place]) 02/21/2012  . SVT (supraventricular tachycardia) ([place])    Noted 11/2011 admission  . Thrombocytopenia ([place])   . Thyroid fullness 08/2005  . Urinary incontinence      Social History   Socioeconomic History  . Marital status: Single    Spouse name: Not on file  . Number of children: 2  . Years of education: Not on file  . Highest education level: Not on file  Social Needs  . Financial resource strain: Not on file  .  Food insecurity - worry: Not on file  . Food insecurity - inability: Not on file  . Transportation needs - medical: Not on file  . Transportation needs - non-medical: Not on file  Occupational History  . Occupation: YOUTH EMPLOYMENT    Employer: [employer]  Tobacco Use  . Smoking status: Never Smoker  . Smokeless tobacco: Never Used  Substance and Sexual Activity  . Alcohol use: No  . Drug use: No  . Sexual activity: No    Partners: Male    Birth control/protection: None  Other Topics Concern  . Not on file  Social History Narrative   Works at [employer]          Past Surgical History:  Procedure Laterality Date  . ABDOMINAL SURGERY     18 months of age-- unsure of type of surgery  . COLONOSCOPY  04/2016   hemorrhoids--normal per pt with Dr Collene Mares  . HEMORRHOID SURGERY  2005/2006  . UTERINE FIBROID EMBOLIZATION      Family History  Problem Relation Age of Onset  . Cancer Mother        breast  . Stroke Mother   . Cancer Maternal Grandmother        breast  . Hypertension Maternal Grandmother   . Cancer Other        breast, lung  . Hypertension Other   . Stroke Other   .  Vision loss Father   . Heart disease Father        Rhythm disturbance  . Diabetes Neg Hx     Allergies  Allergen Reactions  . Lisinopril     REACTION: ACE cough    Current Outpatient Medications on File Prior to Visit  Medication Sig Dispense Refill  . AMBULATORY NON FORMULARY MEDICATION BORIC ACID CAPSULE 600mg . Insert 1 capsule vaginally two times a week. 8 capsule 5  . amLODipine (NORVASC) 10 MG tablet TAKE 1 TABLET BY MOUTH ONCE DAILY 30 tablet 3  . aspirin EC 81 MG tablet Take 1 tablet (81 mg total) by mouth daily.    Marland Kitchen atorvastatin (LIPITOR) 20 MG tablet TAKE ONE TABLET BY MOUTH ONCE DAILY 90 tablet 1  . Calcium Carbonate-Vitamin D (CALTRATE 600+D) 600-400 MG-UNIT per tablet Take 1 tablet by mouth 2 (two) times daily. Reported on 10/20/2015    . carvedilol (COREG) 25 MG tablet  Take 1 tablet (25 mg total) by mouth 2 (two) times daily with a meal. 60 tablet 3  . glucose blood (ONETOUCH VERIO) test strip Use to check blood sugar 3 times daily 100 each 3  . INVOKANA 300 MG TABS tablet TAKE 1 TABLET BY MOUTH ONCE DAILY BEFORE BREAKFAST 30 tablet 1  . JANUVIA 100 MG tablet TAKE 1 TABLET BY MOUTH ONCE DAILY 90 tablet 1  . losartan (COZAAR) 100 MG tablet TAKE ONE TABLET BY MOUTH ONCE DAILY 90 tablet 1  . metFORMIN (GLUCOPHAGE-XR) 500 MG 24 hr tablet TAKE 1 TABLET BY MOUTH TWICE DAILY 30 tablet 0  . metoprolol tartrate (LOPRESSOR) 100 MG tablet TAKE 1 TABLET BY MOUTH TWICE DAILY 180 tablet 1  . mupirocin ointment (BACTROBAN) 2 % Sig to affected area twice a day x 7 days. 22 g 1  . nystatin (NYSTATIN) powder Apply twice daily beneath breasts. 30 g 1  . ONETOUCH DELICA LANCETS FINE MISC Use to check blood sugar 3 times daily 100 each 3  . rosuvastatin (CRESTOR) 20 MG tablet Take 1 tablet (20 mg total) by mouth daily. 90 tablet 1  . spironolactone (ALDACTONE) 100 MG tablet Take 1 tablet (100 mg total) by mouth daily. 30 tablet 2   No current facility-administered medications on file prior to visit.     BP 130/71 (BP Location: Right Arm, Patient Position: Sitting, Cuff Size: Large)   Pulse 64   Temp 99 F (37.2 C) (Oral)   Resp 16   Ht 5' 2.5" (1.588 m)   Wt 219 lb (99.3 kg)   LMP 07/24/2017   SpO2 97%   BMI 39.42 kg/m       Objective:   Physical Exam  Physical Exam  Constitutional: She is oriented to person, place, and time. She appears well-developed and well-nourished. No distress.  HENT:  Head: Normocephalic and atraumatic.  Right Ear: Tympanic membrane and ear canal normal.  Left Ear: Tympanic membrane and ear canal normal.  Mouth/Throat: Oropharynx is clear and moist.  Eyes: Pupils are equal, round, and reactive to light. No scleral icterus.  Neck: Normal range of motion. No thyromegaly present.  Cardiovascular: Normal rate and regular rhythm.   No  murmur heard. Pulmonary/Chest: Effort normal and breath sounds normal. No respiratory distress. He has no wheezes. She has no rales. She exhibits no tenderness.  Abdominal: Soft. Bowel sounds are normal. She exhibits no distension and no mass. There is no tenderness. There is no rebound and no guarding.  Musculoskeletal: She exhibits no edema.  Lymphadenopathy:    She has no cervical adenopathy.  Neurological: She is alert and oriented to person, place, and time. She has normal patellar reflexes. She exhibits normal muscle tone. Coordination normal.  Skin: Skin is warm and dry.  Psychiatric: She has a normal mood and affect. Her behavior is normal. Judgment and thought content normal.  Breasts: Examined lying Right: Without masses, retractions, discharge or axillary adenopathy.  Left: Without masses, retractions, discharge or axillary adenopathy.          Assessment & Plan:         Assessment & Plan:  Preventative care- discussed healthy diet, exercise and weight loss. Obtain routine lab work. EKG tracing is personally reviewed.  EKG notes NSR.  No acute changes.

## 2017-07-25 NOTE — Patient Instructions (Signed)
Please complete lab work prior to leaving. Continue to work on [employer], exercise and weight loss- good work! Schedule mammogram on the first floor.

## 2017-07-26 LAB — URINALYSIS, ROUTINE W REFLEX MICROSCOPIC
Bilirubin Urine: NEGATIVE
Ketones, ur: NEGATIVE
Leukocytes, UA: NEGATIVE
Nitrite: NEGATIVE
Specific Gravity, Urine: 1.02 (ref 1.000–1.030)
Total Protein, Urine: NEGATIVE
Urine Glucose: >=1000 — AB
Urobilinogen, UA: 0.2 (ref 0.0–1.0)
pH: 7 (ref 5.0–8.0)

## 2017-07-26 LAB — HEPATIC FUNCTION PANEL
ALT: 14 U/L (ref 0–35)
AST: 10 U/L (ref 0–37)
Albumin: 4.1 g/dL (ref 3.5–5.2)
Alkaline Phosphatase: 48 U/L (ref 39–117)
Bilirubin, Direct: 0 mg/dL (ref 0.0–0.3)
Total Bilirubin: 0.3 mg/dL (ref 0.2–1.2)
Total Protein: 8.1 g/dL (ref 6.0–8.3)

## 2017-07-26 LAB — CBC WITH DIFFERENTIAL/PLATELET
Basophils Absolute: 0.1 10*3/uL (ref 0.0–0.1)
Basophils Relative: 0.8 % (ref 0.0–3.0)
Eosinophils Absolute: 0.2 10*3/uL (ref 0.0–0.7)
Eosinophils Relative: 2.2 % (ref 0.0–5.0)
HCT: 37.8 % (ref 36.0–46.0)
Hemoglobin: 12.8 g/dL (ref 12.0–15.0)
Lymphocytes Relative: 41.1 % (ref 12.0–46.0)
Lymphs Abs: 4 10*3/uL (ref 0.7–4.0)
MCHC: 33.8 g/dL (ref 30.0–36.0)
MCV: 86.4 fl (ref 78.0–100.0)
Monocytes Absolute: 0.7 10*3/uL (ref 0.1–1.0)
Monocytes Relative: 6.8 % (ref 3.0–12.0)
Neutro Abs: 4.8 10*3/uL (ref 1.4–7.7)
Neutrophils Relative %: 49.1 % (ref 43.0–77.0)
Platelets: 448 10*3/uL — ABNORMAL HIGH (ref 150.0–400.0)
RBC: 4.37 Mil/uL (ref 3.87–5.11)
RDW: 14.4 % (ref 11.5–15.5)
WBC: 9.8 10*3/uL (ref 4.0–10.5)

## 2017-07-26 LAB — BASIC METABOLIC PANEL
BUN: 24 mg/dL — ABNORMAL HIGH (ref 6–23)
CO2: 30 mEq/L (ref 19–32)
Calcium: 9.6 mg/dL (ref 8.4–10.5)
Chloride: 101 mEq/L (ref 96–112)
Creatinine, Ser: 0.94 mg/dL (ref 0.40–1.20)
GFR: 81.6 mL/min (ref >60.00)
Glucose, Bld: 123 mg/dL — ABNORMAL HIGH (ref 70–99)
Potassium: 4.4 mEq/L (ref 3.5–5.1)
Sodium: 137 mEq/L (ref 135–145)

## 2017-07-26 LAB — LIPID PANEL
Cholesterol: 139 mg/dL (ref 0–200)
HDL: 42 mg/dL (ref >39.00)
LDL Cholesterol: 85 mg/dL (ref 0–99)
NonHDL: 97.48
Total CHOL/HDL Ratio: 3
Triglycerides: 64 mg/dL (ref 0.0–149.0)
VLDL: 12.8 mg/dL (ref 0.0–40.0)

## 2017-07-26 LAB — TSH: TSH: 2.86 u[IU]/mL (ref 0.35–4.50)

## 2017-07-30 ENCOUNTER — Ambulatory Visit (HOSPITAL_BASED_OUTPATIENT_CLINIC_OR_DEPARTMENT_OTHER)
Admission: RE | Admit: 2017-07-30 | Discharge: 2017-07-30 | Disposition: A | Discharge status: Home / Self Care | Payer: 59 | Source: Ambulatory Visit | Attending: Family | Admitting: Family

## 2017-07-30 DIAGNOSIS — Z1231 Encounter for screening mammogram for malignant neoplasm of breast: Secondary | ICD-10-CM | POA: Insufficient documentation

## 2017-07-30 DIAGNOSIS — Z0000 Encounter for general adult medical examination without abnormal findings: Secondary | ICD-10-CM

## 2017-08-01 ENCOUNTER — Other Ambulatory Visit (INDEPENDENT_AMBULATORY_CARE_PROVIDER_SITE_OTHER): Payer: 59

## 2017-08-01 DIAGNOSIS — E1165 Type 2 diabetes mellitus with hyperglycemia: Secondary | ICD-10-CM | POA: Diagnosis not present

## 2017-08-01 LAB — BASIC METABOLIC PANEL
BUN: 22 mg/dL (ref 6–23)
CO2: 28 mEq/L (ref 19–32)
Calcium: 9.5 mg/dL (ref 8.4–10.5)
Chloride: 100 mEq/L (ref 96–112)
Creatinine, Ser: 0.97 mg/dL (ref 0.40–1.20)
GFR: 78.69 mL/min (ref >60.00)
Glucose, Bld: 118 mg/dL — ABNORMAL HIGH (ref 70–99)
Potassium: 3.8 mEq/L (ref 3.5–5.1)
Sodium: 135 mEq/L (ref 135–145)

## 2017-08-02 LAB — FRUCTOSAMINE: Fructosamine: 273 umol/L (ref 0–285)

## 2017-08-04 NOTE — Progress Notes (Signed)
Patient ID: Susan Whitaker, female   DOB: [birth year], 47 y.o.   MRN: [id number]           Reason for Appointment: Follow-up for Type 2 Diabetes  Referring physician:  Debbrah Alar   History of Present Illness:          Date of diagnosis of type 2 diabetes mellitus: 2013        Background history:  She was diagnosed to have diabetes when she was having fibroid surgery. A1c in 2013 was 6.9 She was treated with metformin but she says she was not able to take this because of diarrhea and would be irregular with it However her blood sugars stayed about the same until 2015 when they were higher and glipizide added Her blood sugars had been much higher since about late 2015 with A1c in the 9-10 range with her taking glipizide alone  Recent history:   Non-insulin hypoglycemic drugs the patient is taking are: Invokana 300 mg daily,  on Januvia 100 mg daily, metformin ER 500 mg 2x daily   Her A1c in December was 7.6 but she did not follow-up as directed until today Fructosamine is 287  Previous range 6.7-7.3  Current management, blood sugar patterns and problems identified:  She says she has not been able to get coverage for her test strips and has not done any blood sugars for some time and she did not report that she did not have a functioning meter to use  She thinks however that she is trying to do much better with her diet and cutting back on carbohydrates, sweet drinks   However she has not lost any weight  She has gone back on her Januvia since her last visit which she had not taken before that  Also she has done much better with her exercise regimen and is trying to walk at least 5 days a week  Her blood sugars in the lab look fairly close to normal  Side effects from medications have been: Diarrhea from regular metformin  Compliance with the medical regimen: Fair  Glucose monitoring:   Less than once a day recently , previously done up to 2 times a day          Glucometer:  FreeStyle      Blood Glucose readings none   Self-care: The diet that the patient has been following is: tries to limit Portions .     Typical meal intake: Breakfast is  fruit, Activia Yogurt,  For snacks she will have Snack box, chicken breast at dinnertime.  Avoiding Sweet drinks                 Dietician visit, most recent: 5/17, has  had diabetes education classes               Exercise: Walking 5/7; 30 min at work   Weight history:  Wt Readings from Last 3 Encounters:  08/05/17 221 lb (100.2 kg)  07/25/17 219 lb (99.3 kg)  07/06/17 219 lb (99.3 kg)    Glycemic control:   Lab Results  Component Value Date   HGBA1C 7.6 (H) 04/29/2017   HGBA1C 7.1 (H) 11/01/2016   HGBA1C 7.3 (H) 07/09/2016   Lab Results  Component Value Date   MICROALBUR 2.2 (H) 07/09/2016   LDLCALC 85 07/25/2017   CREATININE 0.97 08/01/2017   Lab Results  Component Value Date   MICRALBCREAT 2.9 07/09/2016    Other active problems are in review  of systems    Allergies as of 08/05/2017      Reactions   Lisinopril    REACTION: ACE cough      Medication List        Accurate as of 08/05/17  8:22 AM. Always use your most recent med list.          AMBULATORY NON FORMULARY MEDICATION BORIC ACID CAPSULE 600mg . Insert 1 capsule vaginally two times a week.   amLODipine 10 MG tablet Commonly known as:  NORVASC TAKE 1 TABLET BY MOUTH ONCE DAILY   aspirin EC 81 MG tablet Take 1 tablet (81 mg total) by mouth daily.   atorvastatin 20 MG tablet Commonly known as:  LIPITOR TAKE ONE TABLET BY MOUTH ONCE DAILY   CALTRATE 600+D 600-400 MG-UNIT tablet Generic drug:  Calcium Carbonate-Vitamin D Take 1 tablet by mouth 2 (two) times daily. Reported on 10/20/2015   carvedilol 25 MG tablet Commonly known as:  COREG Take 1 tablet (25 mg total) by mouth 2 (two) times daily with a meal.   glucose blood test strip Commonly known as:  ONETOUCH VERIO Use to check blood sugar 3 times daily    INVOKANA 300 MG Tabs tablet Generic drug:  canagliflozin TAKE 1 TABLET BY MOUTH ONCE DAILY BEFORE BREAKFAST   JANUVIA 100 MG tablet Generic drug:  sitaGLIPtin TAKE 1 TABLET BY MOUTH ONCE DAILY   losartan 100 MG tablet Commonly known as:  COZAAR TAKE ONE TABLET BY MOUTH ONCE DAILY   metFORMIN 500 MG 24 hr tablet Commonly known as:  GLUCOPHAGE-XR TAKE 1 TABLET BY MOUTH TWICE DAILY   metoprolol tartrate 100 MG tablet Commonly known as:  LOPRESSOR TAKE 1 TABLET BY MOUTH TWICE DAILY   mupirocin ointment 2 % Commonly known as:  BACTROBAN Sig to affected area twice a day x 7 days.   nystatin powder Commonly known as:  nystatin Apply twice daily beneath breasts.   ONETOUCH DELICA LANCETS FINE Misc Use to check blood sugar 3 times daily   rosuvastatin 20 MG tablet Commonly known as:  CRESTOR Take 1 tablet (20 mg total) by mouth daily.   spironolactone 100 MG tablet Commonly known as:  ALDACTONE Take 1 tablet (100 mg total) by mouth daily.       Allergies:  Allergies  Allergen Reactions  . Lisinopril     REACTION: ACE cough    Past Medical History:  Diagnosis Date  . Allergy    allergic rhinitis  . Diabetes mellitus    type II  . FHx: hypertension   . Gastric ulcer   . GERD (gastroesophageal reflux disease)   . H/O constipation   . H/O hemorrhoids   . H/O varicella   . Hyperlipidemia   . Hypertension   . Hypokalemia   . Increased BMI 06/2010  . Primary hyperaldosteronism ([place]) 02/21/2012  . SVT (supraventricular tachycardia) ([place])    Noted 11/2011 admission  . Thrombocytopenia ([place])   . Thyroid fullness 08/2005  . Urinary incontinence     Past Surgical History:  Procedure Laterality Date  . ABDOMINAL SURGERY     80 months of age-- unsure of type of surgery  . COLONOSCOPY  04/2016   hemorrhoids--normal per pt with Dr Collene Mares  . HEMORRHOID SURGERY  2005/2006  . UTERINE FIBROID EMBOLIZATION      Family History  Problem Relation Age of Onset  . Cancer  Mother        breast  . Stroke Mother   . Cancer  Maternal Grandmother        breast  . Hypertension Maternal Grandmother   . Cancer Other        breast, lung  . Hypertension Other   . Stroke Other   . Vision loss Father   . Heart disease Father        Rhythm disturbance  . Diabetes Neg Hx     Social History:  reports that she has never smoked. She has never used smokeless tobacco. She reports that she does not drink alcohol or use drugs.    Review of Systems    Lipid history: LDL 171 At baseline Taking Lipitor    Lab Results  Component Value Date   CHOL 139 07/25/2017   HDL 42.00 07/25/2017   LDLCALC 85 07/25/2017   LDLDIRECT 103.1 03/04/2014   TRIG 64.0 07/25/2017   CHOLHDL 3 07/25/2017           Hypertension:   Appears to be better controlled and more consistent Because of hypokalemia she was recommended 100 mg of Aldactone and she is getting this now No further hypokalemia       BP Readings from Last 3 Encounters:  08/05/17 130/72  07/25/17 130/71  07/06/17 130/64    Lab Results  Component Value Date   CREATININE 0.97 08/01/2017   BUN 22 08/01/2017   NA 135 08/01/2017   K 3.8 08/01/2017   CL 100 08/01/2017   CO2 28 08/01/2017     She has been noted to have a goiter with multiple nodules, stable as of 2015, largest 1.4 cm nodule in the right side   Most recent foot exam: 7/18  Review of Systems    Lab on 08/01/2017  Component Date Value Ref Range Status  . Fructosamine 08/01/2017 273  0 - 285 umol/L Final   Comment: Published reference interval for apparently healthy subjects between age 29 and 34 is 82 - 285 umol/L and in a poorly controlled diabetic population is 228 - 563 umol/L with a mean of 396 umol/L.   Marland Kitchen Sodium 08/01/2017 135  135 - 145 mEq/L Final  . Potassium 08/01/2017 3.8  3.5 - 5.1 mEq/L Final  . Chloride 08/01/2017 100  96 - 112 mEq/L Final  . CO2 08/01/2017 28  19 - 32 mEq/L Final  . Glucose, Bld 08/01/2017 118* 70 -  99 mg/dL Final  . BUN 08/01/2017 22  6 - 23 mg/dL Final  . Creatinine, Ser 08/01/2017 0.97  0.40 - 1.20 mg/dL Final  . Calcium 08/01/2017 9.5  8.4 - 10.5 mg/dL Final  . GFR 08/01/2017 78.69  >60.00 mL/min Final    Physical Examination:  BP 130/72 (BP Location: Left Arm, Patient Position: Sitting, Cuff Size: Large)   Pulse 71   Ht 5' 2.5" (1.588 m)   Wt 221 lb (100.2 kg)   LMP 07/24/2017   SpO2 96%   BMI 39.78 kg/m       ASSESSMENT/PLAN:  Diabetes type 2, obese  See history of present illness for detailed discussion of current diabetes management, blood sugar patterns and problems identified  She is on a multidrug regimen with Januvia, Invokana 300 mg and low-dose metformin ER  She has done well with lifestyle changes and is exercising and watching her diet However she has not lost weight He has difficulty finding out what her insurance will cover as far as glucose monitoring and has not done any testing She is having better control with near normal fructosamine and fairly good  lab blood sugar  She will be given Accu-Chek monitor today and she will get the test strips for this coverage  HYPERTENSION:  Better controlled and hypokalemia is also under control with 100 mg of Aldactone    Patient Instructions  Check blood sugars on waking up  2/9  Also check blood sugars about 2 hours after a meal and do this after different meals by rotation  Recommended blood sugar levels on waking up is 90-130 and about 2 hours after meal is 130-160  Please bring your blood sugar monitor to each visit, thank you    Elayne Snare 08/05/2017, 8:22 AM   Note: This office note was prepared with Dragon voice recognition system technology. Any transcriptional errors that result from this process are unintentional.

## 2017-08-05 ENCOUNTER — Encounter: Payer: Self-pay | Admitting: Endocrinology

## 2017-08-05 ENCOUNTER — Ambulatory Visit (INDEPENDENT_AMBULATORY_CARE_PROVIDER_SITE_OTHER): Payer: 59 | Admitting: Endocrinology

## 2017-08-05 VITALS — BP 130/72 | HR 71 | Ht 62.5 in | Wt 221.0 lb

## 2017-08-05 DIAGNOSIS — E1165 Type 2 diabetes mellitus with hyperglycemia: Secondary | ICD-10-CM

## 2017-08-05 MED ORDER — GLUCOSE BLOOD VI STRP: 1.0000 | ORAL_STRIP | Freq: Three times a day (TID) | 12 refills | Status: DC

## 2017-08-05 NOTE — Patient Instructions (Signed)
Check blood sugars on waking up  2/9  Also check blood sugars about 2 hours after a meal and do this after different meals by rotation  Recommended blood sugar levels on waking up is 90-130 and about 2 hours after meal is 130-160  Please bring your blood sugar monitor to each visit, thank you

## 2017-08-23 ENCOUNTER — Other Ambulatory Visit: Payer: Self-pay | Admitting: Emergency Medicine

## 2017-08-23 ENCOUNTER — Other Ambulatory Visit: Payer: Self-pay | Admitting: Endocrinology

## 2017-08-23 DIAGNOSIS — L0390 Cellulitis, unspecified: Secondary | ICD-10-CM

## 2017-08-23 NOTE — Telephone Encounter (Signed)
Left VM re: refill request, symptoms.  ATBs for 7 days only, completed 08/10/17

## 2017-11-02 ENCOUNTER — Other Ambulatory Visit: Payer: Self-pay | Admitting: Family

## 2017-11-03 ENCOUNTER — Other Ambulatory Visit: Payer: Self-pay | Admitting: Endocrinology

## 2017-11-03 MED ORDER — METFORMIN HCL ER 500 MG PO TB24: 500.0000 mg | ORAL_TABLET | Freq: Two times a day (BID) | ORAL | 1 refills | Status: DC

## 2017-11-07 ENCOUNTER — Other Ambulatory Visit (INDEPENDENT_AMBULATORY_CARE_PROVIDER_SITE_OTHER): Payer: 59

## 2017-11-07 DIAGNOSIS — E1165 Type 2 diabetes mellitus with hyperglycemia: Secondary | ICD-10-CM | POA: Diagnosis not present

## 2017-11-07 LAB — COMPREHENSIVE METABOLIC PANEL
ALT: 13 U/L (ref 0–35)
AST: 11 U/L (ref 0–37)
Albumin: 4.2 g/dL (ref 3.5–5.2)
Alkaline Phosphatase: 57 U/L (ref 39–117)
BUN: 23 mg/dL (ref 6–23)
CO2: 28 mEq/L (ref 19–32)
Calcium: 9.4 mg/dL (ref 8.4–10.5)
Chloride: 102 mEq/L (ref 96–112)
Creatinine, Ser: 1.03 mg/dL (ref 0.40–1.20)
GFR: 73.34 mL/min (ref >60.00)
Glucose, Bld: 140 mg/dL — ABNORMAL HIGH (ref 70–99)
Potassium: 3.9 mEq/L (ref 3.5–5.1)
Sodium: 141 mEq/L (ref 135–145)
Total Bilirubin: 0.3 mg/dL (ref 0.2–1.2)
Total Protein: 8.2 g/dL (ref 6.0–8.3)

## 2017-11-07 LAB — HEMOGLOBIN A1C: Hgb A1c MFr Bld: 7.4 % — ABNORMAL HIGH (ref 4.6–6.5)

## 2017-11-07 LAB — MICROALBUMIN / CREATININE URINE RATIO
Creatinine,U: 69.4 mg/dL
Microalb Creat Ratio: 2.2 mg/g (ref 0.0–30.0)
Microalb, Ur: 1.5 mg/dL (ref 0.0–1.9)

## 2017-11-10 ENCOUNTER — Encounter: Payer: Self-pay | Admitting: Endocrinology

## 2017-11-10 ENCOUNTER — Ambulatory Visit (INDEPENDENT_AMBULATORY_CARE_PROVIDER_SITE_OTHER): Payer: 59 | Admitting: Endocrinology

## 2017-11-10 ENCOUNTER — Other Ambulatory Visit: Payer: Self-pay

## 2017-11-10 VITALS — BP 136/76 | HR 81 | Ht 62.5 in | Wt 217.0 lb

## 2017-11-10 DIAGNOSIS — E1165 Type 2 diabetes mellitus with hyperglycemia: Secondary | ICD-10-CM

## 2017-11-10 DIAGNOSIS — R300 Dysuria: Secondary | ICD-10-CM

## 2017-11-10 LAB — POCT URINALYSIS DIPSTICK
Bilirubin, UA: 0.2
Glucose, UA: POSITIVE — AB
Ketones, UA: NEGATIVE
Leukocytes, UA: NEGATIVE
Nitrite, UA: NEGATIVE
Protein, UA: NEGATIVE
Spec Grav, UA: 1.015 (ref 1.010–1.025)
Urobilinogen, UA: 0.2 E.U./dL
pH, UA: 6.5 (ref 5.0–8.0)

## 2017-11-10 LAB — GLUCOSE, POCT (MANUAL RESULT ENTRY): POC Glucose: 134 mg/dl — AB (ref 70–99)

## 2017-11-10 MED ORDER — GLUCOSE BLOOD VI STRP: 1.0000 | ORAL_STRIP | Freq: Three times a day (TID) | 12 refills | Status: DC

## 2017-11-10 NOTE — Progress Notes (Signed)
Patient ID: Susan Whitaker, female   DOB: 1970-12-12, 47 y.o.   MRN: [id number]           Reason for Appointment: Follow-up for Type 2 Diabetes  Referring physician:  Debbrah Alar   History of Present Illness:          Date of diagnosis of type 2 diabetes mellitus: 2013        Background history:  She was diagnosed to have diabetes when she was having fibroid surgery. A1c in 2013 was 6.9 She was treated with metformin but she says she was not able to take this because of diarrhea and would be irregular with it However her blood sugars stayed about the same until 2015 when they were higher and glipizide added Her blood sugars had been much higher since about late 2015 with A1c in the 9-10 range with her taking glipizide alone  Recent history:   Non-insulin hypoglycemic drugs the patient is taking are: Invokana 300 mg daily, Januvia 100 mg daily, metformin ER 500 mg 2x daily   Her A1c in December was 7.6 and is now 7.4  Previous range 6.7-7.6  Current management, blood sugar patterns and problems identified:  She again has not been able to afford test strips and is using a friend's monitor  Even though she thinks her blood sugars are excellent her A1c is still relatively high  She is however continuing to improve her diet and watching carbohydrates  She has also increase her exercise level somewhat and is playing golf in addition to walking  Taking all her medications consistently now  Nonfasting lab glucose was 140   No side effects with the Invokana  Side effects from medications have been: Diarrhea from regular metformin  Compliance with the medical regimen: Improving  Glucose monitoring:   Less than once a day recently , previously done up to 2 times a day         Glucometer:  FreeStyle      Blood Glucose readings   After meals up to 160 Fasting as low as 118  Self-care: The diet that the patient has been following is: tries to limit Portions .       Typical meal intake: Breakfast is  fruit, Activia Yogurt,  For snacks she will have Snack box, chicken breast at dinnertime.  Avoiding Sweet drinks                 Dietician visit, most recent: 5/17, has  had diabetes education classes               Exercise: Walking 5/7; playing golf  Weight history:  Wt Readings from Last 3 Encounters:  11/10/17 217 lb (98.4 kg)  08/05/17 221 lb (100.2 kg)  07/25/17 219 lb (99.3 kg)    Glycemic control:   Lab Results  Component Value Date   HGBA1C 7.4 (H) 11/07/2017   HGBA1C 7.6 (H) 04/29/2017   HGBA1C 7.1 (H) 11/01/2016   Lab Results  Component Value Date   MICROALBUR 1.5 11/07/2017   LDLCALC 85 07/25/2017   CREATININE 1.03 11/07/2017   Lab Results  Component Value Date   MICRALBCREAT 2.2 11/07/2017   Lab on 11/07/2017  Component Date Value Ref Range Status  . Microalb, Ur 11/07/2017 1.5  0.0 - 1.9 mg/dL Final  . Creatinine,U 11/07/2017 69.4  mg/dL Final  . Microalb Creat Ratio 11/07/2017 2.2  0.0 - 30.0 mg/g Final  . Sodium 11/07/2017 141  135 -  145 mEq/L Final  . Potassium 11/07/2017 3.9  3.5 - 5.1 mEq/L Final  . Chloride 11/07/2017 102  96 - 112 mEq/L Final  . CO2 11/07/2017 28  19 - 32 mEq/L Final  . Glucose, Bld 11/07/2017 140* 70 - 99 mg/dL Final  . BUN 11/07/2017 23  6 - 23 mg/dL Final  . Creatinine, Ser 11/07/2017 1.03  0.40 - 1.20 mg/dL Final  . Total Bilirubin 11/07/2017 0.3  0.2 - 1.2 mg/dL Final  . Alkaline Phosphatase 11/07/2017 57  39 - 117 U/L Final  . AST 11/07/2017 11  0 - 37 U/L Final  . ALT 11/07/2017 13  0 - 35 U/L Final  . Total Protein 11/07/2017 8.2  6.0 - 8.3 g/dL Final  . Albumin 11/07/2017 4.2  3.5 - 5.2 g/dL Final  . Calcium 11/07/2017 9.4  8.4 - 10.5 mg/dL Final  . GFR 11/07/2017 73.34  >60.00 mL/min Final  . Hgb A1c MFr Bld 11/07/2017 7.4* 4.6 - 6.5 % Final   Glycemic Control Guidelines for People with Diabetes:Non Diabetic:  <6%Goal of Therapy: <7%Additional Action Suggested:  >8%      Other  active problems are in review of systems    Allergies as of 11/10/2017      Reactions   Lisinopril    REACTION: ACE cough      Medication List        Accurate as of 11/10/17  8:30 AM. Always use your most recent med list.          AMBULATORY NON FORMULARY MEDICATION BORIC ACID CAPSULE 600mg . Insert 1 capsule vaginally two times a week.   amLODipine 10 MG tablet Commonly known as:  NORVASC TAKE 1 TABLET BY MOUTH ONCE DAILY   aspirin EC 81 MG tablet Take 1 tablet (81 mg total) by mouth daily.   atorvastatin 20 MG tablet Commonly known as:  LIPITOR TAKE ONE TABLET BY MOUTH ONCE DAILY   CALTRATE 600+D 600-400 MG-UNIT tablet Generic drug:  Calcium Carbonate-Vitamin D Take 1 tablet by mouth 2 (two) times daily. Reported on 10/20/2015   carvedilol 25 MG tablet Commonly known as:  COREG TAKE 1 TABLET BY MOUTH TWICE DAILY WITH A MEAL   glucose blood test strip Commonly known as:  ACCU-CHEK GUIDE 1 each by Other route 3 (three) times daily. Use as instructed   INVOKANA 300 MG Tabs tablet Generic drug:  canagliflozin TAKE 1 TABLET BY MOUTH ONCE DAILY BEFORE BREAKFAST   JANUVIA 100 MG tablet Generic drug:  sitaGLIPtin TAKE 1 TABLET BY MOUTH ONCE DAILY   losartan 100 MG tablet Commonly known as:  COZAAR TAKE ONE TABLET BY MOUTH ONCE DAILY   metFORMIN 500 MG 24 hr tablet Commonly known as:  GLUCOPHAGE-XR Take 1 tablet (500 mg total) by mouth 2 (two) times daily.   metoprolol tartrate 100 MG tablet Commonly known as:  LOPRESSOR TAKE 1 TABLET BY MOUTH TWICE DAILY   mupirocin ointment 2 % Commonly known as:  BACTROBAN Sig to affected area twice a day x 7 days.   nystatin powder Commonly known as:  nystatin Apply twice daily beneath breasts.   ONETOUCH DELICA LANCETS FINE Misc Use to check blood sugar 3 times daily   rosuvastatin 20 MG tablet Commonly known as:  CRESTOR Take 1 tablet (20 mg total) by mouth daily.   spironolactone 100 MG tablet Commonly known  as:  ALDACTONE Take 1 tablet (100 mg total) by mouth daily.       Allergies:  Allergies  Allergen Reactions  . Lisinopril     REACTION: ACE cough    Past Medical History:  Diagnosis Date  . Allergy    allergic rhinitis  . Diabetes mellitus    type II  . FHx: hypertension   . Gastric ulcer   . GERD (gastroesophageal reflux disease)   . H/O constipation   . H/O hemorrhoids   . H/O varicella   . Hyperlipidemia   . Hypertension   . Hypokalemia   . Increased BMI 06/2010  . Primary hyperaldosteronism ([place]) 02/21/2012  . SVT (supraventricular tachycardia) ([place])    Noted 11/2011 admission  . Thrombocytopenia ([place])   . Thyroid fullness 08/2005  . Urinary incontinence     Past Surgical History:  Procedure Laterality Date  . ABDOMINAL SURGERY     79 months of age-- unsure of type of surgery  . COLONOSCOPY  04/2016   hemorrhoids--normal per pt with Dr Collene Mares  . HEMORRHOID SURGERY  2005/2006  . UTERINE FIBROID EMBOLIZATION      Family History  Problem Relation Age of Onset  . Cancer Mother        breast  . Stroke Mother   . Cancer Maternal Grandmother        breast  . Hypertension Maternal Grandmother   . Cancer Other        breast, lung  . Hypertension Other   . Stroke Other   . Vision loss Father   . Heart disease Father        Rhythm disturbance  . Diabetes Neg Hx     Social History:  reports that she has never smoked. She has never used smokeless tobacco. She reports that she does not drink alcohol or use drugs.    Review of Systems    Lipid history: LDL 171 At baseline Taking Lipitor 20 mg for this    Lab Results  Component Value Date   CHOL 139 07/25/2017   HDL 42.00 07/25/2017   LDLCALC 85 07/25/2017   LDLDIRECT 103.1 03/04/2014   TRIG 64.0 07/25/2017   CHOLHDL 3 07/25/2017           Hypertension:   She is on amlodipine and losartan Because of hypokalemia she was started on 100 mg of Aldactone  No further hypokalemia     BP Readings from  Last 3 Encounters:  11/10/17 136/76  08/05/17 130/72  07/25/17 130/71    Lab Results  Component Value Date   CREATININE 1.03 11/07/2017   BUN 23 11/07/2017   NA 141 11/07/2017   K 3.9 11/07/2017   CL 102 11/07/2017   CO2 28 11/07/2017     She has been noted to have a goiter with multiple nodules, stable as of 2015, largest 1.4 cm nodule in the right side   Most recent foot exam: 7/18   She thinks she may have a UTI  Review of Systems    Lab on 11/07/2017  Component Date Value Ref Range Status  . Microalb, Ur 11/07/2017 1.5  0.0 - 1.9 mg/dL Final  . Creatinine,U 11/07/2017 69.4  mg/dL Final  . Microalb Creat Ratio 11/07/2017 2.2  0.0 - 30.0 mg/g Final  . Sodium 11/07/2017 141  135 - 145 mEq/L Final  . Potassium 11/07/2017 3.9  3.5 - 5.1 mEq/L Final  . Chloride 11/07/2017 102  96 - 112 mEq/L Final  . CO2 11/07/2017 28  19 - 32 mEq/L Final  . Glucose, Bld 11/07/2017 140* 70 - 99 mg/dL  Final  . BUN 11/07/2017 23  6 - 23 mg/dL Final  . Creatinine, Ser 11/07/2017 1.03  0.40 - 1.20 mg/dL Final  . Total Bilirubin 11/07/2017 0.3  0.2 - 1.2 mg/dL Final  . Alkaline Phosphatase 11/07/2017 57  39 - 117 U/L Final  . AST 11/07/2017 11  0 - 37 U/L Final  . ALT 11/07/2017 13  0 - 35 U/L Final  . Total Protein 11/07/2017 8.2  6.0 - 8.3 g/dL Final  . Albumin 11/07/2017 4.2  3.5 - 5.2 g/dL Final  . Calcium 11/07/2017 9.4  8.4 - 10.5 mg/dL Final  . GFR 11/07/2017 73.34  >60.00 mL/min Final  . Hgb A1c MFr Bld 11/07/2017 7.4* 4.6 - 6.5 % Final   Glycemic Control Guidelines for People with Diabetes:Non Diabetic:  <6%Goal of Therapy: <7%Additional Action Suggested:  >8%     Physical Examination:  BP 136/76 (BP Location: Left Arm, Patient Position: Sitting, Cuff Size: Normal)   Pulse 81   Ht 5' 2.5" (1.588 m)   Wt 217 lb (98.4 kg)   SpO2 98%   BMI 39.06 kg/m       ASSESSMENT/PLAN:  Diabetes type 2, obese  See history of present illness for detailed discussion of current  diabetes management, blood sugar patterns and problems identified  Her A1c is 7.4, previously fructosamine 287  She is on a multidrug regimen with Januvia, Invokana 300 mg and low-dose metformin ER  She has done well with lifestyle changes and is exercising and watching her diet However not sure why her A1c is high even though she reports fairly good blood sugars at home She does need to check her sugars after meals more often however   She will be given Accu-Chek test strips  HYPERTENSION:  Consistently controlled and hypokalemia is also under control with 100 mg of Aldactone   Urinalysis negative for UTI  There are no Patient Instructions on file for this visit.  Elayne Snare 11/10/2017, 8:30 AM   Note: This office note was prepared with Dragon voice recognition system technology. Any transcriptional errors that result from this process are unintentional.

## 2017-11-10 NOTE — Patient Instructions (Signed)
Check blood sugars on waking up  2/7 days  Also check blood sugars about 2 hours after a meal and do this after different meals by rotation  Recommended blood sugar levels on waking up is 90-130 and about 2 hours after meal is 130-160  Please bring your blood sugar monitor to each visit, thank you  

## 2018-01-14 ENCOUNTER — Other Ambulatory Visit: Payer: Self-pay | Admitting: Family

## 2018-01-17 NOTE — Telephone Encounter (Signed)
Amlodipine refill sent to pharmacy. Pt last OV 07/25/17 and no future appts scheduled. When is next OV due?

## 2018-01-18 NOTE — Telephone Encounter (Signed)
Due for follow up this month please.

## 2018-02-06 ENCOUNTER — Other Ambulatory Visit: Payer: Self-pay

## 2018-02-09 ENCOUNTER — Ambulatory Visit: Payer: Self-pay | Admitting: Endocrinology

## 2018-03-09 ENCOUNTER — Other Ambulatory Visit: Payer: Self-pay | Admitting: Family

## 2018-03-10 NOTE — Telephone Encounter (Signed)
rx sent for 30 day supplies. Please contact pt to arrange follow up.

## 2018-03-10 NOTE — Telephone Encounter (Signed)
Susan Whitaker -- please advise losartan and spironolactone requests.  Pt last seen by [place] in March and has no future appointments scheduled.

## 2018-03-13 NOTE — Telephone Encounter (Signed)
appt scheduled for 03/20/18.

## 2018-03-13 NOTE — Telephone Encounter (Signed)
Please schedule f/u

## 2018-03-17 DIAGNOSIS — H04123 Dry eye syndrome of bilateral lacrimal glands: Secondary | ICD-10-CM | POA: Diagnosis not present

## 2018-03-17 DIAGNOSIS — H16223 Keratoconjunctivitis sicca, not specified as Sjogren's, bilateral: Secondary | ICD-10-CM | POA: Diagnosis not present

## 2018-03-17 DIAGNOSIS — E119 Type 2 diabetes mellitus without complications: Secondary | ICD-10-CM | POA: Diagnosis not present

## 2018-03-17 LAB — HM DIABETES EYE EXAM

## 2018-03-20 ENCOUNTER — Ambulatory Visit: Payer: 59 | Admitting: Family

## 2018-03-20 ENCOUNTER — Encounter: Payer: Self-pay | Admitting: Family

## 2018-03-20 VITALS — BP 139/66 | HR 68 | Temp 99.3°F | Resp 18 | Ht 63.0 in | Wt 220.4 lb

## 2018-03-20 DIAGNOSIS — E785 Hyperlipidemia, unspecified: Secondary | ICD-10-CM | POA: Diagnosis not present

## 2018-03-20 DIAGNOSIS — E1122 Type 2 diabetes mellitus with diabetic chronic kidney disease: Secondary | ICD-10-CM

## 2018-03-20 DIAGNOSIS — I471 Supraventricular tachycardia: Secondary | ICD-10-CM

## 2018-03-20 DIAGNOSIS — I10 Essential (primary) hypertension: Secondary | ICD-10-CM

## 2018-03-20 NOTE — Progress Notes (Signed)
Subjective:    Patient ID: Susan Whitaker, female    DOB: Jan 24, 1971, 47 y.o.   MRN: [id number]  HPI  Patient is a 47 yr old female who presents today for follow up.  1) HTN- maintained on amlodipine/losartan/coreg/aldactone  BP Readings from Last 3 Encounters:  03/20/18 139/66  11/10/17 136/76  08/05/17 130/72   2) Hyperlipidemia- maintained on crestor.   Lab Results  Component Value Date   CHOL 139 07/25/2017   HDL 42.00 07/25/2017   LDLCALC 85 07/25/2017   LDLDIRECT 103.1 03/04/2014   TRIG 64.0 07/25/2017   CHOLHDL 3 07/25/2017   3) DM2- this is being managed by endo.  Lab Results  Component Value Date   HGBA1C 7.4 (H) 11/07/2017   HGBA1C 7.6 (H) 04/29/2017   HGBA1C 7.1 (H) 11/01/2016   Lab Results  Component Value Date   MICROALBUR 1.5 11/07/2017   LDLCALC 85 07/25/2017   CREATININE 1.03 11/07/2017   4) Hx of SVT- reports occasional palpitations, no palpitations this week. Has hx of SVT and has seen Dr. Lovena Le remotely, requests follow up with cardiology.     Review of Systems    see HPI  Past Medical History:  Diagnosis Date  . Allergy    allergic rhinitis  . Diabetes mellitus    type II  . FHx: hypertension   . Gastric ulcer   . GERD (gastroesophageal reflux disease)   . H/O constipation   . H/O hemorrhoids   . H/O varicella   . Hyperlipidemia   . Hypertension   . Hypokalemia   . Increased BMI 06/2010  . Primary hyperaldosteronism ([place]) 02/21/2012  . SVT (supraventricular tachycardia) ([place])    Noted 11/2011 admission  . Thrombocytopenia ([place])   . Thyroid fullness 08/2005  . Urinary incontinence      Social History   Socioeconomic History  . Marital status: Single    Spouse name: Not on file  . Number of children: 2  . Years of education: Not on file  . Highest education level: Not on file  Occupational History  . Occupation: YOUTH EMPLOYMENT    Employer: [employer] ([employer])  Social Needs  . Financial resource strain: Not on file  . Food  insecurity:    Worry: Not on file    Inability: Not on file  . Transportation needs:    Medical: Not on file    Non-medical: Not on file  Tobacco Use  . Smoking status: Never Smoker  . Smokeless tobacco: Never Used  Substance and Sexual Activity  . Alcohol use: No  . Drug use: No  . Sexual activity: Never    Partners: Male    Birth control/protection: None  Lifestyle  . Physical activity:    Days per week: Not on file    Minutes per session: Not on file  . Stress: Not on file  Relationships  . Social connections:    Talks on phone: Not on file    Gets together: Not on file    Attends religious service: Not on file    Active member of club or organization: Not on file    Attends meetings of clubs or organizations: Not on file    Relationship status: Not on file  . Intimate partner violence:    Fear of current or ex partner: Not on file    Emotionally abused: Not on file    Physically abused: Not on file    Forced sexual activity: Not on file  Other  Topics Concern  . Not on file  Social History Narrative   Works at [employer]          Past Surgical History:  Procedure Laterality Date  . ABDOMINAL SURGERY     16 months of age-- unsure of type of surgery  . COLONOSCOPY  04/2016   hemorrhoids--normal per pt with Dr Collene Mares  . HEMORRHOID SURGERY  2005/2006  . UTERINE FIBROID EMBOLIZATION      Family History  Problem Relation Age of Onset  . Cancer Mother        breast  . Stroke Mother   . Cancer Maternal Grandmother        breast  . Hypertension Maternal Grandmother   . Cancer Other        breast, lung  . Hypertension Other   . Stroke Other   . Vision loss Father   . Heart disease Father        Rhythm disturbance  . Diabetes Neg Hx     Allergies  Allergen Reactions  . Lisinopril     REACTION: ACE cough    Current Outpatient Medications on File Prior to Visit  Medication Sig Dispense Refill  . AMBULATORY NON FORMULARY MEDICATION BORIC ACID  CAPSULE 600mg . Insert 1 capsule vaginally two times a week. 8 capsule 5  . amLODipine (NORVASC) 10 MG tablet TAKE 1 TABLET BY MOUTH ONCE DAILY 90 tablet 0  . aspirin EC 81 MG tablet Take 1 tablet (81 mg total) by mouth daily.    Marland Kitchen atorvastatin (LIPITOR) 20 MG tablet TAKE ONE TABLET BY MOUTH ONCE DAILY 90 tablet 1  . Calcium Carbonate-Vitamin D (CALTRATE 600+D) 600-400 MG-UNIT per tablet Take 1 tablet by mouth 2 (two) times daily. Reported on 10/20/2015    . carvedilol (COREG) 25 MG tablet TAKE 1 TABLET BY MOUTH TWICE DAILY WITH A MEAL 180 tablet 0  . glucose blood (ACCU-CHEK GUIDE) test strip 1 each by Other route 3 (three) times daily. Use as instructed to check once daily. 100 each 12  . INVOKANA 300 MG TABS tablet TAKE 1 TABLET BY MOUTH ONCE DAILY BEFORE BREAKFAST 90 tablet 1  . JANUVIA 100 MG tablet TAKE 1 TABLET BY MOUTH ONCE DAILY 90 tablet 1  . losartan (COZAAR) 100 MG tablet TAKE 1 TABLET BY MOUTH ONCE DAILY 30 tablet 0  . metFORMIN (GLUCOPHAGE-XR) 500 MG 24 hr tablet Take 1 tablet (500 mg total) by mouth 2 (two) times daily. 180 tablet 1  . metoprolol tartrate (LOPRESSOR) 100 MG tablet TAKE 1 TABLET BY MOUTH TWICE DAILY 180 tablet 1  . mupirocin ointment (BACTROBAN) 2 % Sig to affected area twice a day x 7 days. 22 g 1  . nystatin (NYSTATIN) powder Apply twice daily beneath breasts. 30 g 1  . ONETOUCH DELICA LANCETS FINE MISC Use to check blood sugar 3 times daily 100 each 3  . rosuvastatin (CRESTOR) 20 MG tablet Take 1 tablet (20 mg total) by mouth daily. 90 tablet 1  . spironolactone (ALDACTONE) 100 MG tablet TAKE 1 TABLET BY MOUTH ONCE DAILY 30 tablet 0   No current facility-administered medications on file prior to visit.     BP 139/66 (BP Location: Right Arm, Cuff Size: Large)   Pulse 68   Temp 99.3 F (37.4 C) (Oral)   Resp 18   Ht 5\' 3"  (1.6 m)   Wt 220 lb 6.4 oz (100 kg)   LMP 03/20/2018   SpO2 99%   BMI 39.04  kg/m    Objective:   Physical Exam  Constitutional: She  is oriented to person, place, and time. She appears well-developed and well-nourished.  Cardiovascular: Normal rate, regular rhythm and normal heart sounds.  No murmur heard. Pulmonary/Chest: Effort normal and breath sounds normal. No respiratory distress. She has no wheezes.  Neurological: She is alert and oriented to person, place, and time.  Skin: Skin is warm and dry.  Psychiatric: She has a normal mood and affect. Her behavior is normal. Judgment and thought content normal.          Assessment & Plan:  HTN- BP stable, continue current medications, has cmet pending next week with endo.  Lab is closed at time of her visit today.  DM2- clinically stable- defer management to Dr. Dwyane Dee.   Hyperlipidemia- LDL at goal, continue statin.  Hx of SVT- asymptomatic today. Will arrange follow up with EP.  She is advised to go to the ER if she develops recurrent palpitations/elevated heart rate. She verbalizes understanding.

## 2018-03-20 NOTE — Patient Instructions (Addendum)
Please follow up as scheduled with Dr. Dwyane Dee. You should be contacted about scheduling your follow up with the cardiologist.

## 2018-03-21 ENCOUNTER — Telehealth: Payer: Self-pay | Admitting: Family

## 2018-03-21 ENCOUNTER — Encounter (HOSPITAL_COMMUNITY): Payer: Self-pay | Admitting: *Deleted

## 2018-03-21 ENCOUNTER — Emergency Department (HOSPITAL_COMMUNITY)
Admission: EM | Admit: 2018-03-21 | Discharge: 2018-03-21 | Disposition: A | Discharge status: Home / Self Care | Payer: 59 | Attending: Emergency Medicine | Admitting: Emergency Medicine

## 2018-03-21 DIAGNOSIS — I10 Essential (primary) hypertension: Secondary | ICD-10-CM | POA: Diagnosis not present

## 2018-03-21 DIAGNOSIS — I471 Supraventricular tachycardia: Secondary | ICD-10-CM | POA: Diagnosis not present

## 2018-03-21 DIAGNOSIS — Z7984 Long term (current) use of oral hypoglycemic drugs: Secondary | ICD-10-CM | POA: Insufficient documentation

## 2018-03-21 DIAGNOSIS — E119 Type 2 diabetes mellitus without complications: Secondary | ICD-10-CM | POA: Insufficient documentation

## 2018-03-21 DIAGNOSIS — R000 Tachycardia, unspecified: Secondary | ICD-10-CM | POA: Diagnosis present

## 2018-03-21 DIAGNOSIS — Z79899 Other long term (current) drug therapy: Secondary | ICD-10-CM | POA: Diagnosis not present

## 2018-03-21 DIAGNOSIS — F419 Anxiety disorder, unspecified: Secondary | ICD-10-CM | POA: Diagnosis not present

## 2018-03-21 DIAGNOSIS — Z7982 Long term (current) use of aspirin: Secondary | ICD-10-CM | POA: Diagnosis not present

## 2018-03-21 LAB — I-STAT CHEM 8, ED
BUN: 27 mg/dL — ABNORMAL HIGH (ref 6–20)
Calcium, Ion: 1.07 mmol/L — ABNORMAL LOW (ref 1.15–1.40)
Chloride: 107 mmol/L (ref 98–111)
Creatinine, Ser: 1.1 mg/dL — ABNORMAL HIGH (ref 0.44–1.00)
Glucose, Bld: 252 mg/dL — ABNORMAL HIGH (ref 70–99)
HCT: 45 % (ref 36.0–46.0)
Hemoglobin: 15.3 g/dL — ABNORMAL HIGH (ref 12.0–15.0)
Potassium: 5.1 mmol/L (ref 3.5–5.1)
Sodium: 138 mmol/L (ref 135–145)
TCO2: 24 mmol/L (ref 22–32)

## 2018-03-21 LAB — I-STAT TROPONIN, ED: Troponin i, poc: 0.01 ng/mL (ref 0.00–0.08)

## 2018-03-21 LAB — MAGNESIUM: Magnesium: 2.3 mg/dL (ref 1.7–2.4)

## 2018-03-21 MED ORDER — SODIUM CHLORIDE 0.9 % IV SOLN
INTRAVENOUS | Status: DC
  Administered 2018-03-21: 14:00:00 via INTRAVENOUS

## 2018-03-21 MED ORDER — ADENOSINE 6 MG/2ML IV SOLN
INTRAVENOUS | Status: AC
  Filled 2018-03-21: qty 4

## 2018-03-21 MED ORDER — ADENOSINE 6 MG/2ML IV SOLN
6.0000 mg | Freq: Once | INTRAVENOUS | Status: AC
  Administered 2018-03-21: 6 mg via INTRAVENOUS

## 2018-03-21 NOTE — ED Triage Notes (Signed)
Pt in c/o feeling funny with heart palpitations onset today while at work, pt HR 158 in triage, pt reports taking metoprolol today, pt diaphoretic, pt A&O x4, denies n/v/d

## 2018-03-21 NOTE — Discharge Instructions (Addendum)
Continue your metoprolol, follow up with Dr Lovena Le

## 2018-03-21 NOTE — Telephone Encounter (Signed)
Copied from [place] [id number]. Topic: Quick Communication - See Telephone Encounter >> Mar 21, 2018  3:36 PM R[place]ford Nail, [place] wrote: CRM for notification. See Telephone encounter for: 03/21/18. Juliann Pulse with [facility] calling and states that she needs clarification on whether the patient is supposed to be taking atorvastatin (LIPITOR) 20 MG tablet   and   rosuvastatin (CRESTOR) 20 MG tablet. States that they were both picked up in August. Please advise.  CB#: [phone] (will have this phone until 11pm)

## 2018-03-21 NOTE — Telephone Encounter (Signed)
Per 04/14/17 refill note, pt verified that she was taking Crestor and refill was sent for that. Notified Juliann Pulse that pt is supposed to be taking Crestor.

## 2018-03-21 NOTE — ED Provider Notes (Signed)
Patient placed in Quick Look pathway, seen and evaluated   Chief Complaint: chest pain  HPI: Susan Whitaker is a 47 y.o. female who presents to the ED with chest pain. Patient reports seeing her PCP yesterday and then today symptoms got worse. Patient had to get help to get from the parking lot to the ED.  ROS: CV: chest pain  Physical Exam:  BP 94/79 (BP Location: Right Arm)   Pulse (!) 158   Resp (!) 22   LMP 03/20/2018   SpO2 97%    Gen: No distress  Neuro: Awake and Alert  Skin: Warm and dry   Patient taken directly to exam room.   Initiation of care has begun. The patient has been counseled on the process, plan, and necessity for staying for the completion/evaluation, and the remainder of the medical screening examination    Ashley Murrain, NP 03/21/18 1334    Dorie Rank, MD 03/21/18 1615

## 2018-03-21 NOTE — ED Provider Notes (Addendum)
[facility] EMERGENCY DEPARTMENT Provider Note   CSN: [id number] Arrival date & time: 03/21/18  1317     History   Chief Complaint Chief Complaint  Patient presents with  . Irregular Heart Beat    HPI Susan Whitaker is a 47 y.o. female.  HPI Patient presents to the emergency room for evaluation of tachycardia.  Patient states she has a history of SVT.  Normally she does not have any issues with that and just takes metoprolol.  She has noticed that her heart rate has been racing intermittently.  It seems to be happening more frequently.  Today her heart rate started racing and she could not get it to stop.  No shortness of breath.  Generalized weakness.  No fevers or chills. Past Medical History:  Diagnosis Date  . Allergy    allergic rhinitis  . Diabetes mellitus    type II  . FHx: hypertension   . Gastric ulcer   . GERD (gastroesophageal reflux disease)   . H/O constipation   . H/O hemorrhoids   . H/O varicella   . Hyperlipidemia   . Hypertension   . Hypokalemia   . Increased BMI 06/2010  . Primary hyperaldosteronism ([place]) 02/21/2012  . SVT (supraventricular tachycardia) ([place])    Noted 11/2011 admission  . Thrombocytopenia ([place])   . Thyroid fullness 08/2005  . Urinary incontinence     Patient Active Problem List   Diagnosis Date Noted  . Dog scratch 07/06/2017  . Cellulitis 07/06/2017  . Bacterial vaginosis 07/15/2016  . Blurred vision, bilateral 06/11/2014  . Non-suppurative otitis media 03/28/2014  . Late menses 03/28/2014  . Fatigue 04/13/2013  . Right hip pain 10/31/2012  . Anxiety state 07/14/2012  . Routine general medical examination at a health care facility 07/14/2012  . Primary hyperaldosteronism ([place]) 02/21/2012  . Microalbuminuria 02/15/2012  . Multiple thyroid nodules 01/04/2012  . SVT (supraventricular tachycardia) ([place]) 12/29/2011  . Hypokalemia 11/28/2011  . Cervical pain (neck) 05/28/2011  . EXTERNAL HEMORRHOIDS WITHOUT  MENTION COMP 02/07/2009  . Diabetes type 2, controlled ([place]) 02/07/2009  . ALLERGIC RHINITIS 05/24/2008  . Hyperlipemia 04/22/2008  . THROMBOCYTOSIS 04/22/2008  . URINARY INCONTINENCE 04/22/2008  . Essential hypertension 01/04/2007  . GERD 01/04/2007    Past Surgical History:  Procedure Laterality Date  . ABDOMINAL SURGERY     11 months of age-- unsure of type of surgery  . COLONOSCOPY  04/2016   hemorrhoids--normal per pt with Dr Collene Mares  . HEMORRHOID SURGERY  2005/2006  . UTERINE FIBROID EMBOLIZATION       OB History    Gravida  4   Para  2   Term  2   Preterm      AB  1   Living  2     SAB  1   TAB      Ectopic      Multiple      Live Births               Home Medications    Prior to Admission medications   Medication Sig Start Date End Date Taking? Authorizing Provider  AMBULATORY NON FORMULARY MEDICATION BORIC ACID CAPSULE 600mg . Insert 1 capsule vaginally two times a week. Patient taking differently: Place 600 mg vaginally See admin instructions. Boric acid capsules- 600 mg each: Insert 1 capsule (600 mg) per vagina two times a week as needed/as directed for yeast infections 07/13/16  Yes Debbrah Alar, NP  amLODipine (NORVASC) 10  MG tablet TAKE 1 TABLET BY MOUTH ONCE DAILY Patient taking differently: Take 10 mg by mouth daily.  01/17/18  Yes Debbrah Alar, NP  aspirin EC 81 MG tablet Take 1 tablet (81 mg total) by mouth daily. 08/11/15  Yes Debbrah Alar, NP  atorvastatin (LIPITOR) 20 MG tablet TAKE ONE TABLET BY MOUTH ONCE DAILY Patient taking differently: Take 20 mg by mouth daily at 6 PM.  03/04/17  Yes Debbrah Alar, NP  carvedilol (COREG) 25 MG tablet TAKE 1 TABLET BY MOUTH TWICE DAILY WITH A MEAL Patient taking differently: Take 25 mg by mouth 2 (two) times daily with a meal.  11/02/17  Yes Debbrah Alar, NP  INVOKANA 300 MG TABS tablet TAKE 1 TABLET BY MOUTH ONCE DAILY BEFORE BREAKFAST Patient taking differently: Take  300 mg by mouth daily before breakfast.  08/23/17  Yes Elayne Snare, MD  JANUVIA 100 MG tablet TAKE 1 TABLET BY MOUTH ONCE DAILY Patient taking differently: Take 100 mg by mouth daily.  06/27/17  Yes Debbrah Alar, NP  losartan (COZAAR) 100 MG tablet TAKE 1 TABLET BY MOUTH ONCE DAILY Patient taking differently: Take 100 mg by mouth daily.  03/10/18  Yes Debbrah Alar, NP  metFORMIN (GLUCOPHAGE-XR) 500 MG 24 hr tablet Take 1 tablet (500 mg total) by mouth 2 (two) times daily. 11/03/17  Yes Elayne Snare, MD  metoprolol tartrate (LOPRESSOR) 100 MG tablet TAKE 1 TABLET BY MOUTH TWICE DAILY Patient taking differently: Take 100 mg by mouth 2 (two) times daily.  06/27/17  Yes Debbrah Alar, NP  Polyethyl Glycol-Propyl Glycol (SYSTANE PRESERVATIVE FREE OP) Place 2 drops into both eyes 4 (four) times daily.   Yes [provider]  spironolactone (ALDACTONE) 100 MG tablet TAKE 1 TABLET BY MOUTH ONCE DAILY Patient taking differently: Take 100 mg by mouth daily.  03/10/18  Yes Debbrah Alar, NP  glucose blood (ACCU-CHEK GUIDE) test strip 1 each by Other route 3 (three) times daily. Use as instructed to check once daily. 11/10/17   Elayne Snare, MD  mupirocin ointment (BACTROBAN) 2 % Sig to affected area twice a day x 7 days. Patient not taking: Reported on 03/21/2018 07/06/17   Horald Pollen, MD  nystatin (NYSTATIN) powder Apply twice daily beneath breasts. Patient not taking: Reported on 03/21/2018 03/24/16   Debbrah Alar, NP  [facility] DELICA LANCETS FINE MISC Use to check blood sugar 3 times daily 09/28/16   Elayne Snare, MD  rosuvastatin (CRESTOR) 20 MG tablet Take 1 tablet (20 mg total) by mouth daily. Patient not taking: Reported on 03/21/2018 06/13/17   Debbrah Alar, NP    Family History Family History  Problem Relation Age of Onset  . Cancer Mother        breast  . Stroke Mother   . Cancer Maternal Grandmother        breast  . Hypertension Maternal  Grandmother   . Cancer Other        breast, lung  . Hypertension Other   . Stroke Other   . Vision loss Father   . Heart disease Father        Rhythm disturbance  . Diabetes Neg Hx     Social History Social History   Tobacco Use  . Smoking status: Never Smoker  . Smokeless tobacco: Never Used  Substance Use Topics  . Alcohol use: No  . Drug use: No     Allergies   Lisinopril   Review of Systems Review of Systems  All other  systems reviewed and are negative.    Physical Exam Updated Vital Signs BP 102/63   Pulse 85   Resp (!) 9   LMP 03/20/2018   SpO2 96%   Physical Exam  Constitutional: No distress.  HENT:  Head: Normocephalic and atraumatic.  Right Ear: External ear normal.  Left Ear: External ear normal.  Eyes: Conjunctivae are normal. Right eye exhibits no discharge. Left eye exhibits no discharge. No scleral icterus.  Neck: Neck supple. No tracheal deviation present.  Cardiovascular: Regular rhythm and intact distal pulses. Tachycardia present.  Pulmonary/Chest: Effort normal and breath sounds normal. No stridor. No respiratory distress. She has no wheezes. She has no rales.  Abdominal: Soft. Bowel sounds are normal. She exhibits no distension. There is no tenderness. There is no rebound and no guarding.  Musculoskeletal: She exhibits no edema or tenderness.  Neurological: She is alert. She has normal strength. No cranial nerve deficit (no facial droop, extraocular movements intact, no slurred speech) or sensory deficit. She exhibits normal muscle tone. She displays no seizure activity. Coordination normal.  Skin: Skin is warm. No rash noted. She is diaphoretic.  Psychiatric: She has a normal mood and affect.  Nursing note and vitals reviewed.    ED Treatments / Results  Labs (all labs ordered are listed, but only abnormal results are displayed) Labs Reviewed  I-STAT CHEM 8, ED - Abnormal; Notable for the following components:      Result Value    BUN 27 (*)    Creatinine, Ser 1.10 (*)    Glucose, Bld 252 (*)    Calcium, Ion 1.07 (*)    Hemoglobin 15.3 (*)    All other components within normal limits  MAGNESIUM  I-STAT TROPONIN, ED    EKG EKG Interpretation  Date/Time:  Tuesday March 21 2018 13:18:39 EST Ventricular Rate:  159 PR Interval:    QRS Duration: 84 QT Interval:  294 QTC Calculation: 478 R Axis:   74 Text Interpretation:  Supraventricular tachycardia Septal infarct , age undetermined Abnormal ECG Since last tracing rate faster Supraventricular tachycardia is new Confirmed by Dorie Rank [phone]) on 03/21/2018 1:44:42 PM   Radiology No results found.  Procedures .Cardioversion Date/Time: 03/21/2018 1:55 PM Performed by: Dorie Rank, MD Authorized by: Dorie Rank, MD   Consent:    Consent obtained:  Verbal   Consent given by:  Patient Pre-procedure details:    Cardioversion basis:  Emergent   Rhythm:  Supraventricular tachycardia Attempt one:    Shock outcome:  Conversion to normal sinus rhythm Post-procedure details:    Patient status:  Awake   Patient tolerance of procedure:  Tolerated well, no immediate complications Comments:     Chemical cardioversion with adenosine    .Critical Care Performed by: Dorie Rank, MD Authorized by: Dorie Rank, MD   Critical care provider statement:    Critical care time (minutes):  15   Critical care was time spent personally by me on the following activities:  Discussions with consultants, evaluation of patient's response to treatment, examination of patient, ordering and performing treatments and interventions, ordering and review of laboratory studies, ordering and review of radiographic studies, pulse oximetry, re-evaluation of patient's condition, obtaining history from patient or surrogate and review of old charts   (including critical care time)  Medications Ordered in ED Medications  adenosine (ADENOCARD) 6 MG/2ML injection (has no administration in time  range)  0.9 %  sodium chloride infusion ( Intravenous New Bag/Given 03/21/18 1348)  adenosine (ADENOCARD) 6 MG/2ML  injection 6 mg (6 mg Intravenous Given 03/21/18 1346)     Initial Impression / Assessment and Plan / ED Course  I have reviewed the triage vital signs and the nursing notes.  Pertinent labs & imaging results that were available during my care of the patient were reviewed by me and considered in my medical decision making (see chart for details).    Patient is EKG is consistent with supraventricular tachycardia.  I attempted carotid sinus massage and had the patient try a Valsalva maneuver without relief.  Patient was given 6 mg adenosine and she converted back to a sinus rhythm.  Pt was monitored in the ED.  No recurrent episodes.  Stable for discharge.  She will be seeing Dr Lovena Le.  Final Clinical Impressions(s) / ED Diagnoses   Final diagnoses:  SVT (supraventricular tachycardia) [facility])    ED Discharge Orders    None       Dorie Rank, MD 03/21/18 1554 Cc addednum   Dorie Rank, MD 05/08/18 [phone]

## 2018-03-21 NOTE — ED Notes (Signed)
Pt converted to sinus rhythm after 6mg  of adenosine given. Pt grateful and resting at this time. Will continue to monitor

## 2018-03-24 ENCOUNTER — Encounter: Payer: Self-pay | Admitting: Internal Medicine

## 2018-03-24 ENCOUNTER — Other Ambulatory Visit (INDEPENDENT_AMBULATORY_CARE_PROVIDER_SITE_OTHER): Payer: 59

## 2018-03-24 ENCOUNTER — Ambulatory Visit (INDEPENDENT_AMBULATORY_CARE_PROVIDER_SITE_OTHER): Payer: 59 | Admitting: Internal Medicine

## 2018-03-24 VITALS — BP 140/88 | HR 93 | Ht 63.0 in | Wt 219.0 lb

## 2018-03-24 DIAGNOSIS — I471 Supraventricular tachycardia: Secondary | ICD-10-CM

## 2018-03-24 DIAGNOSIS — E1165 Type 2 diabetes mellitus with hyperglycemia: Secondary | ICD-10-CM | POA: Diagnosis not present

## 2018-03-24 LAB — COMPREHENSIVE METABOLIC PANEL
ALT: 13 U/L (ref 0–35)
AST: 13 U/L (ref 0–37)
Albumin: 4.2 g/dL (ref 3.5–5.2)
Alkaline Phosphatase: 55 U/L (ref 39–117)
BUN: 26 mg/dL — ABNORMAL HIGH (ref 6–23)
CO2: 27 mEq/L (ref 19–32)
Calcium: 9.6 mg/dL (ref 8.4–10.5)
Chloride: 99 mEq/L (ref 96–112)
Creatinine, Ser: 0.92 mg/dL (ref 0.40–1.20)
GFR: 83.42 mL/min (ref >60.00)
Glucose, Bld: 126 mg/dL — ABNORMAL HIGH (ref 70–99)
Potassium: 3.8 mEq/L (ref 3.5–5.1)
Sodium: 134 mEq/L — ABNORMAL LOW (ref 135–145)
Total Bilirubin: 0.4 mg/dL (ref 0.2–1.2)
Total Protein: 8.3 g/dL (ref 6.0–8.3)

## 2018-03-24 LAB — HEMOGLOBIN A1C: Hgb A1c MFr Bld: 8.1 % — ABNORMAL HIGH (ref 4.6–6.5)

## 2018-03-24 NOTE — Patient Instructions (Addendum)
Medication Instructions:  Your physician recommends that you continue on your current medications as directed. Please refer to the Current Medication list given to you today.  Labwork: None ordered.  Testing/Procedures: Your physician has recommended that you have an ablation. Catheter ablation is a medical procedure used to treat some cardiac arrhythmias (irregular heartbeats). During catheter ablation, a long, thin, flexible tube is put into a blood vessel in your groin (upper thigh), or neck. This tube is called an ablation catheter. It is then guided to your heart through the blood vessel. Radio frequency waves destroy small areas of heart tissue where abnormal heartbeats may cause an arrhythmia to start. Please see the instruction sheet given to you today.  Follow-Up:  November 18, 19, 25, 26 December 3, 6, 10, 13, 16, 19, 31  If you decide to go ahead with the procedure please call me: Myrtie Hawk, RN with Dr. Lovena Le [phone]    Cardiac Ablation Cardiac ablation is a procedure to disable (ablate) a small amount of heart tissue in very specific places. The heart has many electrical connections. Sometimes these connections are abnormal and can cause the heart to beat very fast or irregularly. Ablating some of the problem areas can improve the heart rhythm or return it to normal. Ablation may be done for people who:  Have Wolff-Parkinson-White syndrome.  Have fast heart rhythms (tachycardia).  Have taken medicines for an abnormal heart rhythm (arrhythmia) that were not effective or caused side effects.  Have a high-risk heartbeat that may be life-threatening.  During the procedure, a small incision is made in the neck or the groin, and a long, thin, flexible tube (catheter) is inserted into the incision and moved to the heart. Small devices (electrodes) on the tip of the catheter will send out electrical currents. A type of X-ray (fluoroscopy) will be used to help guide the  catheter and to provide images of the heart. Tell a health care provider about:  Any allergies you have.  All medicines you are taking, including vitamins, herbs, eye drops, creams, and over-the-counter medicines.  Any problems you or family members have had with anesthetic medicines.  Any blood disorders you have.  Any surgeries you have had.  Any medical conditions you have, such as kidney failure.  Whether you are pregnant or may be pregnant. What are the risks? Generally, this is a safe procedure. However, problems may occur, including:  Infection.  Bruising and bleeding at the catheter insertion site.  Bleeding into the chest, especially into the sac that surrounds the heart. This is a serious complication.  Stroke or blood clots.  Damage to other structures or organs.  Allergic reaction to medicines or dyes.  Need for a permanent pacemaker if the normal electrical system is damaged. A pacemaker is a small computer that sends electrical signals to the heart and helps your heart beat normally.  The procedure not being fully effective. This may not be recognized until months later. Repeat ablation procedures are sometimes required.  What happens before the procedure?  Follow instructions from your health care provider about eating or drinking restrictions.  Ask your health care provider about: ? Changing or stopping your regular medicines. This is especially important if you are taking diabetes medicines or blood thinners. ? Taking medicines such as aspirin and ibuprofen. These medicines can thin your blood. Do not take these medicines before your procedure if your health care provider instructs you not to.  Plan to have someone take you home from  the hospital or clinic.  If you will be going home right after the procedure, plan to have someone with you for 24 hours. What happens during the procedure?  To lower your risk of infection: ? Your health care team will  wash or sanitize their hands. ? Your skin will be washed with soap. ? Hair may be removed from the incision area.  An IV tube will be inserted into one of your veins.  You will be given a medicine to help you relax (sedative).  The skin on your neck or groin will be numbed.  An incision will be made in your neck or your groin.  A needle will be inserted through the incision and into a large vein in your neck or groin.  A catheter will be inserted into the needle and moved to your heart.  Dye may be injected through the catheter to help your surgeon see the area of the heart that needs treatment.  Electrical currents will be sent from the catheter to ablate heart tissue in desired areas. There are three types of energy that may be used to ablate heart tissue: ? Heat (radiofrequency energy). ? Laser energy. ? Extreme cold (cryoablation).  When the necessary tissue has been ablated, the catheter will be removed.  Pressure will be held on the catheter insertion area to prevent excessive bleeding.  A bandage (dressing) will be placed over the catheter insertion area. The procedure may vary among health care providers and hospitals. What happens after the procedure?  Your blood pressure, heart rate, breathing rate, and blood oxygen level will be monitored until the medicines you were given have worn off.  Your catheter insertion area will be monitored for bleeding. You will need to lie still for a few hours to ensure that you do not bleed from the catheter insertion area.  Do not drive for 24 hours or as long as directed by your health care provider. Summary  Cardiac ablation is a procedure to disable (ablate) a small amount of heart tissue in very specific places. Ablating some of the problem areas can improve the heart rhythm or return it to normal.  During the procedure, electrical currents will be sent from the catheter to ablate heart tissue in desired areas. This information  is not intended to replace advice given to you by your health care provider. Make sure you discuss any questions you have with your health care provider. Document Released: 09/19/2008 Document Revised: 03/22/2016 Document Reviewed: 03/22/2016 Elsevier Interactive Patient Education  [employer].

## 2018-03-24 NOTE — Progress Notes (Signed)
HPI Ms. Rody returns after a 6 year absence from our clinic. She is a pleasant 47 yo woman with a h/o SVT who has been treated with beta blockers who has had worsening symptoms. She was in the hospital with SVT requiring IV adenosine. She has changed her lifestyle to try and tried to keep from having SVT but to no avail. When she goes into SVT she feels sob and lightheaded. She has had documented SVT at 170/min.  Allergies  Allergen Reactions  . Lisinopril Cough     Current Outpatient Medications  Medication Sig Dispense Refill  . AMBULATORY NON FORMULARY MEDICATION BORIC ACID CAPSULE 600mg . Insert 1 capsule vaginally two times a week. (Patient taking differently: Place 600 mg vaginally See admin instructions. Boric acid capsules- 600 mg each: Insert 1 capsule (600 mg) per vagina two times a week as needed/as directed for yeast infections) 8 capsule 5  . amLODipine (NORVASC) 10 MG tablet TAKE 1 TABLET BY MOUTH ONCE DAILY (Patient taking differently: Take 10 mg by mouth daily. ) 90 tablet 0  . aspirin EC 81 MG tablet Take 1 tablet (81 mg total) by mouth daily.    . carvedilol (COREG) 25 MG tablet TAKE 1 TABLET BY MOUTH TWICE DAILY WITH A MEAL (Patient taking differently: Take 25 mg by mouth 2 (two) times daily with a meal. ) 180 tablet 0  . glucose blood (ACCU-CHEK GUIDE) test strip 1 each by Other route 3 (three) times daily. Use as instructed to check once daily. 100 each 12  . INVOKANA 300 MG TABS tablet TAKE 1 TABLET BY MOUTH ONCE DAILY BEFORE BREAKFAST (Patient taking differently: Take 300 mg by mouth daily before breakfast. ) 90 tablet 1  . JANUVIA 100 MG tablet TAKE 1 TABLET BY MOUTH ONCE DAILY (Patient taking differently: Take 100 mg by mouth daily. ) 90 tablet 1  . losartan (COZAAR) 100 MG tablet TAKE 1 TABLET BY MOUTH ONCE DAILY (Patient taking differently: Take 100 mg by mouth daily. ) 30 tablet 0  . metFORMIN (GLUCOPHAGE-XR) 500 MG 24 hr tablet Take 1 tablet (500 mg total) by  mouth 2 (two) times daily. 180 tablet 1  . metoprolol tartrate (LOPRESSOR) 100 MG tablet TAKE 1 TABLET BY MOUTH TWICE DAILY (Patient taking differently: Take 100 mg by mouth 2 (two) times daily. ) 180 tablet 1  . mupirocin ointment (BACTROBAN) 2 % Sig to affected area twice a day x 7 days. 22 g 1  . nystatin (NYSTATIN) powder Apply twice daily beneath breasts. 30 g 1  . ONETOUCH DELICA LANCETS FINE MISC Use to check blood sugar 3 times daily 100 each 3  . Polyethyl Glycol-Propyl Glycol (SYSTANE PRESERVATIVE FREE OP) Place 2 drops into both eyes 4 (four) times daily.    . rosuvastatin (CRESTOR) 20 MG tablet Take 1 tablet (20 mg total) by mouth daily. 90 tablet 1  . spironolactone (ALDACTONE) 100 MG tablet TAKE 1 TABLET BY MOUTH ONCE DAILY (Patient taking differently: Take 100 mg by mouth daily. ) 30 tablet 0   No current facility-administered medications for this visit.      Past Medical History:  Diagnosis Date  . Allergy    allergic rhinitis  . Diabetes mellitus    type II  . FHx: hypertension   . Gastric ulcer   . GERD (gastroesophageal reflux disease)   . H/O constipation   . H/O hemorrhoids   . H/O varicella   . Hyperlipidemia   .  Hypertension   . Hypokalemia   . Increased BMI 06/2010  . Primary hyperaldosteronism ([place]) 02/21/2012  . SVT (supraventricular tachycardia) ([place])    Noted 11/2011 admission  . Thrombocytopenia ([place])   . Thyroid fullness 08/2005  . Urinary incontinence     ROS:   All systems reviewed and negative except as noted in the HPI.   Past Surgical History:  Procedure Laterality Date  . ABDOMINAL SURGERY     21 months of age-- unsure of type of surgery  . COLONOSCOPY  04/2016   hemorrhoids--normal per pt with Dr Collene Mares  . HEMORRHOID SURGERY  2005/2006  . UTERINE FIBROID EMBOLIZATION       Family History  Problem Relation Age of Onset  . Cancer Mother        breast  . Stroke Mother   . Cancer Maternal Grandmother        breast  . Hypertension  Maternal Grandmother   . Cancer Other        breast, lung  . Hypertension Other   . Stroke Other   . Vision loss Father   . Heart disease Father        Rhythm disturbance  . Diabetes Neg Hx      Social History   Socioeconomic History  . Marital status: Single    Spouse name: Not on file  . Number of children: 2  . Years of education: Not on file  . Highest education level: Not on file  Occupational History  . Occupation: YOUTH EMPLOYMENT    Employer: [employer])  Social Needs  . Financial resource strain: Not on file  . Food insecurity:    Worry: Not on file    Inability: Not on file  . Transportation needs:    Medical: Not on file    Non-medical: Not on file  Tobacco Use  . Smoking status: Never Smoker  . Smokeless tobacco: Never Used  Substance and Sexual Activity  . Alcohol use: No  . Drug use: No  . Sexual activity: Never    Partners: Male    Birth control/protection: None  Lifestyle  . Physical activity:    Days per week: Not on file    Minutes per session: Not on file  . Stress: Not on file  Relationships  . Social connections:    Talks on phone: Not on file    Gets together: Not on file    Attends religious service: Not on file    Active member of club or organization: Not on file    Attends meetings of clubs or organizations: Not on file    Relationship status: Not on file  . Intimate partner violence:    Fear of current or ex partner: Not on file    Emotionally abused: Not on file    Physically abused: Not on file    Forced sexual activity: Not on file  Other Topics Concern  . Not on file  Social History Narrative   Works at [employer]           BP 140/88   Pulse 93   Ht 5\' 3"  (1.6 m)   Wt 219 lb (99.3 kg)   LMP 03/20/2018   SpO2 95%   BMI 38.79 kg/m   Physical Exam:  Well appearing NAD HEENT: Unremarkable Neck:  No JVD, no thyromegally Lymphatics:  No adenopathy Back:  No CVA tenderness Lungs:  Clear HEART:  Regular  rate rhythm, no murmurs, no rubs, no  clicks Abd:  soft, positive bowel sounds, no organomegally, no rebound, no guarding Ext:  2 plus pulses, no edema, no cyanosis, no clubbing Skin:  No rashes no nodules Neuro:  CN II through XII intact, motor grossly intact  EKG - none   Assess/Plan: 1. SVT - I have discussed the treatment options with the patient and the risks/benefit/goals and expectations of the procedure were discussed and she will call us for ablation. 2. Obesity - she is encouraged to eat less and exercise.  Mikle Bosworth.D.

## 2018-03-27 ENCOUNTER — Other Ambulatory Visit: Payer: Self-pay | Admitting: Family

## 2018-03-27 ENCOUNTER — Telehealth: Payer: Self-pay | Admitting: Family

## 2018-03-27 ENCOUNTER — Ambulatory Visit (INDEPENDENT_AMBULATORY_CARE_PROVIDER_SITE_OTHER): Payer: 59 | Admitting: Endocrinology

## 2018-03-27 ENCOUNTER — Encounter: Payer: Self-pay | Admitting: Endocrinology

## 2018-03-27 VITALS — BP 120/68 | HR 64 | Ht 62.0 in | Wt 218.0 lb

## 2018-03-27 DIAGNOSIS — E1165 Type 2 diabetes mellitus with hyperglycemia: Secondary | ICD-10-CM | POA: Diagnosis not present

## 2018-03-27 MED ORDER — FLUCONAZOLE 150 MG PO TABS: ORAL_TABLET | ORAL | 0 refills | Status: DC

## 2018-03-27 MED ORDER — DULAGLUTIDE 0.75 MG/0.5ML ~~LOC~~ SOAJ: SUBCUTANEOUS | 1 refills | Status: DC

## 2018-03-27 NOTE — Telephone Encounter (Signed)
Copied from [place] [id number]. Topic: Quick Communication - Rx Refill/Question >> Mar 27, 2018  2:08 PM Leward Quan A wrote: Medication: fluconazole (DIFLUCAN) 150 MG tablet   Has the patient contacted their pharmacy? Yes.     Preferred Pharmacy (with phone number or street name): [facility] [place]. [phone] (Phone) [phone] (Fax)    Agent: Please be advised that RX refills may take up to 3 business days. We ask that you follow-up with your pharmacy.

## 2018-03-27 NOTE — Telephone Encounter (Signed)
Copied from [place] [id number]. Topic: Quick Communication - Rx Refill/Question >> Mar 27, 2018  6:13 PM Waylan Rocher, [place] L wrote: Medication: rosuvastatin (CRESTOR) 20 MG tablet   Has the patient contacted their pharmacy? Yes.   (Agent: If no, request that the patient contact the pharmacy for the refill.) (Agent: If yes, when and what did the pharmacy advise?) pharmacy said script is outdated  Preferred Pharmacy (with phone number or street name): [facility] [address] [place]. [place] [address] Phone: [phone] Fax: [phone]  Agent: Please be advised that RX refills may take up to 3 business days. We ask that you follow-up with your pharmacy.

## 2018-03-27 NOTE — Patient Instructions (Addendum)
Check blood sugars on waking up 3 days a week  Also check blood sugars about 2 hours after meals and do this after different meals by rotation  Recommended blood sugar levels on waking up are 90-130 and about 2 hours after meal is 130-160  Please bring your blood sugar monitor to each visit, thank you  Start TRULICITYwith the pen as shown once weekly on the same day of the week.   You may inject in the stomach, thigh or arm as indicated in the brochure given.  You will feel fullness of the stomach with starting the medication and should try to keep the portions at meals small.  You may experience nausea in the first few days which usually gets better over time    If any questions or concerns are present call the office or the  [facility] at [phone].  Also visit [email or web address] website for more useful information  Stop Januvia on week 2

## 2018-03-27 NOTE — Telephone Encounter (Signed)
See 03/27/18 phone note.

## 2018-03-27 NOTE — Telephone Encounter (Signed)
Patient is requesting refill of Diflucan- she is having itching for for a day. Some frequency- patient has been swimming recently. Patient states she is off work today and would like to go ahead and get started with treatment. Patient uses [facility] on [address].  Patient states she did have 1 day of glucose: 252. She ended up in ED with SVT the next day- she has had appointment with cardiologist.( She is trying to decide if she wants to have surgery)  Rx request: Diflucan 150 mg  LOV: 03/20/18  PCP: [place]: verified

## 2018-03-27 NOTE — Progress Notes (Addendum)
Patient ID: Susan Whitaker, female   DOB: 06-16-1970, 47 y.o.   MRN: [id number]           Reason for Appointment: Follow-up for Type 2 Diabetes  Referring physician:  Debbrah Alar   History of Present Illness:          Date of diagnosis of type 2 diabetes mellitus: 2013        Background history:  She was diagnosed to have diabetes when she was having fibroid surgery. A1c in 2013 was 6.9 She was treated with metformin but she says she was not able to take this because of diarrhea and would be irregular with it However her blood sugars stayed about the same until 2015 when they were higher and glipizide added Her blood sugars had been much higher since about late 2015 with A1c in the 9-10 range with her taking glipizide alone  Recent history:   Non-insulin hypoglycemic drugs the patient is taking are: Invokana 300 mg daily, Januvia 100 mg daily, metformin ER 500 mg 1x daily   Her A1c has gone up and is now 8.1 compared to 7.4  Previous range 6.7-7.6  Current management, blood sugar patterns and problems identified:  She again has not been regular as directed for her follow-up  Also not checking her blood sugar much and does not have any motivation to do so, she thinks she will do better if she has a freestyle Libre tested  Also because of her cardiac issues and palpitations she has not exercised as before  Blood sugar was 252 nonfasting when she had her labs in the afternoon at the ER but only 126 fasting recently  She is trying to take her medications consistently without any side effects  However she takes her metformin only in the evening and will not take more than 1 tablet for fear of having diarrhea  Diet has been inconsistent and not planning well with eating out at lunch periodically  No side effects with the Invokana  Side effects from medications have been: Diarrhea from regular metformin  Compliance with the medical regimen: Improving  Glucose  monitoring:   Less than once a day recently , previously done up to 2 times a day         Glucometer:       Blood Glucose readings not available   Self-care: The diet that the patient has been following is: tries to limit Portions .     Typical meal intake: Breakfast is  fruit, Activia Yogurt,  For snacks she will have Snack box, chicken breast at dinnertime.  Avoiding Sweet drinks                 Dietician visit, most recent: 5/17, has  had diabetes education classes               Exercise: Walking 5/7; playing golf  Weight history:  Wt Readings from Last 3 Encounters:  03/27/18 218 lb (98.9 kg)  03/24/18 219 lb (99.3 kg)  03/20/18 220 lb 6.4 oz (100 kg)    Glycemic control:   Lab Results  Component Value Date   HGBA1C 8.1 (H) 03/24/2018   HGBA1C 7.4 (H) 11/07/2017   HGBA1C 7.6 (H) 04/29/2017   Lab Results  Component Value Date   MICROALBUR 1.5 11/07/2017   LDLCALC 85 07/25/2017   CREATININE 0.92 03/24/2018   Lab Results  Component Value Date   MICRALBCREAT 2.2 11/07/2017   Lab on 03/24/2018  Component Date Value Ref Range Status  . Sodium 03/24/2018 134* 135 - 145 mEq/L Final  . Potassium 03/24/2018 3.8  3.5 - 5.1 mEq/L Final  . Chloride 03/24/2018 99  96 - 112 mEq/L Final  . CO2 03/24/2018 27  19 - 32 mEq/L Final  . Glucose, Bld 03/24/2018 126* 70 - 99 mg/dL Final  . BUN 03/24/2018 26* 6 - 23 mg/dL Final  . Creatinine, Ser 03/24/2018 0.92  0.40 - 1.20 mg/dL Final  . Total Bilirubin 03/24/2018 0.4  0.2 - 1.2 mg/dL Final  . Alkaline Phosphatase 03/24/2018 55  39 - 117 U/L Final  . AST 03/24/2018 13  0 - 37 U/L Final  . ALT 03/24/2018 13  0 - 35 U/L Final  . Total Protein 03/24/2018 8.3  6.0 - 8.3 g/dL Final  . Albumin 03/24/2018 4.2  3.5 - 5.2 g/dL Final  . Calcium 03/24/2018 9.6  8.4 - 10.5 mg/dL Final  . GFR 03/24/2018 83.42  >60.00 mL/min Final  . Hgb A1c MFr Bld 03/24/2018 8.1* 4.6 - 6.5 % Final   Glycemic Control Guidelines for People with Diabetes:Non  Diabetic:  <6%Goal of Therapy: <7%Additional Action Suggested:  >8%   Admission on 03/21/2018, Discharged on 03/21/2018  Component Date Value Ref Range Status  . Troponin i, poc 03/21/2018 0.01  0.00 - 0.08 ng/mL Final  . Comment 3 03/21/2018          Final   Comment: Due to the release kinetics of cTnI, a negative result within the first hours of the onset of symptoms does not rule out myocardial infarction with certainty. If myocardial infarction is still suspected, repeat the test at appropriate intervals.   . Sodium 03/21/2018 138  135 - 145 mmol/L Final  . Potassium 03/21/2018 5.1  3.5 - 5.1 mmol/L Final  . Chloride 03/21/2018 107  98 - 111 mmol/L Final  . BUN 03/21/2018 27* 6 - 20 mg/dL Final  . Creatinine, Ser 03/21/2018 1.10* 0.44 - 1.00 mg/dL Final  . Glucose, Bld 03/21/2018 252* 70 - 99 mg/dL Final  . Calcium, Ion 03/21/2018 1.07* 1.15 - 1.40 mmol/L Final  . TCO2 03/21/2018 24  22 - 32 mmol/L Final  . Hemoglobin 03/21/2018 15.3* 12.0 - 15.0 g/dL Final  . HCT 03/21/2018 45.0  36.0 - 46.0 % Final  . Magnesium 03/21/2018 2.3  1.7 - 2.4 mg/dL Final   Comment: SLIGHT HEMOLYSIS Performed at [facility], [place] [address] [place], [place] [address]      Other active problems are in review of systems    Allergies as of 03/27/2018      Reactions   Lisinopril Cough      Medication List        Accurate as of 03/27/18 11:37 AM. Always use your most recent med list.          AMBULATORY NON FORMULARY MEDICATION BORIC ACID CAPSULE 600mg . Insert 1 capsule vaginally two times a week.   amLODipine 10 MG tablet Commonly known as:  NORVASC TAKE 1 TABLET BY MOUTH ONCE DAILY   aspirin EC 81 MG tablet Take 1 tablet (81 mg total) by mouth daily.   carvedilol 25 MG tablet Commonly known as:  COREG TAKE 1 TABLET BY MOUTH TWICE DAILY WITH A MEAL   glucose blood test strip 1 each by Other route 3 (three) times daily. Use as instructed to check once daily.   INVOKANA  300 MG Tabs tablet Generic drug:  canagliflozin TAKE 1  TABLET BY MOUTH ONCE DAILY BEFORE BREAKFAST   JANUVIA 100 MG tablet Generic drug:  sitaGLIPtin TAKE 1 TABLET BY MOUTH ONCE DAILY   losartan 100 MG tablet Commonly known as:  COZAAR TAKE 1 TABLET BY MOUTH ONCE DAILY   metFORMIN 500 MG 24 hr tablet Commonly known as:  GLUCOPHAGE-XR Take 1 tablet (500 mg total) by mouth 2 (two) times daily.   metoprolol tartrate 100 MG tablet Commonly known as:  LOPRESSOR TAKE 1 TABLET BY MOUTH TWICE DAILY   mupirocin ointment 2 % Commonly known as:  BACTROBAN Sig to affected area twice a day x 7 days.   nystatin powder Commonly known as:  MYCOSTATIN/NYSTOP Apply twice daily beneath breasts.   ONETOUCH DELICA LANCETS FINE Misc Use to check blood sugar 3 times daily   rosuvastatin 20 MG tablet Commonly known as:  CRESTOR Take 1 tablet (20 mg total) by mouth daily.   spironolactone 100 MG tablet Commonly known as:  ALDACTONE TAKE 1 TABLET BY MOUTH ONCE DAILY   SYSTANE PRESERVATIVE FREE OP Place 2 drops into both eyes 4 (four) times daily.       Allergies:  Allergies  Allergen Reactions  . Lisinopril Cough    Past Medical History:  Diagnosis Date  . Allergy    allergic rhinitis  . Diabetes mellitus    type II  . FHx: hypertension   . Gastric ulcer   . GERD (gastroesophageal reflux disease)   . H/O constipation   . H/O hemorrhoids   . H/O varicella   . Hyperlipidemia   . Hypertension   . Hypokalemia   . Increased BMI 06/2010  . Primary hyperaldosteronism ([place]) 02/21/2012  . SVT (supraventricular tachycardia) ([place])    Noted 11/2011 admission  . Thrombocytopenia ([place])   . Thyroid fullness 08/2005  . Urinary incontinence     Past Surgical History:  Procedure Laterality Date  . ABDOMINAL SURGERY     82 months of age-- unsure of type of surgery  . COLONOSCOPY  04/2016   hemorrhoids--normal per pt with Dr Collene Mares  . HEMORRHOID SURGERY  2005/2006  . UTERINE FIBROID  EMBOLIZATION      Family History  Problem Relation Age of Onset  . Cancer Mother        breast  . Stroke Mother   . Cancer Maternal Grandmother        breast  . Hypertension Maternal Grandmother   . Cancer Other        breast, lung  . Hypertension Other   . Stroke Other   . Vision loss Father   . Heart disease Father        Rhythm disturbance  . Diabetes Neg Hx     Social History:  reports that she has never smoked. She has never used smokeless tobacco. She reports that she does not drink alcohol or use drugs.    Review of Systems    Lipid history: LDL 171 At baseline Taking Lipitor 20 mg for this    Lab Results  Component Value Date   CHOL 139 07/25/2017   HDL 42.00 07/25/2017   LDLCALC 85 07/25/2017   LDLDIRECT 103.1 03/04/2014   TRIG 64.0 07/25/2017   CHOLHDL 3 07/25/2017           Hypertension:   She is on amlodipine, metoprolol and losartan Because of hypokalemia she was also given 100 mg of Aldactone  No further hypokalemia     BP Readings from Last 3 Encounters:  03/27/18  120/68  03/24/18 140/88  03/21/18 102/63    Lab Results  Component Value Date   CREATININE 0.92 03/24/2018   BUN 26 (H) 03/24/2018   NA 134 (L) 03/24/2018   K 3.8 03/24/2018   CL 99 03/24/2018   CO2 27 03/24/2018     History of goiter with multiple nodules, stable as of 2015, largest 1.4 cm nodule in the right side   Most recent foot exam: 7/18   She is due to have possible surgery for recurrent SVT   Review of Systems    Lab on 03/24/2018  Component Date Value Ref Range Status  . Sodium 03/24/2018 134* 135 - 145 mEq/L Final  . Potassium 03/24/2018 3.8  3.5 - 5.1 mEq/L Final  . Chloride 03/24/2018 99  96 - 112 mEq/L Final  . CO2 03/24/2018 27  19 - 32 mEq/L Final  . Glucose, Bld 03/24/2018 126* 70 - 99 mg/dL Final  . BUN 03/24/2018 26* 6 - 23 mg/dL Final  . Creatinine, Ser 03/24/2018 0.92  0.40 - 1.20 mg/dL Final  . Total Bilirubin 03/24/2018 0.4  0.2 - 1.2  mg/dL Final  . Alkaline Phosphatase 03/24/2018 55  39 - 117 U/L Final  . AST 03/24/2018 13  0 - 37 U/L Final  . ALT 03/24/2018 13  0 - 35 U/L Final  . Total Protein 03/24/2018 8.3  6.0 - 8.3 g/dL Final  . Albumin 03/24/2018 4.2  3.5 - 5.2 g/dL Final  . Calcium 03/24/2018 9.6  8.4 - 10.5 mg/dL Final  . GFR 03/24/2018 83.42  >60.00 mL/min Final  . Hgb A1c MFr Bld 03/24/2018 8.1* 4.6 - 6.5 % Final   Glycemic Control Guidelines for People with Diabetes:Non Diabetic:  <6%Goal of Therapy: <7%Additional Action Suggested:  >8%   Admission on 03/21/2018, Discharged on 03/21/2018  Component Date Value Ref Range Status  . Troponin i, poc 03/21/2018 0.01  0.00 - 0.08 ng/mL Final  . Comment 3 03/21/2018          Final   Comment: Due to the release kinetics of cTnI, a negative result within the first hours of the onset of symptoms does not rule out myocardial infarction with certainty. If myocardial infarction is still suspected, repeat the test at appropriate intervals.   . Sodium 03/21/2018 138  135 - 145 mmol/L Final  . Potassium 03/21/2018 5.1  3.5 - 5.1 mmol/L Final  . Chloride 03/21/2018 107  98 - 111 mmol/L Final  . BUN 03/21/2018 27* 6 - 20 mg/dL Final  . Creatinine, Ser 03/21/2018 1.10* 0.44 - 1.00 mg/dL Final  . Glucose, Bld 03/21/2018 252* 70 - 99 mg/dL Final  . Calcium, Ion 03/21/2018 1.07* 1.15 - 1.40 mmol/L Final  . TCO2 03/21/2018 24  22 - 32 mmol/L Final  . Hemoglobin 03/21/2018 15.3* 12.0 - 15.0 g/dL Final  . HCT 03/21/2018 45.0  36.0 - 46.0 % Final  . Magnesium 03/21/2018 2.3  1.7 - 2.4 mg/dL Final   Comment: SLIGHT HEMOLYSIS Performed at [facility], [place] [address], [place],  [address]     Physical Examination:  BP 120/68   Pulse 64   Ht 5\' 2"  (1.575 m)   Wt 218 lb (98.9 kg)   LMP 03/20/2018   SpO2 97%   BMI 39.87 kg/m       ASSESSMENT/PLAN:  Diabetes type 2, obese  See history of present illness for detailed discussion of current diabetes  management, blood sugar patterns and problems identified  Her A1c is higher at 8.1  She is on a 3 regimen with Januvia, Invokana 300 mg and low-dose metformin ER  She has not done well since her last visit with no monitoring, no exercise and probably not being consistent with diet She has not been motivated much but also limited because of her history of palpitations is currently not exercising She likely has high postprandial readings since her fasting reading was in the 120s in the lab Has not lost weight  She is a good candidate for a GLP-1 drug  Discussed with the patient the nature of GLP-1 drugs, the action on various organ systems, improved satiety, how they benefit blood glucose control, as well as the benefit of weight loss. Explained possible side effects of TRULICITY, most commonly nausea that usually improves over time; discussed safety information in package insert.  Demonstrated the medication injection device and injection technique to the patient.  Showed patient possible injection sites To start with 0.75 mg dosage weekly for the first 6 injections and then reassess dose  Patient brochure on Trulicity with enclosed co-pay card given  She will try to check her blood sugars regularly Also she will look into the coverage for freestyle libre sensor  Follow-up in 6 weeks  HYPERTENSION:  This is controlled and hypokalemia is under control with 100 mg of Aldactone     There are no Patient Instructions on file for this visit.  Elayne Snare 03/27/2018, 11:37 AM   Note: This office note was prepared with Dragon voice recognition system technology. Any transcriptional errors that result from this process are unintentional.

## 2018-03-28 MED ORDER — ROSUVASTATIN CALCIUM 20 MG PO TABS: 20.0000 mg | ORAL_TABLET | Freq: Every day | ORAL | 0 refills | Status: DC

## 2018-03-28 NOTE — Telephone Encounter (Signed)
Requested Prescriptions  Pending Prescriptions Disp Refills  . rosuvastatin (CRESTOR) 20 MG tablet 90 tablet 0    Sig: Take 1 tablet (20 mg total) by mouth daily.     Cardiovascular:  Antilipid - Statins Passed - 03/27/2018  6:20 PM      Passed - Total Cholesterol in normal range and within 360 days    Cholesterol  Date Value Ref Range Status  07/25/2017 139 0 - 200 mg/dL Final    Comment:    ATP III Classification       Desirable:  < 200 mg/dL               Borderline High:  200 - 239 mg/dL          High:  > = 240 mg/dL         Passed - LDL in normal range and within 360 days    LDL Cholesterol  Date Value Ref Range Status  07/25/2017 85 0 - 99 mg/dL Final         Passed - HDL in normal range and within 360 days    HDL  Date Value Ref Range Status  07/25/2017 42.00 >39.00 mg/dL Final         Passed - Triglycerides in normal range and within 360 days    Triglycerides  Date Value Ref Range Status  07/25/2017 64.0 0.0 - 149.0 mg/dL Final    Comment:    Normal:  <150 mg/dLBorderline High:  150 - 199 mg/dL         Passed - Patient is not pregnant      Passed - Valid encounter within last 12 months    Recent Outpatient Visits          1 week ago SVT (supraventricular tachycardia) ([place])   Archivist at [facility], NP   8 months ago Annual physical exam   Archivist at [place], NP   8 months ago 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

## 2018-04-22 ENCOUNTER — Other Ambulatory Visit: Payer: Self-pay | Admitting: Family

## 2018-05-03 ENCOUNTER — Other Ambulatory Visit (INDEPENDENT_AMBULATORY_CARE_PROVIDER_SITE_OTHER): Payer: 59

## 2018-05-03 DIAGNOSIS — E1165 Type 2 diabetes mellitus with hyperglycemia: Secondary | ICD-10-CM

## 2018-05-03 LAB — BASIC METABOLIC PANEL
BUN: 22 mg/dL (ref 6–23)
CO2: 26 mEq/L (ref 19–32)
Calcium: 9.9 mg/dL (ref 8.4–10.5)
Chloride: 102 mEq/L (ref 96–112)
Creatinine, Ser: 1.16 mg/dL (ref 0.40–1.20)
GFR: 63.81 mL/min (ref >60.00)
Glucose, Bld: 97 mg/dL (ref 70–99)
Potassium: 4.1 mEq/L (ref 3.5–5.1)
Sodium: 136 mEq/L (ref 135–145)

## 2018-05-04 LAB — FRUCTOSAMINE: Fructosamine: 241 umol/L (ref 0–285)

## 2018-05-09 ENCOUNTER — Ambulatory Visit: Payer: Self-pay | Admitting: Endocrinology

## 2018-05-16 ENCOUNTER — Other Ambulatory Visit: Payer: Self-pay | Admitting: Endocrinology

## 2018-05-16 ENCOUNTER — Other Ambulatory Visit: Payer: Self-pay | Admitting: Family

## 2018-05-23 ENCOUNTER — Other Ambulatory Visit: Payer: Self-pay

## 2018-05-23 ENCOUNTER — Telehealth: Payer: Self-pay | Admitting: Endocrinology

## 2018-05-23 MED ORDER — DULAGLUTIDE 0.75 MG/0.5ML ~~LOC~~ SOAJ: SUBCUTANEOUS | 11 refills | Status: DC

## 2018-05-23 NOTE — Telephone Encounter (Signed)
RX sent

## 2018-05-23 NOTE — Telephone Encounter (Signed)
MEDICATION: Dulaglutide (TRULICITY) 8.83 GP/4.9IY SOPN  PHARMACY:  [facility], [place].  IS THIS A 90 DAY SUPPLY : would like a 90 day supply if possible   IS PATIENT OUT OF MEDICATION:  yes  IF NOT; HOW MUCH IS LEFT:   LAST APPOINTMENT DATE: @12 /31/2019  NEXT APPOINTMENT DATE:@1 /27/2020  DO WE HAVE YOUR PERMISSION TO LEAVE A DETAILED MESSAGE:  OTHER COMMENTS:    **Let patient know to contact pharmacy at the end of the day to make sure medication is ready. **  ** Please notify patient to allow 48-72 hours to process**  **Encourage patient to contact the pharmacy for refills or they can request refills through [facility]**

## 2018-05-29 ENCOUNTER — Encounter: Payer: Self-pay | Admitting: Family

## 2018-05-29 ENCOUNTER — Ambulatory Visit: Payer: 59 | Admitting: Family

## 2018-05-29 VITALS — BP 141/83 | HR 82 | Temp 98.8°F | Resp 16 | Ht 62.0 in | Wt 204.0 lb

## 2018-05-29 DIAGNOSIS — N6452 Nipple discharge: Secondary | ICD-10-CM | POA: Diagnosis not present

## 2018-05-29 DIAGNOSIS — N61 Mastitis without abscess: Secondary | ICD-10-CM

## 2018-05-29 MED ORDER — CEPHALEXIN 500 MG PO CAPS: 500.0000 mg | ORAL_CAPSULE | Freq: Three times a day (TID) | ORAL | 0 refills | Status: DC

## 2018-05-29 NOTE — Patient Instructions (Signed)
Complete lab work prior to leaving. Begin Keflex. You should be contacted about scheduling your [facility]. Call if increased pain/redness or fever.

## 2018-05-29 NOTE — Progress Notes (Signed)
Subjective:    Patient ID: Susan Whitaker, female    DOB: 1970/07/11, 48 y.o.   MRN: [id number]  HPI  Patient is a 48 yr old female who presents today with chief complaint of left sided mastalgia. Notes that she had some discharge from her left nipple. Had mammogram 07/30/17. Reports that it started 2 weeks ago.  Discharge was "milky" looking. Denies fever.   Review of Systems  See HPI  Past Medical History:  Diagnosis Date  . Allergy    allergic rhinitis  . Diabetes mellitus    type II  . FHx: hypertension   . Gastric ulcer   . GERD (gastroesophageal reflux disease)   . H/O constipation   . H/O hemorrhoids   . H/O varicella   . Hyperlipidemia   . Hypertension   . Hypokalemia   . Increased BMI 06/2010  . Primary hyperaldosteronism ([place]) 02/21/2012  . SVT (supraventricular tachycardia) ([place])    Noted 11/2011 admission  . Thrombocytopenia ([place])   . Thyroid fullness 08/2005  . Urinary incontinence      Social History   Socioeconomic History  . Marital status: Single    Spouse name: Not on file  . Number of children: 2  . Years of education: Not on file  . Highest education level: Not on file  Occupational History  . Occupation: YOUTH EMPLOYMENT    Employer: [employer] ([employer])  Social Needs  . Financial resource strain: Not on file  . Food insecurity:    Worry: Not on file    Inability: Not on file  . Transportation needs:    Medical: Not on file    Non-medical: Not on file  Tobacco Use  . Smoking status: Never Smoker  . Smokeless tobacco: Never Used  Substance and Sexual Activity  . Alcohol use: No  . Drug use: No  . Sexual activity: Never    Partners: Male    Birth control/protection: None  Lifestyle  . Physical activity:    Days per week: Not on file    Minutes per session: Not on file  . Stress: Not on file  Relationships  . Social connections:    Talks on phone: Not on file    Gets together: Not on file    Attends religious service: Not on file    Active  member of club or organization: Not on file    Attends meetings of clubs or organizations: Not on file    Relationship status: Not on file  . Intimate partner violence:    Fear of current or ex partner: Not on file    Emotionally abused: Not on file    Physically abused: Not on file    Forced sexual activity: Not on file  Other Topics Concern  . Not on file  Social History Narrative   Works at [employer]          Past Surgical History:  Procedure Laterality Date  . ABDOMINAL SURGERY     61 months of age-- unsure of type of surgery  . COLONOSCOPY  04/2016   hemorrhoids--normal per pt with Dr Collene Mares  . HEMORRHOID SURGERY  2005/2006  . UTERINE FIBROID EMBOLIZATION      Family History  Problem Relation Age of Onset  . Cancer Mother        breast  . Stroke Mother   . Cancer Maternal Grandmother        breast  . Hypertension Maternal Grandmother   . Cancer  Other        breast, lung  . Hypertension Other   . Stroke Other   . Vision loss Father   . Heart disease Father        Rhythm disturbance  . Diabetes Neg Hx     Allergies  Allergen Reactions  . Lisinopril Cough    Current Outpatient Medications on File Prior to Visit  Medication Sig Dispense Refill  . AMBULATORY NON FORMULARY MEDICATION BORIC ACID CAPSULE 600mg . Insert 1 capsule vaginally two times a week. (Patient taking differently: Place 600 mg vaginally See admin instructions. Boric acid capsules- 600 mg each: Insert 1 capsule (600 mg) per vagina two times a week as needed/as directed for yeast infections) 8 capsule 5  . amLODipine (NORVASC) 10 MG tablet TAKE 1 TABLET BY MOUTH ONCE DAILY (Patient taking differently: Take 10 mg by mouth daily. ) 90 tablet 0  . amLODipine (NORVASC) 10 MG tablet TAKE 1 TABLET BY MOUTH ONCE DAILY 30 tablet 5  . aspirin EC 81 MG tablet Take 1 tablet (81 mg total) by mouth daily.    . carvedilol (COREG) 25 MG tablet TAKE 1 TABLET BY MOUTH TWICE DAILY WITH A MEAL (Patient taking  differently: Take 25 mg by mouth 2 (two) times daily with a meal. ) 180 tablet 0  . Dulaglutide (TRULICITY) 5.36 IW/8.0HO SOPN Inject in the abdominal skin as directed once a week 4 pen 11  . fluconazole (DIFLUCAN) 150 MG tablet 1 tab by mouth today, may repeat in 3 days if symptoms not improved. 2 tablet 0  . glucose blood (ACCU-CHEK GUIDE) test strip 1 each by Other route 3 (three) times daily. Use as instructed to check once daily. 100 each 12  . INVOKANA 300 MG TABS tablet TAKE 1 TABLET BY MOUTH ONCE DAILY BEFORE BREAKFAST (Patient taking differently: Take 300 mg by mouth daily before breakfast. ) 90 tablet 1  . JANUVIA 100 MG tablet TAKE 1 TABLET BY MOUTH ONCE DAILY (Patient taking differently: Take 100 mg by mouth daily. ) 90 tablet 1  . losartan (COZAAR) 100 MG tablet TAKE 1 TABLET BY MOUTH ONCE DAILY 30 tablet 5  . metFORMIN (GLUCOPHAGE-XR) 500 MG 24 hr tablet Take 1 tablet (500 mg total) by mouth 2 (two) times daily. 180 tablet 1  . metoprolol tartrate (LOPRESSOR) 100 MG tablet TAKE 1 TABLET BY MOUTH TWICE DAILY 180 tablet 1  . mupirocin ointment (BACTROBAN) 2 % Sig to affected area twice a day x 7 days. 22 g 1  . nystatin (NYSTATIN) powder Apply twice daily beneath breasts. 30 g 1  . ONETOUCH DELICA LANCETS FINE MISC Use to check blood sugar 3 times daily 100 each 3  . Polyethyl Glycol-Propyl Glycol (SYSTANE PRESERVATIVE FREE OP) Place 2 drops into both eyes 4 (four) times daily.    . rosuvastatin (CRESTOR) 20 MG tablet Take 1 tablet (20 mg total) by mouth daily. 90 tablet 0  . spironolactone (ALDACTONE) 100 MG tablet TAKE 1 TABLET BY MOUTH ONCE DAILY 30 tablet 5   No current facility-administered medications on file prior to visit.     BP (!) 141/83 (BP Location: Left Arm, Patient Position: Sitting, Cuff Size: Large)   Pulse 82   Temp 98.8 F (37.1 C) (Oral)   Resp 16   Ht 5\' 2"  (1.575 m)   Wt 204 lb (92.5 kg)   LMP 05/29/2018   SpO2 99%   BMI 37.31 kg/m        Objective:  Physical Exam Constitutional:      Appearance: Normal appearance.  Neurological:     Mental Status: She is alert.   Breast:  + tenderness to palpation of left nipple.  ? Mild erythema noted surrounding areola.  No obvious nipple drainage.         Assessment & Plan:  Mastitis-  Will rx with keflex.  Refer for diagnostic mammo/US. Plan follow up in 1 week. She is very nervous about family hx of breast cancer and anxious to complete imaging. Will check cbc and prolactin level.

## 2018-05-30 LAB — CBC WITH DIFFERENTIAL/PLATELET
Basophils Absolute: 0.1 10*3/uL (ref 0.0–0.1)
Basophils Relative: 0.9 % (ref 0.0–3.0)
Eosinophils Absolute: 0.1 10*3/uL (ref 0.0–0.7)
Eosinophils Relative: 1.3 % (ref 0.0–5.0)
HCT: 38.6 % (ref 36.0–46.0)
Hemoglobin: 12.7 g/dL (ref 12.0–15.0)
Lymphocytes Relative: 45.8 % (ref 12.0–46.0)
Lymphs Abs: 3.9 10*3/uL (ref 0.7–4.0)
MCHC: 33 g/dL (ref 30.0–36.0)
MCV: 87.1 fl (ref 78.0–100.0)
Monocytes Absolute: 0.6 10*3/uL (ref 0.1–1.0)
Monocytes Relative: 7.1 % (ref 3.0–12.0)
Neutro Abs: 3.8 10*3/uL (ref 1.4–7.7)
Neutrophils Relative %: 44.9 % (ref 43.0–77.0)
Platelets: 461 10*3/uL — ABNORMAL HIGH (ref 150.0–400.0)
RBC: 4.43 Mil/uL (ref 3.87–5.11)
RDW: 14 % (ref 11.5–15.5)
WBC: 8.4 10*3/uL (ref 4.0–10.5)

## 2018-05-31 LAB — PROLACTIN: Prolactin: 17.3 ng/mL

## 2018-06-05 ENCOUNTER — Ambulatory Visit
Admission: RE | Admit: 2018-06-05 | Discharge: 2018-06-05 | Disposition: A | Discharge status: Home / Self Care | Payer: 59 | Source: Ambulatory Visit | Attending: Family | Admitting: Family

## 2018-06-05 ENCOUNTER — Other Ambulatory Visit: Payer: Self-pay | Admitting: Family

## 2018-06-05 DIAGNOSIS — N644 Mastodynia: Secondary | ICD-10-CM | POA: Diagnosis not present

## 2018-06-05 DIAGNOSIS — N6452 Nipple discharge: Secondary | ICD-10-CM

## 2018-06-05 DIAGNOSIS — R922 Inconclusive mammogram: Secondary | ICD-10-CM | POA: Diagnosis not present

## 2018-06-05 HISTORY — DX: Nipple discharge: N64.52

## 2018-06-06 ENCOUNTER — Telehealth: Payer: Self-pay | Admitting: Family

## 2018-06-06 DIAGNOSIS — N644 Mastodynia: Secondary | ICD-10-CM

## 2018-06-06 DIAGNOSIS — N6452 Nipple discharge: Secondary | ICD-10-CM

## 2018-06-06 NOTE — Telephone Encounter (Signed)
Spoke to patient. Advised her that I was placing order for breast MRI and to let me know if she has not be called to schedule by Friday of this week. Pt verbalizes understanding.

## 2018-06-12 ENCOUNTER — Other Ambulatory Visit: Payer: Self-pay

## 2018-06-14 ENCOUNTER — Ambulatory Visit: Payer: Self-pay | Admitting: Endocrinology

## 2018-06-14 ENCOUNTER — Encounter: Payer: Self-pay | Admitting: Family

## 2018-06-14 NOTE — Telephone Encounter (Signed)
Received response from Imaging to make sure pre-cert was obtained then have pt call [phone] to schedule as this will need to be done at  [place] ([address]). Awaiting status of pre-cert then will contact pt with all information.

## 2018-06-14 NOTE — Telephone Encounter (Signed)
Sent message to imaging to check status.

## 2018-06-19 ENCOUNTER — Other Ambulatory Visit: Payer: Self-pay | Admitting: Family

## 2018-06-22 NOTE — Progress Notes (Signed)
Patient ID: Susan Whitaker, female   DOB: 1970-09-15, 48 y.o.   MRN: [id number]           Reason for Appointment: Follow-up for Type 2 Diabetes  Referring physician:  Debbrah Alar   History of Present Illness:          Date of diagnosis of type 2 diabetes mellitus: 2013        Background history:  She was diagnosed to have diabetes when she was having fibroid surgery. A1c in 2013 was 6.9 She was treated with metformin but she says she was not able to take this because of diarrhea and would be irregular with it However her blood sugars stayed about the same until 2015 when they were higher and glipizide added Her blood sugars had been much higher since about late 2015 with A1c in the 9-10 range with her taking glipizide alone  Recent history:   Non-insulin hypoglycemic drugs the patient is taking are: Invokana 237 mg daily, Trulicity 6.28 mg, metformin ER 500 mg 1x daily   Her A1c had gone up to 8.1 in November 19 and is now 5.9  Previous range 6.7-7.6  Current management, blood sugar patterns and problems identified:  She did not follow-up after starting Trulicity in November  However fructosamine had already improved to the normal range at that time  She said that she has been able to cut back on her portions and snacks  She has lost about 12 pounds  She is very regular with trying to walk at least 30 minutes 5 days a week  Again because of needle phobia she does not check her sugars but they are looking good in the lab including today in the office glucose is 126 even after eating oatmeal and applesauce  Subjectively feeling better also  No side effects like nausea with the Trulicity and still taking 0.75  Side effects from medications have been: Diarrhea from regular metformin  Compliance with the medical regimen: Excellent  Glucose monitoring:   Less than once a day recently , previously done up to 2 times a day         Glucometer: Unknown       Blood  Glucose readings not available, she has not checked into the coverage for freestyle libre   Self-care: The diet that the patient has been following is: tries to limit Portions .     Typical meal intake: Breakfast is  fruit, Activia Yogurt,  For snacks she will have Snack box, chicken breast at dinnertime.  No Sweet drinks                 Dietician visit, most recent: 5/17, has  had diabetes education classes               Exercise: Walking 5/7, 30 min  Weight history:  Wt Readings from Last 3 Encounters:  06/23/18 206 lb (93.4 kg)  05/29/18 204 lb (92.5 kg)  03/27/18 218 lb (98.9 kg)    Glycemic control:   Lab Results  Component Value Date   HGBA1C 8.1 (H) 03/24/2018   HGBA1C 7.4 (H) 11/07/2017   HGBA1C 7.6 (H) 04/29/2017   Lab Results  Component Value Date   MICROALBUR 1.5 11/07/2017   LDLCALC 85 07/25/2017   CREATININE 1.16 05/03/2018   Lab Results  Component Value Date   MICRALBCREAT 2.2 11/07/2017   Office Visit on 06/23/2018  Component Date Value Ref Range Status  . POC Glucose 06/23/2018  126* 70 - 99 mg/dl Final     Other active problems are in review of systems    Allergies as of 06/23/2018      Reactions   Lisinopril Cough      Medication List       Accurate as of June 23, 2018 10:52 AM. Always use your most recent med list.        AMBULATORY NON FORMULARY MEDICATION BORIC ACID CAPSULE 600mg . Insert 1 capsule vaginally two times a week.   amLODipine 10 MG tablet Commonly known as:  NORVASC TAKE 1 TABLET BY MOUTH ONCE DAILY   aspirin EC 81 MG tablet Take 1 tablet (81 mg total) by mouth daily.   carvedilol 25 MG tablet Commonly known as:  COREG TAKE 1 TABLET BY MOUTH TWICE DAILY WITH A MEAL   Dulaglutide 0.75 MG/0.5ML Sopn Commonly known as:  TRULICITY Inject in the abdominal skin as directed once a week   glucose blood test strip Commonly known as:  ACCU-CHEK GUIDE 1 each by Other route 3 (three) times daily. Use as instructed to  check once daily.   INVOKANA 300 MG Tabs tablet Generic drug:  canagliflozin TAKE 1 TABLET BY MOUTH ONCE DAILY BEFORE BREAKFAST   JANUVIA 100 MG tablet Generic drug:  sitaGLIPtin TAKE 1 TABLET BY MOUTH ONCE DAILY   losartan 100 MG tablet Commonly known as:  COZAAR TAKE 1 TABLET BY MOUTH ONCE DAILY   metFORMIN 500 MG 24 hr tablet Commonly known as:  GLUCOPHAGE-XR Take 1 tablet (500 mg total) by mouth 2 (two) times daily.   metoprolol tartrate 100 MG tablet Commonly known as:  LOPRESSOR TAKE 1 TABLET BY MOUTH TWICE DAILY   ONETOUCH DELICA LANCETS FINE Misc Use to check blood sugar 3 times daily   rosuvastatin 20 MG tablet Commonly known as:  CRESTOR Take 1 tablet (20 mg total) by mouth daily.   spironolactone 100 MG tablet Commonly known as:  ALDACTONE TAKE 1 TABLET BY MOUTH ONCE DAILY   SYSTANE PRESERVATIVE FREE OP Place 2 drops into both eyes 4 (four) times daily.       Allergies:  Allergies  Allergen Reactions  . Lisinopril Cough    Past Medical History:  Diagnosis Date  . Allergy    allergic rhinitis  . Breast discharge    left milky spontaneous x 1 month with pain  . Diabetes mellitus    type II  . FHx: hypertension   . Gastric ulcer   . GERD (gastroesophageal reflux disease)   . H/O constipation   . H/O hemorrhoids   . H/O varicella   . Hyperlipidemia   . Hypertension   . Hypokalemia   . Increased BMI 06/2010  . Primary hyperaldosteronism ([place]) 02/21/2012  . SVT (supraventricular tachycardia) ([place])    Noted 11/2011 admission  . Thrombocytopenia ([place])   . Thyroid fullness 08/2005  . Urinary incontinence     Past Surgical History:  Procedure Laterality Date  . ABDOMINAL SURGERY     27 months of age-- unsure of type of surgery  . COLONOSCOPY  04/2016   hemorrhoids--normal per pt with Dr Collene Mares  . HEMORRHOID SURGERY  2005/2006  . UTERINE FIBROID EMBOLIZATION      Family History  Problem Relation Age of Onset  . Cancer Mother        breast    . Stroke Mother   . Cancer Maternal Grandmother        breast  . Hypertension Maternal Grandmother   .  Cancer Other        breast, lung  . Hypertension Other   . Stroke Other   . Vision loss Father   . Heart disease Father        Rhythm disturbance  . Diabetes Neg Hx     Social History:  reports that she has never smoked. She has never used smokeless tobacco. She reports that she does not drink alcohol or use drugs.    Review of Systems    Lipid history: LDL 171 At baseline Taking Lipitor 20 mg for this    Lab Results  Component Value Date   CHOL 139 07/25/2017   HDL 42.00 07/25/2017   LDLCALC 85 07/25/2017   LDLDIRECT 103.1 03/04/2014   TRIG 64.0 07/25/2017   CHOLHDL 3 07/25/2017           Hypertension:   She is on amlodipine, metoprolol and losartan Because of hypokalemia she was also given 100 mg of Aldactone  No further hypokalemia     BP Readings from Last 3 Encounters:  06/23/18 130/72  05/29/18 (!) 141/83  03/27/18 120/68    Lab Results  Component Value Date   CREATININE 1.16 05/03/2018   BUN 22 05/03/2018   NA 136 05/03/2018   K 4.1 05/03/2018   CL 102 05/03/2018   CO2 26 05/03/2018     History of goiter with multiple nodules, stable as of 2015, largest 1.4 cm nodule in the right side   Most recent foot exam: 7/18   She is due to have possible surgery for recurrent SVT   Review of Systems    Office Visit on 06/23/2018  Component Date Value Ref Range Status  . POC Glucose 06/23/2018 126* 70 - 99 mg/dl Final    Physical Examination:  BP 130/72 (BP Location: Left Arm, Patient Position: Sitting, Cuff Size: Normal)   Pulse 82   Ht 5\' 2"  (1.575 m)   Wt 206 lb (93.4 kg)   LMP 05/29/2018   SpO2 95%   BMI 37.68 kg/m       ASSESSMENT/PLAN:  Diabetes type 2, obese  See history of present illness for detailed discussion of current diabetes management, blood sugar patterns and problems identified  Her A1c is the best in a long  time at 5.9, previously had gone up to 8.1  She is on a 3 regimen with Trulicity 5.00, Invokana 300 mg and low-dose metformin ER  She has done really well since her last visit with adding Trulicity instead of Januvia She has started exercising and also watching her portions and eating healthier With weight loss also her blood sugar control is improving  She will continue the same regimen Also she will look into the coverage for freestyle libre sensor  Follow-up in 12 weeks  HYPERTENSION:  This is controlled and hypokalemia is well controlled with 100 mg of Aldactone   Lipids: Will need follow-up on the next visit  Patient Instructions  Check blood sugars on waking up 1 days a week  Also check blood sugars about 2 hours after meals and do this after different meals by rotation  Recommended blood sugar levels on waking up are 90-130 and about 2 hours after meal is 130-160  Please bring your blood sugar monitor to each visit, thank you     Elayne Snare 06/23/2018, 10:52 AM   Note: This office note was prepared with Dragon voice recognition system technology. Any transcriptional errors that result from this process are unintentional.

## 2018-06-23 ENCOUNTER — Encounter: Payer: Self-pay | Admitting: Endocrinology

## 2018-06-23 ENCOUNTER — Ambulatory Visit (INDEPENDENT_AMBULATORY_CARE_PROVIDER_SITE_OTHER): Payer: 59 | Admitting: Endocrinology

## 2018-06-23 VITALS — BP 130/72 | HR 82 | Ht 62.0 in | Wt 206.0 lb

## 2018-06-23 DIAGNOSIS — E669 Obesity, unspecified: Secondary | ICD-10-CM | POA: Diagnosis not present

## 2018-06-23 DIAGNOSIS — E1169 Type 2 diabetes mellitus with other specified complication: Secondary | ICD-10-CM | POA: Diagnosis not present

## 2018-06-23 LAB — GLUCOSE, POCT (MANUAL RESULT ENTRY): POC Glucose: 126 mg/dl — AB (ref 70–99)

## 2018-06-23 LAB — POCT GLYCOSYLATED HEMOGLOBIN (HGB A1C): Hemoglobin A1C: 5.9 % — AB (ref 4.0–5.6)

## 2018-06-23 NOTE — Patient Instructions (Addendum)
Check blood sugars on waking up 1 days a week  Also check blood sugars about 2 hours after meals and do this after different meals by rotation  Recommended blood sugar levels on waking up are 90-130 and about 2 hours after meal is 130-160  Please bring your blood sugar monitor to each visit, thank you

## 2018-06-25 ENCOUNTER — Ambulatory Visit
Admission: RE | Admit: 2018-06-25 | Discharge: 2018-06-25 | Disposition: A | Discharge status: Home / Self Care | Payer: 59 | Source: Ambulatory Visit | Attending: Family | Admitting: Family

## 2018-06-25 DIAGNOSIS — N644 Mastodynia: Secondary | ICD-10-CM

## 2018-06-25 DIAGNOSIS — N6452 Nipple discharge: Secondary | ICD-10-CM

## 2018-06-25 MED ORDER — GADOBUTROL 1 MMOL/ML IV SOLN
10.0000 mL | Freq: Once | INTRAVENOUS | Status: AC | PRN
  Administered 2018-06-25: 10 mL via INTRAVENOUS

## 2018-06-26 ENCOUNTER — Encounter: Payer: Self-pay | Admitting: Family

## 2018-07-02 ENCOUNTER — Other Ambulatory Visit: Payer: Self-pay | Admitting: Endocrinology

## 2018-07-02 ENCOUNTER — Other Ambulatory Visit: Payer: Self-pay | Admitting: Family

## 2018-07-25 ENCOUNTER — Ambulatory Visit: Payer: 59 | Admitting: Family

## 2018-07-25 ENCOUNTER — Encounter: Payer: Self-pay | Admitting: Family

## 2018-07-25 ENCOUNTER — Ambulatory Visit (HOSPITAL_BASED_OUTPATIENT_CLINIC_OR_DEPARTMENT_OTHER)
Admission: RE | Admit: 2018-07-25 | Discharge: 2018-07-25 | Disposition: A | Discharge status: Home / Self Care | Payer: 59 | Source: Ambulatory Visit | Attending: Family | Admitting: Family

## 2018-07-25 VITALS — BP 123/75 | HR 90 | Temp 99.9°F | Resp 16 | Wt 200.0 lb

## 2018-07-25 DIAGNOSIS — R112 Nausea with vomiting, unspecified: Secondary | ICD-10-CM | POA: Diagnosis not present

## 2018-07-25 DIAGNOSIS — R05 Cough: Secondary | ICD-10-CM | POA: Insufficient documentation

## 2018-07-25 DIAGNOSIS — R059 Cough, unspecified: Secondary | ICD-10-CM

## 2018-07-25 DIAGNOSIS — R0602 Shortness of breath: Secondary | ICD-10-CM | POA: Diagnosis not present

## 2018-07-25 DIAGNOSIS — R69 Illness, unspecified: Secondary | ICD-10-CM

## 2018-07-25 DIAGNOSIS — R110 Nausea: Secondary | ICD-10-CM

## 2018-07-25 DIAGNOSIS — J111 Influenza due to unidentified influenza virus with other respiratory manifestations: Secondary | ICD-10-CM

## 2018-07-25 MED ORDER — ONDANSETRON HCL 4 MG/2ML IJ SOLN
4.0000 mg | Freq: Once | INTRAMUSCULAR | Status: AC
  Administered 2018-07-25: 4 mg via INTRAMUSCULAR

## 2018-07-25 MED ORDER — GUAIFENESIN-CODEINE 100-10 MG/5ML PO SYRP: 5.0000 mL | ORAL_SOLUTION | Freq: Three times a day (TID) | ORAL | 0 refills | Status: AC | PRN

## 2018-07-25 MED ORDER — ONDANSETRON HCL 4 MG PO TABS: 4.0000 mg | ORAL_TABLET | Freq: Three times a day (TID) | ORAL | 0 refills | Status: DC | PRN

## 2018-07-25 NOTE — Progress Notes (Signed)
64,ml,,

## 2018-07-25 NOTE — Progress Notes (Signed)
Subjective:    Patient ID: Susan Whitaker, female    DOB: [birth year], 48 y.o.   MRN: [id number]  HPI   Patient is a 48 yr old female who presents today for follow up.  1) Cough- reports cough began 3 days ago. Reports associated vomiting, body aches/chills since this AM.  Reports that she took tylenol 4 hours ago. Has not taken her temp at home.  Reports + sore throat and HA.  Has been drinking green tea.  Reports that she has not been able to keep much down the last 24 hours.      Review of Systems See HPI  Past Medical History:  Diagnosis Date  . Allergy    allergic rhinitis  . Breast discharge    left milky spontaneous x 1 month with pain  . Diabetes mellitus    type II  . FHx: hypertension   . Gastric ulcer   . GERD (gastroesophageal reflux disease)   . H/O constipation   . H/O hemorrhoids   . H/O varicella   . Hyperlipidemia   . Hypertension   . Hypokalemia   . Increased BMI 06/2010  . Primary hyperaldosteronism ([place]) 02/21/2012  . SVT (supraventricular tachycardia) ([place])    Noted 11/2011 admission  . Thrombocytopenia ([place])   . Thyroid fullness 08/2005  . Urinary incontinence      Social History   Socioeconomic History  . Marital status: Single    Spouse name: Not on file  . Number of children: 2  . Years of education: Not on file  . Highest education level: Not on file  Occupational History  . Occupation: YOUTH EMPLOYMENT    Employer: [employer] ([employer])  Social Needs  . Financial resource strain: Not on file  . Food insecurity:    Worry: Not on file    Inability: Not on file  . Transportation needs:    Medical: Not on file    Non-medical: Not on file  Tobacco Use  . Smoking status: Never Smoker  . Smokeless tobacco: Never Used  Substance and Sexual Activity  . Alcohol use: No  . Drug use: No  . Sexual activity: Never    Partners: Male    Birth control/protection: None  Lifestyle  . Physical activity:    Days per week: Not on file    Minutes per  session: Not on file  . Stress: Not on file  Relationships  . Social connections:    Talks on phone: Not on file    Gets together: Not on file    Attends religious service: Not on file    Active member of club or organization: Not on file    Attends meetings of clubs or organizations: Not on file    Relationship status: Not on file  . Intimate partner violence:    Fear of current or ex partner: Not on file    Emotionally abused: Not on file    Physically abused: Not on file    Forced sexual activity: Not on file  Other Topics Concern  . Not on file  Social History Narrative   Works at [employer]          Past Surgical History:  Procedure Laterality Date  . ABDOMINAL SURGERY     94 months of age-- unsure of type of surgery  . COLONOSCOPY  04/2016   hemorrhoids--normal per pt with Dr Collene Mares  . HEMORRHOID SURGERY  2005/2006  . UTERINE FIBROID EMBOLIZATION  Family History  Problem Relation Age of Onset  . Cancer Mother        breast  . Stroke Mother   . Cancer Maternal Grandmother        breast  . Hypertension Maternal Grandmother   . Cancer Other        breast, lung  . Hypertension Other   . Stroke Other   . Vision loss Father   . Heart disease Father        Rhythm disturbance  . Diabetes Neg Hx     Allergies  Allergen Reactions  . Lisinopril Cough    Current Outpatient Medications on File Prior to Visit  Medication Sig Dispense Refill  . AMBULATORY NON FORMULARY MEDICATION BORIC ACID CAPSULE 600mg . Insert 1 capsule vaginally two times a week. (Patient taking differently: Place 600 mg vaginally See admin instructions. Boric acid capsules- 600 mg each: Insert 1 capsule (600 mg) per vagina two times a week as needed/as directed for yeast infections) 8 capsule 5  . amLODipine (NORVASC) 10 MG tablet TAKE 1 TABLET BY MOUTH ONCE DAILY 30 tablet 5  . aspirin EC 81 MG tablet Take 1 tablet (81 mg total) by mouth daily.    Marland Kitchen atorvastatin (LIPITOR) 20 MG  tablet TAKE 1 TABLET BY MOUTH ONCE DAILY 90 tablet 1  . canagliflozin (INVOKANA) 300 MG TABS tablet Take 1 tablet (300 mg total) by mouth daily before breakfast. 90 tablet 0  . carvedilol (COREG) 25 MG tablet TAKE 1 TABLET BY MOUTH TWICE DAILY WITH A MEAL (Patient taking differently: Take 25 mg by mouth 2 (two) times daily with a meal. ) 180 tablet 0  . Dulaglutide (TRULICITY) 1.63 WG/6.6ZL SOPN Inject in the abdominal skin as directed once a week 4 pen 11  . glucose blood (ACCU-CHEK GUIDE) test strip 1 each by Other route 3 (three) times daily. Use as instructed to check once daily. 100 each 12  . JANUVIA 100 MG tablet TAKE 1 TABLET BY MOUTH ONCE DAILY 90 tablet 0  . losartan (COZAAR) 100 MG tablet TAKE 1 TABLET BY MOUTH ONCE DAILY 30 tablet 5  . metFORMIN (GLUCOPHAGE-XR) 500 MG 24 hr tablet Take 1 tablet (500 mg total) by mouth 2 (two) times daily. (Patient taking differently: Take 500 mg by mouth daily. ) 180 tablet 1  . metoprolol tartrate (LOPRESSOR) 100 MG tablet TAKE 1 TABLET BY MOUTH TWICE DAILY 180 tablet 1  . ONETOUCH DELICA LANCETS FINE MISC Use to check blood sugar 3 times daily 100 each 3  . Polyethyl Glycol-Propyl Glycol (SYSTANE PRESERVATIVE FREE OP) Place 2 drops into both eyes 4 (four) times daily.    . rosuvastatin (CRESTOR) 20 MG tablet Take 1 tablet (20 mg total) by mouth daily. 90 tablet 0  . spironolactone (ALDACTONE) 100 MG tablet TAKE 1 TABLET BY MOUTH ONCE DAILY 30 tablet 5   No current facility-administered medications on file prior to visit.     BP 123/75 (BP Location: Right Arm, Patient Position: Sitting, Cuff Size: Large)   Pulse 90   Temp 99.9 F (37.7 C) (Oral)   Resp 16   Wt 200 lb (90.7 kg)   SpO2 100%   BMI 36.58 kg/m       Objective:   Physical Exam Constitutional:      Appearance: She is well-developed.  HENT:     Right Ear: Tympanic membrane and ear canal normal.     Left Ear: Tympanic membrane and ear canal normal.  Mouth/Throat:      Mouth: Mucous membranes are moist.  Cardiovascular:     Rate and Rhythm: Normal rate and regular rhythm.     Heart sounds: Normal heart sounds. No murmur.  Pulmonary:     Effort: Pulmonary effort is normal. No respiratory distress.     Breath sounds: Normal breath sounds. No wheezing.  Lymphadenopathy:     Cervical: No cervical adenopathy.  Psychiatric:        Behavior: Behavior normal.        Thought Content: Thought content normal.        Judgment: Judgment normal.           Assessment & Plan:  Flu-like illness- Rapid flu is negative. Pt given IM zofran today to help with nausea as well as a PO for home. She is advised as follows:  Please complete x-ray on the first floor. You may use cheratussin as needed for cough- do not drive after taking. Call if new/worsening symptoms or if not improved in 3-4 days.

## 2018-07-25 NOTE — Patient Instructions (Signed)
Please complete x-ray on the first floor. You may use cheratussin as needed for cough- do not drive after taking. Call if new/worsening symptoms or if not improved in 3-4 days.

## 2018-07-27 ENCOUNTER — Emergency Department (HOSPITAL_BASED_OUTPATIENT_CLINIC_OR_DEPARTMENT_OTHER): Payer: 59

## 2018-07-27 ENCOUNTER — Other Ambulatory Visit: Payer: Self-pay

## 2018-07-27 ENCOUNTER — Encounter (HOSPITAL_BASED_OUTPATIENT_CLINIC_OR_DEPARTMENT_OTHER): Payer: Self-pay

## 2018-07-27 ENCOUNTER — Emergency Department (HOSPITAL_BASED_OUTPATIENT_CLINIC_OR_DEPARTMENT_OTHER)
Admission: EM | Admit: 2018-07-27 | Discharge: 2018-07-28 | Disposition: A | Discharge status: Home / Self Care | Payer: 59 | Attending: Emergency Medicine | Admitting: Emergency Medicine

## 2018-07-27 DIAGNOSIS — E119 Type 2 diabetes mellitus without complications: Secondary | ICD-10-CM | POA: Diagnosis not present

## 2018-07-27 DIAGNOSIS — J111 Influenza due to unidentified influenza virus with other respiratory manifestations: Secondary | ICD-10-CM | POA: Insufficient documentation

## 2018-07-27 DIAGNOSIS — R112 Nausea with vomiting, unspecified: Secondary | ICD-10-CM | POA: Insufficient documentation

## 2018-07-27 DIAGNOSIS — Z79899 Other long term (current) drug therapy: Secondary | ICD-10-CM | POA: Diagnosis not present

## 2018-07-27 DIAGNOSIS — Z7984 Long term (current) use of oral hypoglycemic drugs: Secondary | ICD-10-CM | POA: Insufficient documentation

## 2018-07-27 DIAGNOSIS — I10 Essential (primary) hypertension: Secondary | ICD-10-CM | POA: Insufficient documentation

## 2018-07-27 DIAGNOSIS — Z7982 Long term (current) use of aspirin: Secondary | ICD-10-CM | POA: Diagnosis not present

## 2018-07-27 DIAGNOSIS — R197 Diarrhea, unspecified: Secondary | ICD-10-CM | POA: Diagnosis not present

## 2018-07-27 DIAGNOSIS — R1110 Vomiting, unspecified: Secondary | ICD-10-CM | POA: Diagnosis not present

## 2018-07-27 LAB — CBC WITH DIFFERENTIAL/PLATELET
Abs Immature Granulocytes: 0.02 10*3/uL (ref 0.00–0.07)
Basophils Absolute: 0 10*3/uL (ref 0.0–0.1)
Basophils Relative: 0 %
Eosinophils Absolute: 0.1 10*3/uL (ref 0.0–0.5)
Eosinophils Relative: 1 %
HCT: 40.9 % (ref 36.0–46.0)
Hemoglobin: 13.1 g/dL (ref 12.0–15.0)
Immature Granulocytes: 0 %
Lymphocytes Relative: 57 %
Lymphs Abs: 3.4 10*3/uL (ref 0.7–4.0)
MCH: 28.8 pg (ref 26.0–34.0)
MCHC: 32 g/dL (ref 30.0–36.0)
MCV: 89.9 fL (ref 80.0–100.0)
Monocytes Absolute: 0.5 10*3/uL (ref 0.1–1.0)
Monocytes Relative: 8 %
Neutro Abs: 2 10*3/uL (ref 1.7–7.7)
Neutrophils Relative %: 34 %
Platelets: 422 10*3/uL — ABNORMAL HIGH (ref 150–400)
RBC: 4.55 MIL/uL (ref 3.87–5.11)
RDW: 13.8 % (ref 11.5–15.5)
WBC: 6 10*3/uL (ref 4.0–10.5)
nRBC: 0 % (ref 0.0–0.2)

## 2018-07-27 LAB — URINALYSIS, ROUTINE W REFLEX MICROSCOPIC
Bilirubin Urine: NEGATIVE
Glucose, UA: >=500 mg/dL — AB
Ketones, ur: NEGATIVE mg/dL
Leukocytes,Ua: NEGATIVE
Nitrite: NEGATIVE
Protein, ur: NEGATIVE mg/dL
Specific Gravity, Urine: 1.02 (ref 1.005–1.030)
pH: 7 (ref 5.0–8.0)

## 2018-07-27 LAB — URINALYSIS, MICROSCOPIC (REFLEX)

## 2018-07-27 LAB — COMPREHENSIVE METABOLIC PANEL
ALT: 14 U/L (ref 0–44)
AST: 15 U/L (ref 15–41)
Albumin: 3.8 g/dL (ref 3.5–5.0)
Alkaline Phosphatase: 41 U/L (ref 38–126)
Anion gap: 7 (ref 5–15)
BUN: 14 mg/dL (ref 6–20)
CO2: 26 mmol/L (ref 22–32)
Calcium: 8.5 mg/dL — ABNORMAL LOW (ref 8.9–10.3)
Chloride: 103 mmol/L (ref 98–111)
Creatinine, Ser: 0.79 mg/dL (ref 0.44–1.00)
GFR calc Af Amer: >60 mL/min (ref >60)
GFR calc non Af Amer: >60 mL/min (ref >60)
Glucose, Bld: 130 mg/dL — ABNORMAL HIGH (ref 70–99)
Potassium: 3.2 mmol/L — ABNORMAL LOW (ref 3.5–5.1)
Sodium: 136 mmol/L (ref 135–145)
Total Bilirubin: 0.4 mg/dL (ref 0.3–1.2)
Total Protein: 7.8 g/dL (ref 6.5–8.1)

## 2018-07-27 LAB — LIPASE, BLOOD: Lipase: 23 U/L (ref 11–51)

## 2018-07-27 MED ORDER — IOHEXOL 300 MG/ML  SOLN
100.0000 mL | Freq: Once | INTRAMUSCULAR | Status: AC | PRN
  Administered 2018-07-27: 100 mL via INTRAVENOUS

## 2018-07-27 MED ORDER — ONDANSETRON HCL 4 MG/2ML IJ SOLN
4.0000 mg | Freq: Once | INTRAMUSCULAR | Status: AC
  Administered 2018-07-27: 4 mg via INTRAVENOUS
  Filled 2018-07-27: qty 2

## 2018-07-27 MED ORDER — SODIUM CHLORIDE 0.9 % IV BOLUS
1000.0000 mL | Freq: Once | INTRAVENOUS | Status: AC
  Administered 2018-07-27: 1000 mL via INTRAVENOUS

## 2018-07-27 NOTE — ED Triage Notes (Signed)
Pt c/o n/v/d x 4 days-seen by PCP 2 days ago-had a CXR-pt was dx with flu and rx meds for vomiting-NAD-steady gait

## 2018-07-27 NOTE — ED Provider Notes (Signed)
[facility] Provider Note   CSN: [id number] Arrival date & time: 07/27/18  2028  History   Chief Complaint Chief Complaint  Patient presents with   Emesis   Influenza    +flu at PCP 07/25/18    HPI Susan Whitaker is a 48 y.o. female with past medical history significant for diabetes, influenza positive on 07/25/2018 who presents for evaluation of nausea vomiting, diarrhea.  Patient states she had similar symptoms when she was seen at PCP office.  Was given a shot of Zofran at that time which lasted for 24 hours, however her emesis and diarrhea returned.  Patient states she has not taken her p.o. Zofran prescription at home.  Emesis nonbloody, nonbilious.  Diarrhea is nonbloody.  States she has had some mild generalized abdominal pain.  Also continue to have a cough.  She did have chest x-ray 2 days ago which did not show any evidence of pneumonia at that time.  Denies fever, chills, chest pain, shortness of breath, dysuria.  Has not taken anything for symptoms PTA.  Denies additional aggravating or alleviating factors.  Does have prior abdominal surgeries.  Patient born with gastroschisis. Patient not prescribed the flu as she was outside treatment window.  History obtained from patient.  No interpreter was used.     HPI  Past Medical History:  Diagnosis Date   Allergy    allergic rhinitis   Breast discharge    left milky spontaneous x 1 month with pain   Diabetes mellitus    type II   FHx: hypertension    Gastric ulcer    GERD (gastroesophageal reflux disease)    H/O constipation    H/O hemorrhoids    H/O varicella    Hyperlipidemia    Hypertension    Hypokalemia    Increased BMI 06/2010   Primary hyperaldosteronism ([place]) 02/21/2012   SVT (supraventricular tachycardia) ([place])    Noted 11/2011 admission   Thrombocytopenia ([place])    Thyroid fullness 08/2005   Urinary incontinence     Patient Active Problem List   Diagnosis  Date Noted   Dog scratch 07/06/2017   Cellulitis 07/06/2017   Bacterial vaginosis 07/15/2016   Blurred vision, bilateral 06/11/2014   Non-suppurative otitis media 03/28/2014   Late menses 03/28/2014   Fatigue 04/13/2013   Right hip pain 10/31/2012   Anxiety state 07/14/2012   Routine general medical examination at a health care facility 07/14/2012   Primary hyperaldosteronism ([place]) 02/21/2012   Microalbuminuria 02/15/2012   Multiple thyroid nodules 01/04/2012   SVT (supraventricular tachycardia) ([place]) 12/29/2011   Hypokalemia 11/28/2011   Cervical pain (neck) 05/28/2011   EXTERNAL HEMORRHOIDS WITHOUT MENTION COMP 02/07/2009   Diabetes type 2, controlled ([place]) 02/07/2009   ALLERGIC RHINITIS 05/24/2008   Hyperlipemia 04/22/2008   THROMBOCYTOSIS 04/22/2008   URINARY INCONTINENCE 04/22/2008   Essential hypertension 01/04/2007   GERD 01/04/2007    Past Surgical History:  Procedure Laterality Date   ABDOMINAL SURGERY     22 months of age-- unsure of type of surgery   COLONOSCOPY  04/2016   hemorrhoids--normal per pt with Dr Collene Mares   HEMORRHOID SURGERY  2005/2006   UTERINE FIBROID EMBOLIZATION       OB History    Gravida  4   Para  2   Term  2   Preterm      AB  1   Living  2     SAB  1   TAB  Ectopic      Multiple      Live Births               Home Medications    Prior to Admission medications   Medication Sig Start Date End Date Taking? Authorizing Provider  AMBULATORY NON FORMULARY MEDICATION BORIC ACID CAPSULE 600mg . Insert 1 capsule vaginally two times a week. Patient taking differently: Place 600 mg vaginally See admin instructions. Boric acid capsules- 600 mg each: Insert 1 capsule (600 mg) per vagina two times a week as needed/as directed for yeast infections 07/13/16   Debbrah Alar, NP  amLODipine (NORVASC) 10 MG tablet TAKE 1 TABLET BY MOUTH ONCE DAILY 04/22/18   Debbrah Alar, NP  aspirin EC 81 MG  tablet Take 1 tablet (81 mg total) by mouth daily. 08/11/15   Debbrah Alar, NP  atorvastatin (LIPITOR) 20 MG tablet TAKE 1 TABLET BY MOUTH ONCE DAILY 07/03/18   Debbrah Alar, NP  canagliflozin (INVOKANA) 300 MG TABS tablet Take 1 tablet (300 mg total) by mouth daily before breakfast. 07/03/18   Elayne Snare, MD  carvedilol (COREG) 25 MG tablet TAKE 1 TABLET BY MOUTH TWICE DAILY WITH A MEAL Patient taking differently: Take 25 mg by mouth 2 (two) times daily with a meal.  11/02/17   Debbrah Alar, NP  Dulaglutide (TRULICITY) 0.30 SP/2.3RA SOPN Inject in the abdominal skin as directed once a week 05/23/18   Elayne Snare, MD  glucose blood (ACCU-CHEK GUIDE) test strip 1 each by Other route 3 (three) times daily. Use as instructed to check once daily. 11/10/17   Elayne Snare, MD  guaiFENesin-codeine [facility]) 100-10 MG/5ML syrup Take 5 mLs by mouth 3 (three) times daily as needed for up to 5 days for cough. 07/25/18 07/30/18  Debbrah Alar, NP  JANUVIA 100 MG tablet TAKE 1 TABLET BY MOUTH ONCE DAILY 06/20/18   Debbrah Alar, NP  losartan (COZAAR) 100 MG tablet TAKE 1 TABLET BY MOUTH ONCE DAILY 04/22/18   Debbrah Alar, NP  metFORMIN (GLUCOPHAGE-XR) 500 MG 24 hr tablet Take 1 tablet (500 mg total) by mouth 2 (two) times daily. Patient taking differently: Take 500 mg by mouth daily.  11/03/17   Elayne Snare, MD  metoprolol tartrate (LOPRESSOR) 100 MG tablet TAKE 1 TABLET BY MOUTH TWICE DAILY 05/18/18   Debbrah Alar, NP  ondansetron (ZOFRAN) 4 MG tablet Take 1 tablet (4 mg total) by mouth every 8 (eight) hours as needed for nausea or vomiting. 07/25/18   Debbrah Alar, NP  [facility] DELICA LANCETS FINE MISC Use to check blood sugar 3 times daily 09/28/16   Elayne Snare, MD  Polyethyl Glycol-Propyl Glycol (SYSTANE PRESERVATIVE FREE OP) Place 2 drops into both eyes 4 (four) times daily.    [provider]  rosuvastatin (CRESTOR) 20 MG tablet Take 1 tablet (20 mg  total) by mouth daily. 03/28/18   Debbrah Alar, NP  spironolactone (ALDACTONE) 100 MG tablet TAKE 1 TABLET BY MOUTH ONCE DAILY 04/22/18   Debbrah Alar, NP    Family History Family History  Problem Relation Age of Onset   Cancer Mother        breast   Stroke Mother    Cancer Maternal Grandmother        breast   Hypertension Maternal Grandmother    Cancer Other        breast, lung   Hypertension Other    Stroke Other    Vision loss Father    Heart disease Father  Rhythm disturbance   Diabetes Neg Hx     Social History Social History   Tobacco Use   Smoking status: Never Smoker   Smokeless tobacco: Never Used  Substance Use Topics   Alcohol use: No   Drug use: No     Allergies   Lisinopril   Review of Systems Review of Systems  Constitutional: Negative.   HENT: Negative.   Eyes: Negative.   Respiratory: Positive for cough. Negative for apnea, choking, chest tightness, shortness of breath, wheezing and stridor.   Cardiovascular: Negative.   Gastrointestinal: Positive for diarrhea, nausea and vomiting. Negative for anal bleeding, blood in stool, constipation and rectal pain.  Genitourinary: Negative.   Musculoskeletal: Negative.   Skin: Negative.   Neurological: Negative.   All other systems reviewed and are negative.    Physical Exam Updated Vital Signs BP 139/74 (BP Location: Right Arm)    Pulse 77    Temp 98.5 F (36.9 C) (Oral)    Resp 18    LMP 07/25/2018    SpO2 100%   Physical Exam Vitals signs and nursing note reviewed.  Constitutional:      General: She is not in acute distress.    Appearance: She is well-developed. She is not ill-appearing, toxic-appearing or diaphoretic.  HENT:     Head: Normocephalic and atraumatic.     Nose: Nose normal.     Mouth/Throat:     Comments: Mucous membranes moist.  Posterior oropharynx clear.  No evidence of tonsillar edema or exudate.  No drooling, dysphasia or trismus. Eyes:       Pupils: Pupils are equal, round, and reactive to light.  Neck:     Musculoskeletal: Normal range of motion.     Comments: No neck stiffness or neck rigidity.  No meningismus. Cardiovascular:     Rate and Rhythm: Normal rate.     Pulses: Normal pulses.     Heart sounds: Normal heart sounds.  Pulmonary:     Effort: No respiratory distress.     Comments: Clear to auscultation bilateral without wheeze, rhonchi or rales.  No accessory muscle usage.  Able speak in full sentences without difficulty. Abdominal:     General: There is no distension.     Comments: Soft without rebound or guarding.  Generalized abdominal tenderness.  No CVA tenderness.  Musculoskeletal: Normal range of motion.     Comments: Moves all 4 extremities without difficulty.  Ambulatory in department without difficulty.  Skin:    General: Skin is warm and dry.     Comments: No rashes or lesions.  Brisk capillary refill.  Neurological:     Mental Status: She is alert.     Comments: No Facial droop.  Cranial nerves II through XII grossly intact. Normal cognitive function.      ED Treatments / Results  Labs (all labs ordered are listed, but only abnormal results are displayed) Labs Reviewed  CBC WITH DIFFERENTIAL/PLATELET - Abnormal; Notable for the following components:      Result Value   Platelets 422 (*)    All other components within normal limits  COMPREHENSIVE METABOLIC PANEL - Abnormal; Notable for the following components:   Potassium 3.2 (*)    Glucose, Bld 130 (*)    Calcium 8.5 (*)    All other components within normal limits  URINALYSIS, ROUTINE W REFLEX MICROSCOPIC - Abnormal; Notable for the following components:   Glucose, UA >=500 (*)    Hgb urine dipstick SMALL (*)  All other components within normal limits  URINALYSIS, MICROSCOPIC (REFLEX) - Abnormal; Notable for the following components:   Bacteria, UA MANY (*)    All other components within normal limits  URINE CULTURE  LIPASE,  BLOOD    EKG None  Radiology Ct Abdomen Pelvis W Contrast  Result Date: 07/27/2018 CLINICAL DATA:  48 year old female with abdominal pain, nausea vomiting diarrhea. EXAM: CT ABDOMEN AND PELVIS WITH CONTRAST TECHNIQUE: Multidetector CT imaging of the abdomen and pelvis was performed using the standard protocol following bolus administration of intravenous contrast. CONTRAST:  187mL OMNIPAQUE IOHEXOL 300 MG/ML  SOLN COMPARISON:  CT Abdomen and Pelvis 01/08/2011. FINDINGS: Lower chest: A 12 millimeter area of ground-glass opacity in the left lower lobe on series 4, image 4 is new. Otherwise negative. No pericardial or pleural effusion. Hepatobiliary: Negative liver and gallbladder. Pancreas: Negative. Spleen: Negative. Adrenals/Urinary Tract: Normal adrenal glands. Bilateral renal enhancement and contrast excretion is symmetric and normal. Mild hydroureter in the abdomen is probably related to mass effect at the pelvic inlet from the fibroid uterus. Diminutive and unremarkable urinary bladder. Stomach/Bowel: Redundant right colon. Intermittent retained stool throughout the large bowel. No large bowel inflammation. The cecum is on a lax mesentery. Negative terminal ileum. Appendix is not identified. No dilated small bowel. No mesenteric inflammation identified. Negative stomach. No free air, free fluid. Vascular/Lymphatic: Mild Calcified aortic atherosclerosis. Major arterial structures in the abdomen and pelvis are patent. Portal venous system is patent. No lymphadenopathy. Reproductive: Bulky chronic fibroid uterus with abundant dystrophic fibroid calcifications. Incidental tampon. Negative ovaries. Other: No pelvic free fluid. Musculoskeletal: No acute osseous abnormality identified. IMPRESSION: 1. No acute or inflammatory process identified in the abdomen or pelvis. 2. Bulky fibroid uterus, which is probably the etiology of mild bilateral hydroureter in the abdomen. 3. Small 12 mm area of ground-glass  opacity in the left lower lobe is new since 2012. Initial follow-up by chest CT without contrast is recommended in 3 months to confirm persistence. This recommendation follows the consensus statement: Recommendations for the Management of Subsolid Pulmonary Nodules Detected at CT: A Statement from the [place] as published in Radiology 2013; 266:304-317. Electronically Signed   By: Genevie Ann M.D.   On: 07/27/2018 23:21    Procedures Procedures (including critical care time)  Medications Ordered in ED Medications  potassium chloride SA (K-DUR,KLOR-CON) CR tablet 40 mEq (has no administration in time range)  sodium chloride 0.9 % bolus 1,000 mL (1,000 mLs Intravenous New Bag/Given 07/27/18 2141)  ondansetron (ZOFRAN) injection 4 mg (4 mg Intravenous Given 07/27/18 2144)  iohexol (OMNIPAQUE) 300 MG/ML solution 100 mL (100 mLs Intravenous Contrast Given 07/27/18 2255)     Initial Impression / Assessment and Plan / ED Course  I have reviewed the triage vital signs and the nursing notes.  Pertinent labs & imaging results that were available during my care of the patient were reviewed by me and considered in my medical decision making (see chart for details).  48 year old female who appears otherwise well presents for evaluation of emesis and diarrhea.  Symptom onset 2 days ago.  Seen by PCP was diagnosed influenza at that time.  Has had persistent sx at home. Has not taken her home Zofran for her symptoms which she was prescribed. Was not started on Tamiflu being outside treatment window at the time.  Patient is continued to have some clear rhinorrhea, cough.  Abdomen soft without rebound or guarding.  Some mild generalized abdominal tenderness.  Lungs clear to auscultation  bilateral without wheeze, rhonchi or rales.  No tachypnea, tachycardia or hypoxia, low suspicion for pneumonia.  She did have a chest x-ray 48 hours ago which was negative for infiltrates at the time.  No neck stiffness or neck  rigidity, no meningismus, low suspicion for meningitis.  Posterior oropharynx clear.  Will obtain labs and reevaluate.  CBC without leukocytosis, matabolic panel with mild hypokalemia at 3.2, PO supplementation provided, urinalysis negative for infection, lipase 23.    Patient is nontoxic, nonseptic appearing, in no apparent distress.  Patient's pain and other symptoms adequately managed in emergency department.  Fluid bolus given.  Able to tolerate p.o. intake without difficulty. Labs, imaging and vitals reviewed.  Patient does not meet the SIRS or Sepsis criteria.  On repeat exam patient does not have a surgical abdomin and there are no peritoneal signs.  No indication of appendicitis, bowel obstruction, bowel perforation, cholecystitis, diverticulitis.  Patient discharged home with symptomatic treatment and given strict instructions for follow-up with their primary care physician.  I have also discussed reasons to return immediately to the ER.  Patient expresses understanding and agrees with plan.     Final Clinical Impressions(s) / ED Diagnoses   Final diagnoses:  Nausea vomiting and diarrhea    ED Discharge Orders    None       Keaten Mashek A, PA-C 07/28/18 0018    Charlesetta Shanks, MD 07/29/18 1132

## 2018-07-27 NOTE — ED Notes (Signed)
Patient given water for PO challenge.  

## 2018-07-28 MED ORDER — POTASSIUM CHLORIDE CRYS ER 20 MEQ PO TBCR
40.0000 meq | EXTENDED_RELEASE_TABLET | Freq: Once | ORAL | Status: AC
  Administered 2018-07-28: 40 meq via ORAL
  Filled 2018-07-28: qty 2

## 2018-07-28 NOTE — Discharge Instructions (Signed)
You were evaluated today for vomiting and diarrhea.  This is likely symptoms from influenza which she recently diagnosed with.  Please take the Zofran which was prescribed to you from your PCP.  Please make sure to take small sips of liquids frequently so you do not become dehydrated.  Return to the ED for any new or worsening symptoms.

## 2018-07-30 LAB — URINE CULTURE

## 2018-09-15 ENCOUNTER — Other Ambulatory Visit: Payer: Self-pay

## 2018-09-20 ENCOUNTER — Ambulatory Visit: Payer: Self-pay | Admitting: Endocrinology

## 2018-10-21 ENCOUNTER — Other Ambulatory Visit: Payer: Self-pay | Admitting: Endocrinology

## 2018-11-06 ENCOUNTER — Ambulatory Visit (HOSPITAL_COMMUNITY)
Admission: EM | Admit: 2018-11-06 | Discharge: 2018-11-06 | Disposition: A | Discharge status: Home / Self Care | Payer: 59 | Attending: Family Medicine | Admitting: Family Medicine

## 2018-11-06 ENCOUNTER — Encounter (HOSPITAL_COMMUNITY): Payer: Self-pay | Admitting: Emergency Medicine

## 2018-11-06 ENCOUNTER — Other Ambulatory Visit: Payer: Self-pay

## 2018-11-06 DIAGNOSIS — L03011 Cellulitis of right finger: Secondary | ICD-10-CM

## 2018-11-06 MED ORDER — TRAMADOL HCL 50 MG PO TABS: 50.0000 mg | ORAL_TABLET | Freq: Four times a day (QID) | ORAL | 0 refills | Status: DC | PRN

## 2018-11-06 MED ORDER — CEPHALEXIN 500 MG PO CAPS: 500.0000 mg | ORAL_CAPSULE | Freq: Three times a day (TID) | ORAL | 0 refills | Status: DC

## 2018-11-06 NOTE — Discharge Instructions (Signed)
Warm soaks for 20 minutes 2 or 3 times a day Take antibiotic as directed Take pain medication as needed if pain is severe.  Do not take and drive or work Expect improvement over the next 2 to 3 days

## 2018-11-06 NOTE — ED Triage Notes (Signed)
Right index finger tip , around nail bed is painful and swollen.  Symptoms for a week.

## 2018-11-06 NOTE — ED Provider Notes (Signed)
[facility]    CSN: [id number] Arrival date & time: 11/06/18  1730      History   Chief Complaint Chief Complaint  Patient presents with  . Finger Injury    HPI Susan Whitaker is a 48 y.o. female.   HPI  Painful fingertip on right hand for about a week.  It is getting worse.  She states that last night it kept her awake.  She is a diabetic.  Sugars well controlled.  No injury.  No fever.  Past Medical History:  Diagnosis Date  . Allergy    allergic rhinitis  . Breast discharge    left milky spontaneous x 1 month with pain  . Diabetes mellitus    type II  . FHx: hypertension   . Gastric ulcer   . GERD (gastroesophageal reflux disease)   . H/O constipation   . H/O hemorrhoids   . H/O varicella   . Hyperlipidemia   . Hypertension   . Hypokalemia   . Increased BMI 06/2010  . Primary hyperaldosteronism ([place]) 02/21/2012  . SVT (supraventricular tachycardia) ([place])    Noted 11/2011 admission  . Thrombocytopenia ([place])   . Thyroid fullness 08/2005  . Urinary incontinence     Patient Active Problem List   Diagnosis Date Noted  . Dog scratch 07/06/2017  . Cellulitis 07/06/2017  . Bacterial vaginosis 07/15/2016  . Blurred vision, bilateral 06/11/2014  . Non-suppurative otitis media 03/28/2014  . Late menses 03/28/2014  . Fatigue 04/13/2013  . Right hip pain 10/31/2012  . Anxiety state 07/14/2012  . Routine general medical examination at a health care facility 07/14/2012  . Primary hyperaldosteronism ([place]) 02/21/2012  . Microalbuminuria 02/15/2012  . Multiple thyroid nodules 01/04/2012  . SVT (supraventricular tachycardia) ([place]) 12/29/2011  . Hypokalemia 11/28/2011  . Cervical pain (neck) 05/28/2011  . EXTERNAL HEMORRHOIDS WITHOUT MENTION COMP 02/07/2009  . Diabetes type 2, controlled ([place]) 02/07/2009  . ALLERGIC RHINITIS 05/24/2008  . Hyperlipemia 04/22/2008  . THROMBOCYTOSIS 04/22/2008  . URINARY INCONTINENCE 04/22/2008  . Essential hypertension  01/04/2007  . GERD 01/04/2007    Past Surgical History:  Procedure Laterality Date  . ABDOMINAL SURGERY     33 months of age-- unsure of type of surgery  . COLONOSCOPY  04/2016   hemorrhoids--normal per pt with Dr Collene Mares  . HEMORRHOID SURGERY  2005/2006  . UTERINE FIBROID EMBOLIZATION      OB History    Gravida  4   Para  2   Term  2   Preterm      AB  1   Living  2     SAB  1   TAB      Ectopic      Multiple      Live Births               Home Medications    Prior to Admission medications   Medication Sig Start Date End Date Taking? Authorizing Provider  AMBULATORY NON FORMULARY MEDICATION BORIC ACID CAPSULE 600mg . Insert 1 capsule vaginally two times a week. Patient taking differently: Place 600 mg vaginally See admin instructions. Boric acid capsules- 600 mg each: Insert 1 capsule (600 mg) per vagina two times a week as needed/as directed for yeast infections 07/13/16   Debbrah Alar, NP  amLODipine (NORVASC) 10 MG tablet TAKE 1 TABLET BY MOUTH ONCE DAILY 04/22/18   Debbrah Alar, NP  aspirin EC 81 MG tablet Take 1 tablet (81 mg total) by mouth  daily. 08/11/15   Debbrah Alar, NP  atorvastatin (LIPITOR) 20 MG tablet TAKE 1 TABLET BY MOUTH ONCE DAILY 07/03/18   Debbrah Alar, NP  carvedilol (COREG) 25 MG tablet TAKE 1 TABLET BY MOUTH TWICE DAILY WITH A MEAL Patient taking differently: Take 25 mg by mouth 2 (two) times daily with a meal.  11/02/17   Debbrah Alar, NP  cephALEXin (KEFLEX) 500 MG capsule Take 1 capsule (500 mg total) by mouth 3 (three) times daily. 11/06/18   Raylene Everts, MD  Dulaglutide (TRULICITY) 9.47 ML/4.6TK SOPN Inject in the abdominal skin as directed once a week 05/23/18   Elayne Snare, MD  glucose blood (ACCU-CHEK GUIDE) test strip 1 each by Other route 3 (three) times daily. Use as instructed to check once daily. 11/10/17   Elayne Snare, MD  INVOKANA 300 MG TABS tablet TAKE 1 TABLET BY MOUTH ONCE DAILY BEFORE  BREAKFAST 10/21/18   Elayne Snare, MD  JANUVIA 100 MG tablet TAKE 1 TABLET BY MOUTH ONCE DAILY 06/20/18   Debbrah Alar, NP  losartan (COZAAR) 100 MG tablet TAKE 1 TABLET BY MOUTH ONCE DAILY 04/22/18   Debbrah Alar, NP  metFORMIN (GLUCOPHAGE-XR) 500 MG 24 hr tablet Take 1 tablet (500 mg total) by mouth 2 (two) times daily. Patient taking differently: Take 500 mg by mouth daily.  11/03/17   Elayne Snare, MD  metoprolol tartrate (LOPRESSOR) 100 MG tablet TAKE 1 TABLET BY MOUTH TWICE DAILY 05/18/18   Debbrah Alar, NP  ondansetron (ZOFRAN) 4 MG tablet Take 1 tablet (4 mg total) by mouth every 8 (eight) hours as needed for nausea or vomiting. 07/25/18   Debbrah Alar, NP  [facility] DELICA LANCETS FINE MISC Use to check blood sugar 3 times daily 09/28/16   Elayne Snare, MD  Polyethyl Glycol-Propyl Glycol (SYSTANE PRESERVATIVE FREE OP) Place 2 drops into both eyes 4 (four) times daily.    [provider]  rosuvastatin (CRESTOR) 20 MG tablet Take 1 tablet (20 mg total) by mouth daily. 03/28/18   Debbrah Alar, NP  spironolactone (ALDACTONE) 100 MG tablet TAKE 1 TABLET BY MOUTH ONCE DAILY 04/22/18   Debbrah Alar, NP  traMADol (ULTRAM) 50 MG tablet Take 1-2 tablets (50-100 mg total) by mouth every 6 (six) hours as needed. 11/06/18   Raylene Everts, MD    Family History Family History  Problem Relation Age of Onset  . Cancer Mother        breast  . Stroke Mother   . Cancer Maternal Grandmother        breast  . Hypertension Maternal Grandmother   . Cancer Other        breast, lung  . Hypertension Other   . Stroke Other   . Vision loss Father   . Heart disease Father        Rhythm disturbance  . Diabetes Neg Hx     Social History Social History   Tobacco Use  . Smoking status: Never Smoker  . Smokeless tobacco: Never Used  Substance Use Topics  . Alcohol use: No  . Drug use: No     Allergies   Lisinopril   Review of Systems Review of Systems   Constitutional: Negative for chills and fever.  HENT: Negative for ear pain and sore throat.   Eyes: Negative for pain and visual disturbance.  Respiratory: Negative for cough and shortness of breath.   Cardiovascular: Negative for chest pain and palpitations.  Gastrointestinal: Negative for abdominal pain and vomiting.  Genitourinary: Negative for dysuria and hematuria.  Musculoskeletal: Negative for arthralgias and back pain.  Skin: Positive for wound. Negative for color change and rash.  Neurological: Negative for seizures and syncope.  All other systems reviewed and are negative.    Physical Exam Triage Vital Signs ED Triage Vitals  Enc Vitals Group     BP 11/06/18 1753 135/85     Pulse Rate 11/06/18 1753 68     Resp 11/06/18 1753 18     Temp 11/06/18 1753 98.7 F (37.1 C)     Temp Source 11/06/18 1753 Oral     SpO2 11/06/18 1753 96 %     Weight --      Height --      Head Circumference --      Peak Flow --      Pain Score 11/06/18 1750 5     Pain Loc --      Pain Edu? --      Excl. in [place]? --    No data found.  Updated Vital Signs BP 135/85 (BP Location: Right Arm) Comment (BP Location): large cuff  Pulse 68   Temp 98.7 F (37.1 C) (Oral)   Resp 18   LMP 10/30/2018   SpO2 96%   Visual Acuity Right Eye Distance:   Left Eye Distance:   Bilateral Distance:    Right Eye Near:   Left Eye Near:    Bilateral Near:     Physical Exam Constitutional:      General: She is not in acute distress.    Appearance: She is well-developed.  HENT:     Head: Normocephalic and atraumatic.  Eyes:     Conjunctiva/sclera: Conjunctivae normal.     Pupils: Pupils are equal, round, and reactive to light.  Neck:     Musculoskeletal: Normal range of motion.  Cardiovascular:     Rate and Rhythm: Normal rate.  Pulmonary:     Effort: Pulmonary effort is normal. No respiratory distress.  Abdominal:     General: There is no distension.     Palpations: Abdomen is soft.   Musculoskeletal: Normal range of motion.  Skin:    General: Skin is warm and dry.     Comments: Small paronychia is present at the lateral edge of the right index finger.  No purulence present to incise  Neurological:     General: No focal deficit present.     Mental Status: She is alert.  Psychiatric:        Mood and Affect: Mood normal.        Behavior: Behavior normal.      UC Treatments / Results  Labs (all labs ordered are listed, but only abnormal results are displayed) Labs Reviewed - No data to display  EKG None  Radiology No results found.  Procedures Procedures (including critical care time)  Medications Ordered in UC Medications - No data to display  Initial Impression / Assessment and Plan / UC Course  I have reviewed the triage vital signs and the nursing notes.  Pertinent labs & imaging results that were available during my care of the patient were reviewed by me and considered in my medical decision making (see chart for details).     Paronychia is discussed, treatment, expected improvement Final Clinical Impressions(s) / UC Diagnoses   Final diagnoses:  Paronychia of right index finger     Discharge Instructions     Warm soaks for 20 minutes 2 or 3 times a  day Take antibiotic as directed Take pain medication as needed if pain is severe.  Do not take and drive or work Expect improvement over the next 2 to 3 days   ED Prescriptions    Medication Sig Dispense Auth. Provider   cephALEXin (KEFLEX) 500 MG capsule Take 1 capsule (500 mg total) by mouth 3 (three) times daily. 15 capsule Raylene Everts, MD   traMADol (ULTRAM) 50 MG tablet Take 1-2 tablets (50-100 mg total) by mouth every 6 (six) hours as needed. 10 tablet Raylene Everts, MD     Controlled Substance Prescriptions [place] Controlled Substance Registry consulted? Yes, I have consulted the [place] Controlled Substances Registry for this patient, and feel the risk/benefit ratio today is  favorable for proceeding with this prescription for a controlled substance.   Raylene Everts, MD 11/06/18 [phone]

## 2018-12-09 ENCOUNTER — Other Ambulatory Visit: Payer: Self-pay | Admitting: Endocrinology

## 2018-12-11 ENCOUNTER — Other Ambulatory Visit: Payer: Self-pay

## 2018-12-11 ENCOUNTER — Telehealth: Payer: Self-pay | Admitting: Endocrinology

## 2018-12-11 MED ORDER — CANAGLIFLOZIN 300 MG PO TABS: ORAL_TABLET | ORAL | 3 refills | Status: DC

## 2018-12-11 NOTE — Telephone Encounter (Signed)
Rx sent 

## 2018-12-11 NOTE — Telephone Encounter (Signed)
MEDICATION: Invokana 300 MG Oral Tablet  PHARMACY:  [facility]  IS THIS A 90 DAY SUPPLY :   IS PATIENT OUT OF MEDICATION:   IF NOT; HOW MUCH IS LEFT:   LAST APPOINTMENT DATE: @7 /25/2020  NEXT APPOINTMENT DATE:@8 /11/2018  DO WE HAVE YOUR PERMISSION TO LEAVE A DETAILED MESSAGE:  OTHER COMMENTS:  Patient has made appointment  **Let patient know to contact pharmacy at the end of the day to make sure medication is ready. **  ** Please notify patient to allow 48-72 hours to process**  **Encourage patient to contact the pharmacy for refills or they can request refills through [facility]**

## 2018-12-22 ENCOUNTER — Other Ambulatory Visit (INDEPENDENT_AMBULATORY_CARE_PROVIDER_SITE_OTHER): Payer: 59

## 2018-12-22 ENCOUNTER — Other Ambulatory Visit: Payer: Self-pay

## 2018-12-22 DIAGNOSIS — E669 Obesity, unspecified: Secondary | ICD-10-CM

## 2018-12-22 DIAGNOSIS — E1169 Type 2 diabetes mellitus with other specified complication: Secondary | ICD-10-CM

## 2018-12-22 LAB — LIPID PANEL
Cholesterol: 180 mg/dL (ref 0–200)
HDL: 47.5 mg/dL (ref >39.00)
LDL Cholesterol: 118 mg/dL — ABNORMAL HIGH (ref 0–99)
NonHDL: 132.16
Total CHOL/HDL Ratio: 4
Triglycerides: 71 mg/dL (ref 0.0–149.0)
VLDL: 14.2 mg/dL (ref 0.0–40.0)

## 2018-12-22 LAB — HEMOGLOBIN A1C: Hgb A1c MFr Bld: 5.9 % (ref 4.6–6.5)

## 2018-12-22 LAB — COMPREHENSIVE METABOLIC PANEL
ALT: 15 U/L (ref 0–35)
AST: 14 U/L (ref 0–37)
Albumin: 4.6 g/dL (ref 3.5–5.2)
Alkaline Phosphatase: 45 U/L (ref 39–117)
BUN: 25 mg/dL — ABNORMAL HIGH (ref 6–23)
CO2: 26 mEq/L (ref 19–32)
Calcium: 10.1 mg/dL (ref 8.4–10.5)
Chloride: 101 mEq/L (ref 96–112)
Creatinine, Ser: 1.02 mg/dL (ref 0.40–1.20)
GFR: 69.47 mL/min (ref >60.00)
Glucose, Bld: 98 mg/dL (ref 70–99)
Potassium: 4.2 mEq/L (ref 3.5–5.1)
Sodium: 136 mEq/L (ref 135–145)
Total Bilirubin: 0.4 mg/dL (ref 0.2–1.2)
Total Protein: 8.5 g/dL — ABNORMAL HIGH (ref 6.0–8.3)

## 2018-12-25 ENCOUNTER — Encounter: Payer: 59 | Admitting: Endocrinology

## 2018-12-25 ENCOUNTER — Other Ambulatory Visit: Payer: Self-pay

## 2018-12-26 NOTE — Progress Notes (Signed)
This encounter was created in error - please disregard.

## 2018-12-27 ENCOUNTER — Other Ambulatory Visit: Payer: Self-pay | Admitting: Family

## 2018-12-31 ENCOUNTER — Telehealth: Payer: Self-pay | Admitting: Family

## 2018-12-31 NOTE — Telephone Encounter (Signed)
Pt due for follow up.  Please contact pt to arrange follow up visit.

## 2019-01-03 ENCOUNTER — Other Ambulatory Visit: Payer: Self-pay

## 2019-01-03 ENCOUNTER — Encounter: Payer: Self-pay | Admitting: Endocrinology

## 2019-01-03 NOTE — Telephone Encounter (Signed)
Appt scheduled

## 2019-01-05 ENCOUNTER — Encounter: Payer: Self-pay | Admitting: Family

## 2019-01-05 ENCOUNTER — Ambulatory Visit (INDEPENDENT_AMBULATORY_CARE_PROVIDER_SITE_OTHER): Payer: 59 | Admitting: Family

## 2019-01-05 ENCOUNTER — Other Ambulatory Visit: Payer: Self-pay

## 2019-01-05 VITALS — BP 128/80 | HR 74 | Temp 96.5°F | Resp 16 | Ht 62.0 in | Wt 194.0 lb

## 2019-01-05 DIAGNOSIS — I10 Essential (primary) hypertension: Secondary | ICD-10-CM

## 2019-01-05 DIAGNOSIS — E1122 Type 2 diabetes mellitus with diabetic chronic kidney disease: Secondary | ICD-10-CM | POA: Diagnosis not present

## 2019-01-05 DIAGNOSIS — E7800 Pure hypercholesterolemia, unspecified: Secondary | ICD-10-CM | POA: Diagnosis not present

## 2019-01-05 MED ORDER — ATORVASTATIN CALCIUM 20 MG PO TABS: 20.0000 mg | ORAL_TABLET | Freq: Every day | ORAL | 1 refills | Status: DC

## 2019-01-05 MED ORDER — LOSARTAN POTASSIUM 100 MG PO TABS: 100.0000 mg | ORAL_TABLET | Freq: Every day | ORAL | 1 refills | Status: DC

## 2019-01-05 MED ORDER — SITAGLIPTIN PHOSPHATE 100 MG PO TABS: 100.0000 mg | ORAL_TABLET | Freq: Every day | ORAL | 0 refills | Status: DC

## 2019-01-05 MED ORDER — SPIRONOLACTONE 100 MG PO TABS: 100.0000 mg | ORAL_TABLET | Freq: Every day | ORAL | 1 refills | Status: DC

## 2019-01-05 MED ORDER — AMLODIPINE BESYLATE 10 MG PO TABS: 10.0000 mg | ORAL_TABLET | Freq: Every day | ORAL | 1 refills | Status: DC

## 2019-01-05 MED ORDER — METOPROLOL TARTRATE 100 MG PO TABS: 100.0000 mg | ORAL_TABLET | Freq: Two times a day (BID) | ORAL | 1 refills | Status: DC

## 2019-01-05 NOTE — Patient Instructions (Signed)
Please continue current medications.

## 2019-01-05 NOTE — Progress Notes (Signed)
Subjective:    Patient ID: Susan Whitaker, female    DOB: Aug 03, 1970, 48 y.o.   MRN: [id number]  HPI  Patient is a 48 yr old female who presents today for follow up.  HTN- maintained on  losartan, metoprolol, and amlodipine and spironolactone.  She has been drinking more water and walking 30 minutes a day. Wt Readings from Last 3 Encounters:  01/05/19 194 lb (88 kg)  07/25/18 200 lb (90.7 kg)  06/23/18 206 lb (93.4 kg)    BP Readings from Last 3 Encounters:  01/05/19 128/80  11/06/18 135/85  07/27/18 139/74   Hyperlipidemia- Patient is maintained on atorvastatin 20 mg.  Lab Results  Component Value Date   CHOL 180 12/22/2018   HDL 47.50 12/22/2018   LDLCALC 118 (H) 12/22/2018   LDLDIRECT 103.1 03/04/2014   TRIG 71.0 12/22/2018   CHOLHDL 4 12/22/2018   DM2- Continues to follow with endocrinology.  Lab Results  Component Value Date   HGBA1C 5.9 12/22/2018   HGBA1C 5.9 (A) 06/23/2018   HGBA1C 8.1 (H) 03/24/2018   Lab Results  Component Value Date   MICROALBUR 1.5 11/07/2017   LDLCALC 118 (H) 12/22/2018   CREATININE 1.02 12/22/2018      Review of Systems    see HPI  Past Medical History:  Diagnosis Date  . Allergy    allergic rhinitis  . Breast discharge    left milky spontaneous x 1 month with pain  . Diabetes mellitus    type II  . FHx: hypertension   . Gastric ulcer   . GERD (gastroesophageal reflux disease)   . H/O constipation   . H/O hemorrhoids   . H/O varicella   . Hyperlipidemia   . Hypertension   . Hypokalemia   . Increased BMI 06/2010  . Primary hyperaldosteronism ([place]) 02/21/2012  . SVT (supraventricular tachycardia) ([place])    Noted 11/2011 admission  . Thrombocytopenia ([place])   . Thyroid fullness 08/2005  . Urinary incontinence      Social History   Socioeconomic History  . Marital status: Single    Spouse name: Not on file  . Number of children: 2  . Years of education: Not on file  . Highest education level: Not on file   Occupational History  . Occupation: YOUTH EMPLOYMENT    Employer: [employer] ([employer])  Social Needs  . Financial resource strain: Not on file  . Food insecurity    Worry: Not on file    Inability: Not on file  . Transportation needs    Medical: Not on file    Non-medical: Not on file  Tobacco Use  . Smoking status: Never Smoker  . Smokeless tobacco: Never Used  Substance and Sexual Activity  . Alcohol use: No  . Drug use: No  . Sexual activity: Not on file  Lifestyle  . Physical activity    Days per week: Not on file    Minutes per session: Not on file  . Stress: Not on file  Relationships  . Social Herbalist on phone: Not on file    Gets together: Not on file    Attends religious service: Not on file    Active member of club or organization: Not on file    Attends meetings of clubs or organizations: Not on file    Relationship status: Not on file  . Intimate partner violence    Fear of current or ex partner: Not on file  Emotionally abused: Not on file    Physically abused: Not on file    Forced sexual activity: Not on file  Other Topics Concern  . Not on file  Social History Narrative   Works at [employer]          Past Surgical History:  Procedure Laterality Date  . ABDOMINAL SURGERY     6 months of age-- unsure of type of surgery  . COLONOSCOPY  04/2016   hemorrhoids--normal per pt with Dr Collene Mares  . HEMORRHOID SURGERY  2005/2006  . UTERINE FIBROID EMBOLIZATION      Family History  Problem Relation Age of Onset  . Cancer Mother        breast  . Stroke Mother   . Cancer Maternal Grandmother        breast  . Hypertension Maternal Grandmother   . Cancer Other        breast, lung  . Hypertension Other   . Stroke Other   . Vision loss Father   . Heart disease Father        Rhythm disturbance  . Diabetes Neg Hx     Allergies  Allergen Reactions  . Lisinopril Cough    Current Outpatient Medications on File Prior to Visit  Medication  Sig Dispense Refill  . AMBULATORY NON FORMULARY MEDICATION BORIC ACID CAPSULE 600mg . Insert 1 capsule vaginally two times a week. (Patient taking differently: Place 600 mg vaginally See admin instructions. Boric acid capsules- 600 mg each: Insert 1 capsule (600 mg) per vagina two times a week as needed/as directed for yeast infections) 8 capsule 5  . aspirin EC 81 MG tablet Take 1 tablet (81 mg total) by mouth daily.    . canagliflozin (INVOKANA) 300 MG TABS tablet TAKE 1 TABLET BY MOUTH ONCE DAILY BEFORE BREAKFAST 30 tablet 3  . Dulaglutide (TRULICITY) A999333 0000000 SOPN Inject in the abdominal skin as directed once a week 4 pen 11  . glucose blood (ACCU-CHEK GUIDE) test strip 1 each by Other route 3 (three) times daily. Use as instructed to check once daily. 100 each 12  . metFORMIN (GLUCOPHAGE-XR) 500 MG 24 hr tablet Take 1 tablet (500 mg total) by mouth 2 (two) times daily. (Patient taking differently: Take 500 mg by mouth daily. ) 180 tablet 1  . ondansetron (ZOFRAN) 4 MG tablet Take 1 tablet (4 mg total) by mouth every 8 (eight) hours as needed for nausea or vomiting. 20 tablet 0  . ONETOUCH DELICA LANCETS FINE MISC Use to check blood sugar 3 times daily 100 each 3  . Polyethyl Glycol-Propyl Glycol (SYSTANE PRESERVATIVE FREE OP) Place 2 drops into both eyes 4 (four) times daily.     No current facility-administered medications on file prior to visit.     BP 128/80 (BP Location: Left Arm, Patient Position: Sitting, Cuff Size: Normal)   Pulse 74   Temp (!) 96.5 F (35.8 C) (Temporal)   Resp 16   Ht 5\' 2"  (1.575 m)   Wt 194 lb (88 kg)   SpO2 98%   BMI 35.48 kg/m    Objective:   Physical Exam Constitutional:      Appearance: She is well-developed.  Neck:     Musculoskeletal: Neck supple.     Thyroid: No thyromegaly.  Cardiovascular:     Rate and Rhythm: Normal rate and regular rhythm.     Heart sounds: Normal heart sounds. No murmur.  Pulmonary:     Effort: Pulmonary effort is  normal. No respiratory distress.     Breath sounds: Normal breath sounds. No wheezing.  Skin:    General: Skin is warm and dry.  Neurological:     Mental Status: She is alert and oriented to person, place, and time.  Psychiatric:        Behavior: Behavior normal.        Thought Content: Thought content normal.        Judgment: Judgment normal.           Assessment & Plan:  HTN-bp stable, continue current meds.  DM2- great control, continues to work with endocrinology.  Hyperlipidemia- LDL above goal. Advised pt to restart statin.

## 2019-03-16 LAB — HM DIABETES EYE EXAM

## 2019-03-18 LAB — RESULTS CONSOLE HPV: CHL HPV: NEGATIVE

## 2019-03-18 LAB — HM PAP SMEAR

## 2019-03-19 ENCOUNTER — Other Ambulatory Visit (HOSPITAL_BASED_OUTPATIENT_CLINIC_OR_DEPARTMENT_OTHER): Payer: Self-pay | Admitting: Family

## 2019-03-19 DIAGNOSIS — Z1231 Encounter for screening mammogram for malignant neoplasm of breast: Secondary | ICD-10-CM

## 2019-03-21 ENCOUNTER — Telehealth: Payer: Self-pay | Admitting: Endocrinology

## 2019-03-21 NOTE — Telephone Encounter (Signed)
Susan Whitaker [phone]  Susan Whitaker called to schedule an appointment, I seen where she missed one in August, she wants to come in so I scheduled and appointment, she stated that she didn't keep other appointment because someone called and said she didn't have something. She would also like o discuss having the device that you wear so she does not have to keep sticking herself. Does she need labs prior to appointment.

## 2019-03-28 ENCOUNTER — Other Ambulatory Visit: Payer: Self-pay

## 2019-03-28 ENCOUNTER — Ambulatory Visit (HOSPITAL_BASED_OUTPATIENT_CLINIC_OR_DEPARTMENT_OTHER)
Admission: RE | Admit: 2019-03-28 | Discharge: 2019-03-28 | Disposition: A | Discharge status: Home / Self Care | Payer: 59 | Source: Ambulatory Visit | Attending: Family | Admitting: Family

## 2019-03-28 DIAGNOSIS — Z1231 Encounter for screening mammogram for malignant neoplasm of breast: Secondary | ICD-10-CM | POA: Insufficient documentation

## 2019-04-03 ENCOUNTER — Other Ambulatory Visit: Payer: Self-pay

## 2019-04-04 NOTE — Progress Notes (Signed)
Patient ID: Susan Whitaker, female   DOB: [birth year], 48 y.o.   MRN: [id number]           Reason for Appointment: Follow-up for Type 2 Diabetes  Referring physician:  Debbrah Alar   History of Present Illness:          Date of diagnosis of type 2 diabetes mellitus: 2013        Background history:  She was diagnosed to have diabetes when she was having fibroid surgery. A1c in 2013 was 6.9 She was treated with metformin but she says she was not able to take this because of diarrhea and would be irregular with it However her blood sugars stayed about the same until 2015 when they were higher and glipizide added Her blood sugars had been much higher since about late 2015 with A1c in the 9-10 range with her taking glipizide alone  Recent history:   Non-insulin hypoglycemic drugs: Invokana [id number] mg daily, Trulicity [id number] mg, currently not on Metformin  Current management, blood sugar patterns and problems identified:  Her A1c had gone up to 8.1 in November 19 but has been below 6% subsequently   She has benefited from taking Trulicity and blood sugars have been consistently controlled  With this also she started losing weight  However she has not been seen in follow-up since 2/20  Blood sugars have not been checked at home as she does not like to do fingersticks  Blood sugar today in the office is 91  Although she had previously been doing a lot of walking she is only starting back on this this week and is able to walk at work again  She thinks she has been able to watch her portions and cut back on snacks and sweets  Since her last visit weight is only slightly better  She stopped her Metformin 2 or 3 months ago when there was a recall on the medication but did not call for replacement  Side effects from medications have been: Diarrhea from regular metformin  Compliance with the medical regimen: Excellent  Glucose monitoring:  Does not have a functioning meter     Blood Glucose readings not checked   Self-care: The diet that the patient has been following is: tries to limit Portions .     Typical meal intake: Breakfast is  fruit, Activia Yogurt,  For snacks she will have Snack box, chicken breast at dinnertime.  No Sweet drinks                  Dietician visit, most recent: 5/17, has  had diabetes education classes               Exercise: Walking 5/7, 30 min  Weight history:  Wt Readings from Last 3 Encounters:  04/05/19 202 lb 6.4 oz (91.8 kg)  01/05/19 194 lb (88 kg)  07/25/18 200 lb (90.7 kg)    Glycemic control:   Lab Results  Component Value Date   HGBA1C 5.4 04/05/2019   HGBA1C 5.9 12/22/2018   HGBA1C 5.9 (A) 06/23/2018   Lab Results  Component Value Date   MICROALBUR 1.5 11/07/2017   LDLCALC 118 (H) 12/22/2018   CREATININE 1.02 12/22/2018   Lab Results  Component Value Date   MICRALBCREAT 2.2 11/07/2017   Office Visit on 04/05/2019  Component Date Value Ref Range Status  . Hemoglobin A1C 04/05/2019 5.4  4.0 - 5.6 % Final  . POC Glucose 04/05/2019 91  70 -  99 mg/dl Final     Other active problems are in review of systems    Allergies as of 04/05/2019      Reactions   Lisinopril Cough      Medication List       Accurate as of April 05, 2019  4:32 PM. If you have any questions, ask your nurse or doctor.        AMBULATORY NON FORMULARY MEDICATION BORIC ACID CAPSULE 600mg . Insert 1 capsule vaginally two times a week. What changed:   how much to take  how to take this  when to take this  additional instructions   amLODipine 10 MG tablet Commonly known as: NORVASC Take 1 tablet (10 mg total) by mouth daily.   aspirin EC 81 MG tablet Take 1 tablet (81 mg total) by mouth daily.   atorvastatin 20 MG tablet Commonly known as: LIPITOR Take 1 tablet (20 mg total) by mouth daily.   canagliflozin 300 MG Tabs tablet Commonly known as: Invokana TAKE 1 TABLET BY MOUTH ONCE DAILY BEFORE BREAKFAST    Dulaglutide 0.75 MG/0.5ML Sopn Commonly known as: Trulicity Inject in the abdominal skin as directed once a week   glucose blood test strip Commonly known as: Accu-Chek Guide 1 each by Other route 3 (three) times daily. Use as instructed to check once daily.   losartan 100 MG tablet Commonly known as: COZAAR Take 1 tablet (100 mg total) by mouth daily.   metFORMIN 500 MG 24 hr tablet Commonly known as: GLUCOPHAGE-XR Take 1 tablet (500 mg total) by mouth 2 (two) times daily. What changed: when to take this   metoprolol tartrate 100 MG tablet Commonly known as: LOPRESSOR Take 1 tablet (100 mg total) by mouth 2 (two) times daily.   ondansetron 4 MG tablet Commonly known as: Zofran Take 1 tablet (4 mg total) by mouth every 8 (eight) hours as needed for nausea or vomiting.   OneTouch Delica Lancets Fine Misc Use to check blood sugar 3 times daily   sitaGLIPtin 100 MG tablet Commonly known as: Januvia Take 1 tablet (100 mg total) by mouth daily.   spironolactone 100 MG tablet Commonly known as: ALDACTONE Take 1 tablet (100 mg total) by mouth daily.   SYSTANE PRESERVATIVE FREE OP Place 2 drops into both eyes 4 (four) times daily.       Allergies:  Allergies  Allergen Reactions  . Lisinopril Cough    Past Medical History:  Diagnosis Date  . Allergy    allergic rhinitis  . Breast discharge    left milky spontaneous x 1 month with pain  . Diabetes mellitus    type II  . FHx: hypertension   . Gastric ulcer   . GERD (gastroesophageal reflux disease)   . H/O constipation   . H/O hemorrhoids   . H/O varicella   . Hyperlipidemia   . Hypertension   . Hypokalemia   . Increased BMI 06/2010  . Primary hyperaldosteronism ([place]) 02/21/2012  . SVT (supraventricular tachycardia) ([place])    Noted 11/2011 admission  . Thrombocytopenia ([place])   . Thyroid fullness 08/2005  . Urinary incontinence     Past Surgical History:  Procedure Laterality Date  . ABDOMINAL SURGERY      55 months of age-- unsure of type of surgery  . COLONOSCOPY  04/2016   hemorrhoids--normal per pt with Dr Collene Mares  . HEMORRHOID SURGERY  2005/2006  . UTERINE FIBROID EMBOLIZATION      Family History  Problem Relation  Age of Onset  . Cancer Mother        breast  . Stroke Mother   . Cancer Maternal Grandmother        breast  . Hypertension Maternal Grandmother   . Cancer Other        breast, lung  . Hypertension Other   . Stroke Other   . Vision loss Father   . Heart disease Father        Rhythm disturbance  . Diabetes Neg Hx     Social History:  reports that she has never smoked. She has never used smokeless tobacco. She reports that she does not drink alcohol or use drugs.    Review of Systems    Lipid history: LDL 171 At baseline Taking Lipitor 20 mg for control prescribed by PCP    Lab Results  Component Value Date   CHOL 180 12/22/2018   HDL 47.50 12/22/2018   LDLCALC 118 (H) 12/22/2018   LDLDIRECT 103.1 03/04/2014   TRIG 71.0 12/22/2018   CHOLHDL 4 12/22/2018           Hypertension:   She is on amlodipine, metoprolol and losartan 100 mg Because of hypokalemia she is also on 100 mg of Aldactone  No further hypokalemia     BP Readings from Last 3 Encounters:  04/05/19 110/84  01/05/19 128/80  11/06/18 135/85    Lab Results  Component Value Date   CREATININE 1.02 12/22/2018   BUN 25 (H) 12/22/2018   NA 136 12/22/2018   K 4.2 12/22/2018   CL 101 12/22/2018   CO2 26 12/22/2018     History of goiter with multiple nodules, stable as of 2015, largest 1.4 cm nodule in the right side   Most recent foot exam: 03/2018 Eye exam normal in 02/2019   Review of Systems    Office Visit on 04/05/2019  Component Date Value Ref Range Status  . Hemoglobin A1C 04/05/2019 5.4  4.0 - 5.6 % Final  . POC Glucose 04/05/2019 91  70 - 99 mg/dl Final    Physical Examination:  BP 110/84 (BP Location: Left Arm, Patient Position: Sitting, Cuff Size: Normal)    Pulse 76   Ht 5\' 2"  (1.575 m)   Wt 202 lb 6.4 oz (91.8 kg)   LMP 03/21/2019   SpO2 98%   BMI 37.02 kg/m   Right thyroid lobe is 1-1/2 times normal, felt better on swallowing, slightly firm Left lobe only minimally palpable  No ankle edema    ASSESSMENT/PLAN:  Diabetes type 2, obese  See history of present illness for detailed discussion of current diabetes management, blood sugar patterns and problems identified  Her A1c is the best in a long time at 5.9, previously had gone up to 8.1  She is on a regimen with Trulicity A999333, Invokana 300 mg only  With Trulicity she is able to watch her diet can keep her weight down Also despite not taking 500 mg Metformin her blood sugars do not appear to be higher However she does not monitor blood sugars at home and will not do fingersticks  She has just started back her walking program and likely to do better Does not need any change in her medication regimen including low-dose Trulicity  She wants to try the freestyle libre system and discussed in detail how this will be used and information derived Patient information given to her Prescription has been sent and she will call if she needs any help with  setting it up  HYPERTENSION:  This is controlled with multiple medications Also hypokalemia is consistently controlled with 100 mg of Aldactone  We will check urine microalbumin today   Lipids: Will need follow-up with PCP, likely needs higher dose of atorvastatin as last LDL was 118  GOITER: This is small and likely stable, she will have labs done from her PCP and needs to have TSH checked again  Total visit time for evaluation and management of multiple problems and counseling =25 minutes  There are no Patient Instructions on file for this visit.  Elayne Snare 04/05/2019, 4:32 PM   Note: This office note was prepared with Dragon voice recognition system technology. Any transcriptional errors that result from this process are  unintentional.

## 2019-04-05 ENCOUNTER — Other Ambulatory Visit: Payer: Self-pay

## 2019-04-05 ENCOUNTER — Ambulatory Visit (INDEPENDENT_AMBULATORY_CARE_PROVIDER_SITE_OTHER): Payer: 59 | Admitting: Endocrinology

## 2019-04-05 ENCOUNTER — Encounter: Payer: Self-pay | Admitting: Endocrinology

## 2019-04-05 VITALS — BP 110/84 | HR 76 | Ht 62.0 in | Wt 202.4 lb

## 2019-04-05 DIAGNOSIS — E042 Nontoxic multinodular goiter: Secondary | ICD-10-CM

## 2019-04-05 DIAGNOSIS — I10 Essential (primary) hypertension: Secondary | ICD-10-CM

## 2019-04-05 DIAGNOSIS — E1169 Type 2 diabetes mellitus with other specified complication: Secondary | ICD-10-CM

## 2019-04-05 DIAGNOSIS — E669 Obesity, unspecified: Secondary | ICD-10-CM | POA: Diagnosis not present

## 2019-04-05 LAB — POCT GLYCOSYLATED HEMOGLOBIN (HGB A1C): Hemoglobin A1C: 5.4 % (ref 4.0–5.6)

## 2019-04-05 LAB — GLUCOSE, POCT (MANUAL RESULT ENTRY): POC Glucose: 91 mg/dl (ref 70–99)

## 2019-04-05 MED ORDER — FREESTYLE LIBRE 2 SENSOR SYSTM MISC: 1.0000 [IU] | Freq: Once | 3 refills | Status: AC

## 2019-04-05 MED ORDER — FREESTYLE LIBRE 2 READER SYSTM DEVI: 1.0000 | Freq: Once | 0 refills | Status: AC

## 2019-04-06 LAB — URINALYSIS, ROUTINE W REFLEX MICROSCOPIC
Bilirubin Urine: NEGATIVE
Ketones, ur: NEGATIVE
Leukocytes,Ua: NEGATIVE
Nitrite: NEGATIVE
Specific Gravity, Urine: 1.02 (ref 1.000–1.030)
Total Protein, Urine: NEGATIVE
Urine Glucose: >=1000 — AB
Urobilinogen, UA: 0.2 (ref 0.0–1.0)
WBC, UA: NONE SEEN (ref 0–?)
pH: 6 (ref 5.0–8.0)

## 2019-04-06 LAB — MICROALBUMIN / CREATININE URINE RATIO
Creatinine,U: 122.2 mg/dL
Microalb Creat Ratio: 0.8 mg/g (ref 0.0–30.0)
Microalb, Ur: 1 mg/dL (ref 0.0–1.9)

## 2019-04-09 ENCOUNTER — Other Ambulatory Visit: Payer: Self-pay | Admitting: Endocrinology

## 2019-04-09 MED ORDER — TRULICITY 1.5 MG/0.5ML ~~LOC~~ SOAJ: SUBCUTANEOUS | 3 refills | Status: DC

## 2019-04-10 ENCOUNTER — Other Ambulatory Visit: Payer: Self-pay

## 2019-04-11 ENCOUNTER — Encounter: Payer: Self-pay | Admitting: Family

## 2019-04-11 ENCOUNTER — Telehealth: Payer: Self-pay | Admitting: Family

## 2019-04-11 ENCOUNTER — Ambulatory Visit (INDEPENDENT_AMBULATORY_CARE_PROVIDER_SITE_OTHER): Payer: 59 | Admitting: Family

## 2019-04-11 VITALS — BP 128/78 | HR 72 | Temp 96.7°F | Resp 16 | Ht 62.0 in | Wt 195.0 lb

## 2019-04-11 DIAGNOSIS — Z23 Encounter for immunization: Secondary | ICD-10-CM | POA: Diagnosis not present

## 2019-04-11 DIAGNOSIS — E042 Nontoxic multinodular goiter: Secondary | ICD-10-CM | POA: Diagnosis not present

## 2019-04-11 DIAGNOSIS — Z0000 Encounter for general adult medical examination without abnormal findings: Secondary | ICD-10-CM

## 2019-04-11 LAB — CBC WITH DIFFERENTIAL/PLATELET
Basophils Absolute: 0.1 10*3/uL (ref 0.0–0.1)
Basophils Relative: 0.7 % (ref 0.0–3.0)
Eosinophils Absolute: 0.2 10*3/uL (ref 0.0–0.7)
Eosinophils Relative: 2 % (ref 0.0–5.0)
HCT: 40.3 % (ref 36.0–46.0)
Hemoglobin: 13.4 g/dL (ref 12.0–15.0)
Lymphocytes Relative: 34.7 % (ref 12.0–46.0)
Lymphs Abs: 3 10*3/uL (ref 0.7–4.0)
MCHC: 33.3 g/dL (ref 30.0–36.0)
MCV: 90 fl (ref 78.0–100.0)
Monocytes Absolute: 0.5 10*3/uL (ref 0.1–1.0)
Monocytes Relative: 5.6 % (ref 3.0–12.0)
Neutro Abs: 5 10*3/uL (ref 1.4–7.7)
Neutrophils Relative %: 57 % (ref 43.0–77.0)
Platelets: 436 10*3/uL — ABNORMAL HIGH (ref 150.0–400.0)
RBC: 4.47 Mil/uL (ref 3.87–5.11)
RDW: 13.7 % (ref 11.5–15.5)
WBC: 8.7 10*3/uL (ref 4.0–10.5)

## 2019-04-11 LAB — LIPID PANEL
Cholesterol: 205 mg/dL — ABNORMAL HIGH (ref 0–200)
HDL: 53 mg/dL (ref >39.00)
LDL Cholesterol: 141 mg/dL — ABNORMAL HIGH (ref 0–99)
NonHDL: 152
Total CHOL/HDL Ratio: 4
Triglycerides: 56 mg/dL (ref 0.0–149.0)
VLDL: 11.2 mg/dL (ref 0.0–40.0)

## 2019-04-11 LAB — HEPATIC FUNCTION PANEL
ALT: 13 U/L (ref 0–35)
AST: 13 U/L (ref 0–37)
Albumin: 4 g/dL (ref 3.5–5.2)
Alkaline Phosphatase: 47 U/L (ref 39–117)
Bilirubin, Direct: 0.1 mg/dL (ref 0.0–0.3)
Total Bilirubin: 0.4 mg/dL (ref 0.2–1.2)
Total Protein: 8 g/dL (ref 6.0–8.3)

## 2019-04-11 LAB — BASIC METABOLIC PANEL
BUN: 23 mg/dL (ref 6–23)
CO2: 27 mEq/L (ref 19–32)
Calcium: 9.5 mg/dL (ref 8.4–10.5)
Chloride: 101 mEq/L (ref 96–112)
Creatinine, Ser: 0.92 mg/dL (ref 0.40–1.20)
GFR: 78.15 mL/min (ref >60.00)
Glucose, Bld: 93 mg/dL (ref 70–99)
Potassium: 4.3 mEq/L (ref 3.5–5.1)
Sodium: 137 mEq/L (ref 135–145)

## 2019-04-11 LAB — TSH: TSH: 1.7 u[IU]/mL (ref 0.35–4.50)

## 2019-04-11 LAB — T4, FREE: Free T4: 0.72 ng/dL (ref 0.60–1.60)

## 2019-04-11 NOTE — Progress Notes (Addendum)
Subjective:    Patient ID: Susan Whitaker, female    DOB: Oct 25, 1970, 48 y.o.   MRN: [id number]  HPI  Patient presents today for complete physical.  Immunizations: flu shot  Diet:  Diet is healthy Exercise: recently started exercising Colonoscopy: had colonoscopy  Pap Smear:  11/17- has appointment with GYN 04/24/19 [facility]) Mammogram: 03/28/19 Vision: just completed Dental:  yesterday  Wt Readings from Last 3 Encounters:  04/11/19 195 lb (88.5 kg)  04/05/19 202 lb 6.4 oz (91.8 kg)  01/05/19 194 lb (88 kg)      Review of Systems  Constitutional: Negative for unexpected weight change.  HENT: Negative for hearing loss and rhinorrhea.   Eyes: Negative for visual disturbance.  Respiratory: Negative for cough and shortness of breath.   Cardiovascular: Negative for chest pain.  Gastrointestinal: Negative for constipation and diarrhea.  Genitourinary: Positive for menstrual problem (heavy periods with cramping will see gyn about this).  Musculoskeletal: Negative for arthralgias and myalgias.  Skin: Negative for rash.  Neurological: Negative for headaches.  Hematological: Negative for adenopathy.  Psychiatric/Behavioral:       Denies depression/anxiety   Past Medical History:  Diagnosis Date  . Allergy    allergic rhinitis  . Breast discharge    left milky spontaneous x 1 month with pain  . Diabetes mellitus    type II  . FHx: hypertension   . Gastric ulcer   . GERD (gastroesophageal reflux disease)   . H/O constipation   . H/O hemorrhoids   . H/O varicella   . Hyperlipidemia   . Hypertension   . Hypokalemia   . Increased BMI 06/2010  . Primary hyperaldosteronism ([place]) 02/21/2012  . SVT (supraventricular tachycardia) ([place])    Noted 11/2011 admission  . Thrombocytopenia ([place])   . Thyroid fullness 08/2005  . Urinary incontinence      Social History   Socioeconomic History  . Marital status: Single    Spouse name: Not on file  . Number of  children: 2  . Years of education: Not on file  . Highest education level: Not on file  Occupational History  . Occupation: YOUTH EMPLOYMENT    Employer: [employer] ([employer])  Social Needs  . Financial resource strain: Not on file  . Food insecurity    Worry: Not on file    Inability: Not on file  . Transportation needs    Medical: Not on file    Non-medical: Not on file  Tobacco Use  . Smoking status: Never Smoker  . Smokeless tobacco: Never Used  Substance and Sexual Activity  . Alcohol use: No  . Drug use: No  . Sexual activity: Not on file  Lifestyle  . Physical activity    Days per week: Not on file    Minutes per session: Not on file  . Stress: Not on file  Relationships  . Social Herbalist on phone: Not on file    Gets together: Not on file    Attends religious service: Not on file    Active member of club or organization: Not on file    Attends meetings of clubs or organizations: Not on file    Relationship status: Not on file  . Intimate partner violence    Fear of current or ex partner: Not on file    Emotionally abused: Not on file    Physically abused: Not on file    Forced sexual activity: Not on file  Other  Topics Concern  . Not on file  Social History Narrative   Works at [employer]          Past Surgical History:  Procedure Laterality Date  . ABDOMINAL SURGERY     56 months of age-- unsure of type of surgery  . COLONOSCOPY  04/2016   hemorrhoids--normal per pt with Dr Collene Mares  . HEMORRHOID SURGERY  2005/2006  . UTERINE FIBROID EMBOLIZATION      Family History  Problem Relation Age of Onset  . Cancer Mother        breast  . Stroke Mother   . Cancer Maternal Grandmother        breast  . Hypertension Maternal Grandmother   . Cancer Other        breast, lung  . Hypertension Other   . Stroke Other   . Vision loss Father        some  . Heart disease Father        Rhythm disturbance  . Hypertension Sister   . Hypertension Brother    . Hypertension Brother   . Diabetes Neg Hx     Allergies  Allergen Reactions  . Lisinopril Cough    Current Outpatient Medications on File Prior to Visit  Medication Sig Dispense Refill  . AMBULATORY NON FORMULARY MEDICATION BORIC ACID CAPSULE 600mg . Insert 1 capsule vaginally two times a week. (Patient taking differently: Place 600 mg vaginally See admin instructions. Boric acid capsules- 600 mg each: Insert 1 capsule (600 mg) per vagina two times a week as needed/as directed for yeast infections) 8 capsule 5  . amLODipine (NORVASC) 10 MG tablet Take 1 tablet (10 mg total) by mouth daily. 90 tablet 1  . aspirin EC 81 MG tablet Take 1 tablet (81 mg total) by mouth daily.    Marland Kitchen atorvastatin (LIPITOR) 20 MG tablet Take 1 tablet (20 mg total) by mouth daily. 90 tablet 1  . canagliflozin (INVOKANA) 300 MG TABS tablet TAKE 1 TABLET BY MOUTH ONCE DAILY BEFORE BREAKFAST 30 tablet 3  . Dulaglutide (TRULICITY) 1.5 0000000 SOPN Inject 1.5 mg weekly 4 pen 3  . glucose blood (ACCU-CHEK GUIDE) test strip 1 each by Other route 3 (three) times daily. Use as instructed to check once daily. 100 each 12  . losartan (COZAAR) 100 MG tablet Take 1 tablet (100 mg total) by mouth daily. 90 tablet 1  . metoprolol tartrate (LOPRESSOR) 100 MG tablet Take 1 tablet (100 mg total) by mouth 2 (two) times daily. 180 tablet 1  . ondansetron (ZOFRAN) 4 MG tablet Take 1 tablet (4 mg total) by mouth every 8 (eight) hours as needed for nausea or vomiting. 20 tablet 0  . ONETOUCH DELICA LANCETS FINE MISC Use to check blood sugar 3 times daily 100 each 3  . Polyethyl Glycol-Propyl Glycol (SYSTANE PRESERVATIVE FREE OP) Place 2 drops into both eyes 4 (four) times daily.    Marland Kitchen spironolactone (ALDACTONE) 100 MG tablet Take 1 tablet (100 mg total) by mouth daily. 90 tablet 1   No current facility-administered medications on file prior to visit.     BP 128/78 (BP Location: Right Arm, Patient Position: Sitting, Cuff Size: Large)    Pulse 72   Temp (!) 96.7 F (35.9 C) (Temporal)   Resp 16   Ht 5\' 2"  (1.575 m)   Wt 195 lb (88.5 kg)   LMP 03/21/2019   SpO2 100%   BMI 35.67 kg/m       Objective:  Physical Exam  Physical Exam  Constitutional: She is oriented to person, place, and time. She appears well-developed and well-nourished. No distress.  HENT:  Head: Normocephalic and atraumatic.  Right Ear: Tympanic membrane and ear canal normal.  Left Ear: Tympanic membrane and ear canal normal.  Mouth/Throat: Not examined. Pt wearing mask  Eyes: Pupils are equal, round, and reactive to light. No scleral icterus.  Neck: Normal range of motion. No thyromegaly present.  Cardiovascular: Normal rate and regular rhythm.   No murmur heard. Pulmonary/Chest: Effort normal and breath sounds normal. No respiratory distress. He has no wheezes. She has no rales. She exhibits no tenderness.  Abdominal: Soft. Bowel sounds are normal. She exhibits no distension and no mass. There is no tenderness. There is no rebound and no guarding.  Musculoskeletal: She exhibits no edema.  Lymphadenopathy:    She has no cervical adenopathy.  Neurological: She is alert and oriented to person, place, and time. She has normal patellar reflexes. She exhibits normal muscle tone. Coordination normal.  Skin: Skin is warm and dry.  Psychiatric: She has a normal mood and affect. Her behavior is normal. Judgment and thought content normal.  Breast/pelvic: deferred           Assessment & Plan:         Assessment & Plan:   Preventative care- encouraged pt to continue healthy diet and regular exercise.  Mammo/colo up to date. She is scheduled for pap smear. Shingrix #1 today. Advised use of otc lotrisone cream for mild tinea pedis infection (see DM foot exam).  This visit occurred during the SARS-CoV-2 public health emergency.  Safety protocols were in place, including screening questions prior to the visit, additional usage of staff PPE, and  extensive cleaning of exam room while observing appropriate contact time as indicated for disinfecting solutions.

## 2019-04-11 NOTE — Addendum Note (Signed)
Addended by: Caffie Pinto on: 04/11/2019 10:53 AM   Modules accepted: Orders

## 2019-04-11 NOTE — Telephone Encounter (Signed)
Please call Dr. Lorie Apley office and request copy of her colonoscopy.

## 2019-04-11 NOTE — Telephone Encounter (Signed)
Records release faxed to Dr Collene Mares

## 2019-04-15 ENCOUNTER — Encounter (HOSPITAL_BASED_OUTPATIENT_CLINIC_OR_DEPARTMENT_OTHER): Payer: Self-pay | Admitting: Emergency Medicine

## 2019-04-15 ENCOUNTER — Other Ambulatory Visit: Payer: Self-pay

## 2019-04-15 ENCOUNTER — Emergency Department (HOSPITAL_BASED_OUTPATIENT_CLINIC_OR_DEPARTMENT_OTHER)
Admission: EM | Admit: 2019-04-15 | Discharge: 2019-04-15 | Disposition: A | Discharge status: Home / Self Care | Payer: 59 | Attending: Emergency Medicine | Admitting: Emergency Medicine

## 2019-04-15 DIAGNOSIS — I10 Essential (primary) hypertension: Secondary | ICD-10-CM | POA: Insufficient documentation

## 2019-04-15 DIAGNOSIS — K0889 Other specified disorders of teeth and supporting structures: Secondary | ICD-10-CM | POA: Diagnosis not present

## 2019-04-15 DIAGNOSIS — E119 Type 2 diabetes mellitus without complications: Secondary | ICD-10-CM | POA: Insufficient documentation

## 2019-04-15 DIAGNOSIS — Z79899 Other long term (current) drug therapy: Secondary | ICD-10-CM | POA: Diagnosis not present

## 2019-04-15 DIAGNOSIS — E785 Hyperlipidemia, unspecified: Secondary | ICD-10-CM | POA: Diagnosis not present

## 2019-04-15 MED ORDER — NAPROXEN 500 MG PO TABS: 500.0000 mg | ORAL_TABLET | Freq: Two times a day (BID) | ORAL | 0 refills | Status: DC

## 2019-04-15 MED ORDER — KETOROLAC TROMETHAMINE 15 MG/ML IJ SOLN
15.0000 mg | Freq: Once | INTRAMUSCULAR | Status: AC
  Administered 2019-04-15: 15 mg via INTRAMUSCULAR
  Filled 2019-04-15: qty 1

## 2019-04-15 MED ORDER — PENICILLIN V POTASSIUM 500 MG PO TABS: 500.0000 mg | ORAL_TABLET | Freq: Four times a day (QID) | ORAL | 0 refills | Status: AC

## 2019-04-15 MED ORDER — NAPROXEN 375 MG PO TABS: 375.0000 mg | ORAL_TABLET | Freq: Two times a day (BID) | ORAL | 0 refills | Status: DC

## 2019-04-15 NOTE — Discharge Instructions (Signed)
Take penicillin as prescribed until completed.  Make sure to finish all of this medication, even if you are feeling better.  Take Naprosyn twice daily as prescribed for your pain.  You can alternate with acetaminophen as prescribed over-the-counter, as needed for breakthrough pain.  You can also continue using Orajel as needed.  Please follow-up with your dentist as soon as possible for further evaluation and treatment.  Please return the emergency department if you develop any new or worsening symptoms including persistent fever over 100.4, lockjaw, significant swelling or mass inside your mouth, or any other new or concerning symptoms.

## 2019-04-15 NOTE — ED Provider Notes (Signed)
[facility] EMERGENCY DEPARTMENT Provider Note   CSN: [id number] Arrival date & time: 04/15/19  1237     History   Chief Complaint Chief Complaint  Patient presents with  . Dental Pain    HPI Susan Whitaker is a 48 y.o. female with history of hypertension,, diabetes, hyper aldosteronism who presents with dental pain that began 2 days ago after having a deep cleaning to her teeth.  She reports pain worse on the right side where they cleaned.  It is swollen and painful.  She denies any fevers.  She has soreness in her jaw.  She has been taking Tylenol and Orajel without relief.     HPI  Past Medical History:  Diagnosis Date  . Allergy    allergic rhinitis  . Breast discharge    left milky spontaneous x 1 month with pain  . Diabetes mellitus    type II  . FHx: hypertension   . Gastric ulcer   . GERD (gastroesophageal reflux disease)   . H/O constipation   . H/O hemorrhoids   . H/O varicella   . Hyperlipidemia   . Hypertension   . Hypokalemia   . Increased BMI 06/2010  . Primary hyperaldosteronism ([place]) 02/21/2012  . SVT (supraventricular tachycardia) ([place])    Noted 11/2011 admission  . Thrombocytopenia ([name])   . Thyroid fullness 08/2005  . Urinary incontinence     Patient Active Problem List   Diagnosis Date Noted  . Anxiety state 07/14/2012  . Primary hyperaldosteronism ([place]) 02/21/2012  . Microalbuminuria 02/15/2012  . Multiple thyroid nodules 01/04/2012  . SVT (supraventricular tachycardia) ([place]) 12/29/2011  . Hypokalemia 11/28/2011  . Cervical pain (neck) 05/28/2011  . EXTERNAL HEMORRHOIDS WITHOUT MENTION COMP 02/07/2009  . Diabetes type 2, controlled ([place]) 02/07/2009  . ALLERGIC RHINITIS 05/24/2008  . Hyperlipemia 04/22/2008  . THROMBOCYTOSIS 04/22/2008  . URINARY INCONTINENCE 04/22/2008  . Essential hypertension 01/04/2007  . GERD 01/04/2007    Past Surgical History:  Procedure Laterality Date  . ABDOMINAL SURGERY     91 months of age--  unsure of type of surgery  . COLONOSCOPY  04/2016   hemorrhoids--normal per pt with Dr Collene Mares  . HEMORRHOID SURGERY  2005/2006  . UTERINE FIBROID EMBOLIZATION       OB History    Gravida  4   Para  2   Term  2   Preterm      AB  1   Living  2     SAB  1   TAB      Ectopic      Multiple      Live Births               Home Medications    Prior to Admission medications   Medication Sig Start Date End Date Taking? Authorizing Provider  AMBULATORY NON FORMULARY MEDICATION BORIC ACID CAPSULE 600mg . Insert 1 capsule vaginally two times a week. Patient taking differently: Place 600 mg vaginally See admin instructions. Boric acid capsules- 600 mg each: Insert 1 capsule (600 mg) per vagina two times a week as needed/as directed for yeast infections 07/13/16   Debbrah Alar, NP  amLODipine (NORVASC) 10 MG tablet Take 1 tablet (10 mg total) by mouth daily. 01/05/19   Debbrah Alar, NP  aspirin EC 81 MG tablet Take 1 tablet (81 mg total) by mouth daily. 08/11/15   Debbrah Alar, NP  atorvastatin (LIPITOR) 20 MG tablet Take 1 tablet (20 mg total) by mouth  daily. 01/05/19   Debbrah Alar, NP  canagliflozin (INVOKANA) 300 MG TABS tablet TAKE 1 TABLET BY MOUTH ONCE DAILY BEFORE BREAKFAST 12/11/18   Elayne Snare, MD  Dulaglutide (TRULICITY) 1.5 0000000 SOPN Inject 1.5 mg weekly 04/09/19   Elayne Snare, MD  glucose blood (ACCU-CHEK GUIDE) test strip 1 each by Other route 3 (three) times daily. Use as instructed to check once daily. 11/10/17   Elayne Snare, MD  losartan (COZAAR) 100 MG tablet Take 1 tablet (100 mg total) by mouth daily. 01/05/19   Debbrah Alar, NP  metoprolol tartrate (LOPRESSOR) 100 MG tablet Take 1 tablet (100 mg total) by mouth 2 (two) times daily. 01/05/19   Debbrah Alar, NP  naproxen (NAPROSYN) 500 MG tablet Take 1 tablet (500 mg total) by mouth 2 (two) times daily. 04/15/19   Donneisha Beane, [name], PA-C  ondansetron (ZOFRAN) 4 MG tablet Take  1 tablet (4 mg total) by mouth every 8 (eight) hours as needed for nausea or vomiting. 07/25/18   Debbrah Alar, NP  [facility] DELICA LANCETS FINE MISC Use to check blood sugar 3 times daily 09/28/16   Elayne Snare, MD  penicillin v potassium (VEETID) 500 MG tablet Take 1 tablet (500 mg total) by mouth 4 (four) times daily for 7 days. 04/15/19 04/22/19  Frederica Kuster, PA-C  Polyethyl Glycol-Propyl Glycol (SYSTANE PRESERVATIVE FREE OP) Place 2 drops into both eyes 4 (four) times daily.    [provider]  spironolactone (ALDACTONE) 100 MG tablet Take 1 tablet (100 mg total) by mouth daily. 01/05/19   Debbrah Alar, NP    Family History Family History  Problem Relation Age of Onset  . Cancer Mother        breast  . Stroke Mother   . Cancer Maternal Grandmother        breast  . Hypertension Maternal Grandmother   . Cancer Other        breast, lung  . Hypertension Other   . Stroke Other   . Vision loss Father        some  . Heart disease Father        Rhythm disturbance  . Hypertension Sister   . Hypertension Brother   . Hypertension Brother   . Diabetes Neg Hx     Social History Social History   Tobacco Use  . Smoking status: Never Smoker  . Smokeless tobacco: Never Used  Substance Use Topics  . Alcohol use: No  . Drug use: No     Allergies   Lisinopril   Review of Systems Review of Systems  Constitutional: Negative for fever.  HENT: Positive for dental problem.      Physical Exam Updated Vital Signs BP (!) 155/93 (BP Location: Right Arm)   Pulse 86   Temp 98.5 F (36.9 C) (Oral)   Resp 18   Ht 5\' 2"  (1.575 m)   Wt 90.7 kg   LMP 03/21/2019   SpO2 100%   BMI 36.58 kg/m   Physical Exam Vitals signs and nursing note reviewed.  Constitutional:      General: She is not in acute distress.    Appearance: She is well-developed. She is not diaphoretic.  HENT:     Head: Normocephalic and atraumatic.     Mouth/Throat:     Pharynx: No  oropharyngeal exudate.      Comments: No significant abnormalities noted to the teeth, very mild gingival swelling, no abscess noted; several teeth missing Eyes:  General: No scleral icterus.       Right eye: No discharge.        Left eye: No discharge.     Conjunctiva/sclera: Conjunctivae normal.     Pupils: Pupils are equal, round, and reactive to light.  Neck:     Musculoskeletal: Normal range of motion and neck supple.     Thyroid: No thyromegaly.  Cardiovascular:     Rate and Rhythm: Normal rate and regular rhythm.     Heart sounds: Normal heart sounds. No murmur. No friction rub. No gallop.   Pulmonary:     Effort: Pulmonary effort is normal. No respiratory distress.     Breath sounds: Normal breath sounds. No stridor. No wheezing or rales.  Abdominal:     General: Bowel sounds are normal. There is no distension.     Palpations: Abdomen is soft.     Tenderness: There is no abdominal tenderness. There is no guarding or rebound.  Lymphadenopathy:     Cervical: No cervical adenopathy.  Skin:    General: Skin is warm and dry.     Coloration: Skin is not pale.     Findings: No rash.  Neurological:     Mental Status: She is alert.     Coordination: Coordination normal.      ED Treatments / Results  Labs (all labs ordered are listed, but only abnormal results are displayed) Labs Reviewed - No data to display  EKG None  Radiology No results found.  Procedures Procedures (including critical care time)  Medications Ordered in ED Medications  ketorolac (TORADOL) 15 MG/ML injection 15 mg (has no administration in time range)     Initial Impression / Assessment and Plan / ED Course  I have reviewed the triage vital signs and the nursing notes.  Pertinent labs & imaging results that were available during my care of the patient were reviewed by me and considered in my medical decision making (see chart for details).        Patient with dentalgia.  No abscess  requiring immediate incision and drainage.  Exam not concerning for Ludwig's angina or pharyngeal abscess.  Will treat with Naprosyn, Tylenol, penicillin.  Patient given single dose of Toradol in the ED.  Pt instructed to follow-up with dentist as soon as possible.  Patient plans to call tomorrow.  Discussed return precautions.  Patient vitals stable throughout ED course and discharged in satisfactory condition.   Final Clinical Impressions(s) / ED Diagnoses   Final diagnoses:  Pain, dental    ED Discharge Orders         Ordered    naproxen (NAPROSYN) 500 MG tablet  2 times daily     04/15/19 1457    penicillin v potassium (VEETID) 500 MG tablet  4 times daily     04/15/19 [id number] PA-C 04/15/19 1505    Hayden Rasmussen, MD 04/15/19 1731

## 2019-04-15 NOTE — ED Triage Notes (Signed)
Patient states that she is due to have a deep cleaning on her teeth - she woke up Friday and her right side of her mouth is swollen and painful

## 2019-04-20 ENCOUNTER — Other Ambulatory Visit: Payer: Self-pay | Admitting: Family

## 2019-04-20 ENCOUNTER — Telehealth: Payer: Self-pay

## 2019-04-20 ENCOUNTER — Encounter: Payer: Self-pay | Admitting: Family

## 2019-04-20 NOTE — Telephone Encounter (Signed)
Copied from [place] [id number]. Topic: General - Inquiry >> Apr 20, 2019  3:58 PM Reyne Dumas L wrote: Reason for CRM:   Pt wants medication for yeast infection.  States that because it has been Rxd to her in the past she shouldn't need an appointment and wants to speak with a nurse about this.

## 2019-04-23 ENCOUNTER — Other Ambulatory Visit: Payer: Self-pay

## 2019-04-23 MED ORDER — AMBULATORY NON FORMULARY MEDICATION: 5 refills | Status: DC

## 2019-04-23 MED ORDER — BORIC ACID POWD: 5 refills | Status: DC

## 2019-04-23 MED ORDER — FLUCONAZOLE 150 MG PO TABS: ORAL_TABLET | ORAL | 0 refills | Status: DC

## 2019-04-23 NOTE — Telephone Encounter (Signed)
rx sent today

## 2019-05-12 ENCOUNTER — Other Ambulatory Visit: Payer: Self-pay | Admitting: Endocrinology

## 2019-05-15 ENCOUNTER — Other Ambulatory Visit: Payer: Self-pay

## 2019-05-15 MED ORDER — CANAGLIFLOZIN 300 MG PO TABS: 300.0000 mg | ORAL_TABLET | Freq: Every day | ORAL | 2 refills | Status: DC

## 2019-05-26 DIAGNOSIS — E559 Vitamin D deficiency, unspecified: Secondary | ICD-10-CM | POA: Insufficient documentation

## 2019-06-02 ENCOUNTER — Inpatient Hospital Stay (HOSPITAL_BASED_OUTPATIENT_CLINIC_OR_DEPARTMENT_OTHER)
Admission: EM | Admit: 2019-06-02 | Discharge: 2019-06-11 | DRG: 337 | Disposition: A | Discharge status: Home / Self Care | Payer: 59 | Attending: Physician Assistant | Admitting: Physician Assistant

## 2019-06-02 ENCOUNTER — Other Ambulatory Visit: Payer: Self-pay

## 2019-06-02 ENCOUNTER — Inpatient Hospital Stay (HOSPITAL_COMMUNITY): Payer: 59

## 2019-06-02 ENCOUNTER — Encounter (HOSPITAL_BASED_OUTPATIENT_CLINIC_OR_DEPARTMENT_OTHER): Payer: Self-pay | Admitting: Emergency Medicine

## 2019-06-02 ENCOUNTER — Emergency Department (HOSPITAL_BASED_OUTPATIENT_CLINIC_OR_DEPARTMENT_OTHER): Payer: 59

## 2019-06-02 DIAGNOSIS — Z803 Family history of malignant neoplasm of breast: Secondary | ICD-10-CM | POA: Diagnosis not present

## 2019-06-02 DIAGNOSIS — Z0189 Encounter for other specified special examinations: Secondary | ICD-10-CM

## 2019-06-02 DIAGNOSIS — E785 Hyperlipidemia, unspecified: Secondary | ICD-10-CM | POA: Diagnosis present

## 2019-06-02 DIAGNOSIS — E11649 Type 2 diabetes mellitus with hypoglycemia without coma: Secondary | ICD-10-CM | POA: Diagnosis not present

## 2019-06-02 DIAGNOSIS — Z20822 Contact with and (suspected) exposure to covid-19: Secondary | ICD-10-CM | POA: Diagnosis present

## 2019-06-02 DIAGNOSIS — K219 Gastro-esophageal reflux disease without esophagitis: Secondary | ICD-10-CM | POA: Diagnosis present

## 2019-06-02 DIAGNOSIS — E876 Hypokalemia: Secondary | ICD-10-CM | POA: Diagnosis not present

## 2019-06-02 DIAGNOSIS — I10 Essential (primary) hypertension: Secondary | ICD-10-CM | POA: Diagnosis present

## 2019-06-02 DIAGNOSIS — Z8719 Personal history of other diseases of the digestive system: Secondary | ICD-10-CM

## 2019-06-02 DIAGNOSIS — K567 Ileus, unspecified: Secondary | ICD-10-CM | POA: Diagnosis not present

## 2019-06-02 DIAGNOSIS — K565 Intestinal adhesions [bands], unspecified as to partial versus complete obstruction: Secondary | ICD-10-CM | POA: Diagnosis not present

## 2019-06-02 DIAGNOSIS — Z8249 Family history of ischemic heart disease and other diseases of the circulatory system: Secondary | ICD-10-CM | POA: Diagnosis not present

## 2019-06-02 DIAGNOSIS — K56609 Unspecified intestinal obstruction, unspecified as to partial versus complete obstruction: Secondary | ICD-10-CM

## 2019-06-02 DIAGNOSIS — Z823 Family history of stroke: Secondary | ICD-10-CM

## 2019-06-02 HISTORY — DX: Personal history of other diseases of the digestive system: Z87.19

## 2019-06-02 LAB — CBC WITH DIFFERENTIAL/PLATELET
Abs Immature Granulocytes: 0.02 10*3/uL (ref 0.00–0.07)
Basophils Absolute: 0 10*3/uL (ref 0.0–0.1)
Basophils Relative: 0 %
Eosinophils Absolute: 0.2 10*3/uL (ref 0.0–0.5)
Eosinophils Relative: 2 %
HCT: 42.2 % (ref 36.0–46.0)
Hemoglobin: 13.7 g/dL (ref 12.0–15.0)
Immature Granulocytes: 0 %
Lymphocytes Relative: 53 %
Lymphs Abs: 5.3 10*3/uL — ABNORMAL HIGH (ref 0.7–4.0)
MCH: 29.8 pg (ref 26.0–34.0)
MCHC: 32.5 g/dL (ref 30.0–36.0)
MCV: 91.7 fL (ref 80.0–100.0)
Monocytes Absolute: 0.8 10*3/uL (ref 0.1–1.0)
Monocytes Relative: 8 %
Neutro Abs: 3.6 10*3/uL (ref 1.7–7.7)
Neutrophils Relative %: 37 %
Platelets: 430 10*3/uL — ABNORMAL HIGH (ref 150–400)
RBC: 4.6 MIL/uL (ref 3.87–5.11)
RDW: 13 % (ref 11.5–15.5)
Smear Review: NORMAL
WBC Morphology: ABNORMAL
WBC: 10 10*3/uL (ref 4.0–10.5)
nRBC: 0 % (ref 0.0–0.2)

## 2019-06-02 LAB — URINALYSIS, ROUTINE W REFLEX MICROSCOPIC
Bilirubin Urine: NEGATIVE
Glucose, UA: >=500 mg/dL — AB
Hgb urine dipstick: NEGATIVE
Ketones, ur: NEGATIVE mg/dL
Leukocytes,Ua: NEGATIVE
Nitrite: NEGATIVE
Protein, ur: NEGATIVE mg/dL
Specific Gravity, Urine: 1.02 (ref 1.005–1.030)
pH: 7.5 (ref 5.0–8.0)

## 2019-06-02 LAB — RESPIRATORY PANEL BY RT PCR (FLU A&B, COVID)
Influenza A by PCR: NEGATIVE
Influenza B by PCR: NEGATIVE
SARS Coronavirus 2 by RT PCR: NEGATIVE

## 2019-06-02 LAB — COMPREHENSIVE METABOLIC PANEL
ALT: 15 U/L (ref 0–44)
AST: 17 U/L (ref 15–41)
Albumin: 4 g/dL (ref 3.5–5.0)
Alkaline Phosphatase: 43 U/L (ref 38–126)
Anion gap: 10 (ref 5–15)
BUN: 22 mg/dL — ABNORMAL HIGH (ref 6–20)
CO2: 26 mmol/L (ref 22–32)
Calcium: 9.1 mg/dL (ref 8.9–10.3)
Chloride: 99 mmol/L (ref 98–111)
Creatinine, Ser: 0.96 mg/dL (ref 0.44–1.00)
GFR calc Af Amer: >60 mL/min (ref >60)
GFR calc non Af Amer: >60 mL/min (ref >60)
Glucose, Bld: 140 mg/dL — ABNORMAL HIGH (ref 70–99)
Potassium: 4 mmol/L (ref 3.5–5.1)
Sodium: 135 mmol/L (ref 135–145)
Total Bilirubin: 0.5 mg/dL (ref 0.3–1.2)
Total Protein: 8.2 g/dL — ABNORMAL HIGH (ref 6.5–8.1)

## 2019-06-02 LAB — HCG, SERUM, QUALITATIVE: Preg, Serum: NEGATIVE

## 2019-06-02 LAB — URINALYSIS, MICROSCOPIC (REFLEX)

## 2019-06-02 LAB — HEMOGLOBIN A1C
Hgb A1c MFr Bld: 5.6 % (ref 4.8–5.6)
Mean Plasma Glucose: 114.02 mg/dL

## 2019-06-02 LAB — LIPASE, BLOOD: Lipase: 30 U/L (ref 11–51)

## 2019-06-02 LAB — GLUCOSE, CAPILLARY
Glucose-Capillary: 154 mg/dL — ABNORMAL HIGH (ref 70–99)
Glucose-Capillary: 136 mg/dL — ABNORMAL HIGH (ref 70–99)

## 2019-06-02 LAB — CBG MONITORING, ED: Glucose-Capillary: 152 mg/dL — ABNORMAL HIGH (ref 70–99)

## 2019-06-02 LAB — HIV ANTIBODY (ROUTINE TESTING W REFLEX): HIV Screen 4th Generation wRfx: NONREACTIVE

## 2019-06-02 MED ORDER — FENTANYL CITRATE (PF) 100 MCG/2ML IJ SOLN
100.0000 ug | Freq: Once | INTRAMUSCULAR | Status: AC
  Administered 2019-06-02: 06:00:00 100 ug via INTRAVENOUS
  Filled 2019-06-02: qty 2

## 2019-06-02 MED ORDER — MORPHINE SULFATE (PF) 4 MG/ML IV SOLN
4.0000 mg | Freq: Once | INTRAVENOUS | Status: AC
  Administered 2019-06-02: 08:00:00 4 mg via INTRAVENOUS

## 2019-06-02 MED ORDER — ONDANSETRON HCL 4 MG/2ML IJ SOLN
4.0000 mg | Freq: Four times a day (QID) | INTRAMUSCULAR | Status: DC | PRN
  Administered 2019-06-02 – 2019-06-08 (×3): 4 mg via INTRAVENOUS
  Filled 2019-06-02 (×4): qty 2

## 2019-06-02 MED ORDER — PANTOPRAZOLE SODIUM 40 MG IV SOLR
40.0000 mg | Freq: Every day | INTRAVENOUS | Status: DC
  Administered 2019-06-02 – 2019-06-06 (×5): 40 mg via INTRAVENOUS
  Filled 2019-06-02 (×5): qty 40

## 2019-06-02 MED ORDER — FENTANYL CITRATE (PF) 100 MCG/2ML IJ SOLN
25.0000 ug | INTRAMUSCULAR | Status: DC | PRN
  Administered 2019-06-02 – 2019-06-04 (×23): 50 ug via INTRAVENOUS
  Filled 2019-06-02 (×23): qty 2

## 2019-06-02 MED ORDER — SODIUM CHLORIDE 0.9 % IV SOLN: Freq: Once | INTRAVENOUS | Status: AC

## 2019-06-02 MED ORDER — DIATRIZOATE MEGLUMINE & SODIUM 66-10 % PO SOLN
90.0000 mL | Freq: Once | ORAL | Status: AC
  Administered 2019-06-02: 90 mL via NASOGASTRIC
  Filled 2019-06-02: qty 90

## 2019-06-02 MED ORDER — LIDOCAINE HCL URETHRAL/MUCOSAL 2 % EX GEL: 1.0000 "application " | Freq: Once | CUTANEOUS | Status: DC

## 2019-06-02 MED ORDER — MORPHINE SULFATE (PF) 2 MG/ML IV SOLN
1.0000 mg | INTRAVENOUS | Status: DC | PRN
  Administered 2019-06-02: 14:00:00 2 mg via INTRAVENOUS
  Filled 2019-06-02: qty 1

## 2019-06-02 MED ORDER — PROCHLORPERAZINE EDISYLATE 10 MG/2ML IJ SOLN
10.0000 mg | Freq: Once | INTRAMUSCULAR | Status: AC
  Administered 2019-06-02: 10 mg via INTRAVENOUS
  Filled 2019-06-02: qty 2

## 2019-06-02 MED ORDER — INSULIN ASPART 100 UNIT/ML ~~LOC~~ SOLN
0.0000 [IU] | Freq: Three times a day (TID) | SUBCUTANEOUS | Status: DC
  Administered 2019-06-02: 19:00:00 2 [IU] via SUBCUTANEOUS
  Administered 2019-06-03: 13:00:00 1 [IU] via SUBCUTANEOUS
  Administered 2019-06-09: 17:00:00 2 [IU] via SUBCUTANEOUS
  Administered 2019-06-09: 12:00:00 1 [IU] via SUBCUTANEOUS

## 2019-06-02 MED ORDER — ONDANSETRON HCL 4 MG/2ML IJ SOLN
4.0000 mg | Freq: Once | INTRAMUSCULAR | Status: AC
  Administered 2019-06-02: 4 mg via INTRAVENOUS
  Filled 2019-06-02: qty 2

## 2019-06-02 MED ORDER — IOHEXOL 300 MG/ML  SOLN
100.0000 mL | Freq: Once | INTRAMUSCULAR | Status: AC | PRN
  Administered 2019-06-02: 100 mL via INTRAVENOUS

## 2019-06-02 MED ORDER — POTASSIUM CHLORIDE IN NACL 20-0.9 MEQ/L-% IV SOLN
INTRAVENOUS | Status: DC
  Filled 2019-06-02 (×4): qty 1000

## 2019-06-02 MED ORDER — FENTANYL CITRATE (PF) 100 MCG/2ML IJ SOLN
50.0000 ug | Freq: Once | INTRAMUSCULAR | Status: AC
  Administered 2019-06-02: 11:00:00 50 ug via INTRAVENOUS
  Filled 2019-06-02: qty 2

## 2019-06-02 MED ORDER — MORPHINE SULFATE (PF) 4 MG/ML IV SOLN
INTRAVENOUS | Status: AC
  Filled 2019-06-02: qty 1

## 2019-06-02 MED ORDER — ONDANSETRON 4 MG PO TBDP: 4.0000 mg | ORAL_TABLET | Freq: Four times a day (QID) | ORAL | Status: DC | PRN

## 2019-06-02 MED ORDER — FENTANYL CITRATE (PF) 100 MCG/2ML IJ SOLN
100.0000 ug | Freq: Once | INTRAMUSCULAR | Status: AC
  Administered 2019-06-02: 07:00:00 100 ug via INTRAVENOUS
  Filled 2019-06-02: qty 2

## 2019-06-02 NOTE — ED Notes (Signed)
Attempt to call report to [facility], nurse not available. Message left to return call.  [facility] ETA [id number]

## 2019-06-02 NOTE — ED Notes (Signed)
Pt vomited large amount undigested food, clothing, bedding changed, personal care provided.

## 2019-06-02 NOTE — ED Triage Notes (Signed)
Pt states she woke up around 3 am with abd pain  Pt states she has had one episode of vomiting and one solid stool without relief  Pt states she felt fine when went to bed  Pt states she had [place] food, steak and shrimp for supper last night

## 2019-06-02 NOTE — ED Provider Notes (Signed)
Assumed care of patient at 7 AM.  Patient with abdominal pain, nausea, vomiting.  Lab work unremarkable.  Awaiting CT scan abdomen pelvis.  Radiology called and states that patient appears to have a bowel obstruction likely from an internal hernia versus closed-loop obstruction.  Talked with Dr. Marlou Starks with surgery who will admit to [facility].  NG tube to be placed.  Coronavirus screening labs ordered.  Patient given additional dose of fentanyl.  Maintenance fluids ordered.  This chart was dictated using voice recognition software.  Despite best efforts to proofread,  errors can occur which can change the documentation meaning.     Lennice Sites, DO 06/02/19 1046

## 2019-06-02 NOTE — ED Provider Notes (Signed)
[place] DEPT [facility] Provider Note: Georgena Spurling, MD, FACEP  CSN: [id number] MRN: [id number] ARRIVAL: 06/02/19 at [place]: [place]  Abdominal Pain   HISTORY OF PRESENT ILLNESS  06/02/19 5:19 AM Susan Whitaker is a 49 y.o. female who was awakened with severe abdominal pain about 3 AM today.  The pain is primarily in the left upper quadrant but is also diffuse.  She describes it as sharp and rates it as a 10 out of 10.  Is worse with movement or palpation.  She has had one episode of vomiting and 1 solid bowel movement.  These did not relieve her discomfort.  She denies shortness of breath, fever or chills. She ate at a [place] yesterday evening.    Past Medical History:  Diagnosis Date  . Allergy    allergic rhinitis  . Breast discharge    left milky spontaneous x 1 month with pain  . Diabetes mellitus    type II  . FHx: hypertension   . Gastric ulcer   . GERD (gastroesophageal reflux disease)   . H/O constipation   . H/O hemorrhoids   . H/O varicella   . Hyperlipidemia   . Hypertension   . Hypokalemia   . Increased BMI 06/2010  . Primary hyperaldosteronism ([place]) 02/21/2012  . SVT (supraventricular tachycardia) ([place])    Noted 11/2011 admission  . Thrombocytopenia ([place])   . Thyroid fullness 08/2005  . Urinary incontinence     Past Surgical History:  Procedure Laterality Date  . ABDOMINAL SURGERY     78 months of age-- unsure of type of surgery  . COLONOSCOPY  04/2016   hemorrhoids--normal per pt with Dr Collene Mares  . HEMORRHOID SURGERY  2005/2006  . LAPAROTOMY N/A 06/03/2019   Procedure: EXPLORATORY LAPAROTOMY WITH LYSIS OF ADHESIONS;  Surgeon: Jovita Kussmaul, MD;  Location: [facility] ORS;  Service: General;  Laterality: N/A;  . UTERINE FIBROID EMBOLIZATION      Family History  Problem Relation Age of Onset  . Cancer Mother        breast  . Stroke Mother   . Cancer Maternal Grandmother        breast  . Hypertension Maternal  Grandmother   . Cancer Other        breast, lung  . Hypertension Other   . Stroke Other   . Vision loss Father        some  . Heart disease Father        Rhythm disturbance  . Hypertension Sister   . Hypertension Brother   . Hypertension Brother   . Diabetes Neg Hx     Social History   Tobacco Use  . Smoking status: Never Smoker  . Smokeless tobacco: Never Used  Substance Use Topics  . Alcohol use: No  . Drug use: No    Prior to Admission medications   Medication Sig Start Date End Date Taking? Authorizing Provider  amLODipine (NORVASC) 10 MG tablet Take 1 tablet (10 mg total) by mouth daily. 01/05/19  Yes Debbrah Alar, NP  aspirin EC 81 MG tablet Take 1 tablet (81 mg total) by mouth daily. 08/11/15  Yes Debbrah Alar, NP  atorvastatin (LIPITOR) 20 MG tablet Take 1 tablet (20 mg total) by mouth daily. Patient taking differently: Take 20 mg by mouth at bedtime.  01/05/19  Yes Debbrah Alar, NP  Boric Acid POWD 600mg  in capsule inserted vaginally once nightly as needed. Patient taking differently:  Place 600 mg vaginally at bedtime as needed (yeast infection, vaginal sx). 600mg  in capsule inserted vaginally once nightly as needed 04/23/19  Yes Debbrah Alar, NP  canagliflozin (INVOKANA) 300 MG TABS tablet Take 1 tablet (300 mg total) by mouth daily before breakfast. 05/15/19  Yes Elayne Snare, MD  Dulaglutide (TRULICITY) 1.5 0000000 SOPN Inject 1.5 mg weekly 04/09/19  Yes Elayne Snare, MD  fluconazole (DIFLUCAN) 150 MG tablet TAKE ONE TABLET BY MOUTH AS A ONE-TIME DOSE MAY  REPEAT  IN  3  DAYS  IF  SYMPTOMS  NOT  IMPROVED Patient taking differently: Take 150 mg by mouth See admin instructions. Take 1 tablet (150 mg) PO once PRN - may repeat in 3 days if sx not improved. 04/23/19  Yes Debbrah Alar, NP  losartan (COZAAR) 100 MG tablet Take 1 tablet (100 mg total) by mouth daily. 01/05/19  Yes Debbrah Alar, NP  metoprolol tartrate (LOPRESSOR) 100 MG  tablet Take 1 tablet (100 mg total) by mouth 2 (two) times daily. 01/05/19  Yes Debbrah Alar, NP  naproxen (NAPROSYN) 375 MG tablet Take 1 tablet (375 mg total) by mouth 2 (two) times daily. Patient taking differently: Take 375 mg by mouth 2 (two) times daily as needed for mild pain or moderate pain.  04/15/19  Yes Law, [place], PA-C  ondansetron (ZOFRAN) 4 MG tablet Take 1 tablet (4 mg total) by mouth every 8 (eight) hours as needed for nausea or vomiting. 07/25/18  Yes Debbrah Alar, NP  Polyethyl Glycol-Propyl Glycol (SYSTANE PRESERVATIVE FREE OP) Place 2 drops into both eyes 4 (four) times daily as needed (irritation, dryness).    Yes [provider]  spironolactone (ALDACTONE) 100 MG tablet Take 1 tablet (100 mg total) by mouth daily. 01/05/19  Yes Debbrah Alar, NP  Vitamin D, Ergocalciferol, (DRISDOL) 1.25 MG (50000 UNIT) CAPS capsule Take 50,000 Units by mouth once a week. 05/27/19  Yes [provider]  AMBULATORY NON FORMULARY MEDICATION BORIC ACID CAPSULE 600mg . Insert 1 capsule vaginally two times a week. Patient not taking: Reported on 06/02/2019 04/23/19   Debbrah Alar, NP  glucose blood (ACCU-CHEK GUIDE) test strip 1 each by Other route 3 (three) times daily. Use as instructed to check once daily. 11/10/17   Elayne Snare, MD  [facility] DELICA LANCETS FINE MISC Use to check blood sugar 3 times daily 09/28/16   Elayne Snare, MD    Allergies Lisinopril   REVIEW OF SYSTEMS  Negative except as noted here or in the History of Present Illness.   PHYSICAL EXAMINATION  Initial Vital Signs Blood pressure 133/78, pulse 81, temperature (!) 97.5 F (36.4 C), resp. rate 18, height 5\' 3"  (1.6 m), weight 90.7 kg, last menstrual period 05/18/2019, SpO2 99 %.  Examination General: Well-developed, well-nourished female in no acute distress; appearance consistent with age of record HENT: normocephalic; atraumatic Eyes: pupils equal, round and reactive to  light; extraocular muscles intact Neck: supple Heart: regular rate and rhythm Lungs: clear to auscultation bilaterally Abdomen: soft; nondistended; diffusely tender, most prominent in the left upper quadrant in the right upper quadrant; bowel sounds present Extremities: No deformity; full range of motion; pulses normal Neurologic: Awake, alert and oriented; motor function intact in all extremities and symmetric; no facial droop Skin: Warm and dry Psychiatric: Grimacing; moaning   RESULTS  Summary of this visit's results, reviewed and interpreted by myself:   EKG Interpretation  Date/Time:    Ventricular Rate:    PR Interval:    QRS Duration:  QT Interval:    QTC Calculation:   R Axis:     Text Interpretation:        Laboratory Studies: Results for orders placed or performed during the hospital encounter of 06/02/19 (from the past 24 hour(s))  CBC with Differential     Status: Abnormal   Collection Time: 06/02/19  5:29 AM  Result Value Ref Range   WBC 10.0 4.0 - 10.5 K/uL   RBC 4.60 3.87 - 5.11 MIL/uL   Hemoglobin 13.7 12.0 - 15.0 g/dL   HCT 42.2 36.0 - 46.0 %   MCV 91.7 80.0 - 100.0 fL   MCH 29.8 26.0 - 34.0 pg   MCHC 32.5 30.0 - 36.0 g/dL   RDW 13.0 11.5 - 15.5 %   Platelets 430 (H) 150 - 400 K/uL   nRBC 0.0 0.0 - 0.2 %   Neutrophils Relative % 37 %   Neutro Abs 3.6 1.7 - 7.7 K/uL   Lymphocytes Relative 53 %   Lymphs Abs 5.3 (H) 0.7 - 4.0 K/uL   Monocytes Relative 8 %   Monocytes Absolute 0.8 0.1 - 1.0 K/uL   Eosinophils Relative 2 %   Eosinophils Absolute 0.2 0.0 - 0.5 K/uL   Basophils Relative 0 %   Basophils Absolute 0.0 0.0 - 0.1 K/uL   WBC Morphology Abnormal lymphocytes present    RBC Morphology MORPHOLOGY UNREMARKABLE    Smear Review Normal platelet morphology    Immature Granulocytes 0 %   Abs Immature Granulocytes 0.02 0.00 - 0.07 K/uL  Comprehensive metabolic panel     Status: Abnormal   Collection Time: 06/02/19  5:29 AM  Result Value Ref Range     Sodium 135 135 - 145 mmol/L   Potassium 4.0 3.5 - 5.1 mmol/L   Chloride 99 98 - 111 mmol/L   CO2 26 22 - 32 mmol/L   Glucose, Bld 140 (H) 70 - 99 mg/dL   BUN 22 (H) 6 - 20 mg/dL   Creatinine, Ser 0.96 0.44 - 1.00 mg/dL   Calcium 9.1 8.9 - 10.3 mg/dL   Total Protein 8.2 (H) 6.5 - 8.1 g/dL   Albumin 4.0 3.5 - 5.0 g/dL   AST 17 15 - 41 U/L   ALT 15 0 - 44 U/L   Alkaline Phosphatase 43 38 - 126 U/L   Total Bilirubin 0.5 0.3 - 1.2 mg/dL   GFR calc non Af Amer >60 >60 mL/min   GFR calc Af Amer >60 >60 mL/min   Anion gap 10 5 - 15  Lipase, blood     Status: None   Collection Time: 06/02/19  5:29 AM  Result Value Ref Range   Lipase 30 11 - 51 U/L   Imaging Studies: No results found.  ED COURSE and MDM  Nursing notes, initial and subsequent vitals signs, including pulse oximetry, reviewed and interpreted by myself.  Vitals:   06/05/19 1005 06/05/19 1401 06/05/19 1415 06/05/19 2126  BP: (!) 148/84 (!) 189/93 (!) 152/75 (!) 154/79  Pulse: 88 81 82 77  Resp: 16 18  17   Temp: 99.2 F (37.3 C) 99.6 F (37.6 C)  97.8 F (36.6 C)  TempSrc: Oral Oral  Oral  SpO2: 95% 96%  95%  Weight:      Height:       Medications  iohexol (OMNIPAQUE) 300 MG/ML solution 100 mL (has no administration in time range)  0.9 %  sodium chloride infusion ( Intravenous New Bag/Given 06/02/19 0541)  ondansetron (ZOFRAN) injection  4 mg (4 mg Intravenous Given 06/02/19 0541)  fentaNYL (SUBLIMAZE) injection 100 mcg (100 mcg Intravenous Given 06/02/19 0541)  fentaNYL (SUBLIMAZE) injection 100 mcg (100 mcg Intravenous Given 06/02/19 0653)   7:02 AM Signed out to Dr. Ronnald Nian.  CT scan of the abdomen and pelvis pending.   PROCEDURES  Procedures   ED DIAGNOSES     ICD-10-CM   1. Intestinal obstruction, unspecified cause, unspecified whether partial or complete ([place])  K56.609       Shanon Rosser, MD 06/05/19 2237

## 2019-06-02 NOTE — ED Notes (Signed)
Call to Daughter Kathaleen Bury per pt request to update on findings and plan for admission.  Provided inpatient room number and phone number to nurses station at [facility].

## 2019-06-02 NOTE — ED Notes (Signed)
  Patient had a large emesis episode of mostly partially digested food.  Patient was cleaned and full linen change was done.  Patient states she feels better and pain is improving.

## 2019-06-02 NOTE — Progress Notes (Signed)
Patient arrived on unit; a/o x 3; c/o nausea and abdominal discomfort;  Attending paged for prn orders

## 2019-06-02 NOTE — Progress Notes (Signed)
NG tube placed with no difficulty; patient tolerated well; gurgling sounds ausculted over abdomen post insertion when injecting air via syringe. Xray ordered to verify placement.

## 2019-06-02 NOTE — ED Notes (Signed)
Report provided to [facility] for transport. 

## 2019-06-02 NOTE — ED Notes (Signed)
Susan Whitaker [phone]

## 2019-06-02 NOTE — ED Notes (Signed)
Pt resting comfortably at this time.  No active vomiting.

## 2019-06-02 NOTE — ED Notes (Signed)
Report provided to Arde RN at [facility]

## 2019-06-02 NOTE — H&P (Signed)
Susan Whitaker is an 49 y.o. female.   Chief Complaint: Abdominal pain HPI: The patient is a 49 year old black female who presents with abdominal pain that started about 1 AM.  She has had multiple episodes of vomiting.  She did pass flatus at the hospital and had a normal bowel movement this morning.  She denies any fevers or chills.  She had an unknown surgery as a baby on the abdomen.  She came to the emergency department where a CT scan shows a dilated loop of mid small bowel consistent with a possible small bowel obstruction.  Past Medical History:  Diagnosis Date  . Allergy    allergic rhinitis  . Breast discharge    left milky spontaneous x 1 month with pain  . Diabetes mellitus    type II  . FHx: hypertension   . Gastric ulcer   . GERD (gastroesophageal reflux disease)   . H/O constipation   . H/O hemorrhoids   . H/O varicella   . Hyperlipidemia   . Hypertension   . Hypokalemia   . Increased BMI 06/2010  . Primary hyperaldosteronism ([place]) 02/21/2012  . SVT (supraventricular tachycardia) ([place])    Noted 11/2011 admission  . Thrombocytopenia ([place])   . Thyroid fullness 08/2005  . Urinary incontinence     Past Surgical History:  Procedure Laterality Date  . ABDOMINAL SURGERY     13 months of age-- unsure of type of surgery  . COLONOSCOPY  04/2016   hemorrhoids--normal per pt with Dr Collene Mares  . HEMORRHOID SURGERY  2005/2006  . UTERINE FIBROID EMBOLIZATION      Family History  Problem Relation Age of Onset  . Cancer Mother        breast  . Stroke Mother   . Cancer Maternal Grandmother        breast  . Hypertension Maternal Grandmother   . Cancer Other        breast, lung  . Hypertension Other   . Stroke Other   . Vision loss Father        some  . Heart disease Father        Rhythm disturbance  . Hypertension Sister   . Hypertension Brother   . Hypertension Brother   . Diabetes Neg Hx    Social History:  reports that she has never smoked. She has never used  smokeless tobacco. She reports that she does not drink alcohol or use drugs.  Allergies:  Allergies  Allergen Reactions  . Lisinopril Cough    Medications Prior to Admission  Medication Sig Dispense Refill  . AMBULATORY NON FORMULARY MEDICATION BORIC ACID CAPSULE 600mg . Insert 1 capsule vaginally two times a week. 8 capsule 5  . amLODipine (NORVASC) 10 MG tablet Take 1 tablet (10 mg total) by mouth daily. 90 tablet 1  . aspirin EC 81 MG tablet Take 1 tablet (81 mg total) by mouth daily.    Marland Kitchen atorvastatin (LIPITOR) 20 MG tablet Take 1 tablet (20 mg total) by mouth daily. 90 tablet 1  . Boric Acid POWD 600mg  in capsule inserted vaginally once nightly as needed. 18 g 5  . canagliflozin (INVOKANA) 300 MG TABS tablet Take 1 tablet (300 mg total) by mouth daily before breakfast. 30 tablet 2  . Dulaglutide (TRULICITY) 1.5 0000000 SOPN Inject 1.5 mg weekly 4 pen 3  . fluconazole (DIFLUCAN) 150 MG tablet TAKE ONE TABLET BY MOUTH AS A ONE-TIME DOSE MAY  REPEAT  IN  3  DAYS  IF  SYMPTOMS  NOT  IMPROVED 2 tablet 0  . fluconazole (DIFLUCAN) 150 MG tablet Take 1 tablet by mouth today, may repeat in 3 days if needed 2 tablet 0  . glucose blood (ACCU-CHEK GUIDE) test strip 1 each by Other route 3 (three) times daily. Use as instructed to check once daily. 100 each 12  . losartan (COZAAR) 100 MG tablet Take 1 tablet (100 mg total) by mouth daily. 90 tablet 1  . metoprolol tartrate (LOPRESSOR) 100 MG tablet Take 1 tablet (100 mg total) by mouth 2 (two) times daily. 180 tablet 1  . naproxen (NAPROSYN) 375 MG tablet Take 1 tablet (375 mg total) by mouth 2 (two) times daily. 12 tablet 0  . ondansetron (ZOFRAN) 4 MG tablet Take 1 tablet (4 mg total) by mouth every 8 (eight) hours as needed for nausea or vomiting. 20 tablet 0  . ONETOUCH DELICA LANCETS FINE MISC Use to check blood sugar 3 times daily 100 each 3  . Polyethyl Glycol-Propyl Glycol (SYSTANE PRESERVATIVE FREE OP) Place 2 drops into both eyes 4 (four)  times daily.    Marland Kitchen spironolactone (ALDACTONE) 100 MG tablet Take 1 tablet (100 mg total) by mouth daily. 90 tablet 1    Results for orders placed or performed during the hospital encounter of 06/02/19 (from the past 48 hour(s))  CBC with Differential     Status: Abnormal   Collection Time: 06/02/19  5:29 AM  Result Value Ref Range   WBC 10.0 4.0 - 10.5 K/uL   RBC 4.60 3.87 - 5.11 MIL/uL   Hemoglobin 13.7 12.0 - 15.0 g/dL   HCT 42.2 36.0 - 46.0 %   MCV 91.7 80.0 - 100.0 fL   MCH 29.8 26.0 - 34.0 pg   MCHC 32.5 30.0 - 36.0 g/dL   RDW 13.0 11.5 - 15.5 %   Platelets 430 (H) 150 - 400 K/uL   nRBC 0.0 0.0 - 0.2 %   Neutrophils Relative % 37 %   Neutro Abs 3.6 1.7 - 7.7 K/uL   Lymphocytes Relative 53 %   Lymphs Abs 5.3 (H) 0.7 - 4.0 K/uL   Monocytes Relative 8 %   Monocytes Absolute 0.8 0.1 - 1.0 K/uL   Eosinophils Relative 2 %   Eosinophils Absolute 0.2 0.0 - 0.5 K/uL   Basophils Relative 0 %   Basophils Absolute 0.0 0.0 - 0.1 K/uL   WBC Morphology Abnormal lymphocytes present    RBC Morphology MORPHOLOGY UNREMARKABLE    Smear Review Normal platelet morphology    Immature Granulocytes 0 %   Abs Immature Granulocytes 0.02 0.00 - 0.07 K/uL    Comment: Performed at [facility], [address] [place], [place] [address]  Comprehensive metabolic panel     Status: Abnormal   Collection Time: 06/02/19  5:29 AM  Result Value Ref Range   Sodium 135 135 - 145 mmol/L   Potassium 4.0 3.5 - 5.1 mmol/L   Chloride 99 98 - 111 mmol/L   CO2 26 22 - 32 mmol/L   Glucose, Bld 140 (H) 70 - 99 mg/dL   BUN 22 (H) 6 - 20 mg/dL   Creatinine, Ser 0.96 0.44 - 1.00 mg/dL   Calcium 9.1 8.9 - 10.3 mg/dL   Total Protein 8.2 (H) 6.5 - 8.1 g/dL   Albumin 4.0 3.5 - 5.0 g/dL   AST 17 15 - 41 U/L   ALT 15 0 - 44 U/L   Alkaline Phosphatase 43 38 -  126 U/L   Total Bilirubin 0.5 0.3 - 1.2 mg/dL   GFR calc non Af Amer >60 >60 mL/min   GFR calc Af Amer >60 >60 mL/min   Anion gap 10 5 - 15     Comment: Performed at [facility], [address] [place], [place] [address]  Lipase, blood     Status: None   Collection Time: 06/02/19  5:29 AM  Result Value Ref Range   Lipase 30 11 - 51 U/L    Comment: Performed at [facility], [address] [place], [place] [address]  hCG, serum, qualitative     Status: None   Collection Time: 06/02/19  7:44 AM  Result Value Ref Range   Preg, Serum NEGATIVE NEGATIVE    Comment:        THE SENSITIVITY OF THIS METHODOLOGY IS >10 mIU/mL. Performed at [facility], [address] [place], [place] [address]   Urinalysis, Routine w reflex microscopic     Status: Abnormal   Collection Time: 06/02/19 10:08 AM  Result Value Ref Range   Color, Urine STRAW (A) YELLOW   APPearance CLEAR CLEAR   Specific Gravity, Urine 1.020 1.005 - 1.030   pH 7.5 5.0 - 8.0   Glucose, UA >=500 (A) NEGATIVE mg/dL   Hgb urine dipstick NEGATIVE NEGATIVE   Bilirubin Urine NEGATIVE NEGATIVE   Ketones, ur NEGATIVE NEGATIVE mg/dL   Protein, ur NEGATIVE NEGATIVE mg/dL   Nitrite NEGATIVE NEGATIVE   Leukocytes,Ua NEGATIVE NEGATIVE    Comment: Performed at [facility], [address] [place], [place] [address]  Urinalysis, Microscopic (reflex)     Status: Abnormal   Collection Time: 06/02/19 10:08 AM  Result Value Ref Range   RBC / HPF 0-5 0 - 5 RBC/hpf   WBC, UA 0-5 0 - 5 WBC/hpf   Bacteria, UA RARE (A) NONE SEEN   Squamous Epithelial / LPF 0-5 0 - 5    Comment: Performed at [facility], [address] [place], [place] [address]  CBG monitoring, ED     Status: Abnormal   Collection Time: 06/02/19 11:36 AM  Result Value Ref Range   Glucose-Capillary 152 (H) 70 - 99 mg/dL   CT ABDOMEN PELVIS W CONTRAST  Result Date: 06/02/2019 CLINICAL DATA:  Abdominal pain with vomiting EXAM: CT ABDOMEN AND PELVIS WITH CONTRAST TECHNIQUE: Multidetector CT imaging of the abdomen and pelvis was performed using the standard  protocol following bolus administration of intravenous contrast. CONTRAST:  186mL OMNIPAQUE IOHEXOL 300 MG/ML  SOLN COMPARISON:  07/27/2018 FINDINGS: Lower chest: Minor dependent basilar hypoventilatory changes. Normal heart size. No pericardial or pleural effusion. Hepatobiliary: No focal liver abnormality is seen. No gallstones, gallbladder wall thickening, or biliary dilatation. Pancreas: Unremarkable. No pancreatic ductal dilatation or surrounding inflammatory changes. Spleen: Normal in size without focal abnormality. Adrenals/Urinary Tract: Adrenal glands are unremarkable. Kidneys are normal, without renal calculi, focal lesion, or hydronephrosis. Bladder is unremarkable. Stomach/Bowel: Within the mid abdomen extending to the umbilical level, there are distended fluid-filled loops of bowel with associated air-fluid levels with slight wall prominence and trace adjacent mesenteric free fluid in the right abdomen. Small bowel is dilated up to 3.7 cm. Appearance compatible with developing small bowel obstruction. Difficult to exclude a closed loop obstruction related to adhesions or internal herniation. No associated pneumatosis free air, fluid collection, abscess, or hematoma. Vascular/Lymphatic: Atherosclerosis of aorta. Negative for aneurysm or dissection. No  occlusive process. Mesenteric and renal vasculature appear patent. No veno-occlusive disease. No bulky adenopathy. Reproductive: Again noted is uterine enlargement related to calcified degenerated fibroids. No other adnexal abnormality or free fluid. No pelvic fluid collection or abscess. Other: No abdominal wall hernia or abnormality. No abdominopelvic ascites. Musculoskeletal: Degenerative changes of the spine. Lumbar facet arthropathy also evident. No acute osseous finding. IMPRESSION: Mid abdominal fluid filled and dilated small bowel with air-fluid levels, wall prominence and adjacent mesenteric free fluid compatible with nonspecific obstruction  pattern. Difficult to exclude a closed loop obstruction related to adhesions or internal herniation. These results were called by telephone at the time of interpretation on 06/02/2019 at 9:42 am to provider Dr Ronnald Nian, who verbally acknowledged these results. Electronically Signed   By: Jerilynn Mages.  Shick M.D.   On: 06/02/2019 09:45    Review of Systems  Constitutional: Negative.   HENT: Negative.   Eyes: Negative.   Respiratory: Negative.   Cardiovascular: Negative.   Gastrointestinal: Positive for abdominal pain, nausea and vomiting.  Endocrine: Negative.   Genitourinary: Negative.   Musculoskeletal: Negative.   Skin: Negative.   Allergic/Immunologic: Negative.   Neurological: Negative.   Hematological: Negative.   Psychiatric/Behavioral: Negative.     Blood pressure 139/72, pulse 100, temperature 98.2 F (36.8 C), temperature source Oral, resp. rate 20, height 5\' 3"  (1.6 m), weight 90.7 kg, last menstrual period 05/18/2019, SpO2 96 %. Physical Exam  Constitutional: She is oriented to person, place, and time. She appears well-developed and well-nourished. No distress.  HENT:  Head: Normocephalic and atraumatic.  Mouth/Throat: No oropharyngeal exudate.  Eyes: Pupils are equal, round, and reactive to light. Conjunctivae and EOM are normal.  Cardiovascular: Normal rate, regular rhythm and normal heart sounds.  Respiratory: Effort normal and breath sounds normal. No stridor. No respiratory distress.  GI: Soft. There is abdominal tenderness.  There is moderate diffuse tenderness with only mild distension. quiet  Musculoskeletal:        General: Normal range of motion.     Cervical back: Normal range of motion and neck supple.  Neurological: She is alert and oriented to person, place, and time. Coordination normal.  Skin: Skin is warm and dry. No rash noted.  Psychiatric: She has a normal mood and affect. Her behavior is normal. Thought content normal.     Assessment/Plan The patient  appears to have a small bowel obstruction which is most likely secondary to scar tissue from previous surgery.  At this point I would recommend placing an NG tube for decompression.  Her white count temperature and vitals are normal.  Once she is adequately decompressed we will begin the small bowel protocol.  If she does not improve over the next day or 2 then she may require exploration.  Autumn Messing III, MD 06/02/2019, 2:16 PM

## 2019-06-02 NOTE — ED Notes (Signed)
Patient transported to CT 

## 2019-06-03 ENCOUNTER — Inpatient Hospital Stay (HOSPITAL_COMMUNITY): Payer: 59

## 2019-06-03 ENCOUNTER — Inpatient Hospital Stay (HOSPITAL_COMMUNITY): Payer: 59 | Admitting: Certified Registered Nurse Anesthetist

## 2019-06-03 ENCOUNTER — Encounter (HOSPITAL_COMMUNITY): Admission: EM | Disposition: A | Discharge status: Home / Self Care | Payer: Self-pay | Source: Home / Self Care

## 2019-06-03 HISTORY — PX: ABDOMINAL ADHESION SURGERY: SHX90

## 2019-06-03 HISTORY — PX: LAPAROTOMY: SHX154

## 2019-06-03 LAB — CBC
HCT: 56.1 % — ABNORMAL HIGH (ref 36.0–46.0)
Hemoglobin: 18.3 g/dL — ABNORMAL HIGH (ref 12.0–15.0)
MCH: 29.4 pg (ref 26.0–34.0)
MCHC: 32.6 g/dL (ref 30.0–36.0)
MCV: 90.2 fL (ref 80.0–100.0)
Platelets: 249 10*3/uL (ref 150–400)
RBC: 6.22 MIL/uL — ABNORMAL HIGH (ref 3.87–5.11)
RDW: 13.3 % (ref 11.5–15.5)
WBC: 17.7 10*3/uL — ABNORMAL HIGH (ref 4.0–10.5)
nRBC: 0 % (ref 0.0–0.2)

## 2019-06-03 LAB — GLUCOSE, CAPILLARY
Glucose-Capillary: 100 mg/dL — ABNORMAL HIGH (ref 70–99)
Glucose-Capillary: 138 mg/dL — ABNORMAL HIGH (ref 70–99)
Glucose-Capillary: 116 mg/dL — ABNORMAL HIGH (ref 70–99)
Glucose-Capillary: 127 mg/dL — ABNORMAL HIGH (ref 70–99)

## 2019-06-03 LAB — BASIC METABOLIC PANEL
Anion gap: 12 (ref 5–15)
BUN: 23 mg/dL — ABNORMAL HIGH (ref 6–20)
CO2: 21 mmol/L — ABNORMAL LOW (ref 22–32)
Calcium: 8.6 mg/dL — ABNORMAL LOW (ref 8.9–10.3)
Chloride: 102 mmol/L (ref 98–111)
Creatinine, Ser: 1 mg/dL (ref 0.44–1.00)
GFR calc Af Amer: >60 mL/min (ref >60)
GFR calc non Af Amer: >60 mL/min (ref >60)
Glucose, Bld: 104 mg/dL — ABNORMAL HIGH (ref 70–99)
Potassium: 5.2 mmol/L — ABNORMAL HIGH (ref 3.5–5.1)
Sodium: 135 mmol/L (ref 135–145)

## 2019-06-03 LAB — SURGICAL PCR SCREEN
MRSA, PCR: NEGATIVE
Staphylococcus aureus: NEGATIVE

## 2019-06-03 SURGERY — LAPAROTOMY, EXPLORATORY
Anesthesia: General | Site: Abdomen

## 2019-06-03 MED ORDER — PHENYLEPHRINE HCL-NACL 10-0.9 MG/250ML-% IV SOLN
INTRAVENOUS | Status: DC | PRN
  Administered 2019-06-03: 50 ug/min via INTRAVENOUS

## 2019-06-03 MED ORDER — LIDOCAINE 2% (20 MG/ML) 5 ML SYRINGE
INTRAMUSCULAR | Status: DC | PRN
  Administered 2019-06-03: 100 mg via INTRAVENOUS

## 2019-06-03 MED ORDER — SODIUM CHLORIDE 0.9 % IV SOLN: INTRAVENOUS | Status: DC | PRN

## 2019-06-03 MED ORDER — SODIUM CHLORIDE 0.9 % IR SOLN
Status: DC | PRN
  Administered 2019-06-03: 2000 mL

## 2019-06-03 MED ORDER — HYDROMORPHONE HCL 1 MG/ML IJ SOLN: 0.2500 mg | INTRAMUSCULAR | Status: DC | PRN

## 2019-06-03 MED ORDER — ONDANSETRON HCL 4 MG/2ML IJ SOLN
INTRAMUSCULAR | Status: DC | PRN
  Administered 2019-06-03: 4 mg via INTRAVENOUS

## 2019-06-03 MED ORDER — PROPOFOL 10 MG/ML IV BOLUS
INTRAVENOUS | Status: DC | PRN
  Administered 2019-06-03: 160 mg via INTRAVENOUS

## 2019-06-03 MED ORDER — SUCCINYLCHOLINE CHLORIDE 200 MG/10ML IV SOSY
PREFILLED_SYRINGE | INTRAVENOUS | Status: AC
  Filled 2019-06-03: qty 10

## 2019-06-03 MED ORDER — PHENYLEPHRINE HCL (PRESSORS) 10 MG/ML IV SOLN
INTRAVENOUS | Status: AC
  Filled 2019-06-03: qty 1

## 2019-06-03 MED ORDER — PHENYLEPHRINE 40 MCG/ML (10ML) SYRINGE FOR IV PUSH (FOR BLOOD PRESSURE SUPPORT)
PREFILLED_SYRINGE | INTRAVENOUS | Status: DC | PRN
  Administered 2019-06-03 (×2): 80 ug via INTRAVENOUS

## 2019-06-03 MED ORDER — ROCURONIUM BROMIDE 10 MG/ML (PF) SYRINGE
PREFILLED_SYRINGE | INTRAVENOUS | Status: AC
  Filled 2019-06-03: qty 10

## 2019-06-03 MED ORDER — SODIUM CHLORIDE 0.9 % IV SOLN: INTRAVENOUS | Status: DC

## 2019-06-03 MED ORDER — MIDAZOLAM HCL 5 MG/5ML IJ SOLN
INTRAMUSCULAR | Status: DC | PRN
  Administered 2019-06-03: 2 mg via INTRAVENOUS

## 2019-06-03 MED ORDER — FENTANYL CITRATE (PF) 250 MCG/5ML IJ SOLN
INTRAMUSCULAR | Status: DC | PRN
  Administered 2019-06-03 (×2): 50 ug via INTRAVENOUS
  Administered 2019-06-03: 100 ug via INTRAVENOUS
  Administered 2019-06-03: 50 ug via INTRAVENOUS

## 2019-06-03 MED ORDER — FENTANYL CITRATE (PF) 250 MCG/5ML IJ SOLN
INTRAMUSCULAR | Status: AC
  Filled 2019-06-03: qty 5

## 2019-06-03 MED ORDER — SUGAMMADEX SODIUM 200 MG/2ML IV SOLN
INTRAVENOUS | Status: DC | PRN
  Administered 2019-06-03: 300 mg via INTRAVENOUS

## 2019-06-03 MED ORDER — KETOROLAC TROMETHAMINE 30 MG/ML IJ SOLN: 30.0000 mg | Freq: Once | INTRAMUSCULAR | Status: DC | PRN

## 2019-06-03 MED ORDER — CEFAZOLIN SODIUM-DEXTROSE 2-4 GM/100ML-% IV SOLN
2.0000 g | INTRAVENOUS | Status: AC
  Administered 2019-06-03: 2 g via INTRAVENOUS

## 2019-06-03 MED ORDER — DEXAMETHASONE SODIUM PHOSPHATE 10 MG/ML IJ SOLN
INTRAMUSCULAR | Status: AC
  Filled 2019-06-03: qty 1

## 2019-06-03 MED ORDER — PROMETHAZINE HCL 25 MG/ML IJ SOLN: 6.2500 mg | INTRAMUSCULAR | Status: DC | PRN

## 2019-06-03 MED ORDER — MEPERIDINE HCL 50 MG/ML IJ SOLN: 6.2500 mg | INTRAMUSCULAR | Status: DC | PRN

## 2019-06-03 MED ORDER — ROCURONIUM BROMIDE 10 MG/ML (PF) SYRINGE
PREFILLED_SYRINGE | INTRAVENOUS | Status: DC | PRN
  Administered 2019-06-03: 60 mg via INTRAVENOUS

## 2019-06-03 MED ORDER — DEXAMETHASONE SODIUM PHOSPHATE 10 MG/ML IJ SOLN
INTRAMUSCULAR | Status: DC | PRN
  Administered 2019-06-03: 10 mg via INTRAVENOUS

## 2019-06-03 MED ORDER — ONDANSETRON HCL 4 MG/2ML IJ SOLN
INTRAMUSCULAR | Status: AC
  Filled 2019-06-03: qty 2

## 2019-06-03 MED ORDER — CEFAZOLIN (ANCEF) 1 G IV SOLR: 2.0000 g | INTRAVENOUS | Status: DC

## 2019-06-03 MED ORDER — CEFAZOLIN SODIUM-DEXTROSE 2-4 GM/100ML-% IV SOLN
INTRAVENOUS | Status: AC
  Filled 2019-06-03: qty 100

## 2019-06-03 MED ORDER — PROPOFOL 10 MG/ML IV BOLUS
INTRAVENOUS | Status: AC
  Filled 2019-06-03: qty 20

## 2019-06-03 MED ORDER — CHLORHEXIDINE GLUCONATE CLOTH 2 % EX PADS
6.0000 | MEDICATED_PAD | Freq: Every day | CUTANEOUS | Status: DC
  Administered 2019-06-04 – 2019-06-09 (×6): 6 via TOPICAL

## 2019-06-03 MED ORDER — MIDAZOLAM HCL 2 MG/2ML IJ SOLN
INTRAMUSCULAR | Status: AC
  Filled 2019-06-03: qty 2

## 2019-06-03 MED ORDER — LIDOCAINE 2% (20 MG/ML) 5 ML SYRINGE
INTRAMUSCULAR | Status: AC
  Filled 2019-06-03: qty 5

## 2019-06-03 SURGICAL SUPPLY — 38 items
APL SWBSTK 6 STRL LF DISP (MISCELLANEOUS) ×2
APPLICATOR COTTON TIP 6 STRL (MISCELLANEOUS) ×2 IMPLANT
APPLICATOR COTTON TIP 6IN STRL (MISCELLANEOUS) ×3
BLADE EXTENDED COATED 6.5IN (ELECTRODE) IMPLANT
BLADE HEX COATED 2.75 (ELECTRODE) ×3 IMPLANT
COVER MAYO STAND STRL (DRAPES) ×2 IMPLANT
COVER WAND RF STERILE (DRAPES) IMPLANT
DRAPE LAPAROSCOPIC ABDOMINAL (DRAPES) ×3 IMPLANT
DRAPE WARM FLUID 44X44 (DRAPES) IMPLANT
DRSG OPSITE POSTOP 4X10 (GAUZE/BANDAGES/DRESSINGS) ×2 IMPLANT
ELECT REM PT RETURN 15FT ADLT (MISCELLANEOUS) ×3 IMPLANT
GAUZE SPONGE 4X4 12PLY STRL (GAUZE/BANDAGES/DRESSINGS) ×3 IMPLANT
GLOVE BIO SURGEON STRL SZ7.5 (GLOVE) ×6 IMPLANT
GLOVE BIOGEL PI IND STRL 7.0 (GLOVE) ×2 IMPLANT
GLOVE BIOGEL PI INDICATOR 7.0 (GLOVE) ×1
GOWN STRL REUS W/ TWL XL LVL3 (GOWN DISPOSABLE) ×2 IMPLANT
GOWN STRL REUS W/TWL LRG LVL3 (GOWN DISPOSABLE) ×3 IMPLANT
GOWN STRL REUS W/TWL XL LVL3 (GOWN DISPOSABLE) ×6 IMPLANT
HANDLE SUCTION POOLE (INSTRUMENTS) IMPLANT
KIT BASIN OR (CUSTOM PROCEDURE TRAY) ×3 IMPLANT
KIT TURNOVER KIT A (KITS) IMPLANT
NS IRRIG 1000ML POUR BTL (IV SOLUTION) ×3 IMPLANT
PACK GENERAL/GYN (CUSTOM PROCEDURE TRAY) ×3 IMPLANT
PENCIL SMOKE EVACUATOR (MISCELLANEOUS) IMPLANT
SPONGE LAP 18X18 RF (DISPOSABLE) IMPLANT
STAPLER VISISTAT 35W (STAPLE) ×3 IMPLANT
SUCTION POOLE HANDLE (INSTRUMENTS)
SUT PDS AB 1 CTX 36 (SUTURE) IMPLANT
SUT SILK 2 0 (SUTURE) ×3
SUT SILK 2 0 SH CR/8 (SUTURE) ×2 IMPLANT
SUT SILK 2-0 18XBRD TIE 12 (SUTURE) ×1 IMPLANT
SUT SILK 3 0 (SUTURE) ×3
SUT SILK 3 0 SH CR/8 (SUTURE) ×2 IMPLANT
SUT SILK 3-0 18XBRD TIE 12 (SUTURE) ×1 IMPLANT
TOWEL OR 17X26 10 PK STRL BLUE (TOWEL DISPOSABLE) ×6 IMPLANT
TRAY FOLEY MTR SLVR 16FR STAT (SET/KITS/TRAYS/PACK) IMPLANT
WATER STERILE IRR 1000ML POUR (IV SOLUTION) ×3 IMPLANT
YANKAUER SUCT BULB TIP NO VENT (SUCTIONS) IMPLANT

## 2019-06-03 NOTE — Progress Notes (Signed)
Gastrografin given by day RN at [facility]. NGT unclamped resumed LIS  at 1950.

## 2019-06-03 NOTE — Anesthesia Preprocedure Evaluation (Addendum)
Anesthesia Evaluation  Patient identified by MRN, date of birth, ID band Patient awake    Reviewed: Allergy & Precautions, NPO status , Patient's Chart, lab work & pertinent test results, reviewed documented beta blocker date and time   Airway Mallampati: I       Dental  (+) Poor Dentition, Missing,    Pulmonary neg pulmonary ROS,    Pulmonary exam normal breath sounds clear to auscultation       Cardiovascular hypertension, Pt. on medications and Pt. on home beta blockers Normal cardiovascular exam Rhythm:Regular Rate:Normal     Neuro/Psych negative neurological ROS     GI/Hepatic   Endo/Other  diabetes, Type 2  Renal/GU   negative genitourinary   Musculoskeletal negative musculoskeletal ROS (+)   Abdominal (+) + obese,   Peds  Hematology negative hematology ROS (+)   Anesthesia Other Findings   Reproductive/Obstetrics                            Anesthesia Physical Anesthesia Plan  ASA: II  Anesthesia Plan: General   Post-op Pain Management:    Induction: Intravenous and Cricoid pressure planned  PONV Risk Score and Plan: 4 or greater and Ondansetron, Midazolam and Treatment may vary due to age or medical condition  Airway Management Planned: Oral ETT  Additional Equipment: None  Intra-op Plan:   Post-operative Plan: Extubation in OR  Informed Consent: I have reviewed the patients History and Physical, chart, labs and discussed the procedure including the risks, benefits and alternatives for the proposed anesthesia with the patient or authorized representative who has indicated his/her understanding and acceptance.     Dental advisory given  Plan Discussed with: CRNA  Anesthesia Plan Comments:        Anesthesia Quick Evaluation

## 2019-06-03 NOTE — Transfer of Care (Signed)
Immediate Anesthesia Transfer of Care Note  Patient: Susan Whitaker  Procedure(s) Performed: EXPLORATORY LAPAROTOMY WITH LYSIS OF ADHESIONS (N/A Abdomen)  Patient Location: PACU  Anesthesia Type:General  Level of Consciousness: awake, alert , oriented and patient cooperative  Airway & Oxygen Therapy: Patient Spontanous Breathing and Patient connected to face mask oxygen  Post-op Assessment: Report given to RN, Post -op Vital signs reviewed and stable and Patient moving all extremities  Post vital signs: Reviewed and stable  Last Vitals:  Vitals Value Taken Time  BP 143/81 06/03/19 1130  Temp    Pulse 89 06/03/19 1131  Resp 14 06/03/19 1131  SpO2 100 % 06/03/19 1131  Vitals shown include unvalidated device data.  Last Pain:  Vitals:   06/03/19 0835  TempSrc:   PainSc: Asleep      Patients Stated Pain Goal: 0 ([id number])  Complications: No apparent anesthesia complications

## 2019-06-03 NOTE — Progress Notes (Signed)
Subjective/Chief Complaint: Still having pain despite ng decompression   Objective: Vital signs in last 24 hours: Temp:  [98 F (36.7 C)-99.6 F (37.6 C)] 99.6 F (37.6 C) (01/17 0604) Pulse Rate:  [89-104] 96 (01/17 0604) Resp:  [16-25] 16 (01/17 0604) BP: (118-139)/(56-77) 137/75 (01/17 0604) SpO2:  [94 %-99 %] 94 % (01/17 0604) Last BM Date: 06/02/19  Intake/Output from previous day: 01/16 0701 - 01/17 0700 In: 2003.8 [I.V.:2003.8] Out: 650 [Urine:500; Emesis/NG output:150] Intake/Output this shift: No intake/output data recorded.  General appearance: alert and cooperative Resp: clear to auscultation bilaterally Cardio: regular rate and rhythm GI: soft with moderate diffuse tenderness  Lab Results:  Recent Labs    06/02/19 0529  WBC 10.0  HGB 13.7  HCT 42.2  PLT 430*   BMET Recent Labs    06/02/19 0529 06/03/19 0527  NA 135 135  K 4.0 5.2*  CL 99 102  CO2 26 21*  GLUCOSE 140* 104*  BUN 22* 23*  CREATININE 0.96 1.00  CALCIUM 9.1 8.6*   PT/INR No results for input(s): LABPROT, INR in the last 72 hours. ABG No results for input(s): PHART, HCO3 in the last 72 hours.  Invalid input(s): PCO2, PO2  Studies/Results: CT ABDOMEN PELVIS W CONTRAST  Result Date: 06/02/2019 CLINICAL DATA:  Abdominal pain with vomiting EXAM: CT ABDOMEN AND PELVIS WITH CONTRAST TECHNIQUE: Multidetector CT imaging of the abdomen and pelvis was performed using the standard protocol following bolus administration of intravenous contrast. CONTRAST:  155mL OMNIPAQUE IOHEXOL 300 MG/ML  SOLN COMPARISON:  07/27/2018 FINDINGS: Lower chest: Minor dependent basilar hypoventilatory changes. Normal heart size. No pericardial or pleural effusion. Hepatobiliary: No focal liver abnormality is seen. No gallstones, gallbladder wall thickening, or biliary dilatation. Pancreas: Unremarkable. No pancreatic ductal dilatation or surrounding inflammatory changes. Spleen: Normal in size without focal  abnormality. Adrenals/Urinary Tract: Adrenal glands are unremarkable. Kidneys are normal, without renal calculi, focal lesion, or hydronephrosis. Bladder is unremarkable. Stomach/Bowel: Within the mid abdomen extending to the umbilical level, there are distended fluid-filled loops of bowel with associated air-fluid levels with slight wall prominence and trace adjacent mesenteric free fluid in the right abdomen. Small bowel is dilated up to 3.7 cm. Appearance compatible with developing small bowel obstruction. Difficult to exclude a closed loop obstruction related to adhesions or internal herniation. No associated pneumatosis free air, fluid collection, abscess, or hematoma. Vascular/Lymphatic: Atherosclerosis of aorta. Negative for aneurysm or dissection. No occlusive process. Mesenteric and renal vasculature appear patent. No veno-occlusive disease. No bulky adenopathy. Reproductive: Again noted is uterine enlargement related to calcified degenerated fibroids. No other adnexal abnormality or free fluid. No pelvic fluid collection or abscess. Other: No abdominal wall hernia or abnormality. No abdominopelvic ascites. Musculoskeletal: Degenerative changes of the spine. Lumbar facet arthropathy also evident. No acute osseous finding. IMPRESSION: Mid abdominal fluid filled and dilated small bowel with air-fluid levels, wall prominence and adjacent mesenteric free fluid compatible with nonspecific obstruction pattern. Difficult to exclude a closed loop obstruction related to adhesions or internal herniation. These results were called by telephone at the time of interpretation on 06/02/2019 at 9:42 am to provider Dr Ronnald Nian, who verbally acknowledged these results. Electronically Signed   By: Jerilynn Mages.  Shick M.D.   On: 06/02/2019 09:45   DG Abd Portable 1V-Small Bowel Protocol-Position Verification  Result Date: 06/02/2019 CLINICAL DATA:  NG tube placement. EXAM: PORTABLE ABDOMEN - 1 VIEW COMPARISON:  None. FINDINGS:  Enteric catheter overlies the gastric body, coiling on itself with tip in the  gastric cardia. The bowel gas pattern is normal. No radio-opaque calculi or other significant radiographic abnormality are seen. IMPRESSION: Enteric catheter overlies the gastric body, coiling on itself with tip in the gastric cardia. Electronically Signed   By: Fidela Salisbury M.D.   On: 06/02/2019 16:43   DG Abd Portable 2V  Result Date: 06/03/2019 CLINICAL DATA:  8 HR DELAY IMAGE FOR SMALL BOWEL OBSTRUCTION EXAM: PORTABLE ABDOMEN - 2 VIEW COMPARISON:  Radiograph 06/02/2019 at 3 p.m. FINDINGS: NG tube in stomach. There is only small volume of oral contrast within the stomach. No progression out of the stomach. Dilated loop of small bowel in the LEFT central abdomen measures 4.6 cm which is increased from 3.7 cm on comparison exam. No intraperitoneal free air identified. Per nursing staff, NG tube was clamped for 1.5 hours after administration of oral contrast IMPRESSION: 1. No advancement of small volume oral contrast from the stomach. 2. Progressive enlargement of loop of bowel in the mid abdomen. Concern remains for mechanical small bowel obstruction. Findings discussed with floor nurse on 06/03/2019  at05:49. Electronically Signed   By: Suzy Bouchard M.D.   On: 06/03/2019 05:50    Anti-infectives: Anti-infectives (From admission, onward)   Start     Dose/Rate Route Frequency Ordered Stop   06/03/19 0745  ceFAZolin (ANCEF) powder 2 g     2 g Other To Surgery 06/03/19 0744 06/04/19 0745      Assessment/Plan: s/p * No surgery found * No improvement with this am xrays  Will plan for ex lap today. Risks and benefits of the surgery as well as some of the technical aspects discussed with patient including possible need for bowel resection and she understands and wishes to proceed  LOS: 1 day    Autumn Messing III 06/03/2019

## 2019-06-03 NOTE — Anesthesia Procedure Notes (Signed)
Procedure Name: Intubation Date/Time: 06/03/2019 10:17 AM Performed by: Mitzie Na, CRNA Pre-anesthesia Checklist: Patient identified, Emergency Drugs available, Suction available and Patient being monitored Patient Re-evaluated:Patient Re-evaluated prior to induction Oxygen Delivery Method: Circle system utilized Preoxygenation: Pre-oxygenation with 100% oxygen Induction Type: Rapid sequence and Cricoid Pressure applied Ventilation: Mask ventilation without difficulty Laryngoscope Size: Glidescope and 4 Grade View: Grade II Tube type: Oral Tube size: 7.0 mm Number of attempts: 2 Airway Equipment and Method: Stylet and Oral airway Placement Confirmation: ETT inserted through vocal cords under direct vision,  positive ETCO2 and breath sounds checked- equal and bilateral Secured at: 25 cm Tube secured with: Tape Dental Injury: Teeth and Oropharynx as per pre-operative assessment

## 2019-06-03 NOTE — Op Note (Addendum)
06/03/2019  11:17 AM  PATIENT:  Susan Whitaker  49 y.o. female  PRE-OPERATIVE DIAGNOSIS:  Small bowel obstruction  POST-OPERATIVE DIAGNOSIS:  Small bowel obstruction  PROCEDURE:  Procedure(s): EXPLORATORY LAPAROTOMY WITH LYSIS OF ADHESIONS (N/A)  SURGEON:  Surgeon(s) and Role:    * Jovita Kussmaul, MD - Primary    Michael Boston, MD - Assisting  PHYSICIAN ASSISTANT:   ASSISTANTS: Dr. Johney Maine   ANESTHESIA:   general  EBL:  20 mL   BLOOD ADMINISTERED:none  DRAINS: none   LOCAL MEDICATIONS USED:  NONE  SPECIMEN:  No Specimen  DISPOSITION OF SPECIMEN:  N/A  COUNTS:  YES  TOURNIQUET:  * No tourniquets in log *  DICTATION: .Dragon Dictation   After informed consent was obtained the patient was brought to the operating room and placed in the supine position on the operating table.  After adequate induction of general anesthesia the patient's abdomen was prepped with ChloraPrep, allowed to dry, and draped in usual sterile manner.  An appropriate timeout was performed.  An upper midline incision was then made with a 10 blade knife.  The incision was carried through the skin and subcutaneous tissue sharply with the electrocautery until the linea alba and previous stitches were identified.  The linea alba was incised with the electrocautery.  The fascia was elevated anteriorly using Kocher clamps.  Blunt dissection was carried out of the peritoneum until the peritoneum was opened and access was gained to the abdominal cavity.  The rest of the incision was opened under direct vision.  There were 2 small adhesions of omentum and epiploic appendage to the anterior abdominal wall.  These were taken down sharply with scissors.  We were then able to run the small bowel from the ligament of Treitz to the ileocecal valve.  I was able to identify a significant bandlike area of obstruction in the proximal small bowel.  The band was lysed with the scissors.  Once this was accomplished it appeared as  though the obstruction was relieved.  In running the small bowel there were other areas of scar tissue that seem to be fairly tight but not causing an obstruction.  All of these areas were taken down sharply with scissors.  Once this was accomplished we then ran the small bowel again from the ligament of Treitz to the ileocecal valve and the bowel appeared to be free with no areas of kinking or obstruction.  I also identified 1 small serosal tear made during the dissection and repaired this with interrupted 3-0 silk Lembert stitches.  The colon was evaluated and appeared normal.  No other abnormalities were noted on general inspection of the abdominal cavity.  The abdomen is then irrigated with saline.  The position of the NG tube was confirmed.  The anterior abdominal wall fascia was then closed with 2 running #1 double-stranded looped PDS sutures.  The subcutaneous tissue was irrigated with saline.  The skin was closed with staples.  Sterile dressings were applied.  The patient tolerated the procedure well.  At the end of the case all needle sponge and instrument counts were correct.  The patient was then awakened and taken to recovery in stable condition. The assistant was essential for help with retraction and visualization during the case  PLAN OF CARE: Admit to inpatient   PATIENT DISPOSITION:  PACU - hemodynamically stable.   Delay start of Pharmacological VTE agent (>24hrs) due to surgical blood loss or risk of bleeding: no

## 2019-06-04 ENCOUNTER — Encounter: Payer: Self-pay | Admitting: *Deleted

## 2019-06-04 LAB — GLUCOSE, CAPILLARY
Glucose-Capillary: 87 mg/dL (ref 70–99)
Glucose-Capillary: 74 mg/dL (ref 70–99)
Glucose-Capillary: 73 mg/dL (ref 70–99)
Glucose-Capillary: 69 mg/dL — ABNORMAL LOW (ref 70–99)

## 2019-06-04 MED ORDER — LIP MEDEX EX OINT
TOPICAL_OINTMENT | CUTANEOUS | Status: AC
  Administered 2019-06-04: 1
  Filled 2019-06-04: qty 7

## 2019-06-04 MED ORDER — METHOCARBAMOL 1000 MG/10ML IJ SOLN
500.0000 mg | Freq: Four times a day (QID) | INTRAVENOUS | Status: DC | PRN
  Administered 2019-06-04 – 2019-06-05 (×2): 500 mg via INTRAVENOUS
  Filled 2019-06-04: qty 500
  Filled 2019-06-04: qty 5
  Filled 2019-06-04: qty 500

## 2019-06-04 MED ORDER — MORPHINE SULFATE (PF) 2 MG/ML IV SOLN
1.0000 mg | INTRAVENOUS | Status: DC | PRN
  Administered 2019-06-04 – 2019-06-05 (×3): 4 mg via INTRAVENOUS
  Administered 2019-06-05: 2 mg via INTRAVENOUS
  Filled 2019-06-04: qty 1
  Filled 2019-06-04 (×4): qty 2

## 2019-06-04 MED ORDER — KETOROLAC TROMETHAMINE 15 MG/ML IJ SOLN
15.0000 mg | Freq: Three times a day (TID) | INTRAMUSCULAR | Status: DC
  Administered 2019-06-04 – 2019-06-07 (×9): 15 mg via INTRAVENOUS
  Filled 2019-06-04 (×9): qty 1

## 2019-06-04 MED ORDER — ACETAMINOPHEN 10 MG/ML IV SOLN
1000.0000 mg | Freq: Four times a day (QID) | INTRAVENOUS | Status: AC
  Administered 2019-06-04 – 2019-06-05 (×4): 1000 mg via INTRAVENOUS
  Filled 2019-06-04 (×4): qty 100

## 2019-06-04 NOTE — Plan of Care (Signed)
  Problem: Clinical Measurements: Goal: Will remain free from infection Outcome: Progressing Goal: Respiratory complications will improve Outcome: Progressing Goal: Cardiovascular complication will be avoided Outcome: Progressing   Problem: Elimination: Goal: Will not experience complications related to urinary retention Outcome: Progressing   Problem: Skin Integrity: Goal: Risk for impaired skin integrity will decrease Outcome: Progressing   Problem: Clinical Measurements: Goal: Ability to maintain clinical measurements within normal limits will improve Outcome: Progressing Goal: Postoperative complications will be avoided or minimized Outcome: Progressing   Problem: Skin Integrity: Goal: Demonstration of wound healing without infection will improve Outcome: Progressing   Problem: Education: Goal: Knowledge of General Education information will improve Description: Including pain rating scale, medication(s)/side effects and non-pharmacologic comfort measures Outcome: Not Progressing   Problem: Health Behavior/Discharge Planning: Goal: Ability to manage health-related needs will improve Outcome: Not Progressing   Problem: Clinical Measurements: Goal: Ability to maintain clinical measurements within normal limits will improve Outcome: Not Progressing Goal: Diagnostic test results will improve Outcome: Not Progressing   Problem: Activity: Goal: Risk for activity intolerance will decrease Outcome: Not Progressing   Problem: Nutrition: Goal: Adequate nutrition will be maintained Outcome: Not Progressing   Problem: Coping: Goal: Level of anxiety will decrease Outcome: Not Progressing   Problem: Elimination: Goal: Will not experience complications related to bowel motility Outcome: Not Progressing   Problem: Pain Managment: Goal: General experience of comfort will improve Outcome: Not Progressing   Problem: Safety: Goal: Ability to remain free from injury will  improve Outcome: Not Progressing   Problem: Education: Goal: Required Educational Video(s) Outcome: Not Progressing

## 2019-06-04 NOTE — Progress Notes (Signed)
Assessment & Plan: POD#1 - ex lap with lysis of adhesions - Dr. Marlou Starks 06/03/2019  NG, NPO, IV hydration  Pain Rx  Encouraged OOB, ambulation        [name], MD       [facility]       Office: [phone]   Chief Complaint: Small bowel obstruction  Subjective: Patient in bed, complains of incisional pain.  Objective: Vital signs in last 24 hours: Temp:  [97.8 F (36.6 C)-99.6 F (37.6 C)] 99.2 F (37.3 C) (01/17 2022) Pulse Rate:  [87-106] 106 (01/17 2022) Resp:  [14-17] 16 (01/17 2022) BP: (125-145)/(66-81) 133/73 (01/17 2022) SpO2:  [93 %-100 %] 94 % (01/17 2022) Last BM Date: 06/02/19  Intake/Output from previous day: 01/17 0701 - 01/18 0700 In: 1525.5 [I.V.:1425.5; IV Piggyback:100] Out: [id number] [Urine:1475; Blood:20] Intake/Output this shift: No intake/output data recorded.  Physical Exam: HEENT - sclerae clear, mucous membranes moist Neck - soft Chest - clear bilaterally Cor - RRR Abdomen - soft, dressing dry and intact; quiet; mild incisional tenderness  Lab Results:  Recent Labs    06/02/19 0529 06/03/19 0527  WBC 10.0 17.7*  HGB 13.7 18.3*  HCT 42.2 56.1*  PLT 430* 249   BMET Recent Labs    06/02/19 0529 06/03/19 0527  NA 135 135  K 4.0 5.2*  CL 99 102  CO2 26 21*  GLUCOSE 140* 104*  BUN 22* 23*  CREATININE 0.96 1.00  CALCIUM 9.1 8.6*   PT/INR No results for input(s): LABPROT, INR in the last 72 hours. Comprehensive Metabolic Panel:    Component Value Date/Time   NA 135 06/03/2019 0527   NA 135 06/02/2019 0529   NA 138 08/21/2012 1002   K 5.2 (H) 06/03/2019 0527   K 4.0 06/02/2019 0529   K 3.5 08/21/2012 1002   CL 102 06/03/2019 0527   CL 99 06/02/2019 0529   CL 102 08/21/2012 1002   CO2 21 (L) 06/03/2019 0527   CO2 26 06/02/2019 0529   CO2 28 08/21/2012 1002   BUN 23 (H) 06/03/2019 0527   BUN 22 (H) 06/02/2019 0529   BUN 16.7 08/21/2012 1002   CREATININE 1.00 06/03/2019 0527   CREATININE 0.96  06/02/2019 0529   CREATININE 0.83 12/03/2013 1112   CREATININE 0.83 04/13/2013 1022   CREATININE 1.0 08/21/2012 1002   GLUCOSE 104 (H) 06/03/2019 0527   GLUCOSE 140 (H) 06/02/2019 0529   GLUCOSE 132 (H) 08/21/2012 1002   CALCIUM 8.6 (L) 06/03/2019 0527   CALCIUM 9.1 06/02/2019 0529   CALCIUM 8.9 08/21/2012 1002   AST 17 06/02/2019 0529   AST 13 04/11/2019 0956   AST 10 08/21/2012 1002   ALT 15 06/02/2019 0529   ALT 13 04/11/2019 0956   ALT 8 08/21/2012 1002   ALKPHOS 43 06/02/2019 0529   ALKPHOS 47 04/11/2019 0956   ALKPHOS 60 08/21/2012 1002   BILITOT 0.5 06/02/2019 0529   BILITOT 0.4 04/11/2019 0956   BILITOT 0.22 08/21/2012 1002   PROT 8.2 (H) 06/02/2019 0529   PROT 8.0 04/11/2019 0956   PROT 7.6 08/21/2012 1002   ALBUMIN 4.0 06/02/2019 0529   ALBUMIN 4.0 04/11/2019 0956   ALBUMIN 3.3 (L) 08/21/2012 1002    Studies/Results: DG Abd Portable 1V-Small Bowel Protocol-Position Verification  Result Date: 06/02/2019 CLINICAL DATA:  NG tube placement. EXAM: PORTABLE ABDOMEN - 1 VIEW COMPARISON:  None. FINDINGS: Enteric catheter overlies the gastric body, coiling on itself with tip in the  gastric cardia. The bowel gas pattern is normal. No radio-opaque calculi or other significant radiographic abnormality are seen. IMPRESSION: Enteric catheter overlies the gastric body, coiling on itself with tip in the gastric cardia. Electronically Signed   By: Fidela Salisbury M.D.   On: 06/02/2019 16:43   DG Abd Portable 2V  Result Date: 06/03/2019 CLINICAL DATA:  8 HR DELAY IMAGE FOR SMALL BOWEL OBSTRUCTION EXAM: PORTABLE ABDOMEN - 2 VIEW COMPARISON:  Radiograph 06/02/2019 at 3 p.m. FINDINGS: NG tube in stomach. There is only small volume of oral contrast within the stomach. No progression out of the stomach. Dilated loop of small bowel in the LEFT central abdomen measures 4.6 cm which is increased from 3.7 cm on comparison exam. No intraperitoneal free air identified. Per nursing staff, NG  tube was clamped for 1.5 hours after administration of oral contrast IMPRESSION: 1. No advancement of small volume oral contrast from the stomach. 2. Progressive enlargement of loop of bowel in the mid abdomen. Concern remains for mechanical small bowel obstruction. Findings discussed with floor nurse on 06/03/2019  at05:49. Electronically Signed   By: Suzy Bouchard M.D.   On: 06/03/2019 05:50      [name] 06/04/2019  Patient ID: Susan Whitaker, female   DOB: November 17, 1970, 49 y.o.   MRN: [id number]

## 2019-06-05 LAB — CBC
HCT: 38 % (ref 36.0–46.0)
Hemoglobin: 12.1 g/dL (ref 12.0–15.0)
MCH: 30 pg (ref 26.0–34.0)
MCHC: 31.8 g/dL (ref 30.0–36.0)
MCV: 94.1 fL (ref 80.0–100.0)
Platelets: 355 10*3/uL (ref 150–400)
RBC: 4.04 MIL/uL (ref 3.87–5.11)
RDW: 13.4 % (ref 11.5–15.5)
WBC: 11.6 10*3/uL — ABNORMAL HIGH (ref 4.0–10.5)
nRBC: 0 % (ref 0.0–0.2)

## 2019-06-05 LAB — BASIC METABOLIC PANEL
Anion gap: 10 (ref 5–15)
BUN: 13 mg/dL (ref 6–20)
CO2: 21 mmol/L — ABNORMAL LOW (ref 22–32)
Calcium: 7.9 mg/dL — ABNORMAL LOW (ref 8.9–10.3)
Chloride: 105 mmol/L (ref 98–111)
Creatinine, Ser: 0.64 mg/dL (ref 0.44–1.00)
GFR calc Af Amer: >60 mL/min (ref >60)
GFR calc non Af Amer: >60 mL/min (ref >60)
Glucose, Bld: 67 mg/dL — ABNORMAL LOW (ref 70–99)
Potassium: 4 mmol/L (ref 3.5–5.1)
Sodium: 136 mmol/L (ref 135–145)

## 2019-06-05 LAB — GLUCOSE, CAPILLARY
Glucose-Capillary: 68 mg/dL — ABNORMAL LOW (ref 70–99)
Glucose-Capillary: 62 mg/dL — ABNORMAL LOW (ref 70–99)
Glucose-Capillary: 69 mg/dL — ABNORMAL LOW (ref 70–99)
Glucose-Capillary: 67 mg/dL — ABNORMAL LOW (ref 70–99)
Glucose-Capillary: 81 mg/dL (ref 70–99)
Glucose-Capillary: 112 mg/dL — ABNORMAL HIGH (ref 70–99)

## 2019-06-05 MED ORDER — HYDROMORPHONE HCL 1 MG/ML IJ SOLN
0.5000 mg | INTRAMUSCULAR | Status: DC | PRN
  Administered 2019-06-05 – 2019-06-06 (×2): 1 mg via INTRAVENOUS
  Filled 2019-06-05 (×3): qty 1

## 2019-06-05 MED ORDER — ENOXAPARIN SODIUM 40 MG/0.4ML ~~LOC~~ SOLN
40.0000 mg | SUBCUTANEOUS | Status: DC
  Administered 2019-06-05 – 2019-06-11 (×7): 40 mg via SUBCUTANEOUS
  Filled 2019-06-05 (×7): qty 0.4

## 2019-06-05 MED ORDER — ACETAMINOPHEN 10 MG/ML IV SOLN
1000.0000 mg | Freq: Four times a day (QID) | INTRAVENOUS | Status: AC
  Administered 2019-06-05 – 2019-06-06 (×4): 1000 mg via INTRAVENOUS
  Filled 2019-06-05 (×4): qty 100

## 2019-06-05 MED ORDER — KCL IN DEXTROSE-NACL 10-5-0.45 MEQ/L-%-% IV SOLN
INTRAVENOUS | Status: DC
  Filled 2019-06-05 (×6): qty 1000

## 2019-06-05 MED ORDER — METHOCARBAMOL 1000 MG/10ML IJ SOLN
500.0000 mg | Freq: Four times a day (QID) | INTRAVENOUS | Status: DC
  Administered 2019-06-05 – 2019-06-07 (×8): 500 mg via INTRAVENOUS
  Filled 2019-06-05 (×5): qty 500
  Filled 2019-06-05: qty 5
  Filled 2019-06-05 (×2): qty 500
  Filled 2019-06-05: qty 5

## 2019-06-05 MED ORDER — PHENOL 1.4 % MT LIQD
1.0000 | OROMUCOSAL | Status: DC | PRN
  Administered 2019-06-05: 06:00:00 1 via OROMUCOSAL
  Filled 2019-06-05: qty 177

## 2019-06-05 MED ORDER — METOPROLOL TARTRATE 5 MG/5ML IV SOLN
5.0000 mg | Freq: Four times a day (QID) | INTRAVENOUS | Status: DC
  Administered 2019-06-05 – 2019-06-07 (×7): 5 mg via INTRAVENOUS
  Filled 2019-06-05 (×7): qty 5

## 2019-06-05 NOTE — Anesthesia Postprocedure Evaluation (Signed)
Anesthesia Post Note  Patient: Susan Whitaker  Procedure(s) Performed: EXPLORATORY LAPAROTOMY WITH LYSIS OF ADHESIONS (N/A Abdomen)     Patient location during evaluation: PACU Anesthesia Type: General Level of consciousness: awake Pain management: pain level controlled Vital Signs Assessment: post-procedure vital signs reviewed and stable Respiratory status: spontaneous breathing Cardiovascular status: stable Postop Assessment: no apparent nausea or vomiting Anesthetic complications: no    Last Vitals:  Vitals:   06/05/19 0117 06/05/19 0510  BP: 129/76 (!) 144/74  Pulse: 81 78  Resp: 17   Temp: 36.7 C 36.9 C  SpO2: 93% 95%    Last Pain:  Vitals:   06/05/19 0542  TempSrc:   PainSc: Asleep   Pain Goal: Patients Stated Pain Goal: 2 (06/04/19 2021)                 Huston Foley

## 2019-06-05 NOTE — Progress Notes (Signed)
[facility] Progress Note  2 Days Post-Op  Subjective: CC: pain Patient reports incisional pain with some mild generalized abdominal pain as well. She reports overall that she is feeling better today than yesterday. Was not able to get up at all yesterday, but we discussed mobilizing today. Concerned that she has had some low blood sugars, has not gotten insulin. Her daughter is coming up at 10:00 and she requests that someone come back by when she is here.   Objective: Vital signs in last 24 hours: Temp:  [98.1 F (36.7 C)-99.1 F (37.3 C)] 98.4 F (36.9 C) (01/19 0510) Pulse Rate:  [78-95] 78 (01/19 0510) Resp:  [16-18] 17 (01/19 0117) BP: (129-144)/(69-77) 144/74 (01/19 0510) SpO2:  [93 %-97 %] 95 % (01/19 0510) Last BM Date: (PTA)  Intake/Output from previous day: 01/18 0701 - 01/19 0700 In: 2366.8 [I.V.:2166.8; IV Piggyback:200] Out: 2000 [Urine:2000] Intake/Output this shift: No intake/output data recorded.  PE: Gen:  Alert, NAD, pleasant Card:  Regular rate and rhythm Pulm:  Normal effort, clear to auscultation bilaterally Abd: Soft, appropriately ttp, non-distended, BS hypoactive, incision c/d/i with staples present Skin: warm and dry, no rashes  Psych: A&Ox3   Lab Results:  Recent Labs    06/03/19 0527 06/05/19 0457  WBC 17.7* 11.6*  HGB 18.3* 12.1  HCT 56.1* 38.0  PLT 249 355   BMET Recent Labs    06/03/19 0527 06/05/19 0457  NA 135 136  K 5.2* 4.0  CL 102 105  CO2 21* 21*  GLUCOSE 104* 67*  BUN 23* 13  CREATININE 1.00 0.64  CALCIUM 8.6* 7.9*   PT/INR No results for input(s): LABPROT, INR in the last 72 hours. CMP     Component Value Date/Time   NA 136 06/05/2019 0457   NA 138 08/21/2012 1002   K 4.0 06/05/2019 0457   K 3.5 08/21/2012 1002   CL 105 06/05/2019 0457   CL 102 08/21/2012 1002   CO2 21 (L) 06/05/2019 0457   CO2 28 08/21/2012 1002   GLUCOSE 67 (L) 06/05/2019 0457   GLUCOSE 132 (H) 08/21/2012 1002   BUN 13  06/05/2019 0457   BUN 16.7 08/21/2012 1002   CREATININE 0.64 06/05/2019 0457   CREATININE 0.83 12/03/2013 1112   CREATININE 1.0 08/21/2012 1002   CALCIUM 7.9 (L) 06/05/2019 0457   CALCIUM 8.9 08/21/2012 1002   PROT 8.2 (H) 06/02/2019 0529   PROT 7.6 08/21/2012 1002   ALBUMIN 4.0 06/02/2019 0529   ALBUMIN 3.3 (L) 08/21/2012 1002   AST 17 06/02/2019 0529   AST 10 08/21/2012 1002   ALT 15 06/02/2019 0529   ALT 8 08/21/2012 1002   ALKPHOS 43 06/02/2019 0529   ALKPHOS 60 08/21/2012 1002   BILITOT 0.5 06/02/2019 0529   BILITOT 0.22 08/21/2012 1002   GFRNONAA >60 06/05/2019 0457   GFRNONAA 86 12/03/2013 1112   GFRAA >60 06/05/2019 0457   GFRAA >89 12/03/2013 1112   Lipase     Component Value Date/Time   LIPASE 30 06/02/2019 0529       Studies/Results: No results found.  Anti-infectives: Anti-infectives (From admission, onward)   Start     Dose/Rate Route Frequency Ordered Stop   06/03/19 1029  ceFAZolin (ANCEF) IVPB 2g/100 mL premix     2 g 200 mL/hr over 30 Minutes Intravenous 30 min pre-op 06/03/19 1030 06/03/19 1036   06/03/19 0745  ceFAZolin (ANCEF) powder 2 g  Status:  Discontinued     2 g Other  To Surgery 06/03/19 0744 06/03/19 1029       Assessment/Plan HTN HLD GERD T2DM - having some hypoglycemia, has not had any insulin and already on sensitive SSI, change fluids to D5 1/2 NS for now and monitor CBGs  SBO S/p exploratory laparotomy with LOA 06/03/19 Dr. Marlou Starks - POD#2 - continue NGT on LIWS, ok to clamp to mobilize - MOBILIZE!!! - await return in bowel function   FEN: NPO, IVF, NGT to LIWS VTE: SCDs, lovenox ID:  Ancef 1/17  LOS: 3 days    Brigid Re , [facility] 06/05/2019, 7:46 AM Please see Amion for pager number during day hours 7:00am-4:30pm

## 2019-06-05 NOTE — Progress Notes (Signed)
Hypoglycemic Event  CBG: 68 at 7:30  Treatment: D5 1/2 NS W/KCL @ 100  Symptoms: None   Follow-up CBG Time: 9:46 CBG Result: 62 Follow-up CBG Time: 10:29   CBG Result: 69  Possible Reasons for Event: Patient is NPO  Comments/MD notified: Second check noticed IV site leaking, replaced IV Brigid Re, PA aware.    Laurelin Elson A Travis Purk

## 2019-06-06 LAB — CBC
HCT: 38.8 % (ref 36.0–46.0)
Hemoglobin: 12.4 g/dL (ref 12.0–15.0)
MCH: 29.4 pg (ref 26.0–34.0)
MCHC: 32 g/dL (ref 30.0–36.0)
MCV: 91.9 fL (ref 80.0–100.0)
Platelets: 396 10*3/uL (ref 150–400)
RBC: 4.22 MIL/uL (ref 3.87–5.11)
RDW: 13.1 % (ref 11.5–15.5)
WBC: 11.1 10*3/uL — ABNORMAL HIGH (ref 4.0–10.5)
nRBC: 0 % (ref 0.0–0.2)

## 2019-06-06 LAB — GLUCOSE, CAPILLARY
Glucose-Capillary: 99 mg/dL (ref 70–99)
Glucose-Capillary: 111 mg/dL — ABNORMAL HIGH (ref 70–99)
Glucose-Capillary: 99 mg/dL (ref 70–99)
Glucose-Capillary: 96 mg/dL (ref 70–99)

## 2019-06-06 LAB — BASIC METABOLIC PANEL
Anion gap: 9 (ref 5–15)
BUN: 10 mg/dL (ref 6–20)
CO2: 24 mmol/L (ref 22–32)
Calcium: 8.2 mg/dL — ABNORMAL LOW (ref 8.9–10.3)
Chloride: 102 mmol/L (ref 98–111)
Creatinine, Ser: 0.68 mg/dL (ref 0.44–1.00)
GFR calc Af Amer: >60 mL/min (ref >60)
GFR calc non Af Amer: >60 mL/min (ref >60)
Glucose, Bld: 102 mg/dL — ABNORMAL HIGH (ref 70–99)
Potassium: 3.6 mmol/L (ref 3.5–5.1)
Sodium: 135 mmol/L (ref 135–145)

## 2019-06-06 LAB — MAGNESIUM: Magnesium: 2.6 mg/dL — ABNORMAL HIGH (ref 1.7–2.4)

## 2019-06-06 MED ORDER — HYDROMORPHONE HCL 1 MG/ML IJ SOLN
0.5000 mg | INTRAMUSCULAR | Status: DC | PRN
  Administered 2019-06-06: 18:00:00 1 mg via INTRAVENOUS
  Filled 2019-06-06: qty 1

## 2019-06-06 MED ORDER — OXYCODONE HCL 5 MG PO TABS
5.0000 mg | ORAL_TABLET | ORAL | Status: DC | PRN
  Administered 2019-06-06 (×2): 5 mg via ORAL
  Filled 2019-06-06 (×2): qty 1

## 2019-06-06 MED ORDER — DOCUSATE SODIUM 100 MG PO CAPS
100.0000 mg | ORAL_CAPSULE | Freq: Two times a day (BID) | ORAL | Status: DC
  Administered 2019-06-06 – 2019-06-11 (×11): 100 mg via ORAL
  Filled 2019-06-06 (×11): qty 1

## 2019-06-06 MED ORDER — POTASSIUM CHLORIDE 10 MEQ/100ML IV SOLN
10.0000 meq | INTRAVENOUS | Status: AC
  Administered 2019-06-06 (×3): 10 meq via INTRAVENOUS
  Filled 2019-06-06: qty 100

## 2019-06-06 MED ORDER — GABAPENTIN 300 MG PO CAPS
300.0000 mg | ORAL_CAPSULE | Freq: Three times a day (TID) | ORAL | Status: DC
  Administered 2019-06-06 – 2019-06-11 (×16): 300 mg via ORAL
  Filled 2019-06-06 (×16): qty 1

## 2019-06-06 MED ORDER — ACETAMINOPHEN 325 MG PO TABS
650.0000 mg | ORAL_TABLET | Freq: Four times a day (QID) | ORAL | Status: DC
  Administered 2019-06-06 – 2019-06-08 (×8): 650 mg via ORAL
  Filled 2019-06-06 (×9): qty 2

## 2019-06-06 NOTE — Evaluation (Signed)
Physical Therapy Evaluation Patient Details Name: Susan Whitaker MRN: [id number] DOB: 03-23-1971 Today's Date: 06/06/2019   History of Present Illness  49 y/o female s/p exploratory lap with lysis of adhesions on 06/03/19. PMH includes HTN and diabetes  Clinical Impression  Pt admitted with above diagnosis. Pt currently with functional limitations due to the deficits listed below (see PT Problem List). Pt will benefit from skilled PT to increase their independence and safety with mobility to allow discharge to the venue listed below.  Pt and family preferring to ambulate in room only, but was able to ambulate ~70' with RW and min/guard with very slow, guarded cadence and decreased step length.  Family will be with her at d/c and she will initially stay downstairs where there is a half-bath.  Pt would benefit from HHPT, RW, and 3-1 BSC.  If pt progresses well enough in acute care could re-assess need for [facility] at that time.     Follow Up Recommendations Home health PT;Supervision for mobility/OOB    Equipment Recommendations  Rolling walker with 5" wheels;3in1 (PT)    Recommendations for Other Services       Precautions / Restrictions Precautions Precautions: Fall Restrictions Weight Bearing Restrictions: No      Mobility  Bed Mobility Overal bed mobility: Needs Assistance Bed Mobility: Rolling;Sidelying to Sit Rolling: Min guard Sidelying to sit: Min assist       General bed mobility comments: Rolled with min/guard and rail. MIN A for trunk with cues for technique  Transfers Overall transfer level: Needs assistance Equipment used: Rolling walker (2 wheeled) Transfers: Sit to/from Stand Sit to Stand: Min guard         General transfer comment: cues for proper hand placement and min/guard just to steady  Ambulation/Gait Ambulation/Gait assistance: Min guard Gait Distance (Feet): 70 Feet Assistive device: Rolling walker (2 wheeled) Gait Pattern/deviations: Decreased step  length - right;Decreased step length - left;Step-through pattern;Shuffle     General Gait Details: Pt with very short shuffeling steps initially, but with cues improved but still decreased. Ambulated very slowly and guarded with RW and no LOB.  Stairs            Wheelchair Mobility    Modified Rankin (Stroke Patients Only)       Balance Overall balance assessment: Needs assistance Sitting-balance support: Feet supported Sitting balance-Leahy Scale: Good     Standing balance support: Bilateral upper extremity supported Standing balance-Leahy Scale: Fair                               Pertinent Vitals/Pain Pain Assessment: 0-10 Pain Score: 5  Pain Location: Abd pain Pain Descriptors / Indicators: Sore Pain Intervention(s): Limited activity within patient's tolerance;Other (comment)(instruction in use of pillow for bracing)    Home Living Family/patient expects to be discharged to:: Private residence Living Arrangements: Children Available Help at Discharge: Family;Available 24 hours/day Type of Home: House Home Access: Stairs to enter Entrance Stairs-Rails: Right;Left;Can reach both Entrance Stairs-Number of Steps: 5 Home Layout: Two level;Able to live on main level with bedroom/bathroom;1/2 bath on main level Home Equipment: None      Prior Function Level of Independence: Independent               Hand Dominance        Extremity/Trunk Assessment   Upper Extremity Assessment Upper Extremity Assessment: Overall WFL for tasks assessed    Lower Extremity Assessment Lower Extremity  Assessment: Overall WFL for tasks assessed;Generalized weakness       Communication   Communication: No difficulties  Cognition Arousal/Alertness: Awake/alert Behavior During Therapy: WFL for tasks assessed/performed Overall Cognitive Status: Within Functional Limits for tasks assessed                                        General  Comments      Exercises     Assessment/Plan    PT Assessment Patient needs continued PT services  PT Problem List Decreased strength;Decreased mobility;Decreased balance;Decreased activity tolerance;Decreased knowledge of use of DME;Decreased safety awareness;Pain       PT Treatment Interventions DME instruction;Gait training;Stair training;Functional mobility training;Therapeutic activities;Therapeutic exercise;Balance training;Neuromuscular re-education;Patient/family education    PT Goals (Current goals can be found in the Care Plan section)  Acute Rehab PT Goals Patient Stated Goal: go home with family support PT Goal Formulation: With patient Time For Goal Achievement: 06/20/19 Potential to Achieve Goals: Good    Frequency Min 3X/week   Barriers to discharge        Co-evaluation               AM-PAC PT "6 Clicks" Mobility  Outcome Measure Help needed turning from your back to your side while in a flat bed without using bedrails?: None Help needed moving from lying on your back to sitting on the side of a flat bed without using bedrails?: A Little Help needed moving to and from a bed to a chair (including a wheelchair)?: A Little Help needed standing up from a chair using your arms (e.g., wheelchair or bedside chair)?: A Little Help needed to walk in hospital room?: A Little Help needed climbing 3-5 steps with a railing? : A Little 6 Click Score: 19    End of Session   Activity Tolerance: Patient tolerated treatment well Patient left: in chair;with call bell/phone within reach;with family/visitor present   PT Visit Diagnosis: Difficulty in walking, not elsewhere classified (R26.2);Pain    Time: 8413-2440 PT Time Calculation (min) (ACUTE ONLY): 28 min   Charges:   PT Evaluation $PT Eval Moderate Complexity: 1 Mod PT Treatments $Gait Training: 8-22 mins        Adessa Primiano L. Katrinka Blazing, [place] Pager [phone] 06/06/2019   Enzo Montgomery 06/06/2019, 12:00 PM

## 2019-06-06 NOTE — Progress Notes (Signed)
[facility] Progress Note  3 Days Post-Op  Subjective: CC: pain Pain control somewhat better. Reports flatus. Has only walked to the bathroom. Having a lot of secretions in her throat, denies any nausea.   Objective: Vital signs in last 24 hours: Temp:  [97.8 F (36.6 C)-99.6 F (37.6 C)] 98.6 F (37 C) (01/20 0618) Pulse Rate:  [75-88] 75 (01/20 0618) Resp:  [16-18] 17 (01/20 0618) BP: (139-189)/(75-93) 139/86 (01/20 0618) SpO2:  [95 %-96 %] 95 % (01/20 0618) Last BM Date: (P) (PTA)  Intake/Output from previous day: 01/19 0701 - 01/20 0700 In: 855 [I.V.:600.2; IV Piggyback:254.8] Out: 3250 [Urine:3250] Intake/Output this shift: No intake/output data recorded.  PE: Gen:  Alert, NAD, pleasant Card:  Regular rate and rhythm Pulm:  Normal effort, clear to auscultation bilaterally Abd: Soft, appropriately ttp, non-distended, incision c/d/i with staples present. NG with minimal, light/clear output.  Skin: warm and dry, no rashes  Psych: A&Ox3   Lab Results:  Recent Labs    06/05/19 0457 06/06/19 0405  WBC 11.6* 11.1*  HGB 12.1 12.4  HCT 38.0 38.8  PLT 355 396   BMET Recent Labs    06/05/19 0457 06/06/19 0405  NA 136 135  K 4.0 3.6  CL 105 102  CO2 21* 24  GLUCOSE 67* 102*  BUN 13 10  CREATININE 0.64 0.68  CALCIUM 7.9* 8.2*   PT/INR No results for input(s): LABPROT, INR in the last 72 hours. CMP     Component Value Date/Time   NA 135 06/06/2019 0405   NA 138 08/21/2012 1002   K 3.6 06/06/2019 0405   K 3.5 08/21/2012 1002   CL 102 06/06/2019 0405   CL 102 08/21/2012 1002   CO2 24 06/06/2019 0405   CO2 28 08/21/2012 1002   GLUCOSE 102 (H) 06/06/2019 0405   GLUCOSE 132 (H) 08/21/2012 1002   BUN 10 06/06/2019 0405   BUN 16.7 08/21/2012 1002   CREATININE 0.68 06/06/2019 0405   CREATININE 0.83 12/03/2013 1112   CREATININE 1.0 08/21/2012 1002   CALCIUM 8.2 (L) 06/06/2019 0405   CALCIUM 8.9 08/21/2012 1002   PROT 8.2 (H) 06/02/2019 0529    PROT 7.6 08/21/2012 1002   ALBUMIN 4.0 06/02/2019 0529   ALBUMIN 3.3 (L) 08/21/2012 1002   AST 17 06/02/2019 0529   AST 10 08/21/2012 1002   ALT 15 06/02/2019 0529   ALT 8 08/21/2012 1002   ALKPHOS 43 06/02/2019 0529   ALKPHOS 60 08/21/2012 1002   BILITOT 0.5 06/02/2019 0529   BILITOT 0.22 08/21/2012 1002   GFRNONAA >60 06/06/2019 0405   GFRNONAA 86 12/03/2013 1112   GFRAA >60 06/06/2019 0405   GFRAA >89 12/03/2013 1112   Lipase     Component Value Date/Time   LIPASE 30 06/02/2019 0529       Studies/Results: No results found.  Anti-infectives: Anti-infectives (From admission, onward)   Start     Dose/Rate Route Frequency Ordered Stop   06/03/19 1029  ceFAZolin (ANCEF) IVPB 2g/100 mL premix     2 g 200 mL/hr over 30 Minutes Intravenous 30 min pre-op 06/03/19 1030 06/03/19 1036   06/03/19 0745  ceFAZolin (ANCEF) powder 2 g  Status:  Discontinued     2 g Other To Surgery 06/03/19 0744 06/03/19 1029       Assessment/Plan HTN HLD GERD T2DM - having some hypoglycemia, has not had any insulin and already on sensitive SSI, change fluids to D5 1/2 NS for now and monitor CBGs  SBO S/p exploratory laparotomy with LOA 06/03/19 Dr. Marlou Starks - POD#3 - DC NG today, ok to have limited ice chips/sips - MOBILIZE!!! Will ask PT to evaluate - replace K with goal 4  FEN: NPO, IVF VTE: SCDs, lovenox ID:  Ancef 1/17  LOS: 4 days    Clovis Riley , [facility] 06/06/2019, 8:08 AM Please see Amion for floor coverage during day hours 7:00am-4:30pm

## 2019-06-07 ENCOUNTER — Telehealth: Payer: Self-pay

## 2019-06-07 LAB — GLUCOSE, CAPILLARY
Glucose-Capillary: 85 mg/dL (ref 70–99)
Glucose-Capillary: 112 mg/dL — ABNORMAL HIGH (ref 70–99)
Glucose-Capillary: 89 mg/dL (ref 70–99)
Glucose-Capillary: 97 mg/dL (ref 70–99)

## 2019-06-07 LAB — BASIC METABOLIC PANEL
Anion gap: 10 (ref 5–15)
BUN: 9 mg/dL (ref 6–20)
CO2: 23 mmol/L (ref 22–32)
Calcium: 8.2 mg/dL — ABNORMAL LOW (ref 8.9–10.3)
Chloride: 101 mmol/L (ref 98–111)
Creatinine, Ser: 0.67 mg/dL (ref 0.44–1.00)
GFR calc Af Amer: >60 mL/min (ref >60)
GFR calc non Af Amer: >60 mL/min (ref >60)
Glucose, Bld: 98 mg/dL (ref 70–99)
Potassium: 4 mmol/L (ref 3.5–5.1)
Sodium: 134 mmol/L — ABNORMAL LOW (ref 135–145)

## 2019-06-07 MED ORDER — OXYCODONE HCL 5 MG PO TABS
5.0000 mg | ORAL_TABLET | Freq: Four times a day (QID) | ORAL | Status: DC | PRN
  Administered 2019-06-09 – 2019-06-11 (×3): 5 mg via ORAL
  Filled 2019-06-07 (×2): qty 1

## 2019-06-07 MED ORDER — HYDROMORPHONE HCL 1 MG/ML IJ SOLN
0.5000 mg | Freq: Four times a day (QID) | INTRAMUSCULAR | Status: DC | PRN
  Administered 2019-06-08 (×2): 1 mg via INTRAVENOUS
  Filled 2019-06-07 (×2): qty 1

## 2019-06-07 MED ORDER — METHOCARBAMOL 1000 MG/10ML IJ SOLN
500.0000 mg | Freq: Four times a day (QID) | INTRAVENOUS | Status: DC | PRN
  Filled 2019-06-07: qty 5

## 2019-06-07 MED ORDER — AMLODIPINE BESYLATE 10 MG PO TABS
10.0000 mg | ORAL_TABLET | Freq: Every day | ORAL | Status: DC
  Administered 2019-06-07 – 2019-06-11 (×5): 10 mg via ORAL
  Filled 2019-06-07 (×5): qty 1

## 2019-06-07 MED ORDER — KETOROLAC TROMETHAMINE 15 MG/ML IJ SOLN
15.0000 mg | Freq: Three times a day (TID) | INTRAMUSCULAR | Status: AC | PRN
  Administered 2019-06-07 – 2019-06-09 (×4): 15 mg via INTRAVENOUS
  Filled 2019-06-07 (×4): qty 1

## 2019-06-07 MED ORDER — METOPROLOL TARTRATE 50 MG PO TABS
100.0000 mg | ORAL_TABLET | Freq: Two times a day (BID) | ORAL | Status: DC
  Administered 2019-06-07 – 2019-06-11 (×9): 100 mg via ORAL
  Filled 2019-06-07 (×9): qty 2

## 2019-06-07 NOTE — Progress Notes (Signed)
Physical Therapy Treatment Patient Details Name: Susan Whitaker MRN: [id number] DOB: 24-Jul-1970 Today's Date: 06/07/2019    History of Present Illness 49 y/o female s/p exploratory lap with lysis of adhesions on 06/03/19. PMH includes HTN and diabetes    PT Comments    Pt making good progress with PT today. Family would prefer pt to ambulate in room, but did 250' through laps with RW.  Gait still slow, but improved pattern and cadence. Con't to recommend HHPT, RW, and 3-1 BSC. Her eldest daughter was present again and she has good family support.    Follow Up Recommendations  Home health PT;Supervision for mobility/OOB     Equipment Recommendations  Rolling walker with 5" wheels;3in1 (PT)    Recommendations for Other Services       Precautions / Restrictions Precautions Precautions: Fall Restrictions Weight Bearing Restrictions: No    Mobility  Bed Mobility               General bed mobility comments: sitting up in recliner upon arrival  Transfers Overall transfer level: Needs assistance Equipment used: Rolling walker (2 wheeled) Transfers: Sit to/from Stand Sit to Stand: Min guard         General transfer comment: pt needing cues for hand placement. Did not recall instructions from yesterday.  Ambulation/Gait Ambulation/Gait assistance: Min guard Gait Distance (Feet): 250 Feet Assistive device: Rolling walker (2 wheeled) Gait Pattern/deviations: Step-to pattern;Step-through pattern;Decreased step length - right;Decreased step length - left Gait velocity: decreased   General Gait Details: Pt ambulating slowly at first with increased step length compared to yesterday, but still shortened.  Facilitated improved gait pattern with step through gait and more fluid movement of RW. Ambulated 7 laps in room, as daughter is concerned about COVID.   Stairs             Wheelchair Mobility    Modified Rankin (Stroke Patients Only)       Balance                                            Cognition Arousal/Alertness: Awake/alert Behavior During Therapy: WFL for tasks assessed/performed Overall Cognitive Status: Within Functional Limits for tasks assessed                                        Exercises      General Comments General comments (skin integrity, edema, etc.): Reviewed use of pillow for bracing      Pertinent Vitals/Pain Pain Assessment: Faces Faces Pain Scale: Hurts a little bit Pain Location: Abd pain Pain Descriptors / Indicators: Sore Pain Intervention(s): Limited activity within patient's tolerance;Repositioned    Home Living                      Prior Function            PT Goals (current goals can now be found in the care plan section) Acute Rehab PT Goals Patient Stated Goal: go home with family support Potential to Achieve Goals: Good Progress towards PT goals: Progressing toward goals    Frequency    Min 3X/week      PT Plan Current plan remains appropriate    Co-evaluation  AM-PAC PT "6 Clicks" Mobility   Outcome Measure  Help needed turning from your back to your side while in a flat bed without using bedrails?: None Help needed moving from lying on your back to sitting on the side of a flat bed without using bedrails?: A Little Help needed moving to and from a bed to a chair (including a wheelchair)?: A Little Help needed standing up from a chair using your arms (e.g., wheelchair or bedside chair)?: A Little Help needed to walk in hospital room?: A Little Help needed climbing 3-5 steps with a railing? : A Little 6 Click Score: 19    End of Session Equipment Utilized During Treatment: Gait belt Activity Tolerance: Patient tolerated treatment well Patient left: in chair;with call bell/phone within reach;with family/visitor present Nurse Communication: Mobility status PT Visit Diagnosis: Difficulty in walking, not elsewhere  classified (R26.2)     Time: 1914-7829 PT Time Calculation (min) (ACUTE ONLY): 30 min  Charges:  $Gait Training: 8-22 mins $Therapeutic Activity: 8-22 mins                     Aliece Honold L. Katrinka Blazing, [place] Pager [phone] 06/07/2019    Enzo Montgomery 06/07/2019, 12:14 PM

## 2019-06-07 NOTE — Progress Notes (Addendum)
[facility] Progress Note  4 Days Post-Op  Subjective: CC: pain Slept well. Pain controlled. No nausea or pain s/p NG removal yesterday morning. Continues to pass flatus but no BM. Walked in halls yesterday.   Objective: Vital signs in last 24 hours: Temp:  [98 F (36.7 C)-99 F (37.2 C)] 99 F (37.2 C) (01/21 0602) Pulse Rate:  [61-63] 61 (01/21 0602) Resp:  [16] 16 (01/21 0602) BP: (145-150)/(76-82) 145/76 (01/21 0602) SpO2:  [96 %-98 %] 98 % (01/21 0602) Last BM Date: (PTA)  Intake/Output from previous day: 01/20 0701 - 01/21 0700 In: 2193.8 [I.V.:1755.7; IV Piggyback:438.1] Out: 1100 [Urine:1100] Intake/Output this shift: No intake/output data recorded.  PE: Gen:  Alert, NAD, pleasant Card:  Regular rate and rhythm Pulm:  Normal effort, clear to auscultation bilaterally Abd: Soft, minimally ttp (improved), non-distended, incision c/d/i with staples present.  Skin: warm and dry, no rashes  Psych: A&Ox3   Lab Results:  Recent Labs    06/05/19 0457 06/06/19 0405  WBC 11.6* 11.1*  HGB 12.1 12.4  HCT 38.0 38.8  PLT 355 396   BMET Recent Labs    06/06/19 0405 06/07/19 0541  NA 135 134*  K 3.6 4.0  CL 102 101  CO2 24 23  GLUCOSE 102* 98  BUN 10 9  CREATININE 0.68 0.67  CALCIUM 8.2* 8.2*   PT/INR No results for input(s): LABPROT, INR in the last 72 hours. CMP     Component Value Date/Time   NA 134 (L) 06/07/2019 0541   NA 138 08/21/2012 1002   K 4.0 06/07/2019 0541   K 3.5 08/21/2012 1002   CL 101 06/07/2019 0541   CL 102 08/21/2012 1002   CO2 23 06/07/2019 0541   CO2 28 08/21/2012 1002   GLUCOSE 98 06/07/2019 0541   GLUCOSE 132 (H) 08/21/2012 1002   BUN 9 06/07/2019 0541   BUN 16.7 08/21/2012 1002   CREATININE 0.67 06/07/2019 0541   CREATININE 0.83 12/03/2013 1112   CREATININE 1.0 08/21/2012 1002   CALCIUM 8.2 (L) 06/07/2019 0541   CALCIUM 8.9 08/21/2012 1002   PROT 8.2 (H) 06/02/2019 0529   PROT 7.6 08/21/2012 1002   ALBUMIN 4.0 06/02/2019 0529   ALBUMIN 3.3 (L) 08/21/2012 1002   AST 17 06/02/2019 0529   AST 10 08/21/2012 1002   ALT 15 06/02/2019 0529   ALT 8 08/21/2012 1002   ALKPHOS 43 06/02/2019 0529   ALKPHOS 60 08/21/2012 1002   BILITOT 0.5 06/02/2019 0529   BILITOT 0.22 08/21/2012 1002   GFRNONAA >60 06/07/2019 0541   GFRNONAA 86 12/03/2013 1112   GFRAA >60 06/07/2019 0541   GFRAA >89 12/03/2013 1112   Lipase     Component Value Date/Time   LIPASE 30 06/02/2019 0529       Studies/Results: No results found.  Anti-infectives: Anti-infectives (From admission, onward)   Start     Dose/Rate Route Frequency Ordered Stop   06/03/19 1029  ceFAZolin (ANCEF) IVPB 2g/100 mL premix     2 g 200 mL/hr over 30 Minutes Intravenous 30 min pre-op 06/03/19 1030 06/03/19 1036   06/03/19 0745  ceFAZolin (ANCEF) powder 2 g  Status:  Discontinued     2 g Other To Surgery 06/03/19 0744 06/03/19 1029       Assessment/Plan HTN- restart some of her home meds today (norvasc and lopressor, hold off on losartan and spironolactone) HLD GERD T2DM - has not required insulin; monitor CBGs  SBO S/p exploratory laparotomy with LOA  06/03/19 Dr. Marlou Starks - POD#4 - Ferrel Logan of clears today - Continue ambulating in halls as much as possible. -Continue weaning pain regimen. Reduce frequency of available oxycodone and dilaudid. Change robaxin and toradol to PRN. Continue scheduled tylenol/gabapentin.    FEN: sips of clears, reduce IVF VTE: SCDs, lovenox ID:  Ancef 1/17  LOS: 5 days    Clovis Riley , MD [facility] 06/07/2019, 7:23 AM Please see Amion for floor coverage during day hours 7:00am-4:30pm

## 2019-06-07 NOTE — Telephone Encounter (Signed)
Patient called in to cancel appointment for her in office appoint for her 2nd Shingles Shot. Patient wants to know if she can get her 2nd shingle shot while she is in the hospital. The patient is at [facility] please follow up with the patient daughter Mrs. Meltzer at [phone] to let her know if its ok.

## 2019-06-08 LAB — GLUCOSE, CAPILLARY
Glucose-Capillary: 112 mg/dL — ABNORMAL HIGH (ref 70–99)
Glucose-Capillary: 96 mg/dL (ref 70–99)
Glucose-Capillary: 108 mg/dL — ABNORMAL HIGH (ref 70–99)
Glucose-Capillary: 125 mg/dL — ABNORMAL HIGH (ref 70–99)

## 2019-06-08 MED ORDER — LOSARTAN POTASSIUM 50 MG PO TABS
100.0000 mg | ORAL_TABLET | Freq: Every day | ORAL | Status: DC
  Administered 2019-06-08 – 2019-06-11 (×4): 100 mg via ORAL
  Filled 2019-06-08 (×4): qty 2

## 2019-06-08 MED ORDER — SODIUM CHLORIDE 0.9% FLUSH
3.0000 mL | Freq: Two times a day (BID) | INTRAVENOUS | Status: DC
  Administered 2019-06-08 – 2019-06-10 (×6): 3 mL via INTRAVENOUS

## 2019-06-08 MED ORDER — ACETAMINOPHEN 500 MG PO TABS
1000.0000 mg | ORAL_TABLET | Freq: Four times a day (QID) | ORAL | Status: DC
  Administered 2019-06-08 – 2019-06-11 (×9): 1000 mg via ORAL
  Filled 2019-06-08 (×11): qty 2

## 2019-06-08 MED ORDER — METOCLOPRAMIDE HCL 5 MG/ML IJ SOLN
10.0000 mg | Freq: Four times a day (QID) | INTRAMUSCULAR | Status: DC
  Administered 2019-06-08 – 2019-06-11 (×10): 10 mg via INTRAVENOUS
  Filled 2019-06-08 (×10): qty 2

## 2019-06-08 MED ORDER — SODIUM CHLORIDE 0.9 % IV SOLN: 250.0000 mL | INTRAVENOUS | Status: DC | PRN

## 2019-06-08 MED ORDER — METHOCARBAMOL 500 MG PO TABS
1000.0000 mg | ORAL_TABLET | Freq: Three times a day (TID) | ORAL | Status: DC
  Administered 2019-06-08 – 2019-06-11 (×10): 1000 mg via ORAL
  Filled 2019-06-08 (×10): qty 2

## 2019-06-08 MED ORDER — HYDROMORPHONE HCL 1 MG/ML IJ SOLN: 0.5000 mg | Freq: Four times a day (QID) | INTRAMUSCULAR | Status: DC | PRN

## 2019-06-08 MED ORDER — SODIUM CHLORIDE 0.9% FLUSH: 3.0000 mL | INTRAVENOUS | Status: DC | PRN

## 2019-06-08 MED ORDER — BISACODYL 10 MG RE SUPP
10.0000 mg | Freq: Once | RECTAL | Status: AC
  Administered 2019-06-08: 10:00:00 10 mg via RECTAL
  Filled 2019-06-08: qty 1

## 2019-06-08 NOTE — Progress Notes (Signed)
[facility] Progress Note  5 Days Post-Op  Subjective: CC: pain Patient reports pain overnight. Stabbing/burning pain around incision. Some mild nausea, but no emesis or belching. +flatus.   Objective: Vital signs in last 24 hours: Temp:  [98.5 F (36.9 C)-98.9 F (37.2 C)] 98.5 F (36.9 C) (01/22 0643) Pulse Rate:  [70-72] 70 (01/22 0643) Resp:  [16-17] 17 (01/22 0643) BP: (131-150)/(70-89) 131/70 (01/22 0643) SpO2:  [97 %-98 %] 97 % (01/22 0643) Last BM Date: (PTA)  Intake/Output from previous day: 01/21 0701 - 01/22 0700 In: 1600 [P.O.:1600] Out: 1200 [Urine:1200] Intake/Output this shift: No intake/output data recorded.  PE: Gen:  Alert, NAD, pleasant Card:  Regular rate and rhythm, pedal pulses 2+ BL Pulm:  Normal effort, clear to auscultation bilaterally Abd: Soft, appropriately ttp along incision, non-distended, +BS, incision c/d/i with staples present Skin: warm and dry, no rashes  Psych: A&Ox3   Lab Results:  Recent Labs    06/06/19 0405  WBC 11.1*  HGB 12.4  HCT 38.8  PLT 396   BMET Recent Labs    06/06/19 0405 06/07/19 0541  NA 135 134*  K 3.6 4.0  CL 102 101  CO2 24 23  GLUCOSE 102* 98  BUN 10 9  CREATININE 0.68 0.67  CALCIUM 8.2* 8.2*   PT/INR No results for input(s): LABPROT, INR in the last 72 hours. CMP     Component Value Date/Time   NA 134 (L) 06/07/2019 0541   NA 138 08/21/2012 1002   K 4.0 06/07/2019 0541   K 3.5 08/21/2012 1002   CL 101 06/07/2019 0541   CL 102 08/21/2012 1002   CO2 23 06/07/2019 0541   CO2 28 08/21/2012 1002   GLUCOSE 98 06/07/2019 0541   GLUCOSE 132 (H) 08/21/2012 1002   BUN 9 06/07/2019 0541   BUN 16.7 08/21/2012 1002   CREATININE 0.67 06/07/2019 0541   CREATININE 0.83 12/03/2013 1112   CREATININE 1.0 08/21/2012 1002   CALCIUM 8.2 (L) 06/07/2019 0541   CALCIUM 8.9 08/21/2012 1002   PROT 8.2 (H) 06/02/2019 0529   PROT 7.6 08/21/2012 1002   ALBUMIN 4.0 06/02/2019 0529   ALBUMIN 3.3  (L) 08/21/2012 1002   AST 17 06/02/2019 0529   AST 10 08/21/2012 1002   ALT 15 06/02/2019 0529   ALT 8 08/21/2012 1002   ALKPHOS 43 06/02/2019 0529   ALKPHOS 60 08/21/2012 1002   BILITOT 0.5 06/02/2019 0529   BILITOT 0.22 08/21/2012 1002   GFRNONAA >60 06/07/2019 0541   GFRNONAA 86 12/03/2013 1112   GFRAA >60 06/07/2019 0541   GFRAA >89 12/03/2013 1112   Lipase     Component Value Date/Time   LIPASE 30 06/02/2019 0529       Studies/Results: No results found.  Anti-infectives: Anti-infectives (From admission, onward)   Start     Dose/Rate Route Frequency Ordered Stop   06/03/19 1029  ceFAZolin (ANCEF) IVPB 2g/100 mL premix     2 g 200 mL/hr over 30 Minutes Intravenous 30 min pre-op 06/03/19 1030 06/03/19 1036   06/03/19 0745  ceFAZolin (ANCEF) powder 2 g  Status:  Discontinued     2 g Other To Surgery 06/03/19 0744 06/03/19 1029       Assessment/Plan HTN HLD GERD T2DM - continue to monitor CBGs  SBO S/p exploratory laparotomy with LOA 06/03/19 Dr. Marlou Starks - POD#5 - advance to FLD - MOBILIZE!!! - increased scheduled tylenol, changed robaxin to PO and scheduled - ok for patient to shower  FEN: FLD, SLIV VTE: SCDs, lovenox ID:  Ancef 1/17  LOS: 6 days    Brigid Re , [facility] 06/08/2019, 8:07 AM Please see Amion for pager number during day hours 7:00am-4:30pm

## 2019-06-08 NOTE — Telephone Encounter (Signed)
Patient in hospital after emergency surgery. Advise patient she can schedule her shingrix #2 when she comes out. She had the first one in November, I told her she has until May

## 2019-06-08 NOTE — Progress Notes (Signed)
Physical Therapy Treatment Patient Details Name: Susan Whitaker MRN: [id number] DOB: Nov 17, 1970 Today's Date: 06/08/2019    History of Present Illness 49 y/o female s/p exploratory lap with lysis of adhesions on 06/03/19. PMH includes HTN and diabetes    PT Comments    Assisted OOB to amb and practiced stairs.  Ensured hallway was clear of other as pt is reluctant to amb outside her room due to [place] scare.  General Gait Details: tolerated a functional distance using RW lightly due to recent ABD surgery.  Slight forward flexion/guarding.General stair comments: instructed to use one rail stepping sideways with caution to avoid pulling up on rail with ascend.  Also instructed not to carry anything (esp walker) up/down stairs.  "BOTH hands on one rail" to secure self. Pt safely navigated 3 steps.  Assisted back to room and back to bed.  Pt plans to D/C back home with help from daughter.  Pt will need a walker for home use.   Follow Up Recommendations  Home health PT;Supervision for mobility/OOB     Equipment Recommendations  Rolling walker with 5" wheels;3in1 (PT)    Recommendations for Other Services       Precautions / Restrictions Precautions Precautions: Fall Precaution Comments: recent ABD surgery Restrictions Weight Bearing Restrictions: No    Mobility  Bed Mobility Overal bed mobility: Modified Independent Bed Mobility: Rolling;Sidelying to Sit;Sit to Sidelying           General bed mobility comments: pt self able to sidelying to sit and  back to bed with increased time  Transfers Overall transfer level: Needs assistance Equipment used: Rolling walker (2 wheeled) Transfers: Sit to/from Stand Sit to Stand: Supervision;Min guard         General transfer comment: 255 VC's on proper hand placement and safety with turns  Ambulation/Gait Ambulation/Gait assistance: Supervision Gait Distance (Feet): 185 Feet(in hallway) Assistive device: Rolling walker (2  wheeled) Gait Pattern/deviations: Step-to pattern;Step-through pattern;Decreased step length - right;Decreased step length - left Gait velocity: decreased   General Gait Details: tolerated a functional distance using RW lightly due to recent ABD surgery.  Slight forward flexion/guarding.   Stairs Stairs: Yes Stairs assistance: Min guard Stair Management: One rail Right;Sideways Number of Stairs: 3 General stair comments: instructed to use one rail stepping sideways with caution to avoid pulling up on rail with ascend.  Also instructed not to carry anything (esp walker) up/down stairs.  "BOTH hands on one rail" to secure self.   Wheelchair Mobility    Modified Rankin (Stroke Patients Only)       Balance                                            Cognition Arousal/Alertness: Awake/alert Behavior During Therapy: WFL for tasks assessed/performed Overall Cognitive Status: Within Functional Limits for tasks assessed                                        Exercises      General Comments        Pertinent Vitals/Pain Pain Assessment: 0-10 Faces Pain Scale: Hurts little more Pain Location: Abd pain with activity Pain Descriptors / Indicators: Sharp;Cramping;Discomfort;Tender Pain Intervention(s): Monitored during session;Premedicated before session;Repositioned    Home Living  Prior Function            PT Goals (current goals can now be found in the care plan section) Progress towards PT goals: Progressing toward goals    Frequency    Min 3X/week      PT Plan Current plan remains appropriate    Co-evaluation              AM-PAC PT "6 Clicks" Mobility   Outcome Measure  Help needed turning from your back to your side while in a flat bed without using bedrails?: None Help needed moving from lying on your back to sitting on the side of a flat bed without using bedrails?: A Little Help  needed moving to and from a bed to a chair (including a wheelchair)?: A Little Help needed standing up from a chair using your arms (e.g., wheelchair or bedside chair)?: A Little Help needed to walk in hospital room?: A Little Help needed climbing 3-5 steps with a railing? : A Little 6 Click Score: 19    End of Session Equipment Utilized During Treatment: Gait belt Activity Tolerance: Patient tolerated treatment well Patient left: in bed;with call bell/phone within reach Nurse Communication: Mobility status PT Visit Diagnosis: Difficulty in walking, not elsewhere classified (R26.2)     Time: 1010-1035 PT Time Calculation (min) (ACUTE ONLY): 25 min  Charges:  $Gait Training: 8-22 mins $Therapeutic Activity: 8-22 mins                     Rica Koyanagi  PTA Acute  Rehabilitation Services Pager      [phone]  Office      [phone]

## 2019-06-09 LAB — GLUCOSE, CAPILLARY
Glucose-Capillary: 96 mg/dL (ref 70–99)
Glucose-Capillary: 145 mg/dL — ABNORMAL HIGH (ref 70–99)
Glucose-Capillary: 164 mg/dL — ABNORMAL HIGH (ref 70–99)
Glucose-Capillary: 68 mg/dL — ABNORMAL LOW (ref 70–99)
Glucose-Capillary: 94 mg/dL (ref 70–99)

## 2019-06-09 MED ORDER — POLYETHYLENE GLYCOL 3350 17 G PO PACK
17.0000 g | PACK | Freq: Every day | ORAL | Status: DC
  Administered 2019-06-09 – 2019-06-11 (×3): 17 g via ORAL
  Filled 2019-06-09 (×3): qty 1

## 2019-06-09 MED ORDER — BISACODYL 10 MG RE SUPP: 10.0000 mg | Freq: Every day | RECTAL | Status: DC | PRN

## 2019-06-09 MED ORDER — BOOST / RESOURCE BREEZE PO LIQD CUSTOM
1.0000 | Freq: Three times a day (TID) | ORAL | Status: DC
  Administered 2019-06-09 (×2): 1 via ORAL

## 2019-06-09 NOTE — Progress Notes (Signed)
[facility] Progress Note  6 Days Post-Op  Subjective: CC: pain No further emesis, tolerated full liquids yesterday.  Continues to pass flatus but no bowel movement yet.  Walking a bit more.  In good spirits this morning.  Objective: Vital signs in last 24 hours: Temp:  [98.1 F (36.7 C)-99.1 F (37.3 C)] 99.1 F (37.3 C) (01/23 0637) Pulse Rate:  [60-63] 62 (01/23 0637) Resp:  [15-17] 17 (01/23 0637) BP: (112-146)/(62-82) 146/82 (01/23 0637) SpO2:  [95 %] 95 % (01/23 0637) Last BM Date: (PTA)  Intake/Output from previous day: 01/22 0701 - 01/23 0700 In: 480 [P.O.:480] Out: 1200 [Urine:1200] Intake/Output this shift: No intake/output data recorded.  PE: Gen:  Alert, NAD, pleasant Card:  Regular rate and rhythm Pulm:  Normal effort Abd: Soft, appropriately ttp along incision, non-distended, +BS, incision c/d/i with staples present Skin: warm and dry, no rashes  Psych: A&Ox3   Lab Results:  No results for input(s): WBC, HGB, HCT, PLT in the last 72 hours. BMET Recent Labs    06/07/19 0541  NA 134*  K 4.0  CL 101  CO2 23  GLUCOSE 98  BUN 9  CREATININE 0.67  CALCIUM 8.2*   PT/INR No results for input(s): LABPROT, INR in the last 72 hours. CMP     Component Value Date/Time   NA 134 (L) 06/07/2019 0541   NA 138 08/21/2012 1002   K 4.0 06/07/2019 0541   K 3.5 08/21/2012 1002   CL 101 06/07/2019 0541   CL 102 08/21/2012 1002   CO2 23 06/07/2019 0541   CO2 28 08/21/2012 1002   GLUCOSE 98 06/07/2019 0541   GLUCOSE 132 (H) 08/21/2012 1002   BUN 9 06/07/2019 0541   BUN 16.7 08/21/2012 1002   CREATININE 0.67 06/07/2019 0541   CREATININE 0.83 12/03/2013 1112   CREATININE 1.0 08/21/2012 1002   CALCIUM 8.2 (L) 06/07/2019 0541   CALCIUM 8.9 08/21/2012 1002   PROT 8.2 (H) 06/02/2019 0529   PROT 7.6 08/21/2012 1002   ALBUMIN 4.0 06/02/2019 0529   ALBUMIN 3.3 (L) 08/21/2012 1002   AST 17 06/02/2019 0529   AST 10 08/21/2012 1002   ALT 15  06/02/2019 0529   ALT 8 08/21/2012 1002   ALKPHOS 43 06/02/2019 0529   ALKPHOS 60 08/21/2012 1002   BILITOT 0.5 06/02/2019 0529   BILITOT 0.22 08/21/2012 1002   GFRNONAA >60 06/07/2019 0541   GFRNONAA 86 12/03/2013 1112   GFRAA >60 06/07/2019 0541   GFRAA >89 12/03/2013 1112   Lipase     Component Value Date/Time   LIPASE 30 06/02/2019 0529       Studies/Results: No results found.  Anti-infectives: Anti-infectives (From admission, onward)   Start     Dose/Rate Route Frequency Ordered Stop   06/03/19 1029  ceFAZolin (ANCEF) IVPB 2g/100 mL premix     2 g 200 mL/hr over 30 Minutes Intravenous 30 min pre-op 06/03/19 1030 06/03/19 1036   06/03/19 0745  ceFAZolin (ANCEF) powder 2 g  Status:  Discontinued     2 g Other To Surgery 06/03/19 0744 06/03/19 1029       Assessment/Plan HTN HLD GERD T2DM - continue to monitor CBGs  SBO S/p exploratory laparotomy with LOA 06/03/19 Dr. Marlou Starks - POD#6 -Continue full liquid diet, await bowel movement - MOBILIZE!!! -Continue current pain regimen - ok for patient to shower  FEN: FLD, SLIV VTE: SCDs, lovenox ID:  Ancef 1/17  LOS: 7 days    Susan Whitaker A  Kae Heller , [facility] 06/09/2019, 8:57 AM Please see Amion for pager number during day hours 7:00am-4:30pm

## 2019-06-09 NOTE — Progress Notes (Signed)
Hypoglycemic Event  CBG: 68  Treatment:2 4 oz orange juices Symptoms: none Follow-up CBG: Time:2147  CBG Result:94  Possible Reasons for Event: not eating much  Comments/MD notified:N/A protocol followed.     Roderick Pee

## 2019-06-10 LAB — BASIC METABOLIC PANEL
Anion gap: 7 (ref 5–15)
BUN: 14 mg/dL (ref 6–20)
CO2: 29 mmol/L (ref 22–32)
Calcium: 8.4 mg/dL — ABNORMAL LOW (ref 8.9–10.3)
Chloride: 102 mmol/L (ref 98–111)
Creatinine, Ser: 0.88 mg/dL (ref 0.44–1.00)
GFR calc Af Amer: >60 mL/min (ref >60)
GFR calc non Af Amer: >60 mL/min (ref >60)
Glucose, Bld: 105 mg/dL — ABNORMAL HIGH (ref 70–99)
Potassium: 3.3 mmol/L — ABNORMAL LOW (ref 3.5–5.1)
Sodium: 138 mmol/L (ref 135–145)

## 2019-06-10 LAB — CBC
HCT: 38.2 % (ref 36.0–46.0)
Hemoglobin: 12.3 g/dL (ref 12.0–15.0)
MCH: 29.4 pg (ref 26.0–34.0)
MCHC: 32.2 g/dL (ref 30.0–36.0)
MCV: 91.2 fL (ref 80.0–100.0)
Platelets: 491 10*3/uL — ABNORMAL HIGH (ref 150–400)
RBC: 4.19 MIL/uL (ref 3.87–5.11)
RDW: 13.4 % (ref 11.5–15.5)
WBC: 8 10*3/uL (ref 4.0–10.5)
nRBC: 0 % (ref 0.0–0.2)

## 2019-06-10 LAB — GLUCOSE, CAPILLARY
Glucose-Capillary: 84 mg/dL (ref 70–99)
Glucose-Capillary: 92 mg/dL (ref 70–99)
Glucose-Capillary: 109 mg/dL — ABNORMAL HIGH (ref 70–99)
Glucose-Capillary: 131 mg/dL — ABNORMAL HIGH (ref 70–99)

## 2019-06-10 LAB — MAGNESIUM: Magnesium: 2.2 mg/dL (ref 1.7–2.4)

## 2019-06-10 MED ORDER — POTASSIUM CHLORIDE 10 MEQ/100ML IV SOLN
10.0000 meq | INTRAVENOUS | Status: AC
  Administered 2019-06-10 (×2): 10 meq via INTRAVENOUS
  Filled 2019-06-10 (×2): qty 100

## 2019-06-10 MED ORDER — POTASSIUM CHLORIDE 20 MEQ PO PACK
40.0000 meq | PACK | Freq: Once | ORAL | Status: AC
  Administered 2019-06-10: 40 meq via ORAL
  Filled 2019-06-10: qty 2

## 2019-06-10 MED ORDER — POTASSIUM CHLORIDE 10 MEQ/100ML IV SOLN
INTRAVENOUS | Status: AC
  Administered 2019-06-10: 10 meq via INTRAVENOUS
  Filled 2019-06-10: qty 100

## 2019-06-10 NOTE — Progress Notes (Addendum)
[facility] Progress Note  7 Days Post-Op  Subjective: CC: pain Feeling well this morning.  Pain well controlled.  Tolerating full liquids without nausea, bloating or emesis.  Continues to pass flatus but no bowel movement yet.  Objective: Vital signs in last 24 hours: Temp:  [98.3 F (36.8 C)-99.3 F (37.4 C)] 98.3 F (36.8 C) (01/24 0552) Pulse Rate:  [56-61] 56 (01/24 0552) Resp:  [16-17] 17 (01/24 0552) BP: (118-124)/(72-80) 122/80 (01/24 0552) SpO2:  [95 %-96 %] 96 % (01/24 0552) Last BM Date: (PTA)  Intake/Output from previous day: 01/23 0701 - 01/24 0700 In: 480 [P.O.:480] Out: 700 [Urine:700] Intake/Output this shift: No intake/output data recorded.  PE: Gen:  Alert, NAD, pleasant Card:  Regular rate and rhythm Pulm:  Normal effort Abd: Soft, minimal tenderness, non-distended, incision c/d/i with staples present Skin: warm and dry, no rashes  Psych: A&Ox3   Lab Results:  Recent Labs    06/10/19 0521  WBC 8.0  HGB 12.3  HCT 38.2  PLT 491*   BMET Recent Labs    06/10/19 0521  NA 138  K 3.3*  CL 102  CO2 29  GLUCOSE 105*  BUN 14  CREATININE 0.88  CALCIUM 8.4*   PT/INR No results for input(s): LABPROT, INR in the last 72 hours. CMP     Component Value Date/Time   NA 138 06/10/2019 0521   NA 138 08/21/2012 1002   K 3.3 (L) 06/10/2019 0521   K 3.5 08/21/2012 1002   CL 102 06/10/2019 0521   CL 102 08/21/2012 1002   CO2 29 06/10/2019 0521   CO2 28 08/21/2012 1002   GLUCOSE 105 (H) 06/10/2019 0521   GLUCOSE 132 (H) 08/21/2012 1002   BUN 14 06/10/2019 0521   BUN 16.7 08/21/2012 1002   CREATININE 0.88 06/10/2019 0521   CREATININE 0.83 12/03/2013 1112   CREATININE 1.0 08/21/2012 1002   CALCIUM 8.4 (L) 06/10/2019 0521   CALCIUM 8.9 08/21/2012 1002   PROT 8.2 (H) 06/02/2019 0529   PROT 7.6 08/21/2012 1002   ALBUMIN 4.0 06/02/2019 0529   ALBUMIN 3.3 (L) 08/21/2012 1002   AST 17 06/02/2019 0529   AST 10 08/21/2012 1002   ALT 15  06/02/2019 0529   ALT 8 08/21/2012 1002   ALKPHOS 43 06/02/2019 0529   ALKPHOS 60 08/21/2012 1002   BILITOT 0.5 06/02/2019 0529   BILITOT 0.22 08/21/2012 1002   GFRNONAA >60 06/10/2019 0521   GFRNONAA 86 12/03/2013 1112   GFRAA >60 06/10/2019 0521   GFRAA >89 12/03/2013 1112   Lipase     Component Value Date/Time   LIPASE 30 06/02/2019 0529       Studies/Results: No results found.  Anti-infectives: Anti-infectives (From admission, onward)   Start     Dose/Rate Route Frequency Ordered Stop   06/03/19 1029  ceFAZolin (ANCEF) IVPB 2g/100 mL premix     2 g 200 mL/hr over 30 Minutes Intravenous 30 min pre-op 06/03/19 1030 06/03/19 1036   06/03/19 0745  ceFAZolin (ANCEF) powder 2 g  Status:  Discontinued     2 g Other To Surgery 06/03/19 0744 06/03/19 1029       Assessment/Plan HTN HLD GERD T2DM - continue to monitor CBGs  SBO S/p exploratory laparotomy with LOA 06/03/19 Dr. Marlou Starks - POD#7 -Advance to soft diet today - MOBILIZE!!! -Continue current pain regimen - ok for patient to shower -Hypokalemia 3.3-replace IV and p.o.  She is essentially ready for discharge as soon as she  has a bowel movement.  FEN: FLD, SLIV VTE: SCDs, lovenox ID:  Ancef 1/17  LOS: 8 days    Clovis Riley , [facility] 06/10/2019, 7:20 AM Please see Amion for pager number during day hours 7:00am-4:30pm

## 2019-06-11 ENCOUNTER — Telehealth: Payer: Self-pay

## 2019-06-11 LAB — GLUCOSE, CAPILLARY: Glucose-Capillary: 95 mg/dL (ref 70–99)

## 2019-06-11 MED ORDER — OXYCODONE HCL 5 MG PO TABS: 5.0000 mg | ORAL_TABLET | Freq: Four times a day (QID) | ORAL | 0 refills | Status: DC | PRN

## 2019-06-11 MED ORDER — DOCUSATE SODIUM 100 MG PO CAPS: 100.0000 mg | ORAL_CAPSULE | Freq: Every day | ORAL | Status: DC | PRN

## 2019-06-11 MED ORDER — METHOCARBAMOL 500 MG PO TABS: 1000.0000 mg | ORAL_TABLET | Freq: Three times a day (TID) | ORAL | 0 refills | Status: DC | PRN

## 2019-06-11 MED ORDER — GABAPENTIN 300 MG PO CAPS: 300.0000 mg | ORAL_CAPSULE | Freq: Three times a day (TID) | ORAL | 0 refills | Status: DC

## 2019-06-11 MED ORDER — POLYETHYLENE GLYCOL 3350 17 G PO PACK: 17.0000 g | PACK | Freq: Every day | ORAL | Status: DC | PRN

## 2019-06-11 MED ORDER — ACETAMINOPHEN 500 MG PO TABS: 1000.0000 mg | ORAL_TABLET | Freq: Four times a day (QID) | ORAL | Status: DC | PRN

## 2019-06-11 NOTE — Telephone Encounter (Signed)
Patient was calling in to know when is it ok for her  To start taking all of her regular meds. Patient just got home from the Hospital today 06/11/2019 at 12pm noon. Please follow up with the patient as soon as possible at [phone] or at [phone]

## 2019-06-11 NOTE — Discharge Summary (Signed)
[facility] Discharge Summary   Patient ID: Susan Whitaker MRN: [id number] DOB/AGE: [birth year] 49 y.o.  Admit date: 06/02/2019 Discharge date: 06/11/2019  Admitting Diagnosis: Small bowel obstruction  Discharge Diagnosis Small bowel obstruction   Consultants None   Imaging: No results found.  Procedures Dr. Marlou Starks (06/03/19) - exploratory laparotomy with [facility] Course:  Patient is a 49 year old female who presented to [facility] with abdominal pain.  Workup showed sbo.  Patient was admitted and NGT inserted for decompression. Patient continued to have pain despite decompression and underwent procedure listed above.  Tolerated procedure well and was transferred to the floor. Patient developed mild post-operative ileus but was improving by POD#4 Diet was advanced as tolerated.  On POD#8, the patient was voiding well, tolerating diet, ambulating well, pain well controlled, vital signs stable, incisions c/d/i and felt stable for discharge home.  Patient will follow up in our office in 1 weeks and knows to call with questions or concerns. She will call to confirm appointment date/time.    Physical Exam: General:  Alert, NAD, pleasant, comfortable Abd:  Soft, ND, mild tenderness, incisions C/D/I  I have personally looked this patient up in the [employer] Controlled Substance Database and reviewed their medications.   Allergies as of 06/11/2019      Reactions   Lisinopril Cough      Medication List    STOP taking these medications   ondansetron 4 MG tablet Commonly known as: Zofran     TAKE these medications   acetaminophen 500 MG tablet Commonly known as: TYLENOL Take 2 tablets (1,000 mg total) by mouth every 6 (six) hours as needed for mild pain or headache.   AMBULATORY NON FORMULARY MEDICATION BORIC ACID CAPSULE 600mg . Insert 1 capsule vaginally two times a week.   amLODipine 10 MG tablet Commonly known as: NORVASC Take 1 tablet (10 mg total) by mouth daily.    aspirin EC 81 MG tablet Take 1 tablet (81 mg total) by mouth daily.   atorvastatin 20 MG tablet Commonly known as: LIPITOR Take 1 tablet (20 mg total) by mouth daily. What changed: when to take this   Boric Acid Powd 600mg  in capsule inserted vaginally once nightly as needed. What changed:   how much to take  how to take this  when to take this  reasons to take this  additional instructions   canagliflozin 300 MG Tabs tablet Commonly known as: Invokana Take 1 tablet (300 mg total) by mouth daily before breakfast.   docusate sodium 100 MG capsule Commonly known as: COLACE Take 1 capsule (100 mg total) by mouth daily as needed for mild constipation.   fluconazole 150 MG tablet Commonly known as: DIFLUCAN TAKE ONE TABLET BY MOUTH AS A ONE-TIME DOSE MAY  REPEAT  IN  3  DAYS  IF  SYMPTOMS  NOT  IMPROVED What changed:   how much to take  how to take this  when to take this  additional instructions   gabapentin 300 MG capsule Commonly known as: NEURONTIN Take 1 capsule (300 mg total) by mouth 3 (three) times daily.   glucose blood test strip Commonly known as: Accu-Chek Guide 1 each by Other route 3 (three) times daily. Use as instructed to check once daily.   losartan 100 MG tablet Commonly known as: COZAAR Take 1 tablet (100 mg total) by mouth daily.   methocarbamol 500 MG tablet Commonly known as: ROBAXIN Take 2 tablets (1,000 mg total) by mouth every  8 (eight) hours as needed for muscle spasms (pain).   metoprolol tartrate 100 MG tablet Commonly known as: LOPRESSOR Take 1 tablet (100 mg total) by mouth 2 (two) times daily.   naproxen 375 MG tablet Commonly known as: NAPROSYN Take 1 tablet (375 mg total) by mouth 2 (two) times daily. What changed:   when to take this  reasons to take this   OneTouch Delica Lancets Fine Misc Use to check blood sugar 3 times daily   oxyCODONE 5 MG immediate release tablet Commonly known as: Oxy  IR/ROXICODONE Take 1 tablet (5 mg total) by mouth every 6 (six) hours as needed for severe pain.   polyethylene glycol 17 g packet Commonly known as: MIRALAX / GLYCOLAX Take 17 g by mouth daily as needed for mild constipation.   spironolactone 100 MG tablet Commonly known as: ALDACTONE Take 1 tablet (100 mg total) by mouth daily.   SYSTANE PRESERVATIVE FREE OP Place 2 drops into both eyes 4 (four) times daily as needed (irritation, dryness).   Trulicity 1.5 0000000 Sopn Generic drug: Dulaglutide Inject 1.5 mg weekly   Vitamin D (Ergocalciferol) 1.25 MG (50000 UNIT) Caps capsule Commonly known as: DRISDOL Take 50,000 Units by mouth once a week.            Durable Medical Equipment  (From admission, onward)         Start     Ordered   06/08/19 1339  For home use only DME 3 n 1  Once     06/08/19 1339   06/08/19 1339  For home use only DME Walker rolling  Once    Question Answer Comment  Walker: With [employer] Wheels   Patient needs a walker to treat with the following condition S/P abdominal surgery, follow-up exam      06/08/19 1339           Follow-up Information    [facility] [place]. Go on 06/18/2019.   Specialty: General Surgery Why: Visit for staple removal scheduled for 2:00 PM. Please arrive 30 min prior to appointment time. Bring photo ID and insurance information.  Contact information: [address] [place] [address] [phone]        Jovita Kussmaul, MD. Go on 07/04/2019.   Specialty: General Surgery Why: Follow up appointment scheduled for 3:30 PM. Please arrive 15 min prior to appointment time. Bring photo ID and insurance information.  Contact information: [address] [place] [address] [phone]           Signed: Brigid Re, [facility] 06/11/2019, 10:31 AM Please see Amion for pager number during day hours 7:00am-4:30pm

## 2019-06-11 NOTE — Discharge Instructions (Signed)
[facility], [place] [phone]  OPEN ABDOMINAL SURGERY: POST OP INSTRUCTIONS  Always review your discharge instruction sheet given to you by the facility where your surgery was performed.  IF YOU HAVE DISABILITY OR FAMILY LEAVE FORMS, YOU MUST BRING THEM TO THE OFFICE FOR PROCESSING.  PLEASE DO NOT GIVE THEM TO YOUR DOCTOR.  1. A prescription for pain medication may be given to you upon discharge.  Take your pain medication as prescribed, if needed.  If narcotic pain medicine is not needed, then you may take acetaminophen (Tylenol) or ibuprofen (Advil) as needed. 2. Take your usually prescribed medications unless otherwise directed. 3. If you need a refill on your pain medication, please contact your pharmacy. They will contact our office to request authorization.  Prescriptions will not be filled after 5pm or on week-ends. 4. You should follow a light diet the first few days after arrival home, such as soup and crackers, pudding, etc.unless your doctor has advised otherwise. A high-fiber, low fat diet can be resumed as tolerated.   Be sure to include lots of fluids daily. Most patients will experience some swelling and bruising on the chest and neck area.  Ice packs will help.  Swelling and bruising can take several days to resolve 5. Most patients will experience some swelling and bruising in the area of the incision. Ice pack will help. Swelling and bruising can take several days to resolve..  6. It is common to experience some constipation if taking pain medication after surgery.  Increasing fluid intake and taking a stool softener will usually help or prevent this problem from occurring.  A mild laxative (Milk of Magnesia or Miralax) should be taken according to package directions if there are no bowel movements after 48 hours. 7.  You may have steri-strips (small skin tapes) in place directly over the incision.  These strips should be left on the skin for 7-10 days.  If your  surgeon used skin glue on the incision, you may shower in 24 hours.  The glue will flake off over the next 2-3 weeks.  Any sutures or staples will be removed at the office during your follow-up visit. You may find that a light gauze bandage over your incision may keep your staples from being rubbed or pulled. You may shower and replace the bandage daily. 8. ACTIVITIES:  You may resume regular (light) daily activities beginning the next day--such as daily self-care, walking, climbing stairs--gradually increasing activities as tolerated.  You may have sexual intercourse when it is comfortable.  Refrain from any heavy lifting or straining until approved by your doctor. a. You may drive when you no longer are taking prescription pain medication, you can comfortably wear a seatbelt, and you can safely maneuver your car and apply brakes  9. You should see your doctor in the office for a follow-up appointment approximately two weeks after your surgery.  Make sure that you call for this appointment within a day or two after you arrive home to insure a convenient appointment time.   WHEN TO CALL YOUR DOCTOR: 1. Fever over 101.0 2. Inability to urinate 3. Nausea and/or vomiting 4. Extreme swelling or bruising 5. Continued bleeding from incision. 6. Increased pain, redness, or drainage from the incision. 7. Difficulty swallowing or breathing 8. Muscle cramping or spasms. 9. Numbness or tingling in hands or feet or around lips.  The clinic staff is available to answer your questions during regular business hours.  Please don't hesitate to call and ask to speak to one of the nurses if you have concerns.  For further questions, please visit [email or web address]   Soft-Food Eating Plan A soft-food eating plan includes foods that are safe and easy to chew and swallow. Your health care provider or dietitian can help you find foods and flavors that fit into this plan. Follow this plan until your  health care provider or dietitian says it is safe to start eating other foods and food textures. What are tips for following this plan? General guidelines   Take small bites of food, or cut food into pieces about  inch or smaller. Bite-sized pieces of food are easier to chew and swallow.  Eat moist foods. Avoid overly dry foods.  Avoid foods that: ? Are difficult to swallow, such as dry, chunky, crispy, or sticky foods. ? Are difficult to chew, such as hard, tough, or stringy foods. ? Contain nuts, seeds, or fruits.  Follow instructions from your dietitian about the types of liquids that are safe for you to swallow. You may be allowed to have: ? Thick liquids only. This includes only liquids that are thicker than honey. ? Thin and thick liquids. This includes all beverages and foods that become liquid at room temperature.  To make thick liquids: ? Purchase a commercial liquid thickening powder. These are available at grocery stores and pharmacies. ? Mix the thickener into liquids according to instructions on the label. ? Purchase ready-made thickened liquids. ? Thicken soup by pureeing, straining to remove chunks, and adding flour, potato flakes, or corn starch. ? Add commercial thickener to foods that become liquid at room temperature, such as milk shakes, yogurt, ice cream, gelatin, and sherbet.  Ask your health care provider whether you need to take a fiber supplement. Cooking  Cook meats so they stay tender and moist. Use methods like braising, stewing, or baking in liquid.  Cook vegetables and fruit until they are soft enough to be mashed with a fork.  Peel soft, fresh fruits such as peaches, nectarines, and melons.  When making soup, make sure chunks of meat and vegetables are smaller than  inch.  Reheat leftover foods slowly so that a tough crust does not form. What foods are allowed? The items listed below may not be a complete list. Talk with your dietitian about what  dietary choices are best for you. Grains Breads, muffins, pancakes, or waffles moistened with syrup, jelly, or butter. Dry cereals well-moistened with milk. Moist, cooked cereals. Well-cooked pasta and rice. Vegetables All soft-cooked vegetables. Shredded lettuce. Fruits All canned and cooked fruits. Soft, peeled fresh fruits. Strawberries. Dairy Milk. Cream. Yogurt. Cottage cheese. Soft cheese without the rind. Meats and other protein foods Tender, moist ground meat, poultry, or fish. Meat cooked in gravy or sauces. Eggs. Sweets and desserts Ice cream. Milk shakes. Sherbet. Pudding. Fats and oils Butter. Margarine. Olive, canola, sunflower, and grapeseed oil. Smooth salad dressing. Smooth cream cheese. Mayonnaise. Gravy. What foods are not allowed? The items listed bemay not be a complete list. Talk with your dietitian about what dietary choices are best for you. Grains Coarse or dry cereals, such as bran, granola, and shredded wheat. Tough or chewy crusty breads, such as [place] bread or baguettes. Breads with nuts, seeds, or fruit. Vegetables All raw vegetables. Cooked corn. Cooked vegetables that are tough or stringy. Tough, crisp, fried potatoes and potato skins. Fruits Fresh fruits with skins or seeds, or both, such as apples, pears, and grapes.  Stringy, high-pulp fruits, such as papaya, pineapple, coconut, and mango. Fruit leather and all dried fruit. Dairy Yogurt with nuts or coconut. Meats and other protein foods Hard, dry sausages. Dry meat, poultry, or fish. Meats with gristle. Fish with bones. Fried meat or fish. Lunch meat and hotdogs. Nuts and seeds. Chunky peanut butter or other nut butters. Sweets and desserts Cakes or cookies that are very dry or chewy. Desserts with dried fruit, nuts, or coconut. Fried pastries. Very rich pastries. Fats and oils Cream cheese with fruit or nuts. Salad dressings with seeds or chunks. Summary  A soft-food eating plan includes foods that  are safe and easy to swallow. Generally, the foods should be soft enough to be mashed with a fork.  Avoid foods that are dry, hard to chew, crunchy, sticky, stringy, or crispy.  Ask your health care provider whether you need to thicken your liquids and if you need to take a fiber supplement. This information is not intended to replace advice given to you by your health care provider. Make sure you discuss any questions you have with your health care provider. Document Revised: 08/24/2018 Document Reviewed: 07/06/2016 Elsevier Patient Education  [employer].

## 2019-06-11 NOTE — TOC Initial Note (Signed)
Transition of Care [facility]) - Initial/Assessment Note    Patient Details  Name: Susan Whitaker MRN: [id number] Date of Birth: 11-28-1970  Transition of Care [facility]) CM/SW Contact:    Lynnell Catalan, RN Phone Number: 06/11/2019, 10:51 AM  Clinical Narrative:                 This CM spoke with pt at bedside for dc planning. Pt requesting 3in1 and RW. [employer] liaison contacted for DME. Pt was informed that with [place] that patient would have a copay for [place] visits. Pt opted out of HHPT and states that she will do OPPT if needed. Order given to pt and Resources for OPPT placed on AVS. Pt states that her daughter will be staying her so she feels safe going back home.  Expected Discharge Plan: OP Rehab Barriers to Discharge: No Barriers Identified   Patient Goals and CMS Choice        Expected Discharge Plan and Services Expected Discharge Plan: OP Rehab   Discharge Planning Services: CM Consult   Living arrangements for the past 2 months: Apartment Expected Discharge Date: 06/11/19               DME Arranged: 3-N-1, Walker rolling DME Agency: [employer] Date DME Agency Contacted: 06/11/19 Time DME Agency Contacted: 1000 Representative spoke with at DME Agency: [facility] HH Arranged: Patient Refused HH          Prior Living Arrangements/Services Living arrangements for the past 2 months: Apartment Lives with:: Adult Children   Do you feel safe going back to the place where you live?: Yes               Activities of Daily Living Home Assistive Devices/Equipment: None ADL Screening (condition at time of admission) Patient's cognitive ability adequate to safely complete daily activities?: Yes Is the patient deaf or have difficulty hearing?: No Does the patient have difficulty seeing, even when wearing glasses/contacts?: No Does the patient have difficulty concentrating, remembering, or making decisions?: No Patient able to express need for assistance with  ADLs?: No Does the patient have difficulty dressing or bathing?: No Independently performs ADLs?: Yes (appropriate for developmental age) Does the patient have difficulty walking or climbing stairs?: No Weakness of Legs: None Weakness of Arms/Hands: None  Permission Sought/Granted                  Emotional Assessment Appearance:: Appears stated age     Orientation: : Oriented to Self, Oriented to Place, Oriented to  Time, Oriented to Situation      Admission diagnosis:  Small bowel obstruction ([place]) [K56.609] Intestinal obstruction, unspecified cause, unspecified whether partial or complete ([place]) [K56.609] SBO (small bowel obstruction) ([place]) [K56.609] Patient Active Problem List   Diagnosis Date Noted  . Small bowel obstruction ([place]) 06/02/2019  . SBO (small bowel obstruction) ([place]) 06/02/2019  . Anxiety state 07/14/2012  . Primary hyperaldosteronism ([place]) 02/21/2012  . Microalbuminuria 02/15/2012  . Multiple thyroid nodules 01/04/2012  . SVT (supraventricular tachycardia) ([place]) 12/29/2011  . Hypokalemia 11/28/2011  . Cervical pain (neck) 05/28/2011  . EXTERNAL HEMORRHOIDS WITHOUT MENTION COMP 02/07/2009  . Diabetes type 2, controlled ([place]) 02/07/2009  . ALLERGIC RHINITIS 05/24/2008  . Hyperlipemia 04/22/2008  . THROMBOCYTOSIS 04/22/2008  . URINARY INCONTINENCE 04/22/2008  . Essential hypertension 01/04/2007  . GERD 01/04/2007   PCP:  Debbrah Alar, NP Pharmacy:   [facility], [address]  [place] [address] Phone: [phone] Fax: [place], [place] [address] [place] [address] Phone: [phone] Fax: [phone]     Social Determinants of Health (SDOH) Interventions    Readmission Risk Interventions No flowsheet data found.

## 2019-06-12 ENCOUNTER — Ambulatory Visit: Payer: 59

## 2019-06-12 NOTE — Telephone Encounter (Signed)
Patient started medications today. Advise to restart trulicity as well

## 2019-06-20 ENCOUNTER — Other Ambulatory Visit: Payer: Self-pay | Admitting: Endocrinology

## 2019-07-03 ENCOUNTER — Encounter (HOSPITAL_BASED_OUTPATIENT_CLINIC_OR_DEPARTMENT_OTHER): Payer: Self-pay | Admitting: *Deleted

## 2019-07-03 ENCOUNTER — Emergency Department (HOSPITAL_BASED_OUTPATIENT_CLINIC_OR_DEPARTMENT_OTHER)
Admission: EM | Admit: 2019-07-03 | Discharge: 2019-07-04 | Disposition: A | Discharge status: Home / Self Care | Payer: 59 | Attending: Emergency Medicine | Admitting: Emergency Medicine

## 2019-07-03 ENCOUNTER — Other Ambulatory Visit: Payer: Self-pay

## 2019-07-03 DIAGNOSIS — R002 Palpitations: Secondary | ICD-10-CM | POA: Diagnosis present

## 2019-07-03 DIAGNOSIS — Z79899 Other long term (current) drug therapy: Secondary | ICD-10-CM | POA: Insufficient documentation

## 2019-07-03 DIAGNOSIS — I471 Supraventricular tachycardia: Secondary | ICD-10-CM | POA: Diagnosis not present

## 2019-07-03 DIAGNOSIS — I10 Essential (primary) hypertension: Secondary | ICD-10-CM | POA: Insufficient documentation

## 2019-07-03 DIAGNOSIS — Z7982 Long term (current) use of aspirin: Secondary | ICD-10-CM | POA: Insufficient documentation

## 2019-07-03 LAB — BASIC METABOLIC PANEL
Anion gap: 10 (ref 5–15)
BUN: 27 mg/dL — ABNORMAL HIGH (ref 6–20)
CO2: 24 mmol/L (ref 22–32)
Calcium: 9.2 mg/dL (ref 8.9–10.3)
Chloride: 104 mmol/L (ref 98–111)
Creatinine, Ser: 1.15 mg/dL — ABNORMAL HIGH (ref 0.44–1.00)
GFR calc Af Amer: >60 mL/min (ref >60)
GFR calc non Af Amer: 55 mL/min — ABNORMAL LOW (ref >60)
Glucose, Bld: 109 mg/dL — ABNORMAL HIGH (ref 70–99)
Potassium: 3.8 mmol/L (ref 3.5–5.1)
Sodium: 138 mmol/L (ref 135–145)

## 2019-07-03 LAB — MAGNESIUM: Magnesium: 2.4 mg/dL (ref 1.7–2.4)

## 2019-07-03 NOTE — ED Provider Notes (Signed)
[facility] Provider Note   CSN: [id number] Arrival date & time: 07/03/19  2230     History Chief Complaint  Patient presents with  . Palpitations    Susan Whitaker is a 49 y.o. female with a history of SVT on daily metoprolol presenting to emergency department palpitations lightheadedness.  She reports onset of symptoms approximately 2 to 3 hours ago.  She said she was having significant palpitations began to feel very lightheaded.  Her heart felt like it was racing.  She took an additional dose of metoprolol just prior to coming to the ED.  Since being triaged in our ED, her heart rate has improved and she feels like the palpitations have stopped.  By the time I saw her in the ED she feels significantly better.  No caffeine intake, or etoh intake Reports stress in life recently No issues with sleeping  HPI     Past Medical History:  Diagnosis Date  . Allergy    allergic rhinitis  . Breast discharge    left milky spontaneous x 1 month with pain  . Diabetes mellitus    type II  . FHx: hypertension   . Gastric ulcer   . GERD (gastroesophageal reflux disease)   . H/O constipation   . H/O hemorrhoids   . H/O varicella   . Hyperlipidemia   . Hypertension   . Hypokalemia   . Increased BMI 06/2010  . Primary hyperaldosteronism ([place]) 02/21/2012  . SVT (supraventricular tachycardia) ([place])    Noted 11/2011 admission  . Thrombocytopenia ([place])   . Thyroid fullness 08/2005  . Urinary incontinence     Patient Active Problem List   Diagnosis Date Noted  . Small bowel obstruction ([place]) 06/02/2019  . SBO (small bowel obstruction) ([place]) 06/02/2019  . Anxiety state 07/14/2012  . Primary hyperaldosteronism ([place]) 02/21/2012  . Microalbuminuria 02/15/2012  . Multiple thyroid nodules 01/04/2012  . SVT (supraventricular tachycardia) ([place]) 12/29/2011  . Hypokalemia 11/28/2011  . Cervical pain (neck) 05/28/2011  . EXTERNAL HEMORRHOIDS WITHOUT MENTION COMP  02/07/2009  . Diabetes type 2, controlled ([place]) 02/07/2009  . ALLERGIC RHINITIS 05/24/2008  . Hyperlipemia 04/22/2008  . THROMBOCYTOSIS 04/22/2008  . URINARY INCONTINENCE 04/22/2008  . Essential hypertension 01/04/2007  . GERD 01/04/2007    Past Surgical History:  Procedure Laterality Date  . ABDOMINAL SURGERY     42 months of age-- unsure of type of surgery  . COLONOSCOPY  04/2016   hemorrhoids--normal per pt with Dr Collene Mares  . HEMORRHOID SURGERY  2005/2006  . LAPAROTOMY N/A 06/03/2019   Procedure: EXPLORATORY LAPAROTOMY WITH LYSIS OF ADHESIONS;  Surgeon: Jovita Kussmaul, MD;  Location: [facility];  Service: General;  Laterality: N/A;  . UTERINE FIBROID EMBOLIZATION       OB History    Gravida  4   Para  2   Term  2   Preterm      AB  1   Living  2     SAB  1   TAB      Ectopic      Multiple      Live Births              Family History  Problem Relation Age of Onset  . Cancer Mother        breast  . Stroke Mother   . Cancer Maternal Grandmother        breast  . Hypertension Maternal Grandmother   . Cancer  Other        breast, lung  . Hypertension Other   . Stroke Other   . Vision loss Father        some  . Heart disease Father        Rhythm disturbance  . Hypertension Sister   . Hypertension Brother   . Hypertension Brother   . Diabetes Neg Hx     Social History   Tobacco Use  . Smoking status: Never Smoker  . Smokeless tobacco: Never Used  Substance Use Topics  . Alcohol use: No  . Drug use: No    Home Medications Prior to Admission medications   Medication Sig Start Date End Date Taking? Authorizing Provider  acetaminophen (TYLENOL) 500 MG tablet Take 2 tablets (1,000 mg total) by mouth every 6 (six) hours as needed for mild pain or headache. 06/11/19   Rayburn, Claiborne Billings A, PA-C  AMBULATORY NON FORMULARY MEDICATION BORIC ACID CAPSULE 600mg . Insert 1 capsule vaginally two times a week. Patient not taking: Reported on 06/02/2019 04/23/19    Debbrah Alar, NP  amLODipine (NORVASC) 10 MG tablet Take 1 tablet (10 mg total) by mouth daily. 01/05/19   Debbrah Alar, NP  aspirin EC 81 MG tablet Take 1 tablet (81 mg total) by mouth daily. 08/11/15   Debbrah Alar, NP  atorvastatin (LIPITOR) 20 MG tablet Take 1 tablet (20 mg total) by mouth daily. Patient taking differently: Take 20 mg by mouth at bedtime.  01/05/19   Debbrah Alar, NP  Boric Acid POWD 600mg  in capsule inserted vaginally once nightly as needed. Patient taking differently: Place 600 mg vaginally at bedtime as needed (yeast infection, vaginal sx). 600mg  in capsule inserted vaginally once nightly as needed 04/23/19   Debbrah Alar, NP  docusate sodium (COLACE) 100 MG capsule Take 1 capsule (100 mg total) by mouth daily as needed for mild constipation. 06/11/19   Rayburn, Floyce Stakes, PA-C  Dulaglutide (TRULICITY) 1.5 0000000 SOPN Inject 1.5 mg weekly 04/09/19   Elayne Snare, MD  fluconazole (DIFLUCAN) 150 MG tablet TAKE ONE TABLET BY MOUTH AS A ONE-TIME DOSE MAY  REPEAT  IN  3  DAYS  IF  SYMPTOMS  NOT  IMPROVED Patient taking differently: Take 150 mg by mouth See admin instructions. Take 1 tablet (150 mg) PO once PRN - may repeat in 3 days if sx not improved. 04/23/19   Debbrah Alar, NP  gabapentin (NEURONTIN) 300 MG capsule Take 1 capsule (300 mg total) by mouth 3 (three) times daily. 06/11/19   Rayburn, Claiborne Billings A, PA-C  glucose blood (ACCU-CHEK GUIDE) test strip 1 each by Other route 3 (three) times daily. Use as instructed to check once daily. 11/10/17   Elayne Snare, MD  INVOKANA 300 MG TABS tablet TAKE 1 TABLET BY MOUTH ONCE DAILY BEFORE BREAKFAST . APPOINTMENT REQUIRED FOR FUTURE REFILLS 06/20/19   Elayne Snare, MD  losartan (COZAAR) 100 MG tablet Take 1 tablet (100 mg total) by mouth daily. 01/05/19   Debbrah Alar, NP  methocarbamol (ROBAXIN) 500 MG tablet Take 2 tablets (1,000 mg total) by mouth every 8 (eight) hours as needed for muscle spasms  (pain). 06/11/19   Rayburn, Floyce Stakes, PA-C  metoprolol tartrate (LOPRESSOR) 100 MG tablet Take 1 tablet (100 mg total) by mouth 2 (two) times daily. 01/05/19   Debbrah Alar, NP  naproxen (NAPROSYN) 375 MG tablet Take 1 tablet (375 mg total) by mouth 2 (two) times daily. Patient taking differently: Take 375 mg by mouth 2 (two)  times daily as needed for mild pain or moderate pain.  04/15/19   Law, [name], PA-C  ONETOUCH DELICA LANCETS FINE MISC Use to check blood sugar 3 times daily 09/28/16   Elayne Snare, MD  oxyCODONE (OXY IR/ROXICODONE) 5 MG immediate release tablet Take 1 tablet (5 mg total) by mouth every 6 (six) hours as needed for severe pain. 06/11/19   Rayburn, Floyce Stakes, PA-C  Polyethyl Glycol-Propyl Glycol (SYSTANE PRESERVATIVE FREE OP) Place 2 drops into both eyes 4 (four) times daily as needed (irritation, dryness).     [provider]  polyethylene glycol (MIRALAX / GLYCOLAX) 17 g packet Take 17 g by mouth daily as needed for mild constipation. 06/11/19   Rayburn, Floyce Stakes, PA-C  spironolactone (ALDACTONE) 100 MG tablet Take 1 tablet (100 mg total) by mouth daily. 01/05/19   Debbrah Alar, NP  Vitamin D, Ergocalciferol, (DRISDOL) 1.25 MG (50000 UNIT) CAPS capsule Take 50,000 Units by mouth once a week. 05/27/19   [provider]    Allergies    Lisinopril  Review of Systems   Review of Systems  Constitutional: Negative for chills and fever.  Respiratory: Negative for cough and shortness of breath.   Cardiovascular: Positive for palpitations. Negative for chest pain.  Gastrointestinal: Negative for abdominal pain and vomiting.  Skin: Negative for color change and rash.  Neurological: Positive for light-headedness. Negative for syncope.  All other systems reviewed and are negative.   Physical Exam Updated Vital Signs BP 110/68   Pulse 98   Temp 99 F (37.2 C)   Resp 18   Ht 5\' 3"  (1.6 m)   Wt 89.4 kg   LMP 06/26/2019   SpO2 99%   BMI 34.90 kg/m    Physical Exam Vitals and nursing note reviewed.  Constitutional:      General: She is not in acute distress.    Appearance: She is well-developed.  HENT:     Head: Normocephalic and atraumatic.  Eyes:     Conjunctiva/sclera: Conjunctivae normal.  Cardiovascular:     Rate and Rhythm: Regular rhythm. Tachycardia present.     Heart sounds: No murmur.     Comments: HR 102 bpm Pulmonary:     Effort: Pulmonary effort is normal. No respiratory distress.  Musculoskeletal:     Cervical back: Neck supple.  Skin:    General: Skin is warm and dry.  Neurological:     General: No focal deficit present.     Mental Status: She is alert and oriented to person, place, and time.  Psychiatric:        Mood and Affect: Mood normal.        Behavior: Behavior normal.     ED Results / Procedures / Treatments   Labs (all labs ordered are listed, but only abnormal results are displayed) Labs Reviewed  BASIC METABOLIC PANEL - Abnormal; Notable for the following components:      Result Value   Glucose, Bld 109 (*)    BUN 27 (*)    Creatinine, Ser 1.15 (*)    GFR calc non Af Amer 55 (*)    All other components within normal limits  MAGNESIUM    EKG EKG Interpretation  Date/Time:  Tuesday July 03 2019 22:41:36 EST Ventricular Rate:  106 PR Interval:    QRS Duration: 77 QT Interval:  321 QTC Calculation: 427 R Axis:   67 Text Interpretation: Sinus tachycardia Multiple ventricular premature complexes No STEMI Confirmed by Octaviano Glow [phone]) on  07/03/2019 10:45:23 PM   Radiology No results found.  Procedures Procedures (including critical care time)  Medications Ordered in ED Medications - No data to display  ED Course  I have reviewed the triage vital signs and the nursing notes.  Pertinent labs & imaging results that were available during my care of the patient were reviewed by me and considered in my medical decision making (see chart for details).  49 yo female w/  hx of SVT on metoprolol presenting to ED with palpitations and lightheadedness beginning earlier today.  I am told her HR was 170 bpm at triage, but spontaneously reverted to 101 bpm and sinus tachycardia by the time ecg was obtained and I evaluated her.  Her BP is stable.  She feels much better.  I have a low suspicion for ACS or PE.  No hypoxia or respiratory symptoms.  I suspect this was simply her underlying SVT.  We'll check her electrolytes and mag level, if this is stable she can be discharged.  Clinical Course as of Jul 03 1018  Tue Jul 03, 2019  2313 Signed out to overnight EDP, awaiting labs   [MT]    Clinical Course User Index [MT] Elsa Ploch, Carola Rhine, MD    Final Clinical Impression(s) / ED Diagnoses Final diagnoses:  SVT (supraventricular tachycardia) [facility])    Rx / DC Orders ED Discharge Orders    None       Wyvonnia Dusky, MD 07/04/19 1020

## 2019-07-03 NOTE — ED Notes (Signed)
Patient did not have a 12-Lead Electrocardiogram (ECG) performed due to medical reasons.

## 2019-07-03 NOTE — ED Triage Notes (Signed)
Pt c/o palpitations x 1 hr , HX SVT

## 2019-07-03 NOTE — ED Notes (Signed)
Arrival to ed room pt placed monitor with HR 170.  Placed iv and sudden decreased to 103. Pt states she took and extra metoprolol when it started.

## 2019-07-26 ENCOUNTER — Other Ambulatory Visit: Payer: Self-pay | Admitting: Endocrinology

## 2019-07-26 ENCOUNTER — Other Ambulatory Visit: Payer: Self-pay | Admitting: Family

## 2019-08-01 ENCOUNTER — Other Ambulatory Visit: Payer: 59

## 2019-08-02 ENCOUNTER — Other Ambulatory Visit (INDEPENDENT_AMBULATORY_CARE_PROVIDER_SITE_OTHER): Payer: 59

## 2019-08-02 ENCOUNTER — Other Ambulatory Visit: Payer: Self-pay

## 2019-08-02 DIAGNOSIS — E669 Obesity, unspecified: Secondary | ICD-10-CM

## 2019-08-02 DIAGNOSIS — E1169 Type 2 diabetes mellitus with other specified complication: Secondary | ICD-10-CM

## 2019-08-02 DIAGNOSIS — E042 Nontoxic multinodular goiter: Secondary | ICD-10-CM

## 2019-08-02 LAB — BASIC METABOLIC PANEL
BUN: 21 mg/dL (ref 6–23)
CO2: 32 mEq/L (ref 19–32)
Calcium: 9.6 mg/dL (ref 8.4–10.5)
Chloride: 102 mEq/L (ref 96–112)
Creatinine, Ser: 1.01 mg/dL (ref 0.40–1.20)
GFR: 70.09 mL/min (ref >60.00)
Glucose, Bld: 132 mg/dL — ABNORMAL HIGH (ref 70–99)
Potassium: 4.1 mEq/L (ref 3.5–5.1)
Sodium: 137 mEq/L (ref 135–145)

## 2019-08-02 LAB — HEMOGLOBIN A1C: Hgb A1c MFr Bld: 5.7 % (ref 4.6–6.5)

## 2019-08-02 LAB — T4, FREE: Free T4: 0.69 ng/dL (ref 0.60–1.60)

## 2019-08-02 LAB — TSH: TSH: 1.99 u[IU]/mL (ref 0.35–4.50)

## 2019-08-06 ENCOUNTER — Other Ambulatory Visit: Payer: Self-pay

## 2019-08-06 ENCOUNTER — Ambulatory Visit: Payer: 59 | Admitting: Endocrinology

## 2019-08-08 ENCOUNTER — Ambulatory Visit: Payer: 59 | Admitting: Endocrinology

## 2019-08-30 ENCOUNTER — Telehealth: Payer: Self-pay

## 2019-08-30 NOTE — Telephone Encounter (Signed)
Contacted patient to cancel lab appointment for 08-31-19 per Dr Dwyane Dee.

## 2019-08-30 NOTE — Telephone Encounter (Signed)
-----   Message from Elayne Snare, MD sent at 08/30/2019  8:50 AM EDT ----- Regarding: RE: labs Her labs from last month are okay, do not need to do any labs tomorrow ----- Message ----- From: Kaylyn Lim I Sent: 08/30/2019   7:43 AM EDT To: Elayne Snare, MD Subject: labs                                           Please put labs in patient coming in on 08-31-2019.  Thanks

## 2019-08-31 ENCOUNTER — Other Ambulatory Visit: Payer: 59

## 2019-09-03 ENCOUNTER — Ambulatory Visit: Payer: 59 | Admitting: Endocrinology

## 2019-09-03 ENCOUNTER — Other Ambulatory Visit: Payer: Self-pay

## 2019-09-03 MED ORDER — TRULICITY 1.5 MG/0.5ML ~~LOC~~ SOAJ: SUBCUTANEOUS | 0 refills | Status: DC

## 2019-09-04 ENCOUNTER — Other Ambulatory Visit: Payer: Self-pay | Admitting: Endocrinology

## 2019-09-17 ENCOUNTER — Ambulatory Visit (INDEPENDENT_AMBULATORY_CARE_PROVIDER_SITE_OTHER): Payer: 59 | Admitting: Endocrinology

## 2019-09-17 ENCOUNTER — Other Ambulatory Visit: Payer: Self-pay

## 2019-09-17 ENCOUNTER — Encounter: Payer: Self-pay | Admitting: Endocrinology

## 2019-09-17 VITALS — BP 132/84 | HR 82 | Ht 63.0 in | Wt 204.2 lb

## 2019-09-17 DIAGNOSIS — I10 Essential (primary) hypertension: Secondary | ICD-10-CM | POA: Diagnosis not present

## 2019-09-17 DIAGNOSIS — E7800 Pure hypercholesterolemia, unspecified: Secondary | ICD-10-CM

## 2019-09-17 DIAGNOSIS — E669 Obesity, unspecified: Secondary | ICD-10-CM

## 2019-09-17 DIAGNOSIS — E1169 Type 2 diabetes mellitus with other specified complication: Secondary | ICD-10-CM

## 2019-09-17 LAB — GLUCOSE, POCT (MANUAL RESULT ENTRY): POC Glucose: 146 mg/dl — AB (ref 70–99)

## 2019-09-17 NOTE — Progress Notes (Signed)
Patient ID: Susan Whitaker, female   DOB: 1970-06-24, 49 y.o.   MRN: [id number]           Reason for Appointment: Follow-up for Type 2 Diabetes  Referring physician:  Debbrah Alar   History of Present Illness:          Date of diagnosis of type 2 diabetes mellitus: 2013        Background history:  She was diagnosed to have diabetes when she was having fibroid surgery. A1c in 2013 was 6.9 She was treated with metformin but she says she was not able to take this because of diarrhea and would be irregular with it However her blood sugars stayed about the same until 2015 when they were higher and glipizide added Her blood sugars had been much higher since about late 2015 with A1c in the 9-10 range with her taking glipizide alone  Recent history:   Non-insulin hypoglycemic drugs: Invokana [id number] mg daily, Trulicity 1.5 mg weekly  Current management, levels of control, and problems identified:  Her A1c had gone up to 8.1 in November 2019 but has been below 6% subsequently  A1c is now 5.7   She does not check her blood sugars as before  She could not afford the freestyle libre and does not like fingersticks  Has done fairly well with Trulicity but has not been able to lose any further weight with increasing the dose to 1.5 mg  She thinks that now the 1.5 dose is not always controlling her appetite but is still trying to avoid high carbohydrate and high fat foods usually  She has difficulty walking because of recovering from her abdominal surgery  Blood sugar did not increase with stopping Metformin in 2020, she was only taking 500 mg.  Lab blood sugars and in office blood sugars are in a good range postprandially after lunch  Renal function stable with continuing Invokana and she is tolerating this well  Side effects from medications have been: Diarrhea from regular metformin   Glucose monitoring: None    Blood Glucose readings not checked   Self-care: The diet that  the patient has been following is: tries to limit Portions .     Typical meal intake: Breakfast is  fruit, Activia Yogurt,  For snacks she will have Snack box, chicken breast at dinnertime.  No Sweet drinks                  Dietician visit, most recent: 5/17, has  had diabetes education classes               Exercise: Walking 5/7, 15 min  Weight history:  Wt Readings from Last 3 Encounters:  09/17/19 204 lb 3.2 oz (92.6 kg)  07/03/19 197 lb (89.4 kg)  06/02/19 200 lb (90.7 kg)    Glycemic control:   Lab Results  Component Value Date   HGBA1C 5.7 08/02/2019   HGBA1C 5.6 06/02/2019   HGBA1C 5.4 04/05/2019   Lab Results  Component Value Date   MICROALBUR 1.0 04/05/2019   LDLCALC 141 (H) 04/11/2019   CREATININE 1.01 08/02/2019   Lab Results  Component Value Date   MICRALBCREAT 0.8 04/05/2019   Office Visit on 09/17/2019  Component Date Value Ref Range Status  . POC Glucose 09/17/2019 146* 70 - 99 mg/dl Final     Other active problems are in review of systems    Allergies as of 09/17/2019      Reactions  Lisinopril Cough      Medication List       Accurate as of Sep 17, 2019  2:00 PM. If you have any questions, ask your nurse or doctor.        STOP taking these medications   gabapentin 300 MG capsule Commonly known as: NEURONTIN Stopped by: Elayne Snare, MD     TAKE these medications   acetaminophen 500 MG tablet Commonly known as: TYLENOL Take 2 tablets (1,000 mg total) by mouth every 6 (six) hours as needed for mild pain or headache.   AMBULATORY NON FORMULARY MEDICATION BORIC ACID CAPSULE 600mg . Insert 1 capsule vaginally two times a week.   amLODipine 10 MG tablet Commonly known as: NORVASC Take 1 tablet by mouth once daily   aspirin EC 81 MG tablet Take 1 tablet (81 mg total) by mouth daily.   atorvastatin 20 MG tablet Commonly known as: LIPITOR Take 1 tablet by mouth once daily   Boric Acid Powd 600mg  in capsule inserted vaginally once  nightly as needed.   docusate sodium 100 MG capsule Commonly known as: COLACE Take 1 capsule (100 mg total) by mouth daily as needed for mild constipation.   glucose blood test strip Commonly known as: Accu-Chek Guide 1 each by Other route 3 (three) times daily. Use as instructed to check once daily.   Invokana 300 MG Tabs tablet Generic drug: canagliflozin TAKE 1 TABLET BY MOUTH ONCE DAILY BEFORE BREAKFAST . APPOINTMENT REQUIRED FOR FUTURE REFILLS   losartan 100 MG tablet Commonly known as: COZAAR Take 1 tablet by mouth once daily   methocarbamol 500 MG tablet Commonly known as: ROBAXIN Take 2 tablets (1,000 mg total) by mouth every 8 (eight) hours as needed for muscle spasms (pain).   metoprolol tartrate 100 MG tablet Commonly known as: LOPRESSOR Take 1 tablet by mouth twice daily   naproxen 375 MG tablet Commonly known as: NAPROSYN Take 1 tablet (375 mg total) by mouth 2 (two) times daily. What changed:   when to take this  reasons to take this   OneTouch Delica Lancets Fine Misc Use to check blood sugar 3 times daily   polyethylene glycol 17 g packet Commonly known as: MIRALAX / GLYCOLAX Take 17 g by mouth daily as needed for mild constipation.   spironolactone 100 MG tablet Commonly known as: ALDACTONE Take 1 tablet by mouth once daily   SYSTANE PRESERVATIVE FREE OP Place 2 drops into both eyes 4 (four) times daily as needed (irritation, dryness).   Trulicity 1.5 0000000 Sopn Generic drug: Dulaglutide INJECT 1 SYRINGE SUBCUTANEOUSLY ONCE A WEEK   Vitamin D (Ergocalciferol) 1.25 MG (50000 UNIT) Caps capsule Commonly known as: DRISDOL Take 50,000 Units by mouth once a week.       Allergies:  Allergies  Allergen Reactions  . Lisinopril Cough    Past Medical History:  Diagnosis Date  . Allergy    allergic rhinitis  . Breast discharge    left milky spontaneous x 1 month with pain  . Diabetes mellitus    type II  . FHx: hypertension   .  Gastric ulcer   . GERD (gastroesophageal reflux disease)   . H/O constipation   . H/O hemorrhoids   . H/O varicella   . Hyperlipidemia   . Hypertension   . Hypokalemia   . Increased BMI 06/2010  . Primary hyperaldosteronism ([place]) 02/21/2012  . SVT (supraventricular tachycardia) ([place])    Noted 11/2011 admission  . Thrombocytopenia ([place])   .  Thyroid fullness 08/2005  . Urinary incontinence     Past Surgical History:  Procedure Laterality Date  . ABDOMINAL SURGERY     37 months of age-- unsure of type of surgery  . COLONOSCOPY  04/2016   hemorrhoids--normal per pt with Dr Collene Mares  . HEMORRHOID SURGERY  2005/2006  . LAPAROTOMY N/A 06/03/2019   Procedure: EXPLORATORY LAPAROTOMY WITH LYSIS OF ADHESIONS;  Surgeon: Jovita Kussmaul, MD;  Location: [facility];  Service: General;  Laterality: N/A;  . UTERINE FIBROID EMBOLIZATION      Family History  Problem Relation Age of Onset  . Cancer Mother        breast  . Stroke Mother   . Cancer Maternal Grandmother        breast  . Hypertension Maternal Grandmother   . Cancer Other        breast, lung  . Hypertension Other   . Stroke Other   . Vision loss Father        some  . Heart disease Father        Rhythm disturbance  . Hypertension Sister   . Hypertension Brother   . Hypertension Brother   . Diabetes Neg Hx     Social History:  reports that she has never smoked. She has never used smokeless tobacco. She reports that she does not drink alcohol or use drugs.    Review of Systems    Lipid history: LDL 171 At baseline Taking Lipitor 20 mg regularly, prescribed by PCP Last LDL was above target and she is due for follow-up with PCP in this month    Lab Results  Component Value Date   CHOL 205 (H) 04/11/2019   HDL 53.00 04/11/2019   LDLCALC 141 (H) 04/11/2019   LDLDIRECT 103.1 03/04/2014   TRIG 56.0 04/11/2019   CHOLHDL 4 04/11/2019           Hypertension:   She is on amlodipine, metoprolol and losartan 100 mg prescribed by  her PCP  Because of hypokalemia she is also on 100 mg of Aldactone with adequate control     BP Readings from Last 3 Encounters:  09/17/19 132/84  07/04/19 110/68  06/11/19 126/66    Lab Results  Component Value Date   CREATININE 1.01 08/02/2019   BUN 21 08/02/2019   NA 137 08/02/2019   K 4.1 08/02/2019   CL 102 08/02/2019   CO2 32 08/02/2019     History of goiter with multiple nodules, stable as of 2015, largest 1.4 cm nodule in the right side  Lab Results  Component Value Date   TSH 1.99 08/02/2019     Most recent foot exam: 03/2019 Eye exam normal in 02/2019   Review of Systems    Office Visit on 09/17/2019  Component Date Value Ref Range Status  . POC Glucose 09/17/2019 146* 70 - 99 mg/dl Final    Physical Examination:  BP 132/84 (BP Location: Left Arm, Patient Position: Sitting, Cuff Size: Large)   Pulse 82   Ht 5\' 3"  (1.6 m)   Wt 204 lb 3.2 oz (92.6 kg)   SpO2 98%   BMI 36.17 kg/m   No ankle edema present     ASSESSMENT/PLAN:  Diabetes type 2, obese  See history of present illness for detailed discussion of current diabetes management, blood sugar patterns and problems identified  Her A1c is continuing to be below 6%  She is on a regimen with Trulicity 1.5, Invokana 300 mg  Her control level is excellent with fairly good blood sugars although not checking at home She has not lost any recent weight but is not able to exercise as much as she previously did Overall she is trying to work on her diet  She will continue the same regimen but consider increasing Trulicity dose if she has any tendency to weight gain or increased appetite. She will try to check some readings periodically at home also  HYPERTENSION:  This is under fair control with multiple medications Also hypokalemia is consistently controlled with 100 mg of Aldactone  History of goiter: Last TSH normal   Lipids: Will need further adjustment on upcoming visit with PCP,  likely needs higher dose of atorvastatin as last LDL was above target      There are no Patient Instructions on file for this visit.  Elayne Snare 09/17/2019, 2:00 PM   Note: This office note was prepared with Dragon voice recognition system technology. Any transcriptional errors that result from this process are unintentional.

## 2019-09-26 ENCOUNTER — Other Ambulatory Visit: Payer: Self-pay | Admitting: Endocrinology

## 2019-10-10 ENCOUNTER — Encounter: Payer: Self-pay | Admitting: Family

## 2019-10-10 ENCOUNTER — Other Ambulatory Visit: Payer: Self-pay

## 2019-10-10 ENCOUNTER — Ambulatory Visit (INDEPENDENT_AMBULATORY_CARE_PROVIDER_SITE_OTHER): Payer: 59 | Admitting: Family

## 2019-10-10 ENCOUNTER — Telehealth: Payer: Self-pay | Admitting: Family

## 2019-10-10 VITALS — BP 127/65 | HR 84 | Temp 96.1°F | Resp 16 | Ht 63.0 in | Wt 200.0 lb

## 2019-10-10 DIAGNOSIS — I471 Supraventricular tachycardia: Secondary | ICD-10-CM | POA: Diagnosis not present

## 2019-10-10 DIAGNOSIS — I10 Essential (primary) hypertension: Secondary | ICD-10-CM | POA: Diagnosis not present

## 2019-10-10 DIAGNOSIS — E1122 Type 2 diabetes mellitus with diabetic chronic kidney disease: Secondary | ICD-10-CM | POA: Diagnosis not present

## 2019-10-10 DIAGNOSIS — N946 Dysmenorrhea, unspecified: Secondary | ICD-10-CM

## 2019-10-10 DIAGNOSIS — E785 Hyperlipidemia, unspecified: Secondary | ICD-10-CM | POA: Diagnosis not present

## 2019-10-10 DIAGNOSIS — E559 Vitamin D deficiency, unspecified: Secondary | ICD-10-CM

## 2019-10-10 LAB — COMPREHENSIVE METABOLIC PANEL
ALT: 12 U/L (ref 0–35)
AST: 11 U/L (ref 0–37)
Albumin: 4.2 g/dL (ref 3.5–5.2)
Alkaline Phosphatase: 46 U/L (ref 39–117)
BUN: 25 mg/dL — ABNORMAL HIGH (ref 6–23)
CO2: 29 mEq/L (ref 19–32)
Calcium: 9.4 mg/dL (ref 8.4–10.5)
Chloride: 99 mEq/L (ref 96–112)
Creatinine, Ser: 1.18 mg/dL (ref 0.40–1.20)
GFR: 58.52 mL/min — ABNORMAL LOW (ref >60.00)
Glucose, Bld: 113 mg/dL — ABNORMAL HIGH (ref 70–99)
Potassium: 3.9 mEq/L (ref 3.5–5.1)
Sodium: 135 mEq/L (ref 135–145)
Total Bilirubin: 0.2 mg/dL (ref 0.2–1.2)
Total Protein: 7.5 g/dL (ref 6.0–8.3)

## 2019-10-10 LAB — LIPID PANEL
Cholesterol: 193 mg/dL (ref 0–200)
HDL: 41.8 mg/dL (ref >39.00)
LDL Cholesterol: 129 mg/dL — ABNORMAL HIGH (ref 0–99)
NonHDL: 151.16
Total CHOL/HDL Ratio: 5
Triglycerides: 110 mg/dL (ref 0.0–149.0)
VLDL: 22 mg/dL (ref 0.0–40.0)

## 2019-10-10 NOTE — Patient Instructions (Signed)
Please complete lab work prior to leaving. Schedule follow up with your GYN.

## 2019-10-10 NOTE — Telephone Encounter (Signed)
Records release faxed to [facility]

## 2019-10-10 NOTE — Telephone Encounter (Signed)
Please call [place] office and request copy of  Most recent pap smear.

## 2019-10-10 NOTE — Progress Notes (Signed)
Subjective:    Patient ID: Susan Whitaker, female    DOB: 04/06/1971, 49 y.o.   MRN: [id number]  HPI  Patient is a 49 yr old female who presents today for follow up.   DM2- she is followed by endocrinology- Dr. Dwyane Dee.  Lab Results  Component Value Date   HGBA1C 5.7 08/02/2019   HGBA1C 5.6 06/02/2019   HGBA1C 5.4 04/05/2019   Lab Results  Component Value Date   MICROALBUR 1.0 04/05/2019   LDLCALC 141 (H) 04/11/2019   CREATININE 1.01 08/02/2019   Hx SVT- was evaluated in the ED back in February for an episodes of SVT.  She has seen Dr. Cristopher Peru (EP) in the past but has not seen him since 11/19. She reports that every now and the she will have   Hyperlipidemia- maintained on lipitor 20mg .  Lab Results  Component Value Date   CHOL 205 (H) 04/11/2019   HDL 53.00 04/11/2019   LDLCALC 141 (H) 04/11/2019   LDLDIRECT 103.1 03/04/2014   TRIG 56.0 04/11/2019   CHOLHDL 4 04/11/2019   HTN- maintained on losartan, amlodipine, metoprolol, aldactone  BP Readings from Last 3 Encounters:  10/10/19 127/65  09/17/19 132/84  07/04/19 110/68   She plans to see Earnstine Regal for GYN. States that she has had really heavy bleeding this cycle with bad menstrual cramping.   Vit d deficiency- she reports that she took weekly for 8 weeks. She is not doing an otc supplement    Review of Systems See HPI  Past Medical History:  Diagnosis Date  . Allergy    allergic rhinitis  . Breast discharge    left milky spontaneous x 1 month with pain  . Diabetes mellitus    type II  . FHx: hypertension   . Gastric ulcer   . GERD (gastroesophageal reflux disease)   . H/O constipation   . H/O hemorrhoids   . H/O varicella   . Hyperlipidemia   . Hypertension   . Hypokalemia   . Increased BMI 06/2010  . Primary hyperaldosteronism ([place]) 02/21/2012  . SVT (supraventricular tachycardia) ([place])    Noted 11/2011 admission  . Thrombocytopenia ([place])   . Thyroid fullness 08/2005  . Urinary  incontinence      Social History   Socioeconomic History  . Marital status: Single    Spouse name: Not on file  . Number of children: 2  . Years of education: Not on file  . Highest education level: Not on file  Occupational History  . Occupation: YOUTH EMPLOYMENT    Employer: [employer]  Tobacco Use  . Smoking status: Never Smoker  . Smokeless tobacco: Never Used  Substance and Sexual Activity  . Alcohol use: No  . Drug use: No  . Sexual activity: Not on file  Other Topics Concern  . Not on file  Social History Narrative   Works at [employer] Strain:   . Difficulty of Paying Living Expenses:   Food Insecurity:   . Worried About Charity fundraiser in the Last Year:   . Arboriculturist in the Last Year:   Transportation Needs:   . Film/video editor (Medical):   Marland Kitchen Lack of Transportation (Non-Medical):   Physical Activity:   . Days of Exercise per Week:   . Minutes of Exercise per Session:   Stress:   . Feeling of Stress :  Social Connections:   . Frequency of Communication with Friends and Family:   . Frequency of Social Gatherings with Friends and Family:   . Attends Religious Services:   . Active Member of Clubs or Organizations:   . Attends Archivist Meetings:   Marland Kitchen Marital Status:   Intimate Partner Violence:   . Fear of Current or Ex-Partner:   . Emotionally Abused:   Marland Kitchen Physically Abused:   . Sexually Abused:     Past Surgical History:  Procedure Laterality Date  . ABDOMINAL SURGERY     64 months of age-- unsure of type of surgery  . COLONOSCOPY  04/2016   hemorrhoids--normal per pt with Dr Collene Mares  . HEMORRHOID SURGERY  2005/2006  . LAPAROTOMY N/A 06/03/2019   Procedure: EXPLORATORY LAPAROTOMY WITH LYSIS OF ADHESIONS;  Surgeon: Jovita Kussmaul, MD;  Location: [facility];  Service: General;  Laterality: N/A;  . UTERINE FIBROID EMBOLIZATION      Family History  Problem  Relation Age of Onset  . Cancer Mother        breast  . Stroke Mother   . Cancer Maternal Grandmother        breast  . Hypertension Maternal Grandmother   . Cancer Other        breast, lung  . Hypertension Other   . Stroke Other   . Vision loss Father        some  . Heart disease Father        Rhythm disturbance  . Hypertension Sister   . Hypertension Brother   . Hypertension Brother   . Diabetes Neg Hx     Allergies  Allergen Reactions  . Lisinopril Cough    Current Outpatient Medications on File Prior to Visit  Medication Sig Dispense Refill  . acetaminophen (TYLENOL) 500 MG tablet Take 2 tablets (1,000 mg total) by mouth every 6 (six) hours as needed for mild pain or headache.    . AMBULATORY NON FORMULARY MEDICATION BORIC ACID CAPSULE 600mg . Insert 1 capsule vaginally two times a week. 8 capsule 5  . amLODipine (NORVASC) 10 MG tablet Take 1 tablet by mouth once daily 90 tablet 0  . aspirin EC 81 MG tablet Take 1 tablet (81 mg total) by mouth daily.    Marland Kitchen atorvastatin (LIPITOR) 20 MG tablet Take 1 tablet by mouth once daily 90 tablet 0  . Boric Acid POWD 600mg  in capsule inserted vaginally once nightly as needed. 18 g 5  . docusate sodium (COLACE) 100 MG capsule Take 1 capsule (100 mg total) by mouth daily as needed for mild constipation.    Marland Kitchen glucose blood (ACCU-CHEK GUIDE) test strip 1 each by Other route 3 (three) times daily. Use as instructed to check once daily. 100 each 12  . INVOKANA 300 MG TABS tablet TAKE 1 TABLET BY MOUTH ONCE DAILY BEFORE BREAKFAST . APPOINTMENT REQUIRED FOR FUTURE REFILLS 30 tablet 0  . losartan (COZAAR) 100 MG tablet Take 1 tablet by mouth once daily 90 tablet 0  . methocarbamol (ROBAXIN) 500 MG tablet Take 2 tablets (1,000 mg total) by mouth every 8 (eight) hours as needed for muscle spasms (pain). 60 tablet 0  . metoprolol tartrate (LOPRESSOR) 100 MG tablet Take 1 tablet by mouth twice daily 180 tablet 0  . naproxen (NAPROSYN) 375 MG tablet  Take 1 tablet (375 mg total) by mouth 2 (two) times daily. (Patient taking differently: Take 375 mg by mouth 2 (two) times daily as needed  for mild pain or moderate pain. ) 12 tablet 0  . ONETOUCH DELICA LANCETS FINE MISC Use to check blood sugar 3 times daily 100 each 3  . Polyethyl Glycol-Propyl Glycol (SYSTANE PRESERVATIVE FREE OP) Place 2 drops into both eyes 4 (four) times daily as needed (irritation, dryness).     . polyethylene glycol (MIRALAX / GLYCOLAX) 17 g packet Take 17 g by mouth daily as needed for mild constipation.    Marland Kitchen spironolactone (ALDACTONE) 100 MG tablet Take 1 tablet by mouth once daily 90 tablet 0  . TRULICITY 1.5 0000000 SOPN INJECT 1 SYRINGE SUBCUTANEOUSLY ONCE A WEEK 4 mL 0  . Vitamin D, Ergocalciferol, (DRISDOL) 1.25 MG (50000 UNIT) CAPS capsule Take 50,000 Units by mouth once a week.     No current facility-administered medications on file prior to visit.    BP 127/65 (BP Location: Right Arm, Patient Position: Sitting, Cuff Size: Large)   Pulse 84   Temp (!) 96.1 F (35.6 C) (Temporal)   Resp 16   Ht 5\' 3"  (1.6 m)   Wt 200 lb (90.7 kg)   SpO2 100%   BMI 35.43 kg/m       Objective:   Physical Exam Constitutional:      Appearance: She is well-developed.  Neck:     Thyroid: No thyromegaly.  Cardiovascular:     Rate and Rhythm: Normal rate and regular rhythm.     Heart sounds: Normal heart sounds. No murmur.  Pulmonary:     Effort: Pulmonary effort is normal. No respiratory distress.     Breath sounds: Normal breath sounds. No wheezing.  Musculoskeletal:     Cervical back: Neck supple.  Skin:    General: Skin is warm and dry.  Neurological:     Mental Status: She is alert and oriented to person, place, and time.  Psychiatric:        Behavior: Behavior normal.        Thought Content: Thought content normal.        Judgment: Judgment normal.           Assessment & Plan:  HTN- BP stable. Continue current meds.  DM2- sugar at goal-  management per endocrinology.  Hx of SVT- will arrange follow up with EP. Continue metoprolol.  Hyperlipidemia- tolerating statin, obtain follow up lipid panel.  Dysmenorrhea- advised pt to follow up with her GYN.    Vit D deficiency- completed weekly supplement, not currently on otc supplement. Will obtain follow up vit D.   She tells me plans to get the covid vaccine.   This visit occurred during the SARS-CoV-2 public health emergency.  Safety protocols were in place, including screening questions prior to the visit, additional usage of staff PPE, and extensive cleaning of exam room while observing appropriate contact time as indicated for disinfecting solutions.

## 2019-10-13 LAB — VITAMIN D 1,25 DIHYDROXY
Vitamin D 1, 25 (OH)2 Total: 72 pg/mL (ref 18–72)
Vitamin D2 1, 25 (OH)2: 29 pg/mL
Vitamin D3 1, 25 (OH)2: 43 pg/mL

## 2019-10-16 ENCOUNTER — Encounter: Payer: Self-pay | Admitting: Family

## 2019-10-23 ENCOUNTER — Other Ambulatory Visit: Payer: Self-pay

## 2019-10-23 ENCOUNTER — Ambulatory Visit (INDEPENDENT_AMBULATORY_CARE_PROVIDER_SITE_OTHER): Payer: 59 | Admitting: Internal Medicine

## 2019-10-23 ENCOUNTER — Encounter: Payer: Self-pay | Admitting: Internal Medicine

## 2019-10-23 VITALS — BP 122/72 | HR 78 | Ht 63.0 in | Wt 208.0 lb

## 2019-10-23 DIAGNOSIS — I471 Supraventricular tachycardia: Secondary | ICD-10-CM

## 2019-10-23 NOTE — Progress Notes (Signed)
HPI Ms. Dragovich returns today after a 2 year absence for evaluation of SVT. She has been in the ED with recurrent SVT. Since then she has had occaisional spells. She notes that the spells start and stop suddenly. No chest pain. She has become increasingly anxious about her spells and has curtailed her physical activity out of concern for going into SVT.  Allergies  Allergen Reactions  . Lisinopril Cough     Current Outpatient Medications  Medication Sig Dispense Refill  . acetaminophen (TYLENOL) 500 MG tablet Take 2 tablets (1,000 mg total) by mouth every 6 (six) hours as needed for mild pain or headache.    Marland Kitchen amLODipine (NORVASC) 10 MG tablet Take 1 tablet by mouth once daily 90 tablet 0  . aspirin EC 81 MG tablet Take 1 tablet (81 mg total) by mouth daily.    Marland Kitchen atorvastatin (LIPITOR) 20 MG tablet Take 1 tablet by mouth once daily 90 tablet 0  . Boric Acid POWD 600mg  in capsule inserted vaginally once nightly as needed. 18 g 5  . docusate sodium (COLACE) 100 MG capsule Take 1 capsule (100 mg total) by mouth daily as needed for mild constipation.    Marland Kitchen glucose blood (ACCU-CHEK GUIDE) test strip 1 each by Other route 3 (three) times daily. Use as instructed to check once daily. 100 each 12  . INVOKANA 300 MG TABS tablet TAKE 1 TABLET BY MOUTH ONCE DAILY BEFORE BREAKFAST . APPOINTMENT REQUIRED FOR FUTURE REFILLS 30 tablet 0  . losartan (COZAAR) 100 MG tablet Take 1 tablet by mouth once daily 90 tablet 0  . metoprolol tartrate (LOPRESSOR) 100 MG tablet Take 100 mg by mouth daily.    Glory Rosebush DELICA LANCETS FINE MISC Use to check blood sugar 3 times daily 100 each 3  . Polyethyl Glycol-Propyl Glycol (SYSTANE PRESERVATIVE FREE OP) Place 2 drops into both eyes 4 (four) times daily as needed (irritation, dryness).     . polyethylene glycol (MIRALAX / GLYCOLAX) 17 g packet Take 17 g by mouth daily as needed for mild constipation.    Marland Kitchen spironolactone (ALDACTONE) 100 MG tablet Take 1 tablet by  mouth once daily 90 tablet 0  . TRULICITY 1.5 YC/1.4GY SOPN INJECT 1 SYRINGE SUBCUTANEOUSLY ONCE A WEEK 4 mL 0   No current facility-administered medications for this visit.     Past Medical History:  Diagnosis Date  . Allergy    allergic rhinitis  . Breast discharge    left milky spontaneous x 1 month with pain  . Diabetes mellitus    type II  . FHx: hypertension   . Gastric ulcer   . GERD (gastroesophageal reflux disease)   . H/O constipation   . H/O hemorrhoids   . H/O varicella   . History of small bowel obstruction 06/02/2019  . Hyperlipidemia   . Hypertension   . Hypokalemia   . Increased BMI 06/2010  . Primary hyperaldosteronism ([place]) 02/21/2012  . SVT (supraventricular tachycardia) ([place])    Noted 11/2011 admission  . Thrombocytopenia ([place])   . Thyroid fullness 08/2005  . Urinary incontinence     ROS:   All systems reviewed and negative except as noted in the HPI.   Past Surgical History:  Procedure Laterality Date  . ABDOMINAL SURGERY     30 months of age-- unsure of type of surgery  . COLONOSCOPY  04/2016   hemorrhoids--normal per pt with Dr Collene Mares  . HEMORRHOID SURGERY  2005/2006  .  LAPAROTOMY N/A 06/03/2019   Procedure: EXPLORATORY LAPAROTOMY WITH LYSIS OF ADHESIONS;  Surgeon: Jovita Kussmaul, MD;  Location: [facility];  Service: General;  Laterality: N/A;  . UTERINE FIBROID EMBOLIZATION       Family History  Problem Relation Age of Onset  . Cancer Mother        breast  . Stroke Mother   . Cancer Maternal Grandmother        breast  . Hypertension Maternal Grandmother   . Cancer Other        breast, lung  . Hypertension Other   . Stroke Other   . Vision loss Father        some  . Heart disease Father        Rhythm disturbance  . Hypertension Sister   . Hypertension Brother   . Hypertension Brother   . Diabetes Neg Hx      Social History   Socioeconomic History  . Marital status: Single    Spouse name: Not on file  . Number of children: 2  .  Years of education: Not on file  . Highest education level: Not on file  Occupational History  . Occupation: YOUTH EMPLOYMENT    Employer: [employer]  Tobacco Use  . Smoking status: Never Smoker  . Smokeless tobacco: Never Used  Substance and Sexual Activity  . Alcohol use: No  . Drug use: No  . Sexual activity: Not on file  Other Topics Concern  . Not on file  Social History Narrative   Works at [employer] Strain:   . Difficulty of Paying Living Expenses:   Food Insecurity:   . Worried About Charity fundraiser in the Last Year:   . Arboriculturist in the Last Year:   Transportation Needs:   . Film/video editor (Medical):   Marland Kitchen Lack of Transportation (Non-Medical):   Physical Activity:   . Days of Exercise per Week:   . Minutes of Exercise per Session:   Stress:   . Feeling of Stress :   Social Connections:   . Frequency of Communication with Friends and Family:   . Frequency of Social Gatherings with Friends and Family:   . Attends Religious Services:   . Active Member of Clubs or Organizations:   . Attends Archivist Meetings:   Marland Kitchen Marital Status:   Intimate Partner Violence:   . Fear of Current or Ex-Partner:   . Emotionally Abused:   Marland Kitchen Physically Abused:   . Sexually Abused:      BP 122/72   Pulse 78   Ht 5\' 3"  (1.6 m)   Wt 208 lb (94.3 kg)   SpO2 98%   BMI 36.85 kg/m   Physical Exam:  Well appearing overweight 49 yo woman, NAD HEENT: Unremarkable Neck:  No JVD, no thyromegally Lymphatics:  No adenopathy Back:  No CVA tenderness Lungs:  Clear with no wheezes HEART:  Regular rate rhythm, no murmurs, no rubs, no clicks Abd:  soft, positive bowel sounds, no organomegally, no rebound, no guarding Ext:  2 plus pulses, no edema, no cyanosis, no clubbing Skin:  No rashes no nodules Neuro:  CN II through XII intact, motor grossly intact  Assess/Plan: 1. Recurrent SVT -  despite medical therapy, she continues to be symptomatic. I have discussed the indications/risks/benefits/goals/expectations of catheter ablation. She will call us if she  wishes to proceed with ablation.  2. HTN - she will need something for her bp is we stop her metoprolol after ablation.  Mikle Bosworth.D.

## 2019-10-23 NOTE — Patient Instructions (Addendum)
Medication Instructions:  Your physician recommends that you continue on your current medications as directed. Please refer to the Current Medication list given to you today.  Labwork: None ordered.  Testing/Procedures: Your physician has recommended that you have an ablation. Catheter ablation is a medical procedure used to treat some cardiac arrhythmias (irregular heartbeats). During catheter ablation, a long, thin, flexible tube is put into a blood vessel in your groin (upper thigh), or neck. This tube is called an ablation catheter. It is then guided to your heart through the blood vessel. Radio frequency waves destroy small areas of heart tissue where abnormal heartbeats may cause an arrhythmia to start. Please see the instruction sheet given to you today.   Follow-Up:  The following dates are available for procedures:  June 18, 28 July 1, 7, 9, 13, 16, 26, 29   Any Other Special Instructions Will Be Listed Below (If Applicable).  If you need a refill on your cardiac medications before your next appointment, please call your pharmacy.    Cardiac Ablation Cardiac ablation is a procedure to disable (ablate) a small amount of heart tissue in very specific places. The heart has many electrical connections. Sometimes these connections are abnormal and can cause the heart to beat very fast or irregularly. Ablating some of the problem areas can improve the heart rhythm or return it to normal. Ablation may be done for people who:  Have Wolff-Parkinson-White syndrome.  Have fast heart rhythms (tachycardia).  Have taken medicines for an abnormal heart rhythm (arrhythmia) that were not effective or caused side effects.  Have a high-risk heartbeat that may be life-threatening. During the procedure, a small incision is made in the neck or the groin, and a long, thin, flexible tube (catheter) is inserted into the incision and moved to the heart. Small devices (electrodes) on the tip of the  catheter will send out electrical currents. A type of X-ray (fluoroscopy) will be used to help guide the catheter and to provide images of the heart. Tell a health care provider about:  Any allergies you have.  All medicines you are taking, including vitamins, herbs, eye drops, creams, and over-the-counter medicines.  Any problems you or family members have had with anesthetic medicines.  Any blood disorders you have.  Any surgeries you have had.  Any medical conditions you have, such as kidney failure.  Whether you are pregnant or may be pregnant. What are the risks? Generally, this is a safe procedure. However, problems may occur, including:  Infection.  Bruising and bleeding at the catheter insertion site.  Bleeding into the chest, especially into the sac that surrounds the heart. This is a serious complication.  Stroke or blood clots.  Damage to other structures or organs.  Allergic reaction to medicines or dyes.  Need for a permanent pacemaker if the normal electrical system is damaged. A pacemaker is a small computer that sends electrical signals to the heart and helps your heart beat normally.  The procedure not being fully effective. This may not be recognized until months later. Repeat ablation procedures are sometimes required. What happens before the procedure?  Follow instructions from your health care provider about eating or drinking restrictions.  Ask your health care provider about: ? Changing or stopping your regular medicines. This is especially important if you are taking diabetes medicines or blood thinners. ? Taking medicines such as aspirin and ibuprofen. These medicines can thin your blood. Do not take these medicines before your procedure if your health  care provider instructs you not to.  Plan to have someone take you home from the hospital or clinic.  If you will be going home right after the procedure, plan to have someone with you for 24  hours. What happens during the procedure?  To lower your risk of infection: ? Your health care team will wash or sanitize their hands. ? Your skin will be washed with soap. ? Hair may be removed from the incision area.  An IV tube will be inserted into one of your veins.  You will be given a medicine to help you relax (sedative).  The skin on your neck or groin will be numbed.  An incision will be made in your neck or your groin.  A needle will be inserted through the incision and into a large vein in your neck or groin.  A catheter will be inserted into the needle and moved to your heart.  Dye may be injected through the catheter to help your surgeon see the area of the heart that needs treatment.  Electrical currents will be sent from the catheter to ablate heart tissue in desired areas. There are three types of energy that may be used to ablate heart tissue: ? Heat (radiofrequency energy). ? Laser energy. ? Extreme cold (cryoablation).  When the necessary tissue has been ablated, the catheter will be removed.  Pressure will be held on the catheter insertion area to prevent excessive bleeding.  A bandage (dressing) will be placed over the catheter insertion area. The procedure may vary among health care providers and hospitals. What happens after the procedure?  Your blood pressure, heart rate, breathing rate, and blood oxygen level will be monitored until the medicines you were given have worn off.  Your catheter insertion area will be monitored for bleeding. You will need to lie still for a few hours to ensure that you do not bleed from the catheter insertion area.  Do not drive for 24 hours or as long as directed by your health care provider. Summary  Cardiac ablation is a procedure to disable (ablate) a small amount of heart tissue in very specific places. Ablating some of the problem areas can improve the heart rhythm or return it to normal.  During the procedure,  electrical currents will be sent from the catheter to ablate heart tissue in desired areas. This information is not intended to replace advice given to you by your health care provider. Make sure you discuss any questions you have with your health care provider. Document Revised: 10/24/2017 Document Reviewed: 03/22/2016 Elsevier Patient Education  [place].

## 2019-10-30 ENCOUNTER — Other Ambulatory Visit: Payer: Self-pay

## 2019-10-30 MED ORDER — TRULICITY 1.5 MG/0.5ML ~~LOC~~ SOAJ: SUBCUTANEOUS | 2 refills | Status: DC

## 2019-11-23 ENCOUNTER — Other Ambulatory Visit: Payer: Self-pay | Admitting: Endocrinology

## 2020-01-08 DIAGNOSIS — N8000 Endometriosis of the uterus, unspecified: Secondary | ICD-10-CM | POA: Insufficient documentation

## 2020-01-08 DIAGNOSIS — N8003 Adenomyosis of the uterus: Secondary | ICD-10-CM | POA: Insufficient documentation

## 2020-01-08 DIAGNOSIS — N840 Polyp of corpus uteri: Secondary | ICD-10-CM | POA: Insufficient documentation

## 2020-01-08 DIAGNOSIS — N939 Abnormal uterine and vaginal bleeding, unspecified: Secondary | ICD-10-CM | POA: Insufficient documentation

## 2020-01-19 ENCOUNTER — Other Ambulatory Visit: Payer: Self-pay | Admitting: Endocrinology

## 2020-01-22 ENCOUNTER — Other Ambulatory Visit: Payer: Self-pay | Admitting: Obstetrics & Gynecology

## 2020-01-22 ENCOUNTER — Other Ambulatory Visit: Payer: 59

## 2020-01-24 ENCOUNTER — Other Ambulatory Visit (INDEPENDENT_AMBULATORY_CARE_PROVIDER_SITE_OTHER): Payer: 59

## 2020-01-24 ENCOUNTER — Other Ambulatory Visit: Payer: Self-pay

## 2020-01-24 ENCOUNTER — Ambulatory Visit: Payer: 59 | Admitting: Endocrinology

## 2020-01-24 DIAGNOSIS — E669 Obesity, unspecified: Secondary | ICD-10-CM | POA: Diagnosis not present

## 2020-01-24 DIAGNOSIS — E1169 Type 2 diabetes mellitus with other specified complication: Secondary | ICD-10-CM | POA: Diagnosis not present

## 2020-01-24 LAB — BASIC METABOLIC PANEL
BUN: 24 mg/dL — ABNORMAL HIGH (ref 6–23)
CO2: 31 mEq/L (ref 19–32)
Calcium: 9.7 mg/dL (ref 8.4–10.5)
Chloride: 101 mEq/L (ref 96–112)
Creatinine, Ser: 0.93 mg/dL (ref 0.40–1.20)
GFR: 76.94 mL/min (ref >60.00)
Glucose, Bld: 105 mg/dL — ABNORMAL HIGH (ref 70–99)
Potassium: 4.3 mEq/L (ref 3.5–5.1)
Sodium: 138 mEq/L (ref 135–145)

## 2020-01-24 LAB — HEMOGLOBIN A1C: Hgb A1c MFr Bld: 6 % (ref 4.6–6.5)

## 2020-01-29 ENCOUNTER — Other Ambulatory Visit: Payer: Self-pay | Admitting: Obstetrics & Gynecology

## 2020-02-05 ENCOUNTER — Other Ambulatory Visit (HOSPITAL_COMMUNITY): Payer: 59

## 2020-02-05 ENCOUNTER — Encounter (HOSPITAL_COMMUNITY)
Admission: RE | Admit: 2020-02-05 | Discharge: 2020-02-05 | Disposition: A | Discharge status: Home / Self Care | Payer: 59 | Source: Ambulatory Visit | Attending: Obstetrics & Gynecology | Admitting: Obstetrics & Gynecology

## 2020-02-05 ENCOUNTER — Telehealth (INDEPENDENT_AMBULATORY_CARE_PROVIDER_SITE_OTHER): Payer: 59 | Admitting: Endocrinology

## 2020-02-05 ENCOUNTER — Other Ambulatory Visit (HOSPITAL_COMMUNITY)
Admission: RE | Admit: 2020-02-05 | Discharge: 2020-02-05 | Disposition: A | Discharge status: Home / Self Care | Payer: 59 | Source: Ambulatory Visit | Attending: Obstetrics & Gynecology | Admitting: Obstetrics & Gynecology

## 2020-02-05 ENCOUNTER — Other Ambulatory Visit: Payer: Self-pay

## 2020-02-05 VITALS — Ht 63.0 in | Wt 208.0 lb

## 2020-02-05 DIAGNOSIS — Z20822 Contact with and (suspected) exposure to covid-19: Secondary | ICD-10-CM | POA: Insufficient documentation

## 2020-02-05 DIAGNOSIS — E669 Obesity, unspecified: Secondary | ICD-10-CM

## 2020-02-05 DIAGNOSIS — E7800 Pure hypercholesterolemia, unspecified: Secondary | ICD-10-CM | POA: Diagnosis not present

## 2020-02-05 DIAGNOSIS — E1169 Type 2 diabetes mellitus with other specified complication: Secondary | ICD-10-CM | POA: Diagnosis not present

## 2020-02-05 DIAGNOSIS — Z01812 Encounter for preprocedural laboratory examination: Secondary | ICD-10-CM | POA: Insufficient documentation

## 2020-02-05 LAB — CBC
HCT: 41.8 % (ref 36.0–46.0)
Hemoglobin: 13.6 g/dL (ref 12.0–15.0)
MCH: 29.4 pg (ref 26.0–34.0)
MCHC: 32.5 g/dL (ref 30.0–36.0)
MCV: 90.5 fL (ref 80.0–100.0)
Platelets: 436 10*3/uL — ABNORMAL HIGH (ref 150–400)
RBC: 4.62 MIL/uL (ref 3.87–5.11)
RDW: 13.1 % (ref 11.5–15.5)
WBC: 7.1 10*3/uL (ref 4.0–10.5)
nRBC: 0 % (ref 0.0–0.2)

## 2020-02-05 LAB — BASIC METABOLIC PANEL
Anion gap: 12 (ref 5–15)
BUN: 20 mg/dL (ref 6–20)
CO2: 28 mmol/L (ref 22–32)
Calcium: 9.7 mg/dL (ref 8.9–10.3)
Chloride: 98 mmol/L (ref 98–111)
Creatinine, Ser: 0.91 mg/dL (ref 0.44–1.00)
GFR calc Af Amer: >60 mL/min (ref >60)
GFR calc non Af Amer: >60 mL/min (ref >60)
Glucose, Bld: 101 mg/dL — ABNORMAL HIGH (ref 70–99)
Potassium: 4 mmol/L (ref 3.5–5.1)
Sodium: 138 mmol/L (ref 135–145)

## 2020-02-05 LAB — SARS CORONAVIRUS 2 (TAT 6-24 HRS): SARS Coronavirus 2: NEGATIVE

## 2020-02-05 NOTE — Progress Notes (Signed)
Patient ID: Susan Whitaker, female   DOB: Nov 15, 1970, 49 y.o.   MRN: [id number]          I connected with the above-named patient by video enabled telemedicine application and verified that I am speaking with the correct person. The patient was explained the limitations of evaluation and management by telemedicine and the availability of in person appointments.  Patient also understood that there may be a patient responsible charge related to this service . Location of the patient: Patient's home . Location of the provider: Physician office Only the patient and myself were participating in the encounter The patient understood the above statements and agreed to proceed.  Reason for Appointment: Follow-up for Type 2 Diabetes  Referring physician:  Debbrah Alar   History of Present Illness:          Date of diagnosis of type 2 diabetes mellitus: 2013        Background history:  She was diagnosed to have diabetes when she was having fibroid surgery. A1c in 2013 was 6.9 She was treated with metformin but she says she was not able to take this because of diarrhea and would be irregular with it However her blood sugars stayed about the same until 2015 when they were higher and glipizide added Her blood sugars had been much higher since about late 2015 with A1c in the 9-10 range with her taking glipizide alone  Recent history:   Non-insulin hypoglycemic drugs: Invokana 132 mg daily, Trulicity 1.5 mg weekly  Current management, levels of control, and problems identified:  Her A1c had gone up to 8.1 in November 2019 but has been below 6% subsequently  A1c is now 6 compared to 5.7   She does not like to check her blood sugars as before  Blood sugars in the lab are fairly good with recent reading 101 and earlier this month 105  She thinks she is concerned about her A1c starting to go up  However she has not done any exercise  Also again she thinks that she is not gaining enough  satiety from Trulicity  No yeast infection with Invokana which she takes regularly  Weight appears to be about the same as in June  Side effects from medications have been: Diarrhea from regular metformin   Glucose monitoring: None    Blood Glucose readings not checked   Self-care: The diet that the patient has been following is: tries to limit Portions .     Typical meal intake: Breakfast is  fruit, Activia Yogurt,  For snacks she will have Snack box, chicken breast at dinnertime.  No Sweet drinks                  Dietician visit, most recent: 5/17, has  had diabetes education classes                Weight history:  Wt Readings from Last 3 Encounters:  02/05/20 208 lb (94.3 kg)  10/23/19 208 lb (94.3 kg)  10/10/19 200 lb (90.7 kg)    Glycemic control:   Lab Results  Component Value Date   HGBA1C 6.0 01/24/2020   HGBA1C 5.7 08/02/2019   HGBA1C 5.6 06/02/2019   Lab Results  Component Value Date   MICROALBUR 1.0 04/05/2019   LDLCALC 129 (H) 10/10/2019   CREATININE 0.91 02/05/2020   Lab Results  Component Value Date   MICRALBCREAT 0.8 04/05/2019   Hospital Outpatient Visit on 02/05/2020  Component Date Value Ref  Range Status  . WBC 02/05/2020 7.1  4.0 - 10.5 K/uL Final  . RBC 02/05/2020 4.62  3.87 - 5.11 MIL/uL Final  . Hemoglobin 02/05/2020 13.6  12.0 - 15.0 g/dL Final  . HCT 02/05/2020 41.8  36 - 46 % Final  . MCV 02/05/2020 90.5  80.0 - 100.0 fL Final  . MCH 02/05/2020 29.4  26.0 - 34.0 pg Final  . MCHC 02/05/2020 32.5  30.0 - 36.0 g/dL Final  . RDW 02/05/2020 13.1  11.5 - 15.5 % Final  . Platelets 02/05/2020 436* 150 - 400 K/uL Final  . nRBC 02/05/2020 0.0  0.0 - 0.2 % Final   Performed at [facility], [place] [address] [place], [place] [address]  . ABO/RH(D) 02/05/2020 O POS   Final  . Antibody Screen 02/05/2020 NEG   Final  . Sample Expiration 02/05/2020 02/19/2020,2359   Final  . Extend sample reason 02/05/2020    Final                    Value:NO TRANSFUSIONS OR PREGNANCY IN THE PAST 3 MONTHS Performed at [facility], [place] [address], [place], [place] [address]   . Sodium 02/05/2020 138  135 - 145 mmol/L Final  . Potassium 02/05/2020 4.0  3.5 - 5.1 mmol/L Final  . Chloride 02/05/2020 98  98 - 111 mmol/L Final  . CO2 02/05/2020 28  22 - 32 mmol/L Final  . Glucose, Bld 02/05/2020 101* 70 - 99 mg/dL Final   Glucose reference range applies only to samples taken after fasting for at least 8 hours.  . BUN 02/05/2020 20  6 - 20 mg/dL Final  . Creatinine, Ser 02/05/2020 0.91  0.44 - 1.00 mg/dL Final  . Calcium 02/05/2020 9.7  8.9 - 10.3 mg/dL Final  . GFR calc non Af Amer 02/05/2020 >60  >60 mL/min Final  . GFR calc Af Amer 02/05/2020 >60  >60 mL/min Final  . Anion gap 02/05/2020 12  5 - 15 Final   Performed at [facility], [place] [address], [place], [place] [address]     Other active problems are in review of systems    Allergies as of 02/05/2020      Reactions   Lisinopril Cough      Medication List       Accurate as of February 05, 2020 12:57 PM. If you have any questions, ask your nurse or doctor.        acetaminophen 500 MG tablet Commonly known as: TYLENOL Take 2 tablets (1,000 mg total) by mouth every 6 (six) hours as needed for mild pain or headache.   amLODipine 10 MG tablet Commonly known as: NORVASC Take 1 tablet by mouth once daily   aspirin EC 81 MG tablet Take 1 tablet (81 mg total) by mouth daily.   atorvastatin 20 MG tablet Commonly known as: LIPITOR Take 1 tablet by mouth once daily   Boric Acid Powd 600mg  in capsule inserted vaginally once nightly as needed.   canagliflozin 300 MG Tabs tablet Commonly known as: Invokana Take one tablet by mouth once daily before breakfast.   docusate sodium 100 MG capsule Commonly known as: COLACE Take 1 capsule (100 mg total) by mouth daily as needed for mild constipation.   glucose blood test  strip Commonly known as: Accu-Chek Guide 1 each by Other route 3 (three) times daily. Use as instructed to check once daily.   losartan 100 MG tablet Commonly  known as: COZAAR Take 1 tablet by mouth once daily   metoprolol tartrate 100 MG tablet Commonly known as: LOPRESSOR Take 100 mg by mouth daily.   OneTouch Delica Lancets Fine Misc Use to check blood sugar 3 times daily   polyethylene glycol 17 g packet Commonly known as: MIRALAX / GLYCOLAX Take 17 g by mouth daily as needed for mild constipation.   spironolactone 100 MG tablet Commonly known as: ALDACTONE Take 1 tablet by mouth once daily   SYSTANE PRESERVATIVE FREE OP Place 2 drops into both eyes 4 (four) times daily as needed (irritation, dryness).   Trulicity 1.5 TI/4.5YK Sopn Generic drug: Dulaglutide INJECT 1.5MG  UNDER THE SKIN ONCE WEEKLY       Allergies:  Allergies  Allergen Reactions  . Lisinopril Cough    Past Medical History:  Diagnosis Date  . Allergy    allergic rhinitis  . Breast discharge    left milky spontaneous x 1 month with pain  . Diabetes mellitus    type II  . FHx: hypertension   . Gastric ulcer   . GERD (gastroesophageal reflux disease)   . H/O constipation   . H/O hemorrhoids   . H/O varicella   . History of small bowel obstruction 06/02/2019  . Hyperlipidemia   . Hypertension   . Hypokalemia   . Increased BMI 06/2010  . Primary hyperaldosteronism ([place]) 02/21/2012  . SVT (supraventricular tachycardia) ([place])    Noted 11/2011 admission  . Thrombocytopenia ([place])   . Thyroid fullness 08/2005  . Urinary incontinence     Past Surgical History:  Procedure Laterality Date  . ABDOMINAL SURGERY     75 months of age-- unsure of type of surgery  . COLONOSCOPY  04/2016   hemorrhoids--normal per pt with Dr Collene Mares  . HEMORRHOID SURGERY  2005/2006  . LAPAROTOMY N/A 06/03/2019   Procedure: EXPLORATORY LAPAROTOMY WITH LYSIS OF ADHESIONS;  Surgeon: Jovita Kussmaul, MD;  Location: [facility];   Service: General;  Laterality: N/A;  . UTERINE FIBROID EMBOLIZATION      Family History  Problem Relation Age of Onset  . Cancer Mother        breast  . Stroke Mother   . Cancer Maternal Grandmother        breast  . Hypertension Maternal Grandmother   . Cancer Other        breast, lung  . Hypertension Other   . Stroke Other   . Vision loss Father        some  . Heart disease Father        Rhythm disturbance  . Hypertension Sister   . Hypertension Brother   . Hypertension Brother   . Diabetes Neg Hx     Social History:  reports that she has never smoked. She has never used smokeless tobacco. She reports that she does not drink alcohol and does not use drugs.    Review of Systems    Lipid history: LDL 171 At baseline Lipitor 20 mg prescribed by PCP Last LDL was above target She is not understanding the need to take her Lipitor regularly and has not taken it    Lab Results  Component Value Date   CHOL 193 10/10/2019   HDL 41.80 10/10/2019   LDLCALC 129 (H) 10/10/2019   LDLDIRECT 103.1 03/04/2014   TRIG 110.0 10/10/2019   CHOLHDL 5 10/10/2019           Hypertension:   She is on amlodipine, metoprolol and  losartan 100 mg prescribed by her PCP  Because of hypokalemia she is also on 100 mg of Aldactone, last potassium normal     BP Readings from Last 3 Encounters:  02/05/20 (!) 169/92  10/23/19 122/72  10/10/19 127/65    Lab Results  Component Value Date   CREATININE 0.91 02/05/2020   BUN 20 02/05/2020   NA 138 02/05/2020   K 4.0 02/05/2020   CL 98 02/05/2020   CO2 28 02/05/2020     History of goiter with multiple nodules, stable as of 2015, largest 1.4 cm nodule in the right side  Lab Results  Component Value Date   TSH 1.99 08/02/2019     Most recent foot exam: 03/2019 Eye exam normal in 02/2019   Review of Systems    Physical Examination:  There were no vitals taken for this visit.  No ankle edema present      ASSESSMENT/PLAN:  Diabetes type 2, obese  See history of present illness for detailed discussion of current diabetes management, blood sugar patterns and problems identified  Her A1c is continuing to be around 6%  She is on a regimen with Trulicity 1.5, Invokana 300 mg   Her blood sugars are generally well controlled as judged by the labs However she can increase her efforts to lose weight Although she wants to take prescription semaglutide for weight loss this is not covered by her insurance  She does need to start a walking program which she thinks she can do once she has her gynecological problems taking care of She will increase her Trulicity to 3 mg to improve her satiety and provide better weight loss also  HYPERTENSION:  She will need to follow-up with her PCP for an office blood pressure measurement Stand Aldactone for blood pressure benefit and hypokalemia   Lipids: Will need for her to restart Lipitor and discussed importance of taking atorvastatin long-term for LDL control and risk reduction  Follow-up in 3 months      There are no Patient Instructions on file for this visit.  Elayne Snare 02/05/2020, 12:57 PM   Note: This office note was prepared with Dragon voice recognition system technology. Any transcriptional errors that result from this process are unintentional.

## 2020-02-06 ENCOUNTER — Encounter (HOSPITAL_BASED_OUTPATIENT_CLINIC_OR_DEPARTMENT_OTHER): Payer: Self-pay | Admitting: Obstetrics & Gynecology

## 2020-02-06 ENCOUNTER — Other Ambulatory Visit: Payer: Self-pay

## 2020-02-06 MED ORDER — TRULICITY 3 MG/0.5ML ~~LOC~~ SOAJ: 3.0000 mg | SUBCUTANEOUS | 2 refills | Status: DC

## 2020-02-06 NOTE — Progress Notes (Addendum)
Addendum: spoke with Susan zanetto pa meets [facility] guidelines   Spoke w/ via phone for pre-op interview---pt Lab needs dos----urine preg               Lab results------cbc type and screen , bmet 02-05-2020 epic COVID test ------02-05-2020 1045 Arrive at -------1015 am 02-08-2020 NPO after MN NO Solid Food.  Clear liquids from MN until--915 am then npo- Medications to take morning of surgery -----amlodipine, atorvastatin, metorpolol tartrate Diabetic medication -----none day of surgery Patient Special Instructions -----none Pre-Op special Istructions -----none Patient verbalized understanding of instructions that were given at this phone interview. Patient denies shortness of breath, chest pain, fever, cough at this phone interview.  Anesthesia : lov dr Lovena Le 10-23-2019 for svt, considering ablation, htn dm type s  pt denies any trouble with stairs or current sob, had surgery exploratory lap 06-03-2019 [facility] or without problems.  Chart to Janett Billow zanetto pa for review  PCP: dr Lemar Livings Cardiologist : dr Smitty Cords 10-23-2019 Chest x-ray :none EKG :07-13-2019 epic Echo : none Stress test:none Cardiac Cath : none Activity level: able to climb steps without problems does housework, works full time Sleep Study/ CPAP :none Fasting Blood Sugar :105      / Checks Blood Sugar2 -- times a day:   Blood Thinner/ Instructions /Last Dose:n/a ASA / Instructions/ Last Dose : pt not told to stop 81 mg aspirin per dr Phylliss Bob, last dose will be 02-07-2020

## 2020-02-07 ENCOUNTER — Other Ambulatory Visit: Payer: Self-pay | Admitting: Obstetrics & Gynecology

## 2020-02-07 NOTE — Progress Notes (Signed)
Voicemail left for Stacy, surgery scheduler for Dr. Alesia Richards, requesting for abbreviations on consent order to be spelled out.

## 2020-02-07 NOTE — H&P (Signed)
Susan Whitaker is an 49 y.o. female para 2 with a history of menometrorrhagia, endometrial lesion suspect endometrial  polyp, fibroid uterus, adenomyosis here for dilation and currettage hysteroscopy and polypectomy, possible myosure and Mirena IUD insertion under ultrasound guidance.   Pertinent Gynecological History: Menses: flow is heavy Bleeding: heavy, irregular Contraception: none DES exposure: unknown Blood transfusions: none Sexually transmitted diseases: no past history Previous GYN Procedures: None  Last mammogram: normal Date: 03/2019 Last pap: normal Date: 09/23/2015 OB History: G4, P2022   Menstrual History: LMP 01/06/2020    Past Medical History:  Diagnosis Date  . Allergy    allergic rhinitis  . DM type 2 (diabetes mellitus, type 2) ([place])   . FHx: hypertension   . Gastric ulcer   . GERD (gastroesophageal reflux disease)   . H/O constipation   . H/O hemorrhoids   . H/O varicella   . History of small bowel obstruction 06/02/2019  . Hyperlipidemia   . Hypertension   . Hypokalemia 2019  . Increased BMI 06/2010  . Menorrhagia   . Primary hyperaldosteronism ([place]) 02/21/2012  . SVT (supraventricular tachycardia) ([place])    Noted 11/2011 admission  . Thrombocytopenia ([place])   . Thyroid fullness 08/2005   monitoring no meds for followed by pcp    Past Surgical History:  Procedure Laterality Date  . ABDOMINAL SURGERY     47 months of age-- unsure of type of surgery  . COLONOSCOPY  04/2016   hemorrhoids--normal per pt with Dr Collene Mares  . HEMORRHOID SURGERY  2005/2006  . LAPAROTOMY N/A 06/03/2019   Procedure: EXPLORATORY LAPAROTOMY WITH LYSIS OF ADHESIONS;  Surgeon: Jovita Kussmaul, MD;  Location: [facility];  Service: General;  Laterality: N/A;  . UTERINE FIBROID EMBOLIZATION  2010   at [facility]    Family History  Problem Relation Age of Onset  . Cancer Mother        breast  . Stroke Mother   . Cancer Maternal Grandmother        breast  . Hypertension Maternal Grandmother    . Cancer Other        breast, lung  . Hypertension Other   . Stroke Other   . Vision loss Father        some  . Heart disease Father        Rhythm disturbance  . Hypertension Sister   . Hypertension Brother   . Hypertension Brother   . Diabetes Neg Hx     Social History:  reports that she has never smoked. She has never used smokeless tobacco. She reports that she does not drink alcohol and does not use drugs.  Allergies:  Allergies  Allergen Reactions  . Lisinopril Cough   Current Outpatient Medications  Medication Instructions  . acetaminophen (TYLENOL) 1,000 mg, Oral, Every 6 hours PRN  . amLODipine (NORVASC) 10 MG tablet Take 1 tablet by mouth once daily  . aspirin EC 81 mg, Oral, Daily  . atorvastatin (LIPITOR) 20 MG tablet Take 1 tablet by mouth once daily  . Boric Acid POWD 600mg  in capsule inserted vaginally once nightly as needed.  . canagliflozin (INVOKANA) 300 MG TABS tablet Take one tablet by mouth once daily before breakfast.  . docusate sodium (COLACE) 100 mg, Oral, Daily PRN  . glucose blood (ACCU-CHEK GUIDE) test strip 1 each, Other, 3 times daily, Use as instructed to check once daily.  Marland Kitchen losartan (COZAAR) 100 MG tablet Take 1 tablet by mouth once daily  . metoprolol  tartrate (LOPRESSOR) 100 mg, Oral, Daily  . ONETOUCH DELICA LANCETS FINE MISC Use to check blood sugar 3 times daily  . Polyethyl Glycol-Propyl Glycol (SYSTANE PRESERVATIVE FREE OP) 2 drops, Both Eyes, 4 times daily PRN  . polyethylene glycol (MIRALAX / GLYCOLAX) 17 g, Oral, Daily PRN  . spironolactone (ALDACTONE) 100 MG tablet Take 1 tablet by mouth once daily  . Trulicity 3 mg, Subcutaneous, Weekly    Review of Systems  HENT: Tinnitus:       ROS  Constitutional: Denies fevers/chills Cardiovascular: Denies chest pain or palpitations Pulmonary: Denies coughing or wheezing Gastrointestinal: Denies nausea, vomiting or diarrhea Genitourinary: Denies pelvic pain, unusual vaginal discharge,  dysuria, urgency or frequency.  With unusual vaginal bleeding.   Musculoskeletal: Denies muscle or joint aches and pain.  Neurology: Denies abnormal sensations such as tingling or numbness.    Height 5\' 3"  (1.6 m), weight 94.3 kg.  BMI 36.85.  Blood pressure (!) 143/97, pulse 81, temperature 98.7 F (37.1 C), temperature source Oral, resp. rate 16, height 5\' 3"  (1.6 m), weight 94.3 kg, SpO2 98 %. Physical Exam Vitals reviewed. Constitutional: She is oriented to person, place, and time. She appears well-developed and well-nourished.  HENT:  Head: Normocephalic and atraumatic.  Eyes: Conjunctivae and EOM are normal.  Neck: Normal range of motion. Neck supple.  Cardiovascular: Normal rate, regular rhythm, normal heart sounds and intact distal pulses.  Respiratory: Effort normal and breath sounds normal.  GI: Soft. She exhibits no mass. There is no tenderness.  Genitourinary: Vagina normal and uterus normal.  Musculoskeletal: Normal range of motion.  Neurological: She is alert and oriented to person, place, and time.  Skin: Skin is warm and dry.  Psychiatric: She has a normal mood and affect. Judgment normal.    Recent Results (from the past 2160 hour(s))  Basic metabolic panel     Status: Abnormal   Collection Time: 01/24/20 11:53 AM  Result Value Ref Range   Sodium 138 135 - 145 mEq/L   Potassium 4.3 3.5 - 5.1 mEq/L   Chloride 101 96 - 112 mEq/L   CO2 31 19 - 32 mEq/L   Glucose, Bld 105 (H) 70 - 99 mg/dL   BUN 24 (H) 6 - 23 mg/dL   Creatinine, Ser 0.93 0.40 - 1.20 mg/dL   GFR 76.94 >60.00 mL/min   Calcium 9.7 8.4 - 10.5 mg/dL  Hemoglobin A1c     Status: None   Collection Time: 01/24/20 11:53 AM  Result Value Ref Range   Hgb A1c MFr Bld 6.0 4.6 - 6.5 %    Comment: Glycemic Control Guidelines for People with Diabetes:Non Diabetic:  <6%Goal of Therapy: <7%Additional Action Suggested:  >8%   CBC     Status: Abnormal   Collection Time: 02/05/20 10:17 AM  Result Value Ref Range    WBC 7.1 4.0 - 10.5 K/uL   RBC 4.62 3.87 - 5.11 MIL/uL   Hemoglobin 13.6 12.0 - 15.0 g/dL   HCT 41.8 36 - 46 %   MCV 90.5 80.0 - 100.0 fL   MCH 29.4 26.0 - 34.0 pg   MCHC 32.5 30.0 - 36.0 g/dL   RDW 13.1 11.5 - 15.5 %   Platelets 436 (H) 150 - 400 K/uL   nRBC 0.0 0.0 - 0.2 %    Comment: Performed at [place] [facility], [address] [place], [place] [address]  Type and screen     Status: None   Collection Time: 02/05/20 10:17 AM  Result Value Ref Range   ABO/RH(D) O POS    Antibody Screen NEG    Sample Expiration 02/19/2020,2359    Extend sample reason      NO TRANSFUSIONS OR PREGNANCY IN THE PAST 3 MONTHS Performed at [facility], [place] [address] [place], [place] [address]   Basic metabolic panel per protocol     Status: Abnormal   Collection Time: 02/05/20 10:17 AM  Result Value Ref Range   Sodium 138 135 - 145 mmol/L   Potassium 4.0 3.5 - 5.1 mmol/L   Chloride 98 98 - 111 mmol/L   CO2 28 22 - 32 mmol/L   Glucose, Bld 101 (H) 70 - 99 mg/dL    Comment: Glucose reference range applies only to samples taken after fasting for at least 8 hours.   BUN 20 6 - 20 mg/dL   Creatinine, Ser 0.91 0.44 - 1.00 mg/dL   Calcium 9.7 8.9 - 10.3 mg/dL   GFR calc non Af Amer >60 >60 mL/min   GFR calc Af Amer >60 >60 mL/min   Anion gap 12 5 - 15    Comment: Performed at [facility], [place] [address] [place], [place] [address]  SARS CORONAVIRUS 2 (TAT 6-24 HRS) Nasopharyngeal Nasopharyngeal Swab     Status: None   Collection Time: 02/05/20 10:49 AM   Specimen: Nasopharyngeal Swab  Result Value Ref Range   SARS Coronavirus 2 NEGATIVE NEGATIVE    Comment: (NOTE) SARS-CoV-2 target nucleic acids are NOT DETECTED.  The SARS-CoV-2 RNA is generally detectable in upper and lower respiratory specimens during the acute phase of infection. Negative results do not preclude SARS-CoV-2 infection, do not rule out co-infections with other pathogens, and  should not be used as the sole basis for treatment or other patient management decisions. Negative results must be combined with clinical observations, patient history, and epidemiological information. The expected result is Negative.  Fact Sheet for Patients: SugarRoll.be  Fact Sheet for Healthcare Providers: https://www.woods-mathews.com/  This test is not yet approved or cleared by the [place] FDA and  has been authorized for detection and/or diagnosis of SARS-CoV-2 by FDA under an Emergency Use Authorization (EUA). This EUA will remain  in effect (meaning this test can be used) for the duration of the COVID-19 declaration under Se ction 564(b)(1) of the Act, 21 U.S.C. section 360bbb-3(b)(1), unless the authorization is terminated or revoked sooner.  Performed at [facility], [place] [address] [place],  [address]     Endometrial biopsy 02/07/2020: Benign endocervical and squamous mucosa with abundant blood and mucus.  Scant inactive endometrium.  No atypia or carcinoma.     Pelvic Ultrasound 01/08/2020: EMS 3.47 mm. 2.39 x 0.6 cm endometrial lesion, likely polyp. Uterus 12 x 7.5 x 7.3 cm, transabdominally Uterus measures 14 cm. Heterogenous uterus, possible adenomyosis.  Mid posterior IM fibroid (3x 3x 3 cm).  Mid anterior IM fibroid (1 x 0.9 x 1 cm). Mid posterior IM- calcified (1.6  x 1.5 x 1.7 cm).  Fundal anterior left IM (1.5x 1.7 x 1.2cm).  LUS right IM posterior Vascular homogenous lesion within endometrial canal, possible polyp. Bilateral ovaries not identified due to large uterus.  No free fluid in cul de sac.   Urine pregnancy test 02/08/20: Negative.   Assessment/Plan: 49 y/o Para 2 history of menometrorrhagia, endometrial lesion suspect endometrial  polyp, fibroid uterus, adenomyosis here for dilation and currettage hysteroscopy and polypectomy, possible myosure and Mirena IUD insertion under ultrasound  guidance.    This procedure has been fully reviewed with the patient and written informed consent has been obtained.  We discussed risks of bleeding, infection and damage to organs.  We discussed that the procedure would remove endometrial lesions but that her intramural fibroids and possible adenomyosis would not be removed. We discussed risks of treatment failure.  All her questions were answered and she agreed to proceed with the procedures.   Alinda Dooms, MD 02/07/2020, 6:03 PM

## 2020-02-08 ENCOUNTER — Ambulatory Visit (HOSPITAL_BASED_OUTPATIENT_CLINIC_OR_DEPARTMENT_OTHER): Payer: 59 | Admitting: Physician Assistant

## 2020-02-08 ENCOUNTER — Ambulatory Visit (HOSPITAL_BASED_OUTPATIENT_CLINIC_OR_DEPARTMENT_OTHER): Payer: 59 | Admitting: Anesthesiology

## 2020-02-08 ENCOUNTER — Ambulatory Visit (HOSPITAL_COMMUNITY): Payer: 59

## 2020-02-08 ENCOUNTER — Ambulatory Visit (HOSPITAL_BASED_OUTPATIENT_CLINIC_OR_DEPARTMENT_OTHER)
Admission: RE | Admit: 2020-02-08 | Discharge: 2020-02-08 | Disposition: A | Discharge status: Home / Self Care | Payer: 59 | Attending: Obstetrics & Gynecology | Admitting: Obstetrics & Gynecology

## 2020-02-08 ENCOUNTER — Encounter (HOSPITAL_BASED_OUTPATIENT_CLINIC_OR_DEPARTMENT_OTHER)
Admission: RE | Disposition: A | Discharge status: Home / Self Care | Payer: Self-pay | Source: Home / Self Care | Attending: Obstetrics & Gynecology

## 2020-02-08 ENCOUNTER — Encounter (HOSPITAL_BASED_OUTPATIENT_CLINIC_OR_DEPARTMENT_OTHER): Payer: Self-pay | Admitting: Obstetrics & Gynecology

## 2020-02-08 ENCOUNTER — Other Ambulatory Visit: Payer: Self-pay

## 2020-02-08 DIAGNOSIS — H9319 Tinnitus, unspecified ear: Secondary | ICD-10-CM | POA: Diagnosis not present

## 2020-02-08 DIAGNOSIS — Z820 Family history of epilepsy and other diseases of the nervous system: Secondary | ICD-10-CM | POA: Insufficient documentation

## 2020-02-08 DIAGNOSIS — Z7982 Long term (current) use of aspirin: Secondary | ICD-10-CM | POA: Insufficient documentation

## 2020-02-08 DIAGNOSIS — E669 Obesity, unspecified: Secondary | ICD-10-CM | POA: Insufficient documentation

## 2020-02-08 DIAGNOSIS — K219 Gastro-esophageal reflux disease without esophagitis: Secondary | ICD-10-CM | POA: Insufficient documentation

## 2020-02-08 DIAGNOSIS — F419 Anxiety disorder, unspecified: Secondary | ICD-10-CM | POA: Diagnosis not present

## 2020-02-08 DIAGNOSIS — D251 Intramural leiomyoma of uterus: Secondary | ICD-10-CM | POA: Insufficient documentation

## 2020-02-08 DIAGNOSIS — Z823 Family history of stroke: Secondary | ICD-10-CM | POA: Insufficient documentation

## 2020-02-08 DIAGNOSIS — I10 Essential (primary) hypertension: Secondary | ICD-10-CM | POA: Diagnosis not present

## 2020-02-08 DIAGNOSIS — Z801 Family history of malignant neoplasm of trachea, bronchus and lung: Secondary | ICD-10-CM | POA: Diagnosis not present

## 2020-02-08 DIAGNOSIS — N800 Endometriosis of uterus: Secondary | ICD-10-CM | POA: Insufficient documentation

## 2020-02-08 DIAGNOSIS — Z6836 Body mass index (BMI) 36.0-36.9, adult: Secondary | ICD-10-CM | POA: Diagnosis not present

## 2020-02-08 DIAGNOSIS — E119 Type 2 diabetes mellitus without complications: Secondary | ICD-10-CM | POA: Diagnosis not present

## 2020-02-08 DIAGNOSIS — Z7984 Long term (current) use of oral hypoglycemic drugs: Secondary | ICD-10-CM | POA: Insufficient documentation

## 2020-02-08 DIAGNOSIS — N939 Abnormal uterine and vaginal bleeding, unspecified: Secondary | ICD-10-CM

## 2020-02-08 DIAGNOSIS — Z79899 Other long term (current) drug therapy: Secondary | ICD-10-CM | POA: Diagnosis not present

## 2020-02-08 DIAGNOSIS — Z888 Allergy status to other drugs, medicaments and biological substances status: Secondary | ICD-10-CM | POA: Diagnosis not present

## 2020-02-08 DIAGNOSIS — K279 Peptic ulcer, site unspecified, unspecified as acute or chronic, without hemorrhage or perforation: Secondary | ICD-10-CM | POA: Diagnosis not present

## 2020-02-08 DIAGNOSIS — Z803 Family history of malignant neoplasm of breast: Secondary | ICD-10-CM | POA: Diagnosis not present

## 2020-02-08 DIAGNOSIS — Z8249 Family history of ischemic heart disease and other diseases of the circulatory system: Secondary | ICD-10-CM | POA: Diagnosis not present

## 2020-02-08 DIAGNOSIS — N921 Excessive and frequent menstruation with irregular cycle: Secondary | ICD-10-CM | POA: Insufficient documentation

## 2020-02-08 DIAGNOSIS — Z8349 Family history of other endocrine, nutritional and metabolic diseases: Secondary | ICD-10-CM | POA: Insufficient documentation

## 2020-02-08 HISTORY — PX: INTRAUTERINE DEVICE (IUD) INSERTION: SHX5877

## 2020-02-08 HISTORY — DX: Excessive and frequent menstruation with regular cycle: N92.0

## 2020-02-08 HISTORY — DX: Type 2 diabetes mellitus without complications: E11.9

## 2020-02-08 HISTORY — PX: DILATATION & CURETTAGE/HYSTEROSCOPY WITH MYOSURE: SHX6511

## 2020-02-08 LAB — TYPE AND SCREEN
ABO/RH(D): O POS
Antibody Screen: NEGATIVE

## 2020-02-08 LAB — POCT PREGNANCY, URINE: Preg Test, Ur: NEGATIVE

## 2020-02-08 LAB — ABO/RH: ABO/RH(D): O POS

## 2020-02-08 LAB — GLUCOSE, CAPILLARY
Glucose-Capillary: 104 mg/dL — ABNORMAL HIGH (ref 70–99)
Glucose-Capillary: 104 mg/dL — ABNORMAL HIGH (ref 70–99)

## 2020-02-08 SURGERY — DILATATION & CURETTAGE/HYSTEROSCOPY WITH MYOSURE
Anesthesia: Monitor Anesthesia Care | Site: Vagina

## 2020-02-08 MED ORDER — POVIDONE-IODINE 10 % EX SWAB: 2.0000 "application " | Freq: Once | CUTANEOUS | Status: DC

## 2020-02-08 MED ORDER — FENTANYL CITRATE (PF) 100 MCG/2ML IJ SOLN
INTRAMUSCULAR | Status: DC | PRN
Start: 2020-02-08 — End: 2020-02-08
  Administered 2020-02-08: 25 ug via INTRAVENOUS
  Administered 2020-02-08: 50 ug via INTRAVENOUS
  Administered 2020-02-08: 25 ug via INTRAVENOUS

## 2020-02-08 MED ORDER — MIDAZOLAM HCL 2 MG/2ML IJ SOLN
INTRAMUSCULAR | Status: AC
  Filled 2020-02-08: qty 2

## 2020-02-08 MED ORDER — PROPOFOL 500 MG/50ML IV EMUL
INTRAVENOUS | Status: DC | PRN
  Administered 2020-02-08: 100 ug/kg/min via INTRAVENOUS

## 2020-02-08 MED ORDER — SODIUM CHLORIDE 0.9 % IR SOLN
Status: DC | PRN
  Administered 2020-02-08: 4000 mL

## 2020-02-08 MED ORDER — LIDOCAINE 2% (20 MG/ML) 5 ML SYRINGE
INTRAMUSCULAR | Status: AC
  Filled 2020-02-08: qty 5

## 2020-02-08 MED ORDER — HYDROMORPHONE HCL 1 MG/ML IJ SOLN: 0.2500 mg | INTRAMUSCULAR | Status: DC | PRN

## 2020-02-08 MED ORDER — LEVONORGESTREL 20 MCG/24HR IU IUD
INTRAUTERINE_SYSTEM | INTRAUTERINE | Status: AC
  Filled 2020-02-08: qty 1

## 2020-02-08 MED ORDER — DEXAMETHASONE SODIUM PHOSPHATE 10 MG/ML IJ SOLN
INTRAMUSCULAR | Status: AC
  Filled 2020-02-08: qty 1

## 2020-02-08 MED ORDER — OXYCODONE HCL 5 MG/5ML PO SOLN: 5.0000 mg | Freq: Once | ORAL | Status: DC | PRN

## 2020-02-08 MED ORDER — PROMETHAZINE HCL 25 MG/ML IJ SOLN: 6.2500 mg | INTRAMUSCULAR | Status: DC | PRN

## 2020-02-08 MED ORDER — KETOROLAC TROMETHAMINE 30 MG/ML IJ SOLN
INTRAMUSCULAR | Status: AC
  Filled 2020-02-08: qty 1

## 2020-02-08 MED ORDER — LACTATED RINGERS IV SOLN
INTRAVENOUS | Status: DC
  Administered 2020-02-08: 50 mL via INTRAVENOUS

## 2020-02-08 MED ORDER — LEVONORGESTREL 20 MCG/24HR IU IUD: INTRAUTERINE_SYSTEM | INTRAUTERINE | Status: AC

## 2020-02-08 MED ORDER — OXYCODONE HCL 5 MG PO TABS: 5.0000 mg | ORAL_TABLET | Freq: Once | ORAL | Status: DC | PRN

## 2020-02-08 MED ORDER — IBUPROFEN 800 MG PO TABS: 800.0000 mg | ORAL_TABLET | Freq: Three times a day (TID) | ORAL | 0 refills | Status: DC | PRN

## 2020-02-08 MED ORDER — ONDANSETRON HCL 4 MG/2ML IJ SOLN
INTRAMUSCULAR | Status: AC
  Filled 2020-02-08: qty 2

## 2020-02-08 MED ORDER — DEXAMETHASONE SODIUM PHOSPHATE 10 MG/ML IJ SOLN
INTRAMUSCULAR | Status: DC | PRN
  Administered 2020-02-08: 5 mg via INTRAVENOUS

## 2020-02-08 MED ORDER — MEPERIDINE HCL 25 MG/ML IJ SOLN: 6.2500 mg | INTRAMUSCULAR | Status: DC | PRN

## 2020-02-08 MED ORDER — PROPOFOL 10 MG/ML IV BOLUS
INTRAVENOUS | Status: AC
  Filled 2020-02-08: qty 20

## 2020-02-08 MED ORDER — BUPIVACAINE-EPINEPHRINE 0.5% -1:200000 IJ SOLN
INTRAMUSCULAR | Status: DC | PRN
  Administered 2020-02-08: 20 mL

## 2020-02-08 MED ORDER — MIDAZOLAM HCL 5 MG/5ML IJ SOLN
INTRAMUSCULAR | Status: DC | PRN
  Administered 2020-02-08: 2 mg via INTRAVENOUS

## 2020-02-08 MED ORDER — ONDANSETRON HCL 4 MG/2ML IJ SOLN
INTRAMUSCULAR | Status: DC | PRN
  Administered 2020-02-08: 4 mg via INTRAVENOUS

## 2020-02-08 MED ORDER — SILVER NITRATE-POT NITRATE 75-25 % EX MISC
CUTANEOUS | Status: DC | PRN
  Administered 2020-02-08: 4

## 2020-02-08 MED ORDER — FENTANYL CITRATE (PF) 100 MCG/2ML IJ SOLN
INTRAMUSCULAR | Status: AC
  Filled 2020-02-08: qty 2

## 2020-02-08 MED ORDER — LIDOCAINE 2% (20 MG/ML) 5 ML SYRINGE
INTRAMUSCULAR | Status: DC | PRN
  Administered 2020-02-08: 40 mg via INTRAVENOUS

## 2020-02-08 SURGICAL SUPPLY — 18 items
CANISTER SUCT 3000ML PPV (MISCELLANEOUS) ×1 IMPLANT
CATH ROBINSON RED A/P 16FR (CATHETERS) ×2 IMPLANT
COVER WAND RF STERILE (DRAPES) ×2 IMPLANT
DEVICE MYOSURE LITE (MISCELLANEOUS) ×1 IMPLANT
DEVICE MYOSURE REACH (MISCELLANEOUS) IMPLANT
DILATOR CANAL MILEX (MISCELLANEOUS) IMPLANT
GAUZE 4X4 16PLY RFD (DISPOSABLE) ×2 IMPLANT
GLOVE BIOGEL PI IND STRL 7.0 (GLOVE) ×2 IMPLANT
GLOVE BIOGEL PI INDICATOR 7.0 (GLOVE) ×2
GLOVE SURG SS PI 6.5 STRL IVOR (GLOVE) ×2 IMPLANT
GOWN STRL REUS W/TWL LRG LVL3 (GOWN DISPOSABLE) ×4 IMPLANT
IV NS IRRIG 3000ML ARTHROMATIC (IV SOLUTION) ×2 IMPLANT
KIT PROCEDURE FLUENT (KITS) ×2 IMPLANT
Mirena Levonogestrol releasing intrauterine system ×1 IMPLANT
PACK VAGINAL MINOR WOMEN LF (CUSTOM PROCEDURE TRAY) ×2 IMPLANT
PAD OB MATERNITY 4.3X12.25 (PERSONAL CARE ITEMS) ×2 IMPLANT
SEAL CERVICAL OMNI LOK (ABLATOR) IMPLANT
SEAL ROD LENS SCOPE MYOSURE (ABLATOR) ×1 IMPLANT

## 2020-02-08 NOTE — Interval H&P Note (Signed)
History and Physical Interval Note:  02/08/2020 12:17 PM  Susan Whitaker  has presented today for surgery, with the diagnosis of menorrhagia.  The various methods of treatment have been discussed with the patient and family. After consideration of risks, benefits and other options for treatment, the patient has consented to  Procedure(s) with comments: [place] (N/A) INTRAUTERINE DEVICE (IUD) INSERTION (N/A) - Mirena as a surgical intervention.  The patient's history has been reviewed, patient examined, no change in status, stable for surgery.  I have reviewed the patient's chart and labs.  Questions were answered to the patient's satisfaction.    Alinda Dooms, MD.

## 2020-02-08 NOTE — Anesthesia Preprocedure Evaluation (Addendum)
Anesthesia Evaluation  Patient identified by MRN, date of birth, ID band Patient awake    Reviewed: Allergy & Precautions, NPO status , Patient's Chart, lab work & pertinent test results, reviewed documented beta blocker date and time   Airway Mallampati: II  TM Distance: >3 FB Neck ROM: Full    Dental  (+) Poor Dentition, Missing, Dental Advisory Given, Partial Upper,    Pulmonary neg pulmonary ROS,    Pulmonary exam normal breath sounds clear to auscultation       Cardiovascular hypertension, Pt. on medications and Pt. on home beta blockers Normal cardiovascular exam Rhythm:Regular Rate:Normal     Neuro/Psych PSYCHIATRIC DISORDERS Anxiety negative neurological ROS     GI/Hepatic PUD, GERD  ,  Endo/Other  diabetes, Type 2  Renal/GU   negative genitourinary   Musculoskeletal negative musculoskeletal ROS (+)   Abdominal (+) + obese,   Peds  Hematology negative hematology ROS (+)   Anesthesia Other Findings   Reproductive/Obstetrics                            Anesthesia Physical  Anesthesia Plan  ASA: II  Anesthesia Plan: MAC   Post-op Pain Management:    Induction: Intravenous  PONV Risk Score and Plan: 4 or greater and Ondansetron, Midazolam, Treatment may vary due to age or medical condition, Dexamethasone and Scopolamine patch - Pre-op  Airway Management Planned: Natural Airway  Additional Equipment: None  Intra-op Plan:   Post-operative Plan:   Informed Consent: I have reviewed the patients History and Physical, chart, labs and discussed the procedure including the risks, benefits and alternatives for the proposed anesthesia with the patient or authorized representative who has indicated his/her understanding and acceptance.     Dental advisory given  Plan Discussed with: CRNA  Anesthesia Plan Comments:        Anesthesia Quick Evaluation

## 2020-02-08 NOTE — Discharge Instructions (Signed)
Gynecology post-op instructions.   [name], 1. Nothing in vagina x 2 weeks: no tampons, no douching, no sexual intercourse for two weeks or until after you are seen in the office for a post op check.  2. Expect some vaginal bleeding for next several days, call me if with excessive bleeding requiring you to change a pad every hour.  If your period is due to come on it will come on as expected.  Do not use tampons for this period.   3.  Expect some abdominal cramping, take tylenol as needed.  You may also use ibuprofen that has been prescribed if pain is not well controlled.  Use oxycodone-acetaminophen if the first two medication are not working, keeping in mind not to exceed 4000mg  of acetaminophen in a 24 hour period.  The Extra strength tylenol has 500 mg of acetaminophen while the oxycodone acetaminophen has 325 mg.   Call me if pain is intolerable despite medication use. 4. Please keep your upcoming office appointment as scheduled on 02/25/2020 for a post op check.  Sincerely, Dr. Alesia Richards.  Phone [phone].   Post Anesthesia Home Care Instructions  Activity: Get plenty of rest for the remainder of the day. A responsible individual must stay with you for 24 hours following the procedure.  For the next 24 hours, DO NOT: -Drive a car -Paediatric nurse -Drink alcoholic beverages -Take any medication unless instructed by your physician -Make any legal decisions or sign important papers.  Meals: Start with liquid foods such as gelatin or soup. Progress to regular foods as tolerated. Avoid greasy, spicy, heavy foods. If nausea and/or vomiting occur, drink only clear liquids until the nausea and/or vomiting subsides. Call your physician if vomiting continues.  Special Instructions/Symptoms: Your throat may feel dry or sore from the anesthesia or the breathing tube placed in your throat during surgery. If this causes discomfort, gargle with warm salt water. The  discomfort should disappear within 24 hours.  If you had a scopolamine patch placed behind your ear for the management of post- operative nausea and/or vomiting:  1. The medication in the patch is effective for 72 hours, after which it should be removed.  Wrap patch in a tissue and discard in the trash. Wash hands thoroughly with soap and water. 2. You may remove the patch earlier than 72 hours if you experience unpleasant side effects which may include dry mouth, dizziness or visual disturbances. 3. Avoid touching the patch. Wash your hands with soap and water after contact with the patch.

## 2020-02-08 NOTE — Op Note (Addendum)
Patient: Susan Whitaker DOB:[birth year] MRN: [id number]   Preop diagnosis:  1. Menometrorrhagia 2. Fibroid uterus 3. Adenomyosis 4. Endometrial lesion, suspect endometrial polyp.  Post operative diagnosis:  1. Menometrorrhagia 2. Fibroid uterus 3. Adenomyosis 4. Endometrial lesion, suspect uterine fibroid.  Procedures:  1. Dilation and Curettage hysteroscopy with Myosure.  2. Mirena IUD insertion under Ultrasound guidance.  Surgeon: Dr. Waymon Amato  Assistant: None   Scrub technician:  Mel Almond  Anesthesia: Monitored Anesthesia care  Complications: None  Fluid deficit: 350 cc  Urine:100 cc straight catheterization  EBL: 20 cc  Indications:  49 y/o P2 with a history of menometrorrhagia, fibroids (intramural), adenomyosis and an endometrial lesion found on ultrasound, suspect uterine polyp, here for dilation and currettage hysteroscopy with possible myosure. She was taken to the operating room for removal of endometrial lesion and placement of Mirena IUD.       Procedure:  Informed consent was obtained from the patient to undergo the procedures including risks of bleeding, infection and damage to organs.  She was taken to the operating room and anesthesia was administered without difficulty. An exam was then performed under anesthesia revealing a 14 week sized anteverted uterus and a closed cervix. There were no palpable adnexal masses.  She was prepped and draped in the usual sterile fashion. She was straight catheterized.  A graves speculum was used to view the cervix. Single-tooth tenaculum was placed on the cervix. 20 cc 0.25% marcaine with epinephrine used for a paracervical block.  The cervix was dilated to #17 pratt dilators.  Uterus sounded to 10 cm.  The hysteroscope was advanced through the internal cervical os. The uterine cavity and uterus was noted to have a  2cm endometrial lesion that looked like a fibroid on the posterior uterine wall.  An attempt was made to remove the  lesion with hysteroscopic scissors and graspers and sharp curettage without success. The myosure scope and myosure LITE were then placed in and the lesion was shaved off under direct visualization without any complications. Sharp curettage was then performed again. The tubal ostia were not noted during the procedure likely due to the intramural fibroids infringing on the cavity.  No other endometrial lesions were noted.  All instruments were then removed and Mirena IUD insertion was performed under ultrasound guidance.  Correct placement of IUD was confirmed on abdominal and transvaginal ultrasound.  The patient was then awoken from anesthesia and taken to recovery room in stable condition  Specimen:1.  Endometrial lesion suspect fibroid 2. Endometrial currettings.    Disposition: Stable to PACU.  Dr. Alesia Richards. 02/08/2020.  1416.

## 2020-02-08 NOTE — Transfer of Care (Signed)
Immediate Anesthesia Transfer of Care Note  Patient: [name]  Procedure(s) Performed: DILATATION & CURETTAGE/HYSTEROSCOPY WITH MYOSURE (N/A Vagina ) INTRAUTERINE DEVICE (IUD) INSERTION UNDER ULTRASOUND GUIDANCE (N/A Vagina )  Patient Location: PACU  Anesthesia Type:MAC  Level of Consciousness: awake, alert , oriented and patient cooperative  Airway & Oxygen Therapy: Patient Spontanous Breathing and Patient connected to face mask oxygen  Post-op Assessment: Report given to RN, Post -op Vital signs reviewed and stable and Patient moving all extremities  Post vital signs: Reviewed and stable  Last Vitals:  Vitals Value Taken Time  BP 109/71 02/08/20 1337  Temp    Pulse 81 02/08/20 1339  Resp 14 02/08/20 1339  SpO2 96 % 02/08/20 1339  Vitals shown include unvalidated device data.  Last Pain:  Vitals:   02/08/20 1024  TempSrc: Oral  PainSc: 0-No pain      Patients Stated Pain Goal: 3 (28/40/69 8614)  Complications: No complications documented.

## 2020-02-10 NOTE — Anesthesia Postprocedure Evaluation (Addendum)
Anesthesia Post Note  Patient: Susan Whitaker  Procedure(s) Performed: DILATATION & CURETTAGE/HYSTEROSCOPY WITH MYOSURE (N/A Vagina ) INTRAUTERINE DEVICE (IUD) INSERTION UNDER ULTRASOUND GUIDANCE (N/A Vagina )     Patient location during evaluation: PACU Anesthesia Type: MAC Level of consciousness: sedated and patient cooperative Pain management: pain level controlled Vital Signs Assessment: post-procedure vital signs reviewed and stable Respiratory status: spontaneous breathing Cardiovascular status: stable Anesthetic complications: no   No complications documented.  Last Vitals:  Vitals:   02/08/20 1415 02/08/20 1507  BP: 133/85 122/69  Pulse: 76 73  Resp: 18 16  Temp:  36.6 C  SpO2: 99% 97%    Last Pain:  Vitals:   02/08/20 1507  TempSrc:   PainSc: 0-No pain                 Nolon Nations

## 2020-02-11 ENCOUNTER — Encounter (HOSPITAL_BASED_OUTPATIENT_CLINIC_OR_DEPARTMENT_OTHER): Payer: Self-pay | Admitting: Obstetrics & Gynecology

## 2020-02-11 LAB — SURGICAL PATHOLOGY

## 2020-02-13 ENCOUNTER — Encounter (HOSPITAL_BASED_OUTPATIENT_CLINIC_OR_DEPARTMENT_OTHER): Payer: Self-pay | Admitting: Obstetrics & Gynecology

## 2020-02-13 NOTE — Addendum Note (Signed)
Addendum  created 02/13/20 0813 by Nolon Nations, MD   Clinical Note Signed, SmartForm saved

## 2020-03-10 ENCOUNTER — Other Ambulatory Visit: Payer: Self-pay | Admitting: Family

## 2020-03-10 ENCOUNTER — Other Ambulatory Visit: Payer: Self-pay | Admitting: Endocrinology

## 2020-03-21 ENCOUNTER — Other Ambulatory Visit: Payer: Self-pay | Admitting: Family

## 2020-03-21 DIAGNOSIS — Z1231 Encounter for screening mammogram for malignant neoplasm of breast: Secondary | ICD-10-CM

## 2020-03-27 LAB — HM MAMMOGRAPHY: HM Mammogram: ABNORMAL — AB (ref 0–4)

## 2020-03-31 LAB — HM DIABETES EYE EXAM

## 2020-04-01 ENCOUNTER — Encounter: Payer: Self-pay | Admitting: *Deleted

## 2020-04-08 ENCOUNTER — Telehealth: Payer: Self-pay | Admitting: Family

## 2020-04-08 NOTE — Telephone Encounter (Signed)
Please let pt know that the radiologist would like her to complete some additional breast images for further evaluation. Let me know if she has not been contacted by them about a follow up appointment in 1 week.   

## 2020-04-08 NOTE — Telephone Encounter (Signed)
CMA reviewed with pt and gave her number to call [facility].

## 2020-04-13 ENCOUNTER — Telehealth: Payer: Self-pay | Admitting: Family

## 2020-04-13 NOTE — Telephone Encounter (Signed)
Called patient to reschedule her 7 AM physical for Monday, November 29. She was happy to reschedule her physical with Lenna Sciara being out of the office but expressed some concerns around wanting a diagnostic mammogram. Recently had a screening mammogram due to concerns with discharge from her left breast. Technician notated on the mammogram she saw something suspicious on the right side. She received a call several hours later saying that after comparison there was nothing for her to worry about. Patient's mother and two aunts have breast cancer and she is now concerned and would like a diagnostic mammogram at [facility] imaging. She is currently scheduled for December 15 but would like to see if that screening mammogram can be changed to a diagnostic.

## 2020-04-14 ENCOUNTER — Telehealth: Payer: Self-pay | Admitting: Family

## 2020-04-14 ENCOUNTER — Encounter: Payer: 59 | Admitting: Family

## 2020-04-14 NOTE — Telephone Encounter (Signed)
See mychart.  

## 2020-04-14 NOTE — Telephone Encounter (Signed)
Susan Whitaker, can we please request a copy of her report?  Also, let's bring her in for a visit when I am back in the office (12/13).  I can do a breast exam and if appropriate- I can order the diagnostic mammogram.

## 2020-04-14 NOTE — Telephone Encounter (Signed)
Copy of report will be sent to provider today, patient is scheduled to come in 04-28-2020.

## 2020-04-28 ENCOUNTER — Encounter: Payer: Self-pay | Admitting: Family

## 2020-04-28 ENCOUNTER — Ambulatory Visit (INDEPENDENT_AMBULATORY_CARE_PROVIDER_SITE_OTHER): Payer: 59 | Admitting: Family

## 2020-04-28 ENCOUNTER — Other Ambulatory Visit: Payer: Self-pay

## 2020-04-28 VITALS — BP 140/73 | HR 90 | Temp 98.5°F | Resp 16 | Ht 63.0 in | Wt 212.0 lb

## 2020-04-28 DIAGNOSIS — E559 Vitamin D deficiency, unspecified: Secondary | ICD-10-CM | POA: Diagnosis not present

## 2020-04-28 DIAGNOSIS — I10 Essential (primary) hypertension: Secondary | ICD-10-CM | POA: Diagnosis not present

## 2020-04-28 DIAGNOSIS — Z23 Encounter for immunization: Secondary | ICD-10-CM | POA: Diagnosis not present

## 2020-04-28 DIAGNOSIS — I471 Supraventricular tachycardia, unspecified: Secondary | ICD-10-CM

## 2020-04-28 DIAGNOSIS — E785 Hyperlipidemia, unspecified: Secondary | ICD-10-CM | POA: Diagnosis not present

## 2020-04-28 DIAGNOSIS — Z1159 Encounter for screening for other viral diseases: Secondary | ICD-10-CM

## 2020-04-28 DIAGNOSIS — E119 Type 2 diabetes mellitus without complications: Secondary | ICD-10-CM

## 2020-04-28 DIAGNOSIS — Z0000 Encounter for general adult medical examination without abnormal findings: Secondary | ICD-10-CM

## 2020-04-28 DIAGNOSIS — R928 Other abnormal and inconclusive findings on diagnostic imaging of breast: Secondary | ICD-10-CM

## 2020-04-28 LAB — LIPID PANEL
Cholesterol: 175 mg/dL (ref 0–200)
HDL: 39.3 mg/dL (ref >39.00)
LDL Cholesterol: 114 mg/dL — ABNORMAL HIGH (ref 0–99)
NonHDL: 135.41
Total CHOL/HDL Ratio: 4
Triglycerides: 106 mg/dL (ref 0.0–149.0)
VLDL: 21.2 mg/dL (ref 0.0–40.0)

## 2020-04-28 LAB — COMPREHENSIVE METABOLIC PANEL
ALT: 16 U/L (ref 0–35)
AST: 12 U/L (ref 0–37)
Albumin: 4.1 g/dL (ref 3.5–5.2)
Alkaline Phosphatase: 50 U/L (ref 39–117)
BUN: 20 mg/dL (ref 6–23)
CO2: 29 mEq/L (ref 19–32)
Calcium: 9.1 mg/dL (ref 8.4–10.5)
Chloride: 102 mEq/L (ref 96–112)
Creatinine, Ser: 0.94 mg/dL (ref 0.40–1.20)
GFR: 70.44 mL/min (ref >60.00)
Glucose, Bld: 111 mg/dL — ABNORMAL HIGH (ref 70–99)
Potassium: 4 mEq/L (ref 3.5–5.1)
Sodium: 138 mEq/L (ref 135–145)
Total Bilirubin: 0.2 mg/dL (ref 0.2–1.2)
Total Protein: 7.5 g/dL (ref 6.0–8.3)

## 2020-04-28 NOTE — Patient Instructions (Addendum)
Please schedule a follow up appointment with Dr. Posey Pronto this week.  Continue to work on [employer], weight loss and exercise. Complete lab work prior to leaving.   Preventive Care 13-51 Years Old, Female Preventive care refers to visits with your health care provider and lifestyle choices that can promote health and wellness. This includes:  A yearly physical exam. This may also be called an annual well check.  Regular dental visits and eye exams.  Immunizations.  Screening for certain conditions.  Healthy lifestyle choices, such as eating a healthy diet, getting regular exercise, not using drugs or products that contain nicotine and tobacco, and limiting alcohol use. What can I expect for my preventive care visit? Physical exam Your health care provider will check your:  Height and weight. This may be used to calculate body mass index (BMI), which tells if you are at a healthy weight.  Heart rate and blood pressure.  Skin for abnormal spots. Counseling Your health care provider may ask you questions about your:  Alcohol, tobacco, and drug use.  Emotional well-being.  Home and relationship well-being.  Sexual activity.  Eating habits.  Work and work Statistician.  Method of birth control.  Menstrual cycle.  Pregnancy history. What immunizations do I need?  Influenza (flu) vaccine  This is recommended every year. Tetanus, diphtheria, and pertussis (Tdap) vaccine  You may need a Td booster every 10 years. Varicella (chickenpox) vaccine  You may need this if you have not been vaccinated. Zoster (shingles) vaccine  You may need this after age 80. Measles, mumps, and rubella (MMR) vaccine  You may need at least one dose of MMR if you were born in 1957 or later. You may also need a second dose. Pneumococcal conjugate (PCV13) vaccine  You may need this if you have certain conditions and were not previously vaccinated. Pneumococcal polysaccharide (PPSV23)  vaccine  You may need one or two doses if you smoke cigarettes or if you have certain conditions. Meningococcal conjugate (MenACWY) vaccine  You may need this if you have certain conditions. Hepatitis A vaccine  You may need this if you have certain conditions or if you travel or work in places where you may be exposed to hepatitis A. Hepatitis B vaccine  You may need this if you have certain conditions or if you travel or work in places where you may be exposed to hepatitis B. Haemophilus influenzae type b (Hib) vaccine  You may need this if you have certain conditions. Human papillomavirus (HPV) vaccine  If recommended by your health care provider, you may need three doses over 6 months. You may receive vaccines as individual doses or as more than one vaccine together in one shot (combination vaccines). Talk with your health care provider about the risks and benefits of combination vaccines. What tests do I need? Blood tests  Lipid and cholesterol levels. These may be checked every 5 years, or more frequently if you are over 64 years old.  Hepatitis C test.  Hepatitis B test. Screening  Lung cancer screening. You may have this screening every year starting at age 62 if you have a 30-pack-year history of smoking and currently smoke or have quit within the past 15 years.  Colorectal cancer screening. All adults should have this screening starting at age 22 and continuing until age 19. Your health care provider may recommend screening at age 34 if you are at increased risk. You will have tests every 1-10 years, depending on your results and  the type of screening test.  Diabetes screening. This is done by checking your blood sugar (glucose) after you have not eaten for a while (fasting). You may have this done every 1-3 years.  Mammogram. This may be done every 1-2 years. Talk with your health care provider about when you should start having regular mammograms. This may depend on  whether you have a family history of breast cancer.  BRCA-related cancer screening. This may be done if you have a family history of breast, ovarian, tubal, or peritoneal cancers.  Pelvic exam and Pap test. This may be done every 3 years starting at age 27. Starting at age 3, this may be done every 5 years if you have a Pap test in combination with an HPV test. Other tests  Sexually transmitted disease (STD) testing.  Bone density scan. This is done to screen for osteoporosis. You may have this scan if you are at high risk for osteoporosis. Follow these instructions at home: Eating and drinking  Eat a diet that includes fresh fruits and vegetables, whole grains, lean protein, and low-fat dairy.  Take vitamin and mineral supplements as recommended by your health care provider.  Do not drink alcohol if: ? Your health care provider tells you not to drink. ? You are pregnant, may be pregnant, or are planning to become pregnant.  If you drink alcohol: ? Limit how much you have to 0-1 drink a day. ? Be aware of how much alcohol is in your drink. In the [place] one drink equals one 12 oz bottle of beer (355 mL), one 5 oz glass of wine (148 mL), or one 1 oz glass of hard liquor (44 mL). Lifestyle  Take daily care of your teeth and gums.  Stay active. Exercise for at least 30 minutes on 5 or more days each week.  Do not use any products that contain nicotine or tobacco, such as cigarettes, e-cigarettes, and chewing tobacco. If you need help quitting, ask your health care provider.  If you are sexually active, practice safe sex. Use a condom or other form of birth control (contraception) in order to prevent pregnancy and STIs (sexually transmitted infections).  If told by your health care provider, take low-dose aspirin daily starting at age 67. What's next?  Visit your health care provider once a year for a well check visit.  Ask your health care provider how often you should have your  eyes and teeth checked.  Stay up to date on all vaccines. This information is not intended to replace advice given to you by your health care provider. Make sure you discuss any questions you have with your health care provider. Document Revised: 01/12/2018 Document Reviewed: 01/12/2018 Elsevier Patient Education  2020 [employer].

## 2020-04-28 NOTE — Progress Notes (Signed)
Subjective:    Patient ID: Susan Whitaker, female    DOB: 08-02-1970, 49 y.o.   MRN: [id number]  HPI   Patient is a 49 yr old female who presents today for her annual physical.  Immunizations: pfizer x 2- due for booster 12/18, tdap 2014, shingrix #2 due Diet:  fair Wt Readings from Last 3 Encounters:  04/28/20 212 lb (96.2 kg)  02/08/20 208 lb (94.3 kg)  02/05/20 208 lb (94.3 kg)  Exercise: not walking regularly, walks dog some Colonoscopy:  05/03/16 Pap Smear: 03/24/16- will complete pap in January Mammogram: up to date- see discussion below Vision: 03/31/20 Dental:    DM2- continues to follow with endo.  Lab Results  Component Value Date   HGBA1C 6.0 01/24/2020   HGBA1C 5.7 08/02/2019   HGBA1C 5.6 06/02/2019   Lab Results  Component Value Date   MICROALBUR 1.0 04/05/2019   LDLCALC 129 (H) 10/10/2019   CREATININE 0.91 02/05/2020   Hx of SVT- denies recent palpitations.    Hyperlipidemia- maintained on lipitor 20mg .   Lab Results  Component Value Date   CHOL 193 10/10/2019   HDL 41.80 10/10/2019   LDLCALC 129 (H) 10/10/2019   LDLDIRECT 103.1 03/04/2014   TRIG 110.0 10/10/2019   CHOLHDL 5 10/10/2019   HTN-   BP Readings from Last 3 Encounters:  04/28/20 140/73  02/08/20 122/69  02/05/20 (!) 169/92   Vit D deficiency- vit D was normal back in May.     Review of Systems  Constitutional: Negative for unexpected weight change.  HENT: Negative for hearing loss and rhinorrhea.   Eyes: Negative for visual disturbance.  Respiratory: Negative for cough and shortness of breath.   Cardiovascular: Negative for chest pain.  Gastrointestinal: Negative for blood in stool, constipation and diarrhea.  Genitourinary: Negative for dysuria, frequency and hematuria.  Musculoskeletal: Negative for arthralgias and myalgias.  Skin: Negative for rash.  Neurological: Negative for headaches.  Hematological: Negative for adenopathy.  Psychiatric/Behavioral:       Denies  depression/anxiety    Past Medical History:  Diagnosis Date  . Allergy    allergic rhinitis  . DM type 2 (diabetes mellitus, type 2) ([place])   . FHx: hypertension   . Gastric ulcer   . GERD (gastroesophageal reflux disease)   . H/O constipation   . H/O hemorrhoids   . H/O varicella   . History of small bowel obstruction 06/02/2019  . Hyperlipidemia   . Hypertension   . Hypokalemia 2019  . Increased BMI 06/2010  . Menorrhagia   . Primary hyperaldosteronism ([place]) 02/21/2012  . SVT (supraventricular tachycardia) ([place])    Noted 11/2011 admission  . Thrombocytopenia ([place])   . Thyroid fullness 08/2005   monitoring no meds for followed by pcp     Social History   Socioeconomic History  . Marital status: Single    Spouse name: Not on file  . Number of children: 2  . Years of education: Not on file  . Highest education level: Not on file  Occupational History  . Occupation: YOUTH EMPLOYMENT    Employer: [employer]  Tobacco Use  . Smoking status: Never Smoker  . Smokeless tobacco: Never Used  Vaping Use  . Vaping Use: Never used  Substance and Sexual Activity  . Alcohol use: No  . Drug use: No  . Sexual activity: Not on file  Other Topics Concern  . Not on file  Social History Narrative   Works at [employer]  Social Determinants of Health   Financial Resource Strain: Not on file  Food Insecurity: Not on file  Transportation Needs: Not on file  Physical Activity: Not on file  Stress: Not on file  Social Connections: Not on file  Intimate Partner Violence: Not on file    Past Surgical History:  Procedure Laterality Date  . ABDOMINAL SURGERY     41 months of age-- unsure of type of surgery  . COLONOSCOPY  04/2016   hemorrhoids--normal per pt with Dr Collene Mares  . DILATATION & CURETTAGE/HYSTEROSCOPY WITH MYOSURE N/A 02/08/2020   Procedure: DILATATION & CURETTAGE/HYSTEROSCOPY WITH MYOSURE;  Surgeon: Waymon Amato, MD;  Location: [facility];   Service: Gynecology;  Laterality: N/A;  . HEMORRHOID SURGERY  2005/2006  . INTRAUTERINE DEVICE (IUD) INSERTION N/A 02/08/2020   Procedure: INTRAUTERINE DEVICE (IUD) INSERTION UNDER ULTRASOUND GUIDANCE;  Surgeon: Waymon Amato, MD;  Location: [facility];  Service: Gynecology;  Laterality: N/A;  Mirena  . LAPAROTOMY N/A 06/03/2019   Procedure: EXPLORATORY LAPAROTOMY WITH LYSIS OF ADHESIONS;  Surgeon: Jovita Kussmaul, MD;  Location: [facility];  Service: General;  Laterality: N/A;  . UTERINE FIBROID EMBOLIZATION  2010   at [facility]    Family History  Problem Relation Age of Onset  . Cancer Mother        breast  . Stroke Mother   . Cancer Maternal Grandmother        breast  . Hypertension Maternal Grandmother   . Cancer Other        breast, lung  . Hypertension Other   . Stroke Other   . Vision loss Father        some  . Heart disease Father        Rhythm disturbance  . Hypertension Sister   . Hypertension Brother   . Hypertension Brother   . Diabetes Neg Hx     Allergies  Allergen Reactions  . Lisinopril Cough    Current Outpatient Medications on File Prior to Visit  Medication Sig Dispense Refill  . acetaminophen (TYLENOL) 500 MG tablet Take 2 tablets (1,000 mg total) by mouth every 6 (six) hours as needed for mild pain or headache.    Marland Kitchen amLODipine (NORVASC) 10 MG tablet Take 1 tablet by mouth once daily 90 tablet 0  . aspirin EC 81 MG tablet Take 1 tablet (81 mg total) by mouth daily.    Marland Kitchen atorvastatin (LIPITOR) 20 MG tablet Take 1 tablet by mouth once daily 90 tablet 0  . docusate sodium (COLACE) 100 MG capsule Take 1 capsule (100 mg total) by mouth daily as needed for mild constipation.    . Dulaglutide (TRULICITY) 3 DQ/2.2[facility] SOPN Inject 3 mg into the skin once a week. 2 mL 2  . glucose blood (ACCU-CHEK GUIDE) test strip 1 each by Other route 3 (three) times daily. Use as instructed to check once daily. 100 each 12  . ibuprofen (ADVIL) 800 MG tablet Take 1 tablet  (800 mg total) by mouth every 8 (eight) hours as needed for moderate pain or cramping. 30 tablet 0  . INVOKANA 300 MG TABS tablet TAKE 1 TABLET BY MOUTH ONCE DAILY BEFORE BREAKFAST 30 tablet 0  . losartan (COZAAR) 100 MG tablet Take 1 tablet by mouth once daily 90 tablet 0  . metoprolol tartrate (LOPRESSOR) 100 MG tablet Take 100 mg by mouth daily.    Glory Rosebush DELICA LANCETS FINE MISC Use to check blood sugar 3 times daily 100  each 3  . Polyethyl Glycol-Propyl Glycol (SYSTANE PRESERVATIVE FREE OP) Place 2 drops into both eyes 4 (four) times daily as needed (irritation, dryness).     . polyethylene glycol (MIRALAX / GLYCOLAX) 17 g packet Take 17 g by mouth daily as needed for mild constipation.    Marland Kitchen spironolactone (ALDACTONE) 100 MG tablet Take 1 tablet by mouth once daily 90 tablet 0   No current facility-administered medications on file prior to visit.    BP 140/73 (BP Location: Right Arm, Patient Position: Sitting, Cuff Size: Large)   Pulse 90   Temp 98.5 F (36.9 C) (Oral)   Resp 16   Ht 5\' 3"  (1.6 m)   Wt 212 lb (96.2 kg)   SpO2 100%   BMI 37.55 kg/m       Objective:   Physical Exam  Physical Exam  Constitutional: She is oriented to person, place, and time. She appears well-developed and well-nourished. No distress.  HENT:  Head: Normocephalic and atraumatic.  Right Ear: Tympanic membrane and ear canal normal.  Left Ear: Tympanic membrane and ear canal normal.  Mouth/Throat: Not examined- pt wearing mask Eyes: Pupils are equal, round, and reactive to light. No scleral icterus.  Neck: Normal range of motion. No thyromegaly present.  Cardiovascular: Normal rate and regular rhythm.   No murmur heard. Pulmonary/Chest: Effort normal and breath sounds normal. No respiratory distress. He has no wheezes. She has no rales. She exhibits no tenderness.  Abdominal: Soft. Bowel sounds are normal. She exhibits no distension and no mass. There is no tenderness. There is no rebound and  no guarding.  Musculoskeletal: She exhibits no edema.  Lymphadenopathy:    She has no cervical adenopathy.  Neurological: She is alert and oriented to person, place, and time. She has normal patellar reflexes. She exhibits normal muscle tone. Coordination normal.  Skin: Skin is warm and dry.  Psychiatric: She has a normal mood and affect. Her behavior is normal. Judgment and thought content normal.  Breast/pelvic: deferred to GYN       Assessment & Plan:   Preventative care- discussed diet/exercise and weight loss. Colo up to date.  Shingrix #2 today and flu shot today. Obtain labs as ordered. We had a long discussion about her mammogram. She had a mammogram performed at [facility] (was previously followed at [facility]).  Indeterminant mass noted in right breast. She was contacted by [facility] and told that she needed a diagnostic mammogram.  [facility] then obtained a copy of her mammogram imaging from [facility] and determined that the lesion appeared benign and that a diagnostic mammogram was no longer indicated.  She was very upset about this and does not feel comfortable with the assessment and is requesting a bilateral diagnostic mammogram to be performed at [facility] imaging.  I advised the patient that I am happy to place these orders along with bilateral US, but that these tests will unlikely be covered by her insurance.  Pt verbalizes understanding.  HTN- fair bp today.  Continue losartan 100mg , lopressor 100mg , aldactone 100mg .  Hx SVT- planning ablation after the new year. Clinically stable.    DM2- A1C at goal management per endo.    Hyperlipidemia- tolerating statin, obtain follow up lipid panel.  Vit D deficiency- last vit D level was normal without supplement.  This visit occurred during the SARS-CoV-2 public health emergency.  Safety protocols were in place, including screening questions prior to the visit, additional usage of staff PPE, and extensive cleaning of  exam room while observing  appropriate contact time as indicated for disinfecting solutions.        Assessment & Plan:

## 2020-04-29 ENCOUNTER — Telehealth: Payer: Self-pay | Admitting: Family

## 2020-04-29 LAB — HEPATITIS C ANTIBODY
Hepatitis C Ab: NONREACTIVE
SIGNAL TO CUT-OFF: 0.01 (ref <1.00)

## 2020-04-29 MED ORDER — ATORVASTATIN CALCIUM 40 MG PO TABS: 40.0000 mg | ORAL_TABLET | Freq: Every day | ORAL | 1 refills | Status: DC

## 2020-04-29 NOTE — Telephone Encounter (Signed)
Please advise pt:  Hep C testing is negative. Cholesterol is above goal. I would like for her to increase her atorvastatin to 40mg  once daily.  Please follow up with me in 3 months.

## 2020-04-30 ENCOUNTER — Ambulatory Visit: Payer: 59

## 2020-04-30 ENCOUNTER — Telehealth: Payer: Self-pay | Admitting: Family

## 2020-04-30 NOTE — Telephone Encounter (Signed)
When you speak with her you can also let her know that I spoke with her GYN and she is going to reaching out to her to reschedule the mammogram at her office.

## 2020-04-30 NOTE — Telephone Encounter (Signed)
I spoke to Dr. Reymundo Poll (pt's GYN). Per Dr. Reymundo Poll, pt presented to the breast center and was told that they could not do her diagnostic mammo/US today because they first needed to get a copy of her mammogram from [place]. I explained pt's concern about the original findings of her 1st mammo at [facility].  Pt became very upset and called GYN and requested that Mammogram/US be performed at her office. Dr. Reymundo Poll plans to order the US/diagnostic mammo at her office and notify pt that this may not be covered by and insurance.

## 2020-04-30 NOTE — Telephone Encounter (Signed)
Patient advised of results and that she will be getting a call from GYN

## 2020-04-30 NOTE — Telephone Encounter (Signed)
Caller : Dr Reymundo Poll  Call Back @ [phone]  Dr.Kluwa called in reference to patient and would like a return call, per Dr Reymundo Poll if you call back and get her voice mail please leave a good time and number to call you back.

## 2020-05-08 ENCOUNTER — Other Ambulatory Visit: Payer: Self-pay | Admitting: Endocrinology

## 2020-06-09 ENCOUNTER — Other Ambulatory Visit: Payer: Self-pay | Admitting: Endocrinology

## 2020-06-10 ENCOUNTER — Other Ambulatory Visit: Payer: Self-pay

## 2020-06-10 ENCOUNTER — Telehealth: Payer: Self-pay | Admitting: Internal Medicine

## 2020-06-10 ENCOUNTER — Encounter: Payer: Self-pay | Admitting: *Deleted

## 2020-06-10 ENCOUNTER — Emergency Department (HOSPITAL_BASED_OUTPATIENT_CLINIC_OR_DEPARTMENT_OTHER)
Admission: EM | Admit: 2020-06-10 | Discharge: 2020-06-10 | Disposition: A | Discharge status: Home / Self Care | Payer: 59 | Attending: Emergency Medicine | Admitting: Emergency Medicine

## 2020-06-10 ENCOUNTER — Emergency Department (HOSPITAL_BASED_OUTPATIENT_CLINIC_OR_DEPARTMENT_OTHER): Payer: 59

## 2020-06-10 ENCOUNTER — Encounter (HOSPITAL_BASED_OUTPATIENT_CLINIC_OR_DEPARTMENT_OTHER): Payer: Self-pay | Admitting: Emergency Medicine

## 2020-06-10 ENCOUNTER — Other Ambulatory Visit: Payer: Self-pay | Admitting: Family

## 2020-06-10 DIAGNOSIS — I200 Unstable angina: Secondary | ICD-10-CM

## 2020-06-10 DIAGNOSIS — Z7982 Long term (current) use of aspirin: Secondary | ICD-10-CM | POA: Diagnosis not present

## 2020-06-10 DIAGNOSIS — U071 COVID-19: Secondary | ICD-10-CM | POA: Diagnosis not present

## 2020-06-10 DIAGNOSIS — R61 Generalized hyperhidrosis: Secondary | ICD-10-CM | POA: Insufficient documentation

## 2020-06-10 DIAGNOSIS — Z794 Long term (current) use of insulin: Secondary | ICD-10-CM | POA: Insufficient documentation

## 2020-06-10 DIAGNOSIS — Z7984 Long term (current) use of oral hypoglycemic drugs: Secondary | ICD-10-CM | POA: Insufficient documentation

## 2020-06-10 DIAGNOSIS — R110 Nausea: Secondary | ICD-10-CM | POA: Diagnosis not present

## 2020-06-10 DIAGNOSIS — I10 Essential (primary) hypertension: Secondary | ICD-10-CM | POA: Insufficient documentation

## 2020-06-10 DIAGNOSIS — E119 Type 2 diabetes mellitus without complications: Secondary | ICD-10-CM | POA: Diagnosis not present

## 2020-06-10 DIAGNOSIS — R002 Palpitations: Secondary | ICD-10-CM | POA: Insufficient documentation

## 2020-06-10 DIAGNOSIS — Z79899 Other long term (current) drug therapy: Secondary | ICD-10-CM | POA: Insufficient documentation

## 2020-06-10 DIAGNOSIS — I499 Cardiac arrhythmia, unspecified: Secondary | ICD-10-CM

## 2020-06-10 LAB — CBC
HCT: 42.7 % (ref 36.0–46.0)
Hemoglobin: 14 g/dL (ref 12.0–15.0)
MCH: 29.2 pg (ref 26.0–34.0)
MCHC: 32.8 g/dL (ref 30.0–36.0)
MCV: 89.1 fL (ref 80.0–100.0)
Platelets: 488 10*3/uL — ABNORMAL HIGH (ref 150–400)
RBC: 4.79 MIL/uL (ref 3.87–5.11)
RDW: 13.2 % (ref 11.5–15.5)
WBC: 5.7 10*3/uL (ref 4.0–10.5)
nRBC: 0 % (ref 0.0–0.2)

## 2020-06-10 LAB — BASIC METABOLIC PANEL
Anion gap: 12 (ref 5–15)
BUN: 22 mg/dL — ABNORMAL HIGH (ref 6–20)
CO2: 24 mmol/L (ref 22–32)
Calcium: 9.2 mg/dL (ref 8.9–10.3)
Chloride: 102 mmol/L (ref 98–111)
Creatinine, Ser: 1.12 mg/dL — ABNORMAL HIGH (ref 0.44–1.00)
GFR, Estimated: 60 mL/min — ABNORMAL LOW (ref >60)
Glucose, Bld: 134 mg/dL — ABNORMAL HIGH (ref 70–99)
Potassium: 3.8 mmol/L (ref 3.5–5.1)
Sodium: 138 mmol/L (ref 135–145)

## 2020-06-10 LAB — TROPONIN I (HIGH SENSITIVITY)
Troponin I (High Sensitivity): 10 ng/L (ref <18)
Troponin I (High Sensitivity): 37 ng/L — ABNORMAL HIGH (ref <18)
Troponin I (High Sensitivity): 77 ng/L — ABNORMAL HIGH (ref <18)

## 2020-06-10 LAB — D-DIMER, QUANTITATIVE: D-Dimer, Quant: 0.52 ug/mL-FEU — ABNORMAL HIGH (ref 0.00–0.50)

## 2020-06-10 LAB — SARS CORONAVIRUS 2 BY RT PCR (HOSPITAL ORDER, PERFORMED IN ~~LOC~~ HOSPITAL LAB): SARS Coronavirus 2: POSITIVE — AB

## 2020-06-10 LAB — PREGNANCY, URINE: Preg Test, Ur: NEGATIVE

## 2020-06-10 LAB — TSH: TSH: 1.691 u[IU]/mL (ref 0.350–4.500)

## 2020-06-10 LAB — MAGNESIUM: Magnesium: 2.1 mg/dL (ref 1.7–2.4)

## 2020-06-10 MED ORDER — SODIUM CHLORIDE 0.9 % IV BOLUS
500.0000 mL | Freq: Once | INTRAVENOUS | Status: AC
  Administered 2020-06-10: 500 mL via INTRAVENOUS

## 2020-06-10 MED ORDER — IOHEXOL 350 MG/ML SOLN
100.0000 mL | Freq: Once | INTRAVENOUS | Status: AC | PRN
  Administered 2020-06-10: 100 mL via INTRAVENOUS

## 2020-06-10 NOTE — Telephone Encounter (Signed)
Per Dr. Johney Frame, called the patient to arrange office visit tomorrow for evaluation.  Left a message for the patient to call in the morning to confirm appointment. Scheduled visit with Dr. Harrington Challenger tomorrow.  Will route to Triage for follow-up in the AM.

## 2020-06-10 NOTE — Telephone Encounter (Signed)
[facility] calling back in to request to speak with DOD. Transferred call to DOD

## 2020-06-10 NOTE — ED Notes (Signed)
ED PA informed of +COVID, states he will call her to update of test
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
## 2020-06-10 NOTE — ED Notes (Signed)
Troponin 77 on the rise, [place], EDPA notified

## 2020-06-10 NOTE — ED Notes (Signed)
PCXR at bedside.
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
## 2020-06-10 NOTE — ED Notes (Signed)
Pending Trop Per RN
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
## 2020-06-10 NOTE — ED Notes (Signed)
Called Dr. Johney Frame

## 2020-06-10 NOTE — Telephone Encounter (Signed)
PA with [facility] ED is calling to request to speak with patients cardiologist in regards to if they should admit the patient or not. Dr. Lovena Le is covering in the hospital today - transferred to DOD.
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
## 2020-06-10 NOTE — Discharge Instructions (Addendum)
Please make sure you call your cardiologist tomorrow to set up an appointment.  In the meantime you develop any chest pain, shortness of breath or worsening palpitations return to the emergency department.

## 2020-06-10 NOTE — ED Notes (Signed)
ED Provider at bedside. 

## 2020-06-11 ENCOUNTER — Ambulatory Visit: Payer: 59 | Admitting: Internal Medicine

## 2020-06-11 ENCOUNTER — Encounter: Payer: Self-pay | Admitting: *Deleted

## 2020-06-11 ENCOUNTER — Encounter: Payer: Self-pay | Admitting: Internal Medicine

## 2020-06-11 VITALS — BP 128/70 | HR 93 | Ht 63.0 in | Wt 203.0 lb

## 2020-06-11 DIAGNOSIS — I471 Supraventricular tachycardia: Secondary | ICD-10-CM | POA: Diagnosis not present
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
## 2020-06-11 NOTE — Patient Instructions (Signed)
Medication Instructions:  Your physician recommends that you continue on your current medications as directed. Please refer to the Current Medication list given to you today.  Labwork: None ordered.  Testing/Procedures: None ordered.  Follow-Up:  SEE INSTRUCTION LETTER  Any Other Special Instructions Will Be Listed Below (If Applicable).  If you need a refill on your cardiac medications before your next appointment, please call your pharmacy.     Cardiac electrophysiology: From cell to bedside (7th ed., pp. 1239-1252). [place], [place]: Elsevier.">  Cardiac Ablation Cardiac ablation is a procedure to destroy, or ablate, a small amount of heart tissue in very specific places. The heart has many electrical connections. Sometimes these connections are abnormal and can cause the heart to beat very fast or irregularly. Ablating some of the areas that cause problems can improve the heart's rhythm or return it to normal. Ablation may be done for people who:  Have Wolff-Parkinson-White syndrome.  Have fast heart rhythms (tachycardia).  Have taken medicines for an abnormal heart rhythm (arrhythmia) that were not effective or caused side effects.  Have a high-risk heartbeat that may be life-threatening. During the procedure, a small incision is made in the neck or the groin, and a long, thin tube (catheter) is inserted into the incision and moved to the heart. Small devices (electrodes) on the tip of the catheter will send out electrical currents. A type of X-ray (fluoroscopy) will be used to help guide the catheter and to provide images of the heart. Tell a health care provider about:  Any allergies you have.  All medicines you are taking, including vitamins, herbs, eye drops, creams, and over-the-counter medicines.  Any problems you or family members have had with anesthetic medicines.  Any blood disorders you have.  Any surgeries you have had.  Any medical conditions you have,  such as kidney failure.  Whether you are pregnant or may be pregnant. What are the risks? Generally, this is a safe procedure. However, problems may occur, including:  Infection.  Bruising and bleeding at the catheter insertion site.  Bleeding into the chest, especially into the sac that surrounds the heart. This is a serious complication.  Stroke or blood clots.  Damage to nearby structures or organs.  Allergic reaction to medicines or dyes.  Need for a permanent pacemaker if the normal electrical system is damaged. A pacemaker is a small computer that sends electrical signals to the heart and helps your heart beat normally.  The procedure not being fully effective. This may not be recognized until months later. Repeat ablation procedures are sometimes done. What happens before the procedure? Medicines Ask your health care provider about:  Changing or stopping your regular medicines. This is especially important if you are taking diabetes medicines or blood thinners.  Taking medicines such as aspirin and ibuprofen. These medicines can thin your blood. Do not take these medicines unless your health care provider tells you to take them.  Taking over-the-counter medicines, vitamins, herbs, and supplements. General instructions  Follow instructions from your health care provider about eating or drinking restrictions.  Plan to have someone take you home from the hospital or clinic.  If you will be going home right after the procedure, plan to have someone with you for 24 hours.  Ask your health care provider what steps will be taken to prevent infection. What happens during the procedure?  An IV will be inserted into one of your veins.  You will be given a medicine to help you relax (  sedative).  The skin on your neck or groin will be numbed.  An incision will be made in your neck or your groin.  A needle will be inserted through the incision and into a large vein in your  neck or groin.  A catheter will be inserted into the needle and moved to your heart.  Dye may be injected through the catheter to help your surgeon see the area of the heart that needs treatment.  Electrical currents will be sent from the catheter to ablate heart tissue in desired areas. There are three types of energy that may be used to do this: ? Heat (radiofrequency energy). ? Laser energy. ? Extreme cold (cryoablation).  When the tissue has been ablated, the catheter will be removed.  Pressure will be held on the insertion area to prevent a lot of bleeding.  A bandage (dressing) will be placed over the insertion area. The exact procedure may vary among health care providers and hospitals.   What happens after the procedure?  Your blood pressure, heart rate, breathing rate, and blood oxygen level will be monitored until you leave the hospital or clinic.  Your insertion area will be monitored for bleeding. You will need to lie still for a few hours to ensure that you do not bleed from the insertion area.  Do not drive for 24 hours or as long as told by your health care provider. Summary  Cardiac ablation is a procedure to destroy, or ablate, a small amount of heart tissue using an electrical current. This procedure can improve the heart rhythm or return it to normal.  Tell your health care provider about any medical conditions you may have and all medicines you are taking to treat them.  This is a safe procedure, but problems may occur. Problems may include infection, bruising, damage to nearby organs or structures, or allergic reactions to medicines.  Follow your health care provider's instructions about eating and drinking before the procedure. You may also be told to change or stop some of your medicines.  After the procedure, do not drive for 24 hours or as long as told by your health care provider. This information is not intended to replace advice given to you by your  health care provider. Make sure you discuss any questions you have with your health care provider. Document Revised: 03/12/2019 Document Reviewed: 03/12/2019 Elsevier Patient Education  2021 Elsevier Inc.      

## 2020-06-11 NOTE — Telephone Encounter (Signed)
Spoke with patient regarding upcoming appointment today. Patient confirmed time and location.

## 2020-06-21 ENCOUNTER — Emergency Department (HOSPITAL_BASED_OUTPATIENT_CLINIC_OR_DEPARTMENT_OTHER)
Admission: EM | Admit: 2020-06-21 | Discharge: 2020-06-21 | Disposition: A | Discharge status: Home / Self Care | Payer: 59 | Attending: Emergency Medicine | Admitting: Emergency Medicine

## 2020-06-21 ENCOUNTER — Encounter (HOSPITAL_BASED_OUTPATIENT_CLINIC_OR_DEPARTMENT_OTHER): Payer: Self-pay | Admitting: Emergency Medicine

## 2020-06-21 ENCOUNTER — Emergency Department (HOSPITAL_BASED_OUTPATIENT_CLINIC_OR_DEPARTMENT_OTHER): Payer: 59

## 2020-06-21 ENCOUNTER — Other Ambulatory Visit: Payer: Self-pay

## 2020-06-21 DIAGNOSIS — R1030 Lower abdominal pain, unspecified: Secondary | ICD-10-CM | POA: Insufficient documentation

## 2020-06-21 DIAGNOSIS — Z79899 Other long term (current) drug therapy: Secondary | ICD-10-CM | POA: Insufficient documentation

## 2020-06-21 DIAGNOSIS — Z7982 Long term (current) use of aspirin: Secondary | ICD-10-CM | POA: Diagnosis not present

## 2020-06-21 DIAGNOSIS — Z7984 Long term (current) use of oral hypoglycemic drugs: Secondary | ICD-10-CM | POA: Diagnosis not present

## 2020-06-21 DIAGNOSIS — I10 Essential (primary) hypertension: Secondary | ICD-10-CM | POA: Diagnosis not present

## 2020-06-21 DIAGNOSIS — E785 Hyperlipidemia, unspecified: Secondary | ICD-10-CM | POA: Diagnosis not present

## 2020-06-21 DIAGNOSIS — R112 Nausea with vomiting, unspecified: Secondary | ICD-10-CM | POA: Insufficient documentation

## 2020-06-21 DIAGNOSIS — R109 Unspecified abdominal pain: Secondary | ICD-10-CM

## 2020-06-21 DIAGNOSIS — E1169 Type 2 diabetes mellitus with other specified complication: Secondary | ICD-10-CM | POA: Diagnosis not present

## 2020-06-21 DIAGNOSIS — K219 Gastro-esophageal reflux disease without esophagitis: Secondary | ICD-10-CM | POA: Insufficient documentation

## 2020-06-21 LAB — CBC
HCT: 42 % (ref 36.0–46.0)
Hemoglobin: 14.1 g/dL (ref 12.0–15.0)
MCH: 29.5 pg (ref 26.0–34.0)
MCHC: 33.6 g/dL (ref 30.0–36.0)
MCV: 87.9 fL (ref 80.0–100.0)
Platelets: 272 10*3/uL (ref 150–400)
RBC: 4.78 MIL/uL (ref 3.87–5.11)
RDW: 13 % (ref 11.5–15.5)
WBC: 9.7 10*3/uL (ref 4.0–10.5)
nRBC: 0 % (ref 0.0–0.2)

## 2020-06-21 LAB — COMPREHENSIVE METABOLIC PANEL
ALT: 24 U/L (ref 0–44)
AST: 21 U/L (ref 15–41)
Albumin: 4.1 g/dL (ref 3.5–5.0)
Alkaline Phosphatase: 55 U/L (ref 38–126)
Anion gap: 10 (ref 5–15)
BUN: 23 mg/dL — ABNORMAL HIGH (ref 6–20)
CO2: 22 mmol/L (ref 22–32)
Calcium: 9.4 mg/dL (ref 8.9–10.3)
Chloride: 103 mmol/L (ref 98–111)
Creatinine, Ser: 0.98 mg/dL (ref 0.44–1.00)
GFR, Estimated: >60 mL/min (ref >60)
Glucose, Bld: 127 mg/dL — ABNORMAL HIGH (ref 70–99)
Potassium: 4.5 mmol/L (ref 3.5–5.1)
Sodium: 135 mmol/L (ref 135–145)
Total Bilirubin: 0.4 mg/dL (ref 0.3–1.2)
Total Protein: 8.4 g/dL — ABNORMAL HIGH (ref 6.5–8.1)

## 2020-06-21 LAB — URINALYSIS, ROUTINE W REFLEX MICROSCOPIC
Bilirubin Urine: NEGATIVE
Glucose, UA: >=500 mg/dL — AB
Hgb urine dipstick: NEGATIVE
Ketones, ur: NEGATIVE mg/dL
Leukocytes,Ua: NEGATIVE
Nitrite: NEGATIVE
Protein, ur: NEGATIVE mg/dL
Specific Gravity, Urine: 1.015 (ref 1.005–1.030)
pH: 7.5 (ref 5.0–8.0)

## 2020-06-21 LAB — URINALYSIS, MICROSCOPIC (REFLEX)

## 2020-06-21 LAB — LIPASE, BLOOD: Lipase: 41 U/L (ref 11–51)

## 2020-06-21 LAB — PREGNANCY, URINE: Preg Test, Ur: NEGATIVE

## 2020-06-21 MED ORDER — HYDROCODONE-ACETAMINOPHEN 5-325 MG PO TABS: 1.0000 | ORAL_TABLET | ORAL | 0 refills | Status: DC | PRN

## 2020-06-21 MED ORDER — OXYCODONE-ACETAMINOPHEN 5-325 MG PO TABS
1.0000 | ORAL_TABLET | Freq: Once | ORAL | Status: AC
  Administered 2020-06-21: 1 via ORAL
  Filled 2020-06-21: qty 1

## 2020-06-21 MED ORDER — HYDROCODONE-ACETAMINOPHEN 5-325 MG PO TABS: 1.0000 | ORAL_TABLET | Freq: Once | ORAL | Status: DC

## 2020-06-21 MED ORDER — ONDANSETRON HCL 4 MG/2ML IJ SOLN
4.0000 mg | Freq: Once | INTRAMUSCULAR | Status: AC
  Administered 2020-06-21: 4 mg via INTRAVENOUS
  Filled 2020-06-21: qty 2

## 2020-06-21 MED ORDER — FENTANYL CITRATE (PF) 100 MCG/2ML IJ SOLN
50.0000 ug | INTRAMUSCULAR | Status: DC | PRN
  Administered 2020-06-21: 50 ug via INTRAVENOUS
  Filled 2020-06-21: qty 2

## 2020-06-21 MED ORDER — ONDANSETRON HCL 4 MG PO TABS: 4.0000 mg | ORAL_TABLET | Freq: Four times a day (QID) | ORAL | 0 refills | Status: DC | PRN

## 2020-06-21 MED ORDER — LACTATED RINGERS IV BOLUS
1000.0000 mL | Freq: Once | INTRAVENOUS | Status: AC
  Administered 2020-06-21: 1000 mL via INTRAVENOUS

## 2020-06-21 MED ORDER — MORPHINE SULFATE (PF) 4 MG/ML IV SOLN
4.0000 mg | Freq: Once | INTRAVENOUS | Status: DC
  Filled 2020-06-21: qty 1

## 2020-06-21 MED ORDER — IOHEXOL 300 MG/ML  SOLN
100.0000 mL | Freq: Once | INTRAMUSCULAR | Status: AC | PRN
  Administered 2020-06-21: 100 mL via INTRAVENOUS

## 2020-06-21 MED ORDER — ONDANSETRON HCL 4 MG/2ML IJ SOLN
4.0000 mg | Freq: Once | INTRAMUSCULAR | Status: DC
  Filled 2020-06-21: qty 2

## 2020-06-21 NOTE — Discharge Instructions (Addendum)
Return if symptoms are getting worse. °

## 2020-06-21 NOTE — ED Triage Notes (Signed)
Patient presents with complaints of lower abd pain onset this evening; states constipation and dark stool noted this evening. Patient denies vomiting.

## 2020-06-21 NOTE — ED Provider Notes (Signed)
[facility] Provider Note   CSN: [id number] Arrival date & time: 06/21/20  0030    History Chief Complaint  Patient presents with  . Abdominal Pain    Susan Whitaker is a 50 y.o. female.  The history is provided by the patient.  Abdominal Pain She has history of hypertension, hyperlipidemia, GERD, bowel obstruction and comes in because of crampy mid and lower abdominal pain which started tonight.  Pain is severe and she rates it at 10/10.  There is no radiation of pain.  There has been nausea and vomiting.  There was momentary improvement of the pain following emesis.  She has not had any bowel movements or flatus.  Pain is similar to what she had with a bowel obstruction 1 year ago.  She also relates that she has been having milder pain intermittently over the last 3 weeks.  She had been trying to get into see her surgeon, but whether caused cancellation of multiple appointments.  Past Medical History:  Diagnosis Date  . Allergy    allergic rhinitis  . DM type 2 (diabetes mellitus, type 2) ([place])   . FHx: hypertension   . Gastric ulcer   . GERD (gastroesophageal reflux disease)   . H/O constipation   . H/O hemorrhoids   . H/O varicella   . History of small bowel obstruction 06/02/2019  . Hyperlipidemia   . Hypertension   . Hypokalemia 2019  . Increased BMI 06/2010  . Menorrhagia   . Primary hyperaldosteronism ([place]) 02/21/2012  . SVT (supraventricular tachycardia) ([place])    Noted 11/2011 admission  . Thrombocytopenia ([place])   . Thyroid fullness 08/2005   monitoring no meds for followed by pcp    Patient Active Problem List   Diagnosis Date Noted  . History of small bowel obstruction 06/02/2019  . Anxiety state 07/14/2012  . Primary hyperaldosteronism ([place]) 02/21/2012  . Microalbuminuria 02/15/2012  . Multiple thyroid nodules 01/04/2012  . SVT (supraventricular tachycardia) ([place]) 12/29/2011  . Hypokalemia 11/28/2011  . Cervical pain (neck)  05/28/2011  . EXTERNAL HEMORRHOIDS WITHOUT MENTION COMP 02/07/2009  . Diabetes type 2, controlled ([place]) 02/07/2009  . ALLERGIC RHINITIS 05/24/2008  . Hyperlipemia 04/22/2008  . THROMBOCYTOSIS 04/22/2008  . URINARY INCONTINENCE 04/22/2008  . Essential hypertension 01/04/2007  . GERD 01/04/2007    Past Surgical History:  Procedure Laterality Date  . ABDOMINAL SURGERY     11 months of age-- unsure of type of surgery  . COLONOSCOPY  04/2016   hemorrhoids--normal per pt with Dr Collene Mares  . DILATATION & CURETTAGE/HYSTEROSCOPY WITH MYOSURE N/A 02/08/2020   Procedure: DILATATION & CURETTAGE/HYSTEROSCOPY WITH MYOSURE;  Surgeon: Waymon Amato, MD;  Location: [facility];  Service: Gynecology;  Laterality: N/A;  . HEMORRHOID SURGERY  2005/2006  . INTRAUTERINE DEVICE (IUD) INSERTION N/A 02/08/2020   Procedure: INTRAUTERINE DEVICE (IUD) INSERTION UNDER ULTRASOUND GUIDANCE;  Surgeon: Waymon Amato, MD;  Location: [facility];  Service: Gynecology;  Laterality: N/A;  Mirena  . LAPAROTOMY N/A 06/03/2019   Procedure: EXPLORATORY LAPAROTOMY WITH LYSIS OF ADHESIONS;  Surgeon: Jovita Kussmaul, MD;  Location: [facility];  Service: General;  Laterality: N/A;  . UTERINE FIBROID EMBOLIZATION  2010   at [facility]     OB History    Gravida  4   Para  2   Term  2   Preterm      AB  1   Living  2     SAB  1  IAB      Ectopic      Multiple      Live Births              Family History  Problem Relation Age of Onset  . Cancer Mother        breast  . Stroke Mother   . Cancer Maternal Grandmother        breast  . Hypertension Maternal Grandmother   . Cancer Other        breast, lung  . Hypertension Other   . Stroke Other   . Vision loss Father        some  . Heart disease Father        Rhythm disturbance  . Hypertension Sister   . Hypertension Brother   . Hypertension Brother   . Diabetes Neg Hx     Social History   Tobacco Use  . Smoking status: Never  Smoker  . Smokeless tobacco: Never Used  Vaping Use  . Vaping Use: Never used  Substance Use Topics  . Alcohol use: No  . Drug use: No    Home Medications Prior to Admission medications   Medication Sig Start Date End Date Taking? Authorizing Provider  acetaminophen (TYLENOL) 500 MG tablet Take 2 tablets (1,000 mg total) by mouth every 6 (six) hours as needed for mild pain or headache. 06/11/19   Norm Parcel, PA-C  amLODipine (NORVASC) 10 MG tablet Take 1 tablet by mouth once daily 03/10/20   Debbrah Alar, NP  aspirin EC 81 MG tablet Take 1 tablet (81 mg total) by mouth daily. 08/11/15   Debbrah Alar, NP  atorvastatin (LIPITOR) 40 MG tablet Take 1 tablet (40 mg total) by mouth daily. 04/29/20   Debbrah Alar, NP  docusate sodium (COLACE) 100 MG capsule Take 1 capsule (100 mg total) by mouth daily as needed for mild constipation. 06/11/19   Norm Parcel, PA-C  Dulaglutide (TRULICITY) 3 0000000 SOPN Inject 3 mg once a week 05/08/20   Elayne Snare, MD  glucose blood (ACCU-CHEK GUIDE) test strip 1 each by Other route 3 (three) times daily. Use as instructed to check once daily. 11/10/17   Elayne Snare, MD  ibuprofen (ADVIL) 800 MG tablet Take 1 tablet (800 mg total) by mouth every 8 (eight) hours as needed for moderate pain or cramping. 02/08/20   Waymon Amato, MD  INVOKANA 300 MG TABS tablet TAKE 1 TABLET BY MOUTH ONCE DAILY BEFORE BREAKFAST 06/09/20   Elayne Snare, MD  losartan (COZAAR) 100 MG tablet Take 1 tablet by mouth once daily 03/10/20   Debbrah Alar, NP  metoprolol tartrate (LOPRESSOR) 100 MG tablet Take 1 tablet (100 mg total) by mouth 2 (two) times daily. 06/10/20   Debbrah Alar, NP  [facility] DELICA LANCETS FINE MISC Use to check blood sugar 3 times daily 09/28/16   Elayne Snare, MD  Polyethyl Glycol-Propyl Glycol (SYSTANE PRESERVATIVE FREE OP) Place 2 drops into both eyes 4 (four) times daily as needed (irritation, dryness).     [provider]   polyethylene glycol (MIRALAX / GLYCOLAX) 17 g packet Take 17 g by mouth daily as needed for mild constipation. 06/11/19   Norm Parcel, PA-C  spironolactone (ALDACTONE) 100 MG tablet Take 1 tablet by mouth once daily 03/10/20   Debbrah Alar, NP    Allergies    Lisinopril  Review of Systems   Review of Systems  Gastrointestinal: Positive for abdominal pain.  All other systems  reviewed and are negative.   Physical Exam Updated Vital Signs BP 127/77 (BP Location: Left Arm)   Pulse 80   Temp 98.7 F (37.1 C) (Oral)   Resp 19   Ht 5\' 3"  (1.6 m)   Wt 92.1 kg   LMP 05/25/2020   SpO2 98%   BMI 35.97 kg/m   Physical Exam Vitals and nursing note reviewed.   50 year old female, resting comfortably and in no acute distress. Vital signs are normal. Oxygen saturation is 98%, which is normal. Head is normocephalic and atraumatic. PERRLA, EOMI. Oropharynx is clear. Neck is nontender and supple without adenopathy or JVD. Back is nontender and there is no CVA tenderness. Lungs are clear without rales, wheezes, or rhonchi. Chest is nontender. Heart has regular rate and rhythm without murmur. Abdomen is soft, flat, with moderate mid abdominal tenderness.  There is no rebound or guarding.  There are no masses or hepatosplenomegaly and peristalsis is hypoactive. Extremities have no cyanosis or edema, full range of motion is present. Skin is warm and dry without rash. Neurologic: Mental status is normal, cranial nerves are intact, there are no motor or sensory deficits.  ED Results / Procedures / Treatments   Labs (all labs ordered are listed, but only abnormal results are displayed) Labs Reviewed  COMPREHENSIVE METABOLIC PANEL - Abnormal; Notable for the following components:      Result Value   Glucose, Bld 127 (*)    BUN 23 (*)    Total Protein 8.4 (*)    All other components within normal limits  URINALYSIS, ROUTINE W REFLEX MICROSCOPIC - Abnormal; Notable for the  following components:   Color, Urine STRAW (*)    Glucose, UA >=500 (*)    All other components within normal limits  URINALYSIS, MICROSCOPIC (REFLEX) - Abnormal; Notable for the following components:   Bacteria, UA RARE (*)    All other components within normal limits  LIPASE, BLOOD  CBC  PREGNANCY, URINE   Radiology CT ABDOMEN PELVIS W CONTRAST  Result Date: 06/21/2020 CLINICAL DATA:  Lower abdominal pain.  Bowel obstruction suspected. EXAM: CT ABDOMEN AND PELVIS WITH CONTRAST TECHNIQUE: Multidetector CT imaging of the abdomen and pelvis was performed using the standard protocol following bolus administration of intravenous contrast. CONTRAST:  119mL OMNIPAQUE IOHEXOL 300 MG/ML  SOLN COMPARISON:  06/02/2019 FINDINGS: Lower chest: Clear.  No active process. Hepatobiliary: Liver parenchyma is normal. Gallbladder is collapsed but otherwise unremarkable. Pancreas: Normal Spleen: Small and unremarkable. Adrenals/Urinary Tract: Adrenal glands are normal. Kidneys are normal. Bladder is normal. Stomach/Bowel: Stomach is full of ingested material as if the patient has eaten a recent meal. Small bowel pattern is normal. No dilated bowel seen today. Colon is unremarkable. No sign of diverticulosis or diverticulitis. Vascular/Lymphatic: Aortic atherosclerotic calcification. No aneurysm. IVC is normal. No adenopathy. Reproductive: Multiple leiomyomas of the uterus as seen previously. No adnexal mass. IUD in place. Other: No free fluid or air. Musculoskeletal: Ordinary mild lower lumbar degenerative changes. IMPRESSION: 1. No acute finding by CT. No evidence of bowel obstruction or other acute bowel pathology. 2. Stomach is full of ingested material as if the patient has eaten a recent meal. 3. Multiple leiomyomas of the uterus as seen previously. IUD in place. 4. Aortic atherosclerosis. Aortic Atherosclerosis (ICD10-I70.0). Electronically Signed   By: Nelson Chimes M.D.   On: 06/21/2020 04:01     Procedures Procedures   Medications Ordered in ED Medications  fentaNYL (SUBLIMAZE) injection 50 mcg (50 mcg Intravenous Given  06/21/20 0129)  ondansetron (ZOFRAN) injection 4 mg (4 mg Intravenous Given 06/21/20 0129)    ED Course  I have reviewed the triage vital signs and the nursing notes.  Pertinent labs & imaging results that were available during my care of the patient were reviewed by me and considered in my medical decision making (see chart for details).  MDM Rules/Calculators/A&P Abdominal pain with vomiting suspicious for recurrent bowel obstruction.  Differential also includes multiple conditions such as appendicitis, diverticulitis, urinary tract infection, urolithiasis.  Differential does include conditions with significant morbidity and mortality.  Old records are reviewed, confirming hospitalization for small bowel obstruction in January 2021 and laparoscopic lysis of adhesions.  Labs are reassuring.  CT scan shows no acute process, specifically, no evidence of small bowel obstruction.  On reexam, abdomen continues to be benign.  I have explained to patient that it is possible that she is early in the process and CT findings take time to develop.  She is discharged with prescription for ondansetron and a small number of hydrocodone-acetaminophen tablets.  She is to return should pain worsen or nausea not be adequately controlled at home.  Final Clinical Impression(s) / ED Diagnoses Final diagnoses:  Abdominal pain, unspecified abdominal location  Non-intractable vomiting with nausea, unspecified vomiting type    Rx / DC Orders ED Discharge Orders         Ordered    ondansetron (ZOFRAN) 4 MG tablet  Every 6 hours PRN        06/21/20 0412    HYDROcodone-acetaminophen (NORCO) 5-325 MG tablet  Every 4 hours PRN,   Status:  Discontinued        06/21/20 0415    HYDROcodone-acetaminophen (NORCO) 5-325 MG tablet  Every 4 hours PRN        06/21/20 [id number]           Delora Fuel, MD [id number] [phone]

## 2020-06-24 ENCOUNTER — Other Ambulatory Visit: Payer: Self-pay

## 2020-06-25 ENCOUNTER — Ambulatory Visit: Payer: 59 | Admitting: Nurse Practitioner

## 2020-06-25 ENCOUNTER — Encounter: Payer: Self-pay | Admitting: Nurse Practitioner

## 2020-06-25 VITALS — BP 118/70 | HR 76 | Temp 97.1°F | Ht 63.0 in | Wt 204.2 lb

## 2020-06-25 DIAGNOSIS — E7800 Pure hypercholesterolemia, unspecified: Secondary | ICD-10-CM

## 2020-06-25 DIAGNOSIS — I10 Essential (primary) hypertension: Secondary | ICD-10-CM | POA: Diagnosis not present

## 2020-06-25 DIAGNOSIS — E1122 Type 2 diabetes mellitus with diabetic chronic kidney disease: Secondary | ICD-10-CM | POA: Diagnosis not present

## 2020-06-25 LAB — MICROALBUMIN / CREATININE URINE RATIO
Creatinine,U: 77.2 mg/dL
Microalb Creat Ratio: 0.9 mg/g (ref 0.0–30.0)
Microalb, Ur: <0.7 mg/dL (ref 0.0–1.9)

## 2020-06-25 LAB — LIPID PANEL
Cholesterol: 164 mg/dL (ref 0–200)
HDL: 43.8 mg/dL (ref >39.00)
LDL Cholesterol: 107 mg/dL — ABNORMAL HIGH (ref 0–99)
NonHDL: 119.91
Total CHOL/HDL Ratio: 4
Triglycerides: 64 mg/dL (ref 0.0–149.0)
VLDL: 12.8 mg/dL (ref 0.0–40.0)

## 2020-06-25 LAB — HEMOGLOBIN A1C: Hgb A1c MFr Bld: 6.4 % (ref 4.6–6.5)

## 2020-06-25 NOTE — Progress Notes (Signed)
Subjective:  Patient ID: Susan Whitaker, female    DOB: [birth year]  Age: 50 y.o. MRN: [id number]  CC: Establish Care (TOC-[place]/Pt in need of medication refills.)  HPI  Essential hypertension BP at goal with amlodipine, losartan and metoprolol BP Readings from Last 3 Encounters:  06/25/20 118/70  06/21/20 108/62  06/11/20 128/70   Maintain current medications F/up in 105months  Diabetes type 2, controlled Controlled with hgbA1c of 6.4 Negative urine microalbumin Managed by Dr. Dwyane Dee LDL at goal with atorvastatin   Reviewed past Medical, Social and Family history today.  Outpatient Medications Prior to Visit  Medication Sig Dispense Refill  . acetaminophen (TYLENOL) 500 MG tablet Take 2 tablets (1,000 mg total) by mouth every 6 (six) hours as needed for mild pain or headache.    Marland Kitchen aspirin EC 81 MG tablet Take 1 tablet (81 mg total) by mouth daily.    Marland Kitchen atorvastatin (LIPITOR) 40 MG tablet Take 1 tablet (40 mg total) by mouth daily. 90 tablet 1  . docusate sodium (COLACE) 100 MG capsule Take 1 capsule (100 mg total) by mouth daily as needed for mild constipation.    . Dulaglutide (TRULICITY) 3 HM/0.9OB SOPN Inject 3 mg once a week 4 mL 2  . glucose blood (ACCU-CHEK GUIDE) test strip 1 each by Other route 3 (three) times daily. Use as instructed to check once daily. 100 each 12  . HYDROcodone-acetaminophen (NORCO) 5-325 MG tablet Take 1 tablet by mouth every 4 (four) hours as needed for moderate pain. 6 tablet 0  . ibuprofen (ADVIL) 800 MG tablet Take 1 tablet (800 mg total) by mouth every 8 (eight) hours as needed for moderate pain or cramping. 30 tablet 0  . INVOKANA 300 MG TABS tablet TAKE 1 TABLET BY MOUTH ONCE DAILY BEFORE BREAKFAST 30 tablet 0  . metoprolol tartrate (LOPRESSOR) 100 MG tablet Take 1 tablet (100 mg total) by mouth 2 (two) times daily. 180 tablet 1  . ondansetron (ZOFRAN) 4 MG tablet Take 1 tablet (4 mg total) by mouth every 6 (six) hours as needed for nausea  or vomiting. 12 tablet 0  . ONETOUCH DELICA LANCETS FINE MISC Use to check blood sugar 3 times daily 100 each 3  . Polyethyl Glycol-Propyl Glycol (SYSTANE PRESERVATIVE FREE OP) Place 2 drops into both eyes 4 (four) times daily as needed (irritation, dryness).     . polyethylene glycol (MIRALAX / GLYCOLAX) 17 g packet Take 17 g by mouth daily as needed for mild constipation.    Marland Kitchen spironolactone (ALDACTONE) 100 MG tablet Take 1 tablet by mouth once daily 90 tablet 0  . amLODipine (NORVASC) 10 MG tablet Take 1 tablet by mouth once daily 90 tablet 0  . losartan (COZAAR) 100 MG tablet Take 1 tablet by mouth once daily 90 tablet 0   No facility-administered medications prior to visit.    ROS See HPI  Objective:  BP 118/70 (BP Location: Left Arm, Patient Position: Sitting, Cuff Size: Large)   Pulse 76   Temp (!) 97.1 F (36.2 C) (Temporal)   Ht 5\' 3"  (1.6 m)   Wt 204 lb 3.2 oz (92.6 kg)   SpO2 98%   BMI 36.17 kg/m   Physical Exam Vitals reviewed.  Cardiovascular:     Rate and Rhythm: Normal rate and regular rhythm.     Pulses: Normal pulses.     Heart sounds: Normal heart sounds.  Pulmonary:     Effort: Pulmonary effort is normal.  Breath sounds: Normal breath sounds.  Neurological:     Mental Status: She is alert and oriented to person, place, and time.     Assessment & Plan:  This visit occurred during the SARS-CoV-2 public health emergency.  Safety protocols were in place, including screening questions prior to the visit, additional usage of staff PPE, and extensive cleaning of exam room while observing appropriate contact time as indicated for disinfecting solutions.   Susan Whitaker was seen today for establish care.  Diagnoses and all orders for this visit:  Essential hypertension -     amLODipine (NORVASC) 10 MG tablet; Take 1 tablet (10 mg total) by mouth daily. -     losartan (COZAAR) 100 MG tablet; Take 1 tablet (100 mg total) by mouth daily.  Controlled type 2 diabetes  mellitus with chronic kidney disease, without long-term current use of insulin, unspecified CKD stage (HCC) -     Hemoglobin A1c -     Microalbumin / creatinine urine ratio  Pure hypercholesterolemia -     Lipid panel   Problem List Items Addressed This Visit      Cardiovascular and Mediastinum   Essential hypertension - Primary    BP at goal with amlodipine, losartan and metoprolol BP Readings from Last 3 Encounters:  06/25/20 118/70  06/21/20 108/62  06/11/20 128/70   Maintain current medications F/up in 24months      Relevant Medications   amLODipine (NORVASC) 10 MG tablet   losartan (COZAAR) 100 MG tablet     Endocrine   Diabetes type 2, controlled ([place])    Controlled with hgbA1c of 6.4 Negative urine microalbumin Managed by Dr. Dwyane Dee LDL at goal with atorvastatin      Relevant Medications   losartan (COZAAR) 100 MG tablet   Other Relevant Orders   Hemoglobin A1c (Completed)   Microalbumin / creatinine urine ratio (Completed)     Other   Hyperlipemia   Relevant Medications   amLODipine (NORVASC) 10 MG tablet   losartan (COZAAR) 100 MG tablet   Other Relevant Orders   Lipid panel (Completed)      Follow-up: Return in about 3 months (around 09/22/2020) for DM and HTN, hyperlipidemia.  Wilfred Lacy, NP

## 2020-06-25 NOTE — Patient Instructions (Addendum)
To maintain good gut health: maintain adequate oral hydration, high fiber/low fat/low carb diet and use of probiotic x 7days every 30-60days. (IB Guard or florastor or curturelle).  Go to lab for blood draw.

## 2020-06-28 ENCOUNTER — Encounter: Payer: Self-pay | Admitting: Nurse Practitioner

## 2020-06-28 MED ORDER — LOSARTAN POTASSIUM 100 MG PO TABS: 100.0000 mg | ORAL_TABLET | Freq: Every day | ORAL | 3 refills | Status: DC

## 2020-06-28 MED ORDER — AMLODIPINE BESYLATE 10 MG PO TABS: 10.0000 mg | ORAL_TABLET | Freq: Every day | ORAL | 3 refills | Status: DC

## 2020-06-28 NOTE — Assessment & Plan Note (Signed)
BP at goal with amlodipine, losartan and metoprolol BP Readings from Last 3 Encounters:  06/25/20 118/70  06/21/20 108/62  06/11/20 128/70   Maintain current medications F/up in 55months

## 2020-06-28 NOTE — Assessment & Plan Note (Signed)
Controlled with hgbA1c of 6.4 Negative urine microalbumin Managed by Dr. Dwyane Dee LDL at goal with atorvastatin

## 2020-07-15 ENCOUNTER — Telehealth: Payer: Self-pay

## 2020-07-15 NOTE — Progress Notes (Signed)
Pt called to inform her that she will not need a covid test for any procedures for 90 days (09/08/20) from her + covid test on 06/10/20. Pt made aware to inform her providers, as well.

## 2020-07-15 NOTE — Telephone Encounter (Signed)
Call placed to Pt.  Advised needed to reschedule procedure.  New dates given.    Pt will review with her family and respond via mychart.

## 2020-07-16 ENCOUNTER — Other Ambulatory Visit: Payer: 59

## 2020-07-16 ENCOUNTER — Inpatient Hospital Stay (HOSPITAL_COMMUNITY): Admission: RE | Admit: 2020-07-16 | Payer: 59 | Source: Ambulatory Visit

## 2020-07-18 ENCOUNTER — Ambulatory Visit (HOSPITAL_COMMUNITY): Admission: RE | Admit: 2020-07-18 | Payer: 59 | Source: Home / Self Care | Admitting: Internal Medicine

## 2020-07-18 ENCOUNTER — Encounter (HOSPITAL_COMMUNITY): Admission: RE | Payer: Self-pay | Source: Home / Self Care

## 2020-07-18 SURGERY — SVT ABLATION

## 2020-07-29 ENCOUNTER — Telehealth: Payer: Self-pay | Admitting: Endocrinology

## 2020-07-29 ENCOUNTER — Other Ambulatory Visit: Payer: Self-pay | Admitting: *Deleted

## 2020-07-29 DIAGNOSIS — E1169 Type 2 diabetes mellitus with other specified complication: Secondary | ICD-10-CM

## 2020-07-29 MED ORDER — CANAGLIFLOZIN 300 MG PO TABS: 300.0000 mg | ORAL_TABLET | Freq: Every day | ORAL | 0 refills | Status: DC

## 2020-07-29 MED ORDER — TRULICITY 3 MG/0.5ML ~~LOC~~ SOAJ: SUBCUTANEOUS | 2 refills | Status: DC

## 2020-07-29 NOTE — Telephone Encounter (Signed)
invokana and trulicity sent to the pharmacy.

## 2020-07-29 NOTE — Telephone Encounter (Signed)
MEDICATION: invokana and trulicity  PHARMACY:   [place], [place]. Phone:  [phone]  Fax:  [phone]       HAS THE PATIENT CONTACTED [facility]?  no  IS THIS A 90 DAY SUPPLY : no  IS PATIENT OUT OF MEDICATION: yes  IF NOT; HOW MUCH IS LEFT:   LAST APPOINTMENT DATE: @1 /24/2022  NEXT APPOINTMENT DATE:@5 /10/2020  DO WE HAVE YOUR PERMISSION TO LEAVE A DETAILED MESSAGE?:  OTHER COMMENTS:    **Let patient know to contact pharmacy at the end of the day to make sure medication is ready. **  ** Please notify patient to allow 48-72 hours to process**  **Encourage patient to contact the pharmacy for refills or they can request refills through [facility]**

## 2020-08-20 ENCOUNTER — Ambulatory Visit: Payer: 59 | Admitting: Internal Medicine

## 2020-08-29 ENCOUNTER — Encounter: Payer: Self-pay | Admitting: Internal Medicine

## 2020-08-29 ENCOUNTER — Ambulatory Visit: Payer: 59 | Admitting: Internal Medicine

## 2020-08-29 ENCOUNTER — Other Ambulatory Visit: Payer: Self-pay

## 2020-08-29 VITALS — BP 124/76 | HR 73 | Ht 63.0 in | Wt 198.0 lb

## 2020-08-29 DIAGNOSIS — I471 Supraventricular tachycardia: Secondary | ICD-10-CM

## 2020-08-29 NOTE — Progress Notes (Signed)
HPI [name] returns today to discuss catheter ablation of SVT. She is a pleasant 50 yo woman with adenosine sensitive SVT despite medical therapy. The episodes start and stop suddenly. She would like to pursue catheter ablation.  Allergies  Allergen Reactions  . Lisinopril Cough     Current Outpatient Medications  Medication Sig Dispense Refill  . acetaminophen (TYLENOL) 500 MG tablet Take 2 tablets (1,000 mg total) by mouth every 6 (six) hours as needed for mild pain or headache.    Marland Kitchen amLODipine (NORVASC) 10 MG tablet Take 1 tablet (10 mg total) by mouth daily. 90 tablet 3  . aspirin EC 81 MG tablet Take 1 tablet (81 mg total) by mouth daily.    Marland Kitchen atorvastatin (LIPITOR) 40 MG tablet Take 1 tablet (40 mg total) by mouth daily. 90 tablet 1  . canagliflozin (INVOKANA) 300 MG TABS tablet Take 1 tablet (300 mg total) by mouth daily before breakfast. 30 tablet 0  . docusate sodium (COLACE) 100 MG capsule Take 1 capsule (100 mg total) by mouth daily as needed for mild constipation.    . Dulaglutide (TRULICITY) 3 DX/4.1OI SOPN Inject 3 mg once a week 4 mL 2  . glucose blood (ACCU-CHEK GUIDE) test strip 1 each by Other route 3 (three) times daily. Use as instructed to check once daily. 100 each 12  . HYDROcodone-acetaminophen (NORCO) 5-325 MG tablet Take 1 tablet by mouth every 4 (four) hours as needed for moderate pain. 6 tablet 0  . ibuprofen (ADVIL) 800 MG tablet Take 1 tablet (800 mg total) by mouth every 8 (eight) hours as needed for moderate pain or cramping. 30 tablet 0  . losartan (COZAAR) 100 MG tablet Take 1 tablet (100 mg total) by mouth daily. 90 tablet 3  . metoprolol tartrate (LOPRESSOR) 100 MG tablet Take 1 tablet (100 mg total) by mouth 2 (two) times daily. 180 tablet 1  . ondansetron (ZOFRAN) 4 MG tablet Take 1 tablet (4 mg total) by mouth every 6 (six) hours as needed for nausea or vomiting. 12 tablet 0  . ONETOUCH DELICA LANCETS FINE MISC Use to check blood sugar 3  times daily 100 each 3  . Polyethyl Glycol-Propyl Glycol (SYSTANE PRESERVATIVE FREE OP) Place 2 drops into both eyes 4 (four) times daily as needed (irritation, dryness).     . polyethylene glycol (MIRALAX / GLYCOLAX) 17 g packet Take 17 g by mouth daily as needed for mild constipation.    Marland Kitchen spironolactone (ALDACTONE) 100 MG tablet Take 1 tablet by mouth once daily (Patient taking differently: Take 100 mg by mouth daily.) 90 tablet 0   No current facility-administered medications for this visit.     Past Medical History:  Diagnosis Date  . Allergy    allergic rhinitis  . DM type 2 (diabetes mellitus, type 2) ([place])   . FHx: hypertension   . Gastric ulcer   . GERD (gastroesophageal reflux disease)   . H/O constipation   . H/O hemorrhoids   . H/O varicella   . History of small bowel obstruction 06/02/2019  . Hyperlipidemia   . Hypertension   . Hypokalemia 2019  . Increased BMI 06/2010  . Menorrhagia   . Primary hyperaldosteronism ([place]) 02/21/2012  . SVT (supraventricular tachycardia) ([place])    Noted 11/2011 admission  . Thrombocytopenia ([place])   . Thyroid fullness 08/2005   monitoring no meds for followed by pcp    ROS:   All systems reviewed and negative  except as noted in the HPI.   Past Surgical History:  Procedure Laterality Date  . ABDOMINAL SURGERY     41 months of age-- unsure of type of surgery  . COLONOSCOPY  04/2016   hemorrhoids--normal per pt with Dr Collene Mares  . DILATATION & CURETTAGE/HYSTEROSCOPY WITH MYOSURE N/A 02/08/2020   Procedure: DILATATION & CURETTAGE/HYSTEROSCOPY WITH MYOSURE;  Surgeon: Waymon Amato, MD;  Location: [facility];  Service: Gynecology;  Laterality: N/A;  . HEMORRHOID SURGERY  2005/2006  . INTRAUTERINE DEVICE (IUD) INSERTION N/A 02/08/2020   Procedure: INTRAUTERINE DEVICE (IUD) INSERTION UNDER ULTRASOUND GUIDANCE;  Surgeon: Waymon Amato, MD;  Location: [facility];  Service: Gynecology;  Laterality: N/A;  Mirena  .  LAPAROTOMY N/A 06/03/2019   Procedure: EXPLORATORY LAPAROTOMY WITH LYSIS OF ADHESIONS;  Surgeon: Jovita Kussmaul, MD;  Location: [facility];  Service: General;  Laterality: N/A;  . UTERINE FIBROID EMBOLIZATION  2010   at [facility]     Family History  Problem Relation Age of Onset  . Cancer Mother        breast  . Stroke Mother   . Cancer Maternal Grandmother        breast  . Hypertension Maternal Grandmother   . Cancer Other        breast, lung  . Hypertension Other   . Stroke Other   . Vision loss Father        some  . Heart disease Father        Rhythm disturbance  . Hypertension Sister   . Hypertension Brother   . Hypertension Brother   . Diabetes Neg Hx      Social History   Socioeconomic History  . Marital status: Single    Spouse name: Not on file  . Number of children: 2  . Years of education: Not on file  . Highest education level: Not on file  Occupational History  . Occupation: YOUTH EMPLOYMENT    Employer: [employer]  Tobacco Use  . Smoking status: Never Smoker  . Smokeless tobacco: Never Used  Vaping Use  . Vaping Use: Never used  Substance and Sexual Activity  . Alcohol use: No  . Drug use: No  . Sexual activity: Not on file  Other Topics Concern  . Not on file  Social History Narrative   Works at [employer] Strain: Not on [employer] Insecurity: Not on file  Transportation Needs: Not on file  Physical Activity: Not on file  Stress: Not on file  Social Connections: Not on file  Intimate Partner Violence: Not on file     BP 124/76   Pulse 73   Ht 5\' 3"  (1.6 m)   Wt 198 lb (89.8 kg)   SpO2 98%   BMI 35.07 kg/m   Physical Exam:  Well appearing NAD HEENT: Unremarkable Neck:  No JVD, no thyromegally Lymphatics:  No adenopathy Back:  No CVA tenderness Lungs:  Clear HEART:  Regular rate rhythm, no murmurs, no rubs, no clicks Abd:  soft, positive bowel sounds, no  organomegally, no rebound, no guarding Ext:  2 plus pulses, no edema, no cyanosis, no clubbing Skin:  No rashes no nodules Neuro:  CN II through XII intact, motor grossly intact  EKG - NSR  Assess/Plan: 1. SVT - I have discussed the indictions/risk/benefits/goals/expectations of catheter ablation and she wishes to proceed.  2. HTN -  her bp is controlled.   Carleene Overlie Jahbari Repinski,MD

## 2020-08-29 NOTE — Patient Instructions (Addendum)
Medication Instructions:  Your physician recommends that you continue on your current medications as directed. Please refer to the Current Medication list given to you today.  Labwork: None ordered.  Testing/Procedures: None ordered.  Follow-Up:  SEE INSTRUCTION LETTER  Any Other Special Instructions Will Be Listed Below (If Applicable).  If you need a refill on your cardiac medications before your next appointment, please call your pharmacy.     Cardiac electrophysiology: From cell to bedside (7th ed., pp. 7829-5621). [place], [place]: Elsevier.">  Cardiac Ablation Cardiac ablation is a procedure to destroy, or ablate, a small amount of heart tissue in very specific places. The heart has many electrical connections. Sometimes these connections are abnormal and can cause the heart to beat very fast or irregularly. Ablating some of the areas that cause problems can improve the heart's rhythm or return it to normal. Ablation may be done for people who:  Have Wolff-Parkinson-White syndrome.  Have fast heart rhythms (tachycardia).  Have taken medicines for an abnormal heart rhythm (arrhythmia) that were not effective or caused side effects.  Have a high-risk heartbeat that may be life-threatening. During the procedure, a small incision is made in the neck or the groin, and a long, thin tube (catheter) is inserted into the incision and moved to the heart. Small devices (electrodes) on the tip of the catheter will send out electrical currents. A type of X-ray (fluoroscopy) will be used to help guide the catheter and to provide images of the heart. Tell a health care provider about:  Any allergies you have.  All medicines you are taking, including vitamins, herbs, eye drops, creams, and over-the-counter medicines.  Any problems you or family members have had with anesthetic medicines.  Any blood disorders you have.  Any surgeries you have had.  Any medical conditions you have,  such as kidney failure.  Whether you are pregnant or may be pregnant. What are the risks? Generally, this is a safe procedure. However, problems may occur, including:  Infection.  Bruising and bleeding at the catheter insertion site.  Bleeding into the chest, especially into the sac that surrounds the heart. This is a serious complication.  Stroke or blood clots.  Damage to nearby structures or organs.  Allergic reaction to medicines or dyes.  Need for a permanent pacemaker if the normal electrical system is damaged. A pacemaker is a small computer that sends electrical signals to the heart and helps your heart beat normally.  The procedure not being fully effective. This may not be recognized until months later. Repeat ablation procedures are sometimes done. What happens before the procedure? Medicines Ask your health care provider about:  Changing or stopping your regular medicines. This is especially important if you are taking diabetes medicines or blood thinners.  Taking medicines such as aspirin and ibuprofen. These medicines can thin your blood. Do not take these medicines unless your health care provider tells you to take them.  Taking over-the-counter medicines, vitamins, herbs, and supplements. General instructions  Follow instructions from your health care provider about eating or drinking restrictions.  Plan to have someone take you home from the hospital or clinic.  If you will be going home right after the procedure, plan to have someone with you for 24 hours.  Ask your health care provider what steps will be taken to prevent infection. What happens during the procedure?  An IV will be inserted into one of your veins.  You will be given a medicine to help you relax (  sedative).  The skin on your neck or groin will be numbed.  An incision will be made in your neck or your groin.  A needle will be inserted through the incision and into a large vein in your  neck or groin.  A catheter will be inserted into the needle and moved to your heart.  Dye may be injected through the catheter to help your surgeon see the area of the heart that needs treatment.  Electrical currents will be sent from the catheter to ablate heart tissue in desired areas. There are three types of energy that may be used to do this: ? Heat (radiofrequency energy). ? Laser energy. ? Extreme cold (cryoablation).  When the tissue has been ablated, the catheter will be removed.  Pressure will be held on the insertion area to prevent a lot of bleeding.  A bandage (dressing) will be placed over the insertion area. The exact procedure may vary among health care providers and hospitals.   What happens after the procedure?  Your blood pressure, heart rate, breathing rate, and blood oxygen level will be monitored until you leave the hospital or clinic.  Your insertion area will be monitored for bleeding. You will need to lie still for a few hours to ensure that you do not bleed from the insertion area.  Do not drive for 24 hours or as long as told by your health care provider. Summary  Cardiac ablation is a procedure to destroy, or ablate, a small amount of heart tissue using an electrical current. This procedure can improve the heart rhythm or return it to normal.  Tell your health care provider about any medical conditions you may have and all medicines you are taking to treat them.  This is a safe procedure, but problems may occur. Problems may include infection, bruising, damage to nearby organs or structures, or allergic reactions to medicines.  Follow your health care provider's instructions about eating and drinking before the procedure. You may also be told to change or stop some of your medicines.  After the procedure, do not drive for 24 hours or as long as told by your health care provider. This information is not intended to replace advice given to you by your  health care provider. Make sure you discuss any questions you have with your health care provider. Document Revised: 03/12/2019 Document Reviewed: 03/12/2019 Elsevier Patient Education  [place].

## 2020-09-10 ENCOUNTER — Other Ambulatory Visit: Payer: Self-pay

## 2020-09-10 ENCOUNTER — Other Ambulatory Visit: Payer: Self-pay | Admitting: Family

## 2020-09-10 ENCOUNTER — Other Ambulatory Visit: Payer: Self-pay | Admitting: Endocrinology

## 2020-09-10 DIAGNOSIS — E1169 Type 2 diabetes mellitus with other specified complication: Secondary | ICD-10-CM

## 2020-09-14 ENCOUNTER — Observation Stay (HOSPITAL_BASED_OUTPATIENT_CLINIC_OR_DEPARTMENT_OTHER)
Admission: EM | Admit: 2020-09-14 | Discharge: 2020-09-17 | Disposition: A | Discharge status: Home / Self Care | Payer: 59 | Attending: Internal Medicine | Admitting: Internal Medicine

## 2020-09-14 ENCOUNTER — Other Ambulatory Visit: Payer: Self-pay

## 2020-09-14 ENCOUNTER — Emergency Department (HOSPITAL_BASED_OUTPATIENT_CLINIC_OR_DEPARTMENT_OTHER): Payer: 59

## 2020-09-14 ENCOUNTER — Encounter (HOSPITAL_BASED_OUTPATIENT_CLINIC_OR_DEPARTMENT_OTHER): Payer: Self-pay | Admitting: *Deleted

## 2020-09-14 ENCOUNTER — Encounter: Payer: Self-pay | Admitting: Surgery

## 2020-09-14 DIAGNOSIS — K625 Hemorrhage of anus and rectum: Secondary | ICD-10-CM | POA: Insufficient documentation

## 2020-09-14 DIAGNOSIS — E118 Type 2 diabetes mellitus with unspecified complications: Secondary | ICD-10-CM | POA: Diagnosis present

## 2020-09-14 DIAGNOSIS — E872 Acidosis, unspecified: Secondary | ICD-10-CM

## 2020-09-14 DIAGNOSIS — K5904 Chronic idiopathic constipation: Secondary | ICD-10-CM | POA: Insufficient documentation

## 2020-09-14 DIAGNOSIS — E1169 Type 2 diabetes mellitus with other specified complication: Secondary | ICD-10-CM | POA: Diagnosis present

## 2020-09-14 DIAGNOSIS — K5903 Drug induced constipation: Secondary | ICD-10-CM | POA: Insufficient documentation

## 2020-09-14 DIAGNOSIS — K56600 Partial intestinal obstruction, unspecified as to cause: Secondary | ICD-10-CM

## 2020-09-14 DIAGNOSIS — I471 Supraventricular tachycardia, unspecified: Secondary | ICD-10-CM | POA: Diagnosis present

## 2020-09-14 DIAGNOSIS — Z20822 Contact with and (suspected) exposure to covid-19: Secondary | ICD-10-CM | POA: Diagnosis not present

## 2020-09-14 DIAGNOSIS — E119 Type 2 diabetes mellitus without complications: Secondary | ICD-10-CM | POA: Diagnosis present

## 2020-09-14 DIAGNOSIS — Z79899 Other long term (current) drug therapy: Secondary | ICD-10-CM | POA: Insufficient documentation

## 2020-09-14 DIAGNOSIS — R140 Abdominal distension (gaseous): Secondary | ICD-10-CM | POA: Insufficient documentation

## 2020-09-14 DIAGNOSIS — R1110 Vomiting, unspecified: Secondary | ICD-10-CM

## 2020-09-14 DIAGNOSIS — Z8719 Personal history of other diseases of the digestive system: Secondary | ICD-10-CM | POA: Diagnosis present

## 2020-09-14 DIAGNOSIS — Z7982 Long term (current) use of aspirin: Secondary | ICD-10-CM | POA: Insufficient documentation

## 2020-09-14 DIAGNOSIS — Z8616 Personal history of COVID-19: Secondary | ICD-10-CM | POA: Diagnosis not present

## 2020-09-14 DIAGNOSIS — F1190 Opioid use, unspecified, uncomplicated: Secondary | ICD-10-CM | POA: Insufficient documentation

## 2020-09-14 DIAGNOSIS — Z975 Presence of (intrauterine) contraceptive device: Secondary | ICD-10-CM | POA: Insufficient documentation

## 2020-09-14 DIAGNOSIS — R1084 Generalized abdominal pain: Secondary | ICD-10-CM | POA: Diagnosis present

## 2020-09-14 DIAGNOSIS — K56699 Other intestinal obstruction unspecified as to partial versus complete obstruction: Principal | ICD-10-CM | POA: Insufficient documentation

## 2020-09-14 DIAGNOSIS — E669 Obesity, unspecified: Secondary | ICD-10-CM | POA: Diagnosis present

## 2020-09-14 DIAGNOSIS — Z0189 Encounter for other specified special examinations: Secondary | ICD-10-CM

## 2020-09-14 DIAGNOSIS — E785 Hyperlipidemia, unspecified: Secondary | ICD-10-CM | POA: Diagnosis present

## 2020-09-14 DIAGNOSIS — K56609 Unspecified intestinal obstruction, unspecified as to partial versus complete obstruction: Secondary | ICD-10-CM

## 2020-09-14 DIAGNOSIS — I10 Essential (primary) hypertension: Secondary | ICD-10-CM | POA: Diagnosis not present

## 2020-09-14 DIAGNOSIS — D259 Leiomyoma of uterus, unspecified: Secondary | ICD-10-CM | POA: Insufficient documentation

## 2020-09-14 DIAGNOSIS — K562 Volvulus: Secondary | ICD-10-CM | POA: Diagnosis present

## 2020-09-14 HISTORY — DX: Abdominal distension (gaseous): R14.0

## 2020-09-14 LAB — CBC WITH DIFFERENTIAL/PLATELET
Abs Immature Granulocytes: 0.06 10*3/uL (ref 0.00–0.07)
Basophils Absolute: 0.1 10*3/uL (ref 0.0–0.1)
Basophils Relative: 0 %
Eosinophils Absolute: 0.1 10*3/uL (ref 0.0–0.5)
Eosinophils Relative: 1 %
HCT: 40.4 % (ref 36.0–46.0)
Hemoglobin: 13.4 g/dL (ref 12.0–15.0)
Immature Granulocytes: 1 %
Lymphocytes Relative: 52 %
Lymphs Abs: 6.4 10*3/uL — ABNORMAL HIGH (ref 0.7–4.0)
MCH: 29.3 pg (ref 26.0–34.0)
MCHC: 33.2 g/dL (ref 30.0–36.0)
MCV: 88.2 fL (ref 80.0–100.0)
Monocytes Absolute: 0.8 10*3/uL (ref 0.1–1.0)
Monocytes Relative: 7 %
Neutro Abs: 4.8 10*3/uL (ref 1.7–7.7)
Neutrophils Relative %: 39 %
Platelets: 515 10*3/uL — ABNORMAL HIGH (ref 150–400)
RBC: 4.58 MIL/uL (ref 3.87–5.11)
RDW: 13.8 % (ref 11.5–15.5)
WBC: 12.2 10*3/uL — ABNORMAL HIGH (ref 4.0–10.5)
nRBC: 0 % (ref 0.0–0.2)

## 2020-09-14 LAB — COMPREHENSIVE METABOLIC PANEL
ALT: 20 U/L (ref 0–44)
AST: 20 U/L (ref 15–41)
Albumin: 4 g/dL (ref 3.5–5.0)
Alkaline Phosphatase: 55 U/L (ref 38–126)
Anion gap: 13 (ref 5–15)
BUN: 17 mg/dL (ref 6–20)
CO2: 24 mmol/L (ref 22–32)
Calcium: 9.5 mg/dL (ref 8.9–10.3)
Chloride: 99 mmol/L (ref 98–111)
Creatinine, Ser: 0.88 mg/dL (ref 0.44–1.00)
GFR, Estimated: >60 mL/min (ref >60)
Glucose, Bld: 130 mg/dL — ABNORMAL HIGH (ref 70–99)
Potassium: 3.4 mmol/L — ABNORMAL LOW (ref 3.5–5.1)
Sodium: 136 mmol/L (ref 135–145)
Total Bilirubin: 0.2 mg/dL — ABNORMAL LOW (ref 0.3–1.2)
Total Protein: 8.7 g/dL — ABNORMAL HIGH (ref 6.5–8.1)

## 2020-09-14 LAB — MAGNESIUM: Magnesium: 2.4 mg/dL (ref 1.7–2.4)

## 2020-09-14 LAB — PHOSPHORUS: Phosphorus: 2.6 mg/dL (ref 2.5–4.6)

## 2020-09-14 LAB — LACTIC ACID, PLASMA: Lactic Acid, Venous: 2.3 mmol/L (ref 0.5–1.9)

## 2020-09-14 LAB — LIPASE, BLOOD: Lipase: 40 U/L (ref 11–51)

## 2020-09-14 MED ORDER — POTASSIUM CHLORIDE 10 MEQ/100ML IV SOLN
10.0000 meq | INTRAVENOUS | Status: AC
  Administered 2020-09-15 (×4): 10 meq via INTRAVENOUS
  Filled 2020-09-14 (×4): qty 100

## 2020-09-14 MED ORDER — ONDANSETRON HCL 4 MG/2ML IJ SOLN
INTRAMUSCULAR | Status: AC
  Administered 2020-09-14: 4 mg via INTRAVENOUS
  Filled 2020-09-14: qty 2

## 2020-09-14 MED ORDER — LIP MEDEX EX OINT
1.0000 "application " | TOPICAL_OINTMENT | Freq: Two times a day (BID) | CUTANEOUS | Status: DC
  Administered 2020-09-15 – 2020-09-17 (×5): 1 via TOPICAL
  Filled 2020-09-14: qty 7

## 2020-09-14 MED ORDER — PHENOL 1.4 % MT LIQD
2.0000 | OROMUCOSAL | Status: DC | PRN
  Filled 2020-09-14: qty 177

## 2020-09-14 MED ORDER — SIMETHICONE 40 MG/0.6ML PO SUSP
80.0000 mg | Freq: Four times a day (QID) | ORAL | Status: DC | PRN
  Filled 2020-09-14: qty 1.2

## 2020-09-14 MED ORDER — SODIUM CHLORIDE 0.9 % IV SOLN
8.0000 mg | Freq: Four times a day (QID) | INTRAVENOUS | Status: DC | PRN
  Filled 2020-09-14: qty 4

## 2020-09-14 MED ORDER — ONDANSETRON HCL 4 MG/2ML IJ SOLN
4.0000 mg | Freq: Once | INTRAMUSCULAR | Status: AC
  Administered 2020-09-14: 4 mg via INTRAVENOUS
  Filled 2020-09-14: qty 2

## 2020-09-14 MED ORDER — MENTHOL 3 MG MT LOZG
1.0000 | LOZENGE | OROMUCOSAL | Status: DC | PRN
  Filled 2020-09-14: qty 9

## 2020-09-14 MED ORDER — DIATRIZOATE MEGLUMINE & SODIUM 66-10 % PO SOLN
90.0000 mL | Freq: Once | ORAL | Status: DC
  Filled 2020-09-14: qty 90

## 2020-09-14 MED ORDER — ONDANSETRON HCL 4 MG/2ML IJ SOLN: 4.0000 mg | Freq: Four times a day (QID) | INTRAMUSCULAR | Status: DC | PRN

## 2020-09-14 MED ORDER — LACTATED RINGERS IV BOLUS
1000.0000 mL | Freq: Once | INTRAVENOUS | Status: AC
  Administered 2020-09-14: 1000 mL via INTRAVENOUS

## 2020-09-14 MED ORDER — ALUM & MAG HYDROXIDE-SIMETH 200-200-20 MG/5ML PO SUSP: 30.0000 mL | Freq: Four times a day (QID) | ORAL | Status: DC | PRN

## 2020-09-14 MED ORDER — PROCHLORPERAZINE EDISYLATE 10 MG/2ML IJ SOLN: 5.0000 mg | INTRAMUSCULAR | Status: DC | PRN

## 2020-09-14 MED ORDER — MORPHINE SULFATE (PF) 4 MG/ML IV SOLN
4.0000 mg | Freq: Once | INTRAVENOUS | Status: AC
  Administered 2020-09-14: 4 mg via INTRAVENOUS
  Filled 2020-09-14: qty 1

## 2020-09-14 MED ORDER — BISACODYL 10 MG RE SUPP: 10.0000 mg | Freq: Two times a day (BID) | RECTAL | Status: DC | PRN

## 2020-09-14 MED ORDER — MAGIC MOUTHWASH
15.0000 mL | Freq: Four times a day (QID) | ORAL | Status: DC | PRN
  Filled 2020-09-14: qty 15

## 2020-09-14 MED ORDER — HYDROMORPHONE HCL 1 MG/ML IJ SOLN
1.0000 mg | Freq: Once | INTRAMUSCULAR | Status: AC
Start: 2020-09-14 — End: 2020-09-14
  Administered 2020-09-14: 1 mg via INTRAVENOUS
  Filled 2020-09-14: qty 1

## 2020-09-14 MED ORDER — ONDANSETRON HCL 4 MG/2ML IJ SOLN: 4.0000 mg | Freq: Once | INTRAMUSCULAR | Status: AC

## 2020-09-14 MED ORDER — HYDROMORPHONE HCL 1 MG/ML IJ SOLN: 0.5000 mg | INTRAMUSCULAR | Status: DC | PRN

## 2020-09-14 MED ORDER — LACTATED RINGERS IV BOLUS: 1000.0000 mL | Freq: Three times a day (TID) | INTRAVENOUS | Status: AC | PRN

## 2020-09-14 MED ORDER — IOHEXOL 300 MG/ML  SOLN
100.0000 mL | Freq: Once | INTRAMUSCULAR | Status: AC
  Administered 2020-09-14: 100 mL via INTRAVENOUS

## 2020-09-14 MED ORDER — ACETAMINOPHEN 325 MG PO TABS: 325.0000 mg | ORAL_TABLET | Freq: Four times a day (QID) | ORAL | Status: DC | PRN

## 2020-09-14 MED ORDER — BISACODYL 10 MG RE SUPP
10.0000 mg | Freq: Every day | RECTAL | Status: DC
  Filled 2020-09-14 (×2): qty 1

## 2020-09-14 MED ORDER — LACTATED RINGERS IV BOLUS
1000.0000 mL | Freq: Once | INTRAVENOUS | Status: AC
  Administered 2020-09-15: 1000 mL via INTRAVENOUS

## 2020-09-14 NOTE — ED Notes (Signed)
Pt diaphoretic, writhing and screaming "help!" repeatedly as staff are working to get IV and medications.

## 2020-09-14 NOTE — ED Triage Notes (Signed)
Pt reports sharp abd pain since 2pm. She took tylenol but vomited. Hx of SBO. Reports normal BM today and has been passing gas

## 2020-09-14 NOTE — ED Notes (Signed)
ED Provider at bedside. 

## 2020-09-14 NOTE — ED Provider Notes (Signed)
[facility] EMERGENCY DEPARTMENT Provider Note   CSN: [id number] Arrival date & time: 09/14/20  2102     History Chief Complaint  Patient presents with  . Abdominal Pain    Susan Whitaker is a 50 y.o. female.   Abdominal Pain Pain location:  Generalized Pain quality: aching   Timing:  Constant Chronicity:  Recurrent Context comment:  Hx of SBO and feels like that Relieved by:  Nothing Worsened by:  Nothing Ineffective treatments:  None tried Associated symptoms: nausea and vomiting        Past Medical History:  Diagnosis Date  . Allergy    allergic rhinitis  . DM type 2 (diabetes mellitus, type 2) (HCC)   . FHx: hypertension   . Gastric ulcer   . GERD (gastroesophageal reflux disease)   . H/O constipation   . H/O hemorrhoids   . H/O varicella   . History of small bowel obstruction 06/02/2019  . Hyperlipidemia   . Hypertension   . Hypokalemia 2019  . Increased BMI 06/2010  . Menorrhagia   . Primary hyperaldosteronism (HCC) 02/21/2012  . SVT (supraventricular tachycardia) (HCC)    Noted 11/2011 admission  . Thrombocytopenia (HCC)   . Thyroid fullness 08/2005   monitoring no meds for followed by pcp    Patient Active Problem List   Diagnosis Date Noted  . Chronic idiopathic constipation 09/14/2020  . Abdominal bloating 09/14/2020  . Hemorrhage of rectum and anus 09/14/2020  . Obesity (BMI 30-39.9) 09/14/2020  . 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 Chronic narcotic use 09/14/2020  . Abnormal uterine bleeding due to adenomyosis 01/08/2020  . Polyp of corpus uteri 01/08/2020  . SBO (small bowel obstruction) (HCC) 06/02/2019  . History of small bowel obstruction 06/02/2019  . Vitamin D deficiency 05/26/2019  . Anxiety state 07/14/2012  . Primary hyperaldosteronism (HCC) 02/21/2012  . Microalbuminuria 02/15/2012  . Multiple thyroid nodules 01/04/2012  . SVT (supraventricular tachycardia) (HCC) 12/29/2011  . Hypokalemia 11/28/2011  . Cervical pain  (neck) 05/28/2011  . Hemorrhoids 02/07/2009  . Diabetes mellitus type 2 with complications (HCC) 02/07/2009  . ALLERGIC RHINITIS 05/24/2008  . Hyperlipemia 04/22/2008  . THROMBOCYTOSIS 04/22/2008  . URINARY INCONTINENCE 04/22/2008  . Essential hypertension 01/04/2007  . GERD 01/04/2007    Past Surgical History:  Procedure Laterality Date  . ABDOMINAL ADHESION SURGERY  06/03/2019   Dr Carolynne Edouard  . ABDOMINAL SURGERY     67 months of age-- unsure of type of surgery  . COLONOSCOPY  04/2016   hemorrhoids--normal per pt with Dr Loreta Ave  . DILATATION & CURETTAGE/HYSTEROSCOPY WITH MYOSURE N/A 02/08/2020   Procedure: DILATATION & CURETTAGE/HYSTEROSCOPY WITH MYOSURE;  Surgeon: Hoover Browns, MD;  Location: [facility];  Service: Gynecology;  Laterality: N/A;  . HEMORRHOID SURGERY  2005/2006  . INTRAUTERINE DEVICE (IUD) INSERTION N/A 02/08/2020   Procedure: INTRAUTERINE DEVICE (IUD) INSERTION UNDER ULTRASOUND GUIDANCE;  Surgeon: Hoover Browns, MD;  Location: [facility];  Service: Gynecology;  Laterality: N/A;  Mirena  . LAPAROTOMY N/A 06/03/2019   Procedure: EXPLORATORY LAPAROTOMY WITH LYSIS OF ADHESIONS;  Surgeon: Griselda Miner, MD;  Location: [facility];  Service: General;  Laterality: N/A;  . UTERINE FIBROID EMBOLIZATION  2010   at [facility]     OB History    Gravida  4   Para  2   Term  2   Preterm      AB  1   Living  2     SAB  1   IAB      Ectopic      Multiple      Live Births              Family History  Problem Relation Age of Onset  . Cancer Mother        breast  . Stroke Mother   . Cancer Maternal Grandmother        breast  . Hypertension Maternal Grandmother   . Cancer Other        breast, lung  . Hypertension Other   . Stroke Other   . Vision loss Father        some  . Heart disease Father        Rhythm disturbance  . Hypertension Sister   . Hypertension Brother   . Hypertension Brother   . Diabetes Neg Hx     Social History    Tobacco Use  . Smoking status: Never Smoker  . Smokeless tobacco: Never Used  Vaping Use  . Vaping Use: Never used  Substance Use Topics  . Alcohol use: No  . Drug use: No    Home Medications Prior to Admission medications   Medication Sig Start Date End Date Taking? Authorizing Provider  acetaminophen (TYLENOL) 500 MG tablet Take 2 tablets (1,000 mg total) by mouth every 6 (six) hours as needed for mild pain or headache. 06/11/19   Norm Parcel, PA-C  amLODipine (NORVASC) 10 MG tablet Take 1 tablet (10 mg total) by mouth daily. 06/28/20   Nche, Charlene Brooke, NP  aspirin EC 81 MG tablet Take 1 tablet (81 mg total) by mouth daily. 08/11/15   Debbrah Alar, NP  atorvastatin (LIPITOR) 40 MG tablet Take 1 tablet (40 mg total) by mouth daily. 04/29/20   Debbrah Alar, NP  docusate sodium (COLACE) 100 MG capsule Take 1 capsule (100 mg total) by mouth daily as needed for mild constipation. 06/11/19   Norm Parcel, PA-C  Dulaglutide (TRULICITY) 3 MH/9.6QI SOPN Inject 3 mg once a week 07/29/20   Elayne Snare, MD  glucose blood (ACCU-CHEK GUIDE) test strip 1 each by Other route 3 (three) times daily. Use as instructed to check once daily. 11/10/17   Elayne Snare, MD  HYDROcodone-acetaminophen (NORCO) 5-325 MG tablet Take 1 tablet by mouth every 4 (four) hours as needed for moderate pain. 06/25/77   Delora Fuel, MD  ibuprofen (ADVIL) 800 MG tablet Take 1 tablet (800 mg total) by mouth every 8 (eight) hours as needed for moderate pain or cramping. 02/08/20   Waymon Amato, MD  INVOKANA 300 MG TABS tablet TAKE 1 TABLET BY MOUTH ONCE DAILY BEFORE BREAKFAST (NEEDS  APPOINTMENT  FOR  MORE  REFILLS) 09/11/20   Elayne Snare, MD  losartan (COZAAR) 100 MG tablet Take 1 tablet (100 mg total) by mouth daily. 06/28/20   Nche, Charlene Brooke, NP  metoprolol tartrate (LOPRESSOR) 100 MG tablet Take 1 tablet (100 mg total) by mouth 2 (two) times daily. 06/10/20   Debbrah Alar, NP  ondansetron (ZOFRAN) 4  MG tablet Take 1 tablet (4 mg total) by mouth every 6 (six) hours as needed for nausea or vomiting. 12/24/19   Delora Fuel, MD  [facility] DELICA LANCETS FINE MISC Use to check blood sugar 3 times daily 09/28/16   Elayne Snare, MD  Polyethyl Glycol-Propyl Glycol (SYSTANE PRESERVATIVE FREE OP) Place 2 drops into both eyes 4 (four) times daily as needed (irritation, dryness).     [provider]  polyethylene glycol (MIRALAX / GLYCOLAX) 17 g packet Take 17 g by mouth daily as needed for mild constipation. 06/11/19   Norm Parcel, PA-C  spironolactone (ALDACTONE) 100 MG tablet Take 1 tablet by mouth once daily Patient taking differently: Take 100 mg by mouth daily. 03/10/20   Debbrah Alar, NP    Allergies    Lisinopril  Review of Systems   Review of Systems  Unable to perform ROS: Acuity of condition  Gastrointestinal: Positive for abdominal pain, nausea and vomiting.    Physical Exam Updated Vital Signs BP (!) 146/75   Pulse 89   Temp 98.9 F (37.2 C) (Oral)   Resp 14   Ht 5\' 3"  (1.6 m)   Wt 89.8 kg   LMP 07/15/2020   SpO2 96%   BMI 35.07 kg/m   Physical Exam Vitals and nursing note reviewed. Exam conducted with a chaperone present.  Constitutional:      General: She is not in acute distress.    Appearance: Normal appearance. She is ill-appearing and diaphoretic.  HENT:     Head: Normocephalic and atraumatic.     Nose: No rhinorrhea.  Eyes:     General:        Right eye: No discharge.        Left eye: No discharge.     Conjunctiva/sclera: Conjunctivae normal.  Cardiovascular:     Rate and Rhythm: Normal rate and regular rhythm.  Pulmonary:     Effort: Pulmonary effort is normal. No respiratory distress.     Breath sounds: No stridor.  Abdominal:     General: Abdomen is flat. There is no distension.     Palpations: Abdomen is soft.     Tenderness: There is generalized abdominal tenderness. There is guarding.  Musculoskeletal:        General: No  tenderness or signs of injury.  Skin:    General: Skin is warm.  Neurological:     General: No focal deficit present.     Mental Status: She is alert. Mental status is at baseline.     Motor: No weakness.  Psychiatric:        Mood and Affect: Mood normal.        Behavior: Behavior normal.     ED Results / Procedures / Treatments   Labs (all labs ordered are listed, but only abnormal results are displayed) Labs Reviewed  CBC WITH DIFFERENTIAL/PLATELET - Abnormal; Notable for the following components:      Result Value   WBC 12.2 (*)    Platelets 515 (*)    Lymphs Abs 6.4 (*)    All other components within normal limits  COMPREHENSIVE METABOLIC PANEL - Abnormal; Notable for the following components:   Potassium 3.4 (*)    Glucose, Bld 130 (*)    Total Protein 8.7 (*)    Total Bilirubin 0.2 (*)    All other components within normal limits  LACTIC ACID, PLASMA - Abnormal; Notable for the following components:   Lactic Acid, Venous 2.3 (*)    All other components within normal limits  SARS CORONAVIRUS 2 (TAT 6-24 HRS)  LIPASE, BLOOD  LACTIC ACID, PLASMA  MAGNESIUM  PHOSPHORUS  PREALBUMIN    EKG None  Radiology CT ABDOMEN PELVIS W CONTRAST  Result Date: 09/14/2020 CLINICAL DATA:  Abdominal pain and fever. EXAM: CT ABDOMEN AND PELVIS WITH CONTRAST TECHNIQUE: Multidetector CT imaging of the abdomen and pelvis was performed using the standard protocol following bolus administration  of intravenous contrast. CONTRAST:  132mL OMNIPAQUE IOHEXOL 300 MG/ML  SOLN COMPARISON:  June 21, 2020 FINDINGS: Lower chest: No acute abnormality. Hepatobiliary: No focal liver abnormality is seen. No gallstones, gallbladder wall thickening, or biliary dilatation. Pancreas: Unremarkable. No pancreatic ductal dilatation or surrounding inflammatory changes. Spleen: Normal in size without focal abnormality. Adrenals/Urinary Tract: Adrenal glands are unremarkable. Kidneys are normal, without renal  calculi, focal lesion, or hydronephrosis. Bladder is unremarkable. Stomach/Bowel: Stomach is within normal limits. The appendix is not clearly identified. Twisting of the mesentery is seen within the mid abdomen, to the right of midline (axial CT images 35 through 44, CT series number 2. Mildly prominent small bowel loops are also seen within this region with a transition zone seen extending into the previously noted area of twisted mesentery (axial CT images 39 through 45). Vascular/Lymphatic: Aortic atherosclerosis. No enlarged abdominal or pelvic lymph nodes. Reproductive: Partially calcified and non calcified uterine fibroids are again seen. An IUD is in place. The bilateral adnexa are unremarkable. Other: No abdominal wall hernia or abnormality. No abdominopelvic ascites. Musculoskeletal: No acute or significant osseous findings. IMPRESSION: Mesenteric volvulus within the mid abdomen with subsequent early small-bowel obstruction. Electronically Signed   By: Virgina Norfolk M.D.   On: 09/14/2020 23:14    Procedures Procedures   Medications Ordered in ED Medications  lactated ringers bolus 1,000 mL (has no administration in time range)  lactated ringers bolus 1,000 mL (has no administration in time range)  lactated ringers bolus 1,000 mL (has no administration in time range)  HYDROmorphone (DILAUDID) injection 0.5-2 mg (has no administration in time range)  acetaminophen (TYLENOL) tablet 325-650 mg (has no administration in time range)  ondansetron (ZOFRAN) injection 4 mg (has no administration in time range)    Or  ondansetron (ZOFRAN) 8 mg in sodium chloride 0.9 % 50 mL IVPB (has no administration in time range)  prochlorperazine (COMPAZINE) injection 5-10 mg (has no administration in time range)  lip balm (CARMEX) ointment 1 application (has no administration in time range)  phenol (CHLORASEPTIC) mouth spray 2 spray (has no administration in time range)  menthol-cetylpyridinium (CEPACOL)  lozenge 3 mg (has no administration in time range)  magic mouthwash (has no administration in time range)  alum & mag hydroxide-simeth (MAALOX/MYLANTA) 200-200-20 MG/5ML suspension 30 mL (has no administration in time range)  simethicone (MYLICON) 40 UJ/8.1XB suspension 80 mg (has no administration in time range)  bisacodyl (DULCOLAX) suppository 10 mg (has no administration in time range)  bisacodyl (DULCOLAX) suppository 10 mg (has no administration in time range)  diatrizoate meglumine-sodium (GASTROGRAFIN) 66-10 % solution 90 mL (has no administration in time range)  potassium chloride 10 mEq in 100 mL IVPB (has no administration in time range)  ondansetron (ZOFRAN) injection 4 mg (4 mg Intravenous Given 09/14/20 2139)  morphine 4 MG/ML injection 4 mg (4 mg Intravenous Given 09/14/20 2141)  lactated ringers bolus 1,000 mL (1,000 mLs Intravenous New Bag/Given 09/14/20 2246)  HYDROmorphone (DILAUDID) injection 1 mg (1 mg Intravenous Given 09/14/20 2156)  iohexol (OMNIPAQUE) 300 MG/ML solution 100 mL (100 mLs Intravenous Contrast Given 09/14/20 2212)  ondansetron (ZOFRAN) injection 4 mg (4 mg Intravenous Given 09/14/20 2238)    ED Course  I have reviewed the triage vital signs and the nursing notes.  Pertinent labs & imaging results that were available during my care of the patient were reviewed by me and considered in my medical decision making (see chart for details).    MDM Rules/Calculators/A&P  Patient is having severe abdominal pain sudden onset.  History of surgical abdomen.  Will give pain control, is hemodynamically stable but diaphoretic.  Will get antiemetics fluids and screening lab studies patient needs emergent imaging.  Labs show lactic acidosis, mild leukocytosis otherwise unremarkable.  CT imaging read by radiology myself shows a possible volvulus with early partial small bowel obstruction.  Patient is given volume resuscitation again in addition to the 1 L  already had.  She is much more comfortable now.  General surgery is consulted.  They recommend NG tube placement medicine admission and further evaluation.  Final Clinical Impression(s) / ED Diagnoses Final diagnoses:  Partial small bowel obstruction (HCC)  Vomiting in adult  Lactic acid acidosis    Rx / DC Orders ED Discharge Orders    None       Breck Coons, MD 09/14/20 2348

## 2020-09-14 NOTE — ED Notes (Signed)
Patient transported to CT 

## 2020-09-14 NOTE — ED Notes (Signed)
O2 sats 88-90% on RA; pt alert, but frequently falling asleep; O2 2L [place] applied.

## 2020-09-15 ENCOUNTER — Encounter (HOSPITAL_COMMUNITY): Payer: Self-pay | Admitting: Internal Medicine

## 2020-09-15 DIAGNOSIS — K56609 Unspecified intestinal obstruction, unspecified as to partial versus complete obstruction: Secondary | ICD-10-CM | POA: Diagnosis not present

## 2020-09-15 DIAGNOSIS — K562 Volvulus: Secondary | ICD-10-CM

## 2020-09-15 DIAGNOSIS — Z79899 Other long term (current) drug therapy: Secondary | ICD-10-CM | POA: Diagnosis not present

## 2020-09-15 DIAGNOSIS — R1084 Generalized abdominal pain: Secondary | ICD-10-CM | POA: Diagnosis present

## 2020-09-15 DIAGNOSIS — Z8616 Personal history of COVID-19: Secondary | ICD-10-CM | POA: Diagnosis not present

## 2020-09-15 DIAGNOSIS — E119 Type 2 diabetes mellitus without complications: Secondary | ICD-10-CM | POA: Diagnosis not present

## 2020-09-15 DIAGNOSIS — K56699 Other intestinal obstruction unspecified as to partial versus complete obstruction: Secondary | ICD-10-CM | POA: Diagnosis not present

## 2020-09-15 DIAGNOSIS — I10 Essential (primary) hypertension: Secondary | ICD-10-CM | POA: Diagnosis not present

## 2020-09-15 DIAGNOSIS — Z20822 Contact with and (suspected) exposure to covid-19: Secondary | ICD-10-CM | POA: Diagnosis not present

## 2020-09-15 DIAGNOSIS — Z7982 Long term (current) use of aspirin: Secondary | ICD-10-CM | POA: Diagnosis not present

## 2020-09-15 HISTORY — DX: Volvulus: K56.2

## 2020-09-15 LAB — LACTIC ACID, PLASMA
Lactic Acid, Venous: 2 mmol/L (ref 0.5–1.9)
Lactic Acid, Venous: 0.9 mmol/L (ref 0.5–1.9)
Lactic Acid, Venous: 1.3 mmol/L (ref 0.5–1.9)

## 2020-09-15 LAB — PREALBUMIN: Prealbumin: 31.9 mg/dL (ref 18–38)

## 2020-09-15 LAB — SARS CORONAVIRUS 2 (TAT 6-24 HRS): SARS Coronavirus 2: NEGATIVE

## 2020-09-15 LAB — GLUCOSE, CAPILLARY
Glucose-Capillary: 79 mg/dL (ref 70–99)
Glucose-Capillary: 88 mg/dL (ref 70–99)

## 2020-09-15 LAB — HIV ANTIBODY (ROUTINE TESTING W REFLEX): HIV Screen 4th Generation wRfx: NONREACTIVE

## 2020-09-15 MED ORDER — ATORVASTATIN CALCIUM 40 MG PO TABS
40.0000 mg | ORAL_TABLET | Freq: Every day | ORAL | Status: DC
  Administered 2020-09-15 – 2020-09-17 (×3): 40 mg via ORAL
  Filled 2020-09-15 (×3): qty 1

## 2020-09-15 MED ORDER — POLYVINYL ALCOHOL 1.4 % OP SOLN: 2.0000 [drp] | Freq: Four times a day (QID) | OPHTHALMIC | Status: DC | PRN

## 2020-09-15 MED ORDER — HYDRALAZINE HCL 20 MG/ML IJ SOLN: 10.0000 mg | INTRAMUSCULAR | Status: DC | PRN

## 2020-09-15 MED ORDER — LOSARTAN POTASSIUM 50 MG PO TABS
100.0000 mg | ORAL_TABLET | Freq: Every day | ORAL | Status: DC
  Administered 2020-09-15 – 2020-09-16 (×2): 100 mg via ORAL
  Filled 2020-09-15 (×2): qty 2

## 2020-09-15 MED ORDER — LACTATED RINGERS IV SOLN: INTRAVENOUS | Status: DC

## 2020-09-15 MED ORDER — METOPROLOL TARTRATE 100 MG PO TABS
100.0000 mg | ORAL_TABLET | Freq: Two times a day (BID) | ORAL | Status: DC
  Administered 2020-09-15 – 2020-09-17 (×4): 100 mg via ORAL
  Filled 2020-09-15 (×5): qty 1

## 2020-09-15 MED ORDER — AMLODIPINE BESYLATE 10 MG PO TABS
10.0000 mg | ORAL_TABLET | Freq: Every day | ORAL | Status: DC
  Administered 2020-09-15 – 2020-09-16 (×2): 10 mg via ORAL
  Filled 2020-09-15 (×2): qty 1

## 2020-09-15 MED ORDER — INSULIN ASPART 100 UNIT/ML IJ SOLN
0.0000 [IU] | Freq: Three times a day (TID) | INTRAMUSCULAR | Status: DC
  Administered 2020-09-16: 2 [IU] via SUBCUTANEOUS

## 2020-09-15 MED ORDER — INSULIN ASPART 100 UNIT/ML IJ SOLN: 0.0000 [IU] | Freq: Every day | INTRAMUSCULAR | Status: DC

## 2020-09-15 NOTE — Progress Notes (Signed)
Pt roomed to unit at 0900hrs. Has no NGT in place as she declined it at [facility]

## 2020-09-15 NOTE — ED Notes (Signed)
Approached patient regarding gastric tube insertion, explained rationale to pt and family at bedside. Pt has had NG prior to this admission, knowledgeable of procedure. Pt continues to decline NG tube at this time despite education from this RN.

## 2020-09-15 NOTE — Progress Notes (Signed)
50 yo f with h/o HTN, DM2, SVT scheduled for ablation  on 5/20, coming with sudden abd pain with dual hires is and tachycardia found to have mid abdominal mesenteric volvulus with early partial SBO. LA 2.3 with WBCs 12.2 , K 3.4. Dr Neysa Bonito with Surgery was notified and is aware about the patient. NG tune was recommended. Accepted to Tele Med bed at [facility] from [facility]. No beds at [facility].

## 2020-09-15 NOTE — ED Notes (Signed)
Pt resting comfortably at this time, pain significantly improved. Denies nausea, no vomiting presently. Discussed MD order for NG tube. Pt requested to delay insertion until transfer to hospital if possible. Discussed with Dr. Ron Parker and will re-eval patient in 30 minutes

## 2020-09-15 NOTE — Consult Note (Signed)
Consult Note  EMELEE RODOCKER 13-Oct-1970  [id number].    Requesting MD: Karmen Bongo Chief Complaint/Reason for Consult: possible mesenteric volvulus   HPI:  Patient is a 50 year old female who presented to [facility] yesterday with sudden onset severe abdominal pain. Pain started around 2 PM and progressively worsened. Pain characterized as sharp and generalized in central abdomen. Pain did not resolve until after multiple doses of pain medication and felt similar to when she had initial SBO. She vomited right after getting pain medication but reports she had not been vomiting at home. She had a large BM after CT that was formed and non-bloody. She reports she has had intermittent episodes of similar pain 3-4 other times, the most recent was in February. She has been seen by Dr. Collene Mares with GI who started her on a probiotic about 6 weeks ago. She has 1-2 BMs daily. She does not report any large fluctuations in weight. She was last seen by Dr. Marlou Starks in our office 07/08/20 who referred her back to GI.   PMH otherwise significant for SVT (she is scheduled for ablation 10/03/20), primary hyperaldosteronism, HTN, HLD, GERD, obesity. Recent hx of COVID in January as well. Past abdominal surgery includes exploratory laparotomy with LOA in January of last year and unknown abdominal surgery at 17 months old. She has also had some previous GYN surgeries. She denies alcohol, tobacco or illicit drug use.  ROS: Review of Systems  Constitutional: Negative for chills, fever, malaise/fatigue and weight loss.  Respiratory: Negative for shortness of breath and wheezing.   Cardiovascular: Negative for chest pain, palpitations and leg swelling.  Gastrointestinal: Positive for abdominal pain, nausea and vomiting. Negative for blood in stool, constipation, diarrhea and melena.  Genitourinary: Negative for dysuria, frequency and urgency.  All other systems reviewed and are negative.   Family History  Problem Relation  Age of Onset  . Cancer Mother        breast  . Stroke Mother   . Cancer Maternal Grandmother        breast  . Hypertension Maternal Grandmother   . Cancer Other        breast, lung  . Hypertension Other   . Stroke Other   . Vision loss Father        some  . Heart disease Father        Rhythm disturbance  . Hypertension Sister   . Hypertension Brother   . Hypertension Brother   . Diabetes Neg Hx     Past Medical History:  Diagnosis Date  . Class 2 obesity due to excess calories with body mass index (BMI) of 35.0 to 35.9 in adult 06/2010  . DM type 2 (diabetes mellitus, type 2) ([place])   . Gastric ulcer   . GERD (gastroesophageal reflux disease)   . H/O constipation   . H/O hemorrhoids   . History of small bowel obstruction 06/02/2019  . Hyperlipidemia   . Hypertension   . Hypokalemia 2019  . Menorrhagia   . Primary hyperaldosteronism ([place]) 02/21/2012  . SVT (supraventricular tachycardia) ([place])    Noted 11/2011 admission  . Thrombocytopenia ([place])     Past Surgical History:  Procedure Laterality Date  . ABDOMINAL ADHESION SURGERY  06/03/2019   Dr Marlou Starks  . ABDOMINAL SURGERY     1 months of age-- unsure of type of surgery  . COLONOSCOPY  04/2016   hemorrhoids--normal per pt with Dr Collene Mares  . DILATATION & CURETTAGE/HYSTEROSCOPY  WITH MYOSURE N/A 02/08/2020   Procedure: DILATATION & CURETTAGE/HYSTEROSCOPY WITH MYOSURE;  Surgeon: Waymon Amato, MD;  Location: [facility];  Service: Gynecology;  Laterality: N/A;  . HEMORRHOID SURGERY  2005/2006  . INTRAUTERINE DEVICE (IUD) INSERTION N/A 02/08/2020   Procedure: INTRAUTERINE DEVICE (IUD) INSERTION UNDER ULTRASOUND GUIDANCE;  Surgeon: Waymon Amato, MD;  Location: [facility];  Service: Gynecology;  Laterality: N/A;  Mirena  . LAPAROTOMY N/A 06/03/2019   Procedure: EXPLORATORY LAPAROTOMY WITH LYSIS OF ADHESIONS;  Surgeon: Jovita Kussmaul, MD;  Location: [facility];  Service: General;  Laterality: N/A;  . UTERINE  FIBROID EMBOLIZATION  2010   at [place] History:  reports that she has never smoked. She has never used smokeless tobacco. She reports that she does not drink alcohol and does not use drugs.  Allergies:  Allergies  Allergen Reactions  . Lisinopril Cough    Medications Prior to Admission  Medication Sig Dispense Refill  . amLODipine (NORVASC) 10 MG tablet Take 1 tablet (10 mg total) by mouth daily. 90 tablet 3  . aspirin EC 81 MG tablet Take 1 tablet (81 mg total) by mouth daily.    Marland Kitchen atorvastatin (LIPITOR) 40 MG tablet Take 1 tablet (40 mg total) by mouth daily. 90 tablet 1  . Dulaglutide (TRULICITY) 3 DG/3.8VF SOPN Inject 3 mg once a week (Patient taking differently: Inject 3 mg into the skin once a week.) 4 mL 2  . INVOKANA 300 MG TABS tablet TAKE 1 TABLET BY MOUTH ONCE DAILY BEFORE BREAKFAST (NEEDS  APPOINTMENT  FOR  MORE  REFILLS) (Patient taking differently: Take 300 mg by mouth daily before breakfast.) 30 tablet 0  . losartan (COZAAR) 100 MG tablet Take 1 tablet (100 mg total) by mouth daily. 90 tablet 3  . metoprolol tartrate (LOPRESSOR) 100 MG tablet Take 1 tablet (100 mg total) by mouth 2 (two) times daily. 180 tablet 1  . saccharomyces boulardii (FLORASTOR) 250 MG capsule Take 250 mg by mouth daily.    Marland Kitchen spironolactone (ALDACTONE) 100 MG tablet Take 1 tablet by mouth once daily (Patient taking differently: Take 100 mg by mouth daily.) 90 tablet 0  . glucose blood (ACCU-CHEK GUIDE) test strip 1 each by Other route 3 (three) times daily. Use as instructed to check once daily. 100 each 12  . HYDROcodone-acetaminophen (NORCO) 5-325 MG tablet Take 1 tablet by mouth every 4 (four) hours as needed for moderate pain. (Patient not taking: Reported on 09/15/2020) 6 tablet 0  . ondansetron (ZOFRAN) 4 MG tablet Take 1 tablet (4 mg total) by mouth every 6 (six) hours as needed for nausea or vomiting. (Patient not taking: Reported on 09/15/2020) 12 tablet 0  . ONETOUCH DELICA LANCETS FINE  MISC Use to check blood sugar 3 times daily 100 each 3    Blood pressure 116/68, pulse 81, temperature 98.4 F (36.9 C), temperature source Oral, resp. rate 18, height 5\' 3"  (1.6 m), weight 89.8 kg, last menstrual period 07/15/2020, SpO2 100 %. Physical Exam:  General: pleasant, WD, obese female who is laying in bed in NAD HEENT: head is normocephalic, atraumatic.  Sclera are noninjected. Ears and nose without any masses or lesions.  Mouth is pink and moist Heart: regular, rate, and rhythm.  Normal s1,s2. No obvious murmurs, gallops, or rubs noted.  Palpable radial and pedal pulses bilaterally Lungs: CTAB, no wheezes, rhonchi, or rales noted.  Respiratory effort nonlabored Abd: soft, NT, ND, +BS, midline surgical scar well  healing  MS: all 4 extremities are symmetrical with no cyanosis, clubbing, or edema. Skin: warm and dry with no masses, lesions, or rashes Neuro: Cranial nerves 2-12 grossly intact, sensation is normal throughout Psych: A&Ox3 with an appropriate affect.   Results for orders placed or performed during the hospital encounter of 09/14/20 (from the past 48 hour(s))  Magnesium     Status: None   Collection Time: 09/14/20  9:43 PM  Result Value Ref Range   Magnesium 2.4 1.7 - 2.4 mg/dL    Comment: Performed at [facility], [address] [place], [place] [address]  Phosphorus     Status: None   Collection Time: 09/14/20  9:43 PM  Result Value Ref Range   Phosphorus 2.6 2.5 - 4.6 mg/dL    Comment: Performed at [facility], [address] [place], [place] [address]  CBC with Differential     Status: Abnormal   Collection Time: 09/14/20  9:45 PM  Result Value Ref Range   WBC 12.2 (H) 4.0 - 10.5 K/uL   RBC 4.58 3.87 - 5.11 MIL/uL   Hemoglobin 13.4 12.0 - 15.0 g/dL   HCT 40.4 36.0 - 46.0 %   MCV 88.2 80.0 - 100.0 fL   MCH 29.3 26.0 - 34.0 pg   MCHC 33.2 30.0 - 36.0 g/dL   RDW 13.8 11.5 - 15.5 %   Platelets 515 (H) 150 - 400 K/uL   nRBC 0.0 0.0  - 0.2 %   Neutrophils Relative % 39 %   Neutro Abs 4.8 1.7 - 7.7 K/uL   Lymphocytes Relative 52 %   Lymphs Abs 6.4 (H) 0.7 - 4.0 K/uL   Monocytes Relative 7 %   Monocytes Absolute 0.8 0.1 - 1.0 K/uL   Eosinophils Relative 1 %   Eosinophils Absolute 0.1 0.0 - 0.5 K/uL   Basophils Relative 0 %   Basophils Absolute 0.1 0.0 - 0.1 K/uL   Immature Granulocytes 1 %   Abs Immature Granulocytes 0.06 0.00 - 0.07 K/uL    Comment: Performed at [facility], [address] [place], [place] [address]  Comprehensive metabolic panel     Status: Abnormal   Collection Time: 09/14/20  9:45 PM  Result Value Ref Range   Sodium 136 135 - 145 mmol/L   Potassium 3.4 (L) 3.5 - 5.1 mmol/L   Chloride 99 98 - 111 mmol/L   CO2 24 22 - 32 mmol/L   Glucose, Bld 130 (H) 70 - 99 mg/dL    Comment: Glucose reference range applies only to samples taken after fasting for at least 8 hours.   BUN 17 6 - 20 mg/dL   Creatinine, Ser 0.88 0.44 - 1.00 mg/dL   Calcium 9.5 8.9 - 10.3 mg/dL   Total Protein 8.7 (H) 6.5 - 8.1 g/dL   Albumin 4.0 3.5 - 5.0 g/dL   AST 20 15 - 41 U/L   ALT 20 0 - 44 U/L   Alkaline Phosphatase 55 38 - 126 U/L   Total Bilirubin 0.2 (L) 0.3 - 1.2 mg/dL   GFR, Estimated >60 >60 mL/min    Comment: (NOTE) Calculated using the CKD-EPI Creatinine Equation (2021)    Anion gap 13 5 - 15    Comment: Performed at [facility], [address] [place], [place] [address]  Lipase, blood     Status: None   Collection Time: 09/14/20  9:45 PM  Result Value Ref  Range   Lipase 40 11 - 51 U/L    Comment: Performed at [facility], [address] [place], [place] [address]  Lactic acid, plasma     Status: Abnormal   Collection Time: 09/14/20 10:43 PM  Result Value Ref Range   Lactic Acid, Venous 2.3 (HH) 0.5 - 1.9 mmol/L    Comment: CRITICAL RESULT CALLED TO, READ BACK BY AND VERIFIED WITH:  [facility] RN @[id number]  09/14/20 EDENSCA Performed at [facility], [address] [place], [place] [address]   Lactic acid, plasma     Status: Abnormal   Collection Time: 09/15/20 12:51 AM  Result Value Ref Range   Lactic Acid, Venous 2.0 (HH) 0.5 - 1.9 mmol/L    Comment: CRITICAL RESULT CALLED TO, READ BACK BY AND VERIFIED WITH: Lynann Beaver RN ON 09/15/20 AT 0142 [facility] Performed at [facility], [address] [place], [place] [address]   Prealbumin     Status: None   Collection Time: 09/15/20 12:51 AM  Result Value Ref Range   Prealbumin 31.9 18 - 38 mg/dL    Comment: Performed at [facility], [place] [address], [place], [place] [address]  Glucose, capillary     Status: None   Collection Time: 09/15/20 12:57 PM  Result Value Ref Range   Glucose-Capillary 79 70 - 99 mg/dL    Comment: Glucose reference range applies only to samples taken after fasting for at least 8 hours.   CT ABDOMEN PELVIS W CONTRAST  Result Date: 09/14/2020 CLINICAL DATA:  Abdominal pain and fever. EXAM: CT ABDOMEN AND PELVIS WITH CONTRAST TECHNIQUE: Multidetector CT imaging of the abdomen and pelvis was performed using the standard protocol following bolus administration of intravenous contrast. CONTRAST:  116mL OMNIPAQUE IOHEXOL 300 MG/ML  SOLN COMPARISON:  June 21, 2020 FINDINGS: Lower chest: No acute abnormality. Hepatobiliary: No focal liver abnormality is seen. No gallstones, gallbladder wall thickening, or biliary dilatation. Pancreas: Unremarkable. No pancreatic ductal dilatation or surrounding inflammatory changes. Spleen: Normal in size without focal abnormality. Adrenals/Urinary Tract: Adrenal glands are unremarkable. Kidneys are normal, without renal calculi, focal lesion, or hydronephrosis. Bladder is unremarkable. Stomach/Bowel: Stomach is within normal limits. The appendix is not clearly identified. Twisting of the mesentery is seen within the mid abdomen, to the right of midline (axial CT images 35 through 44, CT series number 2. Mildly prominent small bowel  loops are also seen within this region with a transition zone seen extending into the previously noted area of twisted mesentery (axial CT images 39 through 45). Vascular/Lymphatic: Aortic atherosclerosis. No enlarged abdominal or pelvic lymph nodes. Reproductive: Partially calcified and non calcified uterine fibroids are again seen. An IUD is in place. The bilateral adnexa are unremarkable. Other: No abdominal wall hernia or abnormality. No abdominopelvic ascites. Musculoskeletal: No acute or significant osseous findings. IMPRESSION: Mesenteric volvulus within the mid abdomen with subsequent early small-bowel obstruction. Electronically Signed   By: Virgina Norfolk M.D.   On: 09/14/2020 23:14      Assessment/Plan Hx of SVT HTN HLD T2DM Obesity class II - BMI 35.07 Uterine fibroids GERD Primary hyperaldosteronism   Possible mesenteric volvulus with SBO - Hx of exploratory laparotomy with LOA 06/03/19 Dr. Marlou Starks, Hx of unknown abdominal surgery at 82 months of age  - CT 5/1 read as above - patient clinically with complete resolution in symptoms and benign abdominal exam this AM - ok to have CLD  - will discuss further  with MD - may need further imaging or workup, may need dx laparoscopy if unable to tolerate diet advancement  - repeat AM labs  - surgery will follow but no indication for emergent/urgent surgical intervention  FEN: CLD, IVF VTE: SCDs ID: no current abx   Norm Parcel, [facility] 09/15/2020, 1:15 PM Please see Amion for pager number during day hours 7:00am-4:30pm

## 2020-09-15 NOTE — H&P (Signed)
History and Physical    Susan Whitaker XBM:[id number] DOB: [birth year] DOA: 09/14/2020  PCP: Flossie Buffy, NP Consultants:  Lovena Le- cardiology; Dwyane Dee - endocrinology; Alesia Richards - GYN; Collene Mares - GI Patient coming from:  Home - lives with daughter (53yo, [place]); NOK: Daughter, [phone]  Chief Complaint: Abdominal pain  HPI: Susan Whitaker is a 50 y.o. female with medical history significant of SVT (scheduled for ablation on 10/03/20); primary hyperaldosteronism; obesity; HTN; HLD; h/o SBO (2021; required ex lap with lysis of adhesions); and DM presenting to [facility] with abdominal pain.  About 2pm, she developed sharp abdominal -pains - felt like when she had her surgery in January.  She laid down with some improvement.  She got up later and the pain started back and the pain was even worse about 8pm.  She did not feel nausea, but she made herself vomit to try to ease the pain.  She also vomited x 3 after receiving pain medication, no further n/v.  Pain was periumbilical.  On a scale of 1-10, it was a 20 - worse than childbirth and worse than last time.  Her pain resolved yesterday after Dilaudid.  Her last BM was last night about 10-11pm and was solid and normal.    Her symptoms have completed resolved at this time.  She had COVID in January and had a sore throat for a few days.  She has no current symptoms of COVID infection.    ED Course:  [facility] transfer, per Dr. Sidney Ace:  50 yo f with h/o HTN, DM2, SVT scheduled for ablation  on 5/20, coming with sudden abd pain with tachycardia found to have mid abdominal mesenteric volvulus with early partial SBO. LA 2.3 with WBCs 12.2 , K 3.4. Dr Neysa Bonito with Surgery was notified and is aware about the patient. NG tube was recommended.   Review of Systems: As per HPI; otherwise review of systems reviewed and negative.   Ambulatory Status:  Ambulates without assistance  COVID Vaccine Status:  Complete  Past Medical History:  Diagnosis Date  .  Allergy    allergic rhinitis  . DM type 2 (diabetes mellitus, type 2) ([place])   . FHx: hypertension   . Gastric ulcer   . GERD (gastroesophageal reflux disease)   . H/O constipation   . H/O hemorrhoids   . H/O varicella   . History of small bowel obstruction 06/02/2019  . Hyperlipidemia   . Hypertension   . Hypokalemia 2019  . Increased BMI 06/2010  . Menorrhagia   . Primary hyperaldosteronism ([place]) 02/21/2012  . SVT (supraventricular tachycardia) ([place])    Noted 11/2011 admission  . Thrombocytopenia ([place])   . Thyroid fullness 08/2005   monitoring no meds for followed by pcp    Past Surgical History:  Procedure Laterality Date  . ABDOMINAL ADHESION SURGERY  06/03/2019   Dr Marlou Starks  . ABDOMINAL SURGERY     34 months of age-- unsure of type of surgery  . COLONOSCOPY  04/2016   hemorrhoids--normal per pt with Dr Collene Mares  . DILATATION & CURETTAGE/HYSTEROSCOPY WITH MYOSURE N/A 02/08/2020   Procedure: DILATATION & CURETTAGE/HYSTEROSCOPY WITH MYOSURE;  Surgeon: Waymon Amato, MD;  Location: [place];  Service: Gynecology;  Laterality: N/A;  . HEMORRHOID SURGERY  2005/2006  . INTRAUTERINE DEVICE (IUD) INSERTION N/A 02/08/2020   Procedure: INTRAUTERINE DEVICE (IUD) INSERTION UNDER ULTRASOUND GUIDANCE;  Surgeon: Waymon Amato, MD;  Location: [place];  Service: Gynecology;  Laterality: N/A;  Mirena  . LAPAROTOMY N/A 06/03/2019   Procedure: EXPLORATORY LAPAROTOMY WITH LYSIS OF ADHESIONS;  Surgeon: Jovita Kussmaul, MD;  Location: [facility];  Service: General;  Laterality: N/A;  . UTERINE FIBROID EMBOLIZATION  2010   at [place] History  . Marital status: Single    Spouse name: Not on file  . Number of children: 2  . Years of education: Not on file  . Highest education level: Not on file  Occupational History  . Occupation: YOUTH EMPLOYMENT    Employer: [employer]  Tobacco Use  . Smoking status: Never Smoker  . Smokeless tobacco: Never  Used  Vaping Use  . Vaping Use: Never used  Substance and Sexual Activity  . Alcohol use: No  . Drug use: No  . Sexual activity: Not on file  Other Topics Concern  . Not on file  Social History Narrative   Works at [employer] Strain: Not on [employer] Insecurity: Not on file  Transportation Needs: Not on file  Physical Activity: Not on file  Stress: Not on file  Social Connections: Not on file  Intimate Partner Violence: Not on file    Allergies  Allergen Reactions  . Lisinopril Cough    Family History  Problem Relation Age of Onset  . Cancer Mother        breast  . Stroke Mother   . Cancer Maternal Grandmother        breast  . Hypertension Maternal Grandmother   . Cancer Other        breast, lung  . Hypertension Other   . Stroke Other   . Vision loss Father        some  . Heart disease Father        Rhythm disturbance  . Hypertension Sister   . Hypertension Brother   . Hypertension Brother   . Diabetes Neg Hx     Prior to Admission medications   Medication Sig Start Date End Date Taking? Authorizing Provider  acetaminophen (TYLENOL) 500 MG tablet Take 2 tablets (1,000 mg total) by mouth every 6 (six) hours as needed for mild pain or headache. 06/11/19   Norm Parcel, PA-C  amLODipine (NORVASC) 10 MG tablet Take 1 tablet (10 mg total) by mouth daily. 06/28/20   Nche, Charlene Brooke, NP  aspirin EC 81 MG tablet Take 1 tablet (81 mg total) by mouth daily. 08/11/15   Debbrah Alar, NP  atorvastatin (LIPITOR) 40 MG tablet Take 1 tablet (40 mg total) by mouth daily. 04/29/20   Debbrah Alar, NP  docusate sodium (COLACE) 100 MG capsule Take 1 capsule (100 mg total) by mouth daily as needed for mild constipation. 06/11/19   Norm Parcel, PA-C  Dulaglutide (TRULICITY) 3 WU/9.8JX SOPN Inject 3 mg once a week 07/29/20   Elayne Snare, MD  glucose blood (ACCU-CHEK GUIDE) test strip 1 each  by Other route 3 (three) times daily. Use as instructed to check once daily. 11/10/17   Elayne Snare, MD  HYDROcodone-acetaminophen (NORCO) 5-325 MG tablet Take 1 tablet by mouth every 4 (four) hours as needed for moderate pain. 01/15/46   Delora Fuel, MD  ibuprofen (ADVIL) 800 MG tablet Take 1 tablet (800 mg total) by mouth every 8 (eight) hours as needed for moderate pain or cramping. 02/08/20   Waymon Amato, MD  [facility]  300 MG TABS tablet TAKE 1 TABLET BY MOUTH ONCE DAILY BEFORE BREAKFAST (NEEDS  APPOINTMENT  FOR  MORE  REFILLS) 09/11/20   Elayne Snare, MD  losartan (COZAAR) 100 MG tablet Take 1 tablet (100 mg total) by mouth daily. 06/28/20   Nche, Charlene Brooke, NP  metoprolol tartrate (LOPRESSOR) 100 MG tablet Take 1 tablet (100 mg total) by mouth 2 (two) times daily. 06/10/20   Debbrah Alar, NP  ondansetron (ZOFRAN) 4 MG tablet Take 1 tablet (4 mg total) by mouth every 6 (six) hours as needed for nausea or vomiting. [id number]   Delora Fuel, MD  [facility] DELICA LANCETS FINE MISC Use to check blood sugar 3 times daily 09/28/16   Elayne Snare, MD  Polyethyl Glycol-Propyl Glycol (SYSTANE PRESERVATIVE FREE OP) Place 2 drops into both eyes 4 (four) times daily as needed (irritation, dryness).     [provider]  polyethylene glycol (MIRALAX / GLYCOLAX) 17 g packet Take 17 g by mouth daily as needed for mild constipation. 06/11/19   Norm Parcel, PA-C  spironolactone (ALDACTONE) 100 MG tablet Take 1 tablet by mouth once daily Patient taking differently: Take 100 mg by mouth daily. 03/10/20   Debbrah Alar, NP    Physical Exam: Vitals:   09/15/20 0353 09/15/20 0500 09/15/20 0630 09/15/20 0700  BP: 138/77 125/69 130/70 123/67  Pulse: 94 75 76 75  Resp: 16  18 16   Temp:      TempSrc:      SpO2: 97% 97% 96% 96%  Weight:      Height:         . General:  Appears calm and comfortable and is in NAD . Eyes:   EOMI, normal lids, iris . ENT:  grossly normal hearing, lips & tongue,  mmm . Neck:  no LAD, masses or thyromegaly . Cardiovascular:  RRR, no m/r/g. No LE edema.  Marland Kitchen Respiratory:   CTA bilaterally with no wheezes/rales/rhonchi.  Normal respiratory effort. . Abdomen:  soft, NT, ND, NABS; well-healed midline surgical scar . Skin:  no rash or induration seen on limited exam . Musculoskeletal:  grossly normal tone BUE/BLE, good ROM, no bony abnormality . Psychiatric:  grossly normal mood and affect, speech fluent and appropriate, AOx3 . Neurologic:  CN 2-12 grossly intact, moves all extremities in coordinated fashion    Radiological Exams on Admission: Independently reviewed - see discussion in A/P where applicable  CT ABDOMEN PELVIS W CONTRAST  Result Date: 09/14/2020 CLINICAL DATA:  Abdominal pain and fever. EXAM: CT ABDOMEN AND PELVIS WITH CONTRAST TECHNIQUE: Multidetector CT imaging of the abdomen and pelvis was performed using the standard protocol following bolus administration of intravenous contrast. CONTRAST:  122mL OMNIPAQUE IOHEXOL 300 MG/ML  SOLN COMPARISON:  June 21, 2020 FINDINGS: Lower chest: No acute abnormality. Hepatobiliary: No focal liver abnormality is seen. No gallstones, gallbladder wall thickening, or biliary dilatation. Pancreas: Unremarkable. No pancreatic ductal dilatation or surrounding inflammatory changes. Spleen: Normal in size without focal abnormality. Adrenals/Urinary Tract: Adrenal glands are unremarkable. Kidneys are normal, without renal calculi, focal lesion, or hydronephrosis. Bladder is unremarkable. Stomach/Bowel: Stomach is within normal limits. The appendix is not clearly identified. Twisting of the mesentery is seen within the mid abdomen, to the right of midline (axial CT images 35 through 44, CT series number 2. Mildly prominent small bowel loops are also seen within this region with a transition zone seen extending into the previously noted area of twisted mesentery (axial CT images 39 through 45). Vascular/Lymphatic: Aortic  atherosclerosis. No enlarged abdominal or pelvic lymph nodes. Reproductive: Partially calcified and non calcified uterine fibroids are again seen. An IUD is in place. The bilateral adnexa are unremarkable. Other: No abdominal wall hernia or abnormality. No abdominopelvic ascites. Musculoskeletal: No acute or significant osseous findings. IMPRESSION: Mesenteric volvulus within the mid abdomen with subsequent early small-bowel obstruction. Electronically Signed   By: Virgina Norfolk M.D.   On: 09/14/2020 23:14    EKG: None   Labs on Admission: I have personally reviewed the available labs and imaging studies at the time of the admission.  Pertinent labs:   K+ 3.4 Glucose 130 Lactate 2.3, 2.0 WBC 12.2 Platelets 515 COVID pending   Assessment/Plan Principal Problem:   SBO (small bowel obstruction) (HCC) Active Problems:   Diabetes mellitus type 2 with complications (HCC)   SVT (supraventricular tachycardia) (HCC)   History of small bowel obstruction   Obesity (BMI 30-39.9)   Uterine fibromyoma   Volvulus (HCC)   SBO -Patient with prior h/o abdominal surgeries and prior SBO presenting with acute onset of abdominal pain with some n/v and CT findings c/w SBO due to volvulus -She was previously admitted in 05/2019 with SBO requiring NG tube placement and eventual ex lap with lysis of adhesions -By the time I saw her this time after transfer from [facility] [place], her symptoms have resolved; however given the possible instability of the volvulus, surgical intervention may still be needed -Will admit to Tele (see below) -Gen Surg consulted; currently no indication for surgical intervention -Per surgery, can start clear liquids and monitor for now -IVF hydration -Pain control with dilaudid -Hold ASA  SVT -She has known h/o episodic SVT and is planned for ablation on 5/20 -She can tell when she is symptomatic and so it is likely reasonable to d/c telemetry tomorrow if she does not d/c to  home -Continue Lopressor  HTN -Continue Norvasc, Cozaar, Lopressor -Hold Aldactone  HLD -Continue Lipitor  DM -Will check [id number] -hold Trulicity, Invokana -Cover with moderate-scale SSI  H/o COVID -Mild illness in January 2022 -COVID negative at this time  Obesity -Body mass index is 35.07 kg/m..  -Weight loss should be encouraged -Outpatient PCP/bariatric medicine/bariatric surgery f/u encouraged     Note: This patient has been tested and is negative for the novel coronavirus COVID-19. The patient has been fully vaccinated against COVID-19.   Level of care: Telemetry Medical DVT prophylaxis: SCDs Code Status:  Full - confirmed with patient/family Family Communication: Daughter was present throughout evaluation Disposition Plan:  The patient is from: home  Anticipated d/c is to: home without [facility] services  Anticipated d/c date will depend on clinical response to treatment, but possibly as early as tomorrow if she has excellent response to treatment  Patient is currently: acutely ill Consults called: Surgery Admission status:  Admit - It is my clinical opinion that admission to INPATIENT is reasonable and necessary because of the expectation that this patient will require hospital care that crosses at least 2 midnights to treat this condition based on the medical complexity of the problems presented.  Given the aforementioned information, the predictability of an adverse outcome is felt to be significant.    Karmen Bongo MD [facility]   How to contact the [facility] Attending or Consulting provider [place] or covering provider during after hours [place], for this patient?  1. Check the care team in [facility] and look for a) attending/consulting [facility] provider listed and b) the [facility] team listed 2. Log into  [email or web address] and use [place]'s universal password to access. If you do not have the password, please contact the hospital operator. 3. Locate the [facility] provider you are  looking for under [facility] and page to a number that you can be directly reached. 4. If you still have difficulty reaching the provider, please page the [facility] (Director on Call) for the Hospitalists listed on amion for assistance.   09/15/2020, 9:34 AM

## 2020-09-15 NOTE — ED Notes (Signed)
Pt aware of need for NG tube, pt declines at this time

## 2020-09-16 DIAGNOSIS — K56609 Unspecified intestinal obstruction, unspecified as to partial versus complete obstruction: Secondary | ICD-10-CM | POA: Diagnosis not present

## 2020-09-16 LAB — BASIC METABOLIC PANEL
Anion gap: 9 (ref 5–15)
BUN: 7 mg/dL (ref 6–20)
CO2: 29 mmol/L (ref 22–32)
Calcium: 8.9 mg/dL (ref 8.9–10.3)
Chloride: 99 mmol/L (ref 98–111)
Creatinine, Ser: 0.86 mg/dL (ref 0.44–1.00)
GFR, Estimated: >60 mL/min (ref >60)
Glucose, Bld: 85 mg/dL (ref 70–99)
Potassium: 4 mmol/L (ref 3.5–5.1)
Sodium: 137 mmol/L (ref 135–145)

## 2020-09-16 LAB — CBC
HCT: 37.7 % (ref 36.0–46.0)
Hemoglobin: 12.3 g/dL (ref 12.0–15.0)
MCH: 29.4 pg (ref 26.0–34.0)
MCHC: 32.6 g/dL (ref 30.0–36.0)
MCV: 90.2 fL (ref 80.0–100.0)
Platelets: 418 10*3/uL — ABNORMAL HIGH (ref 150–400)
RBC: 4.18 MIL/uL (ref 3.87–5.11)
RDW: 14 % (ref 11.5–15.5)
WBC: 7.3 10*3/uL (ref 4.0–10.5)
nRBC: 0 % (ref 0.0–0.2)

## 2020-09-16 LAB — GLUCOSE, CAPILLARY
Glucose-Capillary: 96 mg/dL (ref 70–99)
Glucose-Capillary: 91 mg/dL (ref 70–99)
Glucose-Capillary: 129 mg/dL — ABNORMAL HIGH (ref 70–99)
Glucose-Capillary: 80 mg/dL (ref 70–99)

## 2020-09-16 MED ORDER — ENOXAPARIN SODIUM 40 MG/0.4ML IJ SOSY
40.0000 mg | PREFILLED_SYRINGE | INTRAMUSCULAR | Status: DC
  Administered 2020-09-16: 40 mg via SUBCUTANEOUS
  Filled 2020-09-16: qty 0.4

## 2020-09-16 NOTE — Progress Notes (Signed)
PROGRESS NOTE    Susan Whitaker  XTG:[id number] DOB: 1971/04/08 DOA: 09/14/2020 PCP: Flossie Buffy, NP    Brief Narrative:  Susan Whitaker is a 50 y.o. female with medical history significant of SVT (scheduled for ablation on 10/03/20); primary hyperaldosteronism; obesity; HTN; HLD; h/o SBO (2021; required ex lap with lysis of adhesions); and DM presenting to [facility] with abdominal pain.ED Course:  [facility] to [facility] transfer, per Dr. Sidney Ace.She had COVID in January and had a sore throat for a few days.  She has no current symptoms of COVID infection.   CT of abdomen and pelvis revealed Mesenteric volvulus within the mid abdomen with subsequent early small-bowel obstruction.   Surgery was consulted. Today started on clears diet , no emergent surgry .   Consultants:   General surgery  Procedures:   Antimicrobials:       Subjective: Positive flatus, positive BM.less abdominal pain  Objective: Vitals:   09/15/20 1803 09/15/20 2055 09/16/20 0420 09/16/20 1344  BP: 112/67 136/73 118/73 119/72  Pulse: 72 68 75 81  Resp:  17 17 16   Temp: 98.9 F (37.2 C) 98.7 F (37.1 C) 98.2 F (36.8 C) 98.2 F (36.8 C)  TempSrc: Oral  Oral Oral  SpO2: 99% 99% 98% 99%  Weight:      Height:        Intake/Output Summary (Last 24 hours) at 09/16/2020 1642 Last data filed at 09/16/2020 1339 Gross per 24 hour  Intake 3110.58 ml  Output --  Net 3110.58 ml   Filed Weights   09/14/20 2117  Weight: 89.8 kg    Examination:  General exam: Appears calm and comfortable  Respiratory system: Clear to auscultation. Respiratory effort normal. Cardiovascular system: S1 & S2 heard, RRR. No JVD, murmurs, rubs, gallops or clicks.  Gastrointestinal system: Abdomen is nondistended, soft and nontender. Normal bowel sounds heard. Central nervous system: Alert and oriented. No focal neurological deficits. Extremities: no edema Psychiatry: Judgement and insight appear normal. Mood & affect appropriate.      Data Reviewed: I have personally reviewed following labs and imaging studies  CBC: Recent Labs  Lab 09/14/20 2145 09/16/20 0716  WBC 12.2* 7.3  NEUTROABS 4.8  --   HGB 13.4 12.3  HCT 40.4 37.7  MCV 88.2 90.2  PLT 515* 270*   Basic Metabolic Panel: Recent Labs  Lab 09/14/20 2143 09/14/20 2145 09/16/20 0716  NA  --  136 137  K  --  3.4* 4.0  CL  --  99 99  CO2  --  24 29  GLUCOSE  --  130* 85  BUN  --  17 7  CREATININE  --  0.88 0.86  CALCIUM  --  9.5 8.9  MG 2.4  --   --   PHOS 2.6  --   --    GFR: Estimated Creatinine Clearance: 82.3 mL/min (by C-G formula based on SCr of 0.86 mg/dL). Liver Function Tests: Recent Labs  Lab 09/14/20 2145  AST 20  ALT 20  ALKPHOS 55  BILITOT 0.2*  PROT 8.7*  ALBUMIN 4.0   Recent Labs  Lab 09/14/20 2145  LIPASE 40   No results for input(s): AMMONIA in the last 168 hours. Coagulation Profile: No results for input(s): INR, PROTIME in the last 168 hours. Cardiac Enzymes: No results for input(s): CKTOTAL, CKMB, CKMBINDEX, TROPONINI in the last 168 hours. BNP (last 3 results) No results for input(s): PROBNP in the last 8760 hours. HbA1C: No results for input(s): HGBA1C  in the last 72 hours. CBG: Recent Labs  Lab 09/15/20 1257 09/15/20 1747 09/16/20 0801 09/16/20 1154  GLUCAP 79 88 96 91   Lipid Profile: No results for input(s): CHOL, HDL, LDLCALC, TRIG, CHOLHDL, LDLDIRECT in the last 72 hours. Thyroid Function Tests: No results for input(s): TSH, T4TOTAL, FREET4, T3FREE, THYROIDAB in the last 72 hours. Anemia Panel: No results for input(s): VITAMINB12, FOLATE, FERRITIN, TIBC, IRON, RETICCTPCT in the last 72 hours. Sepsis Labs: Recent Labs  Lab 09/14/20 2243 09/15/20 0051 09/15/20 1450 09/15/20 1732  LATICACIDVEN 2.3* 2.0* 0.9 1.3    Recent Results (from the past 240 hour(s))  SARS CORONAVIRUS 2 (TAT 6-24 HRS) Nasopharyngeal Nasopharyngeal Swab     Status: None   Collection Time: 09/15/20 12:16 AM    Specimen: Nasopharyngeal Swab  Result Value Ref Range Status   SARS Coronavirus 2 NEGATIVE NEGATIVE Final    Comment: (NOTE) SARS-CoV-2 target nucleic acids are NOT DETECTED.  The SARS-CoV-2 RNA is generally detectable in upper and lower respiratory specimens during the acute phase of infection. Negative results do not preclude SARS-CoV-2 infection, do not rule out co-infections with other pathogens, and should not be used as the sole basis for treatment or other patient management decisions. Negative results must be combined with clinical observations, patient history, and epidemiological information. The expected result is Negative.  Fact Sheet for Patients: SugarRoll.be  Fact Sheet for Healthcare Providers: https://www.woods-mathews.com/  This test is not yet approved or cleared by the [place] FDA and  has been authorized for detection and/or diagnosis of SARS-CoV-2 by FDA under an Emergency Use Authorization (EUA). This EUA will remain  in effect (meaning this test can be used) for the duration of the COVID-19 declaration under Se ction 564(b)(1) of the Act, 21 U.S.C. section 360bbb-3(b)(1), unless the authorization is terminated or revoked sooner.  Performed at [facility], [place] [address], [place], [place] [address]          Radiology Studies: CT ABDOMEN PELVIS W CONTRAST  Result Date: 09/14/2020 CLINICAL DATA:  Abdominal pain and fever. EXAM: CT ABDOMEN AND PELVIS WITH CONTRAST TECHNIQUE: Multidetector CT imaging of the abdomen and pelvis was performed using the standard protocol following bolus administration of intravenous contrast. CONTRAST:  188mL OMNIPAQUE IOHEXOL 300 MG/ML  SOLN COMPARISON:  June 21, 2020 FINDINGS: Lower chest: No acute abnormality. Hepatobiliary: No focal liver abnormality is seen. No gallstones, gallbladder wall thickening, or biliary dilatation. Pancreas: Unremarkable. No pancreatic ductal  dilatation or surrounding inflammatory changes. Spleen: Normal in size without focal abnormality. Adrenals/Urinary Tract: Adrenal glands are unremarkable. Kidneys are normal, without renal calculi, focal lesion, or hydronephrosis. Bladder is unremarkable. Stomach/Bowel: Stomach is within normal limits. The appendix is not clearly identified. Twisting of the mesentery is seen within the mid abdomen, to the right of midline (axial CT images 35 through 44, CT series number 2. Mildly prominent small bowel loops are also seen within this region with a transition zone seen extending into the previously noted area of twisted mesentery (axial CT images 39 through 45). Vascular/Lymphatic: Aortic atherosclerosis. No enlarged abdominal or pelvic lymph nodes. Reproductive: Partially calcified and non calcified uterine fibroids are again seen. An IUD is in place. The bilateral adnexa are unremarkable. Other: No abdominal wall hernia or abnormality. No abdominopelvic ascites. Musculoskeletal: No acute or significant osseous findings. IMPRESSION: Mesenteric volvulus within the mid abdomen with subsequent early small-bowel obstruction. Electronically Signed   By: Virgina Norfolk M.D.   On: 09/14/2020 23:14  Scheduled Meds: . amLODipine  10 mg Oral Daily  . atorvastatin  40 mg Oral Daily  . bisacodyl  10 mg Rectal Daily  . insulin aspart  0-15 Units Subcutaneous TID WC  . insulin aspart  0-5 Units Subcutaneous QHS  . lip balm  1 application Topical BID  . losartan  100 mg Oral Daily  . metoprolol tartrate  100 mg Oral BID   Continuous Infusions: . lactated ringers    . lactated ringers 75 mL/hr at 09/16/20 0700  . ondansetron [facility]) IV      Assessment & Plan:   Principal Problem:   SBO (small bowel obstruction) (HCC) Active Problems:   Dyslipidemia   Essential hypertension   Diabetes mellitus type 2 with complications (HCC)   SVT (supraventricular tachycardia) (HCC)   Obesity (BMI 30-39.9)    Volvulus ([place])   Possible mesenteric volvulus with SBO -Patient with prior h/o abdominal surgeries and prior SBO presenting with acute onset of abdominal pain with some n/v and CT findings c/w SBO due to volvulus -She was previously admitted in 05/2019 with SBO requiring NG tube placement and eventual ex lap with lysis of adhesions Transfer from [facility] 5/3-lactic acid improved with ivf sx have resolved. Having flatus and BM. Surgery following. Continue ivf Start CLD, advance to GI soft as tolerated    SVT -She has known h/o episodic SVT and is planned for ablation on 5/20 5/3-continue beta blk   HTN Stable, Continue norvasc, Cozaar, lopressor Hold aldactone   HLD Continue statin  DM BG stable  check D9I -hold Trulicity, Invokana RISS  H/o COVID -Mild illness in January 2022 -COVID negative at this time  Obesity -Body mass index is 35.07 kg/m..  -Weight loss should be encouraged     DVT prophylaxis: Lovenox Code Status: Full Family Communication: Family at bedside  Status is: Observation  As patient requires <2 [place] stays.    Dispo: The patient is from: Home              Anticipated d/c is to: Home              Patient currently is not medically stable to d/c.   Difficult to place patient No            LOS: 1 day   Time spent: 35 minutes with more than 50% COC    Nolberto Hanlon, MD [facility] Pager [phone]  If 7PM-7AM, please contact night-coverage 09/16/2020, 4:42 PM

## 2020-09-16 NOTE — Progress Notes (Signed)
Progress Note     Subjective: CC: no further abdominal pain. BM last night. Tolerating clears without nausea/emesis. No respiratory complaints   Objective: Vital signs in last 24 hours: Temp:  [98.2 F (36.8 C)-98.9 F (37.2 C)] 98.2 F (36.8 C) (05/03 0420) Pulse Rate:  [68-75] 75 (05/03 0420) Resp:  [17] 17 (05/03 0420) BP: (112-136)/(64-73) 118/73 (05/03 0420) SpO2:  [96 %-99 %] 98 % (05/03 0420) Last BM Date: 09/15/20  Intake/Output from previous day: 05/02 0701 - 05/03 0700 In: 2350.6 [P.O.:480; I.V.:1870.6] Out: -  Intake/Output this shift: No intake/output data recorded.  PE: General: pleasant, WD, female who is laying in bed in NAD HEENT: head is normocephalic, atraumatic.  Sclera are noninjected..  Ears and nose without any masses or lesions.  Mouth is pink and moist Heart: regular, rate, and rhythm.  Normal s1,s2. No obvious murmurs, gallops, or rubs noted.  Palpable radial and pedal pulses bilaterally Lungs: CTAB, no wheezes, rhonchi, or rales noted.  Respiratory effort nonlabored Abd: soft, NT, ND, +BS, no masses, hernias, or organomegaly. Well healed midline surgical scar MS: all 4 extremities are symmetrical with no cyanosis, clubbing, or edema. Skin: warm and dry with no masses, lesions, or rashes Neuro: Cranial nerves 2-12 grossly intact, sensation is normal throughout Psych: A&Ox3 with an appropriate affect.    Lab Results:  Recent Labs    09/14/20 2145 09/16/20 0716  WBC 12.2* 7.3  HGB 13.4 12.3  HCT 40.4 37.7  PLT 515* 418*   BMET Recent Labs    09/14/20 2145 09/16/20 0716  NA 136 137  K 3.4* 4.0  CL 99 99  CO2 24 29  GLUCOSE 130* 85  BUN 17 7  CREATININE 0.88 0.86  CALCIUM 9.5 8.9   PT/INR No results for input(s): LABPROT, INR in the last 72 hours. CMP     Component Value Date/Time   NA 137 09/16/2020 0716   NA 138 08/21/2012 1002   K 4.0 09/16/2020 0716   K 3.5 08/21/2012 1002   CL 99 09/16/2020 0716   CL 102 08/21/2012  1002   CO2 29 09/16/2020 0716   CO2 28 08/21/2012 1002   GLUCOSE 85 09/16/2020 0716   GLUCOSE 132 (H) 08/21/2012 1002   BUN 7 09/16/2020 0716   BUN 16.7 08/21/2012 1002   CREATININE 0.86 09/16/2020 0716   CREATININE 0.83 12/03/2013 1112   CREATININE 1.0 08/21/2012 1002   CALCIUM 8.9 09/16/2020 0716   CALCIUM 8.9 08/21/2012 1002   PROT 8.7 (H) 09/14/2020 2145   PROT 7.6 08/21/2012 1002   ALBUMIN 4.0 09/14/2020 2145   ALBUMIN 3.3 (L) 08/21/2012 1002   AST 20 09/14/2020 2145   AST 10 08/21/2012 1002   ALT 20 09/14/2020 2145   ALT 8 08/21/2012 1002   ALKPHOS 55 09/14/2020 2145   ALKPHOS 60 08/21/2012 1002   BILITOT 0.2 (L) 09/14/2020 2145   BILITOT 0.22 08/21/2012 1002   GFRNONAA >60 09/16/2020 0716   GFRNONAA 86 12/03/2013 1112   GFRAA >60 02/05/2020 1017   GFRAA >89 12/03/2013 1112   Lipase     Component Value Date/Time   LIPASE 40 09/14/2020 2145       Studies/Results: CT ABDOMEN PELVIS W CONTRAST  Result Date: 09/14/2020 CLINICAL DATA:  Abdominal pain and fever. EXAM: CT ABDOMEN AND PELVIS WITH CONTRAST TECHNIQUE: Multidetector CT imaging of the abdomen and pelvis was performed using the standard protocol following bolus administration of intravenous contrast. CONTRAST:  153mL OMNIPAQUE IOHEXOL 300 MG/ML  SOLN COMPARISON:  June 21, 2020 FINDINGS: Lower chest: No acute abnormality. Hepatobiliary: No focal liver abnormality is seen. No gallstones, gallbladder wall thickening, or biliary dilatation. Pancreas: Unremarkable. No pancreatic ductal dilatation or surrounding inflammatory changes. Spleen: Normal in size without focal abnormality. Adrenals/Urinary Tract: Adrenal glands are unremarkable. Kidneys are normal, without renal calculi, focal lesion, or hydronephrosis. Bladder is unremarkable. Stomach/Bowel: Stomach is within normal limits. The appendix is not clearly identified. Twisting of the mesentery is seen within the mid abdomen, to the right of midline (axial CT  images 35 through 44, CT series number 2. Mildly prominent small bowel loops are also seen within this region with a transition zone seen extending into the previously noted area of twisted mesentery (axial CT images 39 through 45). Vascular/Lymphatic: Aortic atherosclerosis. No enlarged abdominal or pelvic lymph nodes. Reproductive: Partially calcified and non calcified uterine fibroids are again seen. An IUD is in place. The bilateral adnexa are unremarkable. Other: No abdominal wall hernia or abnormality. No abdominopelvic ascites. Musculoskeletal: No acute or significant osseous findings. IMPRESSION: Mesenteric volvulus within the mid abdomen with subsequent early small-bowel obstruction. Electronically Signed   By: Virgina Norfolk M.D.   On: 09/14/2020 23:14    Anti-infectives: Anti-infectives (From admission, onward)   None       Assessment/Plan  Hx of SVT HTN HLD T2DM Obesity class II - BMI 35.07 Uterine fibroids GERD Primary hyperaldosteronism   Possible mesenteric volvulus with SBO - Hx of exploratory laparotomy with LOA 06/03/19 Dr. Marlou Starks, Hx of unknown abdominal surgery at 20 months of age  - no return of symptoms. Abd exam benign. +flatus, +BM - leukocytosis resolved - full liquid diet and advance as tolerated to GI soft  FEN: FLD, IVF VTE: SCDs ID: no current abx    LOS: 1 day    Winferd Humphrey, [facility] Surgery 09/16/2020, 10:46 AM Please see Amion for pager number during day hours 7:00am-4:30pm

## 2020-09-17 ENCOUNTER — Encounter (HOSPITAL_COMMUNITY): Payer: Self-pay

## 2020-09-17 DIAGNOSIS — K56609 Unspecified intestinal obstruction, unspecified as to partial versus complete obstruction: Secondary | ICD-10-CM | POA: Diagnosis not present

## 2020-09-17 LAB — GLUCOSE, CAPILLARY: Glucose-Capillary: 99 mg/dL (ref 70–99)

## 2020-09-17 MED ORDER — AMLODIPINE BESYLATE 10 MG PO TABS
10.0000 mg | ORAL_TABLET | Freq: Every day | ORAL | Status: DC
  Administered 2020-09-17: 10 mg via ORAL
  Filled 2020-09-17: qty 1

## 2020-09-17 NOTE — Progress Notes (Signed)
Progress Note     Subjective: CC: Abdominal pain remains resolved on soft diet. No complaints.  Objective: Vital signs in last 24 hours: Temp:  [98.1 F (36.7 C)-98.6 F (37 C)] 98.1 F (36.7 C) (05/04 0546) Pulse Rate:  [67-81] 69 (05/04 0546) Resp:  [16-17] 17 (05/04 0546) BP: (107-119)/(67-74) 108/74 (05/04 0546) SpO2:  [96 %-99 %] 96 % (05/04 0546) Last BM Date: 09/16/20  Intake/Output from previous day: 05/03 0701 - 05/04 0700 In: 3179 [P.O.:1460; I.V.:1719] Out: -  Intake/Output this shift: Total I/O In: 240 [P.O.:240] Out: -   PE: General: pleasant, WD, female who is laying in bed in NAD HEENT: head is normocephalic, atraumatic.  Sclera are noninjected. Ears and nose without any masses or lesions.  Mouth is pink and moist Heart: regular, rate, and rhythm.  Normal s1,s2. No obvious murmurs, gallops, or rubs noted.  Palpable radial and pedal pulses bilaterally Lungs: CTAB, no wheezes, rhonchi, or rales noted.  Respiratory effort nonlabored Abd: soft, NT, ND, +BS, no masses, hernias, or organomegaly. Well healed midline surgical scar MS: all 4 extremities are symmetrical with no cyanosis, clubbing, or edema. Skin: warm and dry with no masses, lesions, or rashes Neuro: Cranial nerves 2-12 grossly intact, sensation is normal throughout Psych: A&Ox3 with an appropriate affect.    Lab Results:  Recent Labs    09/14/20 2145 09/16/20 0716  WBC 12.2* 7.3  HGB 13.4 12.3  HCT 40.4 37.7  PLT 515* 418*   BMET Recent Labs    09/14/20 2145 09/16/20 0716  NA 136 137  K 3.4* 4.0  CL 99 99  CO2 24 29  GLUCOSE 130* 85  BUN 17 7  CREATININE 0.88 0.86  CALCIUM 9.5 8.9   PT/INR No results for input(s): LABPROT, INR in the last 72 hours. CMP     Component Value Date/Time   NA 137 09/16/2020 0716   NA 138 08/21/2012 1002   K 4.0 09/16/2020 0716   K 3.5 08/21/2012 1002   CL 99 09/16/2020 0716   CL 102 08/21/2012 1002   CO2 29 09/16/2020 0716   CO2 28  08/21/2012 1002   GLUCOSE 85 09/16/2020 0716   GLUCOSE 132 (H) 08/21/2012 1002   BUN 7 09/16/2020 0716   BUN 16.7 08/21/2012 1002   CREATININE 0.86 09/16/2020 0716   CREATININE 0.83 12/03/2013 1112   CREATININE 1.0 08/21/2012 1002   CALCIUM 8.9 09/16/2020 0716   CALCIUM 8.9 08/21/2012 1002   PROT 8.7 (H) 09/14/2020 2145   PROT 7.6 08/21/2012 1002   ALBUMIN 4.0 09/14/2020 2145   ALBUMIN 3.3 (L) 08/21/2012 1002   AST 20 09/14/2020 2145   AST 10 08/21/2012 1002   ALT 20 09/14/2020 2145   ALT 8 08/21/2012 1002   ALKPHOS 55 09/14/2020 2145   ALKPHOS 60 08/21/2012 1002   BILITOT 0.2 (L) 09/14/2020 2145   BILITOT 0.22 08/21/2012 1002   GFRNONAA >60 09/16/2020 0716   GFRNONAA 86 12/03/2013 1112   GFRAA >60 02/05/2020 1017   GFRAA >89 12/03/2013 1112   Lipase     Component Value Date/Time   LIPASE 40 09/14/2020 2145       Studies/Results: No results found.  Anti-infectives: Anti-infectives (From admission, onward)   None       Assessment/Plan Hx of SVT HTN HLD T2DM Obesity class II - BMI 35.07 Uterine fibroids GERD Primary hyperaldosteronism   Possible mesenteric volvulus with SBO - Hx of exploratory laparotomy with LOA 06/03/19 Dr. Marlou Starks, Hx  of unknown abdominal surgery at 27 months of age  - no return of symptoms. Abd exam benign. +flatus, +BM - leukocytosis resolved  FEN: GI soft VTE: SCDs ID: no current abx   Disposition: okay to discharge from surgical stand point. She will follow up with Dr. Marlou Starks for monitoring and possible further outpatient work up   LOS: 1 day    Winferd Humphrey, [facility] Surgery 09/17/2020, 9:47 AM Please see Amion for pager number during day hours 7:00am-4:30pm

## 2020-09-17 NOTE — Discharge Summary (Signed)
Physician Discharge Summary  OLIVER NEUWIRTH DTO:[id number] DOB: [birth year] DOA: 09/14/2020  PCP: Flossie Buffy, NP  Admit date: 09/14/2020 Discharge date: 09/17/2020  Admitted From: Home  Disposition:  Home   Recommendations for Outpatient Follow-up:  1. Follow up with PCP in 1-2 weeks 2. Please obtain BMP/CBC in one week 3. Needs to follow up with Dr Marlou Starks for further evaluation of volvulus.  4.    Discharge Condition: Stable.  CODE STATUS: Full code Diet recommendation: Heart Healthy  Brief/Interim Summary: Susan Whitaker a 50 y.o.femalewith medical history significant ofSVT (scheduled for ablation on 10/03/20); primary hyperaldosteronism; obesity; HTN; HLD; h/o SBO (2021; required ex lap with lysis of adhesions); and DM presenting to [facility] with abdominal pain.ED Course:[facility] to [facility] transfer, per Dr. Sidney Ace.She had COVID in January and had a sore throat for a few days.She has no current symptoms of COVID infection.   CT of abdomen and pelvis revealed Mesenteric volvulus within the mid abdomen with subsequent early small-bowel obstruction.  Surgery consulted, patient Volvulus resolved spontaneously. She was able to tolerates diet.     Possible mesenteric volvulus with SBO -Patient with prior h/o abdominal surgeriesand prior SBOpresenting with acute onset of abdominal pain withsomen/v and CT findings c/w SBOdue to volvulus -She was previously admitted in 05/2019 with SBO requiring NG tube placement and eventual ex lap with lysis of adhesions -Presents with Volvulus. Surgery consulted. Symptoms resolved.  -patient was able to tolerate diet. Plan to resume Probiotics, high fiber diet.  -patient needs to follow up with Dr Marlou Starks for further evaluation.   SVT -She has known h/o episodic SVT and is planned for ablation on 5/20 5/3-continue beta blk   HTN Stable, Continue norvasc, Cozaar, lopressor Resume aldactone at discharge. Patient to monitor BP>     HLD Continue statin  DM -Resume Trulicity, Invokana at discharge   H/o COVID -Mild illness in January 2022 -COVID negative at this time  Obesity -Body mass index is 35.07 kg/m..  -Weight loss should be encouraged    Discharge Diagnoses:  Principal Problem:   SBO (small bowel obstruction) ([place]) Active Problems:   Dyslipidemia   Essential hypertension   Diabetes mellitus type 2 with complications (HCC)   SVT (supraventricular tachycardia) (HCC)   Obesity (BMI 30-39.9)   Volvulus ([place])    Discharge Instructions  Discharge Instructions    Diet - low sodium heart healthy   Complete by: As directed    Diet - low sodium heart healthy   Complete by: As directed    Increase activity slowly   Complete by: As directed    Increase activity slowly   Complete by: As directed      Allergies as of 09/17/2020      Reactions   Lisinopril Cough      Medication List    STOP taking these medications   HYDROcodone-acetaminophen 5-325 MG tablet Commonly known as: Norco   ondansetron 4 MG tablet Commonly known as: ZOFRAN     TAKE these medications   amLODipine 10 MG tablet Commonly known as: NORVASC Take 1 tablet (10 mg total) by mouth daily.   aspirin EC 81 MG tablet Take 1 tablet (81 mg total) by mouth daily.   atorvastatin 40 MG tablet Commonly known as: LIPITOR Take 1 tablet (40 mg total) by mouth daily.   glucose blood test strip Commonly known as: Accu-Chek Guide 1 each by Other route 3 (three) times daily. Use as instructed to check once daily.   Invokana  300 MG Tabs tablet Generic drug: canagliflozin TAKE 1 TABLET BY MOUTH ONCE DAILY BEFORE BREAKFAST (NEEDS  APPOINTMENT  FOR  MORE  REFILLS) What changed: See the new instructions.   losartan 100 MG tablet Commonly known as: COZAAR Take 1 tablet (100 mg total) by mouth daily.   metoprolol tartrate 100 MG tablet Commonly known as: LOPRESSOR Take 1 tablet (100 mg total) by mouth 2 (two)  times daily.   OneTouch Delica Lancets Fine Misc Use to check blood sugar 3 times daily   saccharomyces boulardii 250 MG capsule Commonly known as: FLORASTOR Take 250 mg by mouth daily.   spironolactone 100 MG tablet Commonly known as: ALDACTONE Take 1 tablet by mouth once daily   Trulicity 3 0000000 Sopn Generic drug: Dulaglutide Inject 3 mg once a week What changed:   how much to take  how to take this  when to take this  additional instructions       Follow-up Information    Nche, Charlene Brooke, NP Follow up in 1 week(s).   Specialty: Internal Medicine Contact information: [place] [address] [phone]        Jovita Kussmaul, MD. Call.   Specialty: General Surgery Why: call for an appointment in the next 3-4 weeks.  Contact information: [address] [place] [address] [phone]              Allergies  Allergen Reactions  . Lisinopril Cough    Consultations:  Surgery    Procedures/Studies: CT ABDOMEN PELVIS W CONTRAST  Result Date: 09/14/2020 CLINICAL DATA:  Abdominal pain and fever. EXAM: CT ABDOMEN AND PELVIS WITH CONTRAST TECHNIQUE: Multidetector CT imaging of the abdomen and pelvis was performed using the standard protocol following bolus administration of intravenous contrast. CONTRAST:  167mL OMNIPAQUE IOHEXOL 300 MG/ML  SOLN COMPARISON:  June 21, 2020 FINDINGS: Lower chest: No acute abnormality. Hepatobiliary: No focal liver abnormality is seen. No gallstones, gallbladder wall thickening, or biliary dilatation. Pancreas: Unremarkable. No pancreatic ductal dilatation or surrounding inflammatory changes. Spleen: Normal in size without focal abnormality. Adrenals/Urinary Tract: Adrenal glands are unremarkable. Kidneys are normal, without renal calculi, focal lesion, or hydronephrosis. Bladder is unremarkable. Stomach/Bowel: Stomach is within normal limits. The appendix is not clearly identified.  Twisting of the mesentery is seen within the mid abdomen, to the right of midline (axial CT images 35 through 44, CT series number 2. Mildly prominent small bowel loops are also seen within this region with a transition zone seen extending into the previously noted area of twisted mesentery (axial CT images 39 through 45). Vascular/Lymphatic: Aortic atherosclerosis. No enlarged abdominal or pelvic lymph nodes. Reproductive: Partially calcified and non calcified uterine fibroids are again seen. An IUD is in place. The bilateral adnexa are unremarkable. Other: No abdominal wall hernia or abnormality. No abdominopelvic ascites. Musculoskeletal: No acute or significant osseous findings. IMPRESSION: Mesenteric volvulus within the mid abdomen with subsequent early small-bowel obstruction. Electronically Signed   By: Virgina Norfolk M.D.   On: 09/14/2020 23:14     Subjective: Denies abdominal pain, had BM, tolerating diet.   Discharge Exam: Vitals:   09/16/20 2111 09/17/20 0546  BP: 107/67 108/74  Pulse: 67 69  Resp: 17 17  Temp: 98.6 F (37 C) 98.1 F (36.7 C)  SpO2: 98% 96%     General: Pt is alert, awake, not in acute distress Cardiovascular: RRR, S1/S2 +, no rubs, no gallops Respiratory: CTA bilaterally, no wheezing, no rhonchi  Abdominal: Soft, NT, ND, bowel sounds + Extremities: no edema, no cyanosis    The results of significant diagnostics from this hospitalization (including imaging, microbiology, ancillary and laboratory) are listed below for reference.     Microbiology: Recent Results (from the past 240 hour(s))  SARS CORONAVIRUS 2 (TAT 6-24 HRS) Nasopharyngeal Nasopharyngeal Swab     Status: None   Collection Time: 09/15/20 12:16 AM   Specimen: Nasopharyngeal Swab  Result Value Ref Range Status   SARS Coronavirus 2 NEGATIVE NEGATIVE Final    Comment: (NOTE) SARS-CoV-2 target nucleic acids are NOT DETECTED.  The SARS-CoV-2 RNA is generally detectable in upper and  lower respiratory specimens during the acute phase of infection. Negative results do not preclude SARS-CoV-2 infection, do not rule out co-infections with other pathogens, and should not be used as the sole basis for treatment or other patient management decisions. Negative results must be combined with clinical observations, patient history, and epidemiological information. The expected result is Negative.  Fact Sheet for Patients: SugarRoll.be  Fact Sheet for Healthcare Providers: https://www.woods-mathews.com/  This test is not yet approved or cleared by the [place] FDA and  has been authorized for detection and/or diagnosis of SARS-CoV-2 by FDA under an Emergency Use Authorization (EUA). This EUA will remain  in effect (meaning this test can be used) for the duration of the COVID-19 declaration under Se ction 564(b)(1) of the Act, 21 U.S.C. section 360bbb-3(b)(1), unless the authorization is terminated or revoked sooner.  Performed at [facility], [place] [address] [place], [place] [address]      Labs: BNP (last 3 results) No results for input(s): BNP in the last 8760 hours. Basic Metabolic Panel: Recent Labs  Lab 09/14/20 2143 09/14/20 2145 09/16/20 0716  NA  --  136 137  K  --  3.4* 4.0  CL  --  99 99  CO2  --  24 29  GLUCOSE  --  130* 85  BUN  --  17 7  CREATININE  --  0.88 0.86  CALCIUM  --  9.5 8.9  MG 2.4  --   --   PHOS 2.6  --   --    Liver Function Tests: Recent Labs  Lab 09/14/20 2145  AST 20  ALT 20  ALKPHOS 55  BILITOT 0.2*  PROT 8.7*  ALBUMIN 4.0   Recent Labs  Lab 09/14/20 2145  LIPASE 40   No results for input(s): AMMONIA in the last 168 hours. CBC: Recent Labs  Lab 09/14/20 2145 09/16/20 0716  WBC 12.2* 7.3  NEUTROABS 4.8  --   HGB 13.4 12.3  HCT 40.4 37.7  MCV 88.2 90.2  PLT 515* 418*   Cardiac Enzymes: No results for input(s): CKTOTAL, CKMB, CKMBINDEX, TROPONINI in  the last 168 hours. BNP: Invalid input(s): POCBNP CBG: Recent Labs  Lab 09/16/20 0801 09/16/20 1154 09/16/20 1706 09/16/20 2110 09/17/20 0747  GLUCAP 96 91 129* 80 99   D-Dimer No results for input(s): DDIMER in the last 72 hours. Hgb A1c No results for input(s): HGBA1C in the last 72 hours. Lipid Profile No results for input(s): CHOL, HDL, LDLCALC, TRIG, CHOLHDL, LDLDIRECT in the last 72 hours. Thyroid function studies No results for input(s): TSH, T4TOTAL, T3FREE, THYROIDAB in the last 72 hours.  Invalid input(s): FREET3 Anemia work up No results for input(s): VITAMINB12, FOLATE, FERRITIN, TIBC, IRON, RETICCTPCT in the last 72 hours. Urinalysis    Component Value Date/Time   COLORURINE STRAW (A) 06/21/2020 0300  APPEARANCEUR CLEAR 06/21/2020 0300   LABSPEC 1.015 06/21/2020 0300   PHURINE 7.5 06/21/2020 0300   GLUCOSEU >=500 (A) 06/21/2020 0300   GLUCOSEU >=1000 (A) 04/05/2019 1654   HGBUR NEGATIVE 06/21/2020 0300   HGBUR moderate 04/28/2010 1113   BILIRUBINUR NEGATIVE 06/21/2020 0300   BILIRUBINUR 0.2 11/10/2017 0937   KETONESUR NEGATIVE 06/21/2020 0300   PROTEINUR NEGATIVE 06/21/2020 0300   UROBILINOGEN 0.2 04/05/2019 1654   NITRITE NEGATIVE 06/21/2020 0300   LEUKOCYTESUR NEGATIVE 06/21/2020 0300   Sepsis Labs Invalid input(s): PROCALCITONIN,  WBC,  LACTICIDVEN Microbiology Recent Results (from the past 240 hour(s))  SARS CORONAVIRUS 2 (TAT 6-24 HRS) Nasopharyngeal Nasopharyngeal Swab     Status: None   Collection Time: 09/15/20 12:16 AM   Specimen: Nasopharyngeal Swab  Result Value Ref Range Status   SARS Coronavirus 2 NEGATIVE NEGATIVE Final    Comment: (NOTE) SARS-CoV-2 target nucleic acids are NOT DETECTED.  The SARS-CoV-2 RNA is generally detectable in upper and lower respiratory specimens during the acute phase of infection. Negative results do not preclude SARS-CoV-2 infection, do not rule out co-infections with other pathogens, and should not be  used as the sole basis for treatment or other patient management decisions. Negative results must be combined with clinical observations, patient history, and epidemiological information. The expected result is Negative.  Fact Sheet for Patients: SugarRoll.be  Fact Sheet for Healthcare Providers: https://www.woods-mathews.com/  This test is not yet approved or cleared by the [place] FDA and  has been authorized for detection and/or diagnosis of SARS-CoV-2 by FDA under an Emergency Use Authorization (EUA). This EUA will remain  in effect (meaning this test can be used) for the duration of the COVID-19 declaration under Se ction 564(b)(1) of the Act, 21 U.S.C. section 360bbb-3(b)(1), unless the authorization is terminated or revoked sooner.  Performed at [facility], [place] [address], [place], [place] [address]      Time coordinating discharge: 40 minutes  SIGNED:   Elmarie Shiley, MD  [facility]

## 2020-09-17 NOTE — Discharge Instructions (Signed)
Bowel Obstruction A bowel obstruction means that something is blocking the small or large bowel. The bowel is also called the intestine. It is the long tube that connects the stomach to the opening of the butt (anus). When something blocks the bowel, food and fluids cannot pass through like normal. This condition needs to be treated. Treatment depends on the cause of the problem and how bad the problem is. What are the causes? Common causes of this condition include:  Scar tissue (adhesions) from past surgery or from high-energy X-rays (radiation).  Recent surgery in the belly. This affects how food moves in the bowel.  Some diseases, such as: ? Irritation of the lining of the digestive tract (Crohn's disease). ? Irritation of small pouches in the bowel (diverticulitis).  Growths or tumors.  A bulging organ (hernia).  Twisting of the bowel (volvulus).  A foreign body.  Slipping of a part of the bowel into another part (intussusception).   What are the signs or symptoms? Symptoms of this condition include:  Pain in the belly.  Feeling sick to your stomach (nauseous).  Throwing up (vomiting).  Bloating in the belly.  Being unable to pass gas.  Trouble pooping (constipation).  Watery poop (diarrhea).  A lot of belching. How is this diagnosed? This condition may be diagnosed based on:  A physical exam.  Medical history.  Imaging tests, such as X-ray or CT scan.  Blood tests.  Urine tests. How is this treated? Treatment for this condition may include:  Fluids and pain medicines that are given through an IV tube. Your doctor may tell you not to eat or drink if you feel sick to your stomach and are throwing up.  Eating a clear liquid diet for a few days.  Putting a small tube (nasogastric tube) into the stomach. This will help with pain, discomfort, and nausea by removing blocked air and fluids from the stomach.  Surgery. This may be needed if other treatments do  not work. Follow these instructions at home: Medicines  Take over-the-counter and prescription medicines only as told by your doctor.  If you were prescribed an antibiotic medicine, take it as told by your doctor. Do not stop taking the antibiotic even if you start to feel better. General instructions  Follow your diet as told by your doctor. You may need to: ? Only drink clear liquids until you start to get better. ? Avoid solid foods.  Return to your normal activities as told by your doctor. Ask your doctor what activities are safe for you.  Do not sit for a long time without moving. Get up to take short walks every 1-2 hours. This is important. Ask for help if you feel weak or unsteady.  Keep all follow-up visits as told by your doctor. This is important. How is this prevented? After having a bowel obstruction, you may be more likely to have another. You can do some things to stop it from happening again.  If you have a long-term (chronic) disease, contact your doctor if you see changes or problems.  Take steps to prevent or treat trouble pooping. Your doctor may ask that you: ? Drink enough fluid to keep your pee (urine) pale yellow. ? Take over-the-counter or prescription medicines. ? Eat foods that are high in fiber. These include beans, whole grains, and fresh fruits and vegetables. ? Limit foods that are high in fat and sugar. These include fried or sweet foods.  Stay active. Ask your doctor which   exercises are safe for you.  Avoid stress.  Eat three small meals and three small snacks each day.  Work with a Publishing rights manager (dietitian) to make a meal plan that works for you.  Do not use any products that contain nicotine or tobacco, such as cigarettes and e-cigarettes. If you need help quitting, ask your doctor.   Contact a doctor if:  You have a fever.  You have chills. Get help right away if:  You have pain or cramps that get worse.  You throw up blood.  You are  sick to your stomach.  You cannot stop throwing up.  You cannot drink fluids.  You feel mixed up (confused).  You feel very thirsty (dehydrated).  Your belly gets more bloated.  You feel weak or you pass out (faint). Summary  A bowel obstruction means that something is blocking the small or large bowel.  Treatment may include IV fluids and pain medicine. You may also have a clear liquid diet, a small tube in your stomach, or surgery.  Drink clear liquids and avoid solid foods until you get better. This information is not intended to replace advice given to you by your health care provider. Make sure you discuss any questions you have with your health care provider. Document Revised: 09/14/2017 Document Reviewed: 09/14/2017 Elsevier Patient Education  [place] high fiber diet in 4 days.  Ok to take probiotics.  You need to follow up with Dr Marlou Starks.

## 2020-09-17 NOTE — Progress Notes (Incomplete)
[name] to be D/C'd per MD order. Discussed with the patient and all questions fully answered. ? VSS, Skin clean, dry and intact without evidence of skin break down, no evidence of skin tears noted. ? IV catheter discontinued intact. Site without signs and symptoms of complications. Dressing and pressure applied. ? An After Visit Summary was printed and given to the patient. Patient informed where to pickup prescriptions. ? D/c education completed with patient/family including follow up instructions, medication list, d/c activities limitations if indicated, with other d/c instructions as indicated by MD - patient able to verbalize understanding, all questions fully answered.  ? Patient instructed to return to ED, call 911, or call MD for any changes in condition.  ? Patient to be escorted via [place], and D/C home via private auto.

## 2020-09-18 ENCOUNTER — Telehealth: Payer: Self-pay

## 2020-09-18 NOTE — Telephone Encounter (Signed)
Transition Care Management Follow-up Telephone Call  Date of discharge and from where[facility]  How have you been since you were released from the hospital? Doing ok. Has some pain at IV site.She states IV infiltrated while she was in the hospital & IV was discontinued & new IV started at a different site. Denies redness or swelling. She states it is getting better but still a little sore. Patient to call the office if pain does not improve or worsens.  Any questions or concerns? No  Items Reviewed:  Did the pt receive and understand the discharge instructions provided? Yes   Medications obtained and verified? Yes   Other? Yes   Any new allergies since your discharge? No   Dietary orders reviewed? Yes  Do you have support at home? Yes   Home Care and Equipment/Supplies: Were home health services ordered? no If so, what is the name of the agency? n/a  Has the agency set up a time to come to the patient's home? not applicable Were any new equipment or medical supplies ordered?  No What is the name of the medical supply agency? n/a Were you able to get the supplies/equipment? not applicable Do you have any questions related to the use of the equipment or supplies? n/a  Functional Questionnaire: (I = Independent and D = Dependent) ADLs: I  Bathing/Dressing- I  Meal Prep- I  Eating- I  Maintaining continence- I  Transferring/Ambulation- I  Managing Meds- I  Follow up appointments reviewed:   PCP Hospital f/u appt confirmed? Yes  Scheduled to see Wilfred Lacy on 09/24/20 @ 11:00.  [facility] f/u appt confirmed? Yes  Scheduled to see Dr. Marlou Starks on 10/22/20 @ 1:40.  Are transportation arrangements needed? No   If their condition worsens, is the pt aware to call PCP or go to the Emergency Dept.? Yes  Was the patient provided with contact information for the PCP's office or ED? Yes  Was to pt encouraged to call back with questions or concerns?  Yes

## 2020-09-19 ENCOUNTER — Other Ambulatory Visit: Payer: 59

## 2020-09-22 ENCOUNTER — Ambulatory Visit: Payer: 59 | Admitting: Endocrinology

## 2020-09-22 ENCOUNTER — Telehealth: Payer: Self-pay | Admitting: Internal Medicine

## 2020-09-22 NOTE — Telephone Encounter (Signed)
Replied via mychart.  No action needed

## 2020-09-22 NOTE — Telephone Encounter (Signed)
Patient calling to speak with Dr. Tanna Furry nurse. States it is in regards to a [employer] she sent this morning.

## 2020-09-22 NOTE — Telephone Encounter (Signed)
Appt has been rescheduled for tomorrow

## 2020-09-23 ENCOUNTER — Other Ambulatory Visit: Payer: Self-pay

## 2020-09-23 ENCOUNTER — Ambulatory Visit (INDEPENDENT_AMBULATORY_CARE_PROVIDER_SITE_OTHER): Payer: 59 | Admitting: Endocrinology

## 2020-09-23 ENCOUNTER — Encounter: Payer: Self-pay | Admitting: Endocrinology

## 2020-09-23 VITALS — BP 128/84 | HR 75 | Ht 63.0 in | Wt 194.8 lb

## 2020-09-23 DIAGNOSIS — E669 Obesity, unspecified: Secondary | ICD-10-CM | POA: Diagnosis not present

## 2020-09-23 DIAGNOSIS — I10 Essential (primary) hypertension: Secondary | ICD-10-CM | POA: Diagnosis not present

## 2020-09-23 DIAGNOSIS — E7800 Pure hypercholesterolemia, unspecified: Secondary | ICD-10-CM

## 2020-09-23 DIAGNOSIS — E1169 Type 2 diabetes mellitus with other specified complication: Secondary | ICD-10-CM

## 2020-09-23 LAB — POCT GLYCOSYLATED HEMOGLOBIN (HGB A1C): Hemoglobin A1C: 5.5 % (ref 4.0–5.6)

## 2020-09-23 MED ORDER — SPIRONOLACTONE 100 MG PO TABS: 100.0000 mg | ORAL_TABLET | Freq: Every day | ORAL | 1 refills | Status: DC

## 2020-09-23 NOTE — Patient Instructions (Signed)
Take Atorvastatin daily

## 2020-09-23 NOTE — Progress Notes (Signed)
Patient ID: Susan Whitaker, female   DOB: Apr 01, 1971, 50 y.o.   MRN: [id number]            Reason for Appointment: Follow-up for Type 2 Diabetes  Referring physician:  Debbrah Alar   History of Present Illness:          Date of diagnosis of type 2 diabetes mellitus: 2013        Background history:  She was diagnosed to have diabetes when she was having fibroid surgery. A1c in 2013 was 6.9 She was treated with metformin but she says she was not able to take this because of diarrhea and would be irregular with it However her blood sugars stayed about the same until 2015 when they were higher and glipizide added Her blood sugars had been much higher since about late 2015 with A1c in the 9-10 range with her taking glipizide alone  Recent history:   Non-insulin hypoglycemic drugs: Invokana [id number] mg daily, Trulicity 3 mg weekly  Current management, levels of control, and problems identified:  Her A1c had gone up to 8.1 in November 2019 but has been below 6% subsequently  A1c is now 5.5 compared to 6.4 in 2/22   She believes she is checking her blood sugars 3 times a week but did not bring any monitoring  Again not clear if she is checking readings consistently and probably checking mostly in the morning  Since her last visit she has gone up to 3 mg on her Trulicity which she thinks is helping with some weight loss also started walking regularly recently  Blood sugar has been mostly under 100 when she was hospitalized earlier this month, highest 129  He has no nausea with Trulicity and also taking Invokana regularly    Side effects from medications have been: Diarrhea from regular metformin   Glucose monitoring: Irregularly    Blood Glucose readings mostly 90-100 range, did not bring monitor for download   Self-care: The diet that the patient has been following is: tries to limit Portions .     Typical meal intake: Breakfast is  fruit, Activia Yogurt,  For snacks she  will have Snack box, chicken breast at dinnertime.  No Sweet drinks                  Dietician visit, most recent: 5/17, has  had diabetes education classes                Weight history:  Wt Readings from Last 3 Encounters:  09/23/20 194 lb 12.8 oz (88.4 kg)  09/14/20 197 lb 15.6 oz (89.8 kg)  08/29/20 198 lb (89.8 kg)    Glycemic control:   Lab Results  Component Value Date   HGBA1C 5.5 09/23/2020   HGBA1C 6.4 06/25/2020   HGBA1C 6.0 01/24/2020   Lab Results  Component Value Date   MICROALBUR <0.7 06/25/2020   LDLCALC 107 (H) 06/25/2020   CREATININE 0.86 09/16/2020   Lab Results  Component Value Date   MICRALBCREAT 0.9 06/25/2020   Office Visit on 09/23/2020  Component Date Value Ref Range Status  . Hemoglobin A1C 09/23/2020 5.5  4.0 - 5.6 % Final  Admission on 09/14/2020, Discharged on 09/17/2020  Component Date Value Ref Range Status  . WBC 09/14/2020 12.2* 4.0 - 10.5 K/uL Final  . RBC 09/14/2020 4.58  3.87 - 5.11 MIL/uL Final  . Hemoglobin 09/14/2020 13.4  12.0 - 15.0 g/dL Final  . HCT 09/14/2020 40.4  36.0 -  46.0 % Final  . MCV 09/14/2020 88.2  80.0 - 100.0 fL Final  . MCH 09/14/2020 29.3  26.0 - 34.0 pg Final  . MCHC 09/14/2020 33.2  30.0 - 36.0 g/dL Final  . RDW 09/14/2020 13.8  11.5 - 15.5 % Final  . Platelets 09/14/2020 515* 150 - 400 K/uL Final  . nRBC 09/14/2020 0.0  0.0 - 0.2 % Final  . Neutrophils Relative % 09/14/2020 39  % Final  . Neutro Abs 09/14/2020 4.8  1.7 - 7.7 K/uL Final  . Lymphocytes Relative 09/14/2020 52  % Final  . Lymphs Abs 09/14/2020 6.4* 0.7 - 4.0 K/uL Final  . Monocytes Relative 09/14/2020 7  % Final  . Monocytes Absolute 09/14/2020 0.8  0.1 - 1.0 K/uL Final  . Eosinophils Relative 09/14/2020 1  % Final  . Eosinophils Absolute 09/14/2020 0.1  0.0 - 0.5 K/uL Final  . Basophils Relative 09/14/2020 0  % Final  . Basophils Absolute 09/14/2020 0.1  0.0 - 0.1 K/uL Final  . Immature Granulocytes 09/14/2020 1  % Final  . Abs Immature  Granulocytes 09/14/2020 0.06  0.00 - 0.07 K/uL Final   Performed at [facility], [address] [place], [place] [address]  . Sodium 09/14/2020 136  135 - 145 mmol/L Final  . Potassium 09/14/2020 3.4* 3.5 - 5.1 mmol/L Final  . Chloride 09/14/2020 99  98 - 111 mmol/L Final  . CO2 09/14/2020 24  22 - 32 mmol/L Final  . Glucose, Bld 09/14/2020 130* 70 - 99 mg/dL Final   Glucose reference range applies only to samples taken after fasting for at least 8 hours.  . BUN 09/14/2020 17  6 - 20 mg/dL Final  . Creatinine, Ser 09/14/2020 0.88  0.44 - 1.00 mg/dL Final  . Calcium 09/14/2020 9.5  8.9 - 10.3 mg/dL Final  . Total Protein 09/14/2020 8.7* 6.5 - 8.1 g/dL Final  . Albumin 09/14/2020 4.0  3.5 - 5.0 g/dL Final  . AST 09/14/2020 20  15 - 41 U/L Final  . ALT 09/14/2020 20  0 - 44 U/L Final  . Alkaline Phosphatase 09/14/2020 55  38 - 126 U/L Final  . Total Bilirubin 09/14/2020 0.2* 0.3 - 1.2 mg/dL Final  . GFR, Estimated 09/14/2020 >60  >60 mL/min Final   Comment: (NOTE) Calculated using the CKD-EPI Creatinine Equation (2021)   . Anion gap 09/14/2020 13  5 - 15 Final   Performed at [facility], [address] [place], [place] [address]  . Lipase 09/14/2020 40  11 - 51 U/L Final   Performed at [facility], [address] [place], [place] [address]  . Lactic Acid, Venous 09/14/2020 2.3* 0.5 - 1.9 mmol/L Final   Comment: CRITICAL RESULT CALLED TO, READ BACK BY AND VERIFIED WITH:  [facility] RN @2318  09/14/20 EDENSCA Performed at [facility], [address] [place], [place] [address]   . Lactic Acid, Venous 09/15/2020 2.0* 0.5 - 1.9 mmol/L Final   Comment: CRITICAL RESULT CALLED TO, READ BACK BY AND VERIFIED WITH: Lynann Beaver RN ON 09/15/20 AT 0142 [facility] Performed at [facility], [address] [place], [place] [address]   . Magnesium 09/14/2020 2.4  1.7 - 2.4 mg/dL Final   Performed at [facility], [address] [place], [place] [address]  . Phosphorus 09/14/2020 2.6  2.5 - 4.6 mg/dL Final   Performed at [facility]  [address], [address] [place], [place] [address]  . Prealbumin 09/15/2020 31.9  18 - 38 mg/dL Final   Performed at [facility], [place] [address] [place], [place] [address]  . SARS Coronavirus 2 09/15/2020 NEGATIVE  NEGATIVE Final   Comment: (NOTE) SARS-CoV-2 target nucleic acids are NOT DETECTED.  The SARS-CoV-2 RNA is generally detectable in upper and lower respiratory specimens during the acute phase of infection. Negative results do not preclude SARS-CoV-2 infection, do not rule out co-infections with other pathogens, and should not be used as the sole basis for treatment or other patient management decisions. Negative results must be combined with clinical observations, patient history, and epidemiological information. The expected result is Negative.  Fact Sheet for Patients: SugarRoll.be  Fact Sheet for Healthcare Providers: https://www.woods-mathews.com/  This test is not yet approved or cleared by the [place] FDA and  has been authorized for detection and/or diagnosis of SARS-CoV-2 by FDA under an Emergency Use Authorization (EUA). This EUA will remain  in effect (meaning this test can be used) for the duration of the COVID-19 declaration under Se                          ction 564(b)(1) of the Act, 21 U.S.C. section 360bbb-3(b)(1), unless the authorization is terminated or revoked sooner.  Performed at [facility], [place] [address], [place], [place] [address]   . HIV Screen 4th Generation wRfx 09/15/2020 Non Reactive  Non Reactive Final   Performed at [facility], [place] [address] [place], [place] [address]  . Glucose-Capillary 09/15/2020 79  70 - 99 mg/dL Final   Glucose reference range applies only to samples taken after fasting for at least 8 hours.  . Lactic Acid, Venous 09/15/2020 0.9  0.5 -  1.9 mmol/L Final   Performed at [facility], [place] [address] [place], [place] [address]  . Lactic Acid, Venous 09/15/2020 1.3  0.5 - 1.9 mmol/L Final   Performed at [facility], [place] [address], [place], [place] [address]  . Sodium 09/16/2020 137  135 - 145 mmol/L Final  . Potassium 09/16/2020 4.0  3.5 - 5.1 mmol/L Final  . Chloride 09/16/2020 99  98 - 111 mmol/L Final  . CO2 09/16/2020 29  22 - 32 mmol/L Final  . Glucose, Bld 09/16/2020 85  70 - 99 mg/dL Final   Glucose reference range applies only to samples taken after fasting for at least 8 hours.  . BUN 09/16/2020 7  6 - 20 mg/dL Final  . Creatinine, Ser 09/16/2020 0.86  0.44 - 1.00 mg/dL Final  . Calcium 09/16/2020 8.9  8.9 - 10.3 mg/dL Final  . GFR, Estimated 09/16/2020 >60  >60 mL/min Final   Comment: (NOTE) Calculated using the CKD-EPI Creatinine Equation (2021)   . Anion gap 09/16/2020 9  5 - 15 Final   Performed at [place] [address], [place],  [address]  . WBC 09/16/2020 7.3  4.0 - 10.5 K/uL Final  . RBC 09/16/2020 4.18  3.87 - 5.11 MIL/uL Final  . Hemoglobin 09/16/2020 12.3  12.0 - 15.0 g/dL Final  . HCT 09/16/2020 37.7  36.0 - 46.0 % Final  . MCV 09/16/2020 90.2  80.0 - 100.0 fL Final  . MCH 09/16/2020 29.4  26.0 - 34.0 pg Final  . MCHC 09/16/2020 32.6  30.0 - 36.0 g/dL Final  . RDW 09/16/2020 14.0  11.5 - 15.5 % Final  . Platelets 09/16/2020 418* 150 - 400 K/uL Final  . nRBC 09/16/2020 0.0  0.0 - 0.2 % Final   Performed at [facility], [place] [address], [place], [place] [address]  . Glucose-Capillary 09/15/2020 88  70 - 99 mg/dL Final   Glucose reference range applies only to samples taken after fasting for at least 8 hours.  . Glucose-Capillary 09/16/2020 96  70 - 99 mg/dL Final   Glucose reference range applies only to samples taken after fasting for at least 8 hours.  . Glucose-Capillary 09/16/2020 91  70 - 99 mg/dL Final   Glucose reference range applies only to samples  taken after fasting for at least 8 hours.  . Glucose-Capillary 09/16/2020 129* 70 - 99 mg/dL Final   Glucose reference range applies only to samples taken after fasting for at least 8 hours.  . Glucose-Capillary 09/16/2020 80  70 - 99 mg/dL Final   Glucose reference range applies only to samples taken after fasting for at least 8 hours.  . Glucose-Capillary 09/17/2020 99  70 - 99 mg/dL Final   Glucose reference range applies only to samples taken after fasting for at least 8 hours.     Other active problems are in review of systems    Allergies as of 09/23/2020      Reactions   Lisinopril Cough      Medication List       Accurate as of Sep 23, 2020  4:58 PM. If you have any questions, ask your nurse or doctor.        amLODipine 10 MG tablet Commonly known as: NORVASC Take 1 tablet (10 mg total) by mouth daily.   aspirin EC 81 MG tablet Take 1 tablet (81 mg total) by mouth daily.   atorvastatin 40 MG tablet Commonly known as: LIPITOR Take 1 tablet (40 mg total) by mouth daily.   glucose blood test strip Commonly known as: Accu-Chek Guide 1 each by Other route 3 (three) times daily. Use as instructed to check once daily.   Invokana 300 MG Tabs tablet Generic drug: canagliflozin TAKE 1 TABLET BY MOUTH ONCE DAILY BEFORE BREAKFAST (NEEDS  APPOINTMENT  FOR  MORE  REFILLS) What changed: See the new instructions.   losartan 100 MG tablet Commonly known as: COZAAR Take 1 tablet (100 mg total) by mouth daily.   metoprolol tartrate 100 MG tablet Commonly known as: LOPRESSOR Take 1 tablet (100 mg total) by mouth 2 (two) times daily.   OneTouch Delica Lancets Fine Misc Use to check blood sugar 3 times daily   saccharomyces boulardii 250 MG capsule Commonly known as: FLORASTOR Take 250 mg by mouth daily.   spironolactone 100 MG tablet Commonly known as: ALDACTONE Take 1 tablet (100 mg total) by mouth daily.   Trulicity 3 0000000 Sopn Generic drug:  Dulaglutide Inject 3 mg once a week What changed:   how much to take  how to take this  when to take this  additional instructions       Allergies:  Allergies  Allergen Reactions  . Lisinopril Cough    Past Medical History:  Diagnosis Date  . Class 2 obesity due to excess calories with body mass index (BMI) of 35.0 to 35.9 in adult 06/2010  . DM type 2 (diabetes mellitus, type 2) ([place])   . Gastric ulcer   . GERD (gastroesophageal reflux disease)   . H/O constipation   . H/O hemorrhoids   .  History of small bowel obstruction 06/02/2019  . Hyperlipidemia   . Hypertension   . Hypokalemia 2019  . Menorrhagia   . Primary hyperaldosteronism ([place]) 02/21/2012  . SVT (supraventricular tachycardia) ([place])    Noted 11/2011 admission  . Thrombocytopenia ([place])     Past Surgical History:  Procedure Laterality Date  . ABDOMINAL ADHESION SURGERY  06/03/2019   Dr Marlou Starks  . ABDOMINAL SURGERY     59 months of age-- unsure of type of surgery  . COLONOSCOPY  04/2016   hemorrhoids--normal per pt with Dr Collene Mares  . DILATATION & CURETTAGE/HYSTEROSCOPY WITH MYOSURE N/A 02/08/2020   Procedure: DILATATION & CURETTAGE/HYSTEROSCOPY WITH MYOSURE;  Surgeon: Waymon Amato, MD;  Location: [facility];  Service: Gynecology;  Laterality: N/A;  . HEMORRHOID SURGERY  2005/2006  . INTRAUTERINE DEVICE (IUD) INSERTION N/A 02/08/2020   Procedure: INTRAUTERINE DEVICE (IUD) INSERTION UNDER ULTRASOUND GUIDANCE;  Surgeon: Waymon Amato, MD;  Location: [facility];  Service: Gynecology;  Laterality: N/A;  [place]  . LAPAROTOMY N/A 06/03/2019   Procedure: EXPLORATORY LAPAROTOMY WITH LYSIS OF ADHESIONS;  Surgeon: Jovita Kussmaul, MD;  Location: [facility];  Service: General;  Laterality: N/A;  . UTERINE FIBROID EMBOLIZATION  2010   at [facility]    Family History  Problem Relation Age of Onset  . Cancer Mother        breast  . Stroke Mother   . Cancer Maternal Grandmother        breast  .  Hypertension Maternal Grandmother   . Cancer Other        breast, lung  . Hypertension Other   . Stroke Other   . Vision loss Father        some  . Heart disease Father        Rhythm disturbance  . Hypertension Sister   . Hypertension Brother   . Hypertension Brother   . Diabetes Neg Hx     Social History:  reports that she has never smoked. She has never used smokeless tobacco. She reports that she does not drink alcohol and does not use drugs.    Review of Systems    Lipid history: LDL 171 At baseline Lipitor 20 mg prescribed by PCP Last LDL was above target  Despite reminders she does not take Lipitor regularly and likely does not have a current prescription    Lab Results  Component Value Date   CHOL 164 06/25/2020   HDL 43.80 06/25/2020   LDLCALC 107 (H) 06/25/2020   LDLDIRECT 103.1 03/04/2014   TRIG 64.0 06/25/2020   CHOLHDL 4 06/25/2020           Hypertension:   She is on amlodipine, metoprolol and losartan 100 mg prescribed by her PCP  Because of hypokalemia she is also on 100 mg of Aldactone, last potassium normal However she needed potassium supplementation recently in the hospital and has been out of Aldactone for 2 weeks     BP Readings from Last 3 Encounters:  09/23/20 128/84  09/17/20 108/64  08/29/20 124/76    Lab Results  Component Value Date   CREATININE 0.86 09/16/2020   BUN 7 09/16/2020   NA 137 09/16/2020   K 4.0 09/16/2020   CL 99 09/16/2020   CO2 29 09/16/2020     History of goiter with multiple nodules, stable as of 2015, largest 1.4 cm nodule in the right side  Lab Results  Component Value Date   TSH 1.691 06/10/2020  Most recent foot exam: 03/2019 Eye exam normal in 02/2019   Review of Systems    Physical Examination:  BP 128/84   Pulse 75   Ht 5\' 3"  (1.6 m)   Wt 194 lb 12.8 oz (88.4 kg)   SpO2 99%   BMI 34.51 kg/m        ASSESSMENT/PLAN:  Diabetes type 2, obese  See history of present illness  for detailed discussion of current diabetes management, blood sugar patterns and problems identified  Her A1c is improved and below 6% now  She is on a regimen with Trulicity 3 mg weekly, Invokana 300 mg   Her blood sugars are very well controlled although not monitoring much at home Likely with increasing Trulicity as well as starting some exercise her blood sugars are excellent However weight loss has been only a small amount since her last visit 8 months ago  For now she will continue the same regimen She will try to continue increasing her walking To check periodic readings after meals also  HYPERTENSION: Diastolic is high normal and she needs to restart Aldactone especially with tendency to hypokalemia, new prescription given     Lipids: Will need for her to take Lipitor consistently and she will make sure she gets the prescription from her PCP at the upcoming visit    Follow-up in 4 months      Patient Instructions  Take Atorvastatin daily   Elayne Snare 09/23/2020, 4:58 PM   Note: This office note was prepared with Dragon voice recognition system technology. Any transcriptional errors that result from this process are unintentional.

## 2020-09-24 ENCOUNTER — Encounter: Payer: Self-pay | Admitting: Nurse Practitioner

## 2020-09-24 ENCOUNTER — Ambulatory Visit: Payer: 59 | Admitting: Nurse Practitioner

## 2020-09-24 ENCOUNTER — Other Ambulatory Visit: Payer: Self-pay

## 2020-09-24 VITALS — BP 120/80 | HR 74 | Temp 97.4°F | Wt 199.4 lb

## 2020-09-24 DIAGNOSIS — K56609 Unspecified intestinal obstruction, unspecified as to partial versus complete obstruction: Secondary | ICD-10-CM

## 2020-09-24 DIAGNOSIS — K562 Volvulus: Secondary | ICD-10-CM | POA: Diagnosis not present

## 2020-09-24 LAB — CBC WITH DIFFERENTIAL/PLATELET
Basophils Absolute: 0 10*3/uL (ref 0.0–0.1)
Basophils Relative: 0.5 % (ref 0.0–3.0)
Eosinophils Absolute: 0.1 10*3/uL (ref 0.0–0.7)
Eosinophils Relative: 1.8 % (ref 0.0–5.0)
HCT: 38.6 % (ref 36.0–46.0)
Hemoglobin: 12.9 g/dL (ref 12.0–15.0)
Lymphocytes Relative: 43.4 % (ref 12.0–46.0)
Lymphs Abs: 3.3 10*3/uL (ref 0.7–4.0)
MCHC: 33.3 g/dL (ref 30.0–36.0)
MCV: 88.3 fl (ref 78.0–100.0)
Monocytes Absolute: 0.5 10*3/uL (ref 0.1–1.0)
Monocytes Relative: 6.5 % (ref 3.0–12.0)
Neutro Abs: 3.6 10*3/uL (ref 1.4–7.7)
Neutrophils Relative %: 47.8 % (ref 43.0–77.0)
Platelets: 434 10*3/uL — ABNORMAL HIGH (ref 150.0–400.0)
RBC: 4.37 Mil/uL (ref 3.87–5.11)
RDW: 13.9 % (ref 11.5–15.5)
WBC: 7.6 10*3/uL (ref 4.0–10.5)

## 2020-09-24 LAB — COMPREHENSIVE METABOLIC PANEL
ALT: 13 U/L (ref 0–35)
AST: 11 U/L (ref 0–37)
Albumin: 4.2 g/dL (ref 3.5–5.2)
Alkaline Phosphatase: 56 U/L (ref 39–117)
BUN: 18 mg/dL (ref 6–23)
CO2: 30 mEq/L (ref 19–32)
Calcium: 9.6 mg/dL (ref 8.4–10.5)
Chloride: 101 mEq/L (ref 96–112)
Creatinine, Ser: 0.91 mg/dL (ref 0.40–1.20)
GFR: 73.02 mL/min (ref >60.00)
Glucose, Bld: 89 mg/dL (ref 70–99)
Potassium: 4.2 mEq/L (ref 3.5–5.1)
Sodium: 139 mEq/L (ref 135–145)
Total Bilirubin: 0.5 mg/dL (ref 0.2–1.2)
Total Protein: 7.7 g/dL (ref 6.0–8.3)

## 2020-09-24 NOTE — Progress Notes (Signed)
Subjective:  Patient ID: [name], female    DOB: 30-Nov-1970  Age: 50 y.o. MRN: [id number]  CC: Hospitalization Follow-up (Pt was in hospital due to stomach issues, pt states there is improvement since that visit. )  HPI  SBO (small bowel obstruction) (HCC) Hx of recurrent volvulus, this is the 3rd episode. 1st occurrence at age 42months which led to GI surery (unable to get pediatric record from [place]). 2nd incidence 2021. All symptoms spontaneously resolved during recent hospitalization. today she reports BM daily with use of probiotics, No ABD pain, no N/V Gen, surgery appt with Dr. Marlou Starks: 10/2020 Has not scheduled appt with Dr. Collene Mares (GI)  Repeat cbc and CMP today Maintain current medications  BP Readings from Last 3 Encounters:  10/04/20 (!) 113/91  09/24/20 120/80  09/23/20 128/84   Wt Readings from Last 3 Encounters:  10/03/20 194 lb (88 kg)  09/24/20 199 lb 6.4 oz (90.4 kg)  09/23/20 194 lb 12.8 oz (88.4 kg)   Reviewed past Medical, Social and Family history today.  Outpatient Medications Prior to Visit  Medication Sig Dispense Refill  . amLODipine (NORVASC) 10 MG tablet Take 1 tablet (10 mg total) by mouth daily. 90 tablet 3  . aspirin EC 81 MG tablet Take 1 tablet (81 mg total) by mouth daily.    Marland Kitchen atorvastatin (LIPITOR) 40 MG tablet Take 1 tablet (40 mg total) by mouth daily. 90 tablet 1  . Dulaglutide (TRULICITY) 3 GH/8.2XH SOPN Inject 3 mg once a week (Patient taking differently: Inject 3 mg into the skin every Sunday.) 4 mL 2  . glucose blood (ACCU-CHEK GUIDE) test strip 1 each by Other route 3 (three) times daily. Use as instructed to check once daily. 100 each 12  . INVOKANA 300 MG TABS tablet TAKE 1 TABLET BY MOUTH ONCE DAILY BEFORE BREAKFAST (NEEDS  APPOINTMENT  FOR  MORE  REFILLS) (Patient taking differently: Take 300 mg by mouth daily before breakfast.) 30 tablet 0  . losartan (COZAAR) 100 MG tablet Take 1 tablet (100 mg total) by mouth daily. 90 tablet 3  .  ONETOUCH DELICA LANCETS FINE MISC Use to check blood sugar 3 times daily 100 each 3  . spironolactone (ALDACTONE) 100 MG tablet Take 1 tablet (100 mg total) by mouth daily. 90 tablet 1  . metoprolol tartrate (LOPRESSOR) 100 MG tablet Take 1 tablet (100 mg total) by mouth 2 (two) times daily. 180 tablet 1  . saccharomyces boulardii (FLORASTOR) 250 MG capsule Take 250 mg by mouth daily.     No facility-administered medications prior to visit.   ROS See HPI  Objective:  BP 120/80 (BP Location: Left Arm, Patient Position: Sitting, Cuff Size: Large)   Pulse 74   Temp (!) 97.4 F (36.3 C) (Temporal)   Wt 199 lb 6.4 oz (90.4 kg)   LMP 09/03/2020 (Approximate)   SpO2 100%   BMI 35.32 kg/m   Physical Exam Abdominal:     General: Bowel sounds are normal.     Palpations: Abdomen is soft.  Neurological:     Mental Status: She is alert and oriented to person, place, and time.    Assessment & Plan:  This visit occurred during the SARS-CoV-2 public health emergency.  Safety protocols were in place, including screening questions prior to the visit, additional usage of staff PPE, and extensive cleaning of exam room while observing appropriate contact time as indicated for disinfecting solutions.   Berdina was seen today for hospitalization follow-up.  Diagnoses and all orders for this visit:  SBO (small bowel obstruction) (HCC) -     CBC w/Diff -     Comprehensive metabolic panel  Volvulus (HCC) -     CBC w/Diff -     Comprehensive metabolic panel   Problem List Items Addressed This Visit      Digestive   SBO (small bowel obstruction) (HCC) - Primary    Hx of recurrent volvulus, this is the 3rd episode. 1st occurrence at age 64months which led to GI surery (unable to get pediatric record from [place]). 2nd incidence 2021. All symptoms spontaneously resolved during recent hospitalization. today she reports BM daily with use of probiotics, No ABD pain, no N/V Gen, surgery appt with Dr. Marlou Starks:  10/2020 Has not scheduled appt with Dr. Collene Mares (GI)  Repeat cbc and CMP today Maintain current medications      Relevant Orders   CBC w/Diff (Completed)   Comprehensive metabolic panel (Completed)   Volvulus ([place])   Relevant Orders   CBC w/Diff (Completed)   Comprehensive metabolic panel (Completed)      Follow-up: Return in about 7 months (around 04/26/2021) for CPE (fasting).  Wilfred Lacy, NP

## 2020-09-24 NOTE — Patient Instructions (Signed)
Schedule appt with GI Maintain upcoming appt with surgeon Go to lab for blood draw  Volvulus  Volvulus is an abnormal twisting of a portion of the digestive tract. The digestive tract begins with the part of the body that moves food from your mouth to your stomach (esophagus). It also includes the stomach, small intestine, and large intestine. With volvulus, the twisting can block the flow of digestion (bowel obstruction). It can also block the flow of blood to the part of the digestive tract that is twisted. Lack of blood flow can cause the twisted part of the digestive tract to die. Volvulus is a medical emergency. There are various types of volvulus:  Sigmoid volvulus is a twisting of the last part of the large intestine. This is the most common type.  Midgut volvulus usually occurs in children who are born with an abnormally positioned small intestine (malrotation).  Cecal volvulus may be caused by scar tissue from previous abdominal surgery.  Gastric volvulus is a rare type of volvulus that occurs when the stomach twists around itself. What are the causes? This condition may be caused by many different things. It can be something a person is born with (congenital deformity), or it may be a problem that develops from another condition. What increases the risk? You are more likely to develop this condition if you:  Are 71 years old or older.  Have long-term (chronic) constipation.  Have part of your stomach located above the area where the stomach and esophagus meet (paraesophageal hernia).  Are bedridden.  Have had previous abdominal surgery.  Live in a long-term care facility. What are the signs or symptoms? Symptoms of most types of volvulus include:  Abdominal pain. ? Sigmoid volvulus may cause pain in the lower left part of the abdomen. ? Cecal volvulus may cause pain in the lower right part of the abdomen. ? Gastric and midgut volvulus may cause pain in the upper  abdomen.  Bloating and swelling of the abdomen.  Decreased passing of gas or inability to pass gas.  Nausea.  Vomiting.  Constipation.  Tenderness when pressing on the abdomen. As the condition gets worse, the volvulus can develop a hole (perforation) and leak digestive contents into the abdomen. This can cause late signs of volvulus, including:  Severe infection (sepsis).  Bleeding into the abdomen.  Very low blood pressure (shock). How is this diagnosed? This condition may be diagnosed based on:  Your symptoms. The health care provider may suspect volvulus if you have sudden symptoms of intestinal obstruction.  A physical exam. During the exam, the health care provider will listen to your abdomen for the sounds of digestion and will feel your abdomen for tenderness.  Imaging studies of your abdomen, such as: ? CT scan. This is the best imaging study for diagnosing volvulus. ? Plain X-rays. These may show air and fluid levels and widening above the obstruction. ? Ultrasound. How is this treated? This condition is almost always a medical emergency that requires surgery right away. Options include:  Emergency colonoscopy. A lubricated, flexible tube that has a camera on the end of it is inserted into the anus and then passed into the rectum, colon, and other parts of the large intestine. If your volvulus is in the large intestine, this procedure may be an option for untwisting it.  An abdominal surgery to untwist the volvulus.  If your volvulus cannot be untwisted, you may need to have part of the digestive tract removed (resection). Follow  these instructions at home:  Follow instructions from your health care provider about recovery after your procedure.  Get plenty of rest.  Follow instructions about eating restrictions. You may need to avoid solid foods and consume only clear liquids until your condition improves.  Take over-the-counter and prescription medicines only  as told by your health care provider.  Keep all follow-up visits as told by your health care provider. This is important. Get help right away if:  You have increased pain or cramping.  You have a fever.  Your abdomen is swollen.  You have nausea or vomiting.  You have blood in your stool or vomit. Summary  Volvulus is an abnormal twisting of a portion of the digestive tract.  Lack of blood flow can cause the twisted part of the digestive tract to die.  Intense abdominal pain, nausea, vomiting, and an inability to pass gas are symptoms of volvulus.  Volvulus is a medical emergency. It usually requires surgery right away. This information is not intended to replace advice given to you by your health care provider. Make sure you discuss any questions you have with your health care provider. Document Revised: 08/10/2017 Document Reviewed: 03/30/2017 Elsevier Patient Education  2021 [employer].

## 2020-09-26 ENCOUNTER — Ambulatory Visit: Payer: 59 | Admitting: Nurse Practitioner

## 2020-10-02 ENCOUNTER — Other Ambulatory Visit (HOSPITAL_COMMUNITY): Payer: 59

## 2020-10-02 NOTE — Pre-Procedure Instructions (Signed)
Instructed patient on the following items: °Arrival time 0530 °Nothing to eat or drink after midnight °No meds AM of procedure °Responsible person to drive you home and stay with you for 24 hrs ° ° °   °

## 2020-10-03 ENCOUNTER — Inpatient Hospital Stay (HOSPITAL_COMMUNITY): Payer: 59

## 2020-10-03 ENCOUNTER — Ambulatory Visit (HOSPITAL_COMMUNITY)
Admission: AD | Disposition: A | Discharge status: Home / Self Care | Payer: 59 | Source: Home / Self Care | Attending: Internal Medicine

## 2020-10-03 ENCOUNTER — Ambulatory Visit (HOSPITAL_COMMUNITY): Payer: 59

## 2020-10-03 ENCOUNTER — Encounter (HOSPITAL_COMMUNITY): Payer: Self-pay | Admitting: Internal Medicine

## 2020-10-03 ENCOUNTER — Other Ambulatory Visit: Payer: Self-pay

## 2020-10-03 ENCOUNTER — Observation Stay (HOSPITAL_COMMUNITY)
Admission: AD | Admit: 2020-10-03 | Discharge: 2020-10-04 | Disposition: A | Discharge status: Home / Self Care | Payer: 59 | Attending: Internal Medicine | Admitting: Internal Medicine

## 2020-10-03 DIAGNOSIS — Z8679 Personal history of other diseases of the circulatory system: Secondary | ICD-10-CM

## 2020-10-03 DIAGNOSIS — Z7984 Long term (current) use of oral hypoglycemic drugs: Secondary | ICD-10-CM | POA: Diagnosis not present

## 2020-10-03 DIAGNOSIS — E119 Type 2 diabetes mellitus without complications: Secondary | ICD-10-CM | POA: Insufficient documentation

## 2020-10-03 DIAGNOSIS — I471 Supraventricular tachycardia, unspecified: Secondary | ICD-10-CM | POA: Diagnosis present

## 2020-10-03 DIAGNOSIS — Z9889 Other specified postprocedural states: Secondary | ICD-10-CM

## 2020-10-03 DIAGNOSIS — R109 Unspecified abdominal pain: Secondary | ICD-10-CM | POA: Diagnosis not present

## 2020-10-03 DIAGNOSIS — Z7982 Long term (current) use of aspirin: Secondary | ICD-10-CM | POA: Diagnosis not present

## 2020-10-03 DIAGNOSIS — G8929 Other chronic pain: Secondary | ICD-10-CM | POA: Diagnosis present

## 2020-10-03 DIAGNOSIS — Z20822 Contact with and (suspected) exposure to covid-19: Secondary | ICD-10-CM | POA: Insufficient documentation

## 2020-10-03 DIAGNOSIS — Z79899 Other long term (current) drug therapy: Secondary | ICD-10-CM | POA: Diagnosis not present

## 2020-10-03 DIAGNOSIS — I10 Essential (primary) hypertension: Secondary | ICD-10-CM | POA: Diagnosis not present

## 2020-10-03 HISTORY — PX: SVT ABLATION: EP1225

## 2020-10-03 LAB — GLUCOSE, CAPILLARY
Glucose-Capillary: 88 mg/dL (ref 70–99)
Glucose-Capillary: 101 mg/dL — ABNORMAL HIGH (ref 70–99)

## 2020-10-03 LAB — COMPREHENSIVE METABOLIC PANEL
ALT: 19 U/L (ref 0–44)
AST: 23 U/L (ref 15–41)
Albumin: 3.8 g/dL (ref 3.5–5.0)
Alkaline Phosphatase: 55 U/L (ref 38–126)
Anion gap: 8 (ref 5–15)
BUN: 15 mg/dL (ref 6–20)
CO2: 27 mmol/L (ref 22–32)
Calcium: 8.9 mg/dL (ref 8.9–10.3)
Chloride: 98 mmol/L (ref 98–111)
Creatinine, Ser: 0.74 mg/dL (ref 0.44–1.00)
GFR, Estimated: >60 mL/min (ref >60)
Glucose, Bld: 93 mg/dL (ref 70–99)
Potassium: 3.8 mmol/L (ref 3.5–5.1)
Sodium: 133 mmol/L — ABNORMAL LOW (ref 135–145)
Total Bilirubin: 0.6 mg/dL (ref 0.3–1.2)
Total Protein: 7.9 g/dL (ref 6.5–8.1)

## 2020-10-03 LAB — CBC
HCT: 41.3 % (ref 36.0–46.0)
Hemoglobin: 13.5 g/dL (ref 12.0–15.0)
MCH: 29.4 pg (ref 26.0–34.0)
MCHC: 32.7 g/dL (ref 30.0–36.0)
MCV: 90 fL (ref 80.0–100.0)
Platelets: 402 10*3/uL — ABNORMAL HIGH (ref 150–400)
RBC: 4.59 MIL/uL (ref 3.87–5.11)
RDW: 13.4 % (ref 11.5–15.5)
WBC: 8.6 10*3/uL (ref 4.0–10.5)
nRBC: 0 % (ref 0.0–0.2)

## 2020-10-03 LAB — LIPASE, BLOOD: Lipase: 30 U/L (ref 11–51)

## 2020-10-03 LAB — SARS CORONAVIRUS 2 (TAT 6-24 HRS): SARS Coronavirus 2: NEGATIVE

## 2020-10-03 LAB — PREGNANCY, URINE: Preg Test, Ur: NEGATIVE

## 2020-10-03 LAB — LACTIC ACID, PLASMA: Lactic Acid, Venous: 0.8 mmol/L (ref 0.5–1.9)

## 2020-10-03 SURGERY — SVT ABLATION

## 2020-10-03 MED ORDER — BUPIVACAINE HCL (PF) 0.25 % IJ SOLN
INTRAMUSCULAR | Status: AC
  Filled 2020-10-03: qty 30

## 2020-10-03 MED ORDER — ZOLPIDEM TARTRATE 5 MG PO TABS: 5.0000 mg | ORAL_TABLET | Freq: Every evening | ORAL | Status: DC | PRN

## 2020-10-03 MED ORDER — MORPHINE SULFATE (PF) 2 MG/ML IV SOLN
2.0000 mg | INTRAVENOUS | Status: DC | PRN
  Administered 2020-10-03: 2 mg via INTRAVENOUS
  Filled 2020-10-03: qty 1

## 2020-10-03 MED ORDER — MIDAZOLAM HCL 5 MG/5ML IJ SOLN
INTRAMUSCULAR | Status: AC
  Filled 2020-10-03: qty 5

## 2020-10-03 MED ORDER — IOHEXOL 350 MG/ML SOLN
80.0000 mL | Freq: Once | INTRAVENOUS | Status: AC | PRN
  Administered 2020-10-03: 80 mL via INTRAVENOUS

## 2020-10-03 MED ORDER — SODIUM CHLORIDE 0.9 % IV SOLN: 250.0000 mL | INTRAVENOUS | Status: DC | PRN

## 2020-10-03 MED ORDER — HEPARIN (PORCINE) IN NACL 1000-0.9 UT/500ML-% IV SOLN
INTRAVENOUS | Status: DC | PRN
  Administered 2020-10-03: 500 mL

## 2020-10-03 MED ORDER — BUPIVACAINE HCL (PF) 0.25 % IJ SOLN
INTRAMUSCULAR | Status: DC | PRN
  Administered 2020-10-03: 60 mL

## 2020-10-03 MED ORDER — HYDROMORPHONE HCL 1 MG/ML IJ SOLN
1.0000 mg | INTRAMUSCULAR | Status: DC | PRN
  Administered 2020-10-03: 1 mg via INTRAVENOUS
  Filled 2020-10-03: qty 1

## 2020-10-03 MED ORDER — MORPHINE SULFATE (PF) 2 MG/ML IV SOLN
2.0000 mg | Freq: Once | INTRAVENOUS | Status: AC
Start: 2020-10-03 — End: 2020-10-03
  Administered 2020-10-03: 2 mg via INTRAVENOUS

## 2020-10-03 MED ORDER — HEPARIN (PORCINE) IN NACL 1000-0.9 UT/500ML-% IV SOLN
INTRAVENOUS | Status: AC
  Filled 2020-10-03: qty 500

## 2020-10-03 MED ORDER — SODIUM CHLORIDE 0.9 % IV SOLN: INTRAVENOUS | Status: DC

## 2020-10-03 MED ORDER — SODIUM CHLORIDE 0.9% FLUSH
3.0000 mL | Freq: Two times a day (BID) | INTRAVENOUS | Status: DC
  Administered 2020-10-03: 3 mL via INTRAVENOUS

## 2020-10-03 MED ORDER — FENTANYL CITRATE (PF) 100 MCG/2ML IJ SOLN
INTRAMUSCULAR | Status: DC | PRN
  Administered 2020-10-03 (×2): 25 ug via INTRAVENOUS
  Administered 2020-10-03: 12.5 ug via INTRAVENOUS
  Administered 2020-10-03: 25 ug via INTRAVENOUS

## 2020-10-03 MED ORDER — ACETAMINOPHEN 325 MG PO TABS: 650.0000 mg | ORAL_TABLET | ORAL | Status: DC | PRN

## 2020-10-03 MED ORDER — ONDANSETRON HCL 4 MG/2ML IJ SOLN
4.0000 mg | Freq: Four times a day (QID) | INTRAMUSCULAR | Status: DC | PRN
  Administered 2020-10-03: 4 mg via INTRAVENOUS
  Filled 2020-10-03: qty 2

## 2020-10-03 MED ORDER — MIDAZOLAM HCL 5 MG/5ML IJ SOLN
INTRAMUSCULAR | Status: DC | PRN
  Administered 2020-10-03 (×2): 2 mg via INTRAVENOUS
  Administered 2020-10-03: 1 mg via INTRAVENOUS

## 2020-10-03 MED ORDER — SODIUM CHLORIDE 0.9% FLUSH: 3.0000 mL | INTRAVENOUS | Status: DC | PRN

## 2020-10-03 MED ORDER — ONDANSETRON HCL 4 MG/2ML IJ SOLN: 4.0000 mg | Freq: Four times a day (QID) | INTRAMUSCULAR | 0 refills | Status: DC | PRN

## 2020-10-03 MED ORDER — MORPHINE SULFATE (PF) 2 MG/ML IV SOLN
INTRAVENOUS | Status: AC
  Filled 2020-10-03: qty 1

## 2020-10-03 MED ORDER — FENTANYL CITRATE (PF) 100 MCG/2ML IJ SOLN
INTRAMUSCULAR | Status: AC
  Filled 2020-10-03: qty 2

## 2020-10-03 SURGICAL SUPPLY — 11 items
BAG SNAP BAND KOVER 36X36 (MISCELLANEOUS) ×1 IMPLANT
CATH EZ STEER NAV 4MM D-F CUR (ABLATOR) ×1 IMPLANT
CATH JOSEPH QUAD ALLRED 6F REP (CATHETERS) ×2 IMPLANT
CATH JSN HEX 2-5-2 120 (CATHETERS) ×1 IMPLANT
PACK EP LATEX FREE (CUSTOM PROCEDURE TRAY) ×2
PACK EP LF (CUSTOM PROCEDURE TRAY) ×1 IMPLANT
PAD PRO RADIOLUCENT 2001M-C (PAD) ×2 IMPLANT
PATCH CARTO3 (PAD) ×2 IMPLANT
SHEATH PINNACLE 6F 10CM (SHEATH) ×2 IMPLANT
SHEATH PINNACLE 7F 10CM (SHEATH) ×1 IMPLANT
SHEATH PINNACLE 8F 10CM (SHEATH) ×1 IMPLANT

## 2020-10-03 NOTE — Progress Notes (Addendum)
SITE AREA: right upper chest  SITE PRIOR TO REMOVAL:  LEVEL 0  PRESSURE APPLIED FOR: approximately 10 minutes, pulled by Jackelyn Poling, RN  MANUAL: yes  PATIENT STATUS DURING PULL: in pain, yelling in regards to abdominal pain  POST PULL SITE:  LEVEL 0  POST PULL INSTRUCTIONS GIVEN: yes   DRESSING APPLIED: vaseline gauze, gauze with tegaderm

## 2020-10-03 NOTE — Progress Notes (Signed)
[facility] Progress Note  Day of Surgery  Subjective: CC:  [name] was seen by our service earlier this month for abdominal pain and CT scan concerning for possible mesenteric volvulus with SBO. Today she presented to the hospital for a scheduled cardiac ablation due to her history of SVT. After her procedure she reported abdominal pain and CCS was asked to evaluate. Pain described as acute onset, sharp/stabbing, central abdominal pain with some radiation to her bilateral lower flank. Pain stopped with morphine and did not return. Denies associated fever, chills, nausea, vomiting. Patient is having flatus, had a normal BM last night. She reports that, since February, she has had two episodes of this pain (5/1 and today 5/20).   The patients daughter is at bedside.  Objective: Vital signs in last 24 hours: Temp:  [98 F (36.7 C)-98.1 F (36.7 C)] 98 F (36.7 C) (05/20 0940) Pulse Rate:  [49-121] 104 (05/20 1030) Resp:  [7-27] 13 (05/20 1030) BP: (101-161)/(52-143) 123/84 (05/20 1030) SpO2:  [90 %-100 %] 96 % (05/20 1030) Weight:  [88 kg] 88 kg (05/20 0530)    Intake/Output from previous day: No intake/output data recorded. Intake/Output this shift: No intake/output data recorded.  PE: Gen:  Alert, NAD, pleasant Card:  Regular rate and rhythm, pedal pulses 2+ BL Pulm:  Normal effort, clear to auscultation bilaterally Abd: Soft, non-tender, non-distended, bowel sounds present in all 4 quadrants, no HSM previous laparotomy incision appears well-healing.  Skin: warm and dry, no rashes  Psych: A&Ox3    Lab Results:  CMP     Component Value Date/Time   NA 139 09/24/2020 1237   NA 138 08/21/2012 1002   K 4.2 09/24/2020 1237   K 3.5 08/21/2012 1002   CL 101 09/24/2020 1237   CL 102 08/21/2012 1002   CO2 30 09/24/2020 1237   CO2 28 08/21/2012 1002   GLUCOSE 89 09/24/2020 1237   GLUCOSE 132 (H) 08/21/2012 1002   BUN 18 09/24/2020 1237   BUN 16.7 08/21/2012  1002   CREATININE 0.91 09/24/2020 1237   CREATININE 0.83 12/03/2013 1112   CREATININE 1.0 08/21/2012 1002   CALCIUM 9.6 09/24/2020 1237   CALCIUM 8.9 08/21/2012 1002   PROT 7.7 09/24/2020 1237   PROT 7.6 08/21/2012 1002   ALBUMIN 4.2 09/24/2020 1237   ALBUMIN 3.3 (L) 08/21/2012 1002   AST 11 09/24/2020 1237   AST 10 08/21/2012 1002   ALT 13 09/24/2020 1237   ALT 8 08/21/2012 1002   ALKPHOS 56 09/24/2020 1237   ALKPHOS 60 08/21/2012 1002   BILITOT 0.5 09/24/2020 1237   BILITOT 0.22 08/21/2012 1002   GFRNONAA >60 09/16/2020 0716   GFRNONAA 86 12/03/2013 1112   GFRAA >60 02/05/2020 1017   GFRAA >89 12/03/2013 1112   Lipase     Component Value Date/Time   LIPASE 40 09/14/2020 2145   Studies/Results: EP STUDY  Result Date: 10/03/2020 CONCLUSIONS: 1. Sinus rhythm upon presentation. 2. The patient had dual AV nodal physiology with easily inducible classic AV nodal reentrant tachycardia. In addition, there was inducible atrial tachycardia located in the superior anterolateral region of the RA. 3. Successful radiofrequency modification of the slow AV nodal pathway and the high lateral RA 4. No inducible arrhythmias following ablation. 5. No early apparent complications. 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 Marlou Starks, Hx of unknown abdominal surgery at 55 months of age  - CBC, CMP, lactate, are all unremarkable. CT of the abdomen without signs of volvulus or obstruction. Pain now resolved. No acute surgical needs. Stable for discharge from a CCS Perspective if tolerates  PO. Has follow up in our clinic with Dr. Marlou Starks in June.    LOS: 0 days    Susan Whitaker, [facility] Please see Amion for pager number during day hours 7:00am-4:30pm

## 2020-10-03 NOTE — Progress Notes (Signed)
Pt c/o sudden abdominal pain in mid abdomen.  Pt started crying and screaming uncontrollably even after given morphine. However, she states her pain is only 5/10.  Paged cardiology.  Idolina Primer, RN

## 2020-10-03 NOTE — Progress Notes (Signed)
SITE AREA: right groin/femoral  SITE PRIOR TO REMOVAL:  LEVEL 0  PRESSURE APPLIED FOR: approximately 20 minutes, pulled by Jackelyn Poling, RN  MANUAL: yes  PATIENT STATUS DURING PULL: alert, oriented, yelling in regards to abdominal pain  POST PULL SITE:  LEVEL 0  POST PULL INSTRUCTIONS GIVEN: yes  POST PULL PULSES PRESENT: bilateral pedal pulses at +2  DRESSING APPLIED: gauze and tegaderm  BEDREST BEGINS @ 0956  COMMENTS:

## 2020-10-03 NOTE — H&P (Signed)
HPI Susan Whitaker returns today to discuss catheter ablation of SVT. She is a pleasant 50 yo woman with adenosine sensitive SVT despite medical therapy. The episodes start and stop suddenly. She would like to pursue catheter ablation.      Allergies  Allergen Reactions  . Lisinopril Cough           Current Outpatient Medications  Medication Sig Dispense Refill  . acetaminophen (TYLENOL) 500 MG tablet Take 2 tablets (1,000 mg total) by mouth every 6 (six) hours as needed for mild pain or headache.    Marland Kitchen amLODipine (NORVASC) 10 MG tablet Take 1 tablet (10 mg total) by mouth daily. 90 tablet 3  . aspirin EC 81 MG tablet Take 1 tablet (81 mg total) by mouth daily.    Marland Kitchen atorvastatin (LIPITOR) 40 MG tablet Take 1 tablet (40 mg total) by mouth daily. 90 tablet 1  . canagliflozin (INVOKANA) 300 MG TABS tablet Take 1 tablet (300 mg total) by mouth daily before breakfast. 30 tablet 0  . docusate sodium (COLACE) 100 MG capsule Take 1 capsule (100 mg total) by mouth daily as needed for mild constipation.    . Dulaglutide (TRULICITY) 3 QV/9.5GL SOPN Inject 3 mg once a week 4 mL 2  . glucose blood (ACCU-CHEK GUIDE) test strip 1 each by Other route 3 (three) times daily. Use as instructed to check once daily. 100 each 12  . HYDROcodone-acetaminophen (NORCO) 5-325 MG tablet Take 1 tablet by mouth every 4 (four) hours as needed for moderate pain. 6 tablet 0  . ibuprofen (ADVIL) 800 MG tablet Take 1 tablet (800 mg total) by mouth every 8 (eight) hours as needed for moderate pain or cramping. 30 tablet 0  . losartan (COZAAR) 100 MG tablet Take 1 tablet (100 mg total) by mouth daily. 90 tablet 3  . metoprolol tartrate (LOPRESSOR) 100 MG tablet Take 1 tablet (100 mg total) by mouth 2 (two) times daily. 180 tablet 1  . ondansetron (ZOFRAN) 4 MG tablet Take 1 tablet (4 mg total) by mouth every 6 (six) hours as needed for nausea or vomiting. 12 tablet 0  . ONETOUCH DELICA LANCETS FINE MISC Use  to check blood sugar 3 times daily 100 each 3  . Polyethyl Glycol-Propyl Glycol (SYSTANE PRESERVATIVE FREE OP) Place 2 drops into both eyes 4 (four) times daily as needed (irritation, dryness).     . polyethylene glycol (MIRALAX / GLYCOLAX) 17 g packet Take 17 g by mouth daily as needed for mild constipation.    Marland Kitchen spironolactone (ALDACTONE) 100 MG tablet Take 1 tablet by mouth once daily (Patient taking differently: Take 100 mg by mouth daily.) 90 tablet 0   No current facility-administered medications for this visit.         Past Medical History:  Diagnosis Date  . Allergy    allergic rhinitis  . DM type 2 (diabetes mellitus, type 2) ([place])   . FHx: hypertension   . Gastric ulcer   . GERD (gastroesophageal reflux disease)   . H/O constipation   . H/O hemorrhoids   . H/O varicella   . History of small bowel obstruction 06/02/2019  . Hyperlipidemia   . Hypertension   . Hypokalemia 2019  . Increased BMI 06/2010  . Menorrhagia   . Primary hyperaldosteronism ([place]) 02/21/2012  . SVT (supraventricular tachycardia) ([place])    Noted 11/2011 admission  . Thrombocytopenia ([place])   . Thyroid fullness 08/2005   monitoring no meds  for followed by pcp    ROS:   All systems reviewed and negative except as noted in the HPI.        Past Surgical History:  Procedure Laterality Date  . ABDOMINAL SURGERY     65 months of age-- unsure of type of surgery  . COLONOSCOPY  04/2016   hemorrhoids--normal per pt with Dr Collene Mares  . DILATATION & CURETTAGE/HYSTEROSCOPY WITH MYOSURE N/A 02/08/2020   Procedure: DILATATION & CURETTAGE/HYSTEROSCOPY WITH MYOSURE;  Surgeon: Waymon Amato, MD;  Location: [facility];  Service: Gynecology;  Laterality: N/A;  . HEMORRHOID SURGERY  2005/2006  . INTRAUTERINE DEVICE (IUD) INSERTION N/A 02/08/2020   Procedure: INTRAUTERINE DEVICE (IUD) INSERTION UNDER ULTRASOUND GUIDANCE;  Surgeon: Waymon Amato, MD;  Location: [facility];  Service: Gynecology;  Laterality: N/A;  [place]  . LAPAROTOMY N/A 06/03/2019   Procedure: EXPLORATORY LAPAROTOMY WITH LYSIS OF ADHESIONS;  Surgeon: Jovita Kussmaul, MD;  Location: [facility];  Service: General;  Laterality: N/A;  . UTERINE FIBROID EMBOLIZATION  2010   at [facility]          Family History  Problem Relation Age of Onset  . Cancer Mother        breast  . Stroke Mother   . Cancer Maternal Grandmother        breast  . Hypertension Maternal Grandmother   . Cancer Other        breast, lung  . Hypertension Other   . Stroke Other   . Vision loss Father        some  . Heart disease Father        Rhythm disturbance  . Hypertension Sister   . Hypertension Brother   . Hypertension Brother   . Diabetes Neg Hx      Social History        Socioeconomic History  . Marital status: Single    Spouse name: Not on file  . Number of children: 2  . Years of education: Not on file  . Highest education level: Not on file  Occupational History  . Occupation: YOUTH EMPLOYMENT    Employer: [employer]  Tobacco Use  . Smoking status: Never Smoker  . Smokeless tobacco: Never Used  Vaping Use  . Vaping Use: Never used  Substance and Sexual Activity  . Alcohol use: No  . Drug use: No  . Sexual activity: Not on file  Other Topics Concern  . Not on file  Social History Narrative   Works at [employer] Strain: Not on [employer] Insecurity: Not on file  Transportation Needs: Not on file  Physical Activity: Not on file  Stress: Not on file  Social Connections: Not on file  Intimate Partner Violence: Not on file     BP 124/76   Pulse 73   Ht 5\' 3"  (1.6 m)   Wt 198 lb (89.8 kg)   SpO2 98%   BMI 35.07 kg/m   Physical Exam:  Well appearing NAD HEENT: Unremarkable Neck:  No JVD, no thyromegally Lymphatics:  No adenopathy Back:  No CVA  tenderness Lungs:  Clear HEART:  Regular rate rhythm, no murmurs, no rubs, no clicks Abd:  soft, positive bowel sounds, no organomegally, no rebound, no guarding Ext:  2 plus pulses, no edema, no cyanosis, no clubbing Skin:  No rashes no nodules Neuro:  CN II through  XII intact, motor grossly intact  EKG - NSR  Assess/Plan: 1. SVT - I have discussed the indictions/risk/benefits/goals/expectations of catheter ablation and she wishes to proceed.  2. HTN - her bp is controlled.   [name]  EP Attending  Patient seen and examined. Since her last visit. No change. We will proceed with EP study and catheter ablation of SVT.  Carleene Overlie Kenadee Gates,MD

## 2020-10-03 NOTE — Progress Notes (Signed)
   Called to patient's bedside by RN after patient reported severe abdominal pain. Upon enter the room, patient was writhing in pain reporting sharp abdominal and back pain, not well localized. Groin site looked fine without hematoma and dressing C/D/I. Patient begging for pain medication after receiving morphine and dilaudid. States fentanyl didn't work. She then felt the need to vomit and began putting her fingers down her throat to induce vomiting. Patient then had a bedpan full of NBNB emesis, mostly dinner contents. After vomiting pain went from a "20/10" down to 5/10. This is not the first time she has experienced this cycle of symptoms - escalating pain relieved with vomiting. General surgery came in following this episode and recommended CLD for now. CT A/P from earlier was without acute findings after an earlier episode of severe pain following SVT ablation this morning.    - IV zofran given  - Favor continuing prn morphine +/- dilaudid for pain if symptoms recur  - Continue CLD for now - Gen surgery to see again tomorrow. - Order placed for prn ambien as patient requested sleeping aid   Abigail Butts, PA-C 10/03/20; 6:17 PM

## 2020-10-03 NOTE — Progress Notes (Signed)
Returned from [employer], Public relations account executive present for transport, monitor remains in place, tolerated well, safety maintained

## 2020-10-03 NOTE — Progress Notes (Signed)
Pt completed 2 bottles of Omnipaque oral solution, tolerated well, CT made aware, scheduled for CT Scan at 1245

## 2020-10-03 NOTE — Significant Event (Signed)
Rapid Response Event Note   Reason for Call :  Severe abd pain  Pt is s/p ablation today. She c/o abd pain post procedure prompting a CT A/P which was negative for any acute findings.  At that time, pt was given morphine and her  Per charge RN, after eating dinner, pt began to c/o "20/10" sharp abdomen pain(radiating to back) and became tachycardia and diaphoretic. At that time, pt induced vomiting. After she vomited large amount of her dinner contents, pain decreased from 5/10.   Pt had exp lap with LOA on 06/03/19 d/t SBO. Since then, she has had one episode similar to this(d/t another SBO) which resolved by being treated medically(5/1).  Dtr at bedside and says this is not the first time this has happened and that morphine usually helps with her pain.  Initial Focused Assessment:  Pt lying in bed in no distress. Pt says her abd pain is better and that she thinks it was the beans she ate. Lungs CTA . ABD soft, non-distended. R groin and R upper chest site both WNL. Skin warm and dry.   HR-101, BP-121/72, RR-18, SpO2-100% on RA   Interventions:  Clear liquid diet Zofran IV prn-previously ordered ABD xray-Nonobstructed gas pattern. Contrast material within the colon. Calcified uterine fibroids Cardiology and CCS to bedside to see pt Plan of Care:  Pt pain resolved. ABD xray negative. Continue to monitor closely. Call RRT if further assistance needed.   Event Summary:   MD Notified: Pt seen by Cards MD and CCS PTA RRT Call Time: Arrival Time:1858 End Time:1930  Dillard Essex, RN

## 2020-10-03 NOTE — Progress Notes (Addendum)
Dr. Lovena Le to bedside, aware of pt c/o abdominal pain, abdomen soft, pt with pain with pressing on right mid side of abdomen, pt states she had normal BM yesterday, orders received, safety maintained, support given

## 2020-10-03 NOTE — Progress Notes (Signed)
Bedside X-Ray completed

## 2020-10-03 NOTE — Progress Notes (Signed)
Pt had large amount of emesis x3 around 1900. Stated she felt better after throwing up.  Idolina Primer, RN

## 2020-10-04 ENCOUNTER — Encounter (HOSPITAL_COMMUNITY): Payer: Self-pay | Admitting: Internal Medicine

## 2020-10-04 DIAGNOSIS — R1013 Epigastric pain: Secondary | ICD-10-CM | POA: Diagnosis not present

## 2020-10-04 DIAGNOSIS — Z8679 Personal history of other diseases of the circulatory system: Secondary | ICD-10-CM

## 2020-10-04 DIAGNOSIS — G8929 Other chronic pain: Secondary | ICD-10-CM | POA: Diagnosis present

## 2020-10-04 DIAGNOSIS — I10 Essential (primary) hypertension: Secondary | ICD-10-CM | POA: Diagnosis not present

## 2020-10-04 DIAGNOSIS — R109 Unspecified abdominal pain: Secondary | ICD-10-CM

## 2020-10-04 DIAGNOSIS — Z20822 Contact with and (suspected) exposure to covid-19: Secondary | ICD-10-CM | POA: Diagnosis not present

## 2020-10-04 DIAGNOSIS — E119 Type 2 diabetes mellitus without complications: Secondary | ICD-10-CM | POA: Diagnosis not present

## 2020-10-04 DIAGNOSIS — I471 Supraventricular tachycardia: Secondary | ICD-10-CM | POA: Diagnosis not present

## 2020-10-04 HISTORY — DX: Personal history of other diseases of the circulatory system: Z86.79

## 2020-10-04 HISTORY — DX: Unspecified abdominal pain: R10.9

## 2020-10-04 LAB — BASIC METABOLIC PANEL
Anion gap: 10 (ref 5–15)
BUN: 17 mg/dL (ref 6–20)
CO2: 25 mmol/L (ref 22–32)
Calcium: 9.1 mg/dL (ref 8.9–10.3)
Chloride: 99 mmol/L (ref 98–111)
Creatinine, Ser: 0.87 mg/dL (ref 0.44–1.00)
GFR, Estimated: >60 mL/min (ref >60)
Glucose, Bld: 118 mg/dL — ABNORMAL HIGH (ref 70–99)
Potassium: 4.1 mmol/L (ref 3.5–5.1)
Sodium: 134 mmol/L — ABNORMAL LOW (ref 135–145)

## 2020-10-04 LAB — MAGNESIUM: Magnesium: 2.2 mg/dL (ref 1.7–2.4)

## 2020-10-04 MED ORDER — ACETAMINOPHEN 325 MG PO TABS: 650.0000 mg | ORAL_TABLET | ORAL | Status: DC | PRN

## 2020-10-04 MED ORDER — INSULIN ASPART 100 UNIT/ML IJ SOLN: 0.0000 [IU] | Freq: Three times a day (TID) | INTRAMUSCULAR | Status: DC

## 2020-10-04 MED ORDER — METOPROLOL TARTRATE 50 MG PO TABS
50.0000 mg | ORAL_TABLET | Freq: Two times a day (BID) | ORAL | Status: DC
  Administered 2020-10-04: 50 mg via ORAL
  Filled 2020-10-04: qty 1

## 2020-10-04 MED ORDER — METOPROLOL TARTRATE 50 MG PO TABS: 50.0000 mg | ORAL_TABLET | Freq: Two times a day (BID) | ORAL | 0 refills | Status: DC

## 2020-10-04 NOTE — Progress Notes (Signed)
   Progress Note   Subjective   Doing well today, the patient denies CP or SOB.  No new concerns  Inpatient Medications    Scheduled Meds: . insulin aspart  0-9 Units Subcutaneous TID WC  . sodium chloride flush  3 mL Intravenous Q12H   Continuous Infusions: . sodium chloride     PRN Meds: sodium chloride, acetaminophen, HYDROmorphone (DILAUDID) injection, morphine, ondansetron (ZOFRAN) IV, sodium chloride flush, zolpidem   Vital Signs    Vitals:   10/03/20 1814 10/03/20 2115 10/03/20 2220 10/04/20 0554  BP: 125/73 131/63 127/73 (!) 113/91  Pulse: 96 99 (!) 31 89  Resp:  15 16 15   Temp:  98.2 F (36.8 C) 98.1 F (36.7 C) 99 F (37.2 C)  TempSrc:  Oral Oral Oral  SpO2: 100% 97% 96% 100%  Weight:      Height:        Intake/Output Summary (Last 24 hours) at 10/04/2020 0958 Last data filed at 10/03/2020 1800 Gross per 24 hour  Intake 1240 ml  Output 1900 ml  Net -660 ml   Filed Weights   10/03/20 0530  Weight: 88 kg    Telemetry    Sinus with PACs - Personally Reviewed  Physical Exam   GEN- The patient is well appearing, alert and oriented x 3 today.   Head- normocephalic, atraumatic Eyes-  Sclera clear, conjunctiva pink Ears- hearing intact Oropharynx- clear Neck- supple, Lungs-  normal work of breathing Heart- Regular rate and rhythm  GI- soft  Extremities- no clubbing, cyanosis, or edema  MS- no significant deformity or atrophy Skin- no rash or lesion Psych- euthymic mood, full affect Neuro- strength and sensation are intact   Labs    Chemistry Recent Labs  Lab 10/03/20 1300 10/04/20 0151  NA 133* 134*  K 3.8 4.1  CL 98 99  CO2 27 25  GLUCOSE 93 118*  BUN 15 17  CREATININE 0.74 0.87  CALCIUM 8.9 9.1  PROT 7.9  --   ALBUMIN 3.8  --   AST 23  --   ALT 19  --   ALKPHOS 55  --   BILITOT 0.6  --   GFRNONAA >60 >60  ANIONGAP 8 10     Hematology Recent Labs  Lab 10/03/20 1300  WBC 8.6  RBC 4.59  HGB 13.5  HCT 41.3  MCV 90.0   MCH 29.4  MCHC 32.7  RDW 13.4  PLT 402*     Patient ID  Susan Whitaker is a 50 y.o. female who is admitted following SVT ablation yesterday.  She had sev  vere abdominal pain of unclear etiology which has resolved.  Assessment & Plan    1.  SVT Doing well s/p ablation Reduce metoprolol to 50mg   BID x 2 weeks then 25mg  BID x 2 weeks, then discontinue  2. Abdominal pain Intermittent, currently resolved.  Dr Marcello Moores is the the room with me.  Exam is benign.  Dr Marcello Moores (surgeon) has advised upper GI with small bowel follow through as an outpatient.  Pt prefers to have this done with Dr Collene Mares after discharge rather than while here.  Dr Marcello Moores is ok with this.  Patient is clear that she wishes to go home.  Daughter confirms that they prefer outpatient rather than further inpatient eval.  She has follow-up already with Dr Marlou Starks in June.  Follow-up with Dr Lovena Le in 4 weeks.   Thompson Grayer MD, [facility] 10/04/2020 9:58 AM

## 2020-10-04 NOTE — Progress Notes (Signed)
[facility] Progress Note  1 Day Post-Op  Subjective: CC:  Ms. Carime Dinkel was seen by our service earlier this month for abdominal pain and CT scan concerning for possible mesenteric volvulus with SBO. Yesterday she presented to the hospital for a scheduled cardiac ablation due to her history of SVT. After her procedure she reported abdominal pain and [facility]CS was asked to evaluate. Pain described as acute onset, sharp/stabbing, central abdominal pain with some radiation to her bilateral lower flank. Pain stopped with morphine and did not return. Denies associated fever, chills, nausea, vomiting. Patient is having flatus.  She reports that, since February, she has had several episodes of this pain.  Last night she developed acute pain and vomited once.  She feels fine this morning and is tolerating a diet The patients daughter is at bedside.  Objective: Vital signs in last 24 hours: Temp:  [98.1 F (36.7 C)-99 F (37.2 C)] 99 F (37.2 C) (05/21 0554) Pulse Rate:  [31-114] 89 (05/21 0554) Resp:  [7-25] 15 (05/21 0554) BP: (92-148)/(63-108) 113/91 (05/21 0554) SpO2:  [95 %-100 %] 100 % (05/21 0554)    Intake/Output from previous day: 05/20 0701 - 05/21 0700 In: 1240 [P.O.:1240] Out: 1900 [Urine:1400; Emesis/NG output:500] Intake/Output this shift: No intake/output data recorded.  PE: Gen:  Alert, NAD, pleasant Abd: Soft, non-tender, non-distended Skin: warm and dry, no rashes  Psych: A&Ox3    Lab Results:  CMP     Component Value Date/Time   NA 134 (L) 10/04/2020 0151   NA 138 08/21/2012 1002   K 4.1 10/04/2020 0151   K 3.5 08/21/2012 1002   CL 99 10/04/2020 0151   CL 102 08/21/2012 1002   CO2 25 10/04/2020 0151   CO2 28 08/21/2012 1002   GLUCOSE 118 (H) 10/04/2020 0151   GLUCOSE 132 (H) 08/21/2012 1002   BUN 17 10/04/2020 0151   BUN 16.7 08/21/2012 1002   CREATININE 0.87 10/04/2020 0151   CREATININE 0.83 12/03/2013 1112   CREATININE 1.0 08/21/2012 1002    CALCIUM 9.1 10/04/2020 0151   CALCIUM 8.9 08/21/2012 1002   PROT 7.9 10/03/2020 1300   PROT 7.6 08/21/2012 1002   ALBUMIN 3.8 10/03/2020 1300   ALBUMIN 3.3 (L) 08/21/2012 1002   AST 23 10/03/2020 1300   AST 10 08/21/2012 1002   ALT 19 10/03/2020 1300   ALT 8 08/21/2012 1002   ALKPHOS 55 10/03/2020 1300   ALKPHOS 60 08/21/2012 1002   BILITOT 0.6 10/03/2020 1300   BILITOT 0.22 08/21/2012 1002   GFRNONAA >60 10/04/2020 0151   GFRNONAA 86 12/03/2013 1112   GFRAA >60 02/05/2020 1017   GFRAA >89 12/03/2013 1112   Lipase     Component Value Date/Time   LIPASE 30 10/03/2020 1300   Studies/Results: DG Abd 1 View  Result Date: 10/03/2020 CLINICAL DATA:  Abdominal pain EXAM: ABDOMEN - 1 VIEW COMPARISON:  06/03/2019 FINDINGS: Mild air-filled colon without definitive obstructive pattern. Calcified uterine fibroids. IUD in the pelvis IMPRESSION: Nonobstructed gas pattern with air-filled colon. Electronically Signed   By: Donavan Foil M.D.   On: 10/03/2020 19:35   DG Abd 1 View  Result Date: 10/03/2020 CLINICAL DATA:  Abdominal pain with vomiting EXAM: ABDOMEN - 1 VIEW COMPARISON:  10/03/2020, CT 10/03/2020 FINDINGS: Mild gas-filled colon without definitive obstruction. Enteral contrast is present within the right colon. Contrast within the urinary bladder. IUD in the pelvis. Calcified uterine fibroids. IMPRESSION: Nonobstructed gas pattern. Contrast material within the colon. Calcified uterine fibroids Electronically  Signed   By: Donavan Foil M.D.   On: 10/03/2020 19:34   CT ABDOMEN PELVIS W CONTRAST  Result Date: 10/03/2020 CLINICAL DATA:  Abdominal pain. Nonlocalized small-bowel obstruction volvulus. EXAM: CT ABDOMEN AND PELVIS WITH CONTRAST TECHNIQUE: Multidetector CT imaging of the abdomen and pelvis was performed using the standard protocol following bolus administration of intravenous contrast. CONTRAST:  3mL OMNIPAQUE IOHEXOL 350 MG/ML SOLN COMPARISON:  09/14/2020.  06/21/2020.   06/02/2019. FINDINGS: Lower chest: Minimal atelectasis at the lung bases. No pleural effusion. Hepatobiliary: Liver parenchyma appears normal. No calcified gallstones. Pancreas: Normal Spleen: Normal Adrenals/Urinary Tract: Adrenal glands are normal. Left kidney is normal. Fullness of the right renal collecting system and proximal ureter. No obstructing lesion is identified. No bladder abnormality. Stomach/Bowel: No bowel obstruction. Stomach appears normal. Slight rotation of the mesentery as has been seen to varying degrees since February of this year. I doubt the significance of this finding. No sign of small bowel obstruction. Colon appears normal. Moderate amount of stool and gas within the colon. Vascular/Lymphatic: Aortic atherosclerosis. No aneurysm. IVC is normal. No retroperitoneal adenopathy. Reproductive: IUD present within the uterus. Multiple large leiomyomas with varying degrees of calcification as seen previously. No evidence of adnexal pathology. Other: No free fluid or air. Musculoskeletal: Minimal lumbar degenerative changes. No acute finding. IMPRESSION: No acute or significant abdominal finding. Today's study shows slight rotation of the mesentery, as has been seen to varying degrees since February of this year. I doubt the significance of this finding. There is no evidence of bowel obstruction or bowel wall edema. Mild fullness of the right renal collecting system and proximal ureter without apparent obstructing lesion. Significance is uncertain. Aortic Atherosclerosis (ICD10-I70.0). Electronically Signed   By: Nelson Chimes M.D.   On: 10/03/2020 14:24   EP STUDY  Result Date: 10/03/2020 CONCLUSIONS: 1. Sinus rhythm upon presentation. 2. The patient had dual AV nodal physiology with easily inducible classic AV nodal reentrant tachycardia. In addition, there was inducible atrial tachycardia located in the superior anterolateral region of the RA. 3. Successful radiofrequency modification of  the slow AV nodal pathway and the high lateral RA 4. No inducible arrhythmias following ablation. 5. No early apparent complications. Carleene Overlie Taylor,MD 10/03/2020 9:26 AM   Anti-infectives: Anti-infectives (From admission, onward)   None     Assessment/Plan Hx of SVT s/p catheter ablation of SVT today 5/20  HTN HLD T2DM Obesity class II - BMI 35.07 Uterine fibroids GERD Primary hyperaldosteronism   Abdominal pain - history of possible mesenteric volvulus and resulting SBO 5/1 that resolved with non-operative management  - Hx of SBO requiring exploratory laparotomy with LOA 06/03/19 Dr. Marlou Starks, Hx of unknown abdominal surgery at 57 months of age  - CBC, CMP, lactate, are all unremarkable. CT of the abdomen without signs of volvulus or obstruction. Pain now resolved. No acute surgical needs. Stable for discharge from a [facility] Perspective. Has follow up in our clinic with Dr. Marlou Starks in June.  Would consider UGI with SBFT to continue her workup as an outpatient   LOS: 1 day    Rosario Adie, MD  [facility]

## 2020-10-04 NOTE — Progress Notes (Signed)
Pt vss. No acute events during shift. No complaints of pain or N/V.  Family at bedside.

## 2020-10-04 NOTE — Discharge Instructions (Signed)
We have decreased your metoprolol to 50 mg twice a day for 2 weeks then 25 mg (half a 50 mg tab) twice a day for 2 weeks then stop.  New prescription sent to pharmacy for 50 mg tabs.   Cardiac Ablation, Care After  This sheet gives you information about how to care for yourself after your procedure. Your health care provider may also give you more specific instructions. If you have problems or questions, contact your health care provider. What can I expect after the procedure? After the procedure, it is common to have:  Bruising around your puncture site.  Tenderness around your puncture site.  Skipped heartbeats.  Tiredness (fatigue).  Follow these instructions at home: Puncture site care   Follow instructions from your health care provider about how to take care of your puncture site. Make sure you: ? If present, leave stitches (sutures), skin glue, or adhesive strips in place. These skin closures may need to stay in place for up to 2 weeks. If adhesive strip edges start to loosen and curl up, you may trim the loose edges. Do not remove adhesive strips completely unless your health care provider tells you to do that. ? If a large square bandage is present, this may be removed 24 hours after surgery.   Check your puncture site every day for signs of infection. Check for: ? Redness, swelling, or pain. ? Fluid or blood. If your puncture site starts to bleed, lie down on your back, apply firm pressure to the area, and contact your health care provider. ? Warmth. ? Pus or a bad smell. Driving  Do not drive for at least 4 days after your procedure or however long your health care provider recommends. (Do not resume driving if you have previously been instructed not to drive for other health reasons.)  Do not drive or use heavy machinery while taking prescription pain medicine. Activity  Avoid activities that take a lot of effort for at least 7 days after your procedure.  Do not lift  anything that is heavier than 5 lb (4.5 kg) for one week.   No sexual activity for 1 week.   Return to your normal activities as told by your health care provider. Ask your health care provider what activities are safe for you. General instructions  Take over-the-counter and prescription medicines only as told by your health care provider.  Do not use any products that contain nicotine or tobacco, such as cigarettes and e-cigarettes. If you need help quitting, ask your health care provider.  You may shower after 24 hours, but Do not take baths, swim, or use a hot tub for 1 week.   Do not drink alcohol for 24 hours after your procedure.  Keep all follow-up visits as told by your health care provider. This is important. Contact a health care provider if:  You have redness, mild swelling, or pain around your puncture site.  You have fluid or blood coming from your puncture site that stops after applying firm pressure to the area.  Your puncture site feels warm to the touch.  You have pus or a bad smell coming from your puncture site.  You have a fever.  You have chest pain or discomfort that spreads to your neck, jaw, or arm.  You are sweating a lot.  You feel nauseous.  You have a fast or irregular heartbeat.  You have shortness of breath.  You are dizzy or light-headed and feel the need to  lie down.  You have pain or numbness in the arm or leg closest to your puncture site. Get help right away if:  Your puncture site suddenly swells.  Your puncture site is bleeding and the bleeding does not stop after applying firm pressure to the area. These symptoms may represent a serious problem that is an emergency. Do not wait to see if the symptoms will go away. Get medical help right away. Call your local emergency services (911 in the [place] Do not drive yourself to the hospital. Summary  After the procedure, it is normal to have bruising and tenderness at the puncture site in  your groin, neck, or forearm.  Check your puncture site every day for signs of infection.  Get help right away if your puncture site is bleeding and the bleeding does not stop after applying firm pressure to the area. This is a medical emergency. This information is not intended to replace advice given to you by your health care provider. Make sure you discuss any questions you have with your health care provider.

## 2020-10-04 NOTE — Plan of Care (Signed)

## 2020-10-04 NOTE — Discharge Summary (Signed)
Discharge Summary    Patient ID: Susan Whitaker MRN: [id number]; DOB: June 11, 1970  Admit date: 10/03/2020 Discharge date: 10/04/2020  PCP:  Flossie Buffy, NP   [facility] Providers Cardiologist:  Dorris Carnes, MD  Electrophysiologist:  Cristopher Peru, MD       Discharge Diagnoses    Principal Problem:   SVT (supraventricular tachycardia) [facility]) Active Problems:   S/P radiofrequency ablation operation for arrhythmia 10/03/20   Abdominal pain    Diagnostic Studies/Procedures    10/03/20 SVT ablation  CONCLUSIONS:  1. Sinus rhythm upon presentation.  2. The patient had dual AV nodal physiology with easily inducible classic AV nodal reentrant tachycardia. In addition, there was inducible atrial tachycardia located in the superior anterolateral region of the RA.  3. Successful radiofrequency modification of the slow AV nodal pathway and the high lateral RA 4. No inducible arrhythmias following ablation.  5. No early apparent complications.   Susan Whitaker 10/03/2020 9:26 AM  _____________   History of Present Illness     Susan Whitaker is a 50 y.o. female with hx HTN and SVT -adenosine sensitive with continued occurrence despite medical therapy.  She has hx of HTN and DM-2 as well.   Pt was seen by Dr. Lovena Le in office and plans made for SVT ablation.     Prior to this admit she was seen in ER for abd pain, with vomiting.  She does have a hx of SBO. CT of abd revealed Mesenteric volvulus within the mid abdomen with subsequent early small-bowel obstruction.  Surgery team did see and the volvulus resolved spontaneously.  She was discharged 09/17/20.  Pt presented for elective SVT ablation 10/03/20.  She had EP study and ablation of SVT without complications.     Hospital Course     Consultants: General Surgery   Pt was recovering from ablation and developed abd pain with vomiting.  Dr. Lovena Le saw the pt and surgery was called. CT abd without acute or significant abdominal  fing.  No bowel obstruction or bowel wall edema.  Pt did have another episode the night of 10/03/20.  Symptoms resolved with morphine   Today pt has been seen and evaluated by Dr. Rayann Heman and Dr. Marcello Moores.  Both found pt stable and ready for discharge.  Plan for eval next week with Dr. Collene Mares to consider UGI and SBFT to further eval.    From cardiology perspective she is stable.  Reduce metoprolol to 50mg   BID x 2 weeks then 25mg  BID x 2 weeks, then discontinue.  Did the patient have an acute coronary syndrome (MI, NSTEMI, STEMI, etc) this admission?:  No                               Did the patient have a percutaneous coronary intervention (stent / angioplasty)?:  No.       _____________  Discharge Vitals Blood pressure (!) 113/91, pulse 89, temperature 99 F (37.2 C), temperature source Oral, resp. rate 15, height 5\' 3"  (1.6 m), weight 88 kg, last menstrual period 09/03/2020, SpO2 100 %.  Filed Weights   10/03/20 0530  Weight: 88 kg    Labs & Radiologic Studies    CBC Recent Labs    10/03/20 1300  WBC 8.6  HGB 13.5  HCT 41.3  MCV 90.0  PLT [id number]   Basic Metabolic Panel Recent Labs    10/03/20 1300 10/04/20 0151  NA 133* 134*  K 3.8 4.1  CL 98 99  CO2 27 25  GLUCOSE 93 118*  BUN 15 17  CREATININE 0.74 0.87  CALCIUM 8.9 9.1  MG  --  2.2   Liver Function Tests Recent Labs    10/03/20 1300  AST 23  ALT 19  ALKPHOS 55  BILITOT 0.6  PROT 7.9  ALBUMIN 3.8   Recent Labs    10/03/20 1300  LIPASE 30   High Sensitivity Troponin:   No results for input(s): TROPONINIHS in the last 720 hours.  BNP Invalid input(s): POCBNP D-Dimer No results for input(s): DDIMER in the last 72 hours. Hemoglobin A1C No results for input(s): HGBA1C in the last 72 hours. Fasting Lipid Panel No results for input(s): CHOL, HDL, LDLCALC, TRIG, CHOLHDL, LDLDIRECT in the last 72 hours. Thyroid Function Tests No results for input(s): TSH, T4TOTAL, T3FREE, THYROIDAB in the last 72  hours.  Invalid input(s): FREET3 _____________  Darletta Moll Abd 1 View  Result Date: 10/03/2020 CLINICAL DATA:  Abdominal pain EXAM: ABDOMEN - 1 VIEW COMPARISON:  06/03/2019 FINDINGS: Mild air-filled colon without definitive obstructive pattern. Calcified uterine fibroids. IUD in the pelvis IMPRESSION: Nonobstructed gas pattern with air-filled colon. Electronically Signed   By: Donavan Foil M.D.   On: 10/03/2020 19:35   DG Abd 1 View  Result Date: 10/03/2020 CLINICAL DATA:  Abdominal pain with vomiting EXAM: ABDOMEN - 1 VIEW COMPARISON:  10/03/2020, CT 10/03/2020 FINDINGS: Mild gas-filled colon without definitive obstruction. Enteral contrast is present within the right colon. Contrast within the urinary bladder. IUD in the pelvis. Calcified uterine fibroids. IMPRESSION: Nonobstructed gas pattern. Contrast material within the colon. Calcified uterine fibroids Electronically Signed   By: Donavan Foil M.D.   On: 10/03/2020 19:34   CT ABDOMEN PELVIS W CONTRAST  Result Date: 10/03/2020 CLINICAL DATA:  Abdominal pain. Nonlocalized small-bowel obstruction volvulus. EXAM: CT ABDOMEN AND PELVIS WITH CONTRAST TECHNIQUE: Multidetector CT imaging of the abdomen and pelvis was performed using the standard protocol following bolus administration of intravenous contrast. CONTRAST:  36mL OMNIPAQUE IOHEXOL 350 MG/ML SOLN COMPARISON:  09/14/2020.  06/21/2020.  06/02/2019. FINDINGS: Lower chest: Minimal atelectasis at the lung bases. No pleural effusion. Hepatobiliary: Liver parenchyma appears normal. No calcified gallstones. Pancreas: Normal Spleen: Normal Adrenals/Urinary Tract: Adrenal glands are normal. Left kidney is normal. Fullness of the right renal collecting system and proximal ureter. No obstructing lesion is identified. No bladder abnormality. Stomach/Bowel: No bowel obstruction. Stomach appears normal. Slight rotation of the mesentery as has been seen to varying degrees since February of this year. I doubt the  significance of this finding. No sign of small bowel obstruction. Colon appears normal. Moderate amount of stool and gas within the colon. Vascular/Lymphatic: Aortic atherosclerosis. No aneurysm. IVC is normal. No retroperitoneal adenopathy. Reproductive: IUD present within the uterus. Multiple large leiomyomas with varying degrees of calcification as seen previously. No evidence of adnexal pathology. Other: No free fluid or air. Musculoskeletal: Minimal lumbar degenerative changes. No acute finding. IMPRESSION: No acute or significant abdominal finding. Today's study shows slight rotation of the mesentery, as has been seen to varying degrees since February of this year. I doubt the significance of this finding. There is no evidence of bowel obstruction or bowel wall edema. Mild fullness of the right renal collecting system and proximal ureter without apparent obstructing lesion. Significance is uncertain. Aortic Atherosclerosis (ICD10-I70.0). Electronically Signed   By: Nelson Chimes M.D.   On: 10/03/2020 14:24   CT ABDOMEN PELVIS W CONTRAST  Result Date:  09/14/2020 CLINICAL DATA:  Abdominal pain and fever. EXAM: CT ABDOMEN AND PELVIS WITH CONTRAST TECHNIQUE: Multidetector CT imaging of the abdomen and pelvis was performed using the standard protocol following bolus administration of intravenous contrast. CONTRAST:  123mL OMNIPAQUE IOHEXOL 300 MG/ML  SOLN COMPARISON:  June 21, 2020 FINDINGS: Lower chest: No acute abnormality. Hepatobiliary: No focal liver abnormality is seen. No gallstones, gallbladder wall thickening, or biliary dilatation. Pancreas: Unremarkable. No pancreatic ductal dilatation or surrounding inflammatory changes. Spleen: Normal in size without focal abnormality. Adrenals/Urinary Tract: Adrenal glands are unremarkable. Kidneys are normal, without renal calculi, focal lesion, or hydronephrosis. Bladder is unremarkable. Stomach/Bowel: Stomach is within normal limits. The appendix is not  clearly identified. Twisting of the mesentery is seen within the mid abdomen, to the right of midline (axial CT images 35 through 44, CT series number 2. Mildly prominent small bowel loops are also seen within this region with a transition zone seen extending into the previously noted area of twisted mesentery (axial CT images 39 through 45). Vascular/Lymphatic: Aortic atherosclerosis. No enlarged abdominal or pelvic lymph nodes. Reproductive: Partially calcified and non calcified uterine fibroids are again seen. An IUD is in place. The bilateral adnexa are unremarkable. Other: No abdominal wall hernia or abnormality. No abdominopelvic ascites. Musculoskeletal: No acute or significant osseous findings. IMPRESSION: Mesenteric volvulus within the mid abdomen with subsequent early small-bowel obstruction. Electronically Signed   By: Virgina Norfolk M.D.   On: 09/14/2020 23:14   EP STUDY  Result Date: 10/03/2020 CONCLUSIONS: 1. Sinus rhythm upon presentation. 2. The patient had dual AV nodal physiology with easily inducible classic AV nodal reentrant tachycardia. In addition, there was inducible atrial tachycardia located in the superior anterolateral region of the RA. 3. Successful radiofrequency modification of the slow AV nodal pathway and the high lateral RA 4. No inducible arrhythmias following ablation. 5. No early apparent complications. Susan Whitaker 10/03/2020 9:26 AM   Disposition   Pt is being discharged home today in good condition.  Follow-up Plans & Appointments   We have decreased your metoprolol to 50 mg twice a day for 2 weeks then 25 mg (half a 50 mg tab) twice a day for 2 weeks then stop.  New prescription sent to pharmacy for 50 mg tabs.   Follow-up Information    Evans Lance, MD Follow up.   Specialty: Cardiology Why: on 6/22 at 0830 for post ablation follow up Contact information: [address] [address] [phone]        Juanita Craver,  MD Follow up.   Specialty: Gastroenterology Why: call Monday for appointment  Contact information: [address], [place] [address] [phone]        Jovita Kussmaul, MD Follow up.   Specialty: General Surgery Why: call his office Monday for appt date and time. Contact information: [address] [place] [address] [phone]                Discharge Medications   Allergies as of 10/04/2020      Reactions   Lisinopril Cough      Medication List    TAKE these medications   acetaminophen 325 MG tablet Commonly known as: TYLENOL Take 2 tablets (650 mg total) by mouth every 4 (four) hours as needed for headache or mild pain.   amLODipine 10 MG tablet Commonly known as: NORVASC Take 1 tablet (10 mg total) by mouth daily.   aspirin EC  81 MG tablet Take 1 tablet (81 mg total) by mouth daily.   atorvastatin 40 MG tablet Commonly known as: LIPITOR Take 1 tablet (40 mg total) by mouth daily.   glucose blood test strip Commonly known as: Accu-Chek Guide 1 each by Other route 3 (three) times daily. Use as instructed to check once daily.   Invokana 300 MG Tabs tablet Generic drug: canagliflozin TAKE 1 TABLET BY MOUTH ONCE DAILY BEFORE BREAKFAST (NEEDS  APPOINTMENT  FOR  MORE  REFILLS) What changed: See the new instructions.   losartan 100 MG tablet Commonly known as: COZAAR Take 1 tablet (100 mg total) by mouth daily.   metoprolol tartrate 50 MG tablet Commonly known as: LOPRESSOR Take 1 tablet (50 mg total) by mouth 2 (two) times daily. What changed:   medication strength  how much to take   ondansetron 4 MG/2ML Soln injection Commonly known as: ZOFRAN Inject 2 mLs (4 mg total) into the vein every 6 (six) hours as needed for nausea.   OneTouch Delica Lancets Fine Misc Use to check blood sugar 3 times daily   polyethylene glycol 17 g packet Commonly known as: MIRALAX / GLYCOLAX Take 17 g by mouth once a week.    spironolactone 100 MG tablet Commonly known as: ALDACTONE Take 1 tablet (100 mg total) by mouth daily.   Trulicity 3 QJ/1.9ER Sopn Generic drug: Dulaglutide Inject 3 mg once a week What changed:   how much to take  how to take this  when to take this  additional instructions   ULTRAFLORA IMMUNE HEALTH PO Take 1 capsule by mouth daily.          Outstanding Labs/Studies   none  Duration of Discharge Encounter   Greater than 30 minutes including physician time.  Signed, Cecilie Kicks, NP 10/04/2020, 11:02 AM

## 2020-10-06 MED FILL — Fentanyl Citrate Preservative Free (PF) Inj 100 MCG/2ML: INTRAMUSCULAR | Qty: 2 | Status: AC

## 2020-10-06 NOTE — Assessment & Plan Note (Signed)
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 1st occurrence at age 6months which led to GI surery (unable to get pediatric record from [place]). 2nd incidence 2021. All symptoms spontaneously resolved during recent hospitalization. today she reports BM daily with use of probiotics, No ABD pain, no N/V Gen, surgery appt with Dr. Carolynne Edouard: 10/2020 Has not scheduled appt with Dr. Loreta Ave (GI)  Repeat cbc and CMP today Maintain current medications

## 2020-10-06 NOTE — Assessment & Plan Note (Signed)
Hx of recurrent volvulus, this is the 3rd episode. 1st occurrence at age 13months which led to GI surery (unable to get pediatric record from [place]). 2nd incidence 2021. All symptoms spontaneously resolved during recent hospitalization. today she reports BM daily with use of probiotics, No ABD pain, no N/V Gen, surgery appt with Dr. Marlou Starks: 10/2020 Has not scheduled appt with Dr. Collene Mares (GI)  Repeat cbc and CMP today Maintain current medications

## 2020-10-09 ENCOUNTER — Encounter: Payer: Self-pay | Admitting: Nurse Practitioner

## 2020-10-09 ENCOUNTER — Other Ambulatory Visit: Payer: Self-pay | Admitting: Gastroenterology

## 2020-10-09 DIAGNOSIS — Z8719 Personal history of other diseases of the digestive system: Secondary | ICD-10-CM

## 2020-10-09 DIAGNOSIS — R109 Unspecified abdominal pain: Secondary | ICD-10-CM

## 2020-10-20 ENCOUNTER — Other Ambulatory Visit: Payer: Self-pay

## 2020-10-20 DIAGNOSIS — K5904 Chronic idiopathic constipation: Secondary | ICD-10-CM

## 2020-10-24 ENCOUNTER — Other Ambulatory Visit: Payer: Self-pay | Admitting: Endocrinology

## 2020-10-24 DIAGNOSIS — E1169 Type 2 diabetes mellitus with other specified complication: Secondary | ICD-10-CM

## 2020-10-27 ENCOUNTER — Other Ambulatory Visit: Payer: Self-pay

## 2020-10-27 ENCOUNTER — Ambulatory Visit
Admission: RE | Admit: 2020-10-27 | Discharge: 2020-10-27 | Disposition: A | Discharge status: Home / Self Care | Payer: 59 | Source: Ambulatory Visit | Attending: Gastroenterology | Admitting: Gastroenterology

## 2020-10-27 DIAGNOSIS — Z8719 Personal history of other diseases of the digestive system: Secondary | ICD-10-CM

## 2020-10-27 DIAGNOSIS — R109 Unspecified abdominal pain: Secondary | ICD-10-CM

## 2020-10-27 MED ORDER — IOPAMIDOL (ISOVUE-300) INJECTION 61%
100.0000 mL | Freq: Once | INTRAVENOUS | Status: AC | PRN
  Administered 2020-10-27: 100 mL via INTRAVENOUS

## 2020-11-05 ENCOUNTER — Ambulatory Visit: Payer: 59 | Admitting: Internal Medicine

## 2020-11-09 ENCOUNTER — Other Ambulatory Visit: Payer: Self-pay | Admitting: Endocrinology

## 2020-11-09 DIAGNOSIS — E1169 Type 2 diabetes mellitus with other specified complication: Secondary | ICD-10-CM

## 2020-11-17 ENCOUNTER — Other Ambulatory Visit: Payer: Self-pay

## 2020-11-17 ENCOUNTER — Emergency Department (HOSPITAL_BASED_OUTPATIENT_CLINIC_OR_DEPARTMENT_OTHER): Payer: 59

## 2020-11-17 ENCOUNTER — Encounter (HOSPITAL_BASED_OUTPATIENT_CLINIC_OR_DEPARTMENT_OTHER): Payer: Self-pay | Admitting: *Deleted

## 2020-11-17 ENCOUNTER — Inpatient Hospital Stay (HOSPITAL_BASED_OUTPATIENT_CLINIC_OR_DEPARTMENT_OTHER)
Admission: EM | Admit: 2020-11-17 | Discharge: 2020-11-22 | DRG: 390 | Disposition: A | Discharge status: Home / Self Care | Payer: 59 | Attending: Internal Medicine | Admitting: Internal Medicine

## 2020-11-17 DIAGNOSIS — I471 Supraventricular tachycardia, unspecified: Secondary | ICD-10-CM | POA: Diagnosis present

## 2020-11-17 DIAGNOSIS — K56600 Partial intestinal obstruction, unspecified as to cause: Secondary | ICD-10-CM

## 2020-11-17 DIAGNOSIS — E1169 Type 2 diabetes mellitus with other specified complication: Secondary | ICD-10-CM

## 2020-11-17 DIAGNOSIS — Z6833 Body mass index (BMI) 33.0-33.9, adult: Secondary | ICD-10-CM | POA: Diagnosis not present

## 2020-11-17 DIAGNOSIS — E669 Obesity, unspecified: Secondary | ICD-10-CM | POA: Diagnosis present

## 2020-11-17 DIAGNOSIS — K219 Gastro-esophageal reflux disease without esophagitis: Secondary | ICD-10-CM | POA: Diagnosis present

## 2020-11-17 DIAGNOSIS — Z8249 Family history of ischemic heart disease and other diseases of the circulatory system: Secondary | ICD-10-CM

## 2020-11-17 DIAGNOSIS — E785 Hyperlipidemia, unspecified: Secondary | ICD-10-CM | POA: Diagnosis present

## 2020-11-17 DIAGNOSIS — Z8711 Personal history of peptic ulcer disease: Secondary | ICD-10-CM | POA: Diagnosis not present

## 2020-11-17 DIAGNOSIS — Z79899 Other long term (current) drug therapy: Secondary | ICD-10-CM

## 2020-11-17 DIAGNOSIS — Z823 Family history of stroke: Secondary | ICD-10-CM | POA: Diagnosis not present

## 2020-11-17 DIAGNOSIS — E2609 Other primary hyperaldosteronism: Secondary | ICD-10-CM | POA: Diagnosis present

## 2020-11-17 DIAGNOSIS — Z821 Family history of blindness and visual loss: Secondary | ICD-10-CM | POA: Diagnosis not present

## 2020-11-17 DIAGNOSIS — Z888 Allergy status to other drugs, medicaments and biological substances status: Secondary | ICD-10-CM

## 2020-11-17 DIAGNOSIS — Z7982 Long term (current) use of aspirin: Secondary | ICD-10-CM | POA: Diagnosis not present

## 2020-11-17 DIAGNOSIS — Z803 Family history of malignant neoplasm of breast: Secondary | ICD-10-CM | POA: Diagnosis not present

## 2020-11-17 DIAGNOSIS — Z20822 Contact with and (suspected) exposure to covid-19: Secondary | ICD-10-CM | POA: Diagnosis present

## 2020-11-17 DIAGNOSIS — I10 Essential (primary) hypertension: Secondary | ICD-10-CM | POA: Diagnosis present

## 2020-11-17 DIAGNOSIS — R1084 Generalized abdominal pain: Secondary | ICD-10-CM

## 2020-11-17 DIAGNOSIS — Z9889 Other specified postprocedural states: Secondary | ICD-10-CM

## 2020-11-17 DIAGNOSIS — E119 Type 2 diabetes mellitus without complications: Secondary | ICD-10-CM

## 2020-11-17 DIAGNOSIS — K5651 Intestinal adhesions [bands], with partial obstruction: Secondary | ICD-10-CM | POA: Diagnosis not present

## 2020-11-17 DIAGNOSIS — Z8719 Personal history of other diseases of the digestive system: Secondary | ICD-10-CM | POA: Diagnosis present

## 2020-11-17 DIAGNOSIS — Z8679 Personal history of other diseases of the circulatory system: Secondary | ICD-10-CM | POA: Diagnosis not present

## 2020-11-17 DIAGNOSIS — Z4659 Encounter for fitting and adjustment of other gastrointestinal appliance and device: Secondary | ICD-10-CM

## 2020-11-17 DIAGNOSIS — K56609 Unspecified intestinal obstruction, unspecified as to partial versus complete obstruction: Secondary | ICD-10-CM

## 2020-11-17 HISTORY — DX: Type 2 diabetes mellitus with other specified complication: E66.9

## 2020-11-17 LAB — COMPREHENSIVE METABOLIC PANEL
ALT: 16 U/L (ref 0–44)
AST: 20 U/L (ref 15–41)
Albumin: 4.2 g/dL (ref 3.5–5.0)
Alkaline Phosphatase: 54 U/L (ref 38–126)
Anion gap: 9 (ref 5–15)
BUN: 23 mg/dL — ABNORMAL HIGH (ref 6–20)
CO2: 26 mmol/L (ref 22–32)
Calcium: 9.1 mg/dL (ref 8.9–10.3)
Chloride: 102 mmol/L (ref 98–111)
Creatinine, Ser: 0.84 mg/dL (ref 0.44–1.00)
GFR, Estimated: >60 mL/min (ref >60)
Glucose, Bld: 135 mg/dL — ABNORMAL HIGH (ref 70–99)
Potassium: 3.9 mmol/L (ref 3.5–5.1)
Sodium: 137 mmol/L (ref 135–145)
Total Bilirubin: 0.4 mg/dL (ref 0.3–1.2)
Total Protein: 8.5 g/dL — ABNORMAL HIGH (ref 6.5–8.1)

## 2020-11-17 LAB — CBC WITH DIFFERENTIAL/PLATELET
Abs Immature Granulocytes: 0.02 10*3/uL (ref 0.00–0.07)
Basophils Absolute: 0 10*3/uL (ref 0.0–0.1)
Basophils Relative: 0 %
Eosinophils Absolute: 0.2 10*3/uL (ref 0.0–0.5)
Eosinophils Relative: 2 %
HCT: 39.7 % (ref 36.0–46.0)
Hemoglobin: 13.3 g/dL (ref 12.0–15.0)
Immature Granulocytes: 0 %
Lymphocytes Relative: 41 %
Lymphs Abs: 3.6 10*3/uL (ref 0.7–4.0)
MCH: 29.4 pg (ref 26.0–34.0)
MCHC: 33.5 g/dL (ref 30.0–36.0)
MCV: 87.6 fL (ref 80.0–100.0)
Monocytes Absolute: 0.6 10*3/uL (ref 0.1–1.0)
Monocytes Relative: 6 %
Neutro Abs: 4.5 10*3/uL (ref 1.7–7.7)
Neutrophils Relative %: 51 %
Platelets: 454 10*3/uL — ABNORMAL HIGH (ref 150–400)
RBC: 4.53 MIL/uL (ref 3.87–5.11)
RDW: 13.3 % (ref 11.5–15.5)
WBC: 8.8 10*3/uL (ref 4.0–10.5)
nRBC: 0 % (ref 0.0–0.2)

## 2020-11-17 LAB — LIPASE, BLOOD: Lipase: 35 U/L (ref 11–51)

## 2020-11-17 LAB — RESP PANEL BY RT-PCR (FLU A&B, COVID) ARPGX2
Influenza A by PCR: NEGATIVE
Influenza B by PCR: NEGATIVE
SARS Coronavirus 2 by RT PCR: NEGATIVE

## 2020-11-17 MED ORDER — ONDANSETRON HCL 4 MG/2ML IJ SOLN
4.0000 mg | Freq: Once | INTRAMUSCULAR | Status: AC
  Administered 2020-11-17: 4 mg via INTRAVENOUS
  Filled 2020-11-17: qty 2

## 2020-11-17 MED ORDER — IOHEXOL 300 MG/ML  SOLN
100.0000 mL | Freq: Once | INTRAMUSCULAR | Status: AC | PRN
  Administered 2020-11-17: 100 mL via INTRAVENOUS

## 2020-11-17 MED ORDER — ONDANSETRON HCL 4 MG/2ML IJ SOLN
4.0000 mg | Freq: Four times a day (QID) | INTRAMUSCULAR | Status: DC | PRN
  Administered 2020-11-19 – 2020-11-20 (×2): 4 mg via INTRAVENOUS
  Filled 2020-11-17 (×2): qty 2

## 2020-11-17 MED ORDER — LACTATED RINGERS IV SOLN
INTRAVENOUS | Status: AC
  Administered 2020-11-17: 1 mL via INTRAVENOUS

## 2020-11-17 MED ORDER — MORPHINE SULFATE (PF) 2 MG/ML IV SOLN: 1.0000 mg | INTRAVENOUS | Status: DC | PRN

## 2020-11-17 MED ORDER — HYDROMORPHONE HCL 1 MG/ML IJ SOLN
1.0000 mg | Freq: Once | INTRAMUSCULAR | Status: AC
  Administered 2020-11-17: 1 mg via INTRAVENOUS
  Filled 2020-11-17: qty 1

## 2020-11-17 MED ORDER — HYDROMORPHONE HCL 1 MG/ML IJ SOLN
0.5000 mg | INTRAMUSCULAR | Status: DC | PRN
  Administered 2020-11-18 – 2020-11-19 (×2): 0.5 mg via INTRAVENOUS
  Filled 2020-11-17 (×2): qty 0.5

## 2020-11-17 NOTE — ED Notes (Signed)
Became nauseated after contrast pushed.  Slowly resolving no vomiting

## 2020-11-17 NOTE — ED Provider Notes (Signed)
[facility] EMERGENCY DEPARTMENT Provider Note   CSN: [id number] Arrival date & time: 11/17/20  1243     History No chief complaint on file.   Susan Whitaker is a 50 y.o. female.  HPI  Patient with significant medical history of bowel obstruction, volvulus, type 2 diabetes, gastric ulcers presents with  chief complaint of abdominal pain.  Patient states pain came on suddenly, describes it as a intense cramping-like sensation in her lower abdomen, she has associated nausea without vomiting, states she had 3 bowel movements, states she still passing flatus, she denies any urinary symptoms, denies fevers, chills, general body aches.  Patient states she has no appetite at this time, she denies alleviating factors.  States that this pain does not feel as severe as when she had her volvulus states that she has had cramping like this in the past.  Patient denies  alleviating factors.  Patient does endorse chest pain, shortness of breath, worsening pedal edema.  After reviewing patient's chart patient's had multiple bowel obstructions, most recent was in May, had a volvulus which spontaneously resolved.  Past Medical History:  Diagnosis Date   Abdominal pain 10/04/2020   Class 2 obesity due to excess calories with body mass index (BMI) of 35.0 to 35.9 in adult 06/2010   DM type 2 (diabetes mellitus, type 2) (HCC)    Gastric ulcer    GERD (gastroesophageal reflux disease)    H/O constipation    H/O hemorrhoids    History of small bowel obstruction 06/02/2019   Hyperlipidemia    Hypertension    Hypokalemia 2019   Menorrhagia    Primary hyperaldosteronism ([place]) 02/21/2012   S/P radiofrequency ablation operation for arrhythmia 10/03/20 10/04/2020   SVT (supraventricular tachycardia) ([place])    Noted 11/2011 admission   Thrombocytopenia [facility] )     Patient Active Problem List   Diagnosis Date Noted   Abdominal pain 10/04/2020   S/P radiofrequency ablation operation for arrhythmia 10/03/20  10/04/2020   Volvulus ([place]) 09/15/2020   Chronic idiopathic constipation 09/14/2020   Abdominal bloating 09/14/2020   Hemorrhage of rectum and anus 09/14/2020   Obesity (BMI 30-39.9) 09/14/2020   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   Chronic narcotic use 09/14/2020   IUD (intrauterine device) in place 09/14/2020   Abnormal uterine bleeding due to adenomyosis 01/08/2020   Polyp of corpus uteri 01/08/2020   SBO (small bowel obstruction) ([place]) 06/02/2019   History of small bowel obstruction 06/02/2019   Vitamin D deficiency 05/26/2019   Anxiety state 07/14/2012   Primary hyperaldosteronism ([place]) 02/21/2012   Microalbuminuria 02/15/2012   Multiple thyroid nodules 01/04/2012   SVT (supraventricular tachycardia) ([place]) 12/29/2011   Hypokalemia 11/28/2011   Cervical pain (neck) 05/28/2011   Hemorrhoids 02/07/2009   Diabetes mellitus type 2 with complications ([place]) 14/78/2956   ALLERGIC RHINITIS 05/24/2008   Dyslipidemia 04/22/2008   THROMBOCYTOSIS 04/22/2008   URINARY INCONTINENCE 04/22/2008   Essential hypertension 01/04/2007   GERD 01/04/2007    Past Surgical History:  Procedure Laterality Date   ABDOMINAL ADHESION SURGERY  06/03/2019   Dr Marlou Starks   ABDOMINAL SURGERY     27 months of age-- unsure of type of surgery   COLONOSCOPY  04/2016   hemorrhoids--normal per pt with Dr Collene Mares   DILATATION & CURETTAGE/HYSTEROSCOPY WITH MYOSURE N/A 02/08/2020   Procedure: DILATATION & CURETTAGE/HYSTEROSCOPY WITH MYOSURE;  Surgeon: Waymon Amato, MD;  Location: [facility];  Service: Gynecology;  Laterality: N/A;   HEMORRHOID SURGERY  2005/2006   INTRAUTERINE  DEVICE (IUD) INSERTION N/A 02/08/2020   Procedure: INTRAUTERINE DEVICE (IUD) INSERTION UNDER ULTRASOUND GUIDANCE;  Surgeon: Waymon Amato, MD;  Location: [place];  Service: Gynecology;  Laterality: N/A;  Mirena   LAPAROTOMY N/A 06/03/2019   Procedure: EXPLORATORY LAPAROTOMY WITH LYSIS OF ADHESIONS;  Surgeon: Jovita Kussmaul,  MD;  Location: [facility];  Service: General;  Laterality: N/A;   SVT ABLATION N/A 10/03/2020   Procedure: SVT ABLATION;  Surgeon: Evans Lance, MD;  Location: [facility];  Service: Cardiovascular;  Laterality: N/A;   UTERINE FIBROID EMBOLIZATION  2010   at [facility]     OB History     Gravida  4   Para  2   Term  2   Preterm      AB  1   Living  2      SAB  1   IAB      Ectopic      Multiple      Live Births              Family History  Problem Relation Age of Onset   Cancer Mother        breast   Stroke Mother    Cancer Maternal Grandmother        breast   Hypertension Maternal Grandmother    Cancer Other        breast, lung   Hypertension Other    Stroke Other    Vision loss Father        some   Heart disease Father        Rhythm disturbance   Hypertension Sister    Hypertension Brother    Hypertension Brother    Diabetes Neg Hx     Social History   Tobacco Use   Smoking status: Never   Smokeless tobacco: Never  Vaping Use   Vaping Use: Never used  Substance Use Topics   Alcohol use: No   Drug use: No    Home Medications Prior to Admission medications   Medication Sig Start Date End Date Taking? Authorizing Provider  acetaminophen (TYLENOL) 325 MG tablet Take 2 tablets (650 mg total) by mouth every 4 (four) hours as needed for headache or mild pain. 10/04/20   Isaiah Serge, NP  amLODipine (NORVASC) 10 MG tablet Take 1 tablet (10 mg total) by mouth daily. 06/28/20   Nche, Charlene Brooke, NP  aspirin EC 81 MG tablet Take 1 tablet (81 mg total) by mouth daily. 08/11/15   Debbrah Alar, NP  atorvastatin (LIPITOR) 40 MG tablet Take 1 tablet (40 mg total) by mouth daily. 04/29/20   Debbrah Alar, NP  canagliflozin (INVOKANA) 300 MG TABS tablet Take 1 tablet (300 mg total) by mouth daily before breakfast. 10/25/20   Elayne Snare, MD  glucose blood (ACCU-CHEK GUIDE) test strip 1 each by Other route 3 (three) times daily. Use as  instructed to check once daily. 11/10/17   Elayne Snare, MD  losartan (COZAAR) 100 MG tablet Take 1 tablet (100 mg total) by mouth daily. 06/28/20   Nche, Charlene Brooke, NP  metoprolol tartrate (LOPRESSOR) 50 MG tablet Take 1 tablet (50 mg total) by mouth 2 (two) times daily. 10/04/20   Isaiah Serge, NP  ondansetron [facility]) 4 MG/2ML SOLN injection Inject 2 mLs (4 mg total) into the vein every 6 (six) hours as needed for nausea. 10/03/20   Shirley Friar, PA-C  [facility] DELICA LANCETS FINE MISC Use  to check blood sugar 3 times daily 09/28/16   Elayne Snare, MD  polyethylene glycol (MIRALAX / GLYCOLAX) 17 g packet Take 17 g by mouth once a week.    [provider]  Probiotic Product ([place]) Take 1 capsule by mouth daily.    [provider]  spironolactone (ALDACTONE) 100 MG tablet Take 1 tablet (100 mg total) by mouth daily. 09/23/20   Elayne Snare, MD  TRULICITY 3 TI/4.5YK SOPN INJECT 3 MG  ONCE A WEEK 11/10/20   Elayne Snare, MD    Allergies    Lisinopril  Review of Systems   Review of Systems  Constitutional:  Negative for chills and fever.  HENT:  Negative for congestion.   Respiratory:  Negative for shortness of breath.   Cardiovascular:  Negative for chest pain.  Gastrointestinal:  Positive for abdominal pain and nausea. Negative for constipation, diarrhea and vomiting.  Genitourinary:  Negative for enuresis.  Musculoskeletal:  Negative for back pain.  Skin:  Negative for rash.  Neurological:  Negative for dizziness.  Hematological:  Does not bruise/bleed easily.   Physical Exam Updated Vital Signs BP 121/70   Pulse 94   Temp 98.3 F (36.8 C) (Oral)   Resp 15   Ht 5\' 3"  (1.6 m)   Wt 86.2 kg   LMP 10/29/2020   SpO2 91%   BMI 33.66 kg/m   Physical Exam Vitals and nursing note reviewed.  Constitutional:      General: She is not in acute distress.    Appearance: She is not ill-appearing.  HENT:     Head: Normocephalic and  atraumatic.     Nose: No congestion.  Eyes:     Conjunctiva/sclera: Conjunctivae normal.  Cardiovascular:     Rate and Rhythm: Normal rate and regular rhythm.     Pulses: Normal pulses.     Heart sounds: No murmur heard.   No friction rub. No gallop.  Pulmonary:     Effort: No respiratory distress.     Breath sounds: No wheezing, rhonchi or rales.  Abdominal:     General: There is no distension.     Palpations: Abdomen is soft.     Tenderness: There is abdominal tenderness.     Comments: Abdomen was visualized nondistended, normal bowel sounds, dull to percussion, she had tenderness to palpation around her umbilicus, there is no guarding, rebound tenderness, peritoneal sign, negative Murphy's or McBurney point.  Musculoskeletal:     Right lower leg: No edema.     Left lower leg: No edema.  Skin:    General: Skin is warm and dry.  Neurological:     Mental Status: She is alert.  Psychiatric:        Mood and Affect: Mood normal.    ED Results / Procedures / Treatments   Labs (all labs ordered are listed, but only abnormal results are displayed) Labs Reviewed  CBC WITH DIFFERENTIAL/PLATELET - Abnormal; Notable for the following components:      Result Value   Platelets 454 (*)    All other components within normal limits  COMPREHENSIVE METABOLIC PANEL - Abnormal; Notable for the following components:   Glucose, Bld 135 (*)    BUN 23 (*)    Total Protein 8.5 (*)    All other components within normal limits  LIPASE, BLOOD    EKG None  Radiology CT ABDOMEN PELVIS W CONTRAST  Result Date: 11/17/2020 CLINICAL DATA:  Abdominal pain EXAM: CT ABDOMEN AND PELVIS  WITH CONTRAST TECHNIQUE: Multidetector CT imaging of the abdomen and pelvis was performed using the standard protocol following bolus administration of intravenous contrast. CONTRAST:  113mL OMNIPAQUE IOHEXOL 300 MG/ML  SOLN COMPARISON:  10/27/2020 FINDINGS: Lower chest: No acute abnormality. Hepatobiliary: No focal liver  abnormality is seen. No gallstones, gallbladder wall thickening, or biliary dilatation. Pancreas: Unremarkable. No pancreatic ductal dilatation or surrounding inflammatory changes. Spleen: Normal in size without focal abnormality. Adrenals/Urinary Tract: Normal adrenal glands. No kidney mass, stone or hydronephrosis. Stomach/Bowel: Normal appearance of the stomach. Colon is unremarkable. There are scattered mildly dilated loops of small bowel with air-fluid levels and intervening segments of normal caliber small bowel. For example, within the left lower quadrant of the abdomen there is a dilated small bowel loop measuring 2.2 cm, image 41/2. The terminal ileum appears increased in caliber with signs of fecalization. Proximal to the terminal ileum there is a normal caliber small bowel loops. No significant bowel wall thickening or inflammation. Vascular/Lymphatic: Aortic atherosclerosis. No aneurysm. No abdominopelvic adenopathy Reproductive: Enlarged fibroid uterus containing calcified and noncalcified fibroids no IUD is in place. No adnexal mass. Other: No free fluid or fluid collections. No signs of pneumoperitoneum. Musculoskeletal: 2 IMPRESSION: 1. There are scattered mildly dilated loops of small bowel with air-fluid levels and intervening segments of normal caliber small bowel. The terminal ileum appears increased in caliber with signs of fecalization, which can be seen with diminished small bowel transit. Findings may reflect low-grade partial small bowel obstruction in this patient who has known adhesive disease. 2. Enlarged fibroid uterus. 3. Aortic atherosclerosis. Aortic Atherosclerosis (ICD10-I70.0). Electronically Signed   By: Kerby Moors M.D.   On: 11/17/2020 15:06    Procedures Procedures   Medications Ordered in ED Medications  ondansetron (ZOFRAN) injection 4 mg (4 mg Intravenous Given 11/17/20 1425)  HYDROmorphone (DILAUDID) injection 1 mg (1 mg Intravenous Given 11/17/20 1425)  iohexol  (OMNIPAQUE) 300 MG/ML solution 100 mL (100 mLs Intravenous Contrast Given 11/17/20 1437)    ED Course  I have reviewed the triage vital signs and the nursing notes.  Pertinent labs & imaging results that were available during my care of the patient were reviewed by me and considered in my medical decision making (see chart for details).    MDM Rules/Calculators/A&P                         Initial impression-patient presents with sudden onset of abdominal pain.  She is alert, does not appear to be in acute stress, vital signs reassuring.  Triage obtain basic lab work-up, concern for possible obstruction, will obtain CT imaging for further evaluation.  Work-up-CBC unremarkable, CMP shows slight hyperglycemia 135, lipase 35, CT abdomen pelvis reveals scattered mildly dilated loops of small bowel with air-fluid levels, concern for diminished bowel transit versus low-grade partial bowel obstruction.  Reassessment-patient was reassessed after pain medications, states she is feeling better, continues to be tachycardic on my exam.  She has no complaints of nausea at this time.  Due to history of frequent bowel obstruction and imaging concerning for obstruction will consult with general surgery for further recommendations.  Updated patient on recommendations from general surgery, they are in agreement this plan, will consult with hospitalist for admission.  Consult-spoke with Dr. Dema Severin of general surgery, he recommends making the patient n.p.o., medicine admit, and his team will consult on the patient.  Will defer on NG tube placement as she denies nausea or vomiting at this time.  Spoke with Dr. Osborn Coho who will admit the patient.  Rule out-low suspicion for systemic infection as patient is nontoxic-appearing, vital signs reassuring.  Low suspicion for pancreatitis as lipase within normal limits.  Low suspicion for diverticulitis as there is no leukocytosis seen on CBC, no diverticuli seen on CT abdomen  pelvis.  Low suspicion for UTI, pyelonephritis, kidney stone as patient denies urinary symptoms, she has no CVA tenderness present on exam, there is no noted stone or stranding around the kidneys.    Plan-admit to medicine due to small bowel obstruction likely due to adhesions, patient will be transferred to [facility] for [facility] evaluation.  Final Clinical Impression(s) / ED Diagnoses Final diagnoses:  Generalized abdominal pain  Partial intestinal obstruction, unspecified cause [facility])    Rx / DC Orders ED Discharge Orders     None        Marcello Fennel, PA-C 11/17/20 1602    Lennice Sites, DO 11/17/20 1615

## 2020-11-17 NOTE — Consult Note (Signed)
CC: Possible bowel obstruction  Requesting provider: Deno Etienne [facility]  HPI: Susan Whitaker is an 50 y.o. female with hx of GERD, HTN, HLD prior SBO presented to [facility] with 1d hx of abdominal crampy pains and nausea. No radiation. No aggravating/alleviating factors. No vomiting. No fever/chills. No recent weight changes.   She underwent workup at [facility] and was admitted from their to medicine. We were asked to see.  Currently without abdominal pain. Since arriving, she has been passing flatus and reports 3 Bms in last couple hours. No nausea currently but not hungry.  Past Medical History:  Diagnosis Date   Abdominal pain 10/04/2020   Class 2 obesity due to excess calories with body mass index (BMI) of 35.0 to 35.9 in adult 06/2010   DM type 2 (diabetes mellitus, type 2) (HCC)    Gastric ulcer    GERD (gastroesophageal reflux disease)    H/O constipation    H/O hemorrhoids    History of small bowel obstruction 06/02/2019   Hyperlipidemia    Hypertension    Hypokalemia 2019   Menorrhagia    Primary hyperaldosteronism ([place]) 02/21/2012   S/P radiofrequency ablation operation for arrhythmia 10/03/20 10/04/2020   SVT (supraventricular tachycardia) ([place])    Noted 11/2011 admission   Thrombocytopenia [facility])     Past Surgical History:  Procedure Laterality Date   ABDOMINAL ADHESION SURGERY  06/03/2019   Dr Marlou Starks   ABDOMINAL SURGERY     68 months of age-- unsure of type of surgery   COLONOSCOPY  04/2016   hemorrhoids--normal per pt with Dr Collene Mares   DILATATION & CURETTAGE/HYSTEROSCOPY WITH MYOSURE N/A 02/08/2020   Procedure: DILATATION & CURETTAGE/HYSTEROSCOPY WITH MYOSURE;  Surgeon: Waymon Amato, MD;  Location: [place];  Service: Gynecology;  Laterality: N/A;   HEMORRHOID SURGERY  2005/2006   INTRAUTERINE DEVICE (IUD) INSERTION N/A 02/08/2020   Procedure: INTRAUTERINE DEVICE (IUD) INSERTION UNDER ULTRASOUND GUIDANCE;  Surgeon: Waymon Amato, MD;  Location: [place];  Service: Gynecology;  Laterality: N/A;  Mirena   LAPAROTOMY N/A 06/03/2019   Procedure: EXPLORATORY LAPAROTOMY WITH LYSIS OF ADHESIONS;  Surgeon: Jovita Kussmaul, MD;  Location: [facility];  Service: General;  Laterality: N/A;   SVT ABLATION N/A 10/03/2020   Procedure: SVT ABLATION;  Surgeon: Evans Lance, MD;  Location: [place];  Service: Cardiovascular;  Laterality: N/A;   UTERINE FIBROID EMBOLIZATION  2010   at [facility]    Family History  Problem Relation Age of Onset   Cancer Mother        breast   Stroke Mother    Cancer Maternal Grandmother        breast   Hypertension Maternal Grandmother    Cancer Other        breast, lung   Hypertension Other    Stroke Other    Vision loss Father        some   Heart disease Father        Rhythm disturbance   Hypertension Sister    Hypertension Brother    Hypertension Brother    Diabetes Neg Hx     Social:  reports that she has never smoked. She has never used smokeless tobacco. She reports that she does not drink alcohol and does not use drugs.  Allergies:  Allergies  Allergen Reactions   Lisinopril Cough    Medications: I have reviewed the patient's current medications.  Results for orders placed or performed during the hospital  encounter of 11/17/20 (from the past 48 hour(s))  CBC with Differential     Status: Abnormal   Collection Time: 11/17/20  1:12 PM  Result Value Ref Range   WBC 8.8 4.0 - 10.5 K/uL   RBC 4.53 3.87 - 5.11 MIL/uL   Hemoglobin 13.3 12.0 - 15.0 g/dL   HCT 39.7 36.0 - 46.0 %   MCV 87.6 80.0 - 100.0 fL   MCH 29.4 26.0 - 34.0 pg   MCHC 33.5 30.0 - 36.0 g/dL   RDW 13.3 11.5 - 15.5 %   Platelets 454 (H) 150 - 400 K/uL   nRBC 0.0 0.0 - 0.2 %   Neutrophils Relative % 51 %   Neutro Abs 4.5 1.7 - 7.7 K/uL   Lymphocytes Relative 41 %   Lymphs Abs 3.6 0.7 - 4.0 K/uL   Monocytes Relative 6 %   Monocytes Absolute 0.6 0.1 - 1.0 K/uL   Eosinophils Relative 2 %   Eosinophils Absolute 0.2 0.0 -  0.5 K/uL   Basophils Relative 0 %   Basophils Absolute 0.0 0.0 - 0.1 K/uL   Immature Granulocytes 0 %   Abs Immature Granulocytes 0.02 0.00 - 0.07 K/uL    Comment: Performed at [facility], [address] [place], [place] [address]  Comprehensive metabolic panel     Status: Abnormal   Collection Time: 11/17/20  1:12 PM  Result Value Ref Range   Sodium 137 135 - 145 mmol/L   Potassium 3.9 3.5 - 5.1 mmol/L   Chloride 102 98 - 111 mmol/L   CO2 26 22 - 32 mmol/L   Glucose, Bld 135 (H) 70 - 99 mg/dL    Comment: Glucose reference range applies only to samples taken after fasting for at least 8 hours.   BUN 23 (H) 6 - 20 mg/dL   Creatinine, Ser 0.84 0.44 - 1.00 mg/dL   Calcium 9.1 8.9 - 10.3 mg/dL   Total Protein 8.5 (H) 6.5 - 8.1 g/dL   Albumin 4.2 3.5 - 5.0 g/dL   AST 20 15 - 41 U/L   ALT 16 0 - 44 U/L   Alkaline Phosphatase 54 38 - 126 U/L   Total Bilirubin 0.4 0.3 - 1.2 mg/dL   GFR, Estimated >60 >60 mL/min    Comment: (NOTE) Calculated using the CKD-EPI Creatinine Equation (2021)    Anion gap 9 5 - 15    Comment: Performed at [facility], [address] [place], [place] [address]  Lipase, blood     Status: None   Collection Time: 11/17/20  1:12 PM  Result Value Ref Range   Lipase 35 11 - 51 U/L    Comment: Performed at [facility], [address] [place], [place] [address]  Resp Panel by RT-PCR (Flu A&B, Covid) Nasopharyngeal Swab     Status: None   Collection Time: 11/17/20  3:59 PM   Specimen: Nasopharyngeal Swab; Nasopharyngeal(NP) swabs in vial transport medium  Result Value Ref Range   SARS Coronavirus 2 by RT PCR NEGATIVE NEGATIVE    Comment: (NOTE) SARS-CoV-2 target nucleic acids are NOT DETECTED.  The SARS-CoV-2 RNA is generally detectable in upper respiratory specimens during the acute phase of infection. The lowest concentration of SARS-CoV-2 viral copies this assay can detect is 138 copies/mL. A negative result does not  preclude SARS-Cov-2 infection and should not be used as the sole basis for treatment or other patient management decisions. A negative result  may occur with  improper specimen collection/handling, submission of specimen other than nasopharyngeal swab, presence of viral mutation(s) within the areas targeted by this assay, and inadequate number of viral copies(<138 copies/mL). A negative result must be combined with clinical observations, patient history, and epidemiological information. The expected result is Negative.  Fact Sheet for Patients:  EntrepreneurPulse.com.au  Fact Sheet for Healthcare Providers:  IncredibleEmployment.be  This test is no t yet approved or cleared by the [place] FDA and  has been authorized for detection and/or diagnosis of SARS-CoV-2 by FDA under an Emergency Use Authorization (EUA). This EUA will remain  in effect (meaning this test can be used) for the duration of the COVID-19 declaration under Section 564(b)(1) of the Act, 21 U.S.C.section 360bbb-3(b)(1), unless the authorization is terminated  or revoked sooner.       Influenza A by PCR NEGATIVE NEGATIVE   Influenza B by PCR NEGATIVE NEGATIVE    Comment: (NOTE) The Xpert Xpress SARS-CoV-2/FLU/RSV plus assay is intended as an aid in the diagnosis of influenza from Nasopharyngeal swab specimens and should not be used as a sole basis for treatment. Nasal washings and aspirates are unacceptable for Xpert Xpress SARS-CoV-2/FLU/RSV testing.  Fact Sheet for Patients: EntrepreneurPulse.com.au  Fact Sheet for Healthcare Providers: IncredibleEmployment.be  This test is not yet approved or cleared by the [place] FDA and has been authorized for detection and/or diagnosis of SARS-CoV-2 by FDA under an Emergency Use Authorization (EUA). This EUA will remain in effect (meaning this test can be used) for the duration of  the COVID-19 declaration under Section 564(b)(1) of the Act, 21 U.S.C. section 360bbb-3(b)(1), unless the authorization is terminated or revoked.  Performed at [facility], [address] [place], [place] [address]     CT ABDOMEN PELVIS W CONTRAST  Result Date: 11/17/2020 CLINICAL DATA:  Abdominal pain EXAM: CT ABDOMEN AND PELVIS WITH CONTRAST TECHNIQUE: Multidetector CT imaging of the abdomen and pelvis was performed using the standard protocol following bolus administration of intravenous contrast. CONTRAST:  143mL OMNIPAQUE IOHEXOL 300 MG/ML  SOLN COMPARISON:  10/27/2020 FINDINGS: Lower chest: No acute abnormality. Hepatobiliary: No focal liver abnormality is seen. No gallstones, gallbladder wall thickening, or biliary dilatation. Pancreas: Unremarkable. No pancreatic ductal dilatation or surrounding inflammatory changes. Spleen: Normal in size without focal abnormality. Adrenals/Urinary Tract: Normal adrenal glands. No kidney mass, stone or hydronephrosis. Stomach/Bowel: Normal appearance of the stomach. Colon is unremarkable. There are scattered mildly dilated loops of small bowel with air-fluid levels and intervening segments of normal caliber small bowel. For example, within the left lower quadrant of the abdomen there is a dilated small bowel loop measuring 2.2 cm, image 41/2. The terminal ileum appears increased in caliber with signs of fecalization. Proximal to the terminal ileum there is a normal caliber small bowel loops. No significant bowel wall thickening or inflammation. Vascular/Lymphatic: Aortic atherosclerosis. No aneurysm. No abdominopelvic adenopathy Reproductive: Enlarged fibroid uterus containing calcified and noncalcified fibroids no IUD is in place. No adnexal mass. Other: No free fluid or fluid collections. No signs of pneumoperitoneum. Musculoskeletal: 2 IMPRESSION: 1. There are scattered mildly dilated loops of small bowel with air-fluid levels and intervening  segments of normal caliber small bowel. The terminal ileum appears increased in caliber with signs of fecalization, which can be seen with diminished small bowel transit. Findings may reflect low-grade partial small bowel obstruction in this patient who has known adhesive disease. 2. Enlarged fibroid uterus. 3. Aortic atherosclerosis. Aortic Atherosclerosis (ICD10-I70.0). Electronically Signed  By: Kerby Moors M.D.   On: 11/17/2020 15:06    ROS - all of the below systems have been reviewed with the patient and positives are indicated with bold text General: chills, fever or night sweats Eyes: blurry vision or double vision ENT: epistaxis or sore throat Allergy/Immunology: itchy/watery eyes or nasal congestion Hematologic/Lymphatic: bleeding problems, blood clots or swollen lymph nodes Endocrine: temperature intolerance or unexpected weight changes Breast: new or changing breast lumps or nipple discharge Resp: cough, shortness of breath, or wheezing CV: chest pain or dyspnea on exertion GI: as per HPI GU: dysuria, trouble voiding, or hematuria MSK: joint pain or joint stiffness Neuro: TIA or stroke symptoms Derm: pruritus and skin lesion changes Psych: anxiety and depression  PE Blood pressure 116/76, pulse 88, temperature 98.4 F (36.9 C), temperature source Oral, resp. rate 16, height 5\' 3"  (1.6 m), weight 86.2 kg, last menstrual period 10/29/2020, SpO2 96 %. Constitutional: NAD; conversant; no deformities Eyes: Moist conjunctiva; no lid lag; anicteric; PERRL Neck: Trachea midline; no thyromegaly Lungs: Normal respiratory effort; no tactile fremitus CV: RRR; no palpable thrills; no pitting edema GI: Abd soft, nontender, nondistended; no palpable hepatosplenomegaly. Midline scar MSK: Normal range of motion of extremities; no clubbing/cyanosis Psychiatric: Appropriate affect; alert and oriented x3 Lymphatic: No palpable cervical or axillary lymphadenopathy  Results for orders  placed or performed during the hospital encounter of 11/17/20 (from the past 48 hour(s))  CBC with Differential     Status: Abnormal   Collection Time: 11/17/20  1:12 PM  Result Value Ref Range   WBC 8.8 4.0 - 10.5 K/uL   RBC 4.53 3.87 - 5.11 MIL/uL   Hemoglobin 13.3 12.0 - 15.0 g/dL   HCT 39.7 36.0 - 46.0 %   MCV 87.6 80.0 - 100.0 fL   MCH 29.4 26.0 - 34.0 pg   MCHC 33.5 30.0 - 36.0 g/dL   RDW 13.3 11.5 - 15.5 %   Platelets 454 (H) 150 - 400 K/uL   nRBC 0.0 0.0 - 0.2 %   Neutrophils Relative % 51 %   Neutro Abs 4.5 1.7 - 7.7 K/uL   Lymphocytes Relative 41 %   Lymphs Abs 3.6 0.7 - 4.0 K/uL   Monocytes Relative 6 %   Monocytes Absolute 0.6 0.1 - 1.0 K/uL   Eosinophils Relative 2 %   Eosinophils Absolute 0.2 0.0 - 0.5 K/uL   Basophils Relative 0 %   Basophils Absolute 0.0 0.0 - 0.1 K/uL   Immature Granulocytes 0 %   Abs Immature Granulocytes 0.02 0.00 - 0.07 K/uL    Comment: Performed at [facility], [address] [place], [place] [address]  Comprehensive metabolic panel     Status: Abnormal   Collection Time: 11/17/20  1:12 PM  Result Value Ref Range   Sodium 137 135 - 145 mmol/L   Potassium 3.9 3.5 - 5.1 mmol/L   Chloride 102 98 - 111 mmol/L   CO2 26 22 - 32 mmol/L   Glucose, Bld 135 (H) 70 - 99 mg/dL    Comment: Glucose reference range applies only to samples taken after fasting for at least 8 hours.   BUN 23 (H) 6 - 20 mg/dL   Creatinine, Ser 0.84 0.44 - 1.00 mg/dL   Calcium 9.1 8.9 - 10.3 mg/dL   Total Protein 8.5 (H) 6.5 - 8.1 g/dL   Albumin 4.2 3.5 - 5.0 g/dL   AST 20 15 - 41 U/L   ALT 16 0 -  44 U/L   Alkaline Phosphatase 54 38 - 126 U/L   Total Bilirubin 0.4 0.3 - 1.2 mg/dL   GFR, Estimated >60 >60 mL/min    Comment: (NOTE) Calculated using the CKD-EPI Creatinine Equation (2021)    Anion gap 9 5 - 15    Comment: Performed at [facility], [address] [place], [place] [address]  Lipase, blood     Status: None   Collection Time:  11/17/20  1:12 PM  Result Value Ref Range   Lipase 35 11 - 51 U/L    Comment: Performed at [facility], [address] [place], [place] [address]  Resp Panel by RT-PCR (Flu A&B, Covid) Nasopharyngeal Swab     Status: None   Collection Time: 11/17/20  3:59 PM   Specimen: Nasopharyngeal Swab; Nasopharyngeal(NP) swabs in vial transport medium  Result Value Ref Range   SARS Coronavirus 2 by RT PCR NEGATIVE NEGATIVE    Comment: (NOTE) SARS-CoV-2 target nucleic acids are NOT DETECTED.  The SARS-CoV-2 RNA is generally detectable in upper respiratory specimens during the acute phase of infection. The lowest concentration of SARS-CoV-2 viral copies this assay can detect is 138 copies/mL. A negative result does not preclude SARS-Cov-2 infection and should not be used as the sole basis for treatment or other patient management decisions. A negative result may occur with  improper specimen collection/handling, submission of specimen other than nasopharyngeal swab, presence of viral mutation(s) within the areas targeted by this assay, and inadequate number of viral copies(<138 copies/mL). A negative result must be combined with clinical observations, patient history, and epidemiological information. The expected result is Negative.  Fact Sheet for Patients:  EntrepreneurPulse.com.au  Fact Sheet for Healthcare Providers:  IncredibleEmployment.be  This test is no t yet approved or cleared by the [place] FDA and  has been authorized for detection and/or diagnosis of SARS-CoV-2 by FDA under an Emergency Use Authorization (EUA). This EUA will remain  in effect (meaning this test can be used) for the duration of the COVID-19 declaration under Section 564(b)(1) of the Act, 21 U.S.C.section 360bbb-3(b)(1), unless the authorization is terminated  or revoked sooner.       Influenza A by PCR NEGATIVE NEGATIVE   Influenza B by PCR NEGATIVE  NEGATIVE    Comment: (NOTE) The Xpert Xpress SARS-CoV-2/FLU/RSV plus assay is intended as an aid in the diagnosis of influenza from Nasopharyngeal swab specimens and should not be used as a sole basis for treatment. Nasal washings and aspirates are unacceptable for Xpert Xpress SARS-CoV-2/FLU/RSV testing.  Fact Sheet for Patients: EntrepreneurPulse.com.au  Fact Sheet for Healthcare Providers: IncredibleEmployment.be  This test is not yet approved or cleared by the [place] FDA and has been authorized for detection and/or diagnosis of SARS-CoV-2 by FDA under an Emergency Use Authorization (EUA). This EUA will remain in effect (meaning this test can be used) for the duration of the COVID-19 declaration under Section 564(b)(1) of the Act, 21 U.S.C. section 360bbb-3(b)(1), unless the authorization is terminated or revoked.  Performed at [facility], [address] [place], [place] [address]     CT ABDOMEN PELVIS W CONTRAST  Result Date: 11/17/2020 CLINICAL DATA:  Abdominal pain EXAM: CT ABDOMEN AND PELVIS WITH CONTRAST TECHNIQUE: Multidetector CT imaging of the abdomen and pelvis was performed using the standard protocol following bolus administration of intravenous contrast. CONTRAST:  140mL OMNIPAQUE IOHEXOL 300 MG/ML  SOLN COMPARISON:  10/27/2020 FINDINGS: Lower chest: No acute  abnormality. Hepatobiliary: No focal liver abnormality is seen. No gallstones, gallbladder wall thickening, or biliary dilatation. Pancreas: Unremarkable. No pancreatic ductal dilatation or surrounding inflammatory changes. Spleen: Normal in size without focal abnormality. Adrenals/Urinary Tract: Normal adrenal glands. No kidney mass, stone or hydronephrosis. Stomach/Bowel: Normal appearance of the stomach. Colon is unremarkable. There are scattered mildly dilated loops of small bowel with air-fluid levels and intervening segments of normal caliber small bowel.  For example, within the left lower quadrant of the abdomen there is a dilated small bowel loop measuring 2.2 cm, image 41/2. The terminal ileum appears increased in caliber with signs of fecalization. Proximal to the terminal ileum there is a normal caliber small bowel loops. No significant bowel wall thickening or inflammation. Vascular/Lymphatic: Aortic atherosclerosis. No aneurysm. No abdominopelvic adenopathy Reproductive: Enlarged fibroid uterus containing calcified and noncalcified fibroids no IUD is in place. No adnexal mass. Other: No free fluid or fluid collections. No signs of pneumoperitoneum. Musculoskeletal: 2 IMPRESSION: 1. There are scattered mildly dilated loops of small bowel with air-fluid levels and intervening segments of normal caliber small bowel. The terminal ileum appears increased in caliber with signs of fecalization, which can be seen with diminished small bowel transit. Findings may reflect low-grade partial small bowel obstruction in this patient who has known adhesive disease. 2. Enlarged fibroid uterus. 3. Aortic atherosclerosis. Aortic Atherosclerosis (ICD10-I70.0). Electronically Signed   By: Kerby Moors M.D.   On: 11/17/2020 15:06     A/P: ADY HEIMANN is an 50 y.o. female with likely resolving pSBO  -Clear liquids, advance to soft as tolerated -Discussed she may have had a partial/low grade SBO and dietary modifications that may help reduce the risk of recurrence of this in the future including chewing food really well.  Sharon Mt. Dema Severin, M.D. [facility] Use [email or web address] to contact on call provider

## 2020-11-17 NOTE — ED Triage Notes (Signed)
Pt crying and moaning in obvious distress, taken directly to room 9 from triage. Pt reports chronic abd pain, this episode starting last night, she called her daughter after vomiting x 1 this morning, who brought her here.

## 2020-11-17 NOTE — H&P (Signed)
History and Physical    Susan Whitaker VZC:[id number] DOB: 02-Feb-1971 DOA: 11/17/2020  PCP: Flossie Buffy, NP  Patient coming from: [facility], 2 daughters at bedside  I have personally briefly reviewed patient's old medical records in [place]  Chief Complaint: Abdominal pain  HPI: Susan Whitaker is a 50 y.o. female with medical history significant for HTN, SVT s/p ablation 10/03/2020, primary hyperaldosteronism, controlled type 2 diabetes, history of recurrent SBO, GERD and anxiety who presents with abdominal pain.  Patient states that earlier this morning she had a chicken breast at [facility] and about an hour later began to have sharp abdominal pain.  At first thought that she could walk it off but decided to call her daughter to take her to the ER after pain progressed.  She denies any nausea, vomiting or diarrhea.  Patient has had history of recurrent SBO's in the past.  States that she had required abdominal surgery at 92 months of age for possible SBO versus volvulus.  In 05/2019 she was admitted with SBO requiring ex lap lysis of adhesion. 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  Then on 10/03/20 she underwent ablation for her SVT and shortly postprocedural she developed acute onset sharp midline abdominal pain.  CT abdomen at the time showed no acute or significant findings.  There was some slight rotation of the mesentery but per radiology they doubt the significance of the finding.  Patient reports that her pain at that time was much more excruciating than the pain that she is currently feeling now. States she has seen general surgery and GI after that but could not get answers to her pain and has been referred to [facility]. She has not had another recurrent episode of pain since then until now.   ED Course: She was afebrile, tachycardic with heart rate up to 120s and normotensive on room air.  No leukocytosis or  anemia.  Platelet of 454 which appears chronic.  CMP unremarkable.  BG 135.  CT of the abdomen and pelvis shows dilated loops of small bowel consistent with low-grade partial small bowel obstruction.  ED physician has discussed with general surgery Dr. Dema Severin who recommends patient be kept n.p.o. without NG tube at this time as she does not have any vomiting.  They will see in consultation in the morning.  Review of Systems: Constitutional: No Weight Change, No Fever ENT/Mouth: No sore throat, No Rhinorrhea Eyes: No Eye Pain, No Vision Changes Cardiovascular: No Chest Pain, no SOB Respiratory: No Cough, No Sputum Gastrointestinal: No Nausea, No Vomiting, No Diarrhea, No Constipation, + Pain Genitourinary: no Urinary Incontinence Musculoskeletal: No Arthralgias, No Myalgias Skin: No Skin Lesions, No Pruritus, Neuro: no Weakness, No Numbness,   Psych: No Anxiety/Panic, No Depression,+decrease appetite Heme/Lymph: No Bruising, No Bleeding  Past Medical History:  Diagnosis Date   Abdominal pain 10/04/2020   Class 2 obesity due to excess calories with body mass index (BMI) of 35.0 to 35.9 in adult 06/2010   DM type 2 (diabetes mellitus, type 2) (HCC)    Gastric ulcer    GERD (gastroesophageal reflux disease)    H/O constipation    H/O hemorrhoids    History of small bowel obstruction 06/02/2019   Hyperlipidemia    Hypertension    Hypokalemia 2019   Menorrhagia    Primary hyperaldosteronism ([place]) 02/21/2012   S/P radiofrequency ablation operation for arrhythmia 10/03/20 10/04/2020   SVT (supraventricular tachycardia) ([place])  Noted 11/2011 admission   Thrombocytopenia [facility])     Past Surgical History:  Procedure Laterality Date   ABDOMINAL ADHESION SURGERY  06/03/2019   Dr Marlou Starks   ABDOMINAL SURGERY     27 months of age-- unsure of type of surgery   COLONOSCOPY  04/2016   hemorrhoids--normal per pt with Dr Collene Mares   DILATATION & CURETTAGE/HYSTEROSCOPY WITH MYOSURE N/A 02/08/2020    Procedure: DILATATION & CURETTAGE/HYSTEROSCOPY WITH MYOSURE;  Surgeon: Waymon Amato, MD;  Location: [place];  Service: Gynecology;  Laterality: N/A;   HEMORRHOID SURGERY  2005/2006   INTRAUTERINE DEVICE (IUD) INSERTION N/A 02/08/2020   Procedure: INTRAUTERINE DEVICE (IUD) INSERTION UNDER ULTRASOUND GUIDANCE;  Surgeon: Waymon Amato, MD;  Location: [place];  Service: Gynecology;  Laterality: N/A;  Mirena   LAPAROTOMY N/A 06/03/2019   Procedure: EXPLORATORY LAPAROTOMY WITH LYSIS OF ADHESIONS;  Surgeon: Jovita Kussmaul, MD;  Location: [facility];  Service: General;  Laterality: N/A;   SVT ABLATION N/A 10/03/2020   Procedure: SVT ABLATION;  Surgeon: Evans Lance, MD;  Location: [place] [facility];  Service: Cardiovascular;  Laterality: N/A;   UTERINE FIBROID EMBOLIZATION  2010   at [facility]     reports that she has never smoked. She has never used smokeless tobacco. She reports that she does not drink alcohol and does not use drugs. Social History  Allergies  Allergen Reactions   Lisinopril Cough    Family History  Problem Relation Age of Onset   Cancer Mother        breast   Stroke Mother    Cancer Maternal Grandmother        breast   Hypertension Maternal Grandmother    Cancer Other        breast, lung   Hypertension Other    Stroke Other    Vision loss Father        some   Heart disease Father        Rhythm disturbance   Hypertension Sister    Hypertension Brother    Hypertension Brother    Diabetes Neg Hx      Prior to Admission medications   Medication Sig Start Date End Date Taking? Authorizing Provider  acetaminophen (TYLENOL) 325 MG tablet Take 2 tablets (650 mg total) by mouth every 4 (four) hours as needed for headache or mild pain. 10/04/20   Isaiah Serge, NP  amLODipine (NORVASC) 10 MG tablet Take 1 tablet (10 mg total) by mouth daily. 06/28/20   Nche, Charlene Brooke, NP  aspirin EC 81 MG tablet Take 1 tablet (81 mg total) by mouth  daily. 08/11/15   Debbrah Alar, NP  atorvastatin (LIPITOR) 40 MG tablet Take 1 tablet (40 mg total) by mouth daily. 04/29/20   Debbrah Alar, NP  canagliflozin (INVOKANA) 300 MG TABS tablet Take 1 tablet (300 mg total) by mouth daily before breakfast. 10/25/20   Elayne Snare, MD  glucose blood (ACCU-CHEK GUIDE) test strip 1 each by Other route 3 (three) times daily. Use as instructed to check once daily. 11/10/17   Elayne Snare, MD  losartan (COZAAR) 100 MG tablet Take 1 tablet (100 mg total) by mouth daily. 06/28/20   Nche, Charlene Brooke, NP  metoprolol tartrate (LOPRESSOR) 50 MG tablet Take 1 tablet (50 mg total) by mouth 2 (two) times daily. 10/04/20   Isaiah Serge, NP  ondansetron [facility]) 4 MG/2ML SOLN injection Inject 2 mLs (4 mg total) into the vein every 6 (  six) hours as needed for nausea. 10/03/20   Shirley Friar, PA-C  ONETOUCH DELICA LANCETS FINE MISC Use to check blood sugar 3 times daily 09/28/16   Elayne Snare, MD  polyethylene glycol (MIRALAX / GLYCOLAX) 17 g packet Take 17 g by mouth once a week.    [provider]  Probiotic Product ([place]) Take 1 capsule by mouth daily.    [provider]  spironolactone (ALDACTONE) 100 MG tablet Take 1 tablet (100 mg total) by mouth daily. 09/23/20   Elayne Snare, MD  TRULICITY 3 PR/9.1MB SOPN INJECT 3 MG  ONCE A WEEK 11/10/20   Elayne Snare, MD    Physical Exam: Vitals:   11/17/20 1730 11/17/20 1804 11/17/20 1856 11/17/20 1949  BP: 109/65 (!) 112/59 (!) 144/73 116/76  Pulse: 94 99 96 88  Resp: 10 16 16 16   Temp:   98.4 F (36.9 C) 98.4 F (36.9 C)  TempSrc:   Oral Oral  SpO2: 92% 93% 97% 96%  Weight:      Height:        Constitutional: NAD, calm, comfortable, well-appearing middle-aged female laying flat in bed Vitals:   11/17/20 1730 11/17/20 1804 11/17/20 1856 11/17/20 1949  BP: 109/65 (!) 112/59 (!) 144/73 116/76  Pulse: 94 99 96 88  Resp: 10 16 16 16   Temp:   98.4 F (36.9  C) 98.4 F (36.9 C)  TempSrc:   Oral Oral  SpO2: 92% 93% 97% 96%  Weight:      Height:       Eyes: PERRL, lids and conjunctivae normal ENMT: Mucous membranes are moist.  Neck: normal, supple Respiratory: clear to auscultation bilaterally, no wheezing, no crackles. Normal respiratory effort. No accessory muscle use.  Cardiovascular: Regular rate and rhythm, no murmurs / rubs / gallops. No extremity edema.  Abdomen: no tenderness, no masses palpated.. Bowel sounds positive.  Healed midline surgical scar. Musculoskeletal: no clubbing / cyanosis. No joint deformity upper and lower extremities. Good ROM, no contractures. Normal muscle tone.  Skin: no rashes, lesions, ulcers. No induration Neurologic: CN 2-12 grossly intact. Sensation intact,  Strength 5/5 in all 4.  Psychiatric: Normal judgment and insight. Alert and oriented x 3. Normal mood.    Labs on Admission: I have personally reviewed following labs and imaging studies  CBC: Recent Labs  Lab 11/17/20 1312  WBC 8.8  NEUTROABS 4.5  HGB 13.3  HCT 39.7  MCV 87.6  PLT 846*   Basic Metabolic Panel: Recent Labs  Lab 11/17/20 1312  NA 137  K 3.9  CL 102  CO2 26  GLUCOSE 135*  BUN 23*  CREATININE 0.84  CALCIUM 9.1   GFR: Estimated Creatinine Clearance: 82.4 mL/min (by C-G formula based on SCr of 0.84 mg/dL). Liver Function Tests: Recent Labs  Lab 11/17/20 1312  AST 20  ALT 16  ALKPHOS 54  BILITOT 0.4  PROT 8.5*  ALBUMIN 4.2   Recent Labs  Lab 11/17/20 1312  LIPASE 35   No results for input(s): AMMONIA in the last 168 hours. Coagulation Profile: No results for input(s): INR, PROTIME in the last 168 hours. Cardiac Enzymes: No results for input(s): CKTOTAL, CKMB, CKMBINDEX, TROPONINI in the last 168 hours. BNP (last 3 results) No results for input(s): PROBNP in the last 8760 hours. HbA1C: No results for input(s): HGBA1C in the last 72 hours. CBG: No results for input(s): GLUCAP in the last 168  hours. Lipid Profile: No results for input(s): CHOL, HDL,  LDLCALC, TRIG, CHOLHDL, LDLDIRECT in the last 72 hours. Thyroid Function Tests: No results for input(s): TSH, T4TOTAL, FREET4, T3FREE, THYROIDAB in the last 72 hours. Anemia Panel: No results for input(s): VITAMINB12, FOLATE, FERRITIN, TIBC, IRON, RETICCTPCT in the last 72 hours. Urine analysis:    Component Value Date/Time   COLORURINE STRAW (A) 06/21/2020 0300   APPEARANCEUR CLEAR 06/21/2020 0300   LABSPEC 1.015 06/21/2020 0300   PHURINE 7.5 06/21/2020 0300   GLUCOSEU >=500 (A) 06/21/2020 0300   GLUCOSEU >=1000 (A) 04/05/2019 1654   HGBUR NEGATIVE 06/21/2020 0300   HGBUR moderate 04/28/2010 1113   BILIRUBINUR NEGATIVE 06/21/2020 0300   BILIRUBINUR 0.2 11/10/2017 0937   KETONESUR NEGATIVE 06/21/2020 0300   PROTEINUR NEGATIVE 06/21/2020 0300   UROBILINOGEN 0.2 04/05/2019 1654   NITRITE NEGATIVE 06/21/2020 0300   LEUKOCYTESUR NEGATIVE 06/21/2020 0300    Radiological Exams on Admission: CT ABDOMEN PELVIS W CONTRAST  Result Date: 11/17/2020 CLINICAL DATA:  Abdominal pain EXAM: CT ABDOMEN AND PELVIS WITH CONTRAST TECHNIQUE: Multidetector CT imaging of the abdomen and pelvis was performed using the standard protocol following bolus administration of intravenous contrast. CONTRAST:  162mL OMNIPAQUE IOHEXOL 300 MG/ML  SOLN COMPARISON:  10/27/2020 FINDINGS: Lower chest: No acute abnormality. Hepatobiliary: No focal liver abnormality is seen. No gallstones, gallbladder wall thickening, or biliary dilatation. Pancreas: Unremarkable. No pancreatic ductal dilatation or surrounding inflammatory changes. Spleen: Normal in size without focal abnormality. Adrenals/Urinary Tract: Normal adrenal glands. No kidney mass, stone or hydronephrosis. Stomach/Bowel: Normal appearance of the stomach. Colon is unremarkable. There are scattered mildly dilated loops of small bowel with air-fluid levels and intervening segments of normal caliber small bowel.  For example, within the left lower quadrant of the abdomen there is a dilated small bowel loop measuring 2.2 cm, image 41/2. The terminal ileum appears increased in caliber with signs of fecalization. Proximal to the terminal ileum there is a normal caliber small bowel loops. No significant bowel wall thickening or inflammation. Vascular/Lymphatic: Aortic atherosclerosis. No aneurysm. No abdominopelvic adenopathy Reproductive: Enlarged fibroid uterus containing calcified and noncalcified fibroids no IUD is in place. No adnexal mass. Other: No free fluid or fluid collections. No signs of pneumoperitoneum. Musculoskeletal: 2 IMPRESSION: 1. There are scattered mildly dilated loops of small bowel with air-fluid levels and intervening segments of normal caliber small bowel. The terminal ileum appears increased in caliber with signs of fecalization, which can be seen with diminished small bowel transit. Findings may reflect low-grade partial small bowel obstruction in this patient who has known adhesive disease. 2. Enlarged fibroid uterus. 3. Aortic atherosclerosis. Aortic Atherosclerosis (ICD10-I70.0). Electronically Signed   By: Kerby Moors M.D.   On: 11/17/2020 15:06      Assessment/Plan  Partial small bowel obstruction -hx of recurrent SBO with first episode at 74 months of age, most recent had mesenteric volvulus in May that resolved spontaneously -no vomiting at this time so NG tube deferred -keep NPO. Short course of continuous IV fluids -Surgery is aware and will see in consultation tomorrow -prn pain control and antiemetic  -unclear why pt had episode of severe pain after her ablation on 5/20 with negative CT abdomen finding but agree with continuous outpatient work-up at [facility].  Hx of SVT -s/p ablation on 10/03/20  HTN -continue on amlodipine. Need to verify whether she is on spironolactone.  Type 2 diabetes -Controlled with last hemoglobin A1c of 5.5 in June -Add sliding scale as  needed  DVT prophylaxis:.SCD  Code Status: Full Family Communication: Plan discussed with  patient and 2 daughters at bedside  disposition Plan: Home with at least 2 midnight stays  Consults called:  Admission status: inpatient  Level of care: Med-Surg  Status is: Inpatient  Remains inpatient appropriate because:Inpatient level of care appropriate due to severity of illness  Dispo: The patient is from: Home              Anticipated d/c is to: Home              Patient currently is not medically stable to d/c.   Difficult to place patient No         Orene Desanctis DO [facility]   If 7PM-7AM, please contact night-coverage [email or web address]   11/17/2020, 7:51 PM

## 2020-11-17 NOTE — ED Notes (Signed)
Pt. Having intermittent severe pain.  Crying and restless movements all over the bed.

## 2020-11-18 ENCOUNTER — Inpatient Hospital Stay (HOSPITAL_COMMUNITY): Payer: 59

## 2020-11-18 DIAGNOSIS — R1084 Generalized abdominal pain: Secondary | ICD-10-CM

## 2020-11-18 DIAGNOSIS — K56600 Partial intestinal obstruction, unspecified as to cause: Secondary | ICD-10-CM

## 2020-11-18 LAB — GLUCOSE, CAPILLARY
Glucose-Capillary: 104 mg/dL — ABNORMAL HIGH (ref 70–99)
Glucose-Capillary: 100 mg/dL — ABNORMAL HIGH (ref 70–99)
Glucose-Capillary: 105 mg/dL — ABNORMAL HIGH (ref 70–99)

## 2020-11-18 LAB — BASIC METABOLIC PANEL
Anion gap: 9 (ref 5–15)
BUN: 22 mg/dL — ABNORMAL HIGH (ref 6–20)
CO2: 25 mmol/L (ref 22–32)
Calcium: 9.1 mg/dL (ref 8.9–10.3)
Chloride: 104 mmol/L (ref 98–111)
Creatinine, Ser: 0.93 mg/dL (ref 0.44–1.00)
GFR, Estimated: >60 mL/min (ref >60)
Glucose, Bld: 82 mg/dL (ref 70–99)
Potassium: 3.8 mmol/L (ref 3.5–5.1)
Sodium: 138 mmol/L (ref 135–145)

## 2020-11-18 MED ORDER — SODIUM CHLORIDE (PF) 0.9 % IJ SOLN
INTRAMUSCULAR | Status: AC
  Administered 2020-11-19: 1 mL via INTRAVENOUS
  Filled 2020-11-18: qty 50

## 2020-11-18 MED ORDER — INSULIN ASPART 100 UNIT/ML IJ SOLN: 0.0000 [IU] | Freq: Every day | INTRAMUSCULAR | Status: DC

## 2020-11-18 MED ORDER — HYDROMORPHONE HCL 1 MG/ML IJ SOLN
1.0000 mg | Freq: Once | INTRAMUSCULAR | Status: AC
  Administered 2020-11-18: 1 mg via INTRAVENOUS
  Filled 2020-11-18: qty 1

## 2020-11-18 MED ORDER — SODIUM CHLORIDE 0.9 % IV SOLN
12.5000 mg | Freq: Once | INTRAVENOUS | Status: AC
  Administered 2020-11-18: 12.5 mg via INTRAVENOUS
  Filled 2020-11-18: qty 12.5

## 2020-11-18 MED ORDER — AMLODIPINE BESYLATE 10 MG PO TABS
10.0000 mg | ORAL_TABLET | Freq: Every day | ORAL | Status: DC
  Administered 2020-11-18: 10 mg via ORAL
  Filled 2020-11-18 (×2): qty 1

## 2020-11-18 MED ORDER — INSULIN ASPART 100 UNIT/ML IJ SOLN
0.0000 [IU] | Freq: Three times a day (TID) | INTRAMUSCULAR | Status: DC
  Administered 2020-11-21 – 2020-11-22 (×2): 1 [IU] via SUBCUTANEOUS

## 2020-11-18 NOTE — Progress Notes (Signed)
Assessment & Plan: HD#2 - partial SBO, recurrent  Hx of ex lap with LOA by Dr. Autumn Messing, Jan 2021  Recurrent episodes of SBO  Soft diet today, passing flatus, no BM's  Encouraged OOB, ambulation in halls with daughter.  Will follow.  Likely resolving SBO and hopefully home tomorrow.        [name], MD       [facility]       Office: [phone]   Chief Complaint: Recurrent partial SBO  Subjective: Patient in chair, family at bedside.  Ate breakfast, no complaints.  Objective: Vital signs in last 24 hours: Temp:  [98.3 F (36.8 C)-99.2 F (37.3 C)] 99.2 F (37.3 C) (07/05 8676) Pulse Rate:  [78-121] 78 (07/05 0638) Resp:  [10-30] 17 (07/05 0305) BP: (96-165)/(59-114) 137/70 (07/05 0638) SpO2:  [91 %-99 %] 98 % (07/05 1950) Weight:  [86.2 kg] 86.2 kg (07/04 1246) Last BM Date: 11/17/20  Intake/Output from previous day: No intake/output data recorded. Intake/Output this shift: Total I/O In: -  Out: 600 [Urine:600]  Physical Exam: HEENT - sclerae clear, mucous membranes moist Neck - soft Abdomen - soft without distension; no mass; no tenderness Ext - no edema, non-tender Neuro - alert & oriented, no focal deficits  Lab Results:  Recent Labs    11/17/20 1312  WBC 8.8  HGB 13.3  HCT 39.7  PLT 454*   BMET Recent Labs    11/17/20 1312 11/18/20 0455  NA 137 138  K 3.9 3.8  CL 102 104  CO2 26 25  GLUCOSE 135* 82  BUN 23* 22*  CREATININE 0.84 0.93  CALCIUM 9.1 9.1   PT/INR No results for input(s): LABPROT, INR in the last 72 hours. Comprehensive Metabolic Panel:    Component Value Date/Time   NA 138 11/18/2020 0455   NA 137 11/17/2020 1312   NA 138 08/21/2012 1002   K 3.8 11/18/2020 0455   K 3.9 11/17/2020 1312   K 3.5 08/21/2012 1002   CL 104 11/18/2020 0455   CL 102 11/17/2020 1312   CL 102 08/21/2012 1002   CO2 25 11/18/2020 0455   CO2 26 11/17/2020 1312   CO2 28 08/21/2012 1002   BUN 22 (H) 11/18/2020 0455    BUN 23 (H) 11/17/2020 1312   BUN 16.7 08/21/2012 1002   CREATININE 0.93 11/18/2020 0455   CREATININE 0.84 11/17/2020 1312   CREATININE 0.83 12/03/2013 1112   CREATININE 0.83 04/13/2013 1022   CREATININE 1.0 08/21/2012 1002   GLUCOSE 82 11/18/2020 0455   GLUCOSE 135 (H) 11/17/2020 1312   GLUCOSE 132 (H) 08/21/2012 1002   CALCIUM 9.1 11/18/2020 0455   CALCIUM 9.1 11/17/2020 1312   CALCIUM 8.9 08/21/2012 1002   AST 20 11/17/2020 1312   AST 23 10/03/2020 1300   AST 10 08/21/2012 1002   ALT 16 11/17/2020 1312   ALT 19 10/03/2020 1300   ALT 8 08/21/2012 1002   ALKPHOS 54 11/17/2020 1312   ALKPHOS 55 10/03/2020 1300   ALKPHOS 60 08/21/2012 1002   BILITOT 0.4 11/17/2020 1312   BILITOT 0.6 10/03/2020 1300   BILITOT 0.22 08/21/2012 1002   PROT 8.5 (H) 11/17/2020 1312   PROT 7.9 10/03/2020 1300   PROT 7.6 08/21/2012 1002   ALBUMIN 4.2 11/17/2020 1312   ALBUMIN 3.8 10/03/2020 1300   ALBUMIN 3.3 (L) 08/21/2012 1002    Studies/Results: DG Abd 1 View  Result Date: 11/18/2020 CLINICAL DATA:  RIGHT-side abdominal pain  EXAM: ABDOMEN - 1 VIEW COMPARISON:  Portable exam 0844 hours compared to CT abdomen and pelvis 11/17/2020 FINDINGS: Excreted contrast material within bladder. Large calcified uterine leiomyomata with note of IUD. Nonobstructive bowel gas pattern. No bowel dilatation or bowel wall thickening. Osseous structures unremarkable. IMPRESSION: Normal bowel gas pattern. Calcified uterine leiomyomata. Electronically Signed   By: Lavonia Dana M.D.   On: 11/18/2020 09:02   CT ABDOMEN PELVIS W CONTRAST  Result Date: 11/17/2020 CLINICAL DATA:  Abdominal pain EXAM: CT ABDOMEN AND PELVIS WITH CONTRAST TECHNIQUE: Multidetector CT imaging of the abdomen and pelvis was performed using the standard protocol following bolus administration of intravenous contrast. CONTRAST:  136mL OMNIPAQUE IOHEXOL 300 MG/ML  SOLN COMPARISON:  10/27/2020 FINDINGS: Lower chest: No acute abnormality. Hepatobiliary: No  focal liver abnormality is seen. No gallstones, gallbladder wall thickening, or biliary dilatation. Pancreas: Unremarkable. No pancreatic ductal dilatation or surrounding inflammatory changes. Spleen: Normal in size without focal abnormality. Adrenals/Urinary Tract: Normal adrenal glands. No kidney mass, stone or hydronephrosis. Stomach/Bowel: Normal appearance of the stomach. Colon is unremarkable. There are scattered mildly dilated loops of small bowel with air-fluid levels and intervening segments of normal caliber small bowel. For example, within the left lower quadrant of the abdomen there is a dilated small bowel loop measuring 2.2 cm, image 41/2. The terminal ileum appears increased in caliber with signs of fecalization. Proximal to the terminal ileum there is a normal caliber small bowel loops. No significant bowel wall thickening or inflammation. Vascular/Lymphatic: Aortic atherosclerosis. No aneurysm. No abdominopelvic adenopathy Reproductive: Enlarged fibroid uterus containing calcified and noncalcified fibroids no IUD is in place. No adnexal mass. Other: No free fluid or fluid collections. No signs of pneumoperitoneum. Musculoskeletal: 2 IMPRESSION: 1. There are scattered mildly dilated loops of small bowel with air-fluid levels and intervening segments of normal caliber small bowel. The terminal ileum appears increased in caliber with signs of fecalization, which can be seen with diminished small bowel transit. Findings may reflect low-grade partial small bowel obstruction in this patient who has known adhesive disease. 2. Enlarged fibroid uterus. 3. Aortic atherosclerosis. Aortic Atherosclerosis (ICD10-I70.0). Electronically Signed   By: Kerby Moors M.D.   On: 11/17/2020 15:06      [name] 11/18/2020   Patient ID: Susan Whitaker, female   DOB: July 13, 1970, 50 y.o.   MRN: [id number]

## 2020-11-18 NOTE — Progress Notes (Signed)
[facility]                                                                              Patient Demographics  Susan Whitaker, is a 50 y.o. female, DOB - [birth year], QMV:[id number]  Admit date - 11/17/2020   Admitting Physician Anselm Jungling, DO  Outpatient Primary MD for the patient is Nche, Bonna Gains, NP  Outpatient specialists:   LOS - 1  days   Medical records reviewed and are as summarized below:    No chief complaint on file.      Brief summary   Patient is a female with history of hypertension, SVT status post ablation 09/2020, primary hyperaldosteronism, diabetes type 2, history of recurrent SBO, GERD, anxiety presented with abdominal pain.  Per patient, she had a chicken breast at [employer] and about an hour later started having sharp abdominal pain.  No nausea vomiting or diarrhea.  Patient has history of recurrent SBO in the past and h, was admitted with SBO and 05/2019 requiring ex lap lysis of adhesions.  Then in 09/14/20-09/17/2020 she was hospitalized for mesenteric volvulus, responded spontaneously.  On 10/03/2020, underwent ablation for the SVT and sharply postprocedure, developed acute onset of sharp midline abdominal pain..  CT abdomen at that time showed no acute findings.  Patient has seen GI and general surgery after pulmonary had no diagnoses and was referred to [facility]. CT abdomen pelvis showed dilated loops of small bowel consistent with low-grade partial SBO.  General surgery was consulted.  Patient was kept n.p.o. with NG tube as he was not having active vomiting.   Assessment & Plan    Principal Problem: Partial SBO (small bowel obstruction) (HCC) with history of recurrent SBO in the past -Currently no vomiting, did not NG tube, patient states she did have 3 BMs yesterday. - Abd xray this am shows resolving SBO. -Diet advanced, hopefully will be able to DC home today tolerate solid diet. -Probably recurrent SBO due to adhesions however may  benefit from colonoscopy outpatient.  Avoid constipation  Active Problems:   Essential hypertension -BP currently stable, continue amlodipine    SVT (supraventricular tachycardia) (HCC),   S/P radiofrequency ablation  10/03/20 -No acute issues    Type 2 diabetes mellitus (HCC) -CBGs controlled, hemoglobin A1c 5.5 on 09/23/2020 -Continue sliding scale insulin while inpatient -Hold Trulicity, Invokana  Hyperlipidemia -Resume Lipitor   Obesity Estimated body mass index is 33.66 kg/m as calculated from the following:   Height as of this encounter: 5\' 3"  (1.6 m).   Weight as of this encounter: 86.2 kg.  Code Status: Full CODE STATUS DVT Prophylaxis:  SCDs Start: 11/17/20 1918   Level of Care: Level of care: Med-Surg Family Communication: Discussed all imaging results, lab results, explained to the patient   Disposition Plan:     Status is: Inpatient  Remains inpatient appropriate because:Inpatient level of care appropriate due to severity of illness  Dispo: The patient is from: Home              Anticipated d/c is to: Home  Patient currently is not medically stable to d/c.  Diet advanced to soft diet, will await if patient is able to tolerate.  DC if cleared by surgery.   Difficult to place patient No      Time Spent in minutes   35 minutes  Procedures:  None   Consultants:   General surgery  Antimicrobials:   Anti-infectives (From admission, onward)    None          Medications  Scheduled Meds:  amLODipine  10 mg Oral Daily   Continuous Infusions: PRN Meds:.HYDROmorphone (DILAUDID) injection, ondansetron (ZOFRAN) IV      Subjective:   Adassa Neidhart was seen and examined today.  States overall feeling better, no nausea or vomiting.  Abdominal pain improved.  No fevers.  No BM last night.    Objective:   Vitals:   11/17/20 1856 11/17/20 1949 11/18/20 0305 11/18/20 0638  BP: (!) 144/73 116/76 96/73 137/70  Pulse: 96 88 78 78  Resp:  16 16 17    Temp: 98.4 F (36.9 C) 98.4 F (36.9 C) 99.1 F (37.3 C) 99.2 F (37.3 C)  TempSrc: Oral Oral Oral Oral  SpO2: 97% 96% 99% 98%  Weight:      Height:        Intake/Output Summary (Last 24 hours) at 11/18/2020 6295 Last data filed at 11/18/2020 0915 Gross per 24 hour  Intake --  Output 600 ml  Net -600 ml     Wt Readings from Last 3 Encounters:  11/17/20 86.2 kg  10/03/20 88 kg  09/24/20 90.4 kg     Exam General: Alert and oriented x 3, NAD Cardiovascular: S1 S2 auscultated, no murmurs, RRR Respiratory: Clear to auscultation bilaterally, no wheezing, rales or rhonchi Gastrointestinal: Soft, nontender, nondistended, + bowel sounds Ext: no pedal edema bilaterally Neuro: no new deficits Psych: Normal affect and demeanor, alert and oriented x3    Data Reviewed:  I have personally reviewed following labs and imaging studies  Micro Results Recent Results (from the past 240 hour(s))  Resp Panel by RT-PCR (Flu A&B, Covid) Nasopharyngeal Swab     Status: None   Collection Time: 11/17/20  3:59 PM   Specimen: Nasopharyngeal Swab; Nasopharyngeal(NP) swabs in vial transport medium  Result Value Ref Range Status   SARS Coronavirus 2 by RT PCR NEGATIVE NEGATIVE Final    Comment: (NOTE) SARS-CoV-2 target nucleic acids are NOT DETECTED.  The SARS-CoV-2 RNA is generally detectable in upper respiratory specimens during the acute phase of infection. The lowest concentration of SARS-CoV-2 viral copies this assay can detect is 138 copies/mL. A negative result does not preclude SARS-Cov-2 infection and should not be used as the sole basis for treatment or other patient management decisions. A negative result may occur with  improper specimen collection/handling, submission of specimen other than nasopharyngeal swab, presence of viral mutation(s) within the areas targeted by this assay, and inadequate number of viral copies(<138 copies/mL). A negative result must be combined  with clinical observations, patient history, and epidemiological information. The expected result is Negative.  Fact Sheet for Patients:  BloggerCourse.com  Fact Sheet for Healthcare Providers:  SeriousBroker.it  This test is no t yet approved or cleared by the [place] FDA and  has been authorized for detection and/or diagnosis of SARS-CoV-2 by FDA under an Emergency Use Authorization (EUA). This EUA will remain  in effect (meaning this test can be used) for the duration of the COVID-19 declaration under Section 564(b)(1) of the Act, 21  U.S.C.section 360bbb-3(b)(1), unless the authorization is terminated  or revoked sooner.       Influenza A by PCR NEGATIVE NEGATIVE Final   Influenza B by PCR NEGATIVE NEGATIVE Final    Comment: (NOTE) The Xpert Xpress SARS-CoV-2/FLU/RSV plus assay is intended as an aid in the diagnosis of influenza from Nasopharyngeal swab specimens and should not be used as a sole basis for treatment. Nasal washings and aspirates are unacceptable for Xpert Xpress SARS-CoV-2/FLU/RSV testing.  Fact Sheet for Patients: BloggerCourse.com  Fact Sheet for Healthcare Providers: SeriousBroker.it  This test is not yet approved or cleared by the [place] FDA and has been authorized for detection and/or diagnosis of SARS-CoV-2 by FDA under an Emergency Use Authorization (EUA). This EUA will remain in effect (meaning this test can be used) for the duration of the COVID-19 declaration under Section 564(b)(1) of the Act, 21 U.S.C. section 360bbb-3(b)(1), unless the authorization is terminated or revoked.  Performed at [facility], [address] [place], [place] [address]     Radiology Reports DG Abd 1 View  Result Date: 11/18/2020 CLINICAL DATA:  RIGHT-side abdominal pain EXAM: ABDOMEN - 1 VIEW COMPARISON:  Portable exam 0844 hours compared  to CT abdomen and pelvis 11/17/2020 FINDINGS: Excreted contrast material within bladder. Large calcified uterine leiomyomata with note of IUD. Nonobstructive bowel gas pattern. No bowel dilatation or bowel wall thickening. Osseous structures unremarkable. IMPRESSION: Normal bowel gas pattern. Calcified uterine leiomyomata. Electronically Signed   By: Ulyses Southward M.D.   On: 11/18/2020 09:02   CT ABDOMEN PELVIS W CONTRAST  Result Date: 11/17/2020 CLINICAL DATA:  Abdominal pain EXAM: CT ABDOMEN AND PELVIS WITH CONTRAST TECHNIQUE: Multidetector CT imaging of the abdomen and pelvis was performed using the standard protocol following bolus administration of intravenous contrast. CONTRAST:  OMNIPAQUE IOHEXOL 300 MG/ML  SOLN COMPARISON:  10/27/2020 FINDINGS: Lower chest: No acute abnormality. Hepatobiliary: No focal liver abnormality is seen. No gallstones, gallbladder wall thickening, or biliary dilatation. Pancreas: Unremarkable. No pancreatic ductal dilatation or surrounding inflammatory changes. Spleen: Normal in size without focal abnormality. Adrenals/Urinary Tract: Normal adrenal glands. No kidney mass, stone or hydronephrosis. Stomach/Bowel: Normal appearance of the stomach. Colon is unremarkable. There are scattered mildly dilated loops of small bowel with air-fluid levels and intervening segments of normal caliber small bowel. For example, within the left lower quadrant of the abdomen there is a dilated small bowel loop measuring 2.2 cm, image 41/2. The terminal ileum appears increased in caliber with signs of fecalization. Proximal to the terminal ileum there is a normal caliber small bowel loops. No significant bowel wall thickening or inflammation. Vascular/Lymphatic: Aortic atherosclerosis. No aneurysm. No abdominopelvic adenopathy Reproductive: Enlarged fibroid uterus containing calcified and noncalcified fibroids no IUD is in place. No adnexal mass. Other: No free fluid or fluid collections. No  signs of pneumoperitoneum. Musculoskeletal: 2 IMPRESSION: 1. There are scattered mildly dilated loops of small bowel with air-fluid levels and intervening segments of normal caliber small bowel. The terminal ileum appears increased in caliber with signs of fecalization, which can be seen with diminished small bowel transit. Findings may reflect low-grade partial small bowel obstruction in this patient who has known adhesive disease. 2. Enlarged fibroid uterus. 3. Aortic atherosclerosis. Aortic Atherosclerosis (ICD10-I70.0). Electronically Signed   By: Signa Kell M.D.   On: 11/17/2020 15:06   CT ENTERO ABD/PELVIS W CONTAST  Result Date: 10/27/2020 CLINICAL DATA:  Severe postprandial abdominal pain. Unintentional 20 lb weight loss. Previous small-bowel obstruction. EXAM:  CT ABDOMEN AND PELVIS WITH CONTRAST (ENTEROGRAPHY) TECHNIQUE: Multidetector CT of the abdomen and pelvis during bolus administration of intravenous contrast. Negative oral contrast was given. CONTRAST:  ISOVUE-300 IOPAMIDOL (ISOVUE-300) INJECTION 61% COMPARISON:  10/03/2020 FINDINGS: Lower Chest: No acute findings. Hepatobiliary: No hepatic masses identified. Gallbladder is unremarkable. No evidence of biliary ductal dilatation. Pancreas:  No mass or inflammatory changes. Spleen: Within normal limits in size and appearance. Adrenals/Urinary Tract: No masses identified. No evidence of ureteral calculi or hydronephrosis. Unremarkable unopacified urinary bladder. Stomach/Bowel: No evidence of bowel wall thickening, abnormal contrast enhancement, or dilatation. No evidence of mesenteric inflammatory changes, enteric fistula, or abnormal fluid collections. The terminal ileum is normal in appearance. Vascular/Lymphatic: No pathologically enlarged lymph nodes. No acute vascular findings. Reproductive: Enlarged uterus is seen with multiple calcified uterine fibroids mainly in the fundal region, largest measuring 5 cm. IUD is seen within the  endometrial cavity with tip in the lower to mid uterine corpus. Adnexal regions are unremarkable. Other:  None. Musculoskeletal:  No suspicious bone lesions identified. IMPRESSION: No radiographic evidence of inflammatory bowel disease or other acute findings. Enlarged uterus with multiple calcified uterine fibroids. Low position of IUD, with tip in the mid to lower uterine corpus. Electronically Signed   By: Danae Orleans M.D.   On: 10/27/2020 20:07    Lab Data:  CBC: Recent Labs  Lab 11/17/20 1312  WBC 8.8  NEUTROABS 4.5  HGB 13.3  HCT 39.7  MCV 87.6  PLT 454*   Basic Metabolic Panel: Recent Labs  Lab 11/17/20 1312 11/18/20 0455  NA 137 138  K 3.9 3.8  CL 102 104  CO2 26 25  GLUCOSE 135* 82  BUN 23* 22*  CREATININE 0.84 0.93  CALCIUM 9.1 9.1   GFR: Estimated Creatinine Clearance: 74.5 mL/min (by C-G formula based on SCr of 0.93 mg/dL). Liver Function Tests: Recent Labs  Lab 11/17/20 1312  AST 20  ALT 16  ALKPHOS 54  BILITOT 0.4  PROT 8.5*  ALBUMIN 4.2   Recent Labs  Lab 11/17/20 1312  LIPASE 35   No results for input(s): AMMONIA in the last 168 hours. Coagulation Profile: No results for input(s): INR, PROTIME in the last 168 hours. Cardiac Enzymes: No results for input(s): CKTOTAL, CKMB, CKMBINDEX, TROPONINI in the last 168 hours. BNP (last 3 results) No results for input(s): PROBNP in the last 8760 hours. HbA1C: No results for input(s): HGBA1C in the last 72 hours. CBG: No results for input(s): GLUCAP in the last 168 hours. Lipid Profile: No results for input(s): CHOL, HDL, LDLCALC, TRIG, CHOLHDL, LDLDIRECT in the last 72 hours. Thyroid Function Tests: No results for input(s): TSH, T4TOTAL, FREET4, T3FREE, THYROIDAB in the last 72 hours. Anemia Panel: No results for input(s): VITAMINB12, FOLATE, FERRITIN, TIBC, IRON, RETICCTPCT in the last 72 hours. Urine analysis:    Component Value Date/Time   COLORURINE STRAW (A) 06/21/2020 0300   APPEARANCEUR  CLEAR 06/21/2020 0300   LABSPEC 1.015 06/21/2020 0300   PHURINE 7.5 06/21/2020 0300   GLUCOSEU >=500 (A) 06/21/2020 0300   GLUCOSEU >=1000 (A) 04/05/2019 1654   HGBUR NEGATIVE 06/21/2020 0300   HGBUR moderate 04/28/2010 1113   BILIRUBINUR NEGATIVE 06/21/2020 0300   BILIRUBINUR 0.2 11/10/2017 0937   KETONESUR NEGATIVE 06/21/2020 0300   PROTEINUR NEGATIVE 06/21/2020 0300   UROBILINOGEN 0.2 04/05/2019 1654   NITRITE NEGATIVE 06/21/2020 0300   LEUKOCYTESUR NEGATIVE 06/21/2020 0300     Landin Tallon M.D. [facility] 11/18/2020, 9:56 AM  Available via  Epic secure chat 7am-7pm After 7 pm, please refer to night coverage provider listed on amion.

## 2020-11-18 NOTE — Plan of Care (Signed)
Pt reports feeling much better; no pain, had a very good night of sleep; no s/s of acute distress or pain reported or observed; call light and cell phone within reach and bed a lowest position for safety; eager to go home.

## 2020-11-19 ENCOUNTER — Inpatient Hospital Stay (HOSPITAL_COMMUNITY): Payer: 59

## 2020-11-19 DIAGNOSIS — K56600 Partial intestinal obstruction, unspecified as to cause: Secondary | ICD-10-CM

## 2020-11-19 LAB — CBC
HCT: 40.3 % (ref 36.0–46.0)
Hemoglobin: 13 g/dL (ref 12.0–15.0)
MCH: 28.8 pg (ref 26.0–34.0)
MCHC: 32.3 g/dL (ref 30.0–36.0)
MCV: 89.2 fL (ref 80.0–100.0)
Platelets: 405 10*3/uL — ABNORMAL HIGH (ref 150–400)
RBC: 4.52 MIL/uL (ref 3.87–5.11)
RDW: 13.4 % (ref 11.5–15.5)
WBC: 9.7 10*3/uL (ref 4.0–10.5)
nRBC: 0 % (ref 0.0–0.2)

## 2020-11-19 LAB — GLUCOSE, CAPILLARY
Glucose-Capillary: 102 mg/dL — ABNORMAL HIGH (ref 70–99)
Glucose-Capillary: 104 mg/dL — ABNORMAL HIGH (ref 70–99)
Glucose-Capillary: 171 mg/dL — ABNORMAL HIGH (ref 70–99)
Glucose-Capillary: 92 mg/dL (ref 70–99)
Glucose-Capillary: 92 mg/dL (ref 70–99)

## 2020-11-19 LAB — BASIC METABOLIC PANEL
Anion gap: 8 (ref 5–15)
BUN: 19 mg/dL (ref 6–20)
CO2: 28 mmol/L (ref 22–32)
Calcium: 9.2 mg/dL (ref 8.9–10.3)
Chloride: 102 mmol/L (ref 98–111)
Creatinine, Ser: 0.85 mg/dL (ref 0.44–1.00)
GFR, Estimated: >60 mL/min (ref >60)
Glucose, Bld: 120 mg/dL — ABNORMAL HIGH (ref 70–99)
Potassium: 3.9 mmol/L (ref 3.5–5.1)
Sodium: 138 mmol/L (ref 135–145)

## 2020-11-19 MED ORDER — LACTATED RINGERS IV SOLN: INTRAVENOUS | Status: DC

## 2020-11-19 MED ORDER — KETOROLAC TROMETHAMINE 30 MG/ML IJ SOLN: 30.0000 mg | Freq: Four times a day (QID) | INTRAMUSCULAR | Status: DC | PRN

## 2020-11-19 MED ORDER — FENTANYL CITRATE (PF) 100 MCG/2ML IJ SOLN
25.0000 ug | Freq: Once | INTRAMUSCULAR | Status: AC
  Administered 2020-11-19: 25 ug via INTRAVENOUS
  Filled 2020-11-19: qty 2

## 2020-11-19 MED ORDER — IOHEXOL 300 MG/ML  SOLN
100.0000 mL | Freq: Once | INTRAMUSCULAR | Status: AC | PRN
  Administered 2020-11-19: 100 mL via INTRAVENOUS

## 2020-11-19 MED ORDER — HYDROMORPHONE HCL 1 MG/ML IJ SOLN
1.0000 mg | INTRAMUSCULAR | Status: DC | PRN
  Administered 2020-11-19 – 2020-11-21 (×5): 1 mg via INTRAVENOUS
  Filled 2020-11-19 (×6): qty 1

## 2020-11-19 MED FILL — Ondansetron HCl Inj 4 MG/2ML (2 MG/ML): INTRAMUSCULAR | Qty: 2 | Status: AC

## 2020-11-19 NOTE — Plan of Care (Signed)
Pt just come back from CT scan; now sleeping; daughter Lowella Dandy called to inform her of the status of her mother. Will monitor

## 2020-11-19 NOTE — Plan of Care (Signed)
Pt had an episode of acute epigastric pain and distress last night; NP ordered pain medications; after pt reported no improvement a CT scan of the abdomen was ordered STAT, pt tolerated procedure well; as pain would not subside, NP ordered NG tube to be placed at lower intermittent suction; pt also tolerated that procedure very well; pt reports some relief since NG tube placed; not much of stomach content suctioned; she is now resting comfortably in bed; no s/s of acute distress or pain reported or observed; call light and phone within reach and bed at lowest position for safety.

## 2020-11-19 NOTE — Progress Notes (Signed)
PROGRESS NOTE    Susan Whitaker  XBL:[id number] DOB: 1971-01-19 DOA: 11/17/2020 PCP: Flossie Buffy, NP (Confirm with patient/family/NH records and if not entered, this HAS to be entered at [facility] point of entry. "No PCP" if truly none.)   No chief complaint on file.   Brief Narrative:  Patient is a female with history of hypertension, SVT status post ablation 09/2020, primary hyperaldosteronism, diabetes type 2, history of recurrent SBO, GERD, anxiety presented with abdominal pain.  Per patient, she had a chicken breast at [employer] and about an hour later started having sharp abdominal pain.  No nausea vomiting or diarrhea.  Patient has history of recurrent SBO in the past and h, was admitted with SBO and 05/2019 requiring ex lap lysis of adhesions.  Then in 09/14/20-09/17/2020 she was hospitalized for mesenteric volvulus, responded spontaneously.  On 10/03/2020, underwent ablation for the SVT and sharply postprocedure, developed acute onset of sharp midline abdominal pain..  CT abdomen at that time showed no acute findings.  Patient has seen GI and general surgery after pulmonary had no diagnoses and was referred to [facility]. CT abdomen pelvis showed dilated loops of small bowel consistent with low-grade partial SBO.  General surgery was consulted.  Patient was kept n.p.o. with NG tube as she was not having active vomiting. -Patient initially was improving clinically diet was being advanced patient subsequently developed recurrent partial small bowel obstruction and NG tube had to be placed back in.   Assessment & Plan:   Principal Problem:   SBO (small bowel obstruction) (HCC) Active Problems:   Essential hypertension   SVT (supraventricular tachycardia) (HCC)   S/P radiofrequency ablation operation for arrhythmia 10/03/20   Type 2 diabetes mellitus ([place])   1 recurrent partial small bowel obstruction -Patient initially was improving clinically however overnight developed nausea, induced  emesis, worsening abdominal pain. -Repeat CT abdomen and pelvis consistent with partial small bowel obstruction felt secondary to adhesions. -Passing flatus.  No bowel movements today. -NG tube in place. -Patient placed on IV fluids per general surgery. -Increase IV Dilaudid to 1 mg every 4 hours as needed for pain control. -IV Toradol as needed. -General surgery following and appreciate input and recommendations.  2.  Hypertension -BP currently controlled. -NG tube in place and currently NPO. -DC Norvasc. -If further blood pressure control is needed could place on IV Lopressor.  3.  History of SVT status post radiofrequency ablation 10/03/2020 -Stable.  4.  Hyperlipidemia -Currently NPO.  5.  Well-controlled diabetes mellitus type 2 -Hemoglobin A1c 5.5 (09/23/2020) -CBG 102 this morning. -SSI.  6.  Obesity   DVT prophylaxis: SCDs Code Status: Full Family Communication: Updated patient.  No family at bedside. Disposition:   Status is: Inpatient  Remains inpatient appropriate because:Inpatient level of care appropriate due to severity of illness  Dispo: The patient is from: Home              Anticipated d/c is to: Home              Patient currently is not medically stable to d/c.   Difficult to place patient No       Consultants:  General surgery: Dr. Dema Severin 11/17/2020  Procedures:  CT abdomen and pelvis 11/17/2020, 11/19/2020 Abdominal films 11/18/2020, 11/19/2020   Antimicrobials: None   Subjective: In bed.  NG tube in place.  Complaining of mid abdominal pain 7/10 now from 20/10.  Patient states pain medication is hardly helping.  Had some self-induced emesis overnight  due to worsening abdominal pain requiring NG tube to be placed back in.  Passing flatus.  No bowel movement this morning.  Objective: Vitals:   11/18/20 0638 11/18/20 1515 11/18/20 2159 11/19/20 0458  BP: 137/70 105/61 117/78 129/72  Pulse: 78 85 74 91  Resp:  16 18 18   Temp: 99.2 F (37.3 C)  98.3 F (36.8 C) 98.2 F (36.8 C) 98.1 F (36.7 C)  TempSrc: Oral Oral Oral Oral  SpO2: 98% 97% 96% 95%  Weight:      Height:        Intake/Output Summary (Last 24 hours) at 11/19/2020 1139 Last data filed at 11/19/2020 0611 Gross per 24 hour  Intake 250 ml  Output 300 ml  Net -50 ml   Filed Weights   11/17/20 1246  Weight: 86.2 kg    Examination:  General exam: Appears calm and comfortable .  NG tube in place Respiratory system: Clear to auscultation. Respiratory effort normal. Cardiovascular system: S1 & S2 heard, RRR. No JVD, murmurs, rubs, gallops or clicks. No pedal edema. Gastrointestinal system: Abdomen is soft, nontender to palpation, nondistended, hypoactive bowel sounds. Central nervous system: Alert and oriented. No focal neurological deficits. Extremities: Symmetric 5 x 5 power. Skin: No rashes, lesions or ulcers Psychiatry: Judgement and insight appear normal. Mood & affect appropriate.     Data Reviewed: I have personally reviewed following labs and imaging studies  CBC: Recent Labs  Lab 11/17/20 1312 11/19/20 0451  WBC 8.8 9.7  NEUTROABS 4.5  --   HGB 13.3 13.0  HCT 39.7 40.3  MCV 87.6 89.2  PLT 454* 405*    Basic Metabolic Panel: Recent Labs  Lab 11/17/20 1312 11/18/20 0455 11/19/20 0451  NA 137 138 138  K 3.9 3.8 3.9  CL 102 104 102  CO2 26 25 28   GLUCOSE 135* 82 120*  BUN 23* 22* 19  CREATININE 0.84 0.93 0.85  CALCIUM 9.1 9.1 9.2    GFR: Estimated Creatinine Clearance: 81.5 mL/min (by C-G formula based on SCr of 0.85 mg/dL).  Liver Function Tests: Recent Labs  Lab 11/17/20 1312  AST 20  ALT 16  ALKPHOS 54  BILITOT 0.4  PROT 8.5*  ALBUMIN 4.2    CBG: Recent Labs  Lab 11/18/20 1219 11/18/20 1752 11/18/20 2201 11/19/20 0022 11/19/20 0824  GLUCAP 104* 100* 105* 171* 102*     Recent Results (from the past 240 hour(s))  Resp Panel by RT-PCR (Flu A&B, Covid) Nasopharyngeal Swab     Status: None   Collection Time:  11/17/20  3:59 PM   Specimen: Nasopharyngeal Swab; Nasopharyngeal(NP) swabs in vial transport medium  Result Value Ref Range Status   SARS Coronavirus 2 by RT PCR NEGATIVE NEGATIVE Final    Comment: (NOTE) SARS-CoV-2 target nucleic acids are NOT DETECTED.  The SARS-CoV-2 RNA is generally detectable in upper respiratory specimens during the acute phase of infection. The lowest concentration of SARS-CoV-2 viral copies this assay can detect is 138 copies/mL. A negative result does not preclude SARS-Cov-2 infection and should not be used as the sole basis for treatment or other patient management decisions. A negative result may occur with  improper specimen collection/handling, submission of specimen other than nasopharyngeal swab, presence of viral mutation(s) within the areas targeted by this assay, and inadequate number of viral copies(<138 copies/mL). A negative result must be combined with clinical observations, patient history, and epidemiological information. The expected result is Negative.  Fact Sheet for Patients:  EntrepreneurPulse.com.au  Fact Sheet  for Healthcare Providers:  IncredibleEmployment.be  This test is no t yet approved or cleared by the [place] and  has been authorized for detection and/or diagnosis of SARS-CoV-2 by FDA under an Emergency Use Authorization (EUA). This EUA will remain  in effect (meaning this test can be used) for the duration of the COVID-19 declaration under Section 564(b)(1) of the Act, 21 U.S.C.section 360bbb-3(b)(1), unless the authorization is terminated  or revoked sooner.       Influenza A by PCR NEGATIVE NEGATIVE Final   Influenza B by PCR NEGATIVE NEGATIVE Final    Comment: (NOTE) The Xpert Xpress SARS-CoV-2/FLU/RSV plus assay is intended as an aid in the diagnosis of influenza from Nasopharyngeal swab specimens and should not be used as a sole basis for treatment. Nasal washings  and aspirates are unacceptable for Xpert Xpress SARS-CoV-2/FLU/RSV testing.  Fact Sheet for Patients: EntrepreneurPulse.com.au  Fact Sheet for Healthcare Providers: IncredibleEmployment.be  This test is not yet approved or cleared by the [place] FDA and has been authorized for detection and/or diagnosis of SARS-CoV-2 by FDA under an Emergency Use Authorization (EUA). This EUA will remain in effect (meaning this test can be used) for the duration of the COVID-19 declaration under Section 564(b)(1) of the Act, 21 U.S.C. section 360bbb-3(b)(1), unless the authorization is terminated or revoked.  Performed at [facility], [address], [place], [place] [address]          Radiology Studies: DG Abd 1 View  Result Date: 11/19/2020 CLINICAL DATA:  50 year old female status post nasogastric tube placement. EXAM: ABDOMEN - 1 VIEW COMPARISON:  Abdominal radiograph 11/18/2020. FINDINGS: New nasogastric tube in position, coiled with tip in the proximal aspect of the stomach. Visualized bowel gas pattern appears nonobstructive. No frank pneumoperitoneum noted on this single supine view. I do needed contrast material is noted within the lumen of the urinary bladder. Peripherally calcified structures are noted in the lower abdomen and anatomic pelvis corresponding to calcified fibroids on recent CT examination. IUD noted. IMPRESSION: 1. Tip of nasogastric tube is coiled in the proximal stomach. Electronically Signed   By: Vinnie Langton M.D.   On: 11/19/2020 05:59   DG Abd 1 View  Result Date: 11/18/2020 CLINICAL DATA:  RIGHT-side abdominal pain EXAM: ABDOMEN - 1 VIEW COMPARISON:  Portable exam 0844 hours compared to CT abdomen and pelvis 11/17/2020 FINDINGS: Excreted contrast material within bladder. Large calcified uterine leiomyomata with note of IUD. Nonobstructive bowel gas pattern. No bowel dilatation or bowel wall thickening. Osseous  structures unremarkable. IMPRESSION: Normal bowel gas pattern. Calcified uterine leiomyomata. Electronically Signed   By: Lavonia Dana M.D.   On: 11/18/2020 09:02   CT ABDOMEN PELVIS W CONTRAST  Result Date: 11/19/2020 CLINICAL DATA:  Abdominal pain, recent admission for small bowel obstruction EXAM: CT ABDOMEN AND PELVIS WITH CONTRAST TECHNIQUE: Multidetector CT imaging of the abdomen and pelvis was performed using the standard protocol following bolus administration of intravenous contrast. CONTRAST:  1109mL OMNIPAQUE IOHEXOL 300 MG/ML  SOLN COMPARISON:  11/17/2020 FINDINGS: Lower chest: Lung bases are clear. Hepatobiliary: Liver is within normal limits. Gallbladder sludge (series 2/image 24). No intrahepatic or extrahepatic ductal dilatation. Pancreas: Within normal limits. Spleen: Within normal limits. Adrenals/Urinary Tract: Adrenal glands are within normal limits. Kidneys are within normal limits.  No hydronephrosis. Bladder is within normal limits. Stomach/Bowel: Stomach is notable for a tiny hiatal hernia. Again noted is a dilated loop of small bowel in the right mid abdomen (series 2/image  14), with transition in the left mid abdomen (series 2/image 38), suspicious for partial small bowel obstruction on the basis of adhesions. No small bowel wall thickening or pneumatosis. No free air. Colon is not decompressed. Vascular/Lymphatic: No evidence of abdominal aortic aneurysm. Atherosclerotic calcifications of the abdominal aorta and branch vessels. No suspicious abdominopelvic lymphadenopathy. Reproductive: Calcified uterine fibroids. IUD in the lower uterine body. No adnexal masses. Other: No abdominopelvic ascites. Musculoskeletal: Visualized osseous structures are within normal limits. IMPRESSION: Dilated loop small bowel in the right mid abdomen with transition, suggesting partial small bowel obstruction, likely on the basis of adhesions. No small bowel wall thickening or pneumatosis.  No free air.  Electronically Signed   By: Julian Hy M.D.   On: 11/19/2020 00:54   CT ABDOMEN PELVIS W CONTRAST  Result Date: 11/17/2020 CLINICAL DATA:  Abdominal pain EXAM: CT ABDOMEN AND PELVIS WITH CONTRAST TECHNIQUE: Multidetector CT imaging of the abdomen and pelvis was performed using the standard protocol following bolus administration of intravenous contrast. CONTRAST:  152mL OMNIPAQUE IOHEXOL 300 MG/ML  SOLN COMPARISON:  10/27/2020 FINDINGS: Lower chest: No acute abnormality. Hepatobiliary: No focal liver abnormality is seen. No gallstones, gallbladder wall thickening, or biliary dilatation. Pancreas: Unremarkable. No pancreatic ductal dilatation or surrounding inflammatory changes. Spleen: Normal in size without focal abnormality. Adrenals/Urinary Tract: Normal adrenal glands. No kidney mass, stone or hydronephrosis. Stomach/Bowel: Normal appearance of the stomach. Colon is unremarkable. There are scattered mildly dilated loops of small bowel with air-fluid levels and intervening segments of normal caliber small bowel. For example, within the left lower quadrant of the abdomen there is a dilated small bowel loop measuring 2.2 cm, image 41/2. The terminal ileum appears increased in caliber with signs of fecalization. Proximal to the terminal ileum there is a normal caliber small bowel loops. No significant bowel wall thickening or inflammation. Vascular/Lymphatic: Aortic atherosclerosis. No aneurysm. No abdominopelvic adenopathy Reproductive: Enlarged fibroid uterus containing calcified and noncalcified fibroids no IUD is in place. No adnexal mass. Other: No free fluid or fluid collections. No signs of pneumoperitoneum. Musculoskeletal: 2 IMPRESSION: 1. There are scattered mildly dilated loops of small bowel with air-fluid levels and intervening segments of normal caliber small bowel. The terminal ileum appears increased in caliber with signs of fecalization, which can be seen with diminished small bowel  transit. Findings may reflect low-grade partial small bowel obstruction in this patient who has known adhesive disease. 2. Enlarged fibroid uterus. 3. Aortic atherosclerosis. Aortic Atherosclerosis (ICD10-I70.0). Electronically Signed   By: Kerby Moors M.D.   On: 11/17/2020 15:06        Scheduled Meds:  amLODipine  10 mg Oral Daily   insulin aspart  0-5 Units Subcutaneous QHS   insulin aspart  0-9 Units Subcutaneous TID WC   Continuous Infusions:  lactated ringers 100 mL/hr at 11/19/20 0900     LOS: 2 days    Time spent: 40 minutes    Irine Seal, MD [facility]   To contact the attending provider between 7A-7P or the covering provider during after hours 7P-7A, please log into the web site [email or web address] and access using universal [place] password for that web site. If you do not have the password, please call the hospital operator.  11/19/2020, 11:39 AM

## 2020-11-19 NOTE — Progress Notes (Signed)
Assessment & Plan: HD#2 - partial SBO, recurrent             Hx of ex lap with LOA by Dr. Autumn Messing, Jan 2021             Recurrent episodes of SBO             Increased pain last PM, patient induced vomiting  CT with transition point, partial SBO  NG tube placed last night  Reviewed AXR and CT scan.  Will add IVF's this AM.  Encouraged OOB, ambulation.  Still passing flatus.  Family at bedside.         [name], MD       [facility]       Office: [phone]   Chief Complaint: Partial SBO, recurrent  Subjective: Patient in bed, daughter at bedside.  NG in place with minimal output.  Objective: Vital signs in last 24 hours: Temp:  [98.1 F (36.7 C)-98.3 F (36.8 C)] 98.1 F (36.7 C) (07/06 0458) Pulse Rate:  [74-91] 91 (07/06 0458) Resp:  [16-18] 18 (07/06 0458) BP: (105-129)/(61-78) 129/72 (07/06 0458) SpO2:  [95 %-97 %] 95 % (07/06 0458) Last BM Date: 11/17/20  Intake/Output from previous day: 07/05 0701 - 07/06 0700 In: 370 [P.O.:360; I.V.:10] Out: 900 [Urine:600; Emesis/NG output:300] Intake/Output this shift: No intake/output data recorded.  Physical Exam: HEENT - sclerae clear, mucous membranes moist Neck - soft Abdomen - soft, minimal distension; BS present; NG with small bilious Ext - no edema, non-tender Neuro - alert & oriented, no focal deficits  Lab Results:  Recent Labs    11/17/20 1312 11/19/20 0451  WBC 8.8 9.7  HGB 13.3 13.0  HCT 39.7 40.3  PLT 454* 405*   BMET Recent Labs    11/18/20 0455 11/19/20 0451  NA 138 138  K 3.8 3.9  CL 104 102  CO2 25 28  GLUCOSE 82 120*  BUN 22* 19  CREATININE 0.93 0.85  CALCIUM 9.1 9.2   PT/INR No results for input(s): LABPROT, INR in the last 72 hours. Comprehensive Metabolic Panel:    Component Value Date/Time   NA 138 11/19/2020 0451   NA 138 11/18/2020 0455   NA 138 08/21/2012 1002   K 3.9 11/19/2020 0451   K 3.8 11/18/2020 0455   K 3.5 08/21/2012 1002    CL 102 11/19/2020 0451   CL 104 11/18/2020 0455   CL 102 08/21/2012 1002   CO2 28 11/19/2020 0451   CO2 25 11/18/2020 0455   CO2 28 08/21/2012 1002   BUN 19 11/19/2020 0451   BUN 22 (H) 11/18/2020 0455   BUN 16.7 08/21/2012 1002   CREATININE 0.85 11/19/2020 0451   CREATININE 0.93 11/18/2020 0455   CREATININE 0.83 12/03/2013 1112   CREATININE 0.83 04/13/2013 1022   CREATININE 1.0 08/21/2012 1002   GLUCOSE 120 (H) 11/19/2020 0451   GLUCOSE 82 11/18/2020 0455   GLUCOSE 132 (H) 08/21/2012 1002   CALCIUM 9.2 11/19/2020 0451   CALCIUM 9.1 11/18/2020 0455   CALCIUM 8.9 08/21/2012 1002   AST 20 11/17/2020 1312   AST 23 10/03/2020 1300   AST 10 08/21/2012 1002   ALT 16 11/17/2020 1312   ALT 19 10/03/2020 1300   ALT 8 08/21/2012 1002   ALKPHOS 54 11/17/2020 1312   ALKPHOS 55 10/03/2020 1300   ALKPHOS 60 08/21/2012 1002   BILITOT 0.4 11/17/2020 1312   BILITOT 0.6 10/03/2020 1300   BILITOT  0.22 08/21/2012 1002   PROT 8.5 (H) 11/17/2020 1312   PROT 7.9 10/03/2020 1300   PROT 7.6 08/21/2012 1002   ALBUMIN 4.2 11/17/2020 1312   ALBUMIN 3.8 10/03/2020 1300   ALBUMIN 3.3 (L) 08/21/2012 1002    Studies/Results: DG Abd 1 View  Result Date: 11/19/2020 CLINICAL DATA:  50 year old female status post nasogastric tube placement. EXAM: ABDOMEN - 1 VIEW COMPARISON:  Abdominal radiograph 11/18/2020. FINDINGS: New nasogastric tube in position, coiled with tip in the proximal aspect of the stomach. Visualized bowel gas pattern appears nonobstructive. No frank pneumoperitoneum noted on this single supine view. I do needed contrast material is noted within the lumen of the urinary bladder. Peripherally calcified structures are noted in the lower abdomen and anatomic pelvis corresponding to calcified fibroids on recent CT examination. IUD noted. IMPRESSION: 1. Tip of nasogastric tube is coiled in the proximal stomach. Electronically Signed   By: Vinnie Langton M.D.   On: 11/19/2020 05:59   DG Abd 1  View  Result Date: 11/18/2020 CLINICAL DATA:  RIGHT-side abdominal pain EXAM: ABDOMEN - 1 VIEW COMPARISON:  Portable exam 0844 hours compared to CT abdomen and pelvis 11/17/2020 FINDINGS: Excreted contrast material within bladder. Large calcified uterine leiomyomata with note of IUD. Nonobstructive bowel gas pattern. No bowel dilatation or bowel wall thickening. Osseous structures unremarkable. IMPRESSION: Normal bowel gas pattern. Calcified uterine leiomyomata. Electronically Signed   By: Lavonia Dana M.D.   On: 11/18/2020 09:02   CT ABDOMEN PELVIS W CONTRAST  Result Date: 11/19/2020 CLINICAL DATA:  Abdominal pain, recent admission for small bowel obstruction EXAM: CT ABDOMEN AND PELVIS WITH CONTRAST TECHNIQUE: Multidetector CT imaging of the abdomen and pelvis was performed using the standard protocol following bolus administration of intravenous contrast. CONTRAST:  193mL OMNIPAQUE IOHEXOL 300 MG/ML  SOLN COMPARISON:  11/17/2020 FINDINGS: Lower chest: Lung bases are clear. Hepatobiliary: Liver is within normal limits. Gallbladder sludge (series 2/image 24). No intrahepatic or extrahepatic ductal dilatation. Pancreas: Within normal limits. Spleen: Within normal limits. Adrenals/Urinary Tract: Adrenal glands are within normal limits. Kidneys are within normal limits.  No hydronephrosis. Bladder is within normal limits. Stomach/Bowel: Stomach is notable for a tiny hiatal hernia. Again noted is a dilated loop of small bowel in the right mid abdomen (series 2/image 33), with transition in the left mid abdomen (series 2/image 38), suspicious for partial small bowel obstruction on the basis of adhesions. No small bowel wall thickening or pneumatosis. No free air. Colon is not decompressed. Vascular/Lymphatic: No evidence of abdominal aortic aneurysm. Atherosclerotic calcifications of the abdominal aorta and branch vessels. No suspicious abdominopelvic lymphadenopathy. Reproductive: Calcified uterine fibroids. IUD in  the lower uterine body. No adnexal masses. Other: No abdominopelvic ascites. Musculoskeletal: Visualized osseous structures are within normal limits. IMPRESSION: Dilated loop small bowel in the right mid abdomen with transition, suggesting partial small bowel obstruction, likely on the basis of adhesions. No small bowel wall thickening or pneumatosis.  No free air. Electronically Signed   By: Julian Hy M.D.   On: 11/19/2020 00:54   CT ABDOMEN PELVIS W CONTRAST  Result Date: 11/17/2020 CLINICAL DATA:  Abdominal pain EXAM: CT ABDOMEN AND PELVIS WITH CONTRAST TECHNIQUE: Multidetector CT imaging of the abdomen and pelvis was performed using the standard protocol following bolus administration of intravenous contrast. CONTRAST:  122mL OMNIPAQUE IOHEXOL 300 MG/ML  SOLN COMPARISON:  10/27/2020 FINDINGS: Lower chest: No acute abnormality. Hepatobiliary: No focal liver abnormality is seen. No gallstones, gallbladder wall thickening, or biliary dilatation. Pancreas: Unremarkable.  No pancreatic ductal dilatation or surrounding inflammatory changes. Spleen: Normal in size without focal abnormality. Adrenals/Urinary Tract: Normal adrenal glands. No kidney mass, stone or hydronephrosis. Stomach/Bowel: Normal appearance of the stomach. Colon is unremarkable. There are scattered mildly dilated loops of small bowel with air-fluid levels and intervening segments of normal caliber small bowel. For example, within the left lower quadrant of the abdomen there is a dilated small bowel loop measuring 2.2 cm, image 41/2. The terminal ileum appears increased in caliber with signs of fecalization. Proximal to the terminal ileum there is a normal caliber small bowel loops. No significant bowel wall thickening or inflammation. Vascular/Lymphatic: Aortic atherosclerosis. No aneurysm. No abdominopelvic adenopathy Reproductive: Enlarged fibroid uterus containing calcified and noncalcified fibroids no IUD is in place. No adnexal mass.  Other: No free fluid or fluid collections. No signs of pneumoperitoneum. Musculoskeletal: 2 IMPRESSION: 1. There are scattered mildly dilated loops of small bowel with air-fluid levels and intervening segments of normal caliber small bowel. The terminal ileum appears increased in caliber with signs of fecalization, which can be seen with diminished small bowel transit. Findings may reflect low-grade partial small bowel obstruction in this patient who has known adhesive disease. 2. Enlarged fibroid uterus. 3. Aortic atherosclerosis. Aortic Atherosclerosis (ICD10-I70.0). Electronically Signed   By: Kerby Moors M.D.   On: 11/17/2020 15:06      [name] 11/19/2020   Patient ID: Susan Whitaker, female   DOB: [birth year], 50 y.o.   MRN: [id number]

## 2020-11-19 NOTE — Care Plan (Signed)
NG tube in place; intermittent suction; pt tolerated procedure well, no s/s of acute distress; pt resting as comfortable as she can;

## 2020-11-20 ENCOUNTER — Ambulatory Visit: Payer: 59 | Admitting: Internal Medicine

## 2020-11-20 ENCOUNTER — Inpatient Hospital Stay (HOSPITAL_COMMUNITY): Payer: 59

## 2020-11-20 LAB — CBC WITH DIFFERENTIAL/PLATELET
Abs Immature Granulocytes: 0.02 10*3/uL (ref 0.00–0.07)
Basophils Absolute: 0 10*3/uL (ref 0.0–0.1)
Basophils Relative: 0 %
Eosinophils Absolute: 0.2 10*3/uL (ref 0.0–0.5)
Eosinophils Relative: 2 %
HCT: 40.3 % (ref 36.0–46.0)
Hemoglobin: 13.1 g/dL (ref 12.0–15.0)
Immature Granulocytes: 0 %
Lymphocytes Relative: 33 %
Lymphs Abs: 2.8 10*3/uL (ref 0.7–4.0)
MCH: 29 pg (ref 26.0–34.0)
MCHC: 32.5 g/dL (ref 30.0–36.0)
MCV: 89.4 fL (ref 80.0–100.0)
Monocytes Absolute: 0.7 10*3/uL (ref 0.1–1.0)
Monocytes Relative: 8 %
Neutro Abs: 4.7 10*3/uL (ref 1.7–7.7)
Neutrophils Relative %: 57 %
Platelets: 429 10*3/uL — ABNORMAL HIGH (ref 150–400)
RBC: 4.51 MIL/uL (ref 3.87–5.11)
RDW: 13.3 % (ref 11.5–15.5)
WBC: 8.3 10*3/uL (ref 4.0–10.5)
nRBC: 0 % (ref 0.0–0.2)

## 2020-11-20 LAB — BASIC METABOLIC PANEL
Anion gap: 9 (ref 5–15)
BUN: 19 mg/dL (ref 6–20)
CO2: 28 mmol/L (ref 22–32)
Calcium: 9.4 mg/dL (ref 8.9–10.3)
Chloride: 103 mmol/L (ref 98–111)
Creatinine, Ser: 0.82 mg/dL (ref 0.44–1.00)
GFR, Estimated: >60 mL/min (ref >60)
Glucose, Bld: 94 mg/dL (ref 70–99)
Potassium: 3.6 mmol/L (ref 3.5–5.1)
Sodium: 140 mmol/L (ref 135–145)

## 2020-11-20 LAB — GLUCOSE, CAPILLARY
Glucose-Capillary: 92 mg/dL (ref 70–99)
Glucose-Capillary: 84 mg/dL (ref 70–99)
Glucose-Capillary: 97 mg/dL (ref 70–99)
Glucose-Capillary: 88 mg/dL (ref 70–99)

## 2020-11-20 LAB — MAGNESIUM: Magnesium: 2.2 mg/dL (ref 1.7–2.4)

## 2020-11-20 MED ORDER — POTASSIUM CHLORIDE 10 MEQ/100ML IV SOLN
10.0000 meq | INTRAVENOUS | Status: AC
  Administered 2020-11-20 (×4): 10 meq via INTRAVENOUS
  Filled 2020-11-20 (×3): qty 100

## 2020-11-20 NOTE — Progress Notes (Addendum)
PROGRESS NOTE    Susan Whitaker  YQM:[id number] DOB: 1970/06/29 DOA: 11/17/2020 PCP: Flossie Buffy, NP    No chief complaint on file.   Brief Narrative:  Patient is a female with history of hypertension, SVT status post ablation 09/2020, primary hyperaldosteronism, diabetes type 2, history of recurrent SBO, GERD, anxiety presented with abdominal pain.  Per patient, she had a chicken breast at [employer] and about an hour later started having sharp abdominal pain.  No nausea vomiting or diarrhea.  Patient has history of recurrent SBO in the past and h, was admitted with SBO and 05/2019 requiring ex lap lysis of adhesions.  Then in 09/14/20-09/17/2020 she was hospitalized for mesenteric volvulus, responded spontaneously.  On 10/03/2020, underwent ablation for the SVT and sharply postprocedure, developed acute onset of sharp midline abdominal pain..  CT abdomen at that time showed no acute findings.  Patient has seen GI and general surgery after pulmonary had no diagnoses and was referred to [facility]. CT abdomen pelvis showed dilated loops of small bowel consistent with low-grade partial SBO.  General surgery was consulted.  Patient was kept n.p.o. with NG tube as she was not having active vomiting. -Patient initially was improving clinically diet was being advanced patient subsequently developed recurrent partial small bowel obstruction and NG tube had to be placed back in.   Assessment & Plan:   Principal Problem:   SBO (small bowel obstruction) (HCC) Active Problems:   Essential hypertension   SVT (supraventricular tachycardia) (HCC)   S/P radiofrequency ablation operation for arrhythmia 10/03/20   Type 2 diabetes mellitus (HCC)   Partial intestinal obstruction (HCC)   1 recurrent partial small bowel obstruction -Patient initially was improving clinically however the evening of 11/18/2020 -11/19/2020, patient developed nausea, induced emesis, worsening abdominal pain. -Repeat CT abdomen and  pelvis consistent with partial small bowel obstruction felt secondary to adhesions. -Passing flatus.  No bowel movements today. -NG tube in place with minimal output. -Improving clinically. -Abdominal films with normal bowel gas pattern. -Continue IV fluids -Continue IV Dilaudid as needed severe pain and IV Toradol as needed. -Patient likely for clamping trial today. -Keep potassium approximately at 4, magnesium approximately at 2 -Per general surgery..  2.  Hypertension -Blood pressure controlled.   -NG tube in place currently NPO.   -Norvasc discontinued.   -Follow.   3.  History of SVT status post radiofrequency ablation 10/03/2020 -Stable.  4.  Hyperlipidemia -Currently NPO. -Resume statin on discharge.  5.  Well-controlled diabetes mellitus type 2 -Hemoglobin A1c 5.5 (09/23/2020) -CBG 92 this morning. -Patient currently n.p.o. -SSI.  6.  Obesity   DVT prophylaxis: SCDs Code Status: Full Family Communication: Updated patient and daughter at bedside. Disposition:   Status is: Inpatient  Remains inpatient appropriate because:Inpatient level of care appropriate due to severity of illness  Dispo: The patient is from: Home              Anticipated d/c is to: Home              Patient currently is not medically stable to d/c.   Difficult to place patient No       Consultants:  General surgery: Dr. Dema Severin 11/17/2020  Procedures:  CT abdomen and pelvis 11/17/2020, 11/19/2020 Abdominal films 11/18/2020, 11/19/2020   Antimicrobials: None   Subjective: Feeloing better. Abdominal pain improved. Passing flatys. No BM.  No nausea or emesis.  Daughter at bedside.  Objective: Vitals:   11/19/20 0458 11/19/20 1517 11/19/20 2141 11/20/20  0437  BP: 129/72 135/75 139/74 131/87  Pulse: 91 85 98 77  Resp: 18 16 20 18   Temp: 98.1 F (36.7 C) 98.2 F (36.8 C)  97.9 F (36.6 C)  TempSrc: Oral Oral  Oral  SpO2: 95% 93% 94% 95%  Weight:      Height:        Intake/Output  Summary (Last 24 hours) at 11/20/2020 1234 Last data filed at 11/20/2020 1155 Gross per 24 hour  Intake 766.93 ml  Output 1150 ml  Net -383.07 ml    Filed Weights   11/17/20 1246  Weight: 86.2 kg    Examination:  General exam: : NAD.  NG tube in place. Respiratory system: CTA B anterior lung fields.  No wheezes, no rhonchi.  Speaking in full sentences.  Normal respiratory effort. Cardiovascular system: Regular rate and rhythm no murmurs rubs or gallops.  No JVD.  No lower extremity edema.  Gastrointestinal system: Abdomen soft, nontender, nondistended, hypoactive bowel sounds, no rebound, no guarding.  Central nervous system: Alert and oriented. No focal neurological deficits. Extremities: Symmetric 5 x 5 power. Skin: No rashes, lesions or ulcers Psychiatry: Judgement and insight appear normal. Mood & affect appropriate.  Data Reviewed: I have personally reviewed following labs and imaging studies  CBC: Recent Labs  Lab 11/17/20 1312 11/19/20 0451 11/20/20 0509  WBC 8.8 9.7 8.3  NEUTROABS 4.5  --  4.7  HGB 13.3 13.0 13.1  HCT 39.7 40.3 40.3  MCV 87.6 89.2 89.4  PLT 454* 405* 429*     Basic Metabolic Panel: Recent Labs  Lab 11/17/20 1312 11/18/20 0455 11/19/20 0451 11/20/20 0509  NA 137 138 138 140  K 3.9 3.8 3.9 3.6  CL 102 104 102 103  CO2 26 25 28 28   GLUCOSE 135* 82 120* 94  BUN 23* 22* 19 19  CREATININE 0.84 0.93 0.85 0.82  CALCIUM 9.1 9.1 9.2 9.4  MG  --   --   --  2.2     GFR: Estimated Creatinine Clearance: 84.4 mL/min (by C-G formula based on SCr of 0.82 mg/dL).  Liver Function Tests: Recent Labs  Lab 11/17/20 1312  AST 20  ALT 16  ALKPHOS 54  BILITOT 0.4  PROT 8.5*  ALBUMIN 4.2     CBG: Recent Labs  Lab 11/19/20 0824 11/19/20 1447 11/19/20 1824 11/19/20 2142 11/20/20 0734  GLUCAP 102* 104* 92 92 92      Recent Results (from the past 240 hour(s))  Resp Panel by RT-PCR (Flu A&B, Covid) Nasopharyngeal Swab     Status: None    Collection Time: 11/17/20  3:59 PM   Specimen: Nasopharyngeal Swab; Nasopharyngeal(NP) swabs in vial transport medium  Result Value Ref Range Status   SARS Coronavirus 2 by RT PCR NEGATIVE NEGATIVE Final    Comment: (NOTE) SARS-CoV-2 target nucleic acids are NOT DETECTED.  The SARS-CoV-2 RNA is generally detectable in upper respiratory specimens during the acute phase of infection. The lowest concentration of SARS-CoV-2 viral copies this assay can detect is 138 copies/mL. A negative result does not preclude SARS-Cov-2 infection and should not be used as the sole basis for treatment or other patient management decisions. A negative result may occur with  improper specimen collection/handling, submission of specimen other than nasopharyngeal swab, presence of viral mutation(s) within the areas targeted by this assay, and inadequate number of viral copies(<138 copies/mL). A negative result must be combined with clinical observations, patient history, and epidemiological information. The expected result is Negative.  Fact Sheet for Patients:  EntrepreneurPulse.com.au  Fact Sheet for Healthcare Providers:  IncredibleEmployment.be  This test is no t yet approved or cleared by the [place] FDA and  has been authorized for detection and/or diagnosis of SARS-CoV-2 by FDA under an Emergency Use Authorization (EUA). This EUA will remain  in effect (meaning this test can be used) for the duration of the COVID-19 declaration under Section 564(b)(1) of the Act, 21 U.S.C.section 360bbb-3(b)(1), unless the authorization is terminated  or revoked sooner.       Influenza A by PCR NEGATIVE NEGATIVE Final   Influenza B by PCR NEGATIVE NEGATIVE Final    Comment: (NOTE) The Xpert Xpress SARS-CoV-2/FLU/RSV plus assay is intended as an aid in the diagnosis of influenza from Nasopharyngeal swab specimens and should not be used as a sole basis for treatment.  Nasal washings and aspirates are unacceptable for Xpert Xpress SARS-CoV-2/FLU/RSV testing.  Fact Sheet for Patients: EntrepreneurPulse.com.au  Fact Sheet for Healthcare Providers: IncredibleEmployment.be  This test is not yet approved or cleared by the [place] FDA and has been authorized for detection and/or diagnosis of SARS-CoV-2 by FDA under an Emergency Use Authorization (EUA). This EUA will remain in effect (meaning this test can be used) for the duration of the COVID-19 declaration under Section 564(b)(1) of the Act, 21 U.S.C. section 360bbb-3(b)(1), unless the authorization is terminated or revoked.  Performed at [facility], [address] [place], [place] [address]           Radiology Studies: DG Abd 1 View  Result Date: 11/19/2020 CLINICAL DATA:  50 year old female status post nasogastric tube placement. EXAM: ABDOMEN - 1 VIEW COMPARISON:  Abdominal radiograph 11/18/2020. FINDINGS: New nasogastric tube in position, coiled with tip in the proximal aspect of the stomach. Visualized bowel gas pattern appears nonobstructive. No frank pneumoperitoneum noted on this single supine view. I do needed contrast material is noted within the lumen of the urinary bladder. Peripherally calcified structures are noted in the lower abdomen and anatomic pelvis corresponding to calcified fibroids on recent CT examination. IUD noted. IMPRESSION: 1. Tip of nasogastric tube is coiled in the proximal stomach. Electronically Signed   By: Vinnie Langton M.D.   On: 11/19/2020 05:59   CT ABDOMEN PELVIS W CONTRAST  Result Date: 11/19/2020 CLINICAL DATA:  Abdominal pain, recent admission for small bowel obstruction EXAM: CT ABDOMEN AND PELVIS WITH CONTRAST TECHNIQUE: Multidetector CT imaging of the abdomen and pelvis was performed using the standard protocol following bolus administration of intravenous contrast. CONTRAST:  115mL OMNIPAQUE IOHEXOL  300 MG/ML  SOLN COMPARISON:  11/17/2020 FINDINGS: Lower chest: Lung bases are clear. Hepatobiliary: Liver is within normal limits. Gallbladder sludge (series 2/image 24). No intrahepatic or extrahepatic ductal dilatation. Pancreas: Within normal limits. Spleen: Within normal limits. Adrenals/Urinary Tract: Adrenal glands are within normal limits. Kidneys are within normal limits.  No hydronephrosis. Bladder is within normal limits. Stomach/Bowel: Stomach is notable for a tiny hiatal hernia. Again noted is a dilated loop of small bowel in the right mid abdomen (series 2/image 33), with transition in the left mid abdomen (series 2/image 38), suspicious for partial small bowel obstruction on the basis of adhesions. No small bowel wall thickening or pneumatosis. No free air. Colon is not decompressed. Vascular/Lymphatic: No evidence of abdominal aortic aneurysm. Atherosclerotic calcifications of the abdominal aorta and branch vessels. No suspicious abdominopelvic lymphadenopathy. Reproductive: Calcified uterine fibroids. IUD in the lower uterine body. No adnexal masses. Other: No abdominopelvic ascites. Musculoskeletal:  Visualized osseous structures are within normal limits. IMPRESSION: Dilated loop small bowel in the right mid abdomen with transition, suggesting partial small bowel obstruction, likely on the basis of adhesions. No small bowel wall thickening or pneumatosis.  No free air. Electronically Signed   By: Julian Hy M.D.   On: 11/19/2020 00:54   DG Abd 2 Views  Result Date: 11/20/2020 CLINICAL DATA:  Abdominal pain. EXAM: ABDOMEN - 2 VIEW COMPARISON:  November 19, 2020. FINDINGS: The bowel gas pattern is normal. Distal tip of nasogastric tube is seen in proximal stomach. There is no evidence of free air. Calcified uterine fibroids are noted in the pelvis. IMPRESSION: No abnormal bowel dilatation. Electronically Signed   By: Marijo Conception M.D.   On: 11/20/2020 11:33        Scheduled Meds:   insulin aspart  0-9 Units Subcutaneous TID WC   Continuous Infusions:  lactated ringers 100 mL/hr at 11/20/20 0444   potassium chloride       LOS: 3 days    Time spent: 35 minutes    Irine Seal, MD [facility]   To contact the attending provider between 7A-7P or the covering provider during after hours 7P-7A, please log into the web site [email or web address] and access using universal [place] password for that web site. If you do not have the password, please call the hospital operator.  11/20/2020, 12:34 PM

## 2020-11-20 NOTE — Progress Notes (Signed)
Assessment & Plan: HD#3 - partial SBO, recurrent             Hx of ex lap with LOA by Dr. Autumn Messing, Jan 2021             Recurrent episodes of SBO             AXR this AM with normal bowel gas pattern  No nausea or emesis - no pain at present  Will clamp NG tube and allow clear liquid diet today        [name], MD       [facility]       Office: [phone]   Chief Complaint: SBO  Subjective: Patient in bed, comfortable.  NG with small bilious.  Objective: Vital signs in last 24 hours: Temp:  [97.9 F (36.6 C)-99 F (37.2 C)] 99 F (37.2 C) (07/07 1313) Pulse Rate:  [77-98] 84 (07/07 1313) Resp:  [15-20] 15 (07/07 1313) BP: (131-139)/(74-87) 137/82 (07/07 1313) SpO2:  [93 %-97 %] 97 % (07/07 1313) Last BM Date: 11/17/20  Intake/Output from previous day: 07/06 0701 - 07/07 0700 In: 766.9 [I.V.:766.9] Out: 950 [Urine:800; Emesis/NG output:150] Intake/Output this shift: Total I/O In: -  Out: 700 [Urine:700]  Physical Exam: HEENT - sclerae clear, mucous membranes moist Neck - soft Chest - clear bilaterally Cor - RRR Abdomen - soft without distension; few BS present; non-tender Ext - no edema, non-tender Neuro - alert & oriented, no focal deficits  Lab Results:  Recent Labs    11/19/20 0451 11/20/20 0509  WBC 9.7 8.3  HGB 13.0 13.1  HCT 40.3 40.3  PLT 405* 429*   BMET Recent Labs    11/19/20 0451 11/20/20 0509  NA 138 140  K 3.9 3.6  CL 102 103  CO2 28 28  GLUCOSE 120* 94  BUN 19 19  CREATININE 0.85 0.82  CALCIUM 9.2 9.4   PT/INR No results for input(s): LABPROT, INR in the last 72 hours. Comprehensive Metabolic Panel:    Component Value Date/Time   NA 140 11/20/2020 0509   NA 138 11/19/2020 0451   NA 138 08/21/2012 1002   K 3.6 11/20/2020 0509   K 3.9 11/19/2020 0451   K 3.5 08/21/2012 1002   CL 103 11/20/2020 0509   CL 102 11/19/2020 0451   CL 102 08/21/2012 1002   CO2 28 11/20/2020 0509   CO2 28  11/19/2020 0451   CO2 28 08/21/2012 1002   BUN 19 11/20/2020 0509   BUN 19 11/19/2020 0451   BUN 16.7 08/21/2012 1002   CREATININE 0.82 11/20/2020 0509   CREATININE 0.85 11/19/2020 0451   CREATININE 0.83 12/03/2013 1112   CREATININE 0.83 04/13/2013 1022   CREATININE 1.0 08/21/2012 1002   GLUCOSE 94 11/20/2020 0509   GLUCOSE 120 (H) 11/19/2020 0451   GLUCOSE 132 (H) 08/21/2012 1002   CALCIUM 9.4 11/20/2020 0509   CALCIUM 9.2 11/19/2020 0451   CALCIUM 8.9 08/21/2012 1002   AST 20 11/17/2020 1312   AST 23 10/03/2020 1300   AST 10 08/21/2012 1002   ALT 16 11/17/2020 1312   ALT 19 10/03/2020 1300   ALT 8 08/21/2012 1002   ALKPHOS 54 11/17/2020 1312   ALKPHOS 55 10/03/2020 1300   ALKPHOS 60 08/21/2012 1002   BILITOT 0.4 11/17/2020 1312   BILITOT 0.6 10/03/2020 1300   BILITOT 0.22 08/21/2012 1002   PROT 8.5 (H) 11/17/2020 1312   PROT 7.9 10/03/2020  1300   PROT 7.6 08/21/2012 1002   ALBUMIN 4.2 11/17/2020 1312   ALBUMIN 3.8 10/03/2020 1300   ALBUMIN 3.3 (L) 08/21/2012 1002    Studies/Results: DG Abd 1 View  Result Date: 11/19/2020 CLINICAL DATA:  50 year old female status post nasogastric tube placement. EXAM: ABDOMEN - 1 VIEW COMPARISON:  Abdominal radiograph 11/18/2020. FINDINGS: New nasogastric tube in position, coiled with tip in the proximal aspect of the stomach. Visualized bowel gas pattern appears nonobstructive. No frank pneumoperitoneum noted on this single supine view. I do needed contrast material is noted within the lumen of the urinary bladder. Peripherally calcified structures are noted in the lower abdomen and anatomic pelvis corresponding to calcified fibroids on recent CT examination. IUD noted. IMPRESSION: 1. Tip of nasogastric tube is coiled in the proximal stomach. Electronically Signed   By: Vinnie Langton M.D.   On: 11/19/2020 05:59   CT ABDOMEN PELVIS W CONTRAST  Result Date: 11/19/2020 CLINICAL DATA:  Abdominal pain, recent admission for small bowel  obstruction EXAM: CT ABDOMEN AND PELVIS WITH CONTRAST TECHNIQUE: Multidetector CT imaging of the abdomen and pelvis was performed using the standard protocol following bolus administration of intravenous contrast. CONTRAST:  114mL OMNIPAQUE IOHEXOL 300 MG/ML  SOLN COMPARISON:  11/17/2020 FINDINGS: Lower chest: Lung bases are clear. Hepatobiliary: Liver is within normal limits. Gallbladder sludge (series 2/image 24). No intrahepatic or extrahepatic ductal dilatation. Pancreas: Within normal limits. Spleen: Within normal limits. Adrenals/Urinary Tract: Adrenal glands are within normal limits. Kidneys are within normal limits.  No hydronephrosis. Bladder is within normal limits. Stomach/Bowel: Stomach is notable for a tiny hiatal hernia. Again noted is a dilated loop of small bowel in the right mid abdomen (series 2/image 33), with transition in the left mid abdomen (series 2/image 38), suspicious for partial small bowel obstruction on the basis of adhesions. No small bowel wall thickening or pneumatosis. No free air. Colon is not decompressed. Vascular/Lymphatic: No evidence of abdominal aortic aneurysm. Atherosclerotic calcifications of the abdominal aorta and branch vessels. No suspicious abdominopelvic lymphadenopathy. Reproductive: Calcified uterine fibroids. IUD in the lower uterine body. No adnexal masses. Other: No abdominopelvic ascites. Musculoskeletal: Visualized osseous structures are within normal limits. IMPRESSION: Dilated loop small bowel in the right mid abdomen with transition, suggesting partial small bowel obstruction, likely on the basis of adhesions. No small bowel wall thickening or pneumatosis.  No free air. Electronically Signed   By: Julian Hy M.D.   On: 11/19/2020 00:54   DG Abd 2 Views  Result Date: 11/20/2020 CLINICAL DATA:  Abdominal pain. EXAM: ABDOMEN - 2 VIEW COMPARISON:  November 19, 2020. FINDINGS: The bowel gas pattern is normal. Distal tip of nasogastric tube is seen in  proximal stomach. There is no evidence of free air. Calcified uterine fibroids are noted in the pelvis. IMPRESSION: No abnormal bowel dilatation. Electronically Signed   By: Marijo Conception M.D.   On: 11/20/2020 11:33      [name] 11/20/2020   Patient ID: Susan Whitaker, female   DOB: 1971/02/02, 50 y.o.   MRN: [id number]

## 2020-11-21 LAB — BASIC METABOLIC PANEL
Anion gap: 10 (ref 5–15)
BUN: 12 mg/dL (ref 6–20)
CO2: 28 mmol/L (ref 22–32)
Calcium: 8.9 mg/dL (ref 8.9–10.3)
Chloride: 100 mmol/L (ref 98–111)
Creatinine, Ser: 0.8 mg/dL (ref 0.44–1.00)
GFR, Estimated: >60 mL/min (ref >60)
Glucose, Bld: 101 mg/dL — ABNORMAL HIGH (ref 70–99)
Potassium: 3.4 mmol/L — ABNORMAL LOW (ref 3.5–5.1)
Sodium: 138 mmol/L (ref 135–145)

## 2020-11-21 LAB — MAGNESIUM: Magnesium: 2.1 mg/dL (ref 1.7–2.4)

## 2020-11-21 LAB — GLUCOSE, CAPILLARY
Glucose-Capillary: 89 mg/dL (ref 70–99)
Glucose-Capillary: 116 mg/dL — ABNORMAL HIGH (ref 70–99)
Glucose-Capillary: 134 mg/dL — ABNORMAL HIGH (ref 70–99)
Glucose-Capillary: 101 mg/dL — ABNORMAL HIGH (ref 70–99)

## 2020-11-21 MED ORDER — POTASSIUM CHLORIDE CRYS ER 20 MEQ PO TBCR
40.0000 meq | EXTENDED_RELEASE_TABLET | Freq: Once | ORAL | Status: AC
  Administered 2020-11-21: 40 meq via ORAL
  Filled 2020-11-21: qty 2

## 2020-11-21 MED ORDER — METOPROLOL TARTRATE 50 MG PO TABS
50.0000 mg | ORAL_TABLET | Freq: Two times a day (BID) | ORAL | Status: DC
  Administered 2020-11-21 – 2020-11-22 (×3): 50 mg via ORAL
  Filled 2020-11-21 (×3): qty 1

## 2020-11-21 NOTE — Progress Notes (Signed)
Assessment & Plan: HD#4 - partial SBO, recurrent             Hx of ex lap with LOA by Dr. Autumn Messing, Jan 2021             Recurrent episodes of SBO             NG clamped overnight - well tolerated - discontinue NG this AM  Full liquid diet this AM  Ambulate in halls        [name], MD       [facility]       Office: [phone]   Chief Complaint: SBO  Subjective: Patient without nausea or pain overnight with NG clamped, tolerating CL diet  Objective: Vital signs in last 24 hours: Temp:  [98.9 F (37.2 C)-99.1 F (37.3 C)] 98.9 F (37.2 C) (07/08 0515) Pulse Rate:  [81-86] 81 (07/08 0515) Resp:  [15-18] 18 (07/08 0515) BP: (137-141)/(78-87) 141/78 (07/08 0515) SpO2:  [94 %-98 %] 98 % (07/08 0515) Last BM Date: 11/17/20  Intake/Output from previous day: 07/07 0701 - 07/08 0700 In: 1823.3 [P.O.:120; I.V.:1546.8; IV Piggyback:156.4] Out: 700 [Urine:700] Intake/Output this shift: No intake/output data recorded.  Physical Exam: HEENT - sclerae clear, mucous membranes moist Neck - soft Abdomen - soft, non-tender, no mass Ext - no edema, non-tender Neuro - alert & oriented, no focal deficits  Lab Results:  Recent Labs    11/19/20 0451 11/20/20 0509  WBC 9.7 8.3  HGB 13.0 13.1  HCT 40.3 40.3  PLT 405* 429*   BMET Recent Labs    11/20/20 0509 11/21/20 0527  NA 140 138  K 3.6 3.4*  CL 103 100  CO2 28 28  GLUCOSE 94 101*  BUN 19 12  CREATININE 0.82 0.80  CALCIUM 9.4 8.9   PT/INR No results for input(s): LABPROT, INR in the last 72 hours. Comprehensive Metabolic Panel:    Component Value Date/Time   NA 138 11/21/2020 0527   NA 140 11/20/2020 0509   NA 138 08/21/2012 1002   K 3.4 (L) 11/21/2020 0527   K 3.6 11/20/2020 0509   K 3.5 08/21/2012 1002   CL 100 11/21/2020 0527   CL 103 11/20/2020 0509   CL 102 08/21/2012 1002   CO2 28 11/21/2020 0527   CO2 28 11/20/2020 0509   CO2 28 08/21/2012 1002   BUN 12 11/21/2020  0527   BUN 19 11/20/2020 0509   BUN 16.7 08/21/2012 1002   CREATININE 0.80 11/21/2020 0527   CREATININE 0.82 11/20/2020 0509   CREATININE 0.83 12/03/2013 1112   CREATININE 0.83 04/13/2013 1022   CREATININE 1.0 08/21/2012 1002   GLUCOSE 101 (H) 11/21/2020 0527   GLUCOSE 94 11/20/2020 0509   GLUCOSE 132 (H) 08/21/2012 1002   CALCIUM 8.9 11/21/2020 0527   CALCIUM 9.4 11/20/2020 0509   CALCIUM 8.9 08/21/2012 1002   AST 20 11/17/2020 1312   AST 23 10/03/2020 1300   AST 10 08/21/2012 1002   ALT 16 11/17/2020 1312   ALT 19 10/03/2020 1300   ALT 8 08/21/2012 1002   ALKPHOS 54 11/17/2020 1312   ALKPHOS 55 10/03/2020 1300   ALKPHOS 60 08/21/2012 1002   BILITOT 0.4 11/17/2020 1312   BILITOT 0.6 10/03/2020 1300   BILITOT 0.22 08/21/2012 1002   PROT 8.5 (H) 11/17/2020 1312   PROT 7.9 10/03/2020 1300   PROT 7.6 08/21/2012 1002   ALBUMIN 4.2 11/17/2020 1312   ALBUMIN 3.8  10/03/2020 1300   ALBUMIN 3.3 (L) 08/21/2012 1002    Studies/Results: DG Abd 2 Views  Result Date: 11/20/2020 CLINICAL DATA:  Abdominal pain. EXAM: ABDOMEN - 2 VIEW COMPARISON:  November 19, 2020. FINDINGS: The bowel gas pattern is normal. Distal tip of nasogastric tube is seen in proximal stomach. There is no evidence of free air. Calcified uterine fibroids are noted in the pelvis. IMPRESSION: No abnormal bowel dilatation. Electronically Signed   By: Marijo Conception M.D.   On: 11/20/2020 11:33      [name] 11/21/2020   Patient ID: Susan Whitaker, female   DOB: 1970/12/16, 50 y.o.   MRN: [id number]

## 2020-11-21 NOTE — Progress Notes (Signed)
PROGRESS NOTE    Susan Whitaker  ZWC:[id number] DOB: 1971/04/26 DOA: 11/17/2020 PCP: Flossie Buffy, NP    No chief complaint on file.   Brief Narrative:  Patient is a female with history of hypertension, SVT status post ablation 09/2020, primary hyperaldosteronism, diabetes type 2, history of recurrent SBO, GERD, anxiety presented with abdominal pain.  Per patient, she had a chicken breast at [employer] and about an hour later started having sharp abdominal pain.  No nausea vomiting or diarrhea.  Patient has history of recurrent SBO in the past and h, was admitted with SBO and 05/2019 requiring ex lap lysis of adhesions.  Then in 09/14/20-09/17/2020 she was hospitalized for mesenteric volvulus, responded spontaneously.  On 10/03/2020, underwent ablation for the SVT and sharply postprocedure, developed acute onset of sharp midline abdominal pain..  CT abdomen at that time showed no acute findings.  Patient has seen GI and general surgery after pulmonary had no diagnoses and was referred to [facility]. CT abdomen pelvis showed dilated loops of small bowel consistent with low-grade partial SBO.  General surgery was consulted.  Patient was kept n.p.o. with NG tube as she was not having active vomiting. -Patient initially was improving clinically diet was being advanced patient subsequently developed recurrent partial small bowel obstruction and NG tube had to be placed back in.   Assessment & Plan:   Principal Problem:   SBO (small bowel obstruction) (HCC) Active Problems:   Essential hypertension   SVT (supraventricular tachycardia) (HCC)   S/P radiofrequency ablation operation for arrhythmia 10/03/20   Type 2 diabetes mellitus (HCC)   Partial intestinal obstruction (HCC)   1 recurrent partial small bowel obstruction -Patient initially was improving clinically however the evening of 11/18/2020 -11/19/2020, patient developed nausea, induced emesis, worsening abdominal pain. -Repeat CT abdomen and  pelvis consistent with partial small bowel obstruction felt secondary to adhesions. -Passing flatus.  No bowel movements today. -NG tube has been discontinued per general surgery this morning.  -Tolerated clears.  -Abdominal films with normal bowel gas pattern. -Diet has been advanced to a full liquid diet. -Continue hydration. -Continue current pain regimen of IV Dilaudid for severe pain, IV Toradol as needed. -K-Dur 40 mEq p.o. x1, goal to keep potassium approximately at 4. -Magnesium at 2.1. -Mobilize. -Per general surgery.  2.  Hypertension -BP currently stable.   -NG tube has been discontinued.   -Tolerated clears and has been upgraded to a full liquid diet.   -Resume home regimen beta-blocker.    3.  History of SVT status post radiofrequency ablation 10/03/2020 -Stable.  4.  Hyperlipidemia -Statin on hold and will resume on discharge.   5.  Well-controlled diabetes mellitus type 2 -Hemoglobin A1c 5.5 (09/23/2020) -CBG 89 this morning.   -Was on clears and has been upgraded to full liquids. -SSI.   6.  Obesity   DVT prophylaxis: SCDs Code Status: Full Family Communication: Updated patient.no family at bedside. Disposition:   Status is: Inpatient  Remains inpatient appropriate because:Inpatient level of care appropriate due to severity of illness  Dispo: The patient is from: Home              Anticipated d/c is to: Home              Patient currently is not medically stable to d/c.   Difficult to place patient No       Consultants:  General surgery: Dr. Dema Severin 11/17/2020  Procedures:  CT abdomen and pelvis 11/17/2020, 11/19/2020 Abdominal  films 11/18/2020, 11/19/2020   Antimicrobials: None   Subjective: Sitting up in chair.  NG tube has been discontinued.  Overall feeling better.  Denies any further nausea or emesis.  Denies any significant abdominal pain.  Positive flatus.  No bowel movement yet but feels will have 1 today.  Ambulating in hallway.  Tolerated  clears.   Objective: Vitals:   11/20/20 0437 11/20/20 1313 11/20/20 2043 11/21/20 0515  BP: 131/87 137/82 139/87 (!) 141/78  Pulse: 77 84 86 81  Resp: 18 15 18 18   Temp: 97.9 F (36.6 C) 99 F (37.2 C) 99.1 F (37.3 C) 98.9 F (37.2 C)  TempSrc: Oral Oral Oral Oral  SpO2: 95% 97% 94% 98%  Weight:      Height:        Intake/Output Summary (Last 24 hours) at 11/21/2020 1317 Last data filed at 11/20/2020 2333 Gross per 24 hour  Intake 1823.25 ml  Output --  Net 1823.25 ml    Filed Weights   11/17/20 1246  Weight: 86.2 kg    Examination:  General exam: : NAD Respiratory system: CTA B.  No wheezes, no rhonchi.  Speaking in full sentences.  Normal respiratory effort. Cardiovascular system: Regular rate and rhythm no murmurs rubs or gallops.  No JVD.  No lower extremity edema.  Gastrointestinal system: Abdomen soft, nontender, nondistended, positive bowel sounds.  No rebound.  No guarding. Central nervous system: Alert and oriented. No focal neurological deficits. Extremities: Symmetric 5 x 5 power. Skin: No rashes, lesions or ulcers Psychiatry: Judgement and insight appear normal. Mood & affect appropriate.  Data Reviewed: I have personally reviewed following labs and imaging studies  CBC: Recent Labs  Lab 11/17/20 1312 11/19/20 0451 11/20/20 0509  WBC 8.8 9.7 8.3  NEUTROABS 4.5  --  4.7  HGB 13.3 13.0 13.1  HCT 39.7 40.3 40.3  MCV 87.6 89.2 89.4  PLT 454* 405* 429*     Basic Metabolic Panel: Recent Labs  Lab 11/17/20 1312 11/18/20 0455 11/19/20 0451 11/20/20 0509 11/21/20 0527  NA 137 138 138 140 138  K 3.9 3.8 3.9 3.6 3.4*  CL 102 104 102 103 100  CO2 26 25 28 28 28   GLUCOSE 135* 82 120* 94 101*  BUN 23* 22* 19 19 12   CREATININE 0.84 0.93 0.85 0.82 0.80  CALCIUM 9.1 9.1 9.2 9.4 8.9  MG  --   --   --  2.2 2.1     GFR: Estimated Creatinine Clearance: 86.6 mL/min (by C-G formula based on SCr of 0.8 mg/dL).  Liver Function Tests: Recent Labs   Lab 11/17/20 1312  AST 20  ALT 16  ALKPHOS 54  BILITOT 0.4  PROT 8.5*  ALBUMIN 4.2     CBG: Recent Labs  Lab 11/20/20 1244 11/20/20 1557 11/20/20 2154 11/21/20 0744 11/21/20 1131  GLUCAP 84 97 88 89 116*      Recent Results (from the past 240 hour(s))  Resp Panel by RT-PCR (Flu A&B, Covid) Nasopharyngeal Swab     Status: None   Collection Time: 11/17/20  3:59 PM   Specimen: Nasopharyngeal Swab; Nasopharyngeal(NP) swabs in vial transport medium  Result Value Ref Range Status   SARS Coronavirus 2 by RT PCR NEGATIVE NEGATIVE Final    Comment: (NOTE) SARS-CoV-2 target nucleic acids are NOT DETECTED.  The SARS-CoV-2 RNA is generally detectable in upper respiratory specimens during the acute phase of infection. The lowest concentration of SARS-CoV-2 viral copies this assay can detect is 138 copies/mL.  A negative result does not preclude SARS-Cov-2 infection and should not be used as the sole basis for treatment or other patient management decisions. A negative result may occur with  improper specimen collection/handling, submission of specimen other than nasopharyngeal swab, presence of viral mutation(s) within the areas targeted by this assay, and inadequate number of viral copies(<138 copies/mL). A negative result must be combined with clinical observations, patient history, and epidemiological information. The expected result is Negative.  Fact Sheet for Patients:  EntrepreneurPulse.com.au  Fact Sheet for Healthcare Providers:  IncredibleEmployment.be  This test is no t yet approved or cleared by the [place] FDA and  has been authorized for detection and/or diagnosis of SARS-CoV-2 by FDA under an Emergency Use Authorization (EUA). This EUA will remain  in effect (meaning this test can be used) for the duration of the COVID-19 declaration under Section 564(b)(1) of the Act, 21 U.S.C.section 360bbb-3(b)(1), unless the  authorization is terminated  or revoked sooner.       Influenza A by PCR NEGATIVE NEGATIVE Final   Influenza B by PCR NEGATIVE NEGATIVE Final    Comment: (NOTE) The Xpert Xpress SARS-CoV-2/FLU/RSV plus assay is intended as an aid in the diagnosis of influenza from Nasopharyngeal swab specimens and should not be used as a sole basis for treatment. Nasal washings and aspirates are unacceptable for Xpert Xpress SARS-CoV-2/FLU/RSV testing.  Fact Sheet for Patients: EntrepreneurPulse.com.au  Fact Sheet for Healthcare Providers: IncredibleEmployment.be  This test is not yet approved or cleared by the [place] FDA and has been authorized for detection and/or diagnosis of SARS-CoV-2 by FDA under an Emergency Use Authorization (EUA). This EUA will remain in effect (meaning this test can be used) for the duration of the COVID-19 declaration under Section 564(b)(1) of the Act, 21 U.S.C. section 360bbb-3(b)(1), unless the authorization is terminated or revoked.  Performed at [facility], [address] [place], [place] [address]           Radiology Studies: DG Abd 2 Views  Result Date: 11/20/2020 CLINICAL DATA:  Abdominal pain. EXAM: ABDOMEN - 2 VIEW COMPARISON:  November 19, 2020. FINDINGS: The bowel gas pattern is normal. Distal tip of nasogastric tube is seen in proximal stomach. There is no evidence of free air. Calcified uterine fibroids are noted in the pelvis. IMPRESSION: No abnormal bowel dilatation. Electronically Signed   By: Marijo Conception M.D.   On: 11/20/2020 11:33        Scheduled Meds:  insulin aspart  0-9 Units Subcutaneous TID WC   metoprolol tartrate  50 mg Oral BID   Continuous Infusions:  lactated ringers 100 mL/hr at 11/21/20 1006     LOS: 4 days    Time spent: 35 minutes    Irine Seal, MD [facility]   To contact the attending provider between 7A-7P or the covering provider during  after hours 7P-7A, please log into the web site [email or web address] and access using universal [place] password for that web site. If you do not have the password, please call the hospital operator.  11/21/2020, 1:17 PM

## 2020-11-22 DIAGNOSIS — E669 Obesity, unspecified: Secondary | ICD-10-CM

## 2020-11-22 DIAGNOSIS — E1169 Type 2 diabetes mellitus with other specified complication: Secondary | ICD-10-CM

## 2020-11-22 LAB — GLUCOSE, CAPILLARY
Glucose-Capillary: 111 mg/dL — ABNORMAL HIGH (ref 70–99)
Glucose-Capillary: 136 mg/dL — ABNORMAL HIGH (ref 70–99)

## 2020-11-22 LAB — BASIC METABOLIC PANEL
Anion gap: 8 (ref 5–15)
BUN: 10 mg/dL (ref 6–20)
CO2: 30 mmol/L (ref 22–32)
Calcium: 9.2 mg/dL (ref 8.9–10.3)
Chloride: 102 mmol/L (ref 98–111)
Creatinine, Ser: 0.74 mg/dL (ref 0.44–1.00)
GFR, Estimated: >60 mL/min (ref >60)
Glucose, Bld: 93 mg/dL (ref 70–99)
Potassium: 3.3 mmol/L — ABNORMAL LOW (ref 3.5–5.1)
Sodium: 140 mmol/L (ref 135–145)

## 2020-11-22 LAB — MAGNESIUM: Magnesium: 2 mg/dL (ref 1.7–2.4)

## 2020-11-22 MED ORDER — AMLODIPINE BESYLATE 10 MG PO TABS: 10.0000 mg | ORAL_TABLET | Freq: Every day | ORAL | 3 refills | Status: DC

## 2020-11-22 MED ORDER — POTASSIUM CHLORIDE CRYS ER 20 MEQ PO TBCR: 40.0000 meq | EXTENDED_RELEASE_TABLET | Freq: Once | ORAL | Status: DC

## 2020-11-22 MED ORDER — POTASSIUM CHLORIDE CRYS ER 20 MEQ PO TBCR
40.0000 meq | EXTENDED_RELEASE_TABLET | ORAL | Status: AC
  Administered 2020-11-22 (×2): 40 meq via ORAL
  Filled 2020-11-22 (×2): qty 2

## 2020-11-22 MED ORDER — SPIRONOLACTONE 100 MG PO TABS: 100.0000 mg | ORAL_TABLET | Freq: Every day | ORAL | 1 refills | Status: DC

## 2020-11-22 MED ORDER — TRULICITY 3 MG/0.5ML ~~LOC~~ SOAJ: 3.0000 mg | SUBCUTANEOUS | Status: DC

## 2020-11-22 NOTE — Progress Notes (Signed)
Patient discharged to home with family, discharge instructions reviewed with patient who verbalized understanding. 

## 2020-11-22 NOTE — Progress Notes (Signed)
Assessment & Plan: HD#5 - partial SBO, recurrent             Hx of ex lap with LOA by Dr. Autumn Messing, Jan 2021             Recurrent episodes of SBO             Having bowel movements  Tolerating full liquids  Advance to soft diet this morning  Ok to discharge home this afternoon if tolerating diet. Patient should continue a soft, low-residue diet for 4 weeks.  General surgery will sign off, please call with any further questions or concerns.    Chief Complaint: SBO  Subjective: Tolerating full liquids, no nausea or vomiting. No abdominal pain. Reports she is feeling much better and had 3 Bms.  Objective: Vital signs in last 24 hours: Temp:  [98.4 F (36.9 C)-99.2 F (37.3 C)] 98.7 F (37.1 C) (07/09 0425) Pulse Rate:  [67-83] 67 (07/09 0425) Resp:  [14-20] 16 (07/09 0425) BP: (115-140)/(82-91) 133/82 (07/09 0425) SpO2:  [98 %] 98 % (07/09 0425) Last BM Date: 11/22/20  Intake/Output from previous day: No intake/output data recorded. Intake/Output this shift: No intake/output data recorded.  Physical Exam: HEENT - sclerae clear, mucous membranes moist Neck - soft Abdomen - soft, non-tender, nondistended, well-healed midline scar Ext - no edema, non-tender Neuro - alert & oriented, no focal deficits  Lab Results:  Recent Labs    11/20/20 0509  WBC 8.3  HGB 13.1  HCT 40.3  PLT 429*   BMET Recent Labs    11/21/20 0527 11/22/20 0527  NA 138 140  K 3.4* 3.3*  CL 100 102  CO2 28 30  GLUCOSE 101* 93  BUN 12 10  CREATININE 0.80 0.74  CALCIUM 8.9 9.2   PT/INR No results for input(s): LABPROT, INR in the last 72 hours. Comprehensive Metabolic Panel:    Component Value Date/Time   NA 140 11/22/2020 0527   NA 138 11/21/2020 0527   NA 138 08/21/2012 1002   K 3.3 (L) 11/22/2020 0527   K 3.4 (L) 11/21/2020 0527   K 3.5 08/21/2012 1002   CL 102 11/22/2020 0527   CL 100 11/21/2020 0527   CL 102 08/21/2012 1002   CO2 30 11/22/2020 0527   CO2 28  11/21/2020 0527   CO2 28 08/21/2012 1002   BUN 10 11/22/2020 0527   BUN 12 11/21/2020 0527   BUN 16.7 08/21/2012 1002   CREATININE 0.74 11/22/2020 0527   CREATININE 0.80 11/21/2020 0527   CREATININE 0.83 12/03/2013 1112   CREATININE 0.83 04/13/2013 1022   CREATININE 1.0 08/21/2012 1002   GLUCOSE 93 11/22/2020 0527   GLUCOSE 101 (H) 11/21/2020 0527   GLUCOSE 132 (H) 08/21/2012 1002   CALCIUM 9.2 11/22/2020 0527   CALCIUM 8.9 11/21/2020 0527   CALCIUM 8.9 08/21/2012 1002   AST 20 11/17/2020 1312   AST 23 10/03/2020 1300   AST 10 08/21/2012 1002   ALT 16 11/17/2020 1312   ALT 19 10/03/2020 1300   ALT 8 08/21/2012 1002   ALKPHOS 54 11/17/2020 1312   ALKPHOS 55 10/03/2020 1300   ALKPHOS 60 08/21/2012 1002   BILITOT 0.4 11/17/2020 1312   BILITOT 0.6 10/03/2020 1300   BILITOT 0.22 08/21/2012 1002   PROT 8.5 (H) 11/17/2020 1312   PROT 7.9 10/03/2020 1300   PROT 7.6 08/21/2012 1002   ALBUMIN 4.2 11/17/2020 1312   ALBUMIN 3.8 10/03/2020 1300   ALBUMIN 3.3 (  L) 08/21/2012 1002    Studies/Results: DG Abd 2 Views  Result Date: 11/20/2020 CLINICAL DATA:  Abdominal pain. EXAM: ABDOMEN - 2 VIEW COMPARISON:  November 19, 2020. FINDINGS: The bowel gas pattern is normal. Distal tip of nasogastric tube is seen in proximal stomach. There is no evidence of free air. Calcified uterine fibroids are noted in the pelvis. IMPRESSION: No abnormal bowel dilatation. Electronically Signed   By: Marijo Conception M.D.   On: 11/20/2020 11:33      Michaelle Birks, [facility], Hepatobiliary and Pancreatic Surgery 11/22/20 8:00 AM

## 2020-11-22 NOTE — Discharge Summary (Signed)
Physician Discharge Summary  Susan Whitaker WHQ:[id number] DOB: 1970/06/24 DOA: 11/17/2020  PCP: Flossie Buffy, NP  Admit date: 11/17/2020 Discharge date: 11/22/2020  Time spent: 50 minutes  Recommendations for Outpatient Follow-up:  Follow-up with Dr. Johney Maine general surgery. Follow-up with Nche, Charlene Brooke, NP in 2 weeks.  On follow-up patient will need a basic metabolic profile done to follow-up on electrolytes and renal function.  Patient will also need a magnesium level checked.   Discharge Diagnoses:  Principal Problem:   SBO (small bowel obstruction) (HCC) Active Problems:   Essential hypertension   SVT (supraventricular tachycardia) (HCC)   S/P radiofrequency ablation operation for arrhythmia 10/03/20   Diabetes mellitus type 2 in obese [facility])   Partial intestinal obstruction ([place])   Discharge Condition: Stable and improved.  Diet recommendation: Soft low residue diet  Filed Weights   11/17/20 1246  Weight: 86.2 kg    History of present illness:  HPI per Dr. Audry Pili is a 50 y.o. female with medical history significant for HTN, SVT s/p ablation 10/03/2020, primary hyperaldosteronism, controlled type 2 diabetes, history of recurrent SBO, GERD and anxiety who presented with abdominal pain.   Patient stated that earlier this morning she had a chicken breast at [facility] and about an hour later began to have sharp abdominal pain.  At first thought that she could walk it off but decided to call her daughter to take her to the ER after pain progressed.  She denied any nausea, vomiting or diarrhea.   Patient has had history of recurrent SBO's in the past.  Stated that she had required abdominal surgery at 67 months of age for possible SBO versus volvulus.  In 05/2019 she was admitted with SBO requiring ex lap lysis of adhesion. 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   Then on 10/03/20 she underwent ablation for  her SVT and shortly postprocedural she developed acute onset sharp midline abdominal pain.  CT abdomen at the time showed no acute or significant findings.  There was some slight rotation of the mesentery but per radiology they doubt the significance of the finding.  Patient reports that her pain at that time was much more excruciating than the pain that she is currently feeling now. States she has seen general surgery and GI after that but could not get answers to her pain and has been referred to [facility]. She has not had another recurrent episode of pain since then until now.    ED Course: She was afebrile, tachycardic with heart rate up to 120s and normotensive on room air.  No leukocytosis or anemia.  Platelet of 454 which appears chronic.  CMP unremarkable.  BG 135.  CT of the abdomen and pelvis shows dilated loops of small bowel consistent with low-grade partial small bowel obstruction.  ED physician has discussed with general surgery Dr. Dema Severin who recommends patient be kept n.p.o. without NG tube at this time as she does not have any vomiting.  They will see in consultation in the morning.  Hospital Course:  1 recurrent partial small bowel obstruction -Patient initially was improving clinically however the evening of 11/18/2020 -11/19/2020, patient developed nausea, induced emesis, worsening abdominal pain. -Repeat CT abdomen and pelvis consistent with partial small bowel obstruction felt secondary to adhesions. -Patient was placed back on bowel rest and NG tube inserted.  -Patient improved clinically.  -Patient initially placed on a clamping trial and clear liquids which she tolerated.   -  NG tube subsequently discontinued.  -Abdominal films with normal bowel gas pattern. -Diet has been advanced to a full liquid diet which she tolerated and subsequently advanced to a soft diet. -Patient was having flatus and bowel movements by day of discharge and no further abdominal pain. -Electrolytes  repleted. -Patient was discharged in stable and improved condition with outpatient follow-up with general surgery.  2.  Hypertension -BP remained stable throughout the hospitalization.   -Patient initially kept n.p.o. due to small bowel obstruction/antihypertensive medications held.   -Blood pressure remained stable.   -Once patient started on a diet patient's beta-blocker was resumed.  -Patient's antihypertensive medications will be resumed gradually on discharge.    3.  History of SVT status post radiofrequency ablation 10/03/2020 -Patient initially n.p.o. and as such cardiac medications held. -Once patient improved clinically and started on a oral diet patient's metoprolol was resumed. -Cardiac medications will be resumed gradually on discharge per.  4.  Hyperlipidemia -Statin held during the hospitalization will be resumed on discharge.  5.  Well-controlled diabetes mellitus type 2 -Hemoglobin A1c 5.5 (09/23/2020) -Patient maintained on sliding scale insulin throughout the hospitalization.   -Outpatient follow-up.    6.  Obesity    Procedures: CT abdomen and pelvis 11/17/2020, 11/19/2020 Abdominal films 11/18/2020, 11/19/2020  Consultations: General surgery: Dr. Dema Severin 11/17/2020  Discharge Exam: Vitals:   11/22/20 0425 11/22/20 1332  BP: 133/82 127/79  Pulse: 67 71  Resp: 16 18  Temp: 98.7 F (37.1 C) 98.9 F (37.2 C)  SpO2: 98% 99%    General: NAD Cardiovascular: RRR Respiratory: CTA B.  No wheezes, no crackles, no rhonchi.  Normal respiratory.  Discharge Instructions   Discharge Instructions     Diet - low sodium heart healthy   Complete by: As directed    Soft low residue diet x 4 weeks.   Increase activity slowly   Complete by: As directed       Allergies as of 11/22/2020       Reactions   Lisinopril Cough        Medication List     TAKE these medications    acetaminophen 325 MG tablet Commonly known as: TYLENOL Take 2 tablets (650 mg total) by  mouth every 4 (four) hours as needed for headache or mild pain.   amLODipine 10 MG tablet Commonly known as: NORVASC Take 1 tablet (10 mg total) by mouth daily. Start taking on: November 24, 2020 What changed: These instructions start on November 24, 2020. If you are unsure what to do until then, ask your doctor or other care provider.   aspirin EC 81 MG tablet Take 1 tablet (81 mg total) by mouth daily.   atorvastatin 40 MG tablet Commonly known as: LIPITOR Take 1 tablet (40 mg total) by mouth daily.   canagliflozin 300 MG Tabs tablet Commonly known as: Invokana Take 1 tablet (300 mg total) by mouth daily before breakfast.   glucose blood test strip Commonly known as: Accu-Chek Guide 1 each by Other route 3 (three) times daily. Use as instructed to check once daily.   losartan 100 MG tablet Commonly known as: COZAAR Take 1 tablet (100 mg total) by mouth daily.   metoprolol tartrate 50 MG tablet Commonly known as: LOPRESSOR Take 1 tablet (50 mg total) by mouth 2 (two) times daily.   OneTouch Delica Lancets Fine Misc Use to check blood sugar 3 times daily   polyethylene glycol 17 g packet Commonly known as: MIRALAX / GLYCOLAX Take 17  g by mouth once a week.   spironolactone 100 MG tablet Commonly known as: ALDACTONE Take 1 tablet (100 mg total) by mouth daily. Start taking on: November 24, 2020 What changed: These instructions start on November 24, 2020. If you are unsure what to do until then, ask your doctor or other care provider.   Trulicity 3 TI/4.5YK Sopn Generic drug: Dulaglutide Inject 3 mg into the skin once a week. Sunday   ULTRAFLORA IMMUNE HEALTH PO Take 1 capsule by mouth daily.       Allergies  Allergen Reactions   Lisinopril Cough    Follow-up Information     Michael Boston, MD. Call.   Specialties: General Surgery, Colon and Rectal Surgery Why: To schedule a follow up appointment Contact information: [place]  [address] [phone]         Flossie Buffy, NP. Schedule an appointment as soon as possible for a visit in 2 week(s).   Specialty: Internal Medicine Contact information: [place] [address] [phone]         Fay Records, MD .   Specialty: Cardiology Contact information: [address] [place] [address] [phone]         Evans Lance, MD .   Specialty: Cardiology Contact information: [phone] [address] [place] [address] [phone]                  The results of significant diagnostics from this hospitalization (including imaging, microbiology, ancillary and laboratory) are listed below for reference.    Significant Diagnostic Studies: DG Abd 1 View  Result Date: 11/19/2020 CLINICAL DATA:  50 year old female status post nasogastric tube placement. EXAM: ABDOMEN - 1 VIEW COMPARISON:  Abdominal radiograph 11/18/2020. FINDINGS: New nasogastric tube in position, coiled with tip in the proximal aspect of the stomach. Visualized bowel gas pattern appears nonobstructive. No frank pneumoperitoneum noted on this single supine view. I do needed contrast material is noted within the lumen of the urinary bladder. Peripherally calcified structures are noted in the lower abdomen and anatomic pelvis corresponding to calcified fibroids on recent CT examination. IUD noted. IMPRESSION: 1. Tip of nasogastric tube is coiled in the proximal stomach. Electronically Signed   By: Vinnie Langton M.D.   On: 11/19/2020 05:59   DG Abd 1 View  Result Date: 11/18/2020 CLINICAL DATA:  RIGHT-side abdominal pain EXAM: ABDOMEN - 1 VIEW COMPARISON:  Portable exam 0844 hours compared to CT abdomen and pelvis 11/17/2020 FINDINGS: Excreted contrast material within bladder. Large calcified uterine leiomyomata with note of IUD. Nonobstructive bowel gas pattern. No bowel dilatation or bowel wall thickening. Osseous structures  unremarkable. IMPRESSION: Normal bowel gas pattern. Calcified uterine leiomyomata. Electronically Signed   By: Lavonia Dana M.D.   On: 11/18/2020 09:02   CT ABDOMEN PELVIS W CONTRAST  Result Date: 11/19/2020 CLINICAL DATA:  Abdominal pain, recent admission for small bowel obstruction EXAM: CT ABDOMEN AND PELVIS WITH CONTRAST TECHNIQUE: Multidetector CT imaging of the abdomen and pelvis was performed using the standard protocol following bolus administration of intravenous contrast. CONTRAST:  164mL OMNIPAQUE IOHEXOL 300 MG/ML  SOLN COMPARISON:  11/17/2020 FINDINGS: Lower chest: Lung bases are clear. Hepatobiliary: Liver is within normal limits. Gallbladder sludge (series 2/image 24). No intrahepatic or extrahepatic ductal dilatation. Pancreas: Within normal limits. Spleen: Within normal limits. Adrenals/Urinary Tract: Adrenal glands are within normal limits. Kidneys are within normal limits.  No hydronephrosis. Bladder  is within normal limits. Stomach/Bowel: Stomach is notable for a tiny hiatal hernia. Again noted is a dilated loop of small bowel in the right mid abdomen (series 2/image 33), with transition in the left mid abdomen (series 2/image 38), suspicious for partial small bowel obstruction on the basis of adhesions. No small bowel wall thickening or pneumatosis. No free air. Colon is not decompressed. Vascular/Lymphatic: No evidence of abdominal aortic aneurysm. Atherosclerotic calcifications of the abdominal aorta and branch vessels. No suspicious abdominopelvic lymphadenopathy. Reproductive: Calcified uterine fibroids. IUD in the lower uterine body. No adnexal masses. Other: No abdominopelvic ascites. Musculoskeletal: Visualized osseous structures are within normal limits. IMPRESSION: Dilated loop small bowel in the right mid abdomen with transition, suggesting partial small bowel obstruction, likely on the basis of adhesions. No small bowel wall thickening or pneumatosis.  No free air. Electronically  Signed   By: Julian Hy M.D.   On: 11/19/2020 00:54   CT ABDOMEN PELVIS W CONTRAST  Result Date: 11/17/2020 CLINICAL DATA:  Abdominal pain EXAM: CT ABDOMEN AND PELVIS WITH CONTRAST TECHNIQUE: Multidetector CT imaging of the abdomen and pelvis was performed using the standard protocol following bolus administration of intravenous contrast. CONTRAST:  138mL OMNIPAQUE IOHEXOL 300 MG/ML  SOLN COMPARISON:  10/27/2020 FINDINGS: Lower chest: No acute abnormality. Hepatobiliary: No focal liver abnormality is seen. No gallstones, gallbladder wall thickening, or biliary dilatation. Pancreas: Unremarkable. No pancreatic ductal dilatation or surrounding inflammatory changes. Spleen: Normal in size without focal abnormality. Adrenals/Urinary Tract: Normal adrenal glands. No kidney mass, stone or hydronephrosis. Stomach/Bowel: Normal appearance of the stomach. Colon is unremarkable. There are scattered mildly dilated loops of small bowel with air-fluid levels and intervening segments of normal caliber small bowel. For example, within the left lower quadrant of the abdomen there is a dilated small bowel loop measuring 2.2 cm, image 41/2. The terminal ileum appears increased in caliber with signs of fecalization. Proximal to the terminal ileum there is a normal caliber small bowel loops. No significant bowel wall thickening or inflammation. Vascular/Lymphatic: Aortic atherosclerosis. No aneurysm. No abdominopelvic adenopathy Reproductive: Enlarged fibroid uterus containing calcified and noncalcified fibroids no IUD is in place. No adnexal mass. Other: No free fluid or fluid collections. No signs of pneumoperitoneum. Musculoskeletal: 2 IMPRESSION: 1. There are scattered mildly dilated loops of small bowel with air-fluid levels and intervening segments of normal caliber small bowel. The terminal ileum appears increased in caliber with signs of fecalization, which can be seen with diminished small bowel transit. Findings may  reflect low-grade partial small bowel obstruction in this patient who has known adhesive disease. 2. Enlarged fibroid uterus. 3. Aortic atherosclerosis. Aortic Atherosclerosis (ICD10-I70.0). Electronically Signed   By: Kerby Moors M.D.   On: 11/17/2020 15:06   CT ENTERO ABD/PELVIS W CONTAST  Result Date: 10/27/2020 CLINICAL DATA:  Severe postprandial abdominal pain. Unintentional 20 lb weight loss. Previous small-bowel obstruction. EXAM: CT ABDOMEN AND PELVIS WITH CONTRAST (ENTEROGRAPHY) TECHNIQUE: Multidetector CT of the abdomen and pelvis during bolus administration of intravenous contrast. Negative oral contrast was given. CONTRAST:  140mL ISOVUE-300 IOPAMIDOL (ISOVUE-300) INJECTION 61% COMPARISON:  10/03/2020 FINDINGS: Lower Chest: No acute findings. Hepatobiliary: No hepatic masses identified. Gallbladder is unremarkable. No evidence of biliary ductal dilatation. Pancreas:  No mass or inflammatory changes. Spleen: Within normal limits in size and appearance. Adrenals/Urinary Tract: No masses identified. No evidence of ureteral calculi or hydronephrosis. Unremarkable unopacified urinary bladder. Stomach/Bowel: No evidence of bowel wall thickening, abnormal contrast enhancement, or dilatation. No evidence of mesenteric inflammatory changes,  enteric fistula, or abnormal fluid collections. The terminal ileum is normal in appearance. Vascular/Lymphatic: No pathologically enlarged lymph nodes. No acute vascular findings. Reproductive: Enlarged uterus is seen with multiple calcified uterine fibroids mainly in the fundal region, largest measuring 5 cm. IUD is seen within the endometrial cavity with tip in the lower to mid uterine corpus. Adnexal regions are unremarkable. Other:  None. Musculoskeletal:  No suspicious bone lesions identified. IMPRESSION: No radiographic evidence of inflammatory bowel disease or other acute findings. Enlarged uterus with multiple calcified uterine fibroids. Low position of IUD,  with tip in the mid to lower uterine corpus. Electronically Signed   By: Marlaine Hind M.D.   On: 10/27/2020 20:07   DG Abd 2 Views  Result Date: 11/20/2020 CLINICAL DATA:  Abdominal pain. EXAM: ABDOMEN - 2 VIEW COMPARISON:  November 19, 2020. FINDINGS: The bowel gas pattern is normal. Distal tip of nasogastric tube is seen in proximal stomach. There is no evidence of free air. Calcified uterine fibroids are noted in the pelvis. IMPRESSION: No abnormal bowel dilatation. Electronically Signed   By: Marijo Conception M.D.   On: 11/20/2020 11:33    Microbiology: Recent Results (from the past 240 hour(s))  Resp Panel by RT-PCR (Flu A&B, Covid) Nasopharyngeal Swab     Status: None   Collection Time: 11/17/20  3:59 PM   Specimen: Nasopharyngeal Swab; Nasopharyngeal(NP) swabs in vial transport medium  Result Value Ref Range Status   SARS Coronavirus 2 by RT PCR NEGATIVE NEGATIVE Final    Comment: (NOTE) SARS-CoV-2 target nucleic acids are NOT DETECTED.  The SARS-CoV-2 RNA is generally detectable in upper respiratory specimens during the acute phase of infection. The lowest concentration of SARS-CoV-2 viral copies this assay can detect is 138 copies/mL. A negative result does not preclude SARS-Cov-2 infection and should not be used as the sole basis for treatment or other patient management decisions. A negative result may occur with  improper specimen collection/handling, submission of specimen other than nasopharyngeal swab, presence of viral mutation(s) within the areas targeted by this assay, and inadequate number of viral copies(<138 copies/mL). A negative result must be combined with clinical observations, patient history, and epidemiological information. The expected result is Negative.  Fact Sheet for Patients:  EntrepreneurPulse.com.au  Fact Sheet for Healthcare Providers:  IncredibleEmployment.be  This test is no t yet approved or cleared by the [place] FDA and  has been authorized for detection and/or diagnosis of SARS-CoV-2 by FDA under an Emergency Use Authorization (EUA). This EUA will remain  in effect (meaning this test can be used) for the duration of the COVID-19 declaration under Section 564(b)(1) of the Act, 21 U.S.C.section 360bbb-3(b)(1), unless the authorization is terminated  or revoked sooner.       Influenza A by PCR NEGATIVE NEGATIVE Final   Influenza B by PCR NEGATIVE NEGATIVE Final    Comment: (NOTE) The Xpert Xpress SARS-CoV-2/FLU/RSV plus assay is intended as an aid in the diagnosis of influenza from Nasopharyngeal swab specimens and should not be used as a sole basis for treatment. Nasal washings and aspirates are unacceptable for Xpert Xpress SARS-CoV-2/FLU/RSV testing.  Fact Sheet for Patients: EntrepreneurPulse.com.au  Fact Sheet for Healthcare Providers: IncredibleEmployment.be  This test is not yet approved or cleared by the [place] FDA and has been authorized for detection and/or diagnosis of SARS-CoV-2 by FDA under an Emergency Use Authorization (EUA). This EUA will remain in effect (meaning this test can be used) for the duration of the COVID-19  declaration under Section 564(b)(1) of the Act, 21 U.S.C. section 360bbb-3(b)(1), unless the authorization is terminated or revoked.  Performed at [facility], [address] [place], [place] [address]      Labs: Basic Metabolic Panel: Recent Labs  Lab 11/18/20 0455 11/19/20 0451 11/20/20 0509 11/21/20 0527 11/22/20 0527  NA 138 138 140 138 140  K 3.8 3.9 3.6 3.4* 3.3*  CL 104 102 103 100 102  CO2 25 28 28 28 30   GLUCOSE 82 120* 94 101* 93  BUN 22* 19 19 12 10   CREATININE 0.93 0.85 0.82 0.80 0.74  CALCIUM 9.1 9.2 9.4 8.9 9.2  MG  --   --  2.2 2.1 2.0   Liver Function Tests: Recent Labs  Lab 11/17/20 1312  AST 20  ALT 16  ALKPHOS 54  BILITOT 0.4  PROT 8.5*  ALBUMIN 4.2    Recent Labs  Lab 11/17/20 1312  LIPASE 35   No results for input(s): AMMONIA in the last 168 hours. CBC: Recent Labs  Lab 11/17/20 1312 11/19/20 0451 11/20/20 0509  WBC 8.8 9.7 8.3  NEUTROABS 4.5  --  4.7  HGB 13.3 13.0 13.1  HCT 39.7 40.3 40.3  MCV 87.6 89.2 89.4  PLT 454* 405* 429*   Cardiac Enzymes: No results for input(s): CKTOTAL, CKMB, CKMBINDEX, TROPONINI in the last 168 hours. BNP: BNP (last 3 results) No results for input(s): BNP in the last 8760 hours.  ProBNP (last 3 results) No results for input(s): PROBNP in the last 8760 hours.  CBG: Recent Labs  Lab 11/21/20 1131 11/21/20 1641 11/21/20 2241 11/22/20 0743 11/22/20 1150  GLUCAP 116* 134* 101* 111* 136*       Signed:  Irine Seal MD.  [facility] 11/22/2020, 3:45 PM

## 2020-11-24 ENCOUNTER — Telehealth: Payer: Self-pay | Admitting: *Deleted

## 2020-11-24 NOTE — Telephone Encounter (Signed)
Transition Care Management Follow-up Telephone Call Date of discharge and from where: [facility] How have you been since you were released from the hospital? Feeling Better Any questions or concerns? No  Items Reviewed: Did the pt receive and understand the discharge instructions provided? Yes  Medications obtained and verified? Yes  Other? No  Any new allergies since your discharge? No  Dietary orders reviewed? No Do you have support at home? Yes   Home Care and Equipment/Supplies: Were home health services ordered? not applicable If so, what is the name of the agency?   Has the agency set up a time to come to the patient's home? not applicable Were any new equipment or medical supplies ordered?  No What is the name of the medical supply agency?  Were you able to get the supplies/equipment? not applicable Do you have any questions related to the use of the equipment or supplies? No  Functional Questionnaire: (I = Independent and D = Dependent) ADLs: I  Bathing/Dressing- I  Meal Prep- I  Eating- I  Maintaining continence- I  Transferring/Ambulation- I  Managing Meds- I  Follow up appointments reviewed:  PCP Hospital f/u appt confirmed? Yes  Scheduled to see 11-28-2020 Dr [facility] f/u appt confirmed? No   patient is going to call Dr. Johney Maine information given Are transportation arrangements needed? No  If their condition worsens, is the pt aware to call PCP or go to the Emergency Dept.? Yes Was the patient provided with contact information for the PCP's office or ED? Yes Was to pt encouraged to call back with questions or concerns? Yes

## 2020-11-25 ENCOUNTER — Ambulatory Visit: Payer: 59 | Admitting: Family Medicine

## 2020-11-28 ENCOUNTER — Ambulatory Visit (INDEPENDENT_AMBULATORY_CARE_PROVIDER_SITE_OTHER): Payer: 59 | Admitting: Family Medicine

## 2020-11-28 ENCOUNTER — Other Ambulatory Visit: Payer: Self-pay

## 2020-11-28 ENCOUNTER — Encounter: Payer: Self-pay | Admitting: Family Medicine

## 2020-11-28 VITALS — BP 130/80 | HR 3 | Temp 97.9°F | Ht 63.0 in | Wt 184.8 lb

## 2020-11-28 DIAGNOSIS — K56609 Unspecified intestinal obstruction, unspecified as to partial versus complete obstruction: Secondary | ICD-10-CM

## 2020-11-28 LAB — BASIC METABOLIC PANEL
BUN: 15 mg/dL (ref 6–23)
CO2: 28 mEq/L (ref 19–32)
Calcium: 9.4 mg/dL (ref 8.4–10.5)
Chloride: 103 mEq/L (ref 96–112)
Creatinine, Ser: 0.84 mg/dL (ref 0.40–1.20)
GFR: 80.28 mL/min (ref >60.00)
Glucose, Bld: 86 mg/dL (ref 70–99)
Potassium: 4.1 mEq/L (ref 3.5–5.1)
Sodium: 140 mEq/L (ref 135–145)

## 2020-11-28 LAB — MAGNESIUM: Magnesium: 2.4 mg/dL (ref 1.5–2.5)

## 2020-11-28 NOTE — Progress Notes (Signed)
[facility] [address] [place] [address] Dept: [phone] Dept Fax: [phone]  Office Visit  Subjective:    Patient ID: Susan Whitaker, female    DOB: Sep 24, 1970, 50 y.o..   MRN: [id number]  Chief Complaint  Patient presents with   Hospitalization [facility] f/u from 7422 for bowel obstruction. Reports feeling better.     History of Present Illness:  Patient is in today for hospital follow-up related to recurrent small bowel obstruction. She was admitted at [facility] from 7/5-7/9. This event occurred soon after having a radiofrequency ablation related to SVT. [name] notes that she has had 8-10 episodes of SBO in the past 2 years. She has a past history of volvulus as an infant. She did have abdominal surgery last Nov. for lysis of abdominal adhesions. She feels very frustrated that the cause of her SBO has not been fully elucidated and she feels ignored when she complains of pending episodes.  [name] also notes that she needs to have FMLA paperwork completed related to potential intermittent absences related to flares of her SBO and future appointments for further evaluation.  Past Medical History: Patient Active Problem List   Diagnosis Date Noted   Partial intestinal obstruction ([place])    Diabetes mellitus type 2 in obese ([place]) 11/17/2020   Abdominal pain 10/04/2020   S/P radiofrequency ablation operation for arrhythmia 10/03/20 10/04/2020   Volvulus ([place]) 09/15/2020   Chronic idiopathic constipation 09/14/2020   Abdominal bloating 09/14/2020   Hemorrhage of rectum and anus 09/14/2020   Obesity (BMI 30-39.9) 09/14/2020   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   Chronic narcotic use 09/14/2020   IUD (intrauterine device) in place 09/14/2020   Abnormal uterine bleeding due to adenomyosis 01/08/2020   Polyp of corpus uteri 01/08/2020   SBO (small bowel obstruction) ([place]) 06/02/2019   History of small bowel  obstruction 06/02/2019   Vitamin D deficiency 05/26/2019   Anxiety state 07/14/2012   Primary hyperaldosteronism ([place]) 02/21/2012   Microalbuminuria 02/15/2012   Multiple thyroid nodules 01/04/2012   SVT (supraventricular tachycardia) ([place]) 12/29/2011   Hypokalemia 11/28/2011   Cervical pain (neck) 05/28/2011   Hemorrhoids 02/07/2009   Diabetes mellitus type 2 with complications ([place]) 27/74/1287   ALLERGIC RHINITIS 05/24/2008   Dyslipidemia 04/22/2008   THROMBOCYTOSIS 04/22/2008   URINARY INCONTINENCE 04/22/2008   Essential hypertension 01/04/2007   GERD 01/04/2007   Past Surgical History:  Procedure Laterality Date   ABDOMINAL ADHESION SURGERY  06/03/2019   Dr Marlou Starks   ABDOMINAL SURGERY     32 months of age-- unsure of type of surgery   COLONOSCOPY  04/2016   hemorrhoids--normal per pt with Dr Collene Mares   DILATATION & CURETTAGE/HYSTEROSCOPY WITH MYOSURE N/A 02/08/2020   Procedure: DILATATION & CURETTAGE/HYSTEROSCOPY WITH MYOSURE;  Surgeon: Waymon Amato, MD;  Location: [facility];  Service: Gynecology;  Laterality: N/A;   HEMORRHOID SURGERY  2005/2006   INTRAUTERINE DEVICE (IUD) INSERTION N/A 02/08/2020   Procedure: INTRAUTERINE DEVICE (IUD) INSERTION UNDER ULTRASOUND GUIDANCE;  Surgeon: Waymon Amato, MD;  Location: [facility];  Service: Gynecology;  Laterality: N/A;  Mirena   LAPAROTOMY N/A 06/03/2019   Procedure: EXPLORATORY LAPAROTOMY WITH LYSIS OF ADHESIONS;  Surgeon: Jovita Kussmaul, MD;  Location: [facility];  Service: General;  Laterality: N/A;   SVT ABLATION N/A 10/03/2020   Procedure: SVT ABLATION;  Surgeon: Evans Lance, MD;  Location: [facility];  Service: Cardiovascular;  Laterality: N/A;  UTERINE FIBROID [facility]IZATION  2010   [facility]   Family History  Problem Relation Age of Onset   Cancer Mother        breast   Stroke Mother    Cancer Maternal Grandmother        breast   Hypertension Maternal Grandmother    Cancer Other         breast, lung   Hypertension Other    Stroke Other    Vision loss Father        some   Heart disease Father        Rhythm disturbance   Hypertension Sister    Hypertension Brother    Hypertension Brother    Diabetes Neg Hx    Outpatient Medications Prior to Visit  Medication Sig Dispense Refill   acetaminophen (TYLENOL) 325 MG tablet Take 2 tablets (650 mg total) by mouth every 4 (four) hours as needed for headache or mild pain.     amLODipine (NORVASC) 10 MG tablet Take 1 tablet (10 mg total) by mouth daily. 90 tablet 3   aspirin EC 81 MG tablet Take 1 tablet (81 mg total) by mouth daily.     atorvastatin (LIPITOR) 40 MG tablet Take 1 tablet (40 mg total) by mouth daily. 90 tablet 1   canagliflozin (INVOKANA) 300 MG TABS tablet Take 1 tablet (300 mg total) by mouth daily before breakfast. 90 tablet 1   Dulaglutide (TRULICITY) 3 GY/6.5LD SOPN Inject 3 mg into the skin once a week. Sunday     glucose blood (ACCU-CHEK GUIDE) test strip 1 each by Other route 3 (three) times daily. Use as instructed to check once daily. 100 each 12   levonorgestrel (MIRENA, 52 MG,) 20 MCG/DAY IUD Mirena 20 mcg/24 hours (7 yrs) 52 mg intrauterine device  Take by intrauterine route.     losartan (COZAAR) 100 MG tablet Take 1 tablet (100 mg total) by mouth daily. 90 tablet 3   metoprolol tartrate (LOPRESSOR) 50 MG tablet Take 1 tablet (50 mg total) by mouth 2 (two) times daily. 45 tablet 0   ONETOUCH DELICA LANCETS FINE MISC Use to check blood sugar 3 times daily 100 each 3   polyethylene glycol (MIRALAX / GLYCOLAX) 17 g packet Take 17 g by mouth once a week.     Probiotic Product (ULTRAFLORA IMMUNE HEALTH PO) Take 1 capsule by mouth daily.     spironolactone (ALDACTONE) 100 MG tablet Take 1 tablet (100 mg total) by mouth daily. 90 tablet 1   traMADol (ULTRAM) 50 MG tablet tramadol 50 mg tablet     No facility-administered medications prior to visit.   Allergies  Allergen Reactions   Lisinopril Cough      Objective:   Today's Vitals   11/28/20 1132  BP: 130/80  Pulse: (!) 3  Temp: 97.9 F (36.6 C)  TempSrc: Temporal  SpO2: 98%  Weight: 184 lb 12.8 oz (83.8 kg)  Height: 5\' 3"  (1.6 m)   Body mass index is 32.74 kg/m.   General: Well developed, well nourished. No acute distress. Abdomen: Soft, non-tender. Bowel sounds positive, normal pitch and frequency. No hepatosplenomegaly. No   rebound or guarding. Psych: Alert and oriented x3. Normal mood and affect.  Health Maintenance Due  Topic Date Due   PAP SMEAR-Modifier  03/25/2019   COVID-19 Vaccine (3 - Booster for Pfizer series) 04/03/2020     Assessment & Plan:   1. SBO (small bowel obstruction) [facility]) Reviewed hospital discharge, lab results,  and x-ray reports. Current episode of SBO appears resolved. Ms. Warbington appears to need further GI work-up, including possibly EGD/colonoscopy to rule out other potential causes for recurrent SBO. I will refer her to GI. I will plan to complete the FMLA paperwork.  - Basic metabolic panel - Magnesium - Ambulatory referral to Gastroenterology  Haydee Salter, MD

## 2020-12-01 ENCOUNTER — Telehealth: Payer: Self-pay

## 2020-12-01 NOTE — Telephone Encounter (Signed)
FMLA paper work faxed to [place] @ [phone].  Patient notified VIA phone and copy mailed to home address per patient request. Dm/cma

## 2020-12-02 ENCOUNTER — Telehealth: Payer: Self-pay | Admitting: Gastroenterology

## 2020-12-02 NOTE — Telephone Encounter (Signed)
Good afternoon Dr. Loletha Carrow, we received a referral for this patient.  They have been to other GI practice within the last year.  Will be sending records to you.  Can you please review and advise on scheduling?  Thank you.

## 2020-12-03 NOTE — Telephone Encounter (Signed)
Patient informed of Dr. Loletha Carrow recommendations.

## 2020-12-03 NOTE — Telephone Encounter (Signed)
Request received to transfer GI care from outside practice to [facility].  We appreciate the interest in our practice, however at this time due to high demand from patients without established GI providers we cannot accommodate this transfer.  Ability to accommodate future transfer requests may change over time and the patient can contact us again in 6-12 months if still interested in being seen at [facility].      I also recommend she follow up with [facility] because she was recently hospitalized for recurrent small bowel obstruction.  - H. Loletha Carrow, MD

## 2020-12-08 ENCOUNTER — Encounter: Payer: Self-pay | Admitting: Nurse Practitioner

## 2020-12-26 ENCOUNTER — Other Ambulatory Visit: Payer: Self-pay | Admitting: Family

## 2020-12-26 ENCOUNTER — Other Ambulatory Visit: Payer: Self-pay | Admitting: Endocrinology

## 2020-12-26 DIAGNOSIS — E1169 Type 2 diabetes mellitus with other specified complication: Secondary | ICD-10-CM

## 2021-01-05 ENCOUNTER — Encounter: Payer: Self-pay | Admitting: Nurse Practitioner

## 2021-01-05 DIAGNOSIS — Z803 Family history of malignant neoplasm of breast: Secondary | ICD-10-CM | POA: Insufficient documentation

## 2021-01-06 DIAGNOSIS — E2740 Unspecified adrenocortical insufficiency: Secondary | ICD-10-CM | POA: Insufficient documentation

## 2021-01-27 ENCOUNTER — Other Ambulatory Visit: Payer: 59

## 2021-01-30 ENCOUNTER — Ambulatory Visit: Payer: 59 | Admitting: Endocrinology

## 2021-02-21 ENCOUNTER — Other Ambulatory Visit: Payer: Self-pay | Admitting: Endocrinology

## 2021-02-21 DIAGNOSIS — E1169 Type 2 diabetes mellitus with other specified complication: Secondary | ICD-10-CM

## 2021-02-24 ENCOUNTER — Telehealth: Payer: Self-pay | Admitting: Endocrinology

## 2021-02-24 DIAGNOSIS — E1169 Type 2 diabetes mellitus with other specified complication: Secondary | ICD-10-CM

## 2021-02-24 MED ORDER — TRULICITY 3 MG/0.5ML ~~LOC~~ SOAJ: SUBCUTANEOUS | 0 refills | Status: DC

## 2021-02-24 NOTE — Telephone Encounter (Signed)
Pt is calling in for refill for Trulicity until appt in Dec. [facility] on [address]. Pt contact [phone]

## 2021-02-24 NOTE — Telephone Encounter (Signed)
Patient notified that script has been sent.

## 2021-03-10 ENCOUNTER — Encounter: Payer: Self-pay | Admitting: Family Medicine

## 2021-03-27 ENCOUNTER — Other Ambulatory Visit: Payer: Self-pay | Admitting: Endocrinology

## 2021-03-27 DIAGNOSIS — E1169 Type 2 diabetes mellitus with other specified complication: Secondary | ICD-10-CM

## 2021-04-14 ENCOUNTER — Other Ambulatory Visit (INDEPENDENT_AMBULATORY_CARE_PROVIDER_SITE_OTHER): Payer: 59

## 2021-04-14 ENCOUNTER — Other Ambulatory Visit: Payer: Self-pay

## 2021-04-14 DIAGNOSIS — E7800 Pure hypercholesterolemia, unspecified: Secondary | ICD-10-CM | POA: Diagnosis not present

## 2021-04-14 DIAGNOSIS — E669 Obesity, unspecified: Secondary | ICD-10-CM | POA: Diagnosis not present

## 2021-04-14 DIAGNOSIS — E1169 Type 2 diabetes mellitus with other specified complication: Secondary | ICD-10-CM | POA: Diagnosis not present

## 2021-04-14 LAB — LDL CHOLESTEROL, DIRECT: Direct LDL: 113 mg/dL

## 2021-04-15 ENCOUNTER — Other Ambulatory Visit: Payer: Self-pay | Admitting: Endocrinology

## 2021-04-15 DIAGNOSIS — E1169 Type 2 diabetes mellitus with other specified complication: Secondary | ICD-10-CM

## 2021-04-15 LAB — BASIC METABOLIC PANEL
BUN: 20 mg/dL (ref 6–23)
CO2: 29 mEq/L (ref 19–32)
Calcium: 9.4 mg/dL (ref 8.4–10.5)
Chloride: 102 mEq/L (ref 96–112)
Creatinine, Ser: 0.94 mg/dL (ref 0.40–1.20)
GFR: 69.96 mL/min (ref >60.00)
Glucose, Bld: 94 mg/dL (ref 70–99)
Potassium: 3.9 mEq/L (ref 3.5–5.1)
Sodium: 140 mEq/L (ref 135–145)

## 2021-04-15 LAB — HEMOGLOBIN A1C: Hgb A1c MFr Bld: 5.9 % (ref 4.6–6.5)

## 2021-04-17 ENCOUNTER — Ambulatory Visit (INDEPENDENT_AMBULATORY_CARE_PROVIDER_SITE_OTHER): Payer: 59 | Admitting: Endocrinology

## 2021-04-17 ENCOUNTER — Other Ambulatory Visit: Payer: Self-pay

## 2021-04-17 ENCOUNTER — Encounter: Payer: Self-pay | Admitting: Endocrinology

## 2021-04-17 VITALS — BP 132/86 | HR 87 | Ht 62.0 in | Wt 178.0 lb

## 2021-04-17 DIAGNOSIS — E7800 Pure hypercholesterolemia, unspecified: Secondary | ICD-10-CM

## 2021-04-17 DIAGNOSIS — E669 Obesity, unspecified: Secondary | ICD-10-CM | POA: Diagnosis not present

## 2021-04-17 DIAGNOSIS — E1169 Type 2 diabetes mellitus with other specified complication: Secondary | ICD-10-CM

## 2021-04-17 DIAGNOSIS — E042 Nontoxic multinodular goiter: Secondary | ICD-10-CM

## 2021-04-17 NOTE — Progress Notes (Signed)
Patient ID: Susan Whitaker, female   DOB: 08/05/1970, 50 y.o.   MRN: [id number]            Reason for Appointment: Follow-up for Type 2 Diabetes  Referring physician:  Debbrah Alar   History of Present Illness:          Date of diagnosis of type 2 diabetes mellitus: 2013        Background history:  She was diagnosed to have diabetes when she was having fibroid surgery. A1c in 2013 was 6.9 She was treated with metformin but she says she was not able to take this because of diarrhea and would be irregular with it However her blood sugars stayed about the same until 2015 when they were higher and glipizide added Her blood sugars had been much higher since about late 2015 with A1c in the 9-10 range with her taking glipizide alone  Recent history:   Non-insulin hypoglycemic drugs: Invokana 024 mg daily, Trulicity 3 mg weekly  Current management, levels of control, and problems identified:  Her A1c had gone up to 8.1 in November 2019 but has been below 6% subsequently  A1c is now 5.9   She has not checked her blood sugars at all at home  Fasting lab glucose 94  She has since her last visit lost about 16 pounds weight  This is mostly related to having issues with intestinal obstruction and changing her diet significantly, likely eating smaller portions also  Renal function stable with Invokana He has no nausea with 3 mg Trulicity  also taking Invokana without side effects She is generally walking about 20 minutes a day  Side effects from medications have been: Diarrhea from regular metformin   Glucose monitoring: Irregularly    Blood Glucose readings mostly 90-100 range, did not bring monitor for download   Self-care: The diet that the patient has been following is: tries to limit Portions .     Typical meal intake: Breakfast is  fruit, Activia Yogurt,  For snacks she will have Snack box, chicken breast at dinnertime.  No Sweet drinks                  Dietician  visit, most recent: 5/17, has  had diabetes education classes                Weight history:  Wt Readings from Last 3 Encounters:  04/17/21 178 lb (80.7 kg)  11/28/20 184 lb 12.8 oz (83.8 kg)  11/17/20 190 lb (86.2 kg)    Glycemic control:   Lab Results  Component Value Date   HGBA1C 5.9 04/15/2021   HGBA1C 5.5 09/23/2020   HGBA1C 6.4 06/25/2020   Lab Results  Component Value Date   MICROALBUR <0.7 06/25/2020   LDLCALC 107 (H) 06/25/2020   CREATININE 0.94 04/15/2021   Lab Results  Component Value Date   MICRALBCREAT 0.9 06/25/2020   Lab on 04/14/2021  Component Date Value Ref Range Status   Direct LDL 04/14/2021 113.0  mg/dL Final   Optimal:  <100 mg/dLNear or Above Optimal:  100-129 mg/dLBorderline High:  130-159 mg/dLHigh:  160-189 mg/dLVery High:  >190 mg/dL   Sodium 04/15/2021 140  135 - 145 mEq/L Final   Potassium 04/15/2021 3.9  3.5 - 5.1 mEq/L Final   Chloride 04/15/2021 102  96 - 112 mEq/L Final   CO2 04/15/2021 29  19 - 32 mEq/L Final   Glucose, Bld 04/15/2021 94  70 - 99 mg/dL Final  BUN 04/15/2021 20  6 - 23 mg/dL Final   Creatinine, Ser 04/15/2021 0.94  0.40 - 1.20 mg/dL Final   GFR 04/15/2021 69.96  >60.00 mL/min Final   Calculated using the CKD-EPI Creatinine Equation (2021)   Calcium 04/15/2021 9.4  8.4 - 10.5 mg/dL Final   Hgb A1c MFr Bld 04/15/2021 5.9  4.6 - 6.5 % Final   Glycemic Control Guidelines for People with Diabetes:Non Diabetic:  <6%Goal of Therapy: <7%Additional Action Suggested:  >8%      Other active problems are in review of systems    Allergies as of 04/17/2021       Reactions   Lisinopril Cough        Medication List        Accurate as of April 17, 2021  8:40 AM. If you have any questions, ask your nurse or doctor.          acetaminophen 325 MG tablet Commonly known as: TYLENOL Take 2 tablets (650 mg total) by mouth every 4 (four) hours as needed for headache or mild pain.   amLODipine 10 MG tablet Commonly  known as: NORVASC Take 1 tablet (10 mg total) by mouth daily.   aspirin EC 81 MG tablet Take 1 tablet (81 mg total) by mouth daily.   atorvastatin 40 MG tablet Commonly known as: LIPITOR Take 1 tablet by mouth once daily   canagliflozin 300 MG Tabs tablet Commonly known as: Invokana Take 1 tablet (300 mg total) by mouth daily before breakfast.   glucose blood test strip Commonly known as: Accu-Chek Guide 1 each by Other route 3 (three) times daily. Use as instructed to check once daily.   losartan 100 MG tablet Commonly known as: COZAAR Take 1 tablet (100 mg total) by mouth daily.   metoprolol tartrate 50 MG tablet Commonly known as: LOPRESSOR Take 1 tablet (50 mg total) by mouth 2 (two) times daily.   Mirena (52 MG) 20 MCG/DAY Iud Generic drug: levonorgestrel Mirena 20 mcg/24 hours (7 yrs) 52 mg intrauterine device  Take by intrauterine route.   OneTouch Delica Lancets Fine Misc Use to check blood sugar 3 times daily   polyethylene glycol 17 g packet Commonly known as: MIRALAX / GLYCOLAX Take 17 g by mouth once a week.   spironolactone 100 MG tablet Commonly known as: ALDACTONE Take 1 tablet (100 mg total) by mouth daily.   traMADol 50 MG tablet Commonly known as: ULTRAM tramadol 50 mg tablet   Trulicity 3 FG/1.8EX Sopn Generic drug: Dulaglutide INJECT 3MG  SUBCUTANEOUSLY ONCE A WEEK   ULTRAFLORA IMMUNE HEALTH PO Take 1 capsule by mouth daily.        Allergies:  Allergies  Allergen Reactions   Lisinopril Cough    Past Medical History:  Diagnosis Date   Abdominal pain 10/04/2020   Class 2 obesity due to excess calories with body mass index (BMI) of 35.0 to 35.9 in adult 06/2010   DM type 2 (diabetes mellitus, type 2) ([place])    Gastric ulcer    GERD (gastroesophageal reflux disease)    H/O constipation    H/O hemorrhoids    History of small bowel obstruction 06/02/2019   Hyperlipidemia    Hypertension    Hypokalemia 2019   Menorrhagia    Primary  hyperaldosteronism ([place]) 02/21/2012   S/P radiofrequency ablation operation for arrhythmia 10/03/20 10/04/2020   SVT (supraventricular tachycardia) ([place])    Noted 11/2011 admission   Thrombocytopenia [facility])     Past Surgical History:  Procedure Laterality Date   ABDOMINAL ADHESION SURGERY  06/03/2019   Dr Marlou Starks   ABDOMINAL SURGERY     38 months of age-- unsure of type of surgery   COLONOSCOPY  04/2016   hemorrhoids--normal per pt with Dr Collene Mares   DILATATION & CURETTAGE/HYSTEROSCOPY WITH MYOSURE N/A 02/08/2020   Procedure: DILATATION & CURETTAGE/HYSTEROSCOPY WITH MYOSURE;  Surgeon: Waymon Amato, MD;  Location: [facility];  Service: Gynecology;  Laterality: N/A;   HEMORRHOID SURGERY  2005/2006   INTRAUTERINE DEVICE (IUD) INSERTION N/A 02/08/2020   Procedure: INTRAUTERINE DEVICE (IUD) INSERTION UNDER ULTRASOUND GUIDANCE;  Surgeon: Waymon Amato, MD;  Location: [facility];  Service: Gynecology;  Laterality: N/A;  Mirena   LAPAROTOMY N/A 06/03/2019   Procedure: EXPLORATORY LAPAROTOMY WITH LYSIS OF ADHESIONS;  Surgeon: Jovita Kussmaul, MD;  [facility];  Service: General;  Laterality: N/A;   SVT ABLATION N/A 10/03/2020   Procedure: SVT ABLATION;  Surgeon: Evans Lance, MD;  Location: [facility];  Service: Cardiovascular;  Laterality: N/A;   UTERINE FIBROID EMBOLIZATION  2010   at [facility]    Family History  Problem Relation Age of Onset   Cancer Mother 51       breast   Stroke Mother    Vision loss Father        some   Heart disease Father        Rhythm disturbance   Hypertension Sister    Hypertension Brother    Hypertension Brother    Cancer Maternal Grandmother 98       breast   Hypertension Maternal Grandmother    Cancer Other        breast, lung   Hypertension Other    Stroke Other    Cancer Maternal Aunt 50       Breast   Diabetes Neg Hx     Social History:  reports that she has never smoked. She has never used smokeless tobacco. She  reports that she does not drink alcohol and does not use drugs.    Review of Systems    Lipid history: LDL 171 At baseline Lipitor 20 mg prescribed by PCP Her last LDL was above target  Despite reminders she does not take Lipitor regularly and has not taken it daily, last prescription was sent in August    Lab Results  Component Value Date   CHOL 164 06/25/2020   HDL 43.80 06/25/2020   LDLCALC 107 (H) 06/25/2020   LDLDIRECT 113.0 04/14/2021   TRIG 64.0 06/25/2020   CHOLHDL 4 06/25/2020           Hypertension:   She is on amlodipine, metoprolol and losartan 100 mg prescribed by her PCP  Because of hypokalemia she is also on 100 mg of Aldactone, last potassium normal However she needed potassium supplementation recently in the hospital and has been out of Aldactone for 2 weeks     BP Readings from Last 3 Encounters:  04/17/21 132/86  11/28/20 130/80  11/22/20 127/79    Lab Results  Component Value Date   CREATININE 0.94 04/15/2021   BUN 20 04/15/2021   NA 140 04/15/2021   K 3.9 04/15/2021   CL 102 04/15/2021   CO2 29 04/15/2021     History of goiter with multiple nodules, stable as of the last ultrasound in 2015, largest 1.4 cm nodule in the right side  Lab Results  Component Value Date   TSH 1.691 06/10/2020  Most recent foot exam: 03/2019 Eye exam normal in 02/2019   Review of Systems    Physical Examination:  BP 132/86   Pulse 87   Ht 5\' 2"  (1.575 m)   Wt 178 lb (80.7 kg)   SpO2 99%   BMI 32.56 kg/m   Her thyroid is palpable on the right side about twice normal, nodular and slightly firm, minimally enlarged on the left and relatively more smooth No lymphadenopathy in the neck  No ankle edema    ASSESSMENT/PLAN:  Diabetes type 2, non-insulin-dependent  See history of present illness for detailed discussion of current diabetes management, blood sugar patterns and problems identified  Her A1c is improved and below 6%  consistently  She is treated with Trulicity 3 mg weekly, Invokana 300 mg   Her blood sugars are very well controlled although not monitoring at all at home She has lost about 16 pounds in the last 6 months or so with improved diet Also trying to walk very regularly  She will stay on the same regimen and follow-up in 6 months Currently still not motivated to restart home monitoring  HYPERTENSION: Diastolic is high normal again She will make sure she takes all her medications including Aldactone regularly  No recent problems with hypokalemia     Lipids: LDL is still above target at 113 Will need for her to take atorvastatin consistently and reminded her to follow-up with her PCP  She has had her flu shot      There are no Patient Instructions on file for this visit.   Elayne Snare 04/17/2021, 8:40 AM   Note: This office note was prepared with Dragon voice recognition system technology. Any transcriptional errors that result from this process are unintentional.

## 2021-04-17 NOTE — Patient Instructions (Signed)
Take Lipitor daily 

## 2021-04-24 LAB — HM DIABETES EYE EXAM

## 2021-05-20 ENCOUNTER — Other Ambulatory Visit: Payer: Self-pay | Admitting: Family

## 2021-05-20 ENCOUNTER — Other Ambulatory Visit: Payer: Self-pay | Admitting: Endocrinology

## 2021-05-20 ENCOUNTER — Encounter: Payer: Self-pay | Admitting: Nurse Practitioner

## 2021-05-20 ENCOUNTER — Encounter: Payer: Self-pay | Admitting: Endocrinology

## 2021-05-20 ENCOUNTER — Other Ambulatory Visit: Payer: Self-pay

## 2021-05-20 DIAGNOSIS — E1169 Type 2 diabetes mellitus with other specified complication: Secondary | ICD-10-CM

## 2021-05-20 MED ORDER — TRULICITY 3 MG/0.5ML ~~LOC~~ SOAJ: SUBCUTANEOUS | 0 refills | Status: DC

## 2021-05-21 MED ORDER — CANAGLIFLOZIN 300 MG PO TABS: 300.0000 mg | ORAL_TABLET | Freq: Every day | ORAL | 1 refills | Status: DC

## 2021-05-21 MED ORDER — TRULICITY 1.5 MG/0.5ML ~~LOC~~ SOAJ: 1.5000 mg | SUBCUTANEOUS | 0 refills | Status: DC

## 2021-05-21 MED ORDER — ATORVASTATIN CALCIUM 40 MG PO TABS: 40.0000 mg | ORAL_TABLET | Freq: Every day | ORAL | 0 refills | Status: DC

## 2021-05-26 ENCOUNTER — Ambulatory Visit: Payer: 59 | Admitting: Nurse Practitioner

## 2021-05-26 ENCOUNTER — Encounter: Payer: Self-pay | Admitting: Nurse Practitioner

## 2021-05-26 ENCOUNTER — Other Ambulatory Visit: Payer: Self-pay

## 2021-05-26 VITALS — BP 130/74 | HR 86 | Temp 97.5°F | Ht 63.0 in | Wt 180.2 lb

## 2021-05-26 DIAGNOSIS — K56609 Unspecified intestinal obstruction, unspecified as to partial versus complete obstruction: Secondary | ICD-10-CM

## 2021-05-26 DIAGNOSIS — K5904 Chronic idiopathic constipation: Secondary | ICD-10-CM | POA: Diagnosis not present

## 2021-05-26 DIAGNOSIS — G8929 Other chronic pain: Secondary | ICD-10-CM

## 2021-05-26 DIAGNOSIS — R1084 Generalized abdominal pain: Secondary | ICD-10-CM

## 2021-05-26 MED ORDER — METOCLOPRAMIDE HCL 5 MG PO TABS: 5.0000 mg | ORAL_TABLET | Freq: Three times a day (TID) | ORAL | 2 refills | Status: DC

## 2021-05-26 MED ORDER — ONDANSETRON HCL 4 MG PO TABS: 4.0000 mg | ORAL_TABLET | Freq: Three times a day (TID) | ORAL | 0 refills | Status: DC | PRN

## 2021-05-26 NOTE — Assessment & Plan Note (Addendum)
BM every 2 days with use of miralax and colace daily Last colonoscopy 04/2016 by Dr. Collene Mares: colon polyps removed, pathology report indicates hyperplastic polyp. Repeat in 5-52yrs. She is not pleased with her Dr. Lorie Apley care. She is requesting referral to [facility]. Referral entered. Needs repeat endoscopy

## 2021-05-26 NOTE — Progress Notes (Signed)
Subjective:  Patient ID: Susan Whitaker, female    DOB: 05/13/1971  Age: 52 y.o. MRN: [id number]  CC: Follow-up (Pt would like to discuss medications and get refills. Pt states she is in need of medication to help with her stomach pain and cramps. )  HPI Chronic idiopathic constipation BM every 2 days with use of miralax and colace daily Last colonoscopy 04/2016 by Dr. Collene Mares: colon polyps removed, pathology report indicates hyperplastic polyp. Repeat in 5-42yrs. She is not pleased with her Dr. Lorie Apley care. She is requesting referral to [facility]. Referral entered  Chronic abdominal pain Note from 09/2020 office visit: "Hx of recurrent volvulus,. this is the 2nd episode. 1st occurrence at age 56months which led to GI surery (unable to get pediatric record from [place]). 2nd incidence 2021. All symptoms spontaneously resolved per patient. today she reports BM daily with use of probiotics, No ABD pain, no N/V. She has appt with Gen, surgery Dr. Marlou Starks: 10/2020"  Last hospital admission 11/2020 for partial SBO. Had another incident 09/2020. She had and appt with [facility]. Surgeon on 05/22/2021: surgical intervention was not recommended.  Today she Reports daily ABD pain and nausea, waxing and waning, ranges from 3-10. Use of tylenol 650mg  and low residue diet to manage symptoms. She has minimal relief. Hx of chronic constipation(managed with miralax and colace) She reports anxiety over eating outside of her home due to fear of another volvulus incidence.She is requesting referral to pain clinic to discuss pain management with an opioid.  Wt Readings from Last 3 Encounters:  05/26/21 180 lb 3.2 oz (81.7 kg)  04/17/21 178 lb (80.7 kg)  11/28/20 184 lb 12.8 oz (83.8 kg)   Advised against use of opioids due to risk of worsening constipation. Recommended use of reglan and zofran, and GI f/up. She agreed to above recommended, but also wants referral to pain clinic.  Reviewed past Medical, Social and  Family history today.  Outpatient Medications Prior to Visit  Medication Sig Dispense Refill   acetaminophen (TYLENOL) 325 MG tablet Take 2 tablets (650 mg total) by mouth every 4 (four) hours as needed for headache or mild pain.     amLODipine (NORVASC) 10 MG tablet Take 1 tablet (10 mg total) by mouth daily. 90 tablet 3   atorvastatin (LIPITOR) 40 MG tablet Take 1 tablet (40 mg total) by mouth daily. 90 tablet 0   canagliflozin (INVOKANA) 300 MG TABS tablet Take 1 tablet (300 mg total) by mouth daily before breakfast. 90 tablet 1   Dulaglutide (TRULICITY) 1.5 GG/2.6RS SOPN Inject 1.5 mg into the skin once a week. 2 mL 0   Dulaglutide (TRULICITY) 3 WN/4.6EV SOPN INJECT 3MG  SUBCUTANEOUSLY ONCE A WEEK Strength: 3 MG/0.5ML 4 mL 0   glucose blood (ACCU-CHEK GUIDE) test strip 1 each by Other route 3 (three) times daily. Use as instructed to check once daily. 100 each 12   levonorgestrel (MIRENA, 52 MG,) 20 MCG/DAY IUD Mirena 20 mcg/24 hours (7 yrs) 52 mg intrauterine device  Take by intrauterine route.     losartan (COZAAR) 100 MG tablet Take 1 tablet (100 mg total) by mouth daily. 90 tablet 3   ONETOUCH DELICA LANCETS FINE MISC Use to check blood sugar 3 times daily 100 each 3   polyethylene glycol (MIRALAX / GLYCOLAX) 17 g packet Take 17 g by mouth once a week.     Probiotic Product (ULTRAFLORA IMMUNE HEALTH PO) Take 1 capsule by mouth daily.     spironolactone (  ALDACTONE) 100 MG tablet Take 1 tablet by mouth once daily 30 tablet 0   traMADol (ULTRAM) 50 MG tablet tramadol 50 mg tablet     metoprolol tartrate (LOPRESSOR) 50 MG tablet Take 1 tablet (50 mg total) by mouth 2 (two) times daily. (Patient not taking: Reported on 04/17/2021) 45 tablet 0   aspirin EC 81 MG tablet Take 1 tablet (81 mg total) by mouth daily. (Patient not taking: Reported on 04/17/2021)     No facility-administered medications prior to visit.    ROS See HPI  Objective:  BP 130/74 (BP Location: Left Arm, Patient  Position: Sitting, Cuff Size: Large)    Pulse 86    Temp (!) 97.5 F (36.4 C) (Temporal)    Ht 5\' 3"  (1.6 m)    Wt 180 lb 3.2 oz (81.7 kg)    SpO2 98%    BMI 31.92 kg/m   Physical Exam Cardiovascular:     Rate and Rhythm: Normal rate.     Pulses: Normal pulses.  Pulmonary:     Effort: Pulmonary effort is normal.  Abdominal:     General: Bowel sounds are normal. There is no distension.     Palpations: Abdomen is soft. There is no mass.     Tenderness: There is abdominal tenderness. There is no guarding.     Hernia: No hernia is present.  Neurological:     Mental Status: She is alert and oriented to person, place, and time.   Assessment & Plan:  This visit occurred during the SARS-CoV-2 public health emergency.  Safety protocols were in place, including screening questions prior to the visit, additional usage of staff PPE, and extensive cleaning of exam room while observing appropriate contact time as indicated for disinfecting solutions.   Zafira was seen today for follow-up.  Diagnoses and all orders for this visit:  Chronic idiopathic constipation -     Ambulatory referral to Gastroenterology  Chronic generalized abdominal pain -     Ambulatory referral to Pain Clinic -     Ambulatory referral to Gastroenterology -     metoCLOPramide (REGLAN) 5 MG tablet; Take 1 tablet (5 mg total) by mouth 4 (four) times daily -  before meals and at bedtime. -     ondansetron (ZOFRAN) 4 MG tablet; Take 1 tablet (4 mg total) by mouth every 8 (eight) hours as needed for nausea or vomiting.  SBO (small bowel obstruction) (HCC)    Problem List Items Addressed This Visit       Digestive   Chronic idiopathic constipation - Primary    BM every 2 days with use of miralax and colace daily Last colonoscopy 04/2016 by Dr. Collene Mares: colon polyps removed, pathology report indicates hyperplastic polyp. Repeat in 5-40yrs. She is not pleased with her Dr. Lorie Apley care. She is requesting referral to  [facility]. Referral entered      Relevant Orders   Ambulatory referral to Gastroenterology   SBO (small bowel obstruction) ([place])     Other   Chronic abdominal pain    Note from 09/2020 office visit: "Hx of recurrent volvulus,. this is the 2nd episode. 1st occurrence at age 83months which led to GI surery (unable to get pediatric record from [place]). 2nd incidence 2021. All symptoms spontaneously resolved per patient. today she reports BM daily with use of probiotics, No ABD pain, no N/V. She has appt with Gen, surgery Dr. Marlou Starks: 10/2020"  Last hospital admission 11/2020 for partial SBO. Had another incident 09/2020. She had  and appt with [place]. Surgeon on 05/22/2021: surgical intervention was not recommended.  Today she Reports daily ABD pain and nausea, waxing and waning, ranges from 3-10. Use of tylenol 650mg  and low residue diet to manage symptoms. She has minimal relief. Hx of chronic constipation(managed with miralax and colace) She reports anxiety over eating outside of her home due to fear of another volvulus incidence.She is requesting referral to pain clinic to discuss pain management with an opioid.  Wt Readings from Last 3 Encounters:  05/26/21 180 lb 3.2 oz (81.7 kg)  04/17/21 178 lb (80.7 kg)  11/28/20 184 lb 12.8 oz (83.8 kg)   Advised against use of opioids due to risk of worsening constipation. Recommended use of reglan and zofran, and GI f/up. She agreed to above recommended, but also wants referral to pain clinic.      Relevant Medications   metoCLOPramide (REGLAN) 5 MG tablet   ondansetron (ZOFRAN) 4 MG tablet    Follow-up: Return in about 4 weeks (around 06/23/2021) for chronic abddominal pain.  Wilfred Lacy, NP

## 2021-05-26 NOTE — Patient Instructions (Signed)
You will be contacted to schedule appt with GI and pain clinic Start reglan and zofran as prescribed.

## 2021-05-26 NOTE — Assessment & Plan Note (Signed)
" >>  ASSESSMENT AND PLAN FOR CHRONIC IDIOPATHIC CONSTIPATION WRITTEN ON 05/26/2021  1:07 PM BY Yolandra Habig LUM, NP  BM every 2 days with use of miralax  and colace daily Last colonoscopy 04/2016 by Dr. Kristie: colon polyps removed, pathology report indicates hyperplastic polyp. Repeat in 5-104yrs. She is not pleased with her Dr. Nola care. She is requesting referral to [facility]. Referral entered. Needs repeat endoscopy "

## 2021-05-26 NOTE — Assessment & Plan Note (Addendum)
Note from 09/2020 office visit: "Hx of recurrent volvulus,. this is the 2nd episode. 1st occurrence at age 87months which led to GI surery (unable to get pediatric record from [place]). 2nd incidence 2021. All symptoms spontaneously resolved per patient. today she reports BM daily with use of probiotics, No ABD pain, no N/V. She has appt with Gen, surgery Dr. Marlou Starks: 10/2020"  Also has hx of recurrent SBO. Last hospital admission 11/2020 for partial SBO. Had another incident 09/2020. She had an appt with [place]. Surgeon on 05/22/2021: surgical intervention was not recommended.  IBS vs Gastroparesis? Today she reports daily ABD pain, bloating and nausea, waxing and waning, ranges from 3-10. Use of tylenol 650mg  and low residue diet to manage symptoms. She has minimal relief. Hx of chronic constipation(managed with miralax and colace) She reports anxiety over eating outside of her home due to fear of another volvulus incidence.She is requesting referral to pain clinic to discuss pain management with an opioid.  Wt Readings from Last 3 Encounters:  05/26/21 180 lb 3.2 oz (81.7 kg)  04/17/21 178 lb (80.7 kg)  11/28/20 184 lb 12.8 oz (83.8 kg)   Advised against use of opioids due to risk of worsening constipation. Recommended use of reglan and zofran, and GI f/up. She agreed to above recommended, but also wants referral to pain clinic.

## 2021-06-08 ENCOUNTER — Other Ambulatory Visit: Payer: Self-pay | Admitting: Internal Medicine

## 2021-06-08 DIAGNOSIS — I471 Supraventricular tachycardia: Secondary | ICD-10-CM

## 2021-06-26 ENCOUNTER — Encounter: Payer: 59 | Admitting: Nurse Practitioner

## 2021-07-06 ENCOUNTER — Other Ambulatory Visit: Payer: Self-pay | Admitting: Endocrinology

## 2021-07-06 ENCOUNTER — Other Ambulatory Visit: Payer: Self-pay | Admitting: Internal Medicine

## 2021-07-06 ENCOUNTER — Other Ambulatory Visit: Payer: Self-pay | Admitting: Nurse Practitioner

## 2021-07-06 DIAGNOSIS — E669 Obesity, unspecified: Secondary | ICD-10-CM

## 2021-07-06 DIAGNOSIS — E1169 Type 2 diabetes mellitus with other specified complication: Secondary | ICD-10-CM

## 2021-07-06 DIAGNOSIS — I10 Essential (primary) hypertension: Secondary | ICD-10-CM

## 2021-07-07 NOTE — Telephone Encounter (Signed)
Pt states she realized she has not took medication x 3-4 months and states she needs a refill because she is out of medication. Okay to refill?

## 2021-07-08 ENCOUNTER — Ambulatory Visit: Payer: 59 | Admitting: Nurse Practitioner

## 2021-07-08 ENCOUNTER — Encounter: Payer: Self-pay | Admitting: Nurse Practitioner

## 2021-07-08 ENCOUNTER — Other Ambulatory Visit: Payer: Self-pay

## 2021-07-08 VITALS — BP 124/86 | HR 90 | Temp 97.1°F | Ht 63.0 in | Wt 182.8 lb

## 2021-07-08 DIAGNOSIS — G8929 Other chronic pain: Secondary | ICD-10-CM

## 2021-07-08 DIAGNOSIS — E118 Type 2 diabetes mellitus with unspecified complications: Secondary | ICD-10-CM | POA: Diagnosis not present

## 2021-07-08 DIAGNOSIS — R109 Unspecified abdominal pain: Secondary | ICD-10-CM | POA: Diagnosis not present

## 2021-07-08 DIAGNOSIS — R350 Frequency of micturition: Secondary | ICD-10-CM

## 2021-07-08 LAB — POCT URINALYSIS DIPSTICK
Bilirubin, UA: NEGATIVE
Glucose, UA: POSITIVE — AB
Ketones, UA: NEGATIVE
Leukocytes, UA: NEGATIVE
Nitrite, UA: NEGATIVE
Protein, UA: POSITIVE — AB
Spec Grav, UA: 1.015 (ref 1.010–1.025)
Urobilinogen, UA: 0.2 E.U./dL
pH, UA: 7 (ref 5.0–8.0)

## 2021-07-08 LAB — MICROALBUMIN / CREATININE URINE RATIO
Creatinine,U: 46.3 mg/dL
Microalb Creat Ratio: 2.8 mg/g (ref 0.0–30.0)
Microalb, Ur: 1.3 mg/dL (ref 0.0–1.9)

## 2021-07-08 NOTE — Assessment & Plan Note (Addendum)
Stable pain a 2-3/10 Current use of oxycodone/acetaminophen 5/325mg  in PM prn prescribed by [facility] clinic. also takes reglan in AM prn. Denies any adverse side effects.  Maintain current medications and GI f/up

## 2021-07-08 NOTE — Progress Notes (Signed)
Subjective:  Patient ID: Susan Whitaker, female    DOB: 20-Feb-1971  Age: 51 y.o. MRN: [id number]  CC: Follow-up (F/u for chronic stomach pain. /Pt states she has been with out DM medication (Trulicity) x 2 weeks due to shortage and would like to know what she should do. )   HPI Needs repeat urinalysis to re eval for UTI. She completed oral abx 1week ago.  Chronic abdominal pain Stable pain a 2-3/10 Current use of oxycodone/acetaminophen 5/325mg  in PM prn prescribed by [facility] clinic. also takes reglan in AM prn. Denies any adverse side effects.  Maintain current medications and GI f/up   Type 2 diabetes mellitus ([place]) Under the care of Dr. Dwyane Dee Repeat urine microalbumin Normal foot exam Advised to discussed trulicity change with Dr. Dwyane Dee   Reviewed past Medical, Social and Family history today.  Outpatient Medications Prior to Visit  Medication Sig Dispense Refill   acetaminophen (TYLENOL) 325 MG tablet Take 2 tablets (650 mg total) by mouth every 4 (four) hours as needed for headache or mild pain.     amLODipine (NORVASC) 10 MG tablet Take 1 tablet (10 mg total) by mouth daily. 90 tablet 3   atorvastatin (LIPITOR) 40 MG tablet Take 1 tablet (40 mg total) by mouth daily. 90 tablet 0   canagliflozin (INVOKANA) 300 MG TABS tablet Take 1 tablet (300 mg total) by mouth daily before breakfast. 90 tablet 1   Dulaglutide (TRULICITY) 3 NK/5.3ZJ SOPN INJECT 3MG  SUBCUTANEOUSLY ONCE A WEEK 4 mL 0   glucose blood (ACCU-CHEK GUIDE) test strip 1 each by Other route 3 (three) times daily. Use as instructed to check once daily. 100 each 12   levonorgestrel (MIRENA, 52 MG,) 20 MCG/DAY IUD Mirena 20 mcg/24 hours (7 yrs) 52 mg intrauterine device  Take by intrauterine route.     losartan (COZAAR) 100 MG tablet Take 1 tablet (100 mg total) by mouth daily. Maintain appt for additional refills 90 tablet 0   metoprolol tartrate (LOPRESSOR) 50 MG tablet Take 1 tablet (50 mg total) by  mouth 2 (two) times daily. 45 tablet 0   ondansetron (ZOFRAN) 4 MG tablet Take 1 tablet (4 mg total) by mouth every 8 (eight) hours as needed for nausea or vomiting. 30 tablet 0   ONETOUCH DELICA LANCETS FINE MISC Use to check blood sugar 3 times daily 100 each 3   oxyCODONE-acetaminophen (PERCOCET/ROXICET) 5-325 MG tablet Take 1 tablet by mouth every 6 (six) hours as needed.     polyethylene glycol (MIRALAX / GLYCOLAX) 17 g packet Take 17 g by mouth once a week.     Probiotic Product (ULTRAFLORA IMMUNE HEALTH PO) Take 1 capsule by mouth daily.     spironolactone (ALDACTONE) 100 MG tablet Take 1 tablet by mouth once daily 30 tablet 0   metoCLOPramide (REGLAN) 5 MG tablet Take 1 tablet (5 mg total) by mouth 4 (four) times daily -  before meals and at bedtime. 120 tablet 2   traMADol (ULTRAM) 50 MG tablet tramadol 50 mg tablet     Dulaglutide (TRULICITY) 1.5 QB/3.4LP SOPN Inject 1.5 mg into the skin once a week. (Patient not taking: Reported on 07/08/2021) 2 mL 0   No facility-administered medications prior to visit.    ROS See HPI  Objective:  BP 124/86 (BP Location: Left Arm, Patient Position: Sitting, Cuff Size: Large)    Pulse 90    Temp (!) 97.1 F (36.2 C) (Temporal)    Ht 5\' 3"  (  1.6 m)    Wt 182 lb 12.8 oz (82.9 kg)    SpO2 99%    BMI 32.38 kg/m   Physical Exam Cardiovascular:     Rate and Rhythm: Normal rate.     Pulses: Normal pulses.  Pulmonary:     Effort: Pulmonary effort is normal.  Abdominal:     General: There is no distension.     Palpations: Abdomen is soft.     Tenderness: There is generalized abdominal tenderness. There is no guarding.  Neurological:     Mental Status: She is alert.    Assessment & Plan:  This visit occurred during the SARS-CoV-2 public health emergency.  Safety protocols were in place, including screening questions prior to the visit, additional usage of staff PPE, and extensive cleaning of exam room while observing appropriate contact time as  indicated for disinfecting solutions.   Skylee was seen today for follow-up.  Diagnoses and all orders for this visit:  Urinary frequency -     POCT urinalysis dipstick -     Urine Culture  Chronic abdominal pain  Diabetes mellitus type 2 with complications (HCC) -     Microalbumin / creatinine urine ratio   Problem List Items Addressed This Visit       Endocrine   Type 2 diabetes mellitus ([place])    Under the care of Dr. Dwyane Dee Repeat urine microalbumin Normal foot exam Advised to discussed trulicity change with Dr. Dwyane Dee        Other   Chronic abdominal pain    Stable pain a 2-3/10 Current use of oxycodone/acetaminophen 5/325mg  in PM prn prescribed by [facility] clinic. also takes reglan in AM prn. Denies any adverse side effects.  Maintain current medications and GI f/up       Relevant Medications   oxyCODONE-acetaminophen (PERCOCET/ROXICET) 5-325 MG tablet   Other Visit Diagnoses     Urinary frequency    -  Primary   Relevant Orders   POCT urinalysis dipstick (Completed)   Urine Culture      Follow-up: Return in about 4 weeks (around 08/05/2021) for CPE (fasting).  Wilfred Lacy, NP

## 2021-07-08 NOTE — Assessment & Plan Note (Signed)
Under the care of Dr. Dwyane Dee Repeat urine microalbumin Normal foot exam Advised to discussed trulicity change with Dr. Dwyane Dee

## 2021-07-08 NOTE — Patient Instructions (Addendum)
Maintain current medications. Please ask Dr. Dwyane Dee about switching trulicity due to current drug shortage.  Normal urinalysis. Sent for urine culture to ensure no bacteria growth.

## 2021-07-09 LAB — URINE CULTURE
MICRO NUMBER:: 13042693
SPECIMEN QUALITY:: ADEQUATE

## 2021-07-20 ENCOUNTER — Encounter: Payer: Self-pay | Admitting: Endocrinology

## 2021-07-20 DIAGNOSIS — E1169 Type 2 diabetes mellitus with other specified complication: Secondary | ICD-10-CM

## 2021-07-21 ENCOUNTER — Ambulatory Visit (INDEPENDENT_AMBULATORY_CARE_PROVIDER_SITE_OTHER): Payer: 59 | Admitting: Nurse Practitioner

## 2021-07-21 ENCOUNTER — Other Ambulatory Visit: Payer: Self-pay

## 2021-07-21 ENCOUNTER — Encounter: Payer: Self-pay | Admitting: Nurse Practitioner

## 2021-07-21 VITALS — BP 132/80 | HR 80 | Temp 98.0°F | Ht 62.25 in | Wt 182.4 lb

## 2021-07-21 DIAGNOSIS — E785 Hyperlipidemia, unspecified: Secondary | ICD-10-CM

## 2021-07-21 DIAGNOSIS — Z0001 Encounter for general adult medical examination with abnormal findings: Secondary | ICD-10-CM

## 2021-07-21 LAB — CBC
HCT: 38.6 % (ref 36.0–46.0)
Hemoglobin: 13 g/dL (ref 12.0–15.0)
MCHC: 33.6 g/dL (ref 30.0–36.0)
MCV: 88.8 fl (ref 78.0–100.0)
Platelets: 425 10*3/uL — ABNORMAL HIGH (ref 150.0–400.0)
RBC: 4.35 Mil/uL (ref 3.87–5.11)
RDW: 13.1 % (ref 11.5–15.5)
WBC: 7.5 10*3/uL (ref 4.0–10.5)

## 2021-07-21 LAB — TSH: TSH: 1.11 u[IU]/mL (ref 0.35–5.50)

## 2021-07-21 LAB — COMPREHENSIVE METABOLIC PANEL
ALT: 21 U/L (ref 0–35)
AST: 17 U/L (ref 0–37)
Albumin: 4.1 g/dL (ref 3.5–5.2)
Alkaline Phosphatase: 69 U/L (ref 39–117)
BUN: 26 mg/dL — ABNORMAL HIGH (ref 6–23)
CO2: 30 mEq/L (ref 19–32)
Calcium: 9.5 mg/dL (ref 8.4–10.5)
Chloride: 102 mEq/L (ref 96–112)
Creatinine, Ser: 0.77 mg/dL (ref 0.40–1.20)
GFR: 88.72 mL/min (ref >60.00)
Glucose, Bld: 98 mg/dL (ref 70–99)
Potassium: 3.5 mEq/L (ref 3.5–5.1)
Sodium: 140 mEq/L (ref 135–145)
Total Bilirubin: 0.4 mg/dL (ref 0.2–1.2)
Total Protein: 7.6 g/dL (ref 6.0–8.3)

## 2021-07-21 LAB — LIPID PANEL
Cholesterol: 179 mg/dL (ref 0–200)
HDL: 62.7 mg/dL (ref >39.00)
LDL Cholesterol: 107 mg/dL — ABNORMAL HIGH (ref 0–99)
NonHDL: 116.47
Total CHOL/HDL Ratio: 3
Triglycerides: 49 mg/dL (ref 0.0–149.0)
VLDL: 9.8 mg/dL (ref 0.0–40.0)

## 2021-07-21 MED ORDER — TRULICITY 1.5 MG/0.5ML ~~LOC~~ SOAJ: 1.5000 mg | SUBCUTANEOUS | 0 refills | Status: DC

## 2021-07-21 NOTE — Progress Notes (Signed)
Subjective:    Patient ID: Susan Whitaker, female    DOB: July 06, 1970, 51 y.o.   MRN: [id number]  Patient presents today for CPE  HPI  Vision:up to date Dental:up to date Exercise:walking  daily, 69mns Weight:  Wt Readings from Last 3 Encounters:  07/21/21 182 lb 6.4 oz (82.7 kg)  07/08/21 182 lb 12.8 oz (82.9 kg)  05/26/21 180 lb 3.2 oz (81.7 kg)    Sexual History: [place] Mammogram: up to date Cervical cancer: up to date Colon Cancer Screen: up to date  [facility]Su[facility]: [facility] 07/21/2021 05/26/2021 04/28/2020 04/11/2019 07/06/2017 01/10/2017 09/16/2015  Decreased Interest 0 0 0 0 0 0 0  Down, Depressed, Hopeless 1 0 0 0 0 0 0  PHQ - 2 Score 1 0 0 0 0 0 0  Altered sleeping 0 0 - - - 0 -  Tired, decreased energy 0 1 - - - 1 -  Change in appetite 0 3 - - - 1 -  Feeling bad or failure about yourself  0 0 - - - 0 -  Trouble concentrating 0 0 - - - 0 -  Moving slowly or fidgety/restless 0 0 - - - 0 -  Suicidal thoughts 0 0 - - - 0 -  PHQ-9 Score 1 4 - - - 2 -  Difficult doing work/chores Not difficult at all Not difficult at all - - - - -  Some recent data might be hidden   Immunizations: (TDAP, Hep C screen, Pneumovax, Influenza, zoster)  Health Maintenance  Topic Date Due   COVID-19 Vaccine (4 - Booster for Pfizer series) 02/22/2021   Mammogram  03/27/2021   Hemoglobin A1C  10/13/2021   Pap Smear  03/17/2022   Eye exam for diabetics  04/24/2022   Complete foot exam   07/08/2022   Tetanus Vaccine  07/14/2022   Colon Cancer Screening  05/18/2027   Flu Shot  Completed   Hepatitis C Screening: USPSTF Recommendation to screen - Ages 18-53 yo.  Completed   HIV Screening  Completed   Zoster (Shingles) Vaccine  Completed   HPV Vaccine  Aged Out   Fall Risk: Fall Risk  07/21/2021 07/06/2017 09/16/2015 09/10/2015 09/04/2015  Falls in the past year? 0 Yes No No No  Number falls in past yr: 0 1 -  - -  Injury with Fall? 0 No - - -  Risk for fall due to : No Fall Risks - - - -  Follow up Falls evaluation completed - - - -   Medications and allergies reviewed with patient and updated if appropriate.  Patient Active Problem List   Diagnosis Date Noted   Hypoaldosteronism ([place] 01/06/2021   FH: breast cancer in first degree relative 01/05/2021   Diabetes mellitus type 2 in obese ([place] 11/17/2020   Chronic abdominal pain 10/04/2020   S/P radiofrequency ablation operation for arrhythmia 10/03/20 10/04/2020   Volvulus ([place] 09/15/2020   Chronic idiopathic constipation 09/14/2020   Abdominal bloating 09/14/2020   Hemorrhage of rectum and anus 09/14/2020   Obesity (BMI 30-39.9) 09/14/2020   Fibroid, uterine 09/14/2020   Chronic narcotic use 09/14/2020   IUD (intrauterine device) in place 09/14/2020   Abnormal uterine bleeding due to adenomyosis 01/08/2020   Polyp of corpus uteri 01/08/2020   SBO (small bowel obstruction) ([place] 06/02/2019   History of small bowel obstruction 06/02/2019   Vitamin D deficiency 05/26/2019  Anxiety state 07/14/2012   Primary hyperaldosteronism ([place]) 02/21/2012   Microalbuminuria 02/15/2012   Multiple thyroid nodules 01/04/2012   SVT (supraventricular tachycardia) ([place]) 12/29/2011   Hypokalemia 11/28/2011   Cervical pain (neck) 05/28/2011   Hemorrhoids 02/07/2009   Type 2 diabetes mellitus ([place]) 02/07/2009   ALLERGIC RHINITIS 05/24/2008   Dyslipidemia 04/22/2008   THROMBOCYTOSIS 04/22/2008   URINARY INCONTINENCE 04/22/2008   HTN (hypertension) 01/04/2007   GERD 01/04/2007    Current Outpatient Medications on File Prior to Visit  Medication Sig Dispense Refill   acetaminophen (TYLENOL) 325 MG tablet Take 2 tablets (650 mg total) by mouth every 4 (four) hours as needed for headache or mild pain.     amLODipine (NORVASC) 10 MG tablet Take 1 tablet (10 mg total) by mouth daily. 90 tablet 3   atorvastatin (LIPITOR) 40 MG tablet Take 1 tablet (40 mg  total) by mouth daily. 90 tablet 0   canagliflozin (INVOKANA) 300 MG TABS tablet Take 1 tablet (300 mg total) by mouth daily before breakfast. 90 tablet 1   Dulaglutide (TRULICITY) 3 DE/0.8XK SOPN INJECT '3MG'$  SUBCUTANEOUSLY ONCE A WEEK 4 mL 0   glucose blood (ACCU-CHEK GUIDE) test strip 1 each by Other route 3 (three) times daily. Use as instructed to check once daily. 100 each 12   levonorgestrel (MIRENA, 52 MG,) 20 MCG/DAY IUD Mirena 20 mcg/24 hours (7 yrs) 52 mg intrauterine device  Take by intrauterine route.     losartan (COZAAR) 100 MG tablet Take 1 tablet (100 mg total) by mouth daily. Maintain appt for additional refills 90 tablet 0   metoprolol tartrate (LOPRESSOR) 50 MG tablet Take 1 tablet (50 mg total) by mouth 2 (two) times daily. 45 tablet 0   ondansetron (ZOFRAN) 4 MG tablet Take 1 tablet (4 mg total) by mouth every 8 (eight) hours as needed for nausea or vomiting. 30 tablet 0   ONETOUCH DELICA LANCETS FINE MISC Use to check blood sugar 3 times daily 100 each 3   oxyCODONE-acetaminophen (PERCOCET/ROXICET) 5-325 MG tablet Take 1 tablet by mouth every 6 (six) hours as needed.     polyethylene glycol (MIRALAX / GLYCOLAX) 17 g packet Take 17 g by mouth once a week.     Probiotic Product (ULTRAFLORA IMMUNE HEALTH PO) Take 1 capsule by mouth daily.     spironolactone (ALDACTONE) 100 MG tablet Take 1 tablet by mouth once daily 30 tablet 0   Dulaglutide (TRULICITY) 1.5 GY/1.8HU SOPN Inject 1.5 mg into the skin once a week. 2 mL 0   No current facility-administered medications on file prior to visit.    Past Medical History:  Diagnosis Date   Abdominal pain 10/04/2020   Class 2 obesity due to excess calories with body mass index (BMI) of 35.0 to 35.9 in adult 06/2010   DM type 2 (diabetes mellitus, type 2) ([place])    Gastric ulcer    GERD (gastroesophageal reflux disease)    H/O constipation    H/O hemorrhoids    History of small bowel obstruction 06/02/2019   Hyperlipidemia     Hypertension    Hypokalemia 2019   Menorrhagia    Primary hyperaldosteronism ([place]) 02/21/2012   S/P radiofrequency ablation operation for arrhythmia 10/03/20 10/04/2020   SVT (supraventricular tachycardia) ([place])    Noted 11/2011 admission   Thrombocytopenia [facility])     Past Surgical History:  Procedure Laterality Date   ABDOMINAL ADHESION SURGERY  06/03/2019   Dr Marlou Starks   ABDOMINAL SURGERY  77 months of age-- unsure of type of surgery   COLONOSCOPY  04/2016   hemorrhoids--normal per pt with Dr Collene Mares   DILATATION & CURETTAGE/HYSTEROSCOPY WITH MYOSURE N/A 02/08/2020   Procedure: DILATATION & CURETTAGE/HYSTEROSCOPY WITH MYOSURE;  Surgeon: Waymon Amato, MD;  Location: [place];  Service: Gynecology;  Laterality: N/A;   HEMORRHOID SURGERY  2005/2006   INTRAUTERINE DEVICE (IUD) INSERTION N/A 02/08/2020   Procedure: INTRAUTERINE DEVICE (IUD) INSERTION UNDER ULTRASOUND GUIDANCE;  Surgeon: Waymon Amato, MD;  Location: [place];  Service: Gynecology;  Laterality: N/A;  Mirena   LAPAROTOMY N/A 06/03/2019   Procedure: EXPLORATORY LAPAROTOMY WITH LYSIS OF ADHESIONS;  Surgeon: Jovita Kussmaul, MD;  Location: [facility];  Service: General;  Laterality: N/A;   SVT ABLATION N/A 10/03/2020   Procedure: SVT ABLATION;  Surgeon: Evans Lance, MD;  Location: [place] [facility];  Service: Cardiovascular;  Laterality: N/A;   UTERINE FIBROID EMBOLIZATION  2010   at [facility]    Social History   Socioeconomic History   Marital status: Single    Spouse name: Not on file   Number of children: 2   Years of education: Not on file   Highest education level: Not on file  Occupational History   Occupation: YOUTH EMPLOYMENT    Employer: [employer]  Tobacco Use   Smoking status: Never   Smokeless tobacco: Never  Vaping Use   Vaping Use: Never used  Substance and Sexual Activity   Alcohol use: No   Drug use: No   Sexual activity: Not on file  Other Topics Concern   Not on file  Social  History Narrative   Works at [employer]         Social Determinants of Radio broadcast assistant Strain: Not on file  Food Insecurity: Not on file  Transportation Needs: Not on file  Physical Activity: Not on file  Stress: Not on file  Social Connections: Not on file    Family History  Problem Relation Age of Onset   Cancer Mother 2       breast   Stroke Mother    Vision loss Father        some   Heart disease Father        Rhythm disturbance   Hypertension Sister    Hypertension Brother    Hypertension Brother    Cancer Maternal Grandmother 98       breast   Hypertension Maternal Grandmother    Cancer Other        breast, lung   Hypertension Other    Stroke Other    Cancer Maternal Aunt 50       Breast   Diabetes Neg Hx         Review of Systems  Constitutional:  Negative for fever, malaise/fatigue and weight loss.  HENT:  Negative for congestion and sore throat.   Eyes:        Negative for visual changes  Respiratory:  Negative for cough and shortness of breath.   Cardiovascular:  Negative for chest pain, palpitations and leg swelling.  Gastrointestinal:  Negative for blood in stool, constipation, diarrhea and heartburn.  Genitourinary:  Negative for dysuria, frequency and urgency.  Musculoskeletal:  Negative for falls, joint pain and myalgias.  Skin:  Negative for rash.  Neurological:  Negative for dizziness, sensory change and headaches.  Endo/Heme/Allergies:  Does not bruise/bleed easily.  Psychiatric/Behavioral:  Negative for depression, hallucinations, substance abuse and  suicidal ideas. The patient is not nervous/anxious and does not have insomnia.    Objective:   Vitals:   07/21/21 1331 07/21/21 1351  BP: 140/86 132/80  Pulse: 80   Temp: 98 F (36.7 C)   SpO2: 99%     Body mass index is 33.09 kg/m.  Physical Examination:  Physical Exam Vitals reviewed.  Constitutional:      General: She is not in acute distress.     Appearance: She is well-developed.  HENT:     Right Ear: Tympanic membrane, ear canal and external ear normal.     Left Ear: Tympanic membrane, ear canal and external ear normal.  Eyes:     Extraocular Movements: Extraocular movements intact.     Conjunctiva/sclera: Conjunctivae normal.  Neck:     Thyroid: No thyroid mass, thyromegaly or thyroid tenderness.  Cardiovascular:     Rate and Rhythm: Normal rate and regular rhythm.     Pulses: Normal pulses.     Heart sounds: Normal heart sounds.  Pulmonary:     Effort: Pulmonary effort is normal. No respiratory distress.     Breath sounds: Normal breath sounds.  Chest:     Chest wall: No tenderness.  Abdominal:     General: Bowel sounds are normal.     Palpations: Abdomen is soft.  Musculoskeletal:        General: Normal range of motion.     Cervical back: Normal range of motion and neck supple.     Right lower leg: No edema.     Left lower leg: No edema.  Lymphadenopathy:     Cervical: No cervical adenopathy.  Skin:    General: Skin is warm and dry.  Neurological:     Mental Status: She is alert and oriented to person, place, and time.     Deep Tendon Reflexes: Reflexes are normal and symmetric.  Psychiatric:        Mood and Affect: Mood normal.        Behavior: Behavior normal.        Thought Content: Thought content normal.    ASSESSMENT and PLAN: This visit occurred during the SARS-CoV-2 public health emergency.  Safety protocols were in place, including screening questions prior to the visit, additional usage of staff PPE, and extensive cleaning of exam room while observing appropriate contact time as indicated for disinfecting solutions.   Briell was seen today for annual exam.  Diagnoses and all orders for this visit:  Encounter for preventative adult health care exam with abnormal findings -     Comprehensive metabolic panel -     CBC  Dyslipidemia -     Lipid panel -     TSH      Problem List Items Addressed  This Visit       Other   Dyslipidemia   Relevant Orders   Lipid panel   TSH   Other Visit Diagnoses     Encounter for preventative adult health care exam with abnormal findings    -  Primary   Relevant Orders   Comprehensive metabolic panel   CBC       Follow up: Return in about 6 months (around 01/21/2022) for HTN and , hyperlipidemia (fasting).  Wilfred Lacy, NP

## 2021-07-21 NOTE — Patient Instructions (Addendum)
Sign medical release to get records from GYN (PAP and mammogram) ? ?Maintain regular exercise (167mins per week) ? ?Go to lab for blood draw. ? ?Preventive Care 34-53 Years Old, Female ?Preventive care refers to lifestyle choices and visits with your health care provider that can promote health and wellness. Preventive care visits are also called wellness exams. ?What can I expect for my preventive care visit? ?Counseling ?Your health care provider may ask you questions about your: ?Medical history, including: ?Past medical problems. ?Family medical history. ?Pregnancy history. ?Current health, including: ?Menstrual cycle. ?Method of birth control. ?Emotional well-being. ?Home life and relationship well-being. ?Sexual activity and sexual health. ?Lifestyle, including: ?Alcohol, nicotine or tobacco, and drug use. ?Access to firearms. ?Diet, exercise, and sleep habits. ?Work and work Statistician. ?Sunscreen use. ?Safety issues such as seatbelt and bike helmet use. ?Physical exam ?Your health care provider will check your: ?Height and weight. These may be used to calculate your BMI (body mass index). BMI is a measurement that tells if you are at a healthy weight. ?Waist circumference. This measures the distance around your waistline. This measurement also tells if you are at a healthy weight and may help predict your risk of certain diseases, such as type 2 diabetes and high blood pressure. ?Heart rate and blood pressure. ?Body temperature. ?Skin for abnormal spots. ?What immunizations do I need? ?Vaccines are usually given at various ages, according to a schedule. Your health care provider will recommend vaccines for you based on your age, medical history, and lifestyle or other factors, such as travel or where you work. ?What tests do I need? ?Screening ?Your health care provider may recommend screening tests for certain conditions. This may include: ?Lipid and cholesterol levels. ?Diabetes screening. This is done by  checking your blood sugar (glucose) after you have not eaten for a while (fasting). ?Pelvic exam and Pap test. ?Hepatitis B test. ?Hepatitis C test. ?HIV (human immunodeficiency virus) test. ?STI (sexually transmitted infection) testing, if you are at risk. ?Lung cancer screening. ?Colorectal cancer screening. ?Mammogram. Talk with your health care provider about when you should start having regular mammograms. This may depend on whether you have a family history of breast cancer. ?BRCA-related cancer screening. This may be done if you have a family history of breast, ovarian, tubal, or peritoneal cancers. ?Bone density scan. This is done to screen for osteoporosis. ?Talk with your health care provider about your test results, treatment options, and if necessary, the need for more tests. ?Follow these instructions at home: ?Eating and drinking ? ?Eat a diet that includes fresh fruits and vegetables, whole grains, lean protein, and low-fat dairy products. ?Take vitamin and mineral supplements as recommended by your health care provider. ?Do not drink alcohol if: ?Your health care provider tells you not to drink. ?You are pregnant, may be pregnant, or are planning to become pregnant. ?If you drink alcohol: ?Limit how much you have to 0-1 drink a day. ?Know how much alcohol is in your drink. In the [place] one drink equals one 12 oz bottle of beer (355 mL), one 5 oz glass of wine (148 mL), or one 1? oz glass of hard liquor (44 mL). ?Lifestyle ?Brush your teeth every morning and night with fluoride toothpaste. Floss one time each day. ?Exercise for at least 30 minutes 5 or more days each week. ?Do not use any products that contain nicotine or tobacco. These products include cigarettes, chewing tobacco, and vaping devices, such as e-cigarettes. If you need help quitting, ask  your health care provider. ?Do not use drugs. ?If you are sexually active, practice safe sex. Use a condom or other form of protection to prevent  STIs. ?If you do not wish to become pregnant, use a form of birth control. If you plan to become pregnant, see your health care provider for a prepregnancy visit. ?Take aspirin only as told by your health care provider. Make sure that you understand how much to take and what form to take. Work with your health care provider to find out whether it is safe and beneficial for you to take aspirin daily. ?Find healthy ways to manage stress, such as: ?Meditation, yoga, or listening to music. ?Journaling. ?Talking to a trusted person. ?Spending time with friends and family. ?Minimize exposure to UV radiation to reduce your risk of skin cancer. ?Safety ?Always wear your seat belt while driving or riding in a vehicle. ?Do not drive: ?If you have been drinking alcohol. Do not ride with someone who has been drinking. ?When you are tired or distracted. ?While texting. ?If you have been using any mind-altering substances or drugs. ?Wear a helmet and other protective equipment during sports activities. ?If you have firearms in your house, make sure you follow all gun safety procedures. ?Seek help if you have been physically or sexually abused. ?What's next? ?Visit your health care provider once a year for an annual wellness visit. ?Ask your health care provider how often you should have your eyes and teeth checked. ?Stay up to date on all vaccines. ?This information is not intended to replace advice given to you by your health care provider. Make sure you discuss any questions you have with your health care provider. ?Document Revised: 10/29/2020 Document Reviewed: 10/29/2020 ?Elsevier Patient Education ? [place]. ? ?

## 2021-07-30 IMAGING — CT CT ABD-PELV W/ CM
2 of 5 series · 16 of 46 positions shown, 18 images · IV contrast (OMNIPAQUE)
Comparison: 11/17/2020

CLINICAL DATA: Abdominal pain, recent admission for small bowel
obstruction

EXAM:
CT ABDOMEN AND PELVIS WITH CONTRAST
TECHNIQUE: Multidetector CT imaging of the abdomen and pelvis was performed
using the standard protocol following bolus administration of
intravenous contrast.
CONTRAST:  100mL OMNIPAQUE IOHEXOL 300 MG/ML  SOLN

[Series 2: axial st · axial · 0.84mm/px · z∈[-585,-160]mm · 13 of 97 slices shown, 15 images]
[im 6/97  soft-tissue]
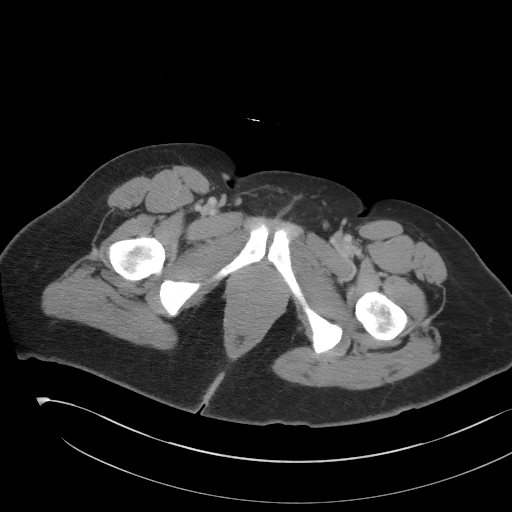
[im 6/97  bone]
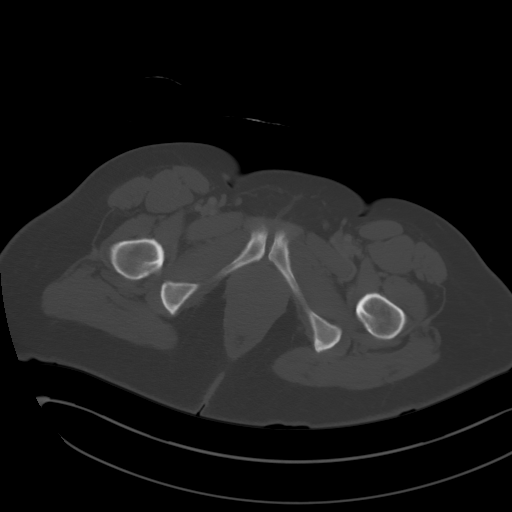
[im 12/97  soft-tissue]
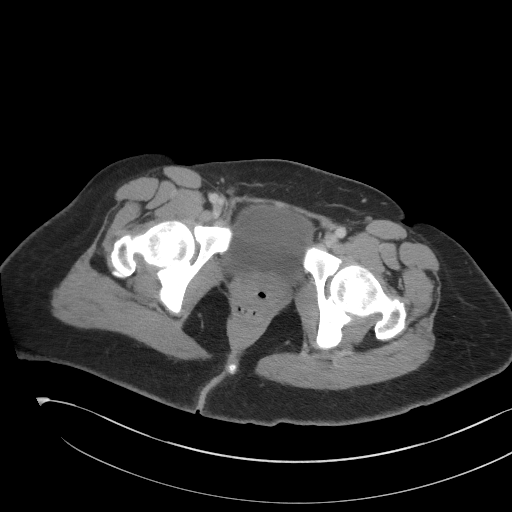
[im 23/97  soft-tissue]
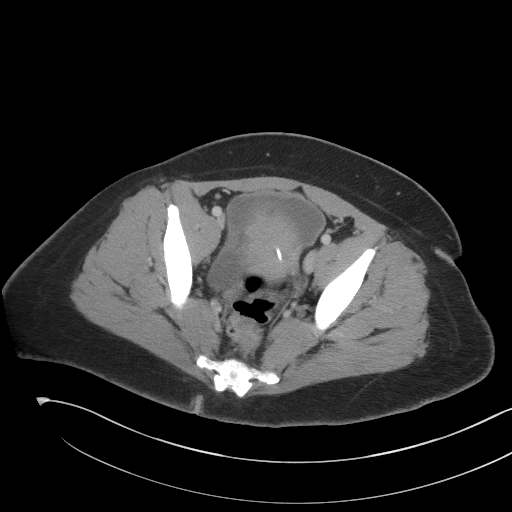
[im 29/97  soft-tissue]
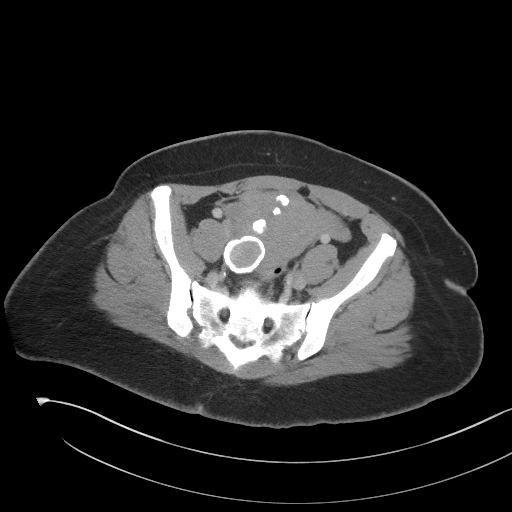
[im 34/97  soft-tissue]
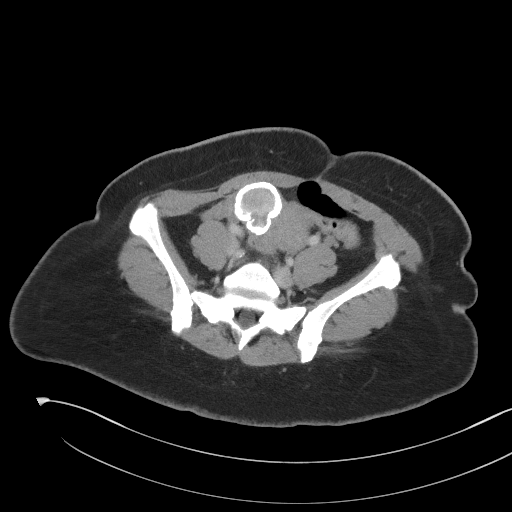
[im 40/97  soft-tissue]
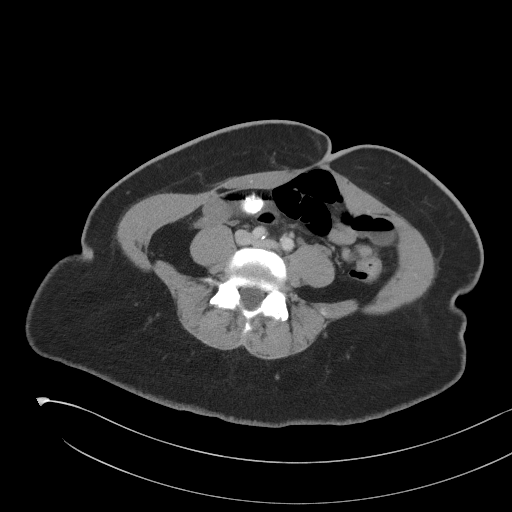
[im 51/97  soft-tissue]
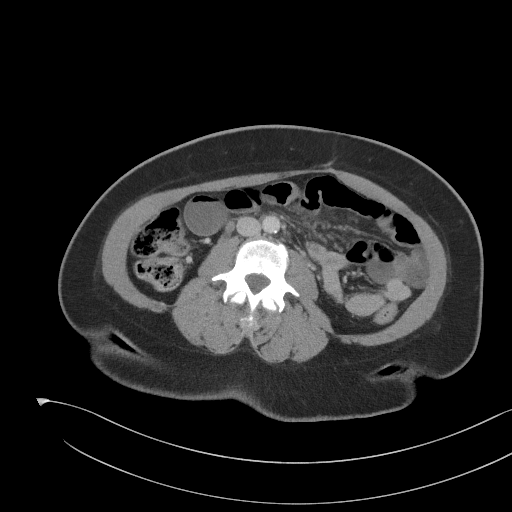
[im 57/97  soft-tissue]
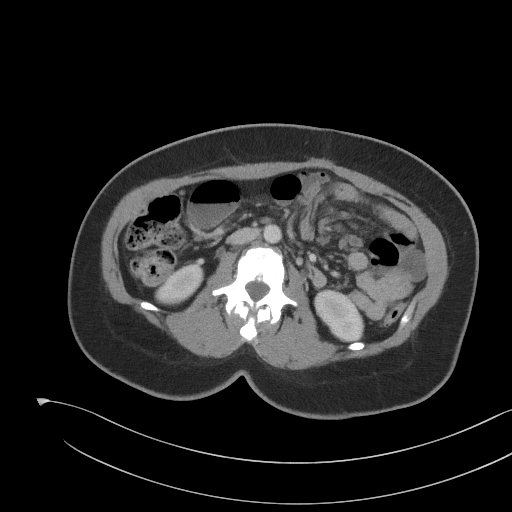
[im 63/97  soft-tissue]
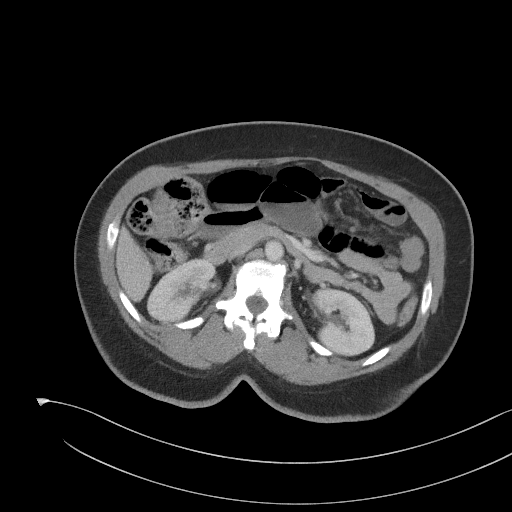
[im 63/97  bone]
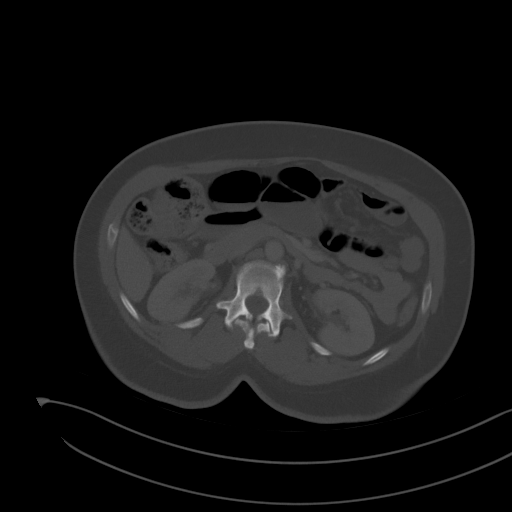
[im 68/97  soft-tissue]
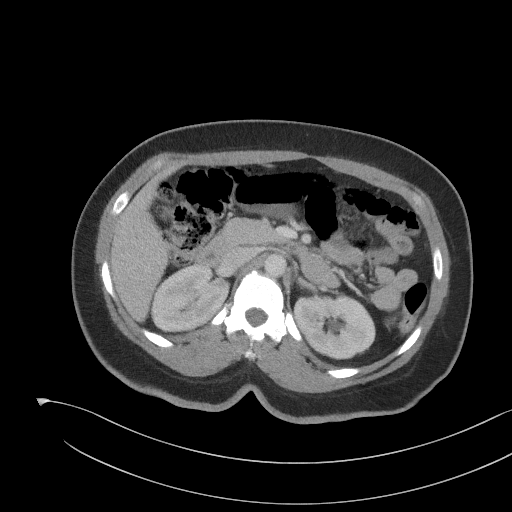
[im 74/97  soft-tissue]
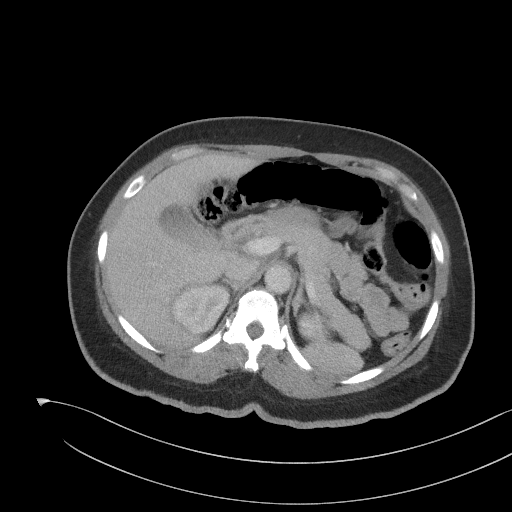
[im 85/97  soft-tissue]
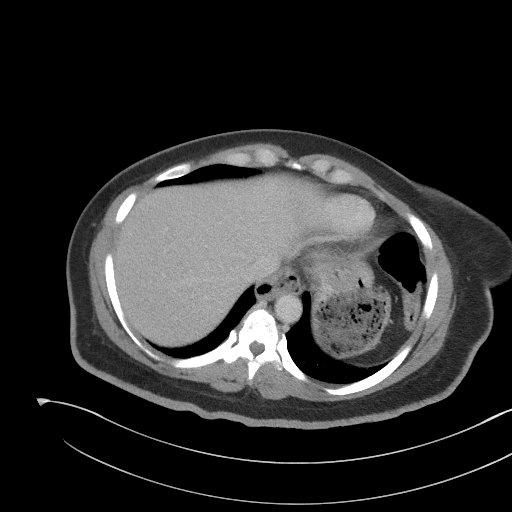
[im 91/97  soft-tissue]
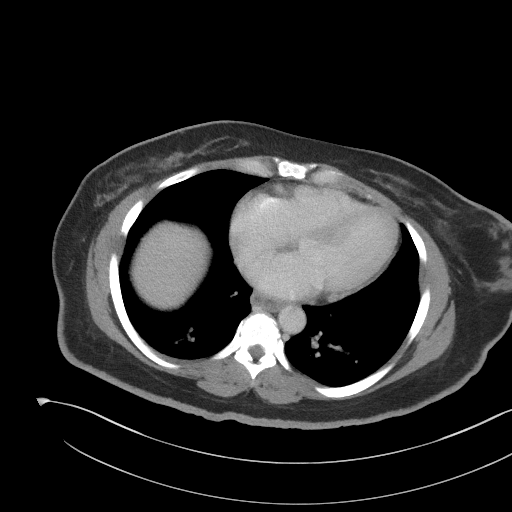

[Series 5: coronal st · coronal · 0.68mm/px · 3 of 83 slices shown]
[im 28/83  soft-tissue]
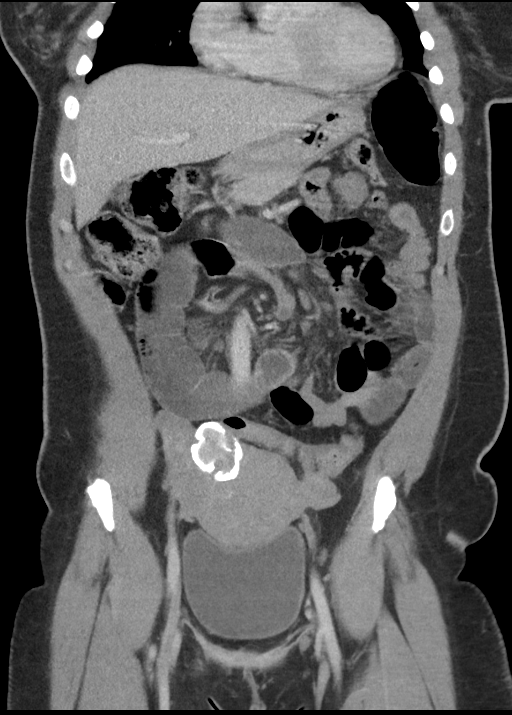
[im 37/83  soft-tissue]
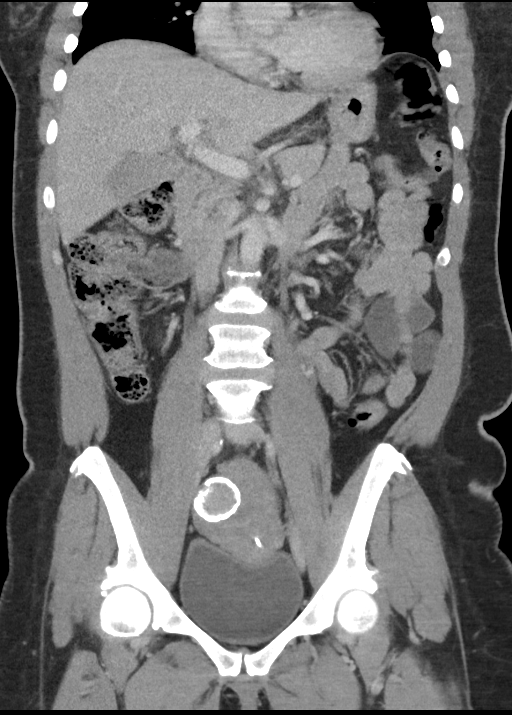
[im 46/83  soft-tissue]
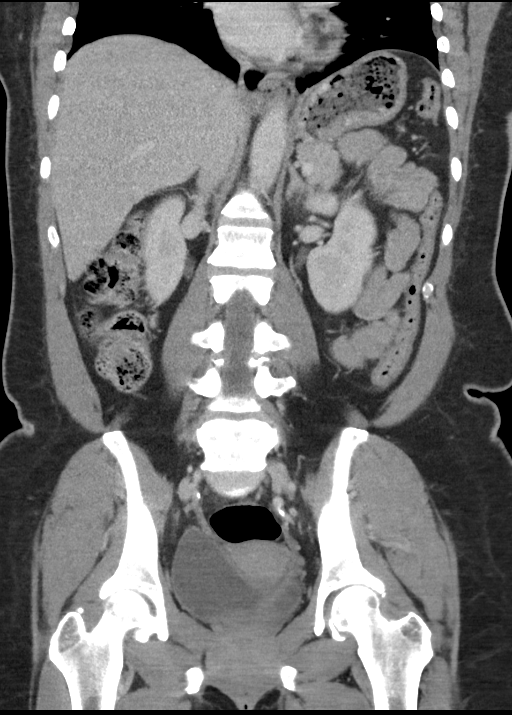

[16 of 46 positions shown; findings below may reference images not displayed]

FINDINGS: Lower chest: Lung bases are clear.

Hepatobiliary: Liver is within normal limits.

Gallbladder sludge (series 2/image 24). No intrahepatic or
extrahepatic ductal dilatation.

Pancreas: Within normal limits.

Spleen: Within normal limits.

Adrenals/Urinary Tract: Adrenal glands are within normal limits.

Kidneys are within normal limits.  No hydronephrosis.

Bladder is within normal limits.

Stomach/Bowel: Stomach is notable for a tiny hiatal hernia.

Again noted is a dilated loop of small bowel in the right mid
abdomen (series 2/image 33), with transition in the left mid abdomen
(series 2/image 38), suspicious for partial small bowel obstruction
on the basis of adhesions. No small bowel wall thickening or
pneumatosis. No free air.

Colon is not decompressed.

Vascular/Lymphatic: No evidence of abdominal aortic aneurysm.

Atherosclerotic calcifications of the abdominal aorta and branch
vessels.

No suspicious abdominopelvic lymphadenopathy.

Reproductive: Calcified uterine fibroids. IUD in the lower uterine
body.

No adnexal masses.

Other: No abdominopelvic ascites.

Musculoskeletal: Visualized osseous structures are within normal
limits.
IMPRESSION: Dilated loop small bowel in the right mid abdomen with transition,
suggesting partial small bowel obstruction, likely on the basis of
adhesions.

No small bowel wall thickening or pneumatosis.  No free air.

## 2021-08-12 ENCOUNTER — Encounter: Payer: 59 | Admitting: Nurse Practitioner

## 2021-10-09 ENCOUNTER — Other Ambulatory Visit: Payer: 59

## 2021-10-13 ENCOUNTER — Other Ambulatory Visit: Payer: 59

## 2021-10-14 ENCOUNTER — Other Ambulatory Visit (INDEPENDENT_AMBULATORY_CARE_PROVIDER_SITE_OTHER): Payer: 59

## 2021-10-14 DIAGNOSIS — E1169 Type 2 diabetes mellitus with other specified complication: Secondary | ICD-10-CM | POA: Diagnosis not present

## 2021-10-14 DIAGNOSIS — E119 Type 2 diabetes mellitus without complications: Secondary | ICD-10-CM

## 2021-10-14 DIAGNOSIS — E669 Obesity, unspecified: Secondary | ICD-10-CM | POA: Diagnosis not present

## 2021-10-14 DIAGNOSIS — E042 Nontoxic multinodular goiter: Secondary | ICD-10-CM

## 2021-10-14 DIAGNOSIS — E7800 Pure hypercholesterolemia, unspecified: Secondary | ICD-10-CM

## 2021-10-14 LAB — MICROALBUMIN / CREATININE URINE RATIO
Creatinine,U: 139.6 mg/dL
Microalb Creat Ratio: 0.9 mg/g (ref 0.0–30.0)
Microalb, Ur: 1.2 mg/dL (ref 0.0–1.9)

## 2021-10-14 LAB — COMPREHENSIVE METABOLIC PANEL
ALT: 31 U/L (ref 0–35)
AST: 21 U/L (ref 0–37)
Albumin: 4.3 g/dL (ref 3.5–5.2)
Alkaline Phosphatase: 69 U/L (ref 39–117)
BUN: 28 mg/dL — ABNORMAL HIGH (ref 6–23)
CO2: 29 mEq/L (ref 19–32)
Calcium: 9.7 mg/dL (ref 8.4–10.5)
Chloride: 95 mEq/L — ABNORMAL LOW (ref 96–112)
Creatinine, Ser: 0.94 mg/dL (ref 0.40–1.20)
GFR: 69.72 mL/min (ref >60.00)
Glucose, Bld: 87 mg/dL (ref 70–99)
Potassium: 4 mEq/L (ref 3.5–5.1)
Sodium: 130 mEq/L — ABNORMAL LOW (ref 135–145)
Total Bilirubin: 0.5 mg/dL (ref 0.2–1.2)
Total Protein: 8.1 g/dL (ref 6.0–8.3)

## 2021-10-14 LAB — HEMOGLOBIN A1C: Hgb A1c MFr Bld: 5.5 % (ref 4.6–6.5)

## 2021-10-14 LAB — LIPID PANEL
Cholesterol: 208 mg/dL — ABNORMAL HIGH (ref 0–200)
HDL: 62.1 mg/dL (ref >39.00)
LDL Cholesterol: 134 mg/dL — ABNORMAL HIGH (ref 0–99)
NonHDL: 145.56
Total CHOL/HDL Ratio: 3
Triglycerides: 60 mg/dL (ref 0.0–149.0)
VLDL: 12 mg/dL (ref 0.0–40.0)

## 2021-10-14 LAB — TSH: TSH: 1.04 u[IU]/mL (ref 0.35–5.50)

## 2021-10-15 NOTE — Progress Notes (Incomplete)
Patient ID: Susan Whitaker, female   DOB: March 27, 1971, 51 y.o.   MRN: [id number]            Reason for Appointment: Follow-up for Type 2 Diabetes  Referring physician:  Debbrah Alar   History of Present Illness:          Date of diagnosis of type 2 diabetes mellitus: 2013        Background history:  She was diagnosed to have diabetes when she was having fibroid surgery. A1c in 2013 was 6.9 She was treated with metformin but she says she was not able to take this because of diarrhea and would be irregular with it However her blood sugars stayed about the same until 2015 when they were higher and glipizide added Her blood sugars had been much higher since about late 2015 with A1c in the 9-10 range with her taking glipizide alone  Recent history:   Non-insulin hypoglycemic drugs: Invokana 539 mg daily, Trulicity 3 mg weekly  Current management, levels of control, and problems identified:  Her A1c had gone up to 8.1 in November 2019 but has been below 6% subsequently  A1c is now 5.9   She has not checked her blood sugars at all at home  Fasting lab glucose 94  She has since her last visit lost about 16 pounds weight  This is mostly related to having issues with intestinal obstruction and changing her diet significantly, likely eating smaller portions also  Renal function stable with Invokana He has no nausea with 3 mg Trulicity  also taking Invokana without side effects She is generally walking about 20 minutes a day  Side effects from medications have been: Diarrhea from regular metformin   Glucose monitoring: Irregularly    Blood Glucose readings mostly 90-100 range, did not bring monitor for download   Self-care: The diet that the patient has been following is: tries to limit Portions .     Typical meal intake: Breakfast is  fruit, Activia Yogurt,  For snacks she will have Snack box, chicken breast at dinnertime.  No Sweet drinks                  Dietician  visit, most recent: 5/17, has  had diabetes education classes                Weight history:  Wt Readings from Last 3 Encounters:  07/21/21 182 lb 6.4 oz (82.7 kg)  07/08/21 182 lb 12.8 oz (82.9 kg)  05/26/21 180 lb 3.2 oz (81.7 kg)    Glycemic control:   Lab Results  Component Value Date   HGBA1C 5.5 10/14/2021   HGBA1C 5.9 04/15/2021   HGBA1C 5.5 09/23/2020   Lab Results  Component Value Date   MICROALBUR 1.2 10/14/2021   LDLCALC 134 (H) 10/14/2021   CREATININE 0.94 10/14/2021   Lab Results  Component Value Date   MICRALBCREAT 0.9 10/14/2021   Lab on 10/14/2021  Component Date Value Ref Range Status  . Microalb, Ur 10/14/2021 1.2  0.0 - 1.9 mg/dL Final  . Creatinine,U 10/14/2021 139.6  mg/dL Final  . Microalb Creat Ratio 10/14/2021 0.9  0.0 - 30.0 mg/g Final  . TSH 10/14/2021 1.04  0.35 - 5.50 uIU/mL Final  . Cholesterol 10/14/2021 208 (H)  0 - 200 mg/dL Final   ATP III Classification       Desirable:  < 200 mg/dL  Borderline High:  200 - 239 mg/dL          High:  > = 240 mg/dL  . Triglycerides 10/14/2021 60.0  0.0 - 149.0 mg/dL Final   Normal:  <150 mg/dLBorderline High:  150 - 199 mg/dL  . HDL 10/14/2021 62.10  >39.00 mg/dL Final  . VLDL 10/14/2021 12.0  0.0 - 40.0 mg/dL Final  . LDL Cholesterol 10/14/2021 134 (H)  0 - 99 mg/dL Final  . Total CHOL/HDL Ratio 10/14/2021 3   Final                  Men          Women1/2 Average Risk     3.4          3.3Average Risk          5.0          4.42X Average Risk          9.6          7.13X Average Risk          15.0          11.0                      . NonHDL 10/14/2021 145.56   Final   NOTE:  Non-HDL goal should be 30 mg/dL higher than patient's LDL goal (i.e. LDL goal of < 70 mg/dL, would have non-HDL goal of < 100 mg/dL)  . Sodium 10/14/2021 130 (L)  135 - 145 mEq/L Final  . Potassium 10/14/2021 4.0  3.5 - 5.1 mEq/L Final  . Chloride 10/14/2021 95 (L)  96 - 112 mEq/L Final  . CO2 10/14/2021 29  19 - 32 mEq/L  Final  . Glucose, Bld 10/14/2021 87  70 - 99 mg/dL Final  . BUN 10/14/2021 28 (H)  6 - 23 mg/dL Final  . Creatinine, Ser 10/14/2021 0.94  0.40 - 1.20 mg/dL Final  . Total Bilirubin 10/14/2021 0.5  0.2 - 1.2 mg/dL Final  . Alkaline Phosphatase 10/14/2021 69  39 - 117 U/L Final  . AST 10/14/2021 21  0 - 37 U/L Final  . ALT 10/14/2021 31  0 - 35 U/L Final  . Total Protein 10/14/2021 8.1  6.0 - 8.3 g/dL Final  . Albumin 10/14/2021 4.3  3.5 - 5.2 g/dL Final  . GFR 10/14/2021 69.72  >60.00 mL/min Final   Calculated using the CKD-EPI Creatinine Equation (2021)  . Calcium 10/14/2021 9.7  8.4 - 10.5 mg/dL Final  . Hgb A1c MFr Bld 10/14/2021 5.5  4.6 - 6.5 % Final   Glycemic Control Guidelines for People with Diabetes:Non Diabetic:  <6%Goal of Therapy: <7%Additional Action Suggested:  >8%      Other active problems are in review of systems    Allergies as of 10/16/2021       Reactions   Lisinopril Cough        Medication List        Accurate as of October 15, 2021  8:58 PM. If you have any questions, ask your nurse or doctor.          acetaminophen 325 MG tablet Commonly known as: TYLENOL Take 2 tablets (650 mg total) by mouth every 4 (four) hours as needed for headache or mild pain.   amLODipine 10 MG tablet Commonly known as: NORVASC Take 1 tablet (10 mg total) by mouth daily.   atorvastatin 40 MG tablet Commonly known as:  LIPITOR Take 1 tablet (40 mg total) by mouth daily.   canagliflozin 300 MG Tabs tablet Commonly known as: Invokana Take 1 tablet (300 mg total) by mouth daily before breakfast.   glucose blood test strip Commonly known as: Accu-Chek Guide 1 each by Other route 3 (three) times daily. Use as instructed to check once daily.   losartan 100 MG tablet Commonly known as: COZAAR Take 1 tablet (100 mg total) by mouth daily. Maintain appt for additional refills   metoprolol tartrate 50 MG tablet Commonly known as: LOPRESSOR Take 1 tablet (50 mg total) by  mouth 2 (two) times daily.   Mirena (52 MG) 20 MCG/DAY Iud Generic drug: levonorgestrel Mirena 20 mcg/24 hours (7 yrs) 52 mg intrauterine device  Take by intrauterine route.   ondansetron 4 MG tablet Commonly known as: Zofran Take 1 tablet (4 mg total) by mouth every 8 (eight) hours as needed for nausea or vomiting.   OneTouch Delica Lancets Fine Misc Use to check blood sugar 3 times daily   oxyCODONE-acetaminophen 5-325 MG tablet Commonly known as: PERCOCET/ROXICET Take 1 tablet by mouth every 6 (six) hours as needed.   polyethylene glycol 17 g packet Commonly known as: MIRALAX / GLYCOLAX Take 17 g by mouth once a week.   spironolactone 100 MG tablet Commonly known as: ALDACTONE Take 1 tablet by mouth once daily   Trulicity 3 XN/2.3FT Sopn Generic drug: Dulaglutide INJECT '3MG'$  SUBCUTANEOUSLY ONCE A WEEK   Trulicity 1.5 DD/2.2GU Sopn Generic drug: Dulaglutide Inject 1.5 mg into the skin once a week.   ULTRAFLORA IMMUNE HEALTH PO Take 1 capsule by mouth daily.        Allergies:  Allergies  Allergen Reactions  . Lisinopril Cough    Past Medical History:  Diagnosis Date  . Abdominal pain 10/04/2020  . Class 2 obesity due to excess calories with body mass index (BMI) of 35.0 to 35.9 in adult 06/2010  . DM type 2 (diabetes mellitus, type 2) ([place])   . Gastric ulcer   . GERD (gastroesophageal reflux disease)   . H/O constipation   . H/O hemorrhoids   . History of small bowel obstruction 06/02/2019  . Hyperlipidemia   . Hypertension   . Hypokalemia 2019  . Menorrhagia   . Primary hyperaldosteronism ([place]) 02/21/2012  . S/P radiofrequency ablation operation for arrhythmia 10/03/20 10/04/2020  . SVT (supraventricular tachycardia) ([place])    Noted 11/2011 admission  . Thrombocytopenia ([place])     Past Surgical History:  Procedure Laterality Date  . ABDOMINAL ADHESION SURGERY  06/03/2019   Dr Marlou Starks  . ABDOMINAL SURGERY     40 months of age-- unsure of type of surgery   . COLONOSCOPY  04/2016   hemorrhoids--normal per pt with Dr Collene Mares  . DILATATION & CURETTAGE/HYSTEROSCOPY WITH MYOSURE N/A 02/08/2020   Procedure: DILATATION & CURETTAGE/HYSTEROSCOPY WITH MYOSURE;  Surgeon: Waymon Amato, MD;  Location: [facility];  Service: Gynecology;  Laterality: N/A;  . HEMORRHOID SURGERY  2005/2006  . INTRAUTERINE DEVICE (IUD) INSERTION N/A 02/08/2020   Procedure: INTRAUTERINE DEVICE (IUD) INSERTION UNDER ULTRASOUND GUIDANCE;  Surgeon: Waymon Amato, MD;  Location: [facility];  Service: Gynecology;  Laterality: N/A;  Mirena  . LAPAROTOMY N/A 06/03/2019   Procedure: EXPLORATORY LAPAROTOMY WITH LYSIS OF ADHESIONS;  Surgeon: Jovita Kussmaul, MD;  Location: [facility];  Service: General;  Laterality: N/A;  . SVT ABLATION N/A 10/03/2020   Procedure: SVT ABLATION;  Surgeon: Evans Lance, MD;  Location:  [facility];  Service: Cardiovascular;  Laterality: N/A;  . UTERINE FIBROID EMBOLIZATION  2010   at [facility]    Family History  Problem Relation Age of Onset  . Cancer Mother 24       breast  . Stroke Mother   . Vision loss Father        some  . Heart disease Father        Rhythm disturbance  . Hypertension Sister   . Hypertension Brother   . Hypertension Brother   . Cancer Maternal Grandmother 98       breast  . Hypertension Maternal Grandmother   . Cancer Other        breast, lung  . Hypertension Other   . Stroke Other   . Cancer Maternal Aunt 50       Breast  . Diabetes Neg Hx     Social History:  reports that she has never smoked. She has never used smokeless tobacco. She reports that she does not drink alcohol and does not use drugs.    Review of Systems    Lipid history: LDL 171 At baseline Lipitor 20 mg prescribed by PCP Her last LDL was above target  Despite reminders she does not take Lipitor regularly and has not taken it daily, last prescription was sent in August    Lab Results  Component Value Date   CHOL 208  (H) 10/14/2021   HDL 62.10 10/14/2021   LDLCALC 134 (H) 10/14/2021   LDLDIRECT 113.0 04/14/2021   TRIG 60.0 10/14/2021   CHOLHDL 3 10/14/2021           Hypertension:   She is on amlodipine, metoprolol and losartan 100 mg prescribed by her PCP  Because of hypokalemia she is also on 100 mg of Aldactone, last potassium normal However she needed potassium supplementation recently in the hospital and has been out of Aldactone for 2 weeks     BP Readings from Last 3 Encounters:  07/21/21 132/80  07/08/21 124/86  05/26/21 130/74    Lab Results  Component Value Date   CREATININE 0.94 10/14/2021   BUN 28 (H) 10/14/2021   NA 130 (L) 10/14/2021   K 4.0 10/14/2021   CL 95 (L) 10/14/2021   CO2 29 10/14/2021     History of goiter with multiple nodules, stable as of the last ultrasound in 2015, largest 1.4 cm nodule in the right side  Lab Results  Component Value Date   TSH 1.04 10/14/2021     Most recent foot exam: 03/2019 Eye exam normal in 02/2019   Review of Systems    Physical Examination:  There were no vitals taken for this visit.  Her thyroid is palpable on the right side about twice normal, nodular and slightly firm, minimally enlarged on the left and relatively more smooth No lymphadenopathy in the neck  No ankle edema    ASSESSMENT/PLAN:  Diabetes type 2, non-insulin-dependent  See history of present illness for detailed discussion of current diabetes management, blood sugar patterns and problems identified  Her A1c is improved and below 6% consistently  She is treated with Trulicity 3 mg weekly, Invokana 300 mg   Her blood sugars are very well controlled although not monitoring at all at home She has lost about 16 pounds in the last 6 months or so with improved diet Also trying to walk very regularly  She will stay on the same regimen and follow-up in 6 months Currently still  not motivated to restart home monitoring  HYPERTENSION: Diastolic is  high normal again She will make sure she takes all her medications including Aldactone regularly  No recent problems with hypokalemia     Lipids: LDL is still above target at 113 Will need for her to take atorvastatin consistently and reminded her to follow-up with her PCP  She has had her flu shot      There are no Patient Instructions on file for this visit.   Elayne Snare 10/15/2021, 8:58 PM   Note: This office note was prepared with Dragon voice recognition system technology. Any transcriptional errors that result from this process are unintentional.

## 2021-10-16 ENCOUNTER — Ambulatory Visit: Payer: 59 | Admitting: Endocrinology

## 2021-10-20 ENCOUNTER — Encounter: Payer: Self-pay | Admitting: Nurse Practitioner

## 2021-10-20 ENCOUNTER — Telehealth (INDEPENDENT_AMBULATORY_CARE_PROVIDER_SITE_OTHER): Payer: 59 | Admitting: Endocrinology

## 2021-10-20 VITALS — Ht 62.0 in | Wt 174.0 lb

## 2021-10-20 DIAGNOSIS — E7800 Pure hypercholesterolemia, unspecified: Secondary | ICD-10-CM | POA: Diagnosis not present

## 2021-10-20 DIAGNOSIS — I10 Essential (primary) hypertension: Secondary | ICD-10-CM

## 2021-10-20 DIAGNOSIS — E1169 Type 2 diabetes mellitus with other specified complication: Secondary | ICD-10-CM | POA: Diagnosis not present

## 2021-10-20 DIAGNOSIS — E871 Hypo-osmolality and hyponatremia: Secondary | ICD-10-CM | POA: Diagnosis not present

## 2021-10-20 DIAGNOSIS — E669 Obesity, unspecified: Secondary | ICD-10-CM

## 2021-10-20 MED ORDER — TRULICITY 1.5 MG/0.5ML ~~LOC~~ SOAJ: 1.5000 mg | SUBCUTANEOUS | 2 refills | Status: DC

## 2021-10-20 MED ORDER — ATORVASTATIN CALCIUM 40 MG PO TABS: 40.0000 mg | ORAL_TABLET | Freq: Every day | ORAL | 0 refills | Status: DC

## 2021-10-20 MED ORDER — ACCU-CHEK GUIDE VI STRP: ORAL_STRIP | 12 refills | Status: DC

## 2021-10-20 NOTE — Progress Notes (Signed)
Patient ID: Susan Whitaker, female   DOB: [birth year], 51 y.o.   MRN: [id number]          I connected with the above-named patient by video enabled telemedicine application and verified that I am speaking with the correct person. The patient was explained the limitations of evaluation and management by telemedicine and the availability of in person appointments.  Patient also understood that there may be a patient responsible charge related to this service  Location of the patient: Patient's home  Location of the provider: Physician office Only the patient and myself were participating in the encounter The patient understood the above statements and agreed to proceed.   Reason for Appointment: Follow-up for Type 2 Diabetes  Referring physician:  Debbrah Alar   History of Present Illness:          Date of diagnosis of type 2 diabetes mellitus: 2013        Background history:  She was diagnosed to have diabetes when she was having fibroid surgery. A1c in 2013 was 6.9 She was treated with metformin but she says she was not able to take this because of diarrhea and would be irregular with it However her blood sugars stayed about the same until 2015 when they were higher and glipizide added Her blood sugars had been much higher since about late 2015 with A1c in the 9-10 range with her taking glipizide alone  Recent history:   Non-insulin hypoglycemic drugs: Invokana 240 mg daily, Trulicity 3 mg weekly  Current management, levels of control, and problems identified:  Her A1c had gone up to 8.1 in November 2019 but has been below 6% subsequently  A1c is now 5.5 compared to 5.9   She has not checked her blood sugars recently at home  Fasting lab glucose only 87 Renal function continues to be normal with Invokana and no other side effects He has no nausea with 3 mg Trulicity  She has not missed any doses lately but had difficulties in the first part of the year However appears  to have had some further decrease in appetite and eating smaller meals with continued weight loss She is generally walking about 20-30 minutes a day  Side effects from medications have been: Diarrhea from regular metformin   Glucose monitoring: Irregularly    Blood Glucose readings mostly 90-100 range, did not bring monitor for download   Self-care: The diet that the patient has been following is: tries to limit Portions .     Typical meal intake: Breakfast is  fruit, Activia Yogurt,  For snacks she will have Snack box, chicken breast at dinnertime.  No Sweet drinks                  Dietician visit, most recent: 5/17, has  had diabetes education classes                Weight history:  Wt Readings from Last 3 Encounters:  10/20/21 174 lb (78.9 kg)  07/21/21 182 lb 6.4 oz (82.7 kg)  07/08/21 182 lb 12.8 oz (82.9 kg)    Glycemic control:   Lab Results  Component Value Date   HGBA1C 5.5 10/14/2021   HGBA1C 5.9 04/15/2021   HGBA1C 5.5 09/23/2020   Lab Results  Component Value Date   MICROALBUR 1.2 10/14/2021   LDLCALC 134 (H) 10/14/2021   CREATININE 0.94 10/14/2021   Lab Results  Component Value Date   MICRALBCREAT 0.9 10/14/2021   Lab  on 10/14/2021  Component Date Value Ref Range Status   Microalb, Ur 10/14/2021 1.2  0.0 - 1.9 mg/dL Final   Creatinine,U 10/14/2021 139.6  mg/dL Final   Microalb Creat Ratio 10/14/2021 0.9  0.0 - 30.0 mg/g Final   TSH 10/14/2021 1.04  0.35 - 5.50 uIU/mL Final   Cholesterol 10/14/2021 208 (H)  0 - 200 mg/dL Final   ATP III Classification       Desirable:  < 200 mg/dL               Borderline High:  200 - 239 mg/dL          High:  > = 240 mg/dL   Triglycerides 10/14/2021 60.0  0.0 - 149.0 mg/dL Final   Normal:  <150 mg/dLBorderline High:  150 - 199 mg/dL   HDL 10/14/2021 62.10  >39.00 mg/dL Final   VLDL 10/14/2021 12.0  0.0 - 40.0 mg/dL Final   LDL Cholesterol 10/14/2021 134 (H)  0 - 99 mg/dL Final   Total CHOL/HDL Ratio 10/14/2021 3    Final                  Men          Women1/2 Average Risk     3.4          3.3Average Risk          5.0          4.42X Average Risk          9.6          7.13X Average Risk          15.0          11.0                       NonHDL 10/14/2021 145.56   Final   NOTE:  Non-HDL goal should be 30 mg/dL higher than patient's LDL goal (i.e. LDL goal of < 70 mg/dL, would have non-HDL goal of < 100 mg/dL)   Sodium 10/14/2021 130 (L)  135 - 145 mEq/L Final   Potassium 10/14/2021 4.0  3.5 - 5.1 mEq/L Final   Chloride 10/14/2021 95 (L)  96 - 112 mEq/L Final   CO2 10/14/2021 29  19 - 32 mEq/L Final   Glucose, Bld 10/14/2021 87  70 - 99 mg/dL Final   BUN 10/14/2021 28 (H)  6 - 23 mg/dL Final   Creatinine, Ser 10/14/2021 0.94  0.40 - 1.20 mg/dL Final   Total Bilirubin 10/14/2021 0.5  0.2 - 1.2 mg/dL Final   Alkaline Phosphatase 10/14/2021 69  39 - 117 U/L Final   AST 10/14/2021 21  0 - 37 U/L Final   ALT 10/14/2021 31  0 - 35 U/L Final   Total Protein 10/14/2021 8.1  6.0 - 8.3 g/dL Final   Albumin 10/14/2021 4.3  3.5 - 5.2 g/dL Final   GFR 10/14/2021 69.72  >60.00 mL/min Final   Calculated using the CKD-EPI Creatinine Equation (2021)   Calcium 10/14/2021 9.7  8.4 - 10.5 mg/dL Final   Hgb A1c MFr Bld 10/14/2021 5.5  4.6 - 6.5 % Final   Glycemic Control Guidelines for People with Diabetes:Non Diabetic:  <6%Goal of Therapy: <7%Additional Action Suggested:  >8%      Other active problems are in review of systems    Allergies as of 10/20/2021       Reactions   Lisinopril Cough  Medication List        Accurate as of October 20, 2021 10:12 AM. If you have any questions, ask your nurse or doctor.          acetaminophen 325 MG tablet Commonly known as: TYLENOL Take 2 tablets (650 mg total) by mouth every 4 (four) hours as needed for headache or mild pain.   amLODipine 10 MG tablet Commonly known as: NORVASC Take 1 tablet (10 mg total) by mouth daily.   atorvastatin 40 MG tablet Commonly  known as: LIPITOR Take 1 tablet (40 mg total) by mouth daily.   canagliflozin 300 MG Tabs tablet Commonly known as: Invokana Take 1 tablet (300 mg total) by mouth daily before breakfast.   glucose blood test strip Commonly known as: Accu-Chek Guide 1 each by Other route 3 (three) times daily. Use as instructed to check once daily.   losartan 100 MG tablet Commonly known as: COZAAR Take 1 tablet (100 mg total) by mouth daily. Maintain appt for additional refills   metoprolol tartrate 50 MG tablet Commonly known as: LOPRESSOR Take 1 tablet (50 mg total) by mouth 2 (two) times daily.   Mirena (52 MG) 20 MCG/DAY Iud Generic drug: levonorgestrel Mirena 20 mcg/24 hours (7 yrs) 52 mg intrauterine device  Take by intrauterine route.   ondansetron 4 MG tablet Commonly known as: Zofran Take 1 tablet (4 mg total) by mouth every 8 (eight) hours as needed for nausea or vomiting.   OneTouch Delica Lancets Fine Misc Use to check blood sugar 3 times daily   oxyCODONE-acetaminophen 5-325 MG tablet Commonly known as: PERCOCET/ROXICET Take 1 tablet by mouth every 6 (six) hours as needed.   polyethylene glycol 17 g packet Commonly known as: MIRALAX / GLYCOLAX Take 17 g by mouth once a week.   spironolactone 100 MG tablet Commonly known as: ALDACTONE Take 1 tablet by mouth once daily   Trulicity 3 JK/9.3OI Sopn Generic drug: Dulaglutide INJECT '3MG'$  SUBCUTANEOUSLY ONCE A WEEK   Trulicity 1.5 ZT/2.4PY Sopn Generic drug: Dulaglutide Inject 1.5 mg into the skin once a week.   ULTRAFLORA IMMUNE HEALTH PO Take 1 capsule by mouth daily.        Allergies:  Allergies  Allergen Reactions   Lisinopril Cough    Past Medical History:  Diagnosis Date   Abdominal pain 10/04/2020   Class 2 obesity due to excess calories with body mass index (BMI) of 35.0 to 35.9 in adult 06/2010   DM type 2 (diabetes mellitus, type 2) (HCC)    Gastric ulcer    GERD (gastroesophageal reflux disease)     H/O constipation    H/O hemorrhoids    History of small bowel obstruction 06/02/2019   Hyperlipidemia    Hypertension    Hypokalemia 2019   Menorrhagia    Primary hyperaldosteronism ([place]) 02/21/2012   S/P radiofrequency ablation operation for arrhythmia 10/03/20 10/04/2020   SVT (supraventricular tachycardia) ([place])    Noted 11/2011 admission   Thrombocytopenia [facility])     Past Surgical History:  Procedure Laterality Date   ABDOMINAL ADHESION SURGERY  06/03/2019   Dr Marlou Starks   ABDOMINAL SURGERY     20 months of age-- unsure of type of surgery   COLONOSCOPY  04/2016   hemorrhoids--normal per pt with Dr Collene Mares   DILATATION & CURETTAGE/HYSTEROSCOPY WITH MYOSURE N/A 02/08/2020   Procedure: DILATATION & CURETTAGE/HYSTEROSCOPY WITH MYOSURE;  Surgeon: Waymon Amato, MD;  Location: [place];  Service: Gynecology;  Laterality: N/A;  HEMORRHOID SURGERY  2005/2006   INTRAUTERINE DEVICE (IUD) INSERTION N/A 02/08/2020   Procedure: INTRAUTERINE DEVICE (IUD) INSERTION UNDER ULTRASOUND GUIDANCE;  Surgeon: Waymon Amato, MD;  Location: [place];  Service: Gynecology;  Laterality: N/A;  Mirena   LAPAROTOMY N/A 06/03/2019   Procedure: EXPLORATORY LAPAROTOMY WITH LYSIS OF ADHESIONS;  Surgeon: Jovita Kussmaul, MD;  Location: [facility];  Service: General;  Laterality: N/A;   SVT ABLATION N/A 10/03/2020   Procedure: SVT ABLATION;  Surgeon: Evans Lance, MD;  Location: [place] [facility];  Service: Cardiovascular;  Laterality: N/A;   UTERINE FIBROID EMBOLIZATION  2010   at [facility]    Family History  Problem Relation Age of Onset   Cancer Mother 75       breast   Stroke Mother    Vision loss Father        some   Heart disease Father        Rhythm disturbance   Hypertension Sister    Hypertension Brother    Hypertension Brother    Cancer Maternal Grandmother 98       breast   Hypertension Maternal Grandmother    Cancer Other        breast, lung   Hypertension Other    Stroke  Other    Cancer Maternal Aunt 50       Breast   Diabetes Neg Hx     Social History:  reports that she has never smoked. She has never used smokeless tobacco. She reports that she does not drink alcohol and does not use drugs.    Review of Systems    Lipid history: LDL 171 At baseline Lipitor 20 mg prescribed by PCP Her last LDL was above target  Despite reminders she does not take Lipitor regularly and has not taken it daily, last prescription was sent in August    Lab Results  Component Value Date   CHOL 208 (H) 10/14/2021   HDL 62.10 10/14/2021   LDLCALC 134 (H) 10/14/2021   LDLDIRECT 113.0 04/14/2021   TRIG 60.0 10/14/2021   CHOLHDL 3 10/14/2021           Hypertension:   She is on amlodipine, metoprolol and losartan 100 mg prescribed by her PCP  Because of hypokalemia she is also on 100 mg of Aldactone, last potassium normal However her sodium is low which is not usual She did not on any thiazide diuretics or SSRIs     BP Readings from Last 3 Encounters:  07/21/21 132/80  07/08/21 124/86  05/26/21 130/74    Lab Results  Component Value Date   CREATININE 0.94 10/14/2021   BUN 28 (H) 10/14/2021   NA 130 (L) 10/14/2021   K 4.0 10/14/2021   CL 95 (L) 10/14/2021   CO2 29 10/14/2021     History of goiter with multiple nodules, stable as of the last ultrasound in 2015, largest 1.4 cm nodule in the right side  Lab Results  Component Value Date   TSH 1.04 10/14/2021     Most recent foot exam: 03/2019 Eye exam normal in 02/2019   Review of Systems  Has history of small goiter  Physical Examination:  Ht '5\' 2"'$  (1.575 m)   Wt 174 lb (78.9 kg)   BMI 31.83 kg/m    No exam done    ASSESSMENT/PLAN:  Diabetes type 2, non-insulin-dependent  See history of present illness for detailed discussion of current diabetes management, blood sugar patterns and problems  identified  Her A1c is improved and below 6% consistently  She is treated with  Trulicity 3 mg weekly, Invokana 300 mg   Her blood sugars are very well controlled although not monitoring at all at home She has lost further weight and likely is not needing as much Trulicity for maintenance and we will reduce her dose to 1.5  She will stay on the same regimen and follow-up in 6 months We will send new test trips for her to start monitoring at different times  HYPERTENSION: Blood pressure appears to be fairly good lately However unclear why she has mild HYPONATREMIA and will reduce her Aldactone to half a tablet     Lipids: LDL is still above target  Again reminded her of taking her medication regularly and refills of Lipitor sent       There are no Patient Instructions on file for this visit.   Elayne Snare 10/20/2021, 10:12 AM   Note: This office note was prepared with Dragon voice recognition system technology. Any transcriptional errors that result from this process are unintentional.

## 2021-11-22 ENCOUNTER — Other Ambulatory Visit: Payer: Self-pay | Admitting: Nurse Practitioner

## 2021-11-22 DIAGNOSIS — I10 Essential (primary) hypertension: Secondary | ICD-10-CM

## 2021-11-23 NOTE — Telephone Encounter (Signed)
Chart supports Rx Last OV: 07/2021 Next OV: 01/2022

## 2021-12-07 ENCOUNTER — Other Ambulatory Visit: Payer: Self-pay | Admitting: Endocrinology

## 2021-12-07 ENCOUNTER — Other Ambulatory Visit: Payer: Self-pay | Admitting: Nurse Practitioner

## 2021-12-07 DIAGNOSIS — E1169 Type 2 diabetes mellitus with other specified complication: Secondary | ICD-10-CM

## 2021-12-07 DIAGNOSIS — I10 Essential (primary) hypertension: Secondary | ICD-10-CM

## 2021-12-08 NOTE — Telephone Encounter (Signed)
Chart supports Rx Last OV: 07/2021 Next OV: 01/2022

## 2022-01-22 ENCOUNTER — Encounter: Payer: Self-pay | Admitting: Nurse Practitioner

## 2022-01-22 ENCOUNTER — Ambulatory Visit: Payer: 59 | Admitting: Nurse Practitioner

## 2022-01-22 VITALS — BP 144/80 | HR 79 | Temp 97.0°F | Ht 62.0 in | Wt 183.4 lb

## 2022-01-22 DIAGNOSIS — E1169 Type 2 diabetes mellitus with other specified complication: Secondary | ICD-10-CM | POA: Diagnosis not present

## 2022-01-22 DIAGNOSIS — I471 Supraventricular tachycardia: Secondary | ICD-10-CM

## 2022-01-22 DIAGNOSIS — D75839 Thrombocytosis, unspecified: Secondary | ICD-10-CM | POA: Insufficient documentation

## 2022-01-22 DIAGNOSIS — Z23 Encounter for immunization: Secondary | ICD-10-CM | POA: Diagnosis not present

## 2022-01-22 DIAGNOSIS — D473 Essential (hemorrhagic) thrombocythemia: Secondary | ICD-10-CM

## 2022-01-22 DIAGNOSIS — I10 Essential (primary) hypertension: Secondary | ICD-10-CM | POA: Diagnosis not present

## 2022-01-22 DIAGNOSIS — E785 Hyperlipidemia, unspecified: Secondary | ICD-10-CM | POA: Diagnosis not present

## 2022-01-22 LAB — CBC WITH DIFFERENTIAL/PLATELET
Basophils Absolute: 0.1 10*3/uL (ref 0.0–0.1)
Basophils Relative: 1 % (ref 0.0–3.0)
Eosinophils Absolute: 0.1 10*3/uL (ref 0.0–0.7)
Eosinophils Relative: 1.7 % (ref 0.0–5.0)
HCT: 40.1 % (ref 36.0–46.0)
Hemoglobin: 13.5 g/dL (ref 12.0–15.0)
Lymphocytes Relative: 46.3 % — ABNORMAL HIGH (ref 12.0–46.0)
Lymphs Abs: 2.9 10*3/uL (ref 0.7–4.0)
MCHC: 33.7 g/dL (ref 30.0–36.0)
MCV: 88.1 fl (ref 78.0–100.0)
Monocytes Absolute: 0.4 10*3/uL (ref 0.1–1.0)
Monocytes Relative: 7.1 % (ref 3.0–12.0)
Neutro Abs: 2.7 10*3/uL (ref 1.4–7.7)
Neutrophils Relative %: 43.9 % (ref 43.0–77.0)
Platelets: 447 10*3/uL — ABNORMAL HIGH (ref 150.0–400.0)
RBC: 4.55 Mil/uL (ref 3.87–5.11)
RDW: 13.6 % (ref 11.5–15.5)
WBC: 6.2 10*3/uL (ref 4.0–10.5)

## 2022-01-22 LAB — RENAL FUNCTION PANEL
Albumin: 3.9 g/dL (ref 3.5–5.2)
BUN: 20 mg/dL (ref 6–23)
CO2: 31 mEq/L (ref 19–32)
Calcium: 9.1 mg/dL (ref 8.4–10.5)
Chloride: 103 mEq/L (ref 96–112)
Creatinine, Ser: 0.81 mg/dL (ref 0.40–1.20)
GFR: 83.19 mL/min (ref >60.00)
Glucose, Bld: 104 mg/dL — ABNORMAL HIGH (ref 70–99)
Phosphorus: 2.8 mg/dL (ref 2.3–4.6)
Potassium: 3.2 mEq/L — ABNORMAL LOW (ref 3.5–5.1)
Sodium: 141 mEq/L (ref 135–145)

## 2022-01-22 LAB — LDL CHOLESTEROL, DIRECT: Direct LDL: 128 mg/dL

## 2022-01-22 MED ORDER — SPIRONOLACTONE 100 MG PO TABS: 100.0000 mg | ORAL_TABLET | Freq: Every day | ORAL | 0 refills | Status: DC

## 2022-01-22 NOTE — Assessment & Plan Note (Addendum)
She discontinue metoprolol, stating no incidence of palpitations or SOB or CP. S/p SVT ablation 09/2020

## 2022-01-22 NOTE — Progress Notes (Signed)
Established Patient Visit  Patient: Susan Whitaker   DOB: [birth year]   51 y.o. Female  MRN: [id number] Visit Date: 01/22/2022  Subjective:    Chief Complaint  Patient presents with   Office Visit    HTN/ Hyperlipidemia F/u Pt fasting  Doesn't check BP often No concerns  Flu shot given today    HPI Essential thrombocythemia ([place]) Repeat cbc No CP or SOB or DVT/PE  Hyperlipidemia associated with type 2 diabetes mellitus ([place]) Repeat lipid panel Maintain atorvastatin dose  SVT (supraventricular tachycardia) ([place]) She discontinue metoprolol, stating no incidence of palpitations or SOB or CP. S/p SVT ablation 09/2020  HTN (hypertension) Elevated BP today, has not yet taken BP meds today asymtomatic She has not taken spironolactone x 3weeks Current use of amlodipine and losartan BP Readings from Last 3 Encounters:  01/22/22 (!) 144/80  07/21/21 132/80  07/08/21 124/86   Repeat BMP Maintain above med dose Advised about importance of med and diet compliance. Advise to monitor BP at home Call office if BP persistently>140/80    Wt Readings from Last 3 Encounters:  01/22/22 183 lb 6.4 oz (83.2 kg)  10/20/21 174 lb (78.9 kg)  07/21/21 182 lb 6.4 oz (82.7 kg)    Reviewed medical, surgical, and social history today  Medications: Outpatient Medications Prior to Visit  Medication Sig   acetaminophen (TYLENOL) 325 MG tablet Take 2 tablets (650 mg total) by mouth every 4 (four) hours as needed for headache or mild pain.   amLODipine (NORVASC) 10 MG tablet Take 1 tablet by mouth once daily   atorvastatin (LIPITOR) 40 MG tablet Take 1 tablet (40 mg total) by mouth daily.   Dulaglutide (TRULICITY) 1.5 DQ/2.2WL SOPN Inject 1.5 mg into the skin once a week.   glucose blood (ACCU-CHEK GUIDE) test strip Use as instructed to check once daily.   INVOKANA 300 MG TABS tablet TAKE 1 TABLET BY MOUTH ONCE DAILY BEFORE BREAKFAST   levonorgestrel (MIRENA, 52 MG,) 20 MCG/DAY  IUD Mirena 20 mcg/24 hours (7 yrs) 52 mg intrauterine device  Take by intrauterine route.   losartan (COZAAR) 100 MG tablet TAKE 1 TABLET BY MOUTH ONCE DAILY . APPOINTMENT REQUIRED FOR FUTURE REFILLS   ONETOUCH DELICA LANCETS FINE MISC Use to check blood sugar 3 times daily   oxyCODONE-acetaminophen (PERCOCET/ROXICET) 5-325 MG tablet Take 1 tablet by mouth every 6 (six) hours as needed.   polyethylene glycol (MIRALAX / GLYCOLAX) 17 g packet Take 17 g by mouth once a week.   Probiotic Product (ULTRAFLORA IMMUNE HEALTH PO) Take 1 capsule by mouth daily.   [DISCONTINUED] metoprolol tartrate (LOPRESSOR) 50 MG tablet Take 1 tablet (50 mg total) by mouth 2 (two) times daily. (Patient not taking: Reported on 10/20/2021)   [DISCONTINUED] ondansetron (ZOFRAN) 4 MG tablet Take 1 tablet (4 mg total) by mouth every 8 (eight) hours as needed for nausea or vomiting. (Patient not taking: Reported on 10/20/2021)   [DISCONTINUED] spironolactone (ALDACTONE) 100 MG tablet Take 1 tablet by mouth once daily (Patient not taking: Reported on 01/22/2022)   No facility-administered medications prior to visit.   Reviewed past medical and social history.   ROS per HPI above      Objective:  BP (!) 144/80 (BP Location: Right Arm, Patient Position: Sitting, Cuff Size: Normal)   Pulse 79   Temp (!) 97 F (36.1 C) (Temporal)   Ht '5\' 2"'$  (1.575 m)  Wt 183 lb 6.4 oz (83.2 kg)   SpO2 97%   BMI 33.54 kg/m      Physical Exam Vitals reviewed.  Cardiovascular:     Rate and Rhythm: Normal rate and regular rhythm.     Pulses: Normal pulses.     Heart sounds: Normal heart sounds.  Pulmonary:     Effort: Pulmonary effort is normal.     Breath sounds: Normal breath sounds.  Musculoskeletal:     Right lower leg: No edema.     Left lower leg: No edema.  Neurological:     Mental Status: She is alert and oriented to person, place, and time.     No results found for any visits on 01/22/22.    Assessment & Plan:     Problem List Items Addressed This Visit       Cardiovascular and Mediastinum   HTN (hypertension) - Primary    Elevated BP today, has not yet taken BP meds today asymtomatic She has not taken spironolactone x 3weeks Current use of amlodipine and losartan BP Readings from Last 3 Encounters:  01/22/22 (!) 144/80  07/21/21 132/80  07/08/21 124/86   Repeat BMP Maintain above med dose Advised about importance of med and diet compliance. Advise to monitor BP at home Call office if BP persistently>140/80       Relevant Medications   spironolactone (ALDACTONE) 100 MG tablet   Other Relevant Orders   Renal Function Panel   SVT (supraventricular tachycardia) (HCC)    She discontinue metoprolol, stating no incidence of palpitations or SOB or CP. S/p SVT ablation 09/2020      Relevant Medications   spironolactone (ALDACTONE) 100 MG tablet     Endocrine   Hyperlipidemia associated with type 2 diabetes mellitus (HCC)    Repeat lipid panel Maintain atorvastatin dose      Relevant Medications   spironolactone (ALDACTONE) 100 MG tablet   Other Relevant Orders   Direct LDL     Hematopoietic and Hemostatic   Essential thrombocythemia ([place])    Repeat cbc No CP or SOB or DVT/PE      Relevant Orders   CBC with Differential/Platelet   Other Visit Diagnoses     Supraventricular tachycardia ([place])   (Chronic)     Relevant Medications   spironolactone (ALDACTONE) 100 MG tablet   Need for immunization against influenza       Relevant Orders   Flu Vaccine QUAD High Dose(Fluad) (Completed)      Return in about 6 months (around 07/23/2022) for CPE (fasting).     Wilfred Lacy, NP

## 2022-01-22 NOTE — Assessment & Plan Note (Signed)
Repeat lipid panel Maintain atorvastatin dose 

## 2022-01-22 NOTE — Assessment & Plan Note (Signed)
Elevated BP today, has not yet taken BP meds today asymtomatic She has not taken spironolactone x 3weeks Current use of amlodipine and losartan BP Readings from Last 3 Encounters:  01/22/22 (!) 144/80  07/21/21 132/80  07/08/21 124/86   Repeat BMP Maintain above med dose Advised about importance of med and diet compliance. Advise to monitor BP at home Call office if BP persistently>140/80

## 2022-01-22 NOTE — Assessment & Plan Note (Signed)
Repeat cbc No CP or SOB or DVT/PE

## 2022-01-22 NOTE — Patient Instructions (Addendum)
Go to lab Resume spironolactone Maintain other med doses Monitor BP at home in AM 3x/week Maintain heart healthy diet and daily exercise. Send copy of mammogram report

## 2022-01-27 ENCOUNTER — Telehealth: Payer: Self-pay | Admitting: Nurse Practitioner

## 2022-01-27 ENCOUNTER — Telehealth: Payer: Self-pay | Admitting: Internal Medicine

## 2022-01-27 DIAGNOSIS — D75839 Thrombocytosis, unspecified: Secondary | ICD-10-CM

## 2022-01-27 NOTE — Telephone Encounter (Signed)
Scheduled appt per 9/13 referral. Pt is aware of appt date and time. Pt is aware to arrive 15 mins prior to appt time and to bring and updated insurance card. Pt is aware of appt location.   

## 2022-01-27 NOTE — Assessment & Plan Note (Signed)
Entered hematology referral

## 2022-01-27 NOTE — Telephone Encounter (Signed)
-----   Message from Lucillie Garfinkel, [place] sent at 01/25/2022  4:16 PM EDT ----- Called & spoke w/ pt, she said had seen one before but would like another referral. ----- Message ----- From: Flossie Buffy, NP Sent: 01/25/2022   3:17 PM EDT To: Charolette Child Wall, CMA  Persistent elevated platelet. Did she see a hematologist in the past? Mild decrease in potassium: eat potassium rich foods like dark green vegetable, beans, avocado, tomatoes, yogurt etc Normal LDL and renal function

## 2022-02-03 ENCOUNTER — Telehealth: Payer: Self-pay

## 2022-02-03 NOTE — Telephone Encounter (Signed)
Spoke with patient to schedule an appointment for mammogram screening and she informed me that she will reach out to her OBGYN about her mammogram results.

## 2022-02-09 ENCOUNTER — Telehealth: Payer: Self-pay | Admitting: Physician Assistant

## 2022-02-09 NOTE — Telephone Encounter (Signed)
R/s pt's new hem appt per provider. Pt is aware of new appt date/time.

## 2022-02-15 ENCOUNTER — Telehealth: Payer: Self-pay | Admitting: Internal Medicine

## 2022-02-15 ENCOUNTER — Encounter: Payer: 59 | Admitting: Internal Medicine

## 2022-02-15 ENCOUNTER — Other Ambulatory Visit: Payer: 59

## 2022-02-15 NOTE — Telephone Encounter (Signed)
Contacted patient to scheduled appointments. Patient is aware of appointments that are scheduled.   

## 2022-02-16 ENCOUNTER — Inpatient Hospital Stay: Payer: 59 | Admitting: Physician Assistant

## 2022-02-16 ENCOUNTER — Inpatient Hospital Stay: Payer: 59

## 2022-02-17 ENCOUNTER — Encounter: Payer: Self-pay | Admitting: Endocrinology

## 2022-02-17 DIAGNOSIS — E1169 Type 2 diabetes mellitus with other specified complication: Secondary | ICD-10-CM

## 2022-02-22 MED ORDER — TRULICITY 3 MG/0.5ML ~~LOC~~ SOAJ: 3.0000 mg | SUBCUTANEOUS | 1 refills | Status: DC

## 2022-02-24 ENCOUNTER — Other Ambulatory Visit: Payer: Self-pay | Admitting: Internal Medicine

## 2022-02-24 ENCOUNTER — Inpatient Hospital Stay (HOSPITAL_BASED_OUTPATIENT_CLINIC_OR_DEPARTMENT_OTHER): Payer: 59 | Admitting: Internal Medicine

## 2022-02-24 ENCOUNTER — Other Ambulatory Visit: Payer: Self-pay

## 2022-02-24 ENCOUNTER — Inpatient Hospital Stay: Payer: 59 | Attending: Physician Assistant

## 2022-02-24 DIAGNOSIS — Z803 Family history of malignant neoplasm of breast: Secondary | ICD-10-CM | POA: Diagnosis not present

## 2022-02-24 DIAGNOSIS — D75838 Other thrombocytosis: Secondary | ICD-10-CM | POA: Diagnosis not present

## 2022-02-24 DIAGNOSIS — E119 Type 2 diabetes mellitus without complications: Secondary | ICD-10-CM | POA: Diagnosis not present

## 2022-02-24 DIAGNOSIS — D75839 Thrombocytosis, unspecified: Secondary | ICD-10-CM | POA: Insufficient documentation

## 2022-02-24 DIAGNOSIS — Z801 Family history of malignant neoplasm of trachea, bronchus and lung: Secondary | ICD-10-CM

## 2022-02-24 DIAGNOSIS — I10 Essential (primary) hypertension: Secondary | ICD-10-CM | POA: Diagnosis not present

## 2022-02-24 LAB — IRON AND IRON BINDING CAPACITY (CC-WL,HP ONLY)
Iron: 90 ug/dL (ref 28–170)
Saturation Ratios: 22 % (ref 10.4–31.8)
TIBC: 405 ug/dL (ref 250–450)
UIBC: 315 ug/dL (ref 148–442)

## 2022-02-24 LAB — CBC WITH DIFFERENTIAL (CANCER CENTER ONLY)
Abs Immature Granulocytes: 0.01 10*3/uL (ref 0.00–0.07)
Basophils Absolute: 0 10*3/uL (ref 0.0–0.1)
Basophils Relative: 0 %
Eosinophils Absolute: 0.1 10*3/uL (ref 0.0–0.5)
Eosinophils Relative: 1 %
HCT: 43 % (ref 36.0–46.0)
Hemoglobin: 14.2 g/dL (ref 12.0–15.0)
Immature Granulocytes: 0 %
Lymphocytes Relative: 49 %
Lymphs Abs: 3.5 10*3/uL (ref 0.7–4.0)
MCH: 29 pg (ref 26.0–34.0)
MCHC: 33 g/dL (ref 30.0–36.0)
MCV: 87.8 fL (ref 80.0–100.0)
Monocytes Absolute: 0.5 10*3/uL (ref 0.1–1.0)
Monocytes Relative: 7 %
Neutro Abs: 3 10*3/uL (ref 1.7–7.7)
Neutrophils Relative %: 43 %
Platelet Count: 393 10*3/uL (ref 150–400)
RBC: 4.9 MIL/uL (ref 3.87–5.11)
RDW: 13.4 % (ref 11.5–15.5)
WBC Count: 7.1 10*3/uL (ref 4.0–10.5)
nRBC: 0 % (ref 0.0–0.2)

## 2022-02-24 LAB — CMP (CANCER CENTER ONLY)
ALT: 19 U/L (ref 0–44)
AST: 16 U/L (ref 15–41)
Albumin: 4.4 g/dL (ref 3.5–5.0)
Alkaline Phosphatase: 92 U/L (ref 38–126)
Anion gap: 6 (ref 5–15)
BUN: 23 mg/dL — ABNORMAL HIGH (ref 6–20)
CO2: 30 mmol/L (ref 22–32)
Calcium: 9.6 mg/dL (ref 8.9–10.3)
Chloride: 103 mmol/L (ref 98–111)
Creatinine: 0.94 mg/dL (ref 0.44–1.00)
GFR, Estimated: >60 mL/min (ref >60)
Glucose, Bld: 103 mg/dL — ABNORMAL HIGH (ref 70–99)
Potassium: 3.9 mmol/L (ref 3.5–5.1)
Sodium: 139 mmol/L (ref 135–145)
Total Bilirubin: 0.4 mg/dL (ref 0.3–1.2)
Total Protein: 8.5 g/dL — ABNORMAL HIGH (ref 6.5–8.1)

## 2022-02-24 LAB — FERRITIN: Ferritin: 36 ng/mL (ref 11–307)

## 2022-02-24 NOTE — Progress Notes (Signed)
[place] [phone]   [phone]  CONSULT NOTE  REFERRING PHYSICIAN: Flossie Buffy, NP  REASON FOR CONSULTATION:  51 years old African-American female with elevated platelet count.  HPI Susan Whitaker is a 51 y.o. female with past medical history significant for hypertension, dyslipidemia, menorrhagia, history of hemorrhoids, gastric ulcer, GERD, diabetes mellitus, SVT and menorrhagia.  The patient was seen recently by her primary care provider and noticed on CBC on 01/22/2022 to have elevated platelets count of 447,000.  She had previous similar problems with elevated platelet count over the last year or so.  It has always been slightly higher than the normal range.  She was referred to me today for evaluation and recommendation regarding her condition.  I saw this patient in the past in 2014 for similar problems and it was thought at that time to be reactive thrombocytosis.  The patient had elevated platelet count since at least 2009.  It has been up and down all this years. When seen today she is feeling fine with no concerning complaints.  She continues to have intermittent abdominal pain secondary to surgery for the small bowel obstruction.  She takes pain medication by her primary care physician.  She denied having any bleeding, bruises or ecchymosis.  She has no chest pain, shortness of breath, cough or hemoptysis.  She has no nausea, vomiting, diarrhea or constipation. Family history significant for mother with breast cancer.  Father had heart disease.  Maternal grandmother had breast cancer and daughter had anemia. The patient is single and she was accompanied today by her partner.  She has 2 children.  She currently works for the city of [place] for [employer].  She has no history for smoking, alcohol or drug abuse.  HPI  Past Medical History:  Diagnosis Date   Abdominal pain 10/04/2020   Class 2 obesity due to excess calories  with body mass index (BMI) of 35.0 to 35.9 in adult 06/2010   DM type 2 (diabetes mellitus, type 2) (HCC)    Gastric ulcer    GERD (gastroesophageal reflux disease)    H/O constipation    H/O hemorrhoids    History of small bowel obstruction 06/02/2019   Hyperlipidemia    Hypertension    Hypokalemia 2019   Menorrhagia    Primary hyperaldosteronism ([place]) 02/21/2012   S/P radiofrequency ablation operation for arrhythmia 10/03/20 10/04/2020   SVT (supraventricular tachycardia) ([place])    Noted 11/2011 admission   Thrombocytopenia [facility])     Past Surgical History:  Procedure Laterality Date   ABDOMINAL ADHESION SURGERY  06/03/2019   Dr Marlou Starks   ABDOMINAL SURGERY     101 months of age-- unsure of type of surgery   COLONOSCOPY  04/2016   hemorrhoids--normal per pt with Dr Collene Mares   DILATATION & CURETTAGE/HYSTEROSCOPY WITH MYOSURE N/A 02/08/2020   Procedure: DILATATION & CURETTAGE/HYSTEROSCOPY WITH MYOSURE;  Surgeon: Waymon Amato, MD;  Location: [place];  Service: Gynecology;  Laterality: N/A;   HEMORRHOID SURGERY  2005/2006   INTRAUTERINE DEVICE (IUD) INSERTION N/A 02/08/2020   Procedure: INTRAUTERINE DEVICE (IUD) INSERTION UNDER ULTRASOUND GUIDANCE;  Surgeon: Waymon Amato, MD;  Location: [place];  Service: Gynecology;  Laterality: N/A;  Mirena   LAPAROTOMY N/A 06/03/2019   Procedure: EXPLORATORY LAPAROTOMY WITH LYSIS OF ADHESIONS;  Surgeon: Jovita Kussmaul, MD;  Location[facility];  Service: General;  Laterality: N/A;   SVT ABLATION N/A 10/03/2020   Procedure:  SVT ABLATION;  Surgeon: Evans Lance, MD;  Location: [place] [facility];  Service: Cardiovascular;  Laterality: N/A;   UTERINE FIBROID EMBOLIZATION  2010   at [facility]    Family History  Problem Relation Age of Onset   Cancer Mother 97       breast   Stroke Mother    Vision loss Father        some   Heart disease Father        Rhythm disturbance   Hypertension Sister    Hypertension Brother     Hypertension Brother    Cancer Maternal Grandmother 98       breast   Hypertension Maternal Grandmother    Cancer Other        breast, lung   Hypertension Other    Stroke Other    Cancer Maternal Aunt 50       Breast   Diabetes Neg Hx     Social History Social History   Tobacco Use   Smoking status: Never   Smokeless tobacco: Never  Vaping Use   Vaping Use: Never used  Substance Use Topics   Alcohol use: No   Drug use: No    Allergies  Allergen Reactions   Lisinopril Cough    Current Outpatient Medications  Medication Sig Dispense Refill   acetaminophen (TYLENOL) 325 MG tablet Take 2 tablets (650 mg total) by mouth every 4 (four) hours as needed for headache or mild pain.     amLODipine (NORVASC) 10 MG tablet Take 1 tablet by mouth once daily 30 tablet 0   atorvastatin (LIPITOR) 40 MG tablet Take 1 tablet (40 mg total) by mouth daily. 90 tablet 0   Dulaglutide (TRULICITY) 3 TI/4.5YK SOPN Inject 3 mg as directed once a week. 2 mL 1   glucose blood (ACCU-CHEK GUIDE) test strip Use as instructed to check once daily. 50 each 12   INVOKANA 300 MG TABS tablet TAKE 1 TABLET BY MOUTH ONCE DAILY BEFORE BREAKFAST 90 tablet 0   levonorgestrel (MIRENA, 52 MG,) 20 MCG/DAY IUD Mirena 20 mcg/24 hours (7 yrs) 52 mg intrauterine device  Take by intrauterine route.     losartan (COZAAR) 100 MG tablet TAKE 1 TABLET BY MOUTH ONCE DAILY . APPOINTMENT REQUIRED FOR FUTURE REFILLS 90 tablet 0   ONETOUCH DELICA LANCETS FINE MISC Use to check blood sugar 3 times daily 100 each 3   oxyCODONE-acetaminophen (PERCOCET/ROXICET) 5-325 MG tablet Take 1 tablet by mouth every 6 (six) hours as needed.     polyethylene glycol (MIRALAX / GLYCOLAX) 17 g packet Take 17 g by mouth once a week.     Probiotic Product (ULTRAFLORA IMMUNE HEALTH PO) Take 1 capsule by mouth daily.     spironolactone (ALDACTONE) 100 MG tablet Take 1 tablet (100 mg total) by mouth daily. 90 tablet 0   No current facility-administered  medications for this visit.    Review of Systems  Constitutional: negative Eyes: negative Ears, nose, mouth, throat, and face: negative Respiratory: negative Cardiovascular: negative Gastrointestinal: positive for abdominal pain Genitourinary:negative Integument/breast: negative Hematologic/lymphatic: negative Musculoskeletal:negative Neurological: negative Behavioral/Psych: negative Endocrine: negative Allergic/Immunologic: negative  Physical Exam  DXI:PJASN, healthy, no distress, well nourished, and well developed SKIN: skin color, texture, turgor are normal, no rashes or significant lesions HEAD: Normocephalic, No masses, lesions, tenderness or abnormalities EYES: normal, PERRLA, EOMI EARS: External ears normal, Canals clear OROPHARYNX:no exudate, no erythema, and lips, buccal mucosa, and tongue normal  NECK: supple, no adenopathy,  no JVD LYMPH:  no palpable lymphadenopathy, no hepatosplenomegaly BREAST:not examined LUNGS: clear to auscultation , and palpation HEART: regular rate & rhythm, no murmurs, and no gallops ABDOMEN:abdomen soft, non-tender, normal bowel sounds, and no masses or organomegaly BACK: Back symmetric, no curvature., No CVA tenderness EXTREMITIES:no joint deformities, effusion, or inflammation, no edema  NEURO: alert & oriented x 3 with fluent speech, no focal motor/sensory deficits  PERFORMANCE STATUS: ECOG 1  LABORATORY DATA: Lab Results  Component Value Date   WBC 7.1 02/24/2022   HGB 14.2 02/24/2022   HCT 43.0 02/24/2022   MCV 87.8 02/24/2022   PLT 393 02/24/2022      Chemistry      Component Value Date/Time   NA 139 02/24/2022 1051   NA 138 08/21/2012 1002   K 3.9 02/24/2022 1051   K 3.5 08/21/2012 1002   CL 103 02/24/2022 1051   CL 102 08/21/2012 1002   CO2 30 02/24/2022 1051   CO2 28 08/21/2012 1002   BUN 23 (H) 02/24/2022 1051   BUN 16.7 08/21/2012 1002   CREATININE 0.94 02/24/2022 1051   CREATININE 0.83 12/03/2013 1112    CREATININE 1.0 08/21/2012 1002      Component Value Date/Time   CALCIUM 9.6 02/24/2022 1051   CALCIUM 8.9 08/21/2012 1002   ALKPHOS 92 02/24/2022 1051   ALKPHOS 60 08/21/2012 1002   AST 16 02/24/2022 1051   AST 10 08/21/2012 1002   ALT 19 02/24/2022 1051   ALT 8 08/21/2012 1002   BILITOT 0.4 02/24/2022 1051   BILITOT 0.22 08/21/2012 1002       RADIOGRAPHIC STUDIES: No results found.  ASSESSMENT: This is a very pleasant 51 years old African-American female with likely reactive thrombocytosis secondary to inflammatory process and the patient also has IUD for history of menorrhagia and fibroids.  Myeloproliferative disorder and essential thrombocythemia could not be completely excluded.   PLAN: I had a lengthy discussion with the patient and her boyfriend today about her current condition and further lab work to confirm her diagnosis. I ordered several studies today including repeat CBC and that was completely normal with normal platelets count of 393,000.  Her total white blood count, hemoglobin and hematocrit were normal.  Comprehensive metabolic panel was normal except for mild hyperglycemia and slightly elevated BUN and total protein.  Iron study was normal.  Ferritin level still pending. I also order molecular studies for JAK2 mutation but this 1 is still pending. I recommended for the patient to continue her routine follow-up visit and evaluation by her primary care physician. If the JAK2 mutation is negative, there would be no need for her to continue with routine follow-up visit with me in I will see her on as-needed basis but if positive I will call the patient with additional recommendation and follow-up evaluation. The patient and her boyfriend are in agreement with the current plan. She was advised to call immediately if she has any other concerning symptoms in the interval. The patient voices understanding of current disease status and treatment options and is in agreement  with the current care plan.  All questions were answered. The patient knows to call the clinic with any problems, questions or concerns. We can certainly see the patient much sooner if necessary.  Thank you so much for allowing me to participate in the care of [place]. I will continue to follow up the patient with you and assist in her care.  The total time spent in the appointment  was 60 minutes.  Disclaimer: This note was dictated with voice recognition software. Similar sounding words can inadvertently be transcribed and may not be corrected upon review.   Eilleen Kempf February 24, 2022, 12:01 PM

## 2022-03-01 LAB — JAK2 (INCLUDING V617F AND EXON 12), MPL,& CALR-NEXT GEN SEQ

## 2022-03-01 NOTE — Telephone Encounter (Signed)
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 I explained to the patient that there is no concerning findings to explain her thrombocytosis as a result of bone marrow abnormality.  I recommended for her to continue her routine follow-up visit and evaluation by her primary care provider and I will be happy to see her in the future if needed.

## 2022-03-05 ENCOUNTER — Other Ambulatory Visit: Payer: Self-pay | Admitting: Endocrinology

## 2022-03-05 ENCOUNTER — Other Ambulatory Visit: Payer: Self-pay | Admitting: Nurse Practitioner

## 2022-03-05 DIAGNOSIS — I10 Essential (primary) hypertension: Secondary | ICD-10-CM

## 2022-03-05 DIAGNOSIS — E1169 Type 2 diabetes mellitus with other specified complication: Secondary | ICD-10-CM

## 2022-03-05 NOTE — Telephone Encounter (Signed)
Chart supports Rx Last OV: 01/2022 Next OV: 07/2022  

## 2022-03-16 ENCOUNTER — Other Ambulatory Visit: Payer: Self-pay | Admitting: Nurse Practitioner

## 2022-03-16 DIAGNOSIS — I10 Essential (primary) hypertension: Secondary | ICD-10-CM

## 2022-03-17 NOTE — Telephone Encounter (Signed)
Chart supports Rx Last OV: 01/2022 Next OV: 07/2022

## 2022-04-09 ENCOUNTER — Other Ambulatory Visit: Payer: Self-pay | Admitting: Endocrinology

## 2022-04-09 DIAGNOSIS — E1169 Type 2 diabetes mellitus with other specified complication: Secondary | ICD-10-CM

## 2022-04-11 ENCOUNTER — Other Ambulatory Visit: Payer: Self-pay | Admitting: Nurse Practitioner

## 2022-04-11 DIAGNOSIS — I10 Essential (primary) hypertension: Secondary | ICD-10-CM

## 2022-04-12 NOTE — Telephone Encounter (Signed)
Chart supports Rx Last OV: 01/2022 Next OV: [id number]

## 2022-05-14 ENCOUNTER — Other Ambulatory Visit: Payer: Self-pay | Admitting: Nurse Practitioner

## 2022-05-14 DIAGNOSIS — I10 Essential (primary) hypertension: Secondary | ICD-10-CM

## 2022-05-14 NOTE — Telephone Encounter (Signed)
Chart supports Rx Last OV: 01/2022 Next OV: 07/2022

## 2022-05-19 LAB — HM MAMMOGRAPHY: HM Mammogram: ABNORMAL — AB (ref 0–4)

## 2022-06-02 ENCOUNTER — Ambulatory Visit: Payer: 59 | Admitting: Family Medicine

## 2022-06-02 VITALS — BP 130/74 | HR 92 | Temp 98.6°F | Ht 62.0 in | Wt 187.8 lb

## 2022-06-02 DIAGNOSIS — M79602 Pain in left arm: Secondary | ICD-10-CM

## 2022-06-02 LAB — COMPREHENSIVE METABOLIC PANEL
ALT: 22 U/L (ref 0–35)
AST: 13 U/L (ref 0–37)
Albumin: 4.1 g/dL (ref 3.5–5.2)
Alkaline Phosphatase: 82 U/L (ref 39–117)
BUN: 24 mg/dL — ABNORMAL HIGH (ref 6–23)
CO2: 29 mEq/L (ref 19–32)
Calcium: 9.4 mg/dL (ref 8.4–10.5)
Chloride: 103 mEq/L (ref 96–112)
Creatinine, Ser: 0.8 mg/dL (ref 0.40–1.20)
GFR: 84.23 mL/min (ref >60.00)
Glucose, Bld: 109 mg/dL — ABNORMAL HIGH (ref 70–99)
Potassium: 3.8 mEq/L (ref 3.5–5.1)
Sodium: 139 mEq/L (ref 135–145)
Total Bilirubin: 0.3 mg/dL (ref 0.2–1.2)
Total Protein: 7.6 g/dL (ref 6.0–8.3)

## 2022-06-02 LAB — MAGNESIUM: Magnesium: 2.1 mg/dL (ref 1.5–2.5)

## 2022-06-02 MED ORDER — BACLOFEN 10 MG PO TABS: 10.0000 mg | ORAL_TABLET | Freq: Three times a day (TID) | ORAL | 0 refills | Status: DC

## 2022-06-02 NOTE — Progress Notes (Signed)
[facility] [address] [place] [address] Dept: [phone] Dept Fax: [phone]  Office Visit  Subjective:    Patient ID: Susan Whitaker, female    DOB: 1970/11/02, 52 y.o..   MRN: [id number]  Chief Complaint  Patient presents with   Acute Visit    C/o having a sharp pain in LT arm off/on x 2 weeks.  Has taken Tylenol Arthritis.     History of Present Illness:  Patient is in today complaining of a 2-week history of intermittent left upper arm pain. She notes it feels like the muscle of the anterior upper arm gets hard and then later relaxes. She has had some associated bilateral cramping in the hands, where the fingers contract and won't relax for a bit. She has had some occasional diaphoresis and nausea during this time. Her arm pain.spasm can occur at rest or with activity. Susan Whitaker has a history of Type 2 diabetes, primary hyperaldosteronism, and hypertension. She is managed on spironolactone, as she has had past hypokalemia issues   Past Medical History: Patient Active Problem List   Diagnosis Date Noted   Reactive thrombocytosis 02/24/2022   Thrombocytosis 01/22/2022   Hypoaldosteronism ([place]) 01/06/2021   FH: breast cancer in first degree relative 01/05/2021   Diabetes mellitus type 2 in obese ([place]) 11/17/2020   Chronic abdominal pain 10/04/2020   S/P radiofrequency ablation operation for arrhythmia 10/03/20 10/04/2020   Volvulus ([place]) 09/15/2020   Chronic idiopathic constipation 09/14/2020   Abdominal bloating 09/14/2020   Obesity (BMI 30-39.9) 09/14/2020   Fibroid, uterine 09/14/2020   Chronic narcotic use 09/14/2020   IUD (intrauterine device) in place 09/14/2020   Abnormal uterine bleeding due to adenomyosis 01/08/2020   Polyp of corpus uteri 01/08/2020   SBO (small bowel obstruction) ([place]) 06/02/2019   History of small bowel obstruction 06/02/2019   Vitamin D deficiency 05/26/2019   Anxiety state  07/14/2012   Primary hyperaldosteronism ([place]) 02/21/2012   Microalbuminuria 02/15/2012   Multiple thyroid nodules 01/04/2012   SVT (supraventricular tachycardia) ([place]) 12/29/2011   Hypokalemia 11/28/2011   Cervical pain (neck) 05/28/2011   Hemorrhoids 02/07/2009   Type 2 diabetes mellitus ([place]) 02/07/2009   ALLERGIC RHINITIS 05/24/2008   Hyperlipidemia associated with type 2 diabetes mellitus ([place]) 04/22/2008   THROMBOCYTOSIS 04/22/2008   URINARY INCONTINENCE 04/22/2008   HTN (hypertension) 01/04/2007   GERD 01/04/2007   Past Surgical History:  Procedure Laterality Date   ABDOMINAL ADHESION SURGERY  06/03/2019   Dr Marlou Starks   ABDOMINAL SURGERY     107 months of age-- unsure of type of surgery   COLONOSCOPY  04/2016   hemorrhoids--normal per pt with Dr Collene Mares   DILATATION & CURETTAGE/HYSTEROSCOPY WITH MYOSURE N/A 02/08/2020   Procedure: DILATATION & CURETTAGE/HYSTEROSCOPY WITH MYOSURE;  Surgeon: Waymon Amato, MD;  Location: [place];  Service: Gynecology;  Laterality: N/A;   HEMORRHOID SURGERY  2005/2006   INTRAUTERINE DEVICE (IUD) INSERTION N/A 02/08/2020   Procedure: INTRAUTERINE DEVICE (IUD) INSERTION UNDER ULTRASOUND GUIDANCE;  Surgeon: Waymon Amato, MD;  Location: [place];  Service: Gynecology;  Laterality: N/A;  Mirena   LAPAROTOMY N/A 06/03/2019   Procedure: EXPLORATORY LAPAROTOMY WITH LYSIS OF ADHESIONS;  Surgeon: Jovita Kussmaul, MD;  Location[facility];  Service: General;  Laterality: N/A;   SVT ABLATION N/A 10/03/2020   Procedure: SVT ABLATION;  Surgeon: Evans Lance, MD;  Location: [facility] [facility];  Service: Cardiovascular;  Laterality: N/A;   UTERINE  FIBROID EMBOLIZATION  2010   at [facility]   Family History  Problem Relation Age of Onset   Cancer Mother 20       breast   Stroke Mother    Vision loss Father        some   Heart disease Father        Rhythm disturbance   Hypertension Sister    Hypertension Brother    Hypertension Brother     Cancer Maternal Grandmother 98       breast   Hypertension Maternal Grandmother    Cancer Other        breast, lung   Hypertension Other    Stroke Other    Cancer Maternal Aunt 50       Breast   Diabetes Neg Hx    Outpatient Medications Prior to Visit  Medication Sig Dispense Refill   acetaminophen (TYLENOL) 325 MG tablet Take 2 tablets (650 mg total) by mouth every 4 (four) hours as needed for headache or mild pain.     amLODipine (NORVASC) 10 MG tablet Take 1 tablet by mouth once daily 30 tablet 0   atorvastatin (LIPITOR) 40 MG tablet Take 1 tablet by mouth once daily 90 tablet 0   glucose blood (ACCU-CHEK GUIDE) test strip Use as instructed to check once daily. 50 each 12   INVOKANA 300 MG TABS tablet TAKE 1 TABLET BY MOUTH ONCE DAILY BEFORE BREAKFAST 90 tablet 0   levonorgestrel (MIRENA, 52 MG,) 20 MCG/DAY IUD Mirena 20 mcg/24 hours (7 yrs) 52 mg intrauterine device  Take by intrauterine route.     losartan (COZAAR) 100 MG tablet TAKE 1 TABLET BY MOUTH ONCE DAILY . APPOINTMENT REQUIRED FOR FUTURE REFILLS 90 tablet 0   ONETOUCH DELICA LANCETS FINE MISC Use to check blood sugar 3 times daily 100 each 3   oxyCODONE-acetaminophen (PERCOCET/ROXICET) 5-325 MG tablet Take 1 tablet by mouth every 6 (six) hours as needed.     polyethylene glycol (MIRALAX / GLYCOLAX) 17 g packet Take 17 g by mouth once a week.     Probiotic Product (ULTRAFLORA IMMUNE HEALTH PO) Take 1 capsule by mouth daily.     spironolactone (ALDACTONE) 100 MG tablet Take 1 tablet (100 mg total) by mouth daily. 90 tablet 0   TRULICITY 3 HB/7.1IR SOPN INJECT 3 MG  AS DIRECTED ONCE A WEEK 4 mL 0   No facility-administered medications prior to visit.   Allergies  Allergen Reactions   Lisinopril Cough    Objective:   Today's Vitals   06/02/22 0835  BP: 130/74  Pulse: 92  Temp: 98.6 F (37 C)  TempSrc: Temporal  SpO2: 98%  Weight: 187 lb 12.8 oz (85.2 kg)  Height: '5\' 2"'$  (1.575 m)   Body mass index is 34.35  kg/m.   General: Well developed, well nourished. No acute distress. Neck: Supple. No lymphadenopathy. No thyromegaly. Lungs: Clear to auscultation bilaterally. No wheezing, rales or rhonchi. CV: RRR without murmurs or rubs. Pulses 2+ bilaterally. Extremities: Full ROM. No joint swelling or tenderness. Normal strength throughout arm. Sensation   normal in UE bilaterally.  Psych: Alert and oriented. Normal mood and affect.  Health Maintenance Due  Topic Date Due   MAMMOGRAM  03/27/2021   PAP SMEAR-Modifier  03/17/2022   HEMOGLOBIN A1C  04/15/2022   OPHTHALMOLOGY EXAM  04/24/2022   EKG: Normal sinus rhythm (rate = 86)    Assessment & Plan:   1. Left arm pain Ms. Molyneux describes what  sounds like muscle spasms and possible tetany of her arm muscles. She has a history of primary hyperaldosteronism identified in 2013. I recommend we check her potassium level today. I will also check calcium and magnesium, though I am less concerned about this. In the meantime, I will try her on some baclofen. I will have her follow-up with Ms. Nche next week to make sure this is resolving.  - EKG 12-Lead - Comprehensive metabolic panel - Magnesium - baclofen (LIORESAL) 10 MG tablet; Take 1 tablet (10 mg total) by mouth 3 (three) times daily.  Dispense: 30 each; Refill: 0   Return in about 1 week (around 06/09/2022) for Reassessment with PCP.   Haydee Salter, MD

## 2022-06-09 ENCOUNTER — Other Ambulatory Visit: Payer: Self-pay | Admitting: Endocrinology

## 2022-06-09 DIAGNOSIS — E669 Obesity, unspecified: Secondary | ICD-10-CM

## 2022-06-10 ENCOUNTER — Ambulatory Visit: Payer: 59 | Admitting: Nurse Practitioner

## 2022-06-24 ENCOUNTER — Other Ambulatory Visit: Payer: Self-pay | Admitting: Nurse Practitioner

## 2022-06-24 DIAGNOSIS — I10 Essential (primary) hypertension: Secondary | ICD-10-CM

## 2022-07-12 ENCOUNTER — Telehealth: Payer: Self-pay | Admitting: Endocrinology

## 2022-07-12 ENCOUNTER — Encounter: Payer: Self-pay | Admitting: Endocrinology

## 2022-07-12 DIAGNOSIS — E669 Obesity, unspecified: Secondary | ICD-10-CM

## 2022-07-30 ENCOUNTER — Encounter: Payer: 59 | Admitting: Nurse Practitioner

## 2022-08-05 ENCOUNTER — Other Ambulatory Visit (INDEPENDENT_AMBULATORY_CARE_PROVIDER_SITE_OTHER): Payer: 59

## 2022-08-05 DIAGNOSIS — E1169 Type 2 diabetes mellitus with other specified complication: Secondary | ICD-10-CM | POA: Diagnosis not present

## 2022-08-05 DIAGNOSIS — E7800 Pure hypercholesterolemia, unspecified: Secondary | ICD-10-CM

## 2022-08-05 DIAGNOSIS — E669 Obesity, unspecified: Secondary | ICD-10-CM | POA: Diagnosis not present

## 2022-08-05 LAB — BASIC METABOLIC PANEL
BUN: 17 mg/dL (ref 6–23)
CO2: 33 mEq/L — ABNORMAL HIGH (ref 19–32)
Calcium: 9.5 mg/dL (ref 8.4–10.5)
Chloride: 100 mEq/L (ref 96–112)
Creatinine, Ser: 0.87 mg/dL (ref 0.40–1.20)
GFR: 76.07 mL/min (ref >60.00)
Glucose, Bld: 110 mg/dL — ABNORMAL HIGH (ref 70–99)
Potassium: 3.5 mEq/L (ref 3.5–5.1)
Sodium: 140 mEq/L (ref 135–145)

## 2022-08-05 LAB — LDL CHOLESTEROL, DIRECT: Direct LDL: 158 mg/dL

## 2022-08-05 LAB — HEMOGLOBIN A1C: Hgb A1c MFr Bld: 6 % (ref 4.6–6.5)

## 2022-08-10 ENCOUNTER — Ambulatory Visit (INDEPENDENT_AMBULATORY_CARE_PROVIDER_SITE_OTHER): Payer: 59 | Admitting: Endocrinology

## 2022-08-10 ENCOUNTER — Encounter: Payer: Self-pay | Admitting: Endocrinology

## 2022-08-10 VITALS — BP 132/78 | HR 87 | Ht 63.0 in | Wt 194.0 lb

## 2022-08-10 DIAGNOSIS — E669 Obesity, unspecified: Secondary | ICD-10-CM | POA: Diagnosis not present

## 2022-08-10 DIAGNOSIS — E042 Nontoxic multinodular goiter: Secondary | ICD-10-CM

## 2022-08-10 DIAGNOSIS — E7800 Pure hypercholesterolemia, unspecified: Secondary | ICD-10-CM | POA: Diagnosis not present

## 2022-08-10 DIAGNOSIS — E1169 Type 2 diabetes mellitus with other specified complication: Secondary | ICD-10-CM

## 2022-08-10 MED ORDER — ATORVASTATIN CALCIUM 40 MG PO TABS: 40.0000 mg | ORAL_TABLET | Freq: Every day | ORAL | 0 refills | Status: DC

## 2022-08-10 MED ORDER — TIRZEPATIDE 2.5 MG/0.5ML ~~LOC~~ SOAJ: 2.5000 mg | SUBCUTANEOUS | 0 refills | Status: DC

## 2022-08-10 MED ORDER — TIRZEPATIDE 5 MG/0.5ML ~~LOC~~ SOAJ
5.0000 mg | SUBCUTANEOUS | 3 refills | Status: DC
  Filled 2022-10-13: qty 2, 28d supply, fill #0
  Filled 2022-11-05: qty 2, 28d supply, fill #1

## 2022-08-10 NOTE — Progress Notes (Signed)
Patient ID: Susan Whitaker, female   DOB: 1970/08/15, 52 y.o.   MRN: [id number]          Reason for Appointment: Follow-up for Type 2 Diabetes     History of Present Illness:          Date of diagnosis of type 2 diabetes mellitus: 2013        Background history:  She was diagnosed to have diabetes when she was having fibroid surgery. A1c in 2013 was 6.9 She was treated with metformin but she says she was not able to take this because of diarrhea and would be irregular with it However her blood sugars stayed about the same until 2015 when they were higher and glipizide added Her Her A1c had gone up to 8.1 in November 2019  Recent history:   Non-insulin hypoglycemic drugs: Invokana [id number] mg daily, Trulicity 3 mg weekly  Current management, levels of control, and problems identified:   A1c is now 6  She has been irregular with her follow-up again and last visit was in 6/23  She has not checked her blood sugars again at home  Morning lab glucose 110, not clear if this was fasting She has not had any Trulicity since January because she did not come in for follow-up as scheduled last year Also she has not done any walking lately because of some foot issues Without taking Trulicity she has gained about 9 pounds She thinks that she is not able to control her portions as well She is not walking foot pain   Side effects from medications have been: Diarrhea from regular metformin   Glucose monitoring: Irregularly    Blood Glucose readings not available   Self-care: The diet that the patient has been following is: tries to limit Portions .     Typical meal intake: Breakfast is  fruit, Activia Yogurt,  For snacks she will have Snack box, chicken breast at dinnertime.  No Sweet drinks                  Dietician visit, most recent: 5/17, has  had diabetes education classes                Weight history: Max weight 230 in past  Wt Readings from Last 3 Encounters:  08/10/22 194 lb  (88 kg)  06/02/22 187 lb 12.8 oz (85.2 kg)  02/24/22 185 lb 1 oz (83.9 kg)    Glycemic control:   Lab Results  Component Value Date   HGBA1C 6.0 08/05/2022   HGBA1C 5.5 10/14/2021   HGBA1C 5.9 04/15/2021   Lab Results  Component Value Date   MICROALBUR 1.2 10/14/2021   LDLCALC 134 (H) 10/14/2021   CREATININE 0.87 08/05/2022   Lab Results  Component Value Date   MICRALBCREAT 0.9 10/14/2021   Lab on 08/05/2022  Component Date Value Ref Range Status   Direct LDL 08/05/2022 158.0  mg/dL Final   Optimal:  <100 mg/dLNear or Above Optimal:  100-129 mg/dLBorderline High:  130-159 mg/dLHigh:  160-189 mg/dLVery High:  >190 mg/dL   Hgb A1c MFr Bld 08/05/2022 6.0  4.6 - 6.5 % Final   Glycemic Control Guidelines for People with Diabetes:Non Diabetic:  <6%Goal of Therapy: <7%Additional Action Suggested:  >8%    Sodium 08/05/2022 140  135 - 145 mEq/L Final   Potassium 08/05/2022 3.5  3.5 - 5.1 mEq/L Final   Chloride 08/05/2022 100  96 - 112 mEq/L Final   CO2 08/05/2022 33 (H)  19 - 32 mEq/L Final   Glucose, Bld 08/05/2022 110 (H)  70 - 99 mg/dL Final   BUN 08/05/2022 17  6 - 23 mg/dL Final   Creatinine, Ser 08/05/2022 0.87  0.40 - 1.20 mg/dL Final   GFR 08/05/2022 76.07  >60.00 mL/min Final   Calculated using the CKD-EPI Creatinine Equation (2021)   Calcium 08/05/2022 9.5  8.4 - 10.5 mg/dL Final     Other active problems are in review of systems    Allergies as of 08/10/2022       Reactions   Lisinopril Cough        Medication List        Accurate as of August 10, 2022 12:02 PM. If you have any questions, ask your nurse or doctor.          Accu-Chek Guide test strip Generic drug: glucose blood Use as instructed to check once daily.   acetaminophen 325 MG tablet Commonly known as: TYLENOL Take 2 tablets (650 mg total) by mouth every 4 (four) hours as needed for headache or mild pain.   amLODipine 10 MG tablet Commonly known as: NORVASC Take 1 tablet by mouth  once daily   atorvastatin 40 MG tablet Commonly known as: LIPITOR Take 1 tablet by mouth once daily   baclofen 10 MG tablet Commonly known as: LIORESAL Take 1 tablet (10 mg total) by mouth 3 (three) times daily.   Invokana 300 MG Tabs tablet Generic drug: canagliflozin TAKE 1 TABLET BY MOUTH ONCE DAILY BEFORE BREAKFAST   losartan 100 MG tablet Commonly known as: COZAAR TAKE 1 TABLET BY MOUTH ONCE DAILY . APPOINTMENT REQUIRED FOR FUTURE REFILLS   Mirena (52 MG) 20 MCG/DAY Iud Generic drug: levonorgestrel Mirena 20 mcg/24 hours (7 yrs) 52 mg intrauterine device  Take by intrauterine route.   OneTouch Delica Lancets Fine Misc Use to check blood sugar 3 times daily   oxyCODONE-acetaminophen 5-325 MG tablet Commonly known as: PERCOCET/ROXICET Take 1 tablet by mouth every 6 (six) hours as needed.   polyethylene glycol 17 g packet Commonly known as: MIRALAX / GLYCOLAX Take 17 g by mouth once a week.   spironolactone 100 MG tablet Commonly known as: ALDACTONE Take 1 tablet (100 mg total) by mouth daily.   Trulicity 3 0000000 Sopn Generic drug: Dulaglutide INJECT 3 MG  AS DIRECTED ONCE A WEEK   ULTRAFLORA IMMUNE HEALTH PO Take 1 capsule by mouth daily.        Allergies:  Allergies  Allergen Reactions   Lisinopril Cough    Past Medical History:  Diagnosis Date   Abdominal pain 10/04/2020   Class 2 obesity due to excess calories with body mass index (BMI) of 35.0 to 35.9 in adult 06/2010   DM type 2 (diabetes mellitus, type 2) ([place])    Gastric ulcer    GERD (gastroesophageal reflux disease)    H/O constipation    H/O hemorrhoids    History of small bowel obstruction 06/02/2019   Hyperlipidemia    Hypertension    Hypokalemia 2019   Menorrhagia    Primary hyperaldosteronism ([place]) 02/21/2012   S/P radiofrequency ablation operation for arrhythmia 10/03/20 10/04/2020   SVT (supraventricular tachycardia)    Noted 11/2011 admission   Thrombocytopenia [facility])      Past Surgical History:  Procedure Laterality Date   ABDOMINAL ADHESION SURGERY  06/03/2019   Dr Marlou Starks   ABDOMINAL SURGERY     76 months of age-- unsure of type of surgery  COLONOSCOPY  04/2016   hemorrhoids--normal per pt with Dr Collene Mares   DILATATION & CURETTAGE/HYSTEROSCOPY WITH MYOSURE N/A 02/08/2020   Procedure: DILATATION & CURETTAGE/HYSTEROSCOPY WITH MYOSURE;  Surgeon: Waymon Amato, MD;  Location: [place];  Service: Gynecology;  Laterality: N/A;   HEMORRHOID SURGERY  2005/2006   INTRAUTERINE DEVICE (IUD) INSERTION N/A 02/08/2020   Procedure: INTRAUTERINE DEVICE (IUD) INSERTION UNDER ULTRASOUND GUIDANCE;  Surgeon: Waymon Amato, MD;  Location: [place];  Service: Gynecology;  Laterality: N/A;  Mirena   LAPAROTOMY N/A 06/03/2019   Procedure: EXPLORATORY LAPAROTOMY WITH LYSIS OF ADHESIONS;  Surgeon: Jovita Kussmaul, MD;  [facility];  Service: General;  Laterality: N/A;   SVT ABLATION N/A 10/03/2020   Procedure: SVT ABLATION;  Surgeon: Evans Lance, MD;  Location: [place] [facility];  Service: Cardiovascular;  Laterality: N/A;   UTERINE FIBROID EMBOLIZATION  2010   at [facility]    Family History  Problem Relation Age of Onset   Cancer Mother 65       breast   Stroke Mother    Vision loss Father        some   Heart disease Father        Rhythm disturbance   Hypertension Sister    Hypertension Brother    Hypertension Brother    Cancer Maternal Grandmother 98       breast   Hypertension Maternal Grandmother    Cancer Other        breast, lung   Hypertension Other    Stroke Other    Cancer Maternal Aunt 50       Breast   Diabetes Neg Hx     Social History:  reports that she has never smoked. She has never used smokeless tobacco. She reports that she does not drink alcohol and does not use drugs.    Review of Systems    Lipid history: LDL 171 At baseline Lipitor 20 mg prescribed by PCP Her last LDL was above target  Despite  reminders she does not take Lipitor regularly and has not taken it daily, last prescription was sent in August    Lab Results  Component Value Date   CHOL 208 (H) 10/14/2021   HDL 62.10 10/14/2021   LDLCALC 134 (H) 10/14/2021   LDLDIRECT 158.0 08/05/2022   TRIG 60.0 10/14/2021   CHOLHDL 3 10/14/2021           Hypertension:   She is on amlodipine, metoprolol and losartan 100 mg prescribed by her PCP  Because of hypokalemia she is also on 100 mg of Aldactone, potassium still low normal Sodium back to normal     BP Readings from Last 3 Encounters:  08/10/22 132/78  06/02/22 130/74  02/24/22 (!) 141/85    Lab Results  Component Value Date   CREATININE 0.87 08/05/2022   BUN 17 08/05/2022   NA 140 08/05/2022   K 3.5 08/05/2022   CL 100 08/05/2022   CO2 33 (H) 08/05/2022     History of goiter with multiple nodules, stable as of the last ultrasound in 2015, largest 1.4 cm nodule in the right side  Lab Results  Component Value Date   TSH 1.04 10/14/2021     Review of Systems  Has history of small goiter  Physical Examination:  BP 132/78   Pulse 87   Ht 5\' 3"  (1.6 m)   Wt 194 lb (88 kg)   SpO2 96%   BMI 34.37 kg/m  ASSESSMENT/PLAN:  Diabetes type 2, non-insulin-dependent  See history of present illness for detailed discussion of current diabetes management, blood sugar patterns and problems identified  Her A1c is slightly higher at 6% without taking Trulicity  She has been treated with Trulicity 3 mg weekly, Invokana 300 mg   Her blood sugars are only slightly higher with not taking her Trulicity since January when she did not have refill However is gaining back weight Prescription sent for new test trips for her to start monitoring at different times including after the meals  She will now try [facility]  Discussed with the patient the action of GIP/GLP-1 drugs, the effects on pancreatic and liver function, effects on brain and stomach with  improved satiety, slowing gastric emptying, improving satiety and reducing liver glucose output.  Discussed the effects on promoting weight loss. Explained possible side effects of MOUNJARO, most commonly nausea that usually improves over time; discussed safety information in package insert.  Demonstrated the medication injection device and injection technique to the patient.  Showed patient the injection sites for his medication To start with 2.5 mg dosage weekly for the first 4 injections and then increase the dose to 5 mg weekly, written prescription given for 5 mg  Patient brochure on Mounjaro and explained use of the available co-pay card    HYPERTENSION: Blood pressure generally controlled Potassium low normal  May consider going up to full tablet of Aldactone     Lipids: LDL is still above target  This is again from not taking her medication regularly and refills of Lipitor sent     There are no Patient Instructions on file for this visit.   Elayne Snare 08/10/2022, 12:02 PM   Note: This office note was prepared with Dragon voice recognition system technology. Any transcriptional errors that result from this process are unintentional.

## 2022-08-10 NOTE — Patient Instructions (Signed)
MOUNJARO 2.5 mg dosage weekly for the first 4 injections and then increase the dose to 5 mg weekly if no nausea

## 2022-08-17 ENCOUNTER — Encounter: Payer: 59 | Admitting: Nurse Practitioner

## 2022-08-31 ENCOUNTER — Other Ambulatory Visit: Payer: Self-pay | Admitting: Endocrinology

## 2022-08-31 DIAGNOSIS — E1169 Type 2 diabetes mellitus with other specified complication: Secondary | ICD-10-CM

## 2022-09-27 ENCOUNTER — Telehealth: Payer: Self-pay | Admitting: Nurse Practitioner

## 2022-09-27 ENCOUNTER — Encounter: Payer: 59 | Admitting: Nurse Practitioner

## 2022-09-27 NOTE — Telephone Encounter (Signed)
Pt was a no show for a cpe on 09/27/22 with Claris Gower, I sent a no show letter.

## 2022-10-08 NOTE — Telephone Encounter (Signed)
1st no show, fee waived, letter sent 

## 2022-10-13 ENCOUNTER — Other Ambulatory Visit (HOSPITAL_BASED_OUTPATIENT_CLINIC_OR_DEPARTMENT_OTHER): Payer: Self-pay

## 2022-11-04 ENCOUNTER — Other Ambulatory Visit (HOSPITAL_COMMUNITY): Payer: Self-pay

## 2022-11-05 ENCOUNTER — Other Ambulatory Visit (HOSPITAL_BASED_OUTPATIENT_CLINIC_OR_DEPARTMENT_OTHER): Payer: Self-pay

## 2022-12-02 ENCOUNTER — Other Ambulatory Visit: Payer: 59

## 2022-12-04 ENCOUNTER — Other Ambulatory Visit: Payer: Self-pay | Admitting: Nurse Practitioner

## 2022-12-04 DIAGNOSIS — I10 Essential (primary) hypertension: Secondary | ICD-10-CM

## 2022-12-10 ENCOUNTER — Other Ambulatory Visit (HOSPITAL_BASED_OUTPATIENT_CLINIC_OR_DEPARTMENT_OTHER): Payer: Self-pay

## 2022-12-10 ENCOUNTER — Encounter: Payer: Self-pay | Admitting: Endocrinology

## 2022-12-10 ENCOUNTER — Ambulatory Visit (INDEPENDENT_AMBULATORY_CARE_PROVIDER_SITE_OTHER): Payer: 59 | Admitting: Endocrinology

## 2022-12-10 VITALS — BP 110/70 | HR 78 | Ht 63.0 in | Wt 189.4 lb

## 2022-12-10 DIAGNOSIS — E1169 Type 2 diabetes mellitus with other specified complication: Secondary | ICD-10-CM | POA: Diagnosis not present

## 2022-12-10 DIAGNOSIS — E669 Obesity, unspecified: Secondary | ICD-10-CM | POA: Diagnosis not present

## 2022-12-10 DIAGNOSIS — E042 Nontoxic multinodular goiter: Secondary | ICD-10-CM | POA: Diagnosis not present

## 2022-12-10 DIAGNOSIS — Z6833 Body mass index (BMI) 33.0-33.9, adult: Secondary | ICD-10-CM | POA: Diagnosis not present

## 2022-12-10 DIAGNOSIS — E7800 Pure hypercholesterolemia, unspecified: Secondary | ICD-10-CM

## 2022-12-10 DIAGNOSIS — Z7985 Long-term (current) use of injectable non-insulin antidiabetic drugs: Secondary | ICD-10-CM

## 2022-12-10 DIAGNOSIS — Z8639 Personal history of other endocrine, nutritional and metabolic disease: Secondary | ICD-10-CM

## 2022-12-10 DIAGNOSIS — E1122 Type 2 diabetes mellitus with diabetic chronic kidney disease: Secondary | ICD-10-CM

## 2022-12-10 DIAGNOSIS — I10 Essential (primary) hypertension: Secondary | ICD-10-CM

## 2022-12-10 LAB — POCT GLYCOSYLATED HEMOGLOBIN (HGB A1C): Hemoglobin A1C: 6 % — AB (ref 4.0–5.6)

## 2022-12-10 LAB — BASIC METABOLIC PANEL
BUN: 19 mg/dL (ref 6–23)
CO2: 27 mEq/L (ref 19–32)
Calcium: 9.5 mg/dL (ref 8.4–10.5)
Chloride: 103 mEq/L (ref 96–112)
Creatinine, Ser: 1.02 mg/dL (ref 0.40–1.20)
GFR: 62.7 mL/min (ref >60.00)
Glucose, Bld: 100 mg/dL — ABNORMAL HIGH (ref 70–99)
Potassium: 4 mEq/L (ref 3.5–5.1)
Sodium: 139 mEq/L (ref 135–145)

## 2022-12-10 LAB — LIPID PANEL
Cholesterol: 158 mg/dL (ref 0–200)
HDL: 48.5 mg/dL (ref >39.00)
LDL Cholesterol: 95 mg/dL (ref 0–99)
NonHDL: 109.82
Total CHOL/HDL Ratio: 3
Triglycerides: 76 mg/dL (ref 0.0–149.0)
VLDL: 15.2 mg/dL (ref 0.0–40.0)

## 2022-12-10 LAB — MICROALBUMIN / CREATININE URINE RATIO
Creatinine,U: 123.2 mg/dL
Microalb Creat Ratio: 0.6 mg/g (ref 0.0–30.0)
Microalb, Ur: <0.7 mg/dL (ref 0.0–1.9)

## 2022-12-10 LAB — TSH: TSH: 1.6 u[IU]/mL (ref 0.35–5.50)

## 2022-12-10 MED ORDER — TIRZEPATIDE 5 MG/0.5ML ~~LOC~~ SOAJ
5.0000 mg | SUBCUTANEOUS | 0 refills | Status: DC
  Filled 2022-12-10: qty 2, 28d supply, fill #0

## 2022-12-10 MED ORDER — TIRZEPATIDE 7.5 MG/0.5ML ~~LOC~~ SOAJ
7.5000 mg | SUBCUTANEOUS | 3 refills | Status: DC
  Filled 2023-01-04: qty 2, 28d supply, fill #0
  Filled 2023-02-02: qty 2, 28d supply, fill #1
  Filled 2023-03-01: qty 2, 28d supply, fill #2
  Filled 2023-03-29: qty 2, 28d supply, fill #3

## 2022-12-10 NOTE — Progress Notes (Signed)
Patient ID: Susan Whitaker, female   DOB: 1970-06-14, 52 y.o.   MRN: [id number]          Reason for Appointment: Follow-up for Type 2 Diabetes     History of Present Illness:          Date of diagnosis of type 2 diabetes mellitus: 2013        Background history:  She was diagnosed to have diabetes when she was having fibroid surgery. A1c in 2013 was 6.9 She was treated with metformin but she says she was not able to take this because of diarrhea and would be irregular with it However her blood sugars stayed about the same until 2015 when they were higher and glipizide added Her Her A1c had gone up to 8.1 in November 2019  Recent history:   Non-insulin hypoglycemic drugs: Invokana 300 mg daily, Mounjaro 5mg   Current management, levels of control, and problems identified:   A1c is 6  She has not checked her blood sugars again at home for quite some time She had been started on Mounjaro in March and her blood sugars appear to be stable Also has lost 5 pounds She ran out of her medication 2 weeks ago and did not get a refill No recent labs available She thinks that she is usually able to control her portions with Mounjaro compared to Trulicity She is walking daily recently with resolution of her foot problem   Side effects from medications have been: Diarrhea from regular metformin   Glucose monitoring: Irregularly    Blood Glucose readings not available   Self-care: The diet that the patient has been following is: tries to limit Portions .     Typical meal intake: Breakfast is  fruit, Activia Yogurt,  For snacks she will have Snack box, chicken breast at dinnertime.  No Sweet drinks                  Dietician visit, most recent: 5/17, has  had diabetes education classes                Weight history: Max weight 230 in past  Wt Readings from Last 3 Encounters:  12/10/22 189 lb 6.4 oz (85.9 kg)  08/10/22 194 lb (88 kg)  06/02/22 187 lb 12.8 oz (85.2 kg)     Glycemic control:   Lab Results  Component Value Date   HGBA1C 6.0 (A) 12/10/2022   HGBA1C 6.0 08/05/2022   HGBA1C 5.5 10/14/2021   Lab Results  Component Value Date   MICROALBUR <0.7 12/10/2022   LDLCALC 95 12/10/2022   CREATININE 1.02 12/10/2022   Lab Results  Component Value Date   MICRALBCREAT 0.6 12/10/2022   Office Visit on 12/10/2022  Component Date Value Ref Range Status   Hemoglobin A1C 12/10/2022 6.0 (A)  4.0 - 5.6 % Final   Microalb, Ur 12/10/2022 <0.7  0.0 - 1.9 mg/dL Final   Creatinine,U 40/98/1191 123.2  mg/dL Final   Microalb Creat Ratio 12/10/2022 0.6  0.0 - 30.0 mg/g Final   Cholesterol 12/10/2022 158  0 - 200 mg/dL Final   ATP III Classification       Desirable:  < 200 mg/dL               Borderline High:  200 - 239 mg/dL          High:  > = 478 mg/dL   Triglycerides 29/56/2130 76.0  0.0 - 149.0 mg/dL Final   Normal:  <  150 mg/dLBorderline High:  150 - 199 mg/dL   HDL 16/02/9603 54.09  >39.00 mg/dL Final   VLDL 81/19/1478 15.2  0.0 - 40.0 mg/dL Final   LDL Cholesterol 12/10/2022 95  0 - 99 mg/dL Final   Total CHOL/HDL Ratio 12/10/2022 3   Final                  Men          Women1/2 Average Risk     3.4          3.3Average Risk          5.0          4.42X Average Risk          9.6          7.13X Average Risk          15.0          11.0                       NonHDL 12/10/2022 109.82   Final   NOTE:  Non-HDL goal should be 30 mg/dL higher than patient's LDL goal (i.e. LDL goal of < 70 mg/dL, would have non-HDL goal of < 100 mg/dL)   TSH 29/56/2130 8.65  0.35 - 5.50 uIU/mL Final   Sodium 12/10/2022 139  135 - 145 mEq/L Final   Potassium 12/10/2022 4.0  3.5 - 5.1 mEq/L Final   Chloride 12/10/2022 103  96 - 112 mEq/L Final   CO2 12/10/2022 27  19 - 32 mEq/L Final   Glucose, Bld 12/10/2022 100 (H)  70 - 99 mg/dL Final   BUN 78/46/9629 19  6 - 23 mg/dL Final   Creatinine, Ser 12/10/2022 1.02  0.40 - 1.20 mg/dL Final   GFR 52/84/1324 62.70  >60.00 mL/min  Final   Calculated using the CKD-EPI Creatinine Equation (2021)   Calcium 12/10/2022 9.5  8.4 - 10.5 mg/dL Final     Other active problems are in review of systems    Allergies as of 12/10/2022       Reactions   Lisinopril Cough        Medication List        Accurate as of December 10, 2022 11:59 PM. If you have any questions, ask your nurse or doctor.          STOP taking these medications    tirzepatide 2.5 MG/0.5ML Pen Commonly known as: MOUNJARO Replaced by: tirzepatide 7.5 MG/0.5ML Pen You also have another medication with the same name that you need to continue taking as instructed. Stopped by: Reather Littler       TAKE these medications    Accu-Chek Guide test strip Generic drug: glucose blood Use as instructed to check once daily.   acetaminophen 325 MG tablet Commonly known as: TYLENOL Take 2 tablets (650 mg total) by mouth every 4 (four) hours as needed for headache or mild pain.   amLODipine 10 MG tablet Commonly known as: NORVASC Take 1 tablet by mouth once daily   atorvastatin 40 MG tablet Commonly known as: LIPITOR Take 1 tablet (40 mg total) by mouth daily.   baclofen 10 MG tablet Commonly known as: LIORESAL Take 1 tablet (10 mg total) by mouth 3 (three) times daily.   Invokana 300 MG Tabs tablet Generic drug: canagliflozin TAKE 1 TABLET BY MOUTH ONCE DAILY BEFORE BREAKFAST   losartan 100 MG tablet Commonly known as: COZAAR TAKE 1 TABLET  BY MOUTH ONCE DAILY . APPOINTMENT REQUIRED FOR FUTURE REFILLS   Mirena (52 MG) 20 MCG/DAY Iud Generic drug: levonorgestrel Mirena 20 mcg/24 hours (7 yrs) 52 mg intrauterine device  Take by intrauterine route.   OneTouch Delica Lancets Fine Misc Use to check blood sugar 3 times daily   oxyCODONE-acetaminophen 5-325 MG tablet Commonly known as: PERCOCET/ROXICET Take 1 tablet by mouth every 6 (six) hours as needed.   polyethylene glycol 17 g packet Commonly known as: MIRALAX / GLYCOLAX Take 17 g by  mouth once a week.   spironolactone 100 MG tablet Commonly known as: ALDACTONE Take 1 tablet (100 mg total) by mouth daily.   tirzepatide 7.5 MG/0.5ML Pen Commonly known as: MOUNJARO Inject 7.5 mg into the skin once a week. What changed: You were already taking a medication with the same name, and this prescription was added. Make sure you understand how and when to take each. Replaces: tirzepatide 2.5 MG/0.5ML Pen Changed by: Claudean Kinds 5 MG/0.5ML Pen Generic drug: tirzepatide Inject 5 mg into the skin once a week. What changed:  Another medication with the same name was added. Make sure you understand how and when to take each. Another medication with the same name was removed. Continue taking this medication, and follow the directions you see here. Changed by: Lorane Gell IMMUNE HEALTH PO Take 1 capsule by mouth daily.        Allergies:  Allergies  Allergen Reactions   Lisinopril Cough    Past Medical History:  Diagnosis Date   Abdominal pain 10/04/2020   Class 2 obesity due to excess calories with body mass index (BMI) of 35.0 to 35.9 in adult 06/2010   DM type 2 (diabetes mellitus, type 2) ([facility])    Gastric ulcer    GERD (gastroesophageal reflux disease)    H/O constipation    H/O hemorrhoids    History of small bowel obstruction 06/02/2019   Hyperlipidemia    Hypertension    Hypokalemia 2019   Menorrhagia    Primary hyperaldosteronism ([facility]) 02/21/2012   S/P radiofrequency ablation operation for arrhythmia 10/03/20 10/04/2020   SVT (supraventricular tachycardia)    Noted 11/2011 admission   Thrombocytopenia [facility])     Past Surgical History:  Procedure Laterality Date   ABDOMINAL ADHESION SURGERY  06/03/2019   Dr Carolynne Edouard   ABDOMINAL SURGERY     53 months of age-- unsure of type of surgery   COLONOSCOPY  04/2016   hemorrhoids--normal per pt with Dr Loreta Ave   DILATATION & CURETTAGE/HYSTEROSCOPY WITH MYOSURE N/A 02/08/2020   Procedure:  DILATATION & CURETTAGE/HYSTEROSCOPY WITH MYOSURE;  Surgeon: Hoover Browns, MD;  Location: [facility];  Service: Gynecology;  Laterality: N/A;   HEMORRHOID SURGERY  2005/2006   INTRAUTERINE DEVICE (IUD) INSERTION N/A 02/08/2020   Procedure: INTRAUTERINE DEVICE (IUD) INSERTION UNDER ULTRASOUND GUIDANCE;  Surgeon: Hoover Browns, MD;  Location: [facility];  Service: Gynecology;  Laterality: N/A;  Mirena   LAPAROTOMY N/A 06/03/2019   Procedure: EXPLORATORY LAPAROTOMY WITH LYSIS OF ADHESIONS;  Surgeon: Griselda Miner, MD;  [facility];  Service: General;  Laterality: N/A;   SVT ABLATION N/A 10/03/2020   Procedure: SVT ABLATION;  Surgeon: Marinus Maw, MD;  [facility];  Service: Cardiovascular;  Laterality: N/A;   UTERINE FIBROID EMBOLIZATION  2010   at [facility]    Family History  Problem Relation Age of Onset   Cancer Mother 35  breast   Stroke Mother    Vision loss Father        some   Heart disease Father        Rhythm disturbance   Hypertension Sister    Hypertension Brother    Hypertension Brother    Cancer Maternal Grandmother 61       breast   Hypertension Maternal Grandmother    Cancer Other        breast, lung   Hypertension Other    Stroke Other    Cancer Maternal Aunt 50       Breast   Diabetes Neg Hx     Social History:  reports that she has never smoked. She has never used smokeless tobacco. She reports that she does not drink alcohol and does not use drugs.    Review of Systems    Lipid history: LDL 171 At baseline Lipitor 40 mg prescribed by PCP Her last LDL was above target  Despite reminders she does not take Lipitor regularly and previous LDL was high   Lab Results  Component Value Date   CHOL 158 12/10/2022   HDL 48.50 12/10/2022   LDLCALC 95 12/10/2022   LDLDIRECT 158.0 08/05/2022   TRIG 76.0 12/10/2022   CHOLHDL 3 12/10/2022           Hypertension:   She is on amlodipine, metoprolol and  losartan 100 mg prescribed by her PCP  Because of hypokalemia she is also on 100 mg of Aldactone No recent labs available     BP Readings from Last 3 Encounters:  12/10/22 110/70  08/10/22 132/78  06/02/22 130/74    Lab Results  Component Value Date   CREATININE 1.02 12/10/2022   BUN 19 12/10/2022   NA 139 12/10/2022   K 4.0 12/10/2022   CL 103 12/10/2022   CO2 27 12/10/2022     History of goiter with multiple nodules, stable as of the last ultrasound in 2015, largest 1.4 cm nodule in the right side  Lab Results  Component Value Date   TSH 1.60 12/10/2022     Review of Systems  Has history of small goiter  Physical Examination:  BP 110/70   Pulse 78   Ht 5\' 3"  (1.6 m)   Wt 189 lb 6.4 oz (85.9 kg)   SpO2 98%   BMI 33.55 kg/m      ASSESSMENT/PLAN:  Diabetes type 2, non-insulin-dependent  See history of present illness for detailed discussion of current diabetes management, blood sugar patterns and problems identified  Her A1c is again 6%  She has been treated with Mounjaro 5 mg weekly, Invokana 300 mg   Her blood sugars are still well-controlled with A1c 6% Not monitoring at home However started exercise and is doing better with mild weight loss and portion control with switching to [facility]  She will now try 7.5mg  Mounjaro weekly However since she has not taken any for the last 2 weeks she will start back on 5 mg for 4 weeks To check blood sugars periodically at home   HYPERTENSION: Blood pressure controlled   Lipids: Will recheck labs today, likely needs to make sure she takes her Lipitor more regularly for optimal control   History of goiter: Will check TSH also  There are no Patient Instructions on file for this visit.   Reather Littler 12/12/2022, 11:37 AM   Note: This office note was prepared with Dragon voice recognition system technology. Any transcriptional errors that result from this  process are unintentional.  Addendum: All labs  normal

## 2022-12-31 ENCOUNTER — Other Ambulatory Visit: Payer: Self-pay | Admitting: Nurse Practitioner

## 2022-12-31 DIAGNOSIS — I10 Essential (primary) hypertension: Secondary | ICD-10-CM

## 2023-01-04 ENCOUNTER — Other Ambulatory Visit (HOSPITAL_BASED_OUTPATIENT_CLINIC_OR_DEPARTMENT_OTHER): Payer: Self-pay

## 2023-02-02 ENCOUNTER — Other Ambulatory Visit: Payer: Self-pay | Admitting: Nurse Practitioner

## 2023-02-02 ENCOUNTER — Other Ambulatory Visit (HOSPITAL_BASED_OUTPATIENT_CLINIC_OR_DEPARTMENT_OTHER): Payer: Self-pay

## 2023-02-02 DIAGNOSIS — I10 Essential (primary) hypertension: Secondary | ICD-10-CM

## 2023-02-02 MED ORDER — LOSARTAN POTASSIUM 100 MG PO TABS
100.0000 mg | ORAL_TABLET | Freq: Every day | ORAL | 0 refills | Status: DC
Start: 2023-02-02 — End: 2023-05-13
  Filled 2023-02-02: qty 30, 30d supply, fill #0
  Filled 2023-03-01: qty 30, 30d supply, fill #1

## 2023-02-02 MED ORDER — SPIRONOLACTONE 100 MG PO TABS
100.0000 mg | ORAL_TABLET | Freq: Every day | ORAL | 0 refills | Status: DC
  Filled 2023-02-02: qty 30, 30d supply, fill #0
  Filled 2023-03-01: qty 30, 30d supply, fill #1

## 2023-02-03 ENCOUNTER — Other Ambulatory Visit: Payer: Self-pay

## 2023-02-03 ENCOUNTER — Other Ambulatory Visit (HOSPITAL_BASED_OUTPATIENT_CLINIC_OR_DEPARTMENT_OTHER): Payer: Self-pay

## 2023-03-10 ENCOUNTER — Other Ambulatory Visit (HOSPITAL_BASED_OUTPATIENT_CLINIC_OR_DEPARTMENT_OTHER): Payer: Self-pay

## 2023-03-10 MED ORDER — NITROFURANTOIN MONOHYD MACRO 100 MG PO CAPS
100.0000 mg | ORAL_CAPSULE | Freq: Two times a day (BID) | ORAL | 0 refills | Status: DC
  Filled 2023-03-10: qty 20, 10d supply, fill #0

## 2023-03-16 ENCOUNTER — Ambulatory Visit (INDEPENDENT_AMBULATORY_CARE_PROVIDER_SITE_OTHER): Payer: 59 | Admitting: Nurse Practitioner

## 2023-03-16 ENCOUNTER — Encounter: Payer: Self-pay | Admitting: Nurse Practitioner

## 2023-03-16 ENCOUNTER — Other Ambulatory Visit (HOSPITAL_BASED_OUTPATIENT_CLINIC_OR_DEPARTMENT_OTHER): Payer: Self-pay

## 2023-03-16 VITALS — BP 118/71 | HR 88 | Temp 98.3°F | Resp 18 | Ht 63.0 in | Wt 185.0 lb

## 2023-03-16 DIAGNOSIS — Z1231 Encounter for screening mammogram for malignant neoplasm of breast: Secondary | ICD-10-CM

## 2023-03-16 DIAGNOSIS — Z23 Encounter for immunization: Secondary | ICD-10-CM

## 2023-03-16 DIAGNOSIS — E1169 Type 2 diabetes mellitus with other specified complication: Secondary | ICD-10-CM | POA: Diagnosis not present

## 2023-03-16 DIAGNOSIS — Z0001 Encounter for general adult medical examination with abnormal findings: Secondary | ICD-10-CM

## 2023-03-16 DIAGNOSIS — K5904 Chronic idiopathic constipation: Secondary | ICD-10-CM

## 2023-03-16 DIAGNOSIS — D473 Essential (hemorrhagic) thrombocythemia: Secondary | ICD-10-CM | POA: Diagnosis not present

## 2023-03-16 DIAGNOSIS — Z13220 Encounter for screening for lipoid disorders: Secondary | ICD-10-CM | POA: Diagnosis not present

## 2023-03-16 DIAGNOSIS — E785 Hyperlipidemia, unspecified: Secondary | ICD-10-CM

## 2023-03-16 DIAGNOSIS — R300 Dysuria: Secondary | ICD-10-CM

## 2023-03-16 DIAGNOSIS — E559 Vitamin D deficiency, unspecified: Secondary | ICD-10-CM

## 2023-03-16 DIAGNOSIS — E1122 Type 2 diabetes mellitus with diabetic chronic kidney disease: Secondary | ICD-10-CM | POA: Diagnosis not present

## 2023-03-16 DIAGNOSIS — I10 Essential (primary) hypertension: Secondary | ICD-10-CM

## 2023-03-16 LAB — CBC WITH DIFFERENTIAL/PLATELET
Basophils Absolute: 0 10*3/uL (ref 0.0–0.1)
Basophils Relative: 0.4 % (ref 0.0–3.0)
Eosinophils Absolute: 0.1 10*3/uL (ref 0.0–0.7)
Eosinophils Relative: 1.6 % (ref 0.0–5.0)
HCT: 39.3 % (ref 36.0–46.0)
Hemoglobin: 12.9 g/dL (ref 12.0–15.0)
Lymphocytes Relative: 26.6 % (ref 12.0–46.0)
Lymphs Abs: 2.1 10*3/uL (ref 0.7–4.0)
MCHC: 32.9 g/dL (ref 30.0–36.0)
MCV: 90 fL (ref 78.0–100.0)
Monocytes Absolute: 0.7 10*3/uL (ref 0.1–1.0)
Monocytes Relative: 8.6 % (ref 3.0–12.0)
Neutro Abs: 5 10*3/uL (ref 1.4–7.7)
Neutrophils Relative %: 62.8 % (ref 43.0–77.0)
Platelets: 446 10*3/uL — ABNORMAL HIGH (ref 150.0–400.0)
RBC: 4.36 Mil/uL (ref 3.87–5.11)
RDW: 13 % (ref 11.5–15.5)
WBC: 7.9 10*3/uL (ref 4.0–10.5)

## 2023-03-16 LAB — COMPREHENSIVE METABOLIC PANEL
ALT: 15 U/L (ref 0–35)
AST: 14 U/L (ref 0–37)
Albumin: 4.2 g/dL (ref 3.5–5.2)
Alkaline Phosphatase: 68 U/L (ref 39–117)
BUN: 20 mg/dL (ref 6–23)
CO2: 28 meq/L (ref 19–32)
Calcium: 9.4 mg/dL (ref 8.4–10.5)
Chloride: 101 meq/L (ref 96–112)
Creatinine, Ser: 1.04 mg/dL (ref 0.40–1.20)
GFR: 61.14 mL/min (ref >60.00)
Glucose, Bld: 91 mg/dL (ref 70–99)
Potassium: 4 meq/L (ref 3.5–5.1)
Sodium: 138 meq/L (ref 135–145)
Total Bilirubin: 0.4 mg/dL (ref 0.2–1.2)
Total Protein: 7.8 g/dL (ref 6.0–8.3)

## 2023-03-16 LAB — LIPID PANEL
Cholesterol: 171 mg/dL (ref 0–200)
HDL: 45.7 mg/dL (ref >39.00)
LDL Cholesterol: 113 mg/dL — ABNORMAL HIGH (ref 0–99)
NonHDL: 125.3
Total CHOL/HDL Ratio: 4
Triglycerides: 64 mg/dL (ref 0.0–149.0)
VLDL: 12.8 mg/dL (ref 0.0–40.0)

## 2023-03-16 LAB — HEMOGLOBIN A1C: Hgb A1c MFr Bld: 5.9 % (ref 4.6–6.5)

## 2023-03-16 MED ORDER — AMLODIPINE BESYLATE 10 MG PO TABS
10.0000 mg | ORAL_TABLET | Freq: Every day | ORAL | 3 refills | Status: DC
  Filled 2023-03-16 – 2023-03-29 (×2): qty 30, 30d supply, fill #0
  Filled 2023-06-20: qty 30, 30d supply, fill #1
  Filled 2023-08-11: qty 30, 30d supply, fill #2
  Filled 2023-09-13 – 2023-09-14 (×2): qty 30, 30d supply, fill #3
  Filled 2023-11-01: qty 30, 30d supply, fill #4
  Filled 2023-11-29: qty 30, 30d supply, fill #5
  Filled 2024-01-19: qty 30, 30d supply, fill #6
  Filled 2024-02-22: qty 30, 30d supply, fill #7

## 2023-03-16 NOTE — Assessment & Plan Note (Signed)
BP at goal with amlodipine and losartan BP Readings from Last 3 Encounters:  03/16/23 118/71  12/10/22 110/70  08/10/22 132/78   Maintain above med dose Repeat CMP

## 2023-03-16 NOTE — Assessment & Plan Note (Signed)
" >>  ASSESSMENT AND PLAN FOR CHRONIC IDIOPATHIC CONSTIPATION WRITTEN ON 03/16/2023 10:09 AM BY Kutler Vanvranken LUM, NP  Requested colonoscopy report from Dr. Kristie Gilbert requested to transfer care from Dr. Kristie to [facility] "

## 2023-03-16 NOTE — Assessment & Plan Note (Signed)
Controlled with invokanna and mounjaro Under the care of endocrinology: Dr. Lucianne Muss. Advised to schedule DIABETES eye exam No neuropathy and nephropathy. Current use of atorvastatin Reports persistent dysuria after completion of macrobid Repeat UA, Hgb A1c and CMP

## 2023-03-16 NOTE — Progress Notes (Signed)
Complete physical exam  Patient: Susan Whitaker   DOB: 10/09/1970   52 y.o. Female  MRN: [id number] Visit Date: 03/16/2023  Subjective:    Chief Complaint  Patient presents with   Annual Exam    PT is here for annual exam. Patient is due to eye exam scheduled for next month and mammogram due. RX refill request for Invokana 300 mg.    Susan Whitaker is a 52 y.o. female who presents today for a complete physical exam. She reports consuming a low fat diet.  Walking daily  She generally feels well. She reports sleeping well. She does not have additional problems to discuss today.  Vision:No Dental:Yes STD Screen:No  BP Readings from Last 3 Encounters:  03/16/23 118/71  12/10/22 110/70  08/10/22 132/78   Wt Readings from Last 3 Encounters:  03/16/23 185 lb (83.9 kg)  12/10/22 189 lb 6.4 oz (85.9 kg)  08/10/22 194 lb (88 kg)    Most recent fall risk assessment:    03/16/2023    9:13 AM  Fall Risk   Falls in the past year? 0  Number falls in past yr: 0  Injury with Fall? 0  Risk for fall due to : No Fall Risks  Follow up Falls evaluation completed     Depression screen:Yes - No Depression Most recent depression screenings:    03/16/2023    9:14 AM 07/21/2021    1:54 PM  PHQ 2/9 Scores  PHQ - 2 Score 1 1  PHQ- 9 Score 4 1    HPI  HTN (hypertension) BP at goal with amlodipine and losartan BP Readings from Last 3 Encounters:  03/16/23 118/71  12/10/22 110/70  08/10/22 132/78   Maintain above med dose Repeat CMP  Chronic idiopathic constipation Requested colonoscopy report from Dr. Johnnette Litter requested to transfer care from Dr. Loreta Ave to [facility]  Type 2 diabetes mellitus (HCC) Controlled with invokanna and mounjaro Under the care of endocrinology: Dr. Lucianne Muss. Advised to schedule DIABETES eye exam No neuropathy and nephropathy. Current use of atorvastatin Reports persistent dysuria after completion of macrobid Repeat UA, Hgb A1c and CMP   Hyperlipidemia  associated with type 2 diabetes mellitus (HCC) Repeat lipid panel and CMP Maintain atorvastatin dose  Essential thrombocythemia (HCC) Repeat CBC   Past Medical History:  Diagnosis Date   Abdominal bloating 09/14/2020   Abdominal pain 10/04/2020   Class 2 obesity due to excess calories with body mass index (BMI) of 35.0 to 35.9 in adult 06/2010   DM type 2 (diabetes mellitus, type 2) (HCC)    Gastric ulcer    GERD (gastroesophageal reflux disease)    H/O constipation    H/O hemorrhoids    History of small bowel obstruction 06/02/2019   Hyperlipidemia    Hypertension    Hypokalemia 2019   Menorrhagia    Microalbuminuria 02/15/2012   Primary hyperaldosteronism (HCC) 02/21/2012   S/P radiofrequency ablation operation for arrhythmia 10/03/20 10/04/2020   SVT (supraventricular tachycardia) (HCC)    Noted 11/2011 admission   SVT (supraventricular tachycardia) (HCC) 12/29/2011   Thrombocytopenia (HCC)    Type 2 diabetes mellitus with obesity (HCC) 11/17/2020   IMO SNOMED Dx Update Oct 2024     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   Past Surgical History:  Procedure Laterality Date   ABDOMINAL ADHESION SURGERY  06/03/2019   Dr Carolynne Edouard   ABDOMINAL SURGERY     35 months of age-- unsure of type of surgery   COLONOSCOPY  04/2016  hemorrhoids--normal per pt with Dr Loreta Ave   DILATATION & CURETTAGE/HYSTEROSCOPY WITH MYOSURE N/A 02/08/2020   Procedure: DILATATION & CURETTAGE/HYSTEROSCOPY WITH MYOSURE;  Surgeon: Hoover Browns, MD;  Location: [facility] [place];  Service: Gynecology;  Laterality: N/A;   HEMORRHOID SURGERY  2005/2006   INTRAUTERINE DEVICE (IUD) INSERTION N/A 02/08/2020   Procedure: INTRAUTERINE DEVICE (IUD) INSERTION UNDER ULTRASOUND GUIDANCE;  Surgeon: Hoover Browns, MD;  Location: [facility];  Service: Gynecology;  Laterality: N/A;  Mirena   LAPAROTOMY N/A 06/03/2019   Procedure: EXPLORATORY LAPAROTOMY WITH LYSIS OF ADHESIONS;  Surgeon: Griselda Miner, MD;  Location: [facility];  Service: General;  Laterality: N/A;   SVT ABLATION N/A 10/03/2020   Procedure: SVT ABLATION;  Surgeon: Marinus Maw, MD;  Location: [facility];  Service: Cardiovascular;  Laterality: N/A;   UTERINE FIBROID EMBOLIZATION  2010   at [facility]   Social History   Socioeconomic History   Marital status: Single    Spouse name: Not on file   Number of children: 2   Years of education: Not on file   Highest education level: Not on file  Occupational History   Occupation: YOUTH EMPLOYMENT    Employer: [employer]  Tobacco Use   Smoking status: Never   Smokeless tobacco: Never  Vaping Use   Vaping status: Never Used  Substance and Sexual Activity   Alcohol use: No   Drug use: No   Sexual activity: Not on file  Other Topics Concern   Not on file  Social History Narrative   Works at [employer]         Social Determinants of Corporate investment banker Strain: Not on file  Food Insecurity: Not on file  Transportation Needs: Not on file  Physical Activity: Not on file  Stress: Not on file  Social Connections: Not on file  Intimate Partner Violence: Not on file   Family Status  Relation Name Status   Mother  Deceased   Father  Alive   Sister [name] Alive   Sister tara Alive   Brother charles Alive   Brother Web designer Alive   MGM  Deceased   Other  (Not Specified)   Mat Aunt  Alive   Neg Hx  (Not Specified)  No partnership data on file   Family History  Problem Relation Age of Onset   Cancer Mother 26       breast   Stroke Mother    Vision loss Father        some   Heart disease Father        Rhythm disturbance   Hypertension Sister    Hypertension Brother    Hypertension Brother    Cancer Maternal Grandmother 5       breast   Hypertension Maternal Grandmother    Cancer Other        breast, lung   Hypertension Other    Stroke Other    Cancer Maternal Aunt 50       Breast   Diabetes Neg Hx    Allergies  Allergen Reactions   Lisinopril  Cough    Patient Care Team: Izeyah [name], Bonna Gains, NP as PCP - General (Internal Medicine) Pricilla Riffle, MD as PCP - Cardiology (Cardiology) Marinus Maw, MD as PCP - Electrophysiology (Cardiology) Annie Sable, MD as Consulting Physician (Nephrology) Reather Littler, MD (Inactive) as Consulting Physician (Endocrinology) Marinus Maw, MD as Consulting Physician (Cardiology) Hoover Browns, MD as  Consulting Physician (Obstetrics and Gynecology) Griselda Miner, MD as Consulting Physician (General Surgery) Shon Millet, MD as Consulting Physician (Ophthalmology)   Medications: Outpatient Medications Prior to Visit  Medication Sig Note   acetaminophen (TYLENOL) 325 MG tablet Take 2 tablets (650 mg total) by mouth every 4 (four) hours as needed for headache or mild pain.    atorvastatin (LIPITOR) 40 MG tablet Take 1 tablet (40 mg total) by mouth daily.    baclofen (LIORESAL) 10 MG tablet Take 1 tablet (10 mg total) by mouth 3 (three) times daily.    glucose blood (ACCU-CHEK GUIDE) test strip Use as instructed to check once daily.    levonorgestrel (MIRENA, 52 MG,) 20 MCG/DAY IUD Mirena 20 mcg/24 hours (7 yrs) 52 mg intrauterine device  Take by intrauterine route.    losartan (COZAAR) 100 MG tablet Take 1 tablet (100 mg total) by mouth daily.    nitrofurantoin, macrocrystal-monohydrate, (MACROBID) 100 MG capsule Take 1 capsule (100 mg total) by mouth 2 (two) times daily.    ONETOUCH DELICA LANCETS FINE MISC Use to check blood sugar 3 times daily    oxyCODONE-acetaminophen (PERCOCET/ROXICET) 5-325 MG tablet Take 1 tablet by mouth every 6 (six) hours as needed.    polyethylene glycol (MIRALAX / GLYCOLAX) 17 g packet Take 17 g by mouth once a week.    Probiotic Product (ULTRAFLORA IMMUNE HEALTH PO) Take 1 capsule by mouth daily.    spironolactone (ALDACTONE) 100 MG tablet Take 1 tablet (100 mg total) by mouth daily.    tirzepatide [facility]) 5 MG/0.5ML Pen Inject 5 mg into the skin once a  week.    tirzepatide (MOUNJARO) 7.5 MG/0.5ML Pen Inject 7.5 mg into the skin once a week.    [DISCONTINUED] amLODipine (NORVASC) 10 MG tablet Take 1 tablet by mouth once daily    canagliflozin (INVOKANA) 300 MG TABS tablet TAKE 1 TABLET BY MOUTH ONCE DAILY BEFORE BREAKFAST (Patient not taking: Reported on 03/16/2023) 03/16/2023: Refills are needed    No facility-administered medications prior to visit.    Review of Systems  Constitutional:  Negative for activity change, appetite change and unexpected weight change.  Respiratory: Negative.    Cardiovascular: Negative.   Gastrointestinal: Negative.   Endocrine: Negative for cold intolerance and heat intolerance.  Genitourinary: Negative.   Musculoskeletal: Negative.   Skin: Negative.   Neurological: Negative.   Hematological: Negative.   Psychiatric/Behavioral:  Negative for behavioral problems, decreased concentration, dysphoric mood, hallucinations, self-injury, sleep disturbance and suicidal ideas. The patient is not nervous/anxious.         Objective:  BP 118/71 (BP Location: Left Arm, Patient Position: Sitting, Cuff Size: Normal)   Pulse 88   Temp 98.3 F (36.8 C) (Temporal)   Resp 18   Ht 5\' 3"  (1.6 m)   Wt 185 lb (83.9 kg)   SpO2 100%   BMI 32.77 kg/m     Physical Exam Vitals and nursing note reviewed.  Constitutional:      General: She is not in acute distress. HENT:     Right Ear: Tympanic membrane, ear canal and external ear normal.     Left Ear: Tympanic membrane, ear canal and external ear normal.     Nose: Nose normal.  Eyes:     Extraocular Movements: Extraocular movements intact.     Conjunctiva/sclera: Conjunctivae normal.     Pupils: Pupils are equal, round, and reactive to light.  Neck:     Thyroid: No thyroid mass, thyromegaly or thyroid  tenderness.  Cardiovascular:     Rate and Rhythm: Normal rate and regular rhythm.     Pulses: Normal pulses.     Heart sounds: Normal heart sounds.  Pulmonary:      Effort: Pulmonary effort is normal.     Breath sounds: Normal breath sounds.  Abdominal:     General: Bowel sounds are normal.     Palpations: Abdomen is soft.  Musculoskeletal:        General: Normal range of motion.     Cervical back: Normal range of motion and neck supple.     Right lower leg: No edema.     Left lower leg: No edema.  Lymphadenopathy:     Cervical: No cervical adenopathy.  Skin:    General: Skin is warm and dry.  Neurological:     Mental Status: She is alert and oriented to person, place, and time.     Cranial Nerves: No cranial nerve deficit.  Psychiatric:        Mood and Affect: Mood normal.        Behavior: Behavior normal.        Thought Content: Thought content normal.      No results found for any visits on 03/16/23.    Assessment & Plan:    Routine Health Maintenance and Physical Exam  Immunization History  Administered Date(s) Administered   Fluad Quad(high Dose 65+) 01/22/2022   Fluad Trivalent(High Dose 65+) 03/16/2023   Influenza Split 02/14/2012   Influenza,inj,Quad PF,6+ Mos 03/04/2014, 01/29/2015, 03/24/2016, 01/24/2017, 04/11/2019, 04/28/2020   Influenza-Unspecified 02/14/2013, 04/28/2020, 02/14/2021   PFIZER(Purple Top)SARS-COV-2 Vaccination 10/10/2019, 11/02/2019, 12/28/2020   Pneumococcal Polysaccharide-23 02/14/2012, 07/25/2017   Tdap 07/14/2012, 03/16/2023   Zoster Recombinant(Shingrix) 04/11/2019, 04/28/2020   Zoster, Live 04/28/2020    Health Maintenance  Topic Date Due   MAMMOGRAM  03/27/2021   OPHTHALMOLOGY EXAM  04/24/2022   FOOT EXAM  04/25/2023 (Originally 07/08/2022)   COVID-19 Vaccine (4 - 2023-24 season) 08/09/2023 (Originally 01/16/2023)   HEMOGLOBIN A1C  06/12/2023   Diabetic kidney evaluation - eGFR measurement  12/10/2023   Diabetic kidney evaluation - Urine ACR  12/10/2023   Cervical Cancer Screening (HPV/Pap Cotest)  03/17/2024   Colonoscopy  05/18/2027   DTaP/Tdap/Td (3 - Td or Tdap) 03/15/2033    INFLUENZA VACCINE  Completed   Hepatitis C Screening  Completed   HIV Screening  Completed   Zoster Vaccines- Shingrix  Completed   HPV VACCINES  Aged Out    Discussed health benefits of physical activity, and encouraged her to engage in regular exercise appropriate for her age and condition.  Problem List Items Addressed This Visit     Chronic idiopathic constipation    Requested colonoscopy report from Dr. Johnnette Litter requested to transfer care from Dr. Loreta Ave to [facility]      Relevant Orders   Ambulatory referral to Gastroenterology   Essential thrombocythemia [facility])    Repeat CBC      Relevant Orders   CBC with Differential/Platelet   HTN (hypertension)    BP at goal with amlodipine and losartan BP Readings from Last 3 Encounters:  03/16/23 118/71  12/10/22 110/70  08/10/22 132/78   Maintain above med dose Repeat CMP      Relevant Medications   amLODipine (NORVASC) 10 MG tablet   Hyperlipidemia associated with type 2 diabetes mellitus (HCC)    Repeat lipid panel and CMP Maintain atorvastatin dose      Relevant Medications   amLODipine (NORVASC) 10  MG tablet   Other Relevant Orders   Lipid panel   Type 2 diabetes mellitus (HCC)    Controlled with invokanna and mounjaro Under the care of endocrinology: Dr. Lucianne Muss. Advised to schedule DIABETES eye exam No neuropathy and nephropathy. Current use of atorvastatin Reports persistent dysuria after completion of macrobid Repeat UA, Hgb A1c and CMP       Relevant Orders   Hemoglobin A1c   Vitamin D deficiency   Other Visit Diagnoses     Encounter for preventative adult health care exam with abnormal findings    -  Primary   Relevant Orders   Comprehensive metabolic panel   Encounter for lipid screening for cardiovascular disease       Immunization due       Relevant Orders   Flu Vaccine Trivalent High Dose (Fluad) (Completed)   Tdap vaccine greater than or equal to 7yo IM (Completed)   Dysuria        Relevant Orders   Urinalysis w microscopic + reflex cultur   Breast cancer screening by mammogram       Relevant Orders   MM 3D SCREENING MAMMOGRAM BILATERAL BREAST      Return in about 6 months (around 09/14/2023) for HTN, hyperlipidemia (fasting).     Alysia Penna, NP

## 2023-03-16 NOTE — Assessment & Plan Note (Signed)
Requested colonoscopy report from Dr. Johnnette Litter requested to transfer care from Dr. Loreta Ave to [facility]

## 2023-03-16 NOTE — Assessment & Plan Note (Signed)
Repeat lipid panel and CMP Maintain atorvastatin dose

## 2023-03-16 NOTE — Patient Instructions (Addendum)
Go to lab Continue Heart healthy diet and daily exercise. Maintain current medications. Request invokanna refill from Dr. Lucianne Muss Schedule appointment for mammogram and eye exam Sign medical release to get colonoscopy reports from Dr. Loreta Ave  Preventive Care 65-54 Years Old, Female Preventive care refers to lifestyle choices and visits with your health care provider that can promote health and wellness. Preventive care visits are also called wellness exams. What can I expect for my preventive care visit? Counseling Your health care provider may ask you questions about your: Medical history, including: Past medical problems. Family medical history. Pregnancy history. Current health, including: Menstrual cycle. Method of birth control. Emotional well-being. Home life and relationship well-being. Sexual activity and sexual health. Lifestyle, including: Alcohol, nicotine or tobacco, and drug use. Access to firearms. Diet, exercise, and sleep habits. Work and work Astronomer. Sunscreen use. Safety issues such as seatbelt and bike helmet use. Physical exam Your health care provider will check your: Height and weight. These may be used to calculate your BMI (body mass index). BMI is a measurement that tells if you are at a healthy weight. Waist circumference. This measures the distance around your waistline. This measurement also tells if you are at a healthy weight and may help predict your risk of certain diseases, such as type 2 diabetes and high blood pressure. Heart rate and blood pressure. Body temperature. Skin for abnormal spots. What immunizations do I need?  Vaccines are usually given at various ages, according to a schedule. Your health care provider will recommend vaccines for you based on your age, medical history, and lifestyle or other factors, such as travel or where you work. What tests do I need? Screening Your health care provider may recommend screening tests for  certain conditions. This may include: Lipid and cholesterol levels. Diabetes screening. This is done by checking your blood sugar (glucose) after you have not eaten for a while (fasting). Pelvic exam and Pap test. Hepatitis B test. Hepatitis C test. HIV (human immunodeficiency virus) test. STI (sexually transmitted infection) testing, if you are at risk. Lung cancer screening. Colorectal cancer screening. Mammogram. Talk with your health care provider about when you should start having regular mammograms. This may depend on whether you have a family history of breast cancer. BRCA-related cancer screening. This may be done if you have a family history of breast, ovarian, tubal, or peritoneal cancers. Bone density scan. This is done to screen for osteoporosis. Talk with your health care provider about your test results, treatment options, and if necessary, the need for more tests. Follow these instructions at home: Eating and drinking  Eat a diet that includes fresh fruits and vegetables, whole grains, lean protein, and low-fat dairy products. Take vitamin and mineral supplements as recommended by your health care provider. Do not drink alcohol if: Your health care provider tells you not to drink. You are pregnant, may be pregnant, or are planning to become pregnant. If you drink alcohol: Limit how much you have to 0-1 drink a day. Know how much alcohol is in your drink. In the [place] one drink equals one 12 oz bottle of beer (355 mL), one 5 oz glass of wine (148 mL), or one 1 oz glass of hard liquor (44 mL). Lifestyle Brush your teeth every morning and night with fluoride toothpaste. Floss one time each day. Exercise for at least 30 minutes 5 or more days each week. Do not use any products that contain nicotine or tobacco. These products include cigarettes, chewing tobacco,  and vaping devices, such as e-cigarettes. If you need help quitting, ask your health care provider. Do not use  drugs. If you are sexually active, practice safe sex. Use a condom or other form of protection to prevent STIs. If you do not wish to become pregnant, use a form of birth control. If you plan to become pregnant, see your health care provider for a prepregnancy visit. Take aspirin only as told by your health care provider. Make sure that you understand how much to take and what form to take. Work with your health care provider to find out whether it is safe and beneficial for you to take aspirin daily. Find healthy ways to manage stress, such as: Meditation, yoga, or listening to music. Journaling. Talking to a trusted person. Spending time with friends and family. Minimize exposure to UV radiation to reduce your risk of skin cancer. Safety Always wear your seat belt while driving or riding in a vehicle. Do not drive: If you have been drinking alcohol. Do not ride with someone who has been drinking. When you are tired or distracted. While texting. If you have been using any mind-altering substances or drugs. Wear a helmet and other protective equipment during sports activities. If you have firearms in your house, make sure you follow all gun safety procedures. Seek help if you have been physically or sexually abused. What's next? Visit your health care provider once a year for an annual wellness visit. Ask your health care provider how often you should have your eyes and teeth checked. Stay up to date on all vaccines. This information is not intended to replace advice given to you by your health care provider. Make sure you discuss any questions you have with your health care provider. Document Revised: 10/29/2020 Document Reviewed: 10/29/2020 Elsevier Patient Education  2024 [employer].

## 2023-03-16 NOTE — Assessment & Plan Note (Signed)
Repeat CBC

## 2023-03-17 LAB — URINALYSIS W MICROSCOPIC + REFLEX CULTURE
Bacteria, UA: NONE SEEN /[HPF]
Bilirubin Urine: NEGATIVE
Glucose, UA: NEGATIVE
Hgb urine dipstick: NEGATIVE
Hyaline Cast: NONE SEEN /[LPF]
Ketones, ur: NEGATIVE
Leukocyte Esterase: NEGATIVE
Nitrites, Initial: NEGATIVE
Protein, ur: NEGATIVE
RBC / HPF: NONE SEEN /[HPF] (ref 0–2)
Specific Gravity, Urine: 1.01 (ref 1.001–1.035)
Squamous Epithelial / HPF: NONE SEEN /[HPF] (ref <=5)
WBC, UA: NONE SEEN /[HPF] (ref 0–5)
pH: 7 (ref 5.0–8.0)

## 2023-03-17 LAB — NO CULTURE INDICATED

## 2023-03-23 ENCOUNTER — Other Ambulatory Visit: Payer: 59

## 2023-03-29 ENCOUNTER — Other Ambulatory Visit (HOSPITAL_BASED_OUTPATIENT_CLINIC_OR_DEPARTMENT_OTHER): Payer: Self-pay

## 2023-03-30 ENCOUNTER — Ambulatory Visit: Payer: 59 | Admitting: Endocrinology

## 2023-05-04 ENCOUNTER — Other Ambulatory Visit: Payer: Self-pay

## 2023-05-04 DIAGNOSIS — E669 Obesity, unspecified: Secondary | ICD-10-CM

## 2023-05-06 ENCOUNTER — Other Ambulatory Visit: Payer: 59

## 2023-05-07 LAB — BASIC METABOLIC PANEL
BUN: 20 mg/dL (ref 7–25)
CO2: 30 mmol/L (ref 20–32)
Calcium: 10 mg/dL (ref 8.6–10.4)
Chloride: 103 mmol/L (ref 98–110)
Creat: 1.03 mg/dL (ref 0.50–1.03)
Glucose, Bld: 99 mg/dL (ref 65–99)
Potassium: 4.5 mmol/L (ref 3.5–5.3)
Sodium: 142 mmol/L (ref 135–146)

## 2023-05-07 LAB — HEMOGLOBIN A1C
Hgb A1c MFr Bld: 5.9 %{Hb} — ABNORMAL HIGH (ref <5.7)
Mean Plasma Glucose: 123 mg/dL
eAG (mmol/L): 6.8 mmol/L

## 2023-05-09 ENCOUNTER — Encounter: Payer: Self-pay | Admitting: Nurse Practitioner

## 2023-05-09 LAB — HM DIABETES EYE EXAM

## 2023-05-09 NOTE — Telephone Encounter (Signed)
 Care team updated and letter sent for eye exam notes.

## 2023-05-13 ENCOUNTER — Encounter: Payer: Self-pay | Admitting: Endocrinology

## 2023-05-13 ENCOUNTER — Other Ambulatory Visit: Payer: Self-pay | Admitting: Nurse Practitioner

## 2023-05-13 DIAGNOSIS — I10 Essential (primary) hypertension: Secondary | ICD-10-CM

## 2023-05-18 DIAGNOSIS — C801 Malignant (primary) neoplasm, unspecified: Secondary | ICD-10-CM

## 2023-05-18 HISTORY — DX: Malignant (primary) neoplasm, unspecified: C80.1

## 2023-05-20 ENCOUNTER — Other Ambulatory Visit (HOSPITAL_BASED_OUTPATIENT_CLINIC_OR_DEPARTMENT_OTHER): Payer: Self-pay

## 2023-05-20 ENCOUNTER — Encounter: Payer: Self-pay | Admitting: Endocrinology

## 2023-05-20 ENCOUNTER — Other Ambulatory Visit: Payer: Self-pay

## 2023-05-20 ENCOUNTER — Ambulatory Visit (INDEPENDENT_AMBULATORY_CARE_PROVIDER_SITE_OTHER): Payer: 59 | Admitting: Endocrinology

## 2023-05-20 DIAGNOSIS — Z7984 Long term (current) use of oral hypoglycemic drugs: Secondary | ICD-10-CM | POA: Diagnosis not present

## 2023-05-20 DIAGNOSIS — E119 Type 2 diabetes mellitus without complications: Secondary | ICD-10-CM | POA: Diagnosis not present

## 2023-05-20 DIAGNOSIS — Z7985 Long-term (current) use of injectable non-insulin antidiabetic drugs: Secondary | ICD-10-CM | POA: Diagnosis not present

## 2023-05-20 MED ORDER — FREESTYLE LIBRE 3 PLUS SENSOR MISC
1.0000 | 3 refills | Status: DC
  Filled 2023-05-20 – 2023-08-11 (×2): qty 2, 30d supply, fill #0
  Filled 2023-09-13: qty 6, 84d supply, fill #0

## 2023-05-20 MED ORDER — BLOOD GLUCOSE METER KIT
1.0000 | PACK | Freq: Three times a day (TID) | 0 refills | Status: DC
  Filled 2023-05-20: qty 1, 31d supply, fill #0

## 2023-05-20 MED ORDER — DEXCOM G7 SENSOR MISC
1.0000 | 0 refills | Status: DC
  Filled 2023-05-20 – 2023-09-13 (×3): qty 3, 30d supply, fill #0

## 2023-05-20 MED ORDER — TIRZEPATIDE 7.5 MG/0.5ML ~~LOC~~ SOAJ: 7.5000 mg | SUBCUTANEOUS | 3 refills | Status: DC

## 2023-05-20 MED ORDER — BLOOD GLUCOSE TEST VI STRP
1.0000 | ORAL_STRIP | Freq: Every day | 3 refills | Status: DC
  Filled 2023-05-20: qty 50, 30d supply, fill #0
  Filled 2023-06-20: qty 50, 30d supply, fill #1
  Filled 2023-08-11: qty 50, 30d supply, fill #2
  Filled 2023-09-13: qty 50, 30d supply, fill #3

## 2023-05-20 MED ORDER — ONETOUCH DELICA PLUS LANCET33G MISC
1.0000 | Freq: Every day | 3 refills | Status: AC
  Filled 2023-05-20: qty 100, 31d supply, fill #0

## 2023-05-20 MED ORDER — LANCING DEVICE MISC
1.0000 | Freq: Three times a day (TID) | 0 refills | Status: AC
  Filled 2023-05-20: qty 1, 31d supply, fill #0

## 2023-05-20 MED ORDER — CANAGLIFLOZIN 300 MG PO TABS
300.0000 mg | ORAL_TABLET | Freq: Every day | ORAL | 4 refills | Status: DC
  Filled 2023-05-20: qty 90, 90d supply, fill #0
  Filled 2023-06-20: qty 30, 30d supply, fill #0

## 2023-05-20 MED ORDER — ATORVASTATIN CALCIUM 40 MG PO TABS
40.0000 mg | ORAL_TABLET | Freq: Every day | ORAL | 3 refills | Status: DC
  Filled 2023-05-20: qty 30, 30d supply, fill #0
  Filled 2023-06-20: qty 30, 30d supply, fill #1
  Filled 2023-09-13 – 2023-09-14 (×2): qty 30, 30d supply, fill #2

## 2023-05-20 NOTE — Progress Notes (Signed)
 Outpatient Endocrinology Note Donley Harland, MD   Patient's Name: Susan Whitaker    DOB: [birth year]    MRN: [id number]                                                    REASON OF VISIT: Follow up for type 2 diabetes mellitus  PCP: Nche, Roselie Rockford, NP  HISTORY OF PRESENT ILLNESS:   Susan Whitaker is a 53 y.o. old female with past medical history listed below, is here for follow up for type 2 diabetes mellitus.   Pertinent Diabetes History: Patient was previously seen by Dr. Von and was last time seen in July 2024.  Patient was diagnosed with type 2 diabetes mellitus in 2013.  Patient was diagnosed with diabetes when she was having workup for fibroid surgery, baseline A1c was 6.9%.  She has controlled type 2 diabetes mellitus.  Chronic Diabetes Complications : Retinopathy: no. Last ophthalmology exam was done on 04/2023, following with ophthalmology regularly.  Nephropathy: no, on ACE/ARB /losartan . Peripheral neuropathy: no Coronary artery disease: no Stroke: no  Relevant comorbidities and cardiovascular risk factors: Obesity: yes Body mass index is 32.88 kg/m.  Hypertension: Yes  Hyperlipidemia : Yes, on statin   Current / Home Diabetic regimen includes:  Mounjaro  7.5 mg weekly. Invokana  300 mg daily.  Prior diabetic medications: Metformin  is stopped due to diarrhea.  She had taken glipizide  in the past.  Glycemic data:   She has not been checking blood sugar at home.  Hypoglycemia: Patient has no hypoglycemic episodes. Patient has hypoglycemia awareness.  Factors modifying glucose control: 1.  Diabetic diet assessment: 3 meals a day.  2.  Staying active or exercising: Walking.  3.  Medication compliance: compliant all of the time.  Interval history  Diabetes regimen as noted above.  She has been tolerating Mounjaro  well.  Denies GI issues.  She has no numbness and tingling of the feet.  No vision problem.  She recently had diabetic eye exam with no retinopathy.   No other complaints today.  REVIEW OF SYSTEMS As per history of present illness.   PAST MEDICAL HISTORY: Past Medical History:  Diagnosis Date   Abdominal bloating 09/14/2020   Abdominal pain 10/04/2020   Class 2 obesity due to excess calories with body mass index (BMI) of 35.0 to 35.9 in adult 06/2010   DM type 2 (diabetes mellitus, type 2) (HCC)    Gastric ulcer    GERD (gastroesophageal reflux disease)    H/O constipation    H/O hemorrhoids    History of small bowel obstruction 06/02/2019   Hyperlipidemia    Hypertension    Hypokalemia 2019   Menorrhagia    Microalbuminuria 02/15/2012   Primary hyperaldosteronism (HCC) 02/21/2012   S/P radiofrequency ablation operation for arrhythmia 10/03/20 10/04/2020   SVT (supraventricular tachycardia) (HCC)    Noted 11/2011 admission   SVT (supraventricular tachycardia) (HCC) 12/29/2011   Thrombocytopenia (HCC)    Type 2 diabetes mellitus with obesity (HCC) 11/17/2020   IMO SNOMED Dx Update Oct 2024     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    PAST SURGICAL HISTORY: Past Surgical History:  Procedure Laterality Date   ABDOMINAL ADHESION SURGERY  06/03/2019   Dr Curvin   ABDOMINAL SURGERY     8 months of age-- unsure of type of surgery  COLONOSCOPY  04/2016   hemorrhoids--normal per pt with Dr Kristie   DILATATION & CURETTAGE/HYSTEROSCOPY WITH MYOSURE N/A 02/08/2020   Procedure: DILATATION & CURETTAGE/HYSTEROSCOPY WITH MYOSURE;  Surgeon: Gloriann Chick, MD;  Location: [facility];  Service: Gynecology;  Laterality: N/A;   HEMORRHOID SURGERY  2005/2006   INTRAUTERINE DEVICE (IUD) INSERTION N/A 02/08/2020   Procedure: INTRAUTERINE DEVICE (IUD) INSERTION UNDER ULTRASOUND GUIDANCE;  Surgeon: Gloriann Chick, MD;  Location: [facility];  Service: Gynecology;  Laterality: N/A;  Mirena    LAPAROTOMY N/A 06/03/2019   Procedure: EXPLORATORY LAPAROTOMY WITH LYSIS OF ADHESIONS;  Surgeon: Curvin Deward MOULD, MD;  Location: [facility];  Service:  General;  Laterality: N/A;   SVT ABLATION N/A 10/03/2020   Procedure: SVT ABLATION;  Surgeon: Waddell Danelle ORN, MD;  Location: [facility];  Service: Cardiovascular;  Laterality: N/A;   UTERINE FIBROID EMBOLIZATION  2010   at [facility]    ALLERGIES: Allergies  Allergen Reactions   Lisinopril Cough    FAMILY HISTORY:  Family History  Problem Relation Age of Onset   Cancer Mother 40       breast   Stroke Mother    Vision loss Father        some   Heart disease Father        Rhythm disturbance   Hypertension Sister    Hypertension Brother    Hypertension Brother    Cancer Maternal Grandmother 60       breast   Hypertension Maternal Grandmother    Cancer Other        breast, lung   Hypertension Other    Stroke Other    Cancer Maternal Aunt 50       Breast   Diabetes Neg Hx     SOCIAL HISTORY: Social History   Socioeconomic History   Marital status: Single    Spouse name: Not on file   Number of children: 2   Years of education: Not on file   Highest education level: Not on file  Occupational History   Occupation: YOUTH EMPLOYMENT    Employer: [employer]  Tobacco Use   Smoking status: Never   Smokeless tobacco: Never  Vaping Use   Vaping status: Never Used  Substance and Sexual Activity   Alcohol  use: No   Drug use: No   Sexual activity: Not on file  Other Topics Concern   Not on file  Social History Narrative   Works at [employer]         Social Drivers of Corporate Investment Banker Strain: Not on file  Food Insecurity: Not on file  Transportation Needs: Not on file  Physical Activity: Not on file  Stress: Not on file  Social Connections: Not on file    MEDICATIONS:  Current Outpatient Medications  Medication Sig Dispense Refill   acetaminophen  (TYLENOL ) 325 MG tablet Take 2 tablets (650 mg total) by mouth every 4 (four) hours as needed for headache or mild pain.     amLODipine  (NORVASC ) 10 MG tablet Take 1 tablet (10 mg total) by  mouth daily. 90 tablet 3   baclofen  (LIORESAL ) 10 MG tablet Take 1 tablet (10 mg total) by mouth 3 (three) times daily. 30 each 0   blood glucose meter kit and supplies Use in the morning, afternoon, and at bedtime. 1 each 0   Continuous Glucose Sensor (DEXCOM G7 SENSOR) MISC 1 Device by Does not apply route continuous. 9 each 0  Continuous Glucose Sensor (FREESTYLE LIBRE 3 PLUS SENSOR) MISC 1 each by Does not apply route continuous. Change every 15 days. 6 each 3   glucose blood (ACCU-CHEK GUIDE) test strip Use as instructed to check once daily. 50 each 12   Glucose Blood (BLOOD GLUCOSE TEST STRIPS) STRP Use 1 each daily to test blood glucose levels. 100 each 3   Lancet Device MISC Use in the morning, at noon, and at bedtime. 1 each 0   Lancets Misc. MISC 1 each by Does not apply route daily. May substitute to any manufacturer covered by patient's insurance. 100 each 3   levonorgestrel  (MIRENA , 52 MG,) 20 MCG/DAY IUD Mirena  20 mcg/24 hours (7 yrs) 52 mg intrauterine device  Take by intrauterine route.     losartan  (COZAAR ) 100 MG tablet Take 1 tablet (100 mg total) by mouth daily. 90 tablet 1   nitrofurantoin , macrocrystal-monohydrate, (MACROBID ) 100 MG capsule Take 1 capsule (100 mg total) by mouth 2 (two) times daily. 20 capsule 0   ONETOUCH DELICA LANCETS FINE MISC Use to check blood sugar 3 times daily 100 each 3   oxyCODONE -acetaminophen  (PERCOCET/ROXICET) 5-325 MG tablet Take 1 tablet by mouth every 6 (six) hours as needed.     polyethylene glycol (MIRALAX  / GLYCOLAX ) 17 g packet Take 17 g by mouth once a week.     Probiotic Product (ULTRAFLORA IMMUNE HEALTH PO) Take 1 capsule by mouth daily.     spironolactone  (ALDACTONE ) 100 MG tablet Take 1 tablet by mouth once daily 90 tablet 1   atorvastatin  (LIPITOR) 40 MG tablet Take 1 tablet (40 mg total) by mouth daily. 90 tablet 3   canagliflozin  (INVOKANA ) 300 MG TABS tablet Take 1 tablet (300 mg total) by mouth daily before breakfast. 90 tablet  4   tirzepatide  (MOUNJARO ) 7.5 MG/0.5ML Pen Inject 7.5 mg into the skin once a week. 6 mL 3   No current facility-administered medications for this visit.    PHYSICAL EXAM: Vitals:   05/20/23 1102  BP: 112/70  Pulse: 81  Resp: 20  SpO2: 98%  Weight: 185 lb 9.6 oz (84.2 kg)  Height: 5' 3 (1.6 m)   Body mass index is 32.88 kg/m.  Wt Readings from Last 3 Encounters:  05/20/23 185 lb 9.6 oz (84.2 kg)  03/16/23 185 lb (83.9 kg)  12/10/22 189 lb 6.4 oz (85.9 kg)    General: Well developed, well nourished female in no apparent distress.  HEENT: AT/[place], no external lesions.  Eyes: Conjunctiva clear and no icterus. Neck: Neck supple  Lungs: Respirations not labored Neurologic: Alert, oriented, normal speech Extremities / Skin: Dry. No sores or rashes noted. Psychiatric: Does not appear depressed or anxious  Diabetic Foot Exam - Simple   Simple Foot Form Diabetic Foot exam was performed with the following findings: Yes 05/20/2023 11:39 AM  Visual Inspection No deformities, no ulcerations, no other skin breakdown bilaterally: Yes Sensation Testing Intact to touch and monofilament testing bilaterally: Yes Pulse Check Posterior Tibialis and Dorsalis pulse intact bilaterally: Yes Comments    LABS Reviewed Lab Results  Component Value Date   HGBA1C 5.9 (H) 05/06/2023   HGBA1C 5.9 03/16/2023   HGBA1C 6.0 (A) 12/10/2022   Lab Results  Component Value Date   FRUCTOSAMINE 241 05/03/2018   FRUCTOSAMINE 273 08/01/2017   FRUCTOSAMINE 275 10/16/2015   Lab Results  Component Value Date   CHOL 171 03/16/2023   HDL 45.70 03/16/2023   LDLCALC 113 (H) 03/16/2023   LDLDIRECT 158.0 08/05/2022  TRIG 64.0 03/16/2023   CHOLHDL 4 03/16/2023   Lab Results  Component Value Date   MICRALBCREAT 0.6 12/10/2022   MICRALBCREAT 0.9 10/14/2021   Lab Results  Component Value Date   CREATININE 1.03 05/06/2023   Lab Results  Component Value Date   GFR 61.14 03/16/2023    ASSESSMENT /  PLAN  1. Controlled type 2 diabetes mellitus without complication, without long-term current use of insulin  (HCC)     Diabetes Mellitus type 2, complicated by no known complications. - Diabetic status / severity: Controlled.  Lab Results  Component Value Date   HGBA1C 5.9 (H) 05/06/2023    - Hemoglobin A1c goal : <6.5%  Discussed about increasing dose of Mounjaro  to have weight loss benefit.  Patient wants to stay on the current dose.  - Medications: No change.  I) continue Mounjaro  7.5 mg weekly. II) continue Invokana  300 mg daily.  - Home glucose testing: Few times a week at different times of the day.  She is interested in CGM, prescription for Dexcom G7 and freestyle libre 3+ to the pharmacy.  Test supplies glucometer prescribed. - Discussed/ Gave Hypoglycemia treatment plan.  # Consult : not required at this time.   # Annual urine for microalbuminuria/ creatinine ratio, no microalbuminuria currently, continue ACE/ARB /losartan . Last  Lab Results  Component Value Date   MICRALBCREAT 0.6 12/10/2022    # Foot check nightly.  # Annual dilated diabetic eye exams.   - Diet: Make healthy diabetic food choices - Life style / activity / exercise: Discussed.  2. Blood pressure  -  BP Readings from Last 1 Encounters:  05/20/23 112/70    - Control is in target.  - No change in current plans.  3. Lipid status / Hyperlipidemia - Last  Lab Results  Component Value Date   LDLCALC 113 (H) 03/16/2023   - Continue atorvastatin  40 mg daily.  Will check lab in next follow-up visit.  Diagnoses and all orders for this visit:  Controlled type 2 diabetes mellitus without complication, without long-term current use of insulin  (HCC) -     canagliflozin  (INVOKANA ) 300 MG TABS tablet; Take 1 tablet (300 mg total) by mouth daily before breakfast. -     Microalbumin / creatinine urine ratio -     Hemoglobin A1c -     BASIC METABOLIC PANEL WITH GFR -     Lipid panel  Other  orders -     atorvastatin  (LIPITOR) 40 MG tablet; Take 1 tablet (40 mg total) by mouth daily. -     tirzepatide  (MOUNJARO ) 7.5 MG/0.5ML Pen; Inject 7.5 mg into the skin once a week. -     Continuous Glucose Sensor (DEXCOM G7 SENSOR) MISC; 1 Device by Does not apply route continuous. -     Continuous Glucose Sensor (FREESTYLE LIBRE 3 PLUS SENSOR) MISC; 1 each by Does not apply route continuous. Change every 15 days. -     blood glucose meter kit and supplies; Use in the morning, afternoon, and at bedtime. -     Glucose Blood (BLOOD GLUCOSE TEST STRIPS) STRP; Use 1 each daily to test blood glucose levels. -     Lancet Device MISC; Use in the morning, at noon, and at bedtime. -     Lancets Misc. MISC; 1 each by Does not apply route daily. May substitute to any manufacturer covered by patient's insurance.    DISPOSITION Follow up in clinic in 4  months suggested.  Labs prior to  follow-up visit.   All questions answered and patient verbalized understanding of the plan.  Cleo Villamizar, MD [facility] [address] [place], [address] [name], [place] [address] Phone # [phone]  At least part of this note was generated using voice recognition software. Inadvertent word errors may have occurred, which were not recognized during the proofreading process.

## 2023-05-23 ENCOUNTER — Other Ambulatory Visit (HOSPITAL_BASED_OUTPATIENT_CLINIC_OR_DEPARTMENT_OTHER): Payer: Self-pay

## 2023-05-24 ENCOUNTER — Other Ambulatory Visit (HOSPITAL_BASED_OUTPATIENT_CLINIC_OR_DEPARTMENT_OTHER): Payer: Self-pay

## 2023-05-26 ENCOUNTER — Other Ambulatory Visit (HOSPITAL_BASED_OUTPATIENT_CLINIC_OR_DEPARTMENT_OTHER): Payer: Self-pay

## 2023-05-26 ENCOUNTER — Other Ambulatory Visit: Payer: Self-pay | Admitting: Endocrinology

## 2023-05-31 ENCOUNTER — Other Ambulatory Visit (HOSPITAL_BASED_OUTPATIENT_CLINIC_OR_DEPARTMENT_OTHER): Payer: Self-pay

## 2023-06-01 ENCOUNTER — Encounter: Payer: Self-pay | Admitting: Endocrinology

## 2023-06-01 ENCOUNTER — Other Ambulatory Visit: Payer: Self-pay

## 2023-06-01 ENCOUNTER — Other Ambulatory Visit (HOSPITAL_BASED_OUTPATIENT_CLINIC_OR_DEPARTMENT_OTHER): Payer: Self-pay

## 2023-06-01 DIAGNOSIS — E119 Type 2 diabetes mellitus without complications: Secondary | ICD-10-CM

## 2023-06-01 MED ORDER — TIRZEPATIDE 7.5 MG/0.5ML ~~LOC~~ SOAJ
7.5000 mg | SUBCUTANEOUS | 3 refills | Status: DC
  Filled 2023-06-01: qty 2, 28d supply, fill #0
  Filled 2023-06-20 – 2023-06-22 (×2): qty 2, 28d supply, fill #1
  Filled 2023-08-11: qty 2, 28d supply, fill #2
  Filled 2023-09-13 – 2023-09-14 (×2): qty 2, 28d supply, fill #3
  Filled 2023-11-01: qty 2, 28d supply, fill #4
  Filled 2023-11-29: qty 2, 28d supply, fill #5

## 2023-06-01 MED ORDER — TIRZEPATIDE 7.5 MG/0.5ML ~~LOC~~ SOAJ: 7.5000 mg | SUBCUTANEOUS | 3 refills | Status: DC

## 2023-06-08 ENCOUNTER — Encounter: Payer: Self-pay | Admitting: Gastroenterology

## 2023-06-20 ENCOUNTER — Other Ambulatory Visit: Payer: Self-pay

## 2023-06-20 ENCOUNTER — Other Ambulatory Visit (HOSPITAL_COMMUNITY): Payer: Self-pay

## 2023-06-20 ENCOUNTER — Other Ambulatory Visit (HOSPITAL_BASED_OUTPATIENT_CLINIC_OR_DEPARTMENT_OTHER): Payer: Self-pay

## 2023-06-24 ENCOUNTER — Telehealth (HOSPITAL_BASED_OUTPATIENT_CLINIC_OR_DEPARTMENT_OTHER): Payer: Self-pay

## 2023-06-27 ENCOUNTER — Other Ambulatory Visit (HOSPITAL_BASED_OUTPATIENT_CLINIC_OR_DEPARTMENT_OTHER): Payer: Self-pay

## 2023-07-01 ENCOUNTER — Other Ambulatory Visit (HOSPITAL_BASED_OUTPATIENT_CLINIC_OR_DEPARTMENT_OTHER): Payer: Self-pay

## 2023-07-20 ENCOUNTER — Other Ambulatory Visit (HOSPITAL_BASED_OUTPATIENT_CLINIC_OR_DEPARTMENT_OTHER): Payer: Self-pay

## 2023-07-28 ENCOUNTER — Ambulatory Visit
Admission: RE | Admit: 2023-07-28 | Discharge: 2023-07-28 | Disposition: A | Discharge status: Home / Self Care | Source: Ambulatory Visit | Attending: Internal Medicine | Admitting: Internal Medicine

## 2023-07-28 ENCOUNTER — Other Ambulatory Visit: Payer: Self-pay

## 2023-07-28 VITALS — BP 141/79 | HR 74 | Temp 98.3°F | Resp 16

## 2023-07-28 DIAGNOSIS — N3001 Acute cystitis with hematuria: Secondary | ICD-10-CM | POA: Diagnosis present

## 2023-07-28 DIAGNOSIS — R350 Frequency of micturition: Secondary | ICD-10-CM | POA: Insufficient documentation

## 2023-07-28 DIAGNOSIS — R300 Dysuria: Secondary | ICD-10-CM | POA: Insufficient documentation

## 2023-07-28 LAB — POCT URINALYSIS DIP (MANUAL ENTRY)
Bilirubin, UA: NEGATIVE
Glucose, UA: 500 mg/dL — AB
Ketones, POC UA: NEGATIVE mg/dL
Nitrite, UA: POSITIVE — AB
Protein Ur, POC: 100 mg/dL — AB
Spec Grav, UA: 1.02 (ref 1.010–1.025)
Urobilinogen, UA: 0.2 U/dL
pH, UA: 6.5 (ref 5.0–8.0)

## 2023-07-28 LAB — POCT FASTING CBG KUC MANUAL ENTRY: POCT Glucose (KUC): 94 mg/dL (ref 70–99)

## 2023-07-28 MED ORDER — CEPHALEXIN 500 MG PO CAPS: 500.0000 mg | ORAL_CAPSULE | Freq: Two times a day (BID) | ORAL | 0 refills | Status: AC

## 2023-07-28 NOTE — ED Provider Notes (Signed)
 [facility]    CSN: [id number] Arrival date & time: 07/28/23  1300      History   Chief Complaint Chief Complaint  Patient presents with   Urinary Frequency    UTI, I have tried over the counter and it's not working for the last 2 days so I know I need an  antibiotic - Entered by patient    HPI Susan Whitaker is a 53 y.o. female.   Patient presents with concerns for urinary tract infection given she has been having urinary burning and frequency over the past 2 to 3 days.  Reports she has also had chills but no documented fever.  Denies history of frequent urinary tract infections.  Denies any new vaginal discharge.  Reports that she has used Azo over-the-counter with minimal improvement.   Urinary Frequency    Past Medical History:  Diagnosis Date   Abdominal bloating 09/14/2020   Abdominal pain 10/04/2020   Class 2 obesity due to excess calories with body mass index (BMI) of 35.0 to 35.9 in adult 06/2010   DM type 2 (diabetes mellitus, type 2) (HCC)    Gastric ulcer    GERD (gastroesophageal reflux disease)    H/O constipation    H/O hemorrhoids    History of small bowel obstruction 06/02/2019   Hyperlipidemia    Hypertension    Hypokalemia 2019   Menorrhagia    Microalbuminuria 02/15/2012   Primary hyperaldosteronism (HCC) 02/21/2012   S/P radiofrequency ablation operation for arrhythmia 10/03/20 10/04/2020   SVT (supraventricular tachycardia) (HCC)    Noted 11/2011 admission   SVT (supraventricular tachycardia) (HCC) 12/29/2011   Thrombocytopenia (HCC)    Type 2 diabetes mellitus with obesity (HCC) 11/17/2020   IMO SNOMED Dx Update Oct 2024     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    Patient Active Problem List   Diagnosis Date Noted   FH: breast cancer in first degree relative 01/05/2021   Chronic abdominal pain 10/04/2020   S/P radiofrequency ablation operation for arrhythmia 10/03/20 10/04/2020   Chronic idiopathic constipation 09/14/2020   Obesity (BMI  30-39.9) 09/14/2020   Chronic narcotic use 09/14/2020   IUD (intrauterine device) in place 09/14/2020   Polyp of corpus uteri 01/08/2020   History of small bowel obstruction 06/02/2019   Vitamin D deficiency 05/26/2019   Anxiety state 07/14/2012   Primary hyperaldosteronism (HCC) 02/21/2012   Multiple thyroid nodules 01/04/2012   Cervical pain (neck) 05/28/2011   Hemorrhoids 02/07/2009   Type 2 diabetes mellitus (HCC) 02/07/2009   ALLERGIC RHINITIS 05/24/2008   Hyperlipidemia associated with type 2 diabetes mellitus (HCC) 04/22/2008   URINARY INCONTINENCE 04/22/2008   Essential thrombocythemia (HCC) 04/22/2008   HTN (hypertension) 01/04/2007   GERD 01/04/2007    Past Surgical History:  Procedure Laterality Date   ABDOMINAL ADHESION SURGERY  06/03/2019   Dr Carolynne Edouard   ABDOMINAL SURGERY     34 months of age-- unsure of type of surgery   COLONOSCOPY  04/2016   hemorrhoids--normal per pt with Dr Loreta Ave   DILATATION & CURETTAGE/HYSTEROSCOPY WITH MYOSURE N/A 02/08/2020   Procedure: DILATATION & CURETTAGE/HYSTEROSCOPY WITH MYOSURE;  Surgeon: Hoover Browns, MD;  Location: [facility];  Service: Gynecology;  Laterality: N/A;   HEMORRHOID SURGERY  2005/2006   INTRAUTERINE DEVICE (IUD) INSERTION N/A 02/08/2020   Procedure: INTRAUTERINE DEVICE (IUD) INSERTION UNDER ULTRASOUND GUIDANCE;  Surgeon: Hoover Browns, MD;  Location: [facility];  Service: Gynecology;  Laterality: N/A;  Mirena   LAPAROTOMY N/A 06/03/2019  Procedure: EXPLORATORY LAPAROTOMY WITH LYSIS OF ADHESIONS;  Surgeon: Griselda Miner, MD;  Location: [facility];  Service: General;  Laterality: N/A;   SVT ABLATION N/A 10/03/2020   Procedure: SVT ABLATION;  Surgeon: Marinus Maw, MD;  Location: [facility];  Service: Cardiovascular;  Laterality: N/A;   UTERINE FIBROID EMBOLIZATION  2010   at [facility]    OB History     Gravida  4   Para  2   Term  2   Preterm      AB  1   Living  2      SAB  1    IAB      Ectopic      Multiple      Live Births               Home Medications    Prior to Admission medications   Medication Sig Start Date End Date Taking? Authorizing Provider  cephALEXin (KEFLEX) 500 MG capsule Take 1 capsule (500 mg total) by mouth 2 (two) times daily for 7 days. 07/28/23 08/04/23 Yes Ithzel Fedorchak, Acie Fredrickson, FNP  acetaminophen (TYLENOL) 325 MG tablet Take 2 tablets (650 mg total) by mouth every 4 (four) hours as needed for headache or mild pain. 10/04/20   Leone Brand, NP  amLODipine (NORVASC) 10 MG tablet Take 1 tablet (10 mg total) by mouth daily. 03/16/23   Nche, Bonna Gains, NP  atorvastatin (LIPITOR) 40 MG tablet Take 1 tablet (40 mg total) by mouth daily. 05/20/23   Thapa, [place], MD  baclofen (LIORESAL) 10 MG tablet Take 1 tablet (10 mg total) by mouth 3 (three) times daily. 06/02/22   Loyola Mast, MD  blood glucose meter kit and supplies Use in the morning, afternoon, and at bedtime. 05/20/23   Thapa, [place], MD  canagliflozin (INVOKANA) 300 MG TABS tablet Take 1 tablet (300 mg total) by mouth daily before breakfast. 05/20/23   Thapa, [place], MD  Continuous Glucose Sensor (DEXCOM G7 SENSOR) MISC Apply 1 sensor as directed and replace every 10 days. 05/20/23   Thapa, [place], MD  Continuous Glucose Sensor (FREESTYLE LIBRE 3 PLUS SENSOR) MISC Apply 1 sensor as directed and replace every 15 days. 05/20/23   Thapa, [place], MD  glucose blood (ACCU-CHEK GUIDE) test strip Use as instructed to check once daily. 10/20/21   Reather Littler, MD  Glucose Blood (BLOOD GLUCOSE TEST STRIPS) STRP Use 1 each daily to test blood glucose levels. 05/20/23 05/19/24  Thapa, [place], MD  levonorgestrel (MIRENA, 52 MG,) 20 MCG/DAY IUD Mirena 20 mcg/24 hours (7 yrs) 52 mg intrauterine device  Take by intrauterine route.    [provider]  losartan (COZAAR) 100 MG tablet Take 1 tablet (100 mg total) by mouth daily. 05/13/23   Nche, Bonna Gains, NP  ONETOUCH DELICA LANCETS FINE MISC Use to check  blood sugar 3 times daily 09/28/16   Reather Littler, MD  oxyCODONE-acetaminophen (PERCOCET/ROXICET) 5-325 MG tablet Take 1 tablet by mouth every 6 (six) hours as needed.    Renae Gloss, Melissa J, PA-C  polyethylene glycol (MIRALAX / GLYCOLAX) 17 g packet Take 17 g by mouth once a week.    [provider]  Probiotic Product (ULTRAFLORA IMMUNE HEALTH PO) Take 1 capsule by mouth daily.    [provider]  spironolactone (ALDACTONE) 100 MG tablet Take 1 tablet by mouth once daily 05/13/23   Nche, Bonna Gains, NP  tirzepatide [facility]) 7.5 MG/0.5ML Pen Inject 7.5 mg into  the skin once a week. 06/01/23   Thapa, [place], MD    Family History Family History  Problem Relation Age of Onset   Cancer Mother 25       breast   Stroke Mother    Vision loss Father        some   Heart disease Father        Rhythm disturbance   Hypertension Sister    Hypertension Brother    Hypertension Brother    Cancer Maternal Grandmother 48       breast   Hypertension Maternal Grandmother    Cancer Other        breast, lung   Hypertension Other    Stroke Other    Cancer Maternal Aunt 50       Breast   Diabetes Neg Hx     Social History Social History   Tobacco Use   Smoking status: Never   Smokeless tobacco: Never  Vaping Use   Vaping status: Never Used  Substance Use Topics   Alcohol use: No   Drug use: No     Allergies   Lisinopril   Review of Systems Review of Systems Per HPI  Physical Exam Triage Vital Signs ED Triage Vitals  Encounter Vitals Group     BP 07/28/23 1332 (!) 141/79     Systolic BP Percentile --      Diastolic BP Percentile --      Pulse Rate 07/28/23 1332 74     Resp 07/28/23 1332 16     Temp 07/28/23 1332 98.3 F (36.8 C)     Temp Source 07/28/23 1332 Oral     SpO2 07/28/23 1332 97 %     Weight --      Height --      Head Circumference --      Peak Flow --      Pain Score 07/28/23 1330 3     Pain Loc --      Pain Education --      Exclude  from Growth Chart --    No data found.  Updated Vital Signs BP (!) 141/79 (BP Location: Right Arm)   Pulse 74   Temp 98.3 F (36.8 C) (Oral)   Resp 16   LMP  (LMP Unknown)   SpO2 97%   Visual Acuity Right Eye Distance:   Left Eye Distance:   Bilateral Distance:    Right Eye Near:   Left Eye Near:    Bilateral Near:     Physical Exam Constitutional:      General: She is not in acute distress.    Appearance: Normal appearance. She is not toxic-appearing or diaphoretic.  HENT:     Head: Normocephalic and atraumatic.  Eyes:     Extraocular Movements: Extraocular movements intact.     Conjunctiva/sclera: Conjunctivae normal.  Pulmonary:     Effort: Pulmonary effort is normal.  Neurological:     General: No focal deficit present.     Mental Status: She is alert and oriented to person, place, and time. Mental status is at baseline.  Psychiatric:        Mood and Affect: Mood normal.        Behavior: Behavior normal.        Thought Content: Thought content normal.        Judgment: Judgment normal.      UC Treatments / Results  Labs (all labs ordered are listed, but only abnormal results  are displayed) Labs Reviewed  POCT URINALYSIS DIP (MANUAL ENTRY) - Abnormal; Notable for the following components:      Result Value   Clarity, UA cloudy (*)    Glucose, UA =500 (*)    Blood, UA small (*)    Protein Ur, POC =100 (*)    Nitrite, UA Positive (*)    Leukocytes, UA Trace (*)    All other components within normal limits  POCT FASTING CBG KUC MANUAL ENTRY - Normal  URINE CULTURE    EKG   Radiology No results found.  Procedures Procedures (including critical care time)  Medications Ordered in UC Medications - No data to display  Initial Impression / Assessment and Plan / UC Course  I have reviewed the triage vital signs and the nursing notes.  Pertinent labs & imaging results that were available during my care of the patient were reviewed by me and  considered in my medical decision making (see chart for details).     UA indicating possible urinary tract infection but patient has been taking Azo.  Will send urine culture to confirm.  Cephalexin prescribed for UTI while awaiting urine culture.  Advised adequate fluids as well.  Blood glucose completed today given glucose on UA.  It was normal.  This is most likely due to infection and/or patient's daily medications for her diabetes.  Encouraged strict follow-up precautions.  Patient verbalized understanding and was agreeable with plan. Final Clinical Impressions(s) / UC Diagnoses   Final diagnoses:  Acute cystitis with hematuria  Dysuria  Urinary frequency     Discharge Instructions      I have prescribed an antibiotic to take for UTI.  Urine culture is pending.  Will call when it results.     ED Prescriptions     Medication Sig Dispense Auth. Provider   cephALEXin (KEFLEX) 500 MG capsule Take 1 capsule (500 mg total) by mouth 2 (two) times daily for 7 days. 14 capsule [place], Acie Fredrickson, [place]      PDMP not reviewed this encounter.   Gustavus Bryant, [place] 07/28/23 1444

## 2023-07-28 NOTE — ED Triage Notes (Signed)
 UTI, I have tried over the counter and it's not working for the last 2 days so I know I need an  antibiotic - Entered by patient

## 2023-07-28 NOTE — Discharge Instructions (Addendum)
 I have prescribed an antibiotic to take for UTI.  Urine culture is pending.  Will call when it results.

## 2023-07-29 LAB — URINE CULTURE: Culture: <10000 — AB

## 2023-08-11 ENCOUNTER — Other Ambulatory Visit (HOSPITAL_BASED_OUTPATIENT_CLINIC_OR_DEPARTMENT_OTHER): Payer: Self-pay

## 2023-08-11 ENCOUNTER — Other Ambulatory Visit: Payer: Self-pay

## 2023-08-12 ENCOUNTER — Other Ambulatory Visit (HOSPITAL_COMMUNITY): Payer: Self-pay

## 2023-08-12 ENCOUNTER — Telehealth: Payer: Self-pay | Admitting: Pharmacy Technician

## 2023-08-12 NOTE — Telephone Encounter (Signed)
 Pharmacy Patient Advocate Encounter   Received notification from CoverMyMeds that prior authorization for Dexcom G7 Sensor  is required/requested.   Insurance verification completed.   The patient is insured through [facility] .   Per test claim: PA required; PA submitted to above mentioned insurance via CoverMyMeds Key/confirmation #/EOC [id number] Status is pending

## 2023-08-15 ENCOUNTER — Other Ambulatory Visit (HOSPITAL_BASED_OUTPATIENT_CLINIC_OR_DEPARTMENT_OTHER): Payer: Self-pay

## 2023-08-15 NOTE — Telephone Encounter (Signed)
 Pharmacy Patient Advocate Encounter  Received notification from [facility] that Prior Authorization for Dexcom G7 Sensor has been DENIED.  Full denial letter will be uploaded to the media tab. See denial reason below.    PA #/Case ID/Reference #: [id number]

## 2023-08-16 ENCOUNTER — Other Ambulatory Visit (INDEPENDENT_AMBULATORY_CARE_PROVIDER_SITE_OTHER)

## 2023-08-16 ENCOUNTER — Encounter: Payer: Self-pay | Admitting: Gastroenterology

## 2023-08-16 ENCOUNTER — Ambulatory Visit: Payer: 59 | Admitting: Gastroenterology

## 2023-08-16 ENCOUNTER — Other Ambulatory Visit (HOSPITAL_BASED_OUTPATIENT_CLINIC_OR_DEPARTMENT_OTHER): Payer: Self-pay

## 2023-08-16 VITALS — BP 122/70 | HR 89 | Ht 63.0 in | Wt 179.0 lb

## 2023-08-16 DIAGNOSIS — G8929 Other chronic pain: Secondary | ICD-10-CM | POA: Diagnosis not present

## 2023-08-16 DIAGNOSIS — R109 Unspecified abdominal pain: Secondary | ICD-10-CM

## 2023-08-16 DIAGNOSIS — K5909 Other constipation: Secondary | ICD-10-CM | POA: Diagnosis not present

## 2023-08-16 DIAGNOSIS — R112 Nausea with vomiting, unspecified: Secondary | ICD-10-CM

## 2023-08-16 DIAGNOSIS — R12 Heartburn: Secondary | ICD-10-CM

## 2023-08-16 LAB — COMPREHENSIVE METABOLIC PANEL WITH GFR
ALT: 21 U/L (ref 0–35)
AST: 18 U/L (ref 0–37)
Albumin: 4.3 g/dL (ref 3.5–5.2)
Alkaline Phosphatase: 60 U/L (ref 39–117)
BUN: 17 mg/dL (ref 6–23)
CO2: 30 meq/L (ref 19–32)
Calcium: 9.2 mg/dL (ref 8.4–10.5)
Chloride: 102 meq/L (ref 96–112)
Creatinine, Ser: 0.79 mg/dL (ref 0.40–1.20)
GFR: 84.79 mL/min (ref >60.00)
Glucose, Bld: 96 mg/dL (ref 70–99)
Potassium: 3.3 meq/L — ABNORMAL LOW (ref 3.5–5.1)
Sodium: 141 meq/L (ref 135–145)
Total Bilirubin: 0.3 mg/dL (ref 0.2–1.2)
Total Protein: 8.1 g/dL (ref 6.0–8.3)

## 2023-08-16 LAB — CBC WITH DIFFERENTIAL/PLATELET
Basophils Absolute: 0.1 10*3/uL (ref 0.0–0.1)
Basophils Relative: 1.1 % (ref 0.0–3.0)
Eosinophils Absolute: 0.1 10*3/uL (ref 0.0–0.7)
Eosinophils Relative: 2 % (ref 0.0–5.0)
HCT: 38.5 % (ref 36.0–46.0)
Hemoglobin: 13 g/dL (ref 12.0–15.0)
Lymphocytes Relative: 36.6 % (ref 12.0–46.0)
Lymphs Abs: 2.2 10*3/uL (ref 0.7–4.0)
MCHC: 33.7 g/dL (ref 30.0–36.0)
MCV: 89.5 fl (ref 78.0–100.0)
Monocytes Absolute: 0.5 10*3/uL (ref 0.1–1.0)
Monocytes Relative: 9.1 % (ref 3.0–12.0)
Neutro Abs: 3 10*3/uL (ref 1.4–7.7)
Neutrophils Relative %: 51.2 % (ref 43.0–77.0)
Platelets: 483 10*3/uL — ABNORMAL HIGH (ref 150.0–400.0)
RBC: 4.3 Mil/uL (ref 3.87–5.11)
RDW: 14 % (ref 11.5–15.5)
WBC: 6 10*3/uL (ref 4.0–10.5)

## 2023-08-16 LAB — LIPASE: Lipase: 25 U/L (ref 11.0–59.0)

## 2023-08-16 LAB — TSH: TSH: 1.4 u[IU]/mL (ref 0.35–5.50)

## 2023-08-16 MED ORDER — PANTOPRAZOLE SODIUM 40 MG PO TBEC
40.0000 mg | DELAYED_RELEASE_TABLET | Freq: Every day | ORAL | 3 refills | Status: DC
  Filled 2023-08-16 – 2023-09-14 (×3): qty 30, 30d supply, fill #0
  Filled 2023-10-07 – 2023-11-01 (×2): qty 30, 30d supply, fill #1
  Filled 2023-11-29: qty 30, 30d supply, fill #2

## 2023-08-16 NOTE — Progress Notes (Signed)
 Chief Complaint: Chronic abdominal pain  Referring Provider:  Anne Ng, NP      ASSESSMENT AND PLAN;   #1. Chronic abdo pain. H/O PSBO s/p X-lap jan 2021  #2. N/V/hearburn  #3. Chronic constipation (better with miralax). Neg colon 04/2016  Plan: -CTE -Trial of protonix 40mg  po every day #90 -CBC, CMP, lipase, TSH, celiac, food allergy testing -EGD (off Mounjaro x 7 days) -If still with problems, solid phase GES followed by colonoscopy, if needed. -Continue MiraLAX.   HPI:    Susan Whitaker is a 53 y.o. female  Had X-lap as newborn (70-38 month old at [facility]) and then S/P X- lap with LOA 06/03/2019 for partial small bowel obstruction With multiple medical problems as below including DM on Mounjaro, HTN, T2DM, primary hypoaldosteronism, h/o recurrent SBO with first episode in infancy, s/p ex lap and lysis of adhesions (05/2019)   C/O chronic abdominal pain -has been to multiple gastroenterologists including [facility]   She has experienced persistent abdominal pain since undergoing surgery for a bowel obstruction on June 03, 2019. The pain is diffuse, particularly affecting the midsection and lower abdomen, with a daily pain level of 3 out of 10. She has been hospitalized multiple times post-surgery, with the last hospitalization in July 2022 due to severe pain. A consultation confirmed issues with her previous surgery, noting a partial small bowel obstruction. She has been under the care of a pain specialist since then.  She experiences frequent nausea and vomiting, which she associates with certain foods. She has adjusted her diet to avoid foods that exacerbate these symptoms. Heartburn is present but not severe and is managed with over-the-counter gummies.  Her bowel habits fluctuate between diarrhea and constipation. She uses yogurt and Miralax to manage constipation, though it does not alleviate the pain.  She has a history of bowel obstruction surgery as a newborn  and again 50 years later. She recalls a colonoscopy in December 2017, which revealed small polyps, and she believes she has not had another since then.  No family history of colon cancer or polyps, but she plans to confirm with her father.  She avoids wearing belts or tight clothing due to abdominal discomfort and has adjusted her diet to manage nausea and vomiting.  No epistaxis or significant gum bleeding. Reports heartburn, nausea, vomiting, and fluctuating bowel habits. No recent endoscopy performed.      Wt Readings from Last 3 Encounters:  08/16/23 179 lb (81.2 kg)  05/20/23 185 lb 9.6 oz (84.2 kg)  03/16/23 185 lb (83.9 kg)       Past GI workup:  Colonoscopy 05/03/2016 by Dr. Loreta Ave -Colonic polyps status post polypectomy. Bx- Hyperplastic -Single sigmoid diverticula -Internal hemorrhoids  CT Abdo/pelvis 11/2020 Dilated loop small bowel in the right mid abdomen with transition, suggesting partial small bowel obstruction, likely on the basis of adhesions. No small bowel wall thickening or pneumatosis.  No free air.  Past Medical History:  Diagnosis Date   Abdominal bloating 09/14/2020   Abdominal pain 10/04/2020   Class 2 obesity due to excess calories with body mass index (BMI) of 35.0 to 35.9 in adult 06/2010   DM type 2 (diabetes mellitus, type 2) (HCC)    Gastric ulcer    GERD (gastroesophageal reflux disease)    H/O constipation    H/O hemorrhoids    History of small bowel obstruction 06/02/2019   Hyperlipidemia    Hypertension    Hypokalemia 2019   Menorrhagia  Microalbuminuria 02/15/2012   Primary hyperaldosteronism (HCC) 02/21/2012   S/P radiofrequency ablation operation for arrhythmia 10/03/20 10/04/2020   SVT (supraventricular tachycardia) (HCC)    Noted 11/2011 admission   SVT (supraventricular tachycardia) (HCC) 12/29/2011   Thrombocytopenia (HCC)    Type 2 diabetes mellitus with obesity (HCC) 11/17/2020   IMO SNOMED Dx Update Oct 2024      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    Past Surgical History:  Procedure Laterality Date   ABDOMINAL ADHESION SURGERY  06/03/2019   Dr Carolynne Edouard   ABDOMINAL SURGERY     72 months of age-- unsure of type of surgery   COLONOSCOPY  04/2016   hemorrhoids--normal per pt with Dr Loreta Ave   DILATATION & CURETTAGE/HYSTEROSCOPY WITH MYOSURE N/A 02/08/2020   Procedure: DILATATION & CURETTAGE/HYSTEROSCOPY WITH MYOSURE;  Surgeon: Hoover Browns, MD;  Location: [facility];  Service: Gynecology;  Laterality: N/A;   HEMORRHOID SURGERY  2005/2006   INTRAUTERINE DEVICE (IUD) INSERTION N/A 02/08/2020   Procedure: INTRAUTERINE DEVICE (IUD) INSERTION UNDER ULTRASOUND GUIDANCE;  Surgeon: Hoover Browns, MD;  Location: [facility];  Service: Gynecology;  Laterality: N/A;  Mirena   LAPAROTOMY N/A 06/03/2019   Procedure: EXPLORATORY LAPAROTOMY WITH LYSIS OF ADHESIONS;  Surgeon: Griselda Miner, MD;  Location: [facility];  Service: General;  Laterality: N/A;   SVT ABLATION N/A 10/03/2020   Procedure: SVT ABLATION;  Surgeon: Marinus Maw, MD;  Location: [facility];  Service: Cardiovascular;  Laterality: N/A;   UTERINE FIBROID EMBOLIZATION  2010   at [facility]    Family History  Problem Relation Age of Onset   Cancer Mother 61       breast   Stroke Mother    Vision loss Father        some   Heart disease Father        Rhythm disturbance   Hypertension Sister    Hypertension Brother    Hypertension Brother    Cancer Maternal Grandmother 26       breast   Hypertension Maternal Grandmother    Cancer Other        breast, lung   Hypertension Other    Stroke Other    Cancer Maternal Aunt 50       Breast   Diabetes Neg Hx     Social History   Tobacco Use   Smoking status: Never   Smokeless tobacco: Never  Vaping Use   Vaping status: Never Used  Substance Use Topics   Alcohol use: No   Drug use: No    Current Outpatient Medications  Medication Sig Dispense Refill   acetaminophen  (TYLENOL) 325 MG tablet Take 2 tablets (650 mg total) by mouth every 4 (four) hours as needed for headache or mild pain.     amLODipine (NORVASC) 10 MG tablet Take 1 tablet (10 mg total) by mouth daily. 90 tablet 3   atorvastatin (LIPITOR) 40 MG tablet Take 1 tablet (40 mg total) by mouth daily. 90 tablet 3   baclofen (LIORESAL) 10 MG tablet Take 1 tablet (10 mg total) by mouth 3 (three) times daily. 30 each 0   blood glucose meter kit and supplies Use in the morning, afternoon, and at bedtime. 1 each 0   canagliflozin (INVOKANA) 300 MG TABS tablet Take 1 tablet (300 mg total) by mouth daily before breakfast. 90 tablet 4   Continuous Glucose Sensor (DEXCOM G7 SENSOR) MISC Apply 1 sensor as directed and replace every 10 days. 9  each 0   Continuous Glucose Sensor (FREESTYLE LIBRE 3 PLUS SENSOR) MISC Apply 1 sensor as directed and replace every 15 days. 6 each 3   glucose blood (ACCU-CHEK GUIDE) test strip Use as instructed to check once daily. 50 each 12   Glucose Blood (BLOOD GLUCOSE TEST STRIPS) STRP Use 1 each daily to test blood glucose levels. 100 each 3   levonorgestrel (MIRENA, 52 MG,) 20 MCG/DAY IUD Mirena 20 mcg/24 hours (7 yrs) 52 mg intrauterine device  Take by intrauterine route.     ONETOUCH DELICA LANCETS FINE MISC Use to check blood sugar 3 times daily 100 each 3   oxyCODONE-acetaminophen (PERCOCET/ROXICET) 5-325 MG tablet Take 1 tablet by mouth every 6 (six) hours as needed.     polyethylene glycol (MIRALAX / GLYCOLAX) 17 g packet Take 17 g by mouth once a week.     Probiotic Product (ULTRAFLORA IMMUNE HEALTH PO) Take 1 capsule by mouth daily.     tirzepatide (MOUNJARO) 7.5 MG/0.5ML Pen Inject 7.5 mg into the skin once a week. 6 mL 3   losartan (COZAAR) 100 MG tablet Take 1 tablet (100 mg total) by mouth daily. (Patient not taking: Reported on 08/16/2023) 90 tablet 1   spironolactone (ALDACTONE) 100 MG tablet Take 1 tablet by mouth once daily (Patient not taking: Reported on 08/16/2023)  90 tablet 1   No current facility-administered medications for this visit.    Allergies  Allergen Reactions   Lisinopril Cough    Review of Systems:  Constitutional: Denies fever, chills, diaphoresis, appetite change and fatigue.  HEENT: neg Respiratory: Denies SOB, DOE, cough, chest tightness,  and wheezing.   Cardiovascular: Denies chest pain, palpitations and leg swelling.  Genitourinary: Denies dysuria, urgency, frequency, hematuria, flank pain and difficulty urinating.  Musculoskeletal: Denies myalgias, has back pain, joint swelling, arthralgias and gait problem.  Skin: No rash.  Neurological: Denies dizziness, seizures, syncope, weakness, light-headedness, numbness and headaches.  Hematological: Denies adenopathy. Easy bruising, personal or family bleeding history  Psychiatric/Behavioral: No anxiety or depression     Physical Exam:    BP 122/70   Pulse 89   Ht 5\' 3"  (1.6 m)   Wt 179 lb (81.2 kg)   LMP  (LMP Unknown)   BMI 31.71 kg/m  Wt Readings from Last 3 Encounters:  08/16/23 179 lb (81.2 kg)  05/20/23 185 lb 9.6 oz (84.2 kg)  03/16/23 185 lb (83.9 kg)   Constitutional:  Well-developed, in no acute distress. Psychiatric: Normal mood and affect. Behavior is normal. HEENT: Pupils normal.  Conjunctivae are normal. No scleral icterus. Neck supple.  Cardiovascular: Normal rate, regular rhythm. No edema Pulmonary/chest: Effort normal and breath sounds normal. No wheezing, rales or rhonchi. Abdominal: Soft, nondistended. Nontender. Bowel sounds active throughout. There are no masses palpable. No hepatomegaly. Rectal: Deferred Neurological: Alert and oriented to person place and time. Skin: Skin is warm and dry. No rashes noted.  Data Reviewed: I have personally reviewed following labs and imaging studies  CBC:    Latest Ref Rng & Units 03/16/2023   10:06 AM 02/24/2022   10:51 AM 01/22/2022    9:29 AM  CBC  WBC 4.0 - 10.5 K/uL 7.9  7.1  6.2   Hemoglobin 12.0  - 15.0 g/dL 91.4  78.2  95.6   Hematocrit 36.0 - 46.0 % 39.3  43.0  40.1   Platelets 150.0 - 400.0 K/uL 446.0  393  447.0     CMP:    Latest Ref Rng &  Units 05/06/2023    8:22 AM 03/16/2023   10:06 AM 12/10/2022   12:16 PM  CMP  Glucose 65 - 99 mg/dL 99  91  784   BUN 7 - 25 mg/dL 20  20  19    Creatinine 0.50 - 1.03 mg/dL 6.96  2.95  2.84   Sodium 135 - 146 mmol/L 142  138  139   Potassium 3.5 - 5.3 mmol/L 4.5  4.0  4.0   Chloride 98 - 110 mmol/L 103  101  103   CO2 20 - 32 mmol/L 30  28  27    Calcium 8.6 - 10.4 mg/dL 13.2  9.4  9.5   Total Protein 6.0 - 8.3 g/dL  7.8    Total Bilirubin 0.2 - 1.2 mg/dL  0.4    Alkaline Phos 39 - 117 U/L  68    AST 0 - 37 U/L  14    ALT 0 - 35 U/L  15         Edman Circle, MD 08/16/2023, 10:17 AM  Cc: Anne Ng, NP

## 2023-08-16 NOTE — Patient Instructions (Addendum)
 _______________________________________________________  If your blood pressure at your visit was 140/90 or greater, please contact your primary care physician to follow up on this.  _______________________________________________________  If you are age 55 or older, your body mass index should be between 23-30. Your Body mass index is 31.71 kg/m. If this is out of the aforementioned range listed, please consider follow up with your Primary Care Provider.  If you are age 19 or younger, your body mass index should be between 19-25. Your Body mass index is 31.71 kg/m. If this is out of the aformentioned range listed, please consider follow up with your Primary Care Provider.   ________________________________________________________  The [facility] providers would like to encourage you to use [facility] to communicate with providers for non-urgent requests or questions.  Due to long hold times on the telephone, sending your provider a message by [facility] may be a faster and more efficient way to get a response.  Please allow 48 business hours for a response.  Please remember that this is for non-urgent requests.  _______________________________________________________  Your provider has requested that you go to the basement level for lab work before leaving today. Press "B" on the elevator. The lab is located at the first door on the left as you exit the elevator.  You have been scheduled for a ct enterography at [facility] (First floor of hospital)  on 08-19-23 at  330pm. Please arrive 1 hour and 30 minutes prior to scheduled appointment time. Nothing by mouth/Clear liquids 4 hours prior to this appointment. If you have any questions please call [phone].  We have sent the following medications to your pharmacy for you to pick up at your convenience: Protonix 40mg  daily  You have been scheduled for an endoscopy. Please follow written instructions given to you at your visit today.  If  you use inhalers (even only as needed), please bring them with you on the day of your procedure.  If you take any of the following medications, they will need to be adjusted prior to your procedure:   DO NOT TAKE 7 DAYS PRIOR TO TEST- Trulicity (dulaglutide) Ozempic, Wegovy (semaglutide) Mounjaro (tirzepatide) Bydureon Bcise (exanatide extended release)  DO NOT TAKE 1 DAY PRIOR TO YOUR TEST Rybelsus (semaglutide) Adlyxin (lixisenatide) Victoza (liraglutide) Byetta (exanatide) ___________________________________________________________________________  Thank you,  Dr. Lynann Bologna

## 2023-08-16 NOTE — Addendum Note (Signed)
 Addended by: Alberteen Sam E on: 08/16/2023 12:13 PM   Modules accepted: Orders

## 2023-08-17 ENCOUNTER — Ambulatory Visit: Admitting: Nurse Practitioner

## 2023-08-18 LAB — FOOD ALLERGY PROFILE

## 2023-08-18 LAB — INTERPRETATION:

## 2023-08-19 ENCOUNTER — Ambulatory Visit (HOSPITAL_COMMUNITY)

## 2023-08-21 LAB — CELIAC PANEL 10
Antigliadin Abs, IgA: 6 U (ref 0–19)
Endomysial IgA: NEGATIVE
Gliadin IgG: 2 U (ref 0–19)
IgA/Immunoglobulin A, Serum: 293 mg/dL (ref 87–352)
Tissue Transglut Ab: 8 U/mL — ABNORMAL HIGH (ref 0–5)
Transglutaminase IgA: <2 U/mL (ref 0–3)

## 2023-08-23 ENCOUNTER — Other Ambulatory Visit: Payer: Self-pay

## 2023-08-23 DIAGNOSIS — G8929 Other chronic pain: Secondary | ICD-10-CM

## 2023-08-23 DIAGNOSIS — R112 Nausea with vomiting, unspecified: Secondary | ICD-10-CM

## 2023-08-23 MED ORDER — POTASSIUM CHLORIDE CRYS ER 20 MEQ PO TBCR: 20.0000 meq | EXTENDED_RELEASE_TABLET | Freq: Every day | ORAL | 0 refills | Status: DC

## 2023-08-23 NOTE — Progress Notes (Unsigned)
 Lab and medication sent

## 2023-08-26 ENCOUNTER — Other Ambulatory Visit (HOSPITAL_BASED_OUTPATIENT_CLINIC_OR_DEPARTMENT_OTHER): Payer: Self-pay

## 2023-08-26 ENCOUNTER — Ambulatory Visit (HOSPITAL_COMMUNITY)
Admission: RE | Admit: 2023-08-26 | Discharge: 2023-08-26 | Disposition: A | Discharge status: Home / Self Care | Source: Ambulatory Visit | Attending: Gastroenterology | Admitting: Gastroenterology

## 2023-08-26 DIAGNOSIS — G8929 Other chronic pain: Secondary | ICD-10-CM

## 2023-08-26 DIAGNOSIS — R112 Nausea with vomiting, unspecified: Secondary | ICD-10-CM

## 2023-08-26 DIAGNOSIS — K5909 Other constipation: Secondary | ICD-10-CM

## 2023-08-29 ENCOUNTER — Telehealth: Payer: Self-pay | Admitting: Nurse Practitioner

## 2023-08-29 ENCOUNTER — Ambulatory Visit (HOSPITAL_COMMUNITY)

## 2023-08-29 ENCOUNTER — Encounter (HOSPITAL_COMMUNITY): Payer: Self-pay

## 2023-08-29 NOTE — Telephone Encounter (Signed)
 08/17/2023 late arrival 09/27/2022 no show  Final warning sent via mychart

## 2023-08-30 ENCOUNTER — Ambulatory Visit: Admitting: Nurse Practitioner

## 2023-08-30 ENCOUNTER — Encounter: Payer: Self-pay | Admitting: Nurse Practitioner

## 2023-08-30 VITALS — BP 118/78 | HR 85 | Temp 97.3°F | Ht 63.0 in | Wt 176.6 lb

## 2023-08-30 DIAGNOSIS — G8929 Other chronic pain: Secondary | ICD-10-CM | POA: Diagnosis not present

## 2023-08-30 DIAGNOSIS — R350 Frequency of micturition: Secondary | ICD-10-CM

## 2023-08-30 DIAGNOSIS — I10 Essential (primary) hypertension: Secondary | ICD-10-CM

## 2023-08-30 DIAGNOSIS — M545 Low back pain, unspecified: Secondary | ICD-10-CM

## 2023-08-30 LAB — POCT URINALYSIS DIPSTICK
Bilirubin, UA: NEGATIVE
Glucose, UA: POSITIVE — AB
Ketones, UA: NEGATIVE
Leukocytes, UA: NEGATIVE
Nitrite, UA: NEGATIVE
Protein, UA: POSITIVE — AB
Spec Grav, UA: 1.015 (ref 1.010–1.025)
Urobilinogen, UA: 0.2 U/dL
pH, UA: 6 (ref 5.0–8.0)

## 2023-08-30 NOTE — Assessment & Plan Note (Signed)
 Acute on chronic, worse in last 2months, describes as dull ache, mild and constant,  no other associated symptoms or known trigger, no rash, no radiating pain, no change in GI/GU function, denies any injury or previous surgery.  Suspect muscle strain Advised to use tylenol as needed for pain Start stretching exercise at home. She plans to joins a yoga class If no improvement in 4weeks, enter referral to physical therapy.

## 2023-08-30 NOTE — Patient Instructions (Addendum)
 Pain due to musculoskeletal pain. Ok to use tylenol as needed for pain Start stretching exercise at home.  If no improvement in 4weeks, call office for referral to physical therapy. Monitor BP at home 3x/week in AM Call office if BP >140/80 Continue to hold losartan and spironolactone  Schedule appointment for annual mammogram

## 2023-08-30 NOTE — Assessment & Plan Note (Addendum)
 Out of losartan and spironolactone x 35month Current use of amlodipine only BP at goal BP Readings from Last 3 Encounters:  08/30/23 118/78  08/16/23 122/70  07/28/23 (!) 141/79    Advised to monitor BP at home 2-3x/week in AM Maintain only amlodipine 10mg  at this time She is to call office if BP >140/80 F/up in 3months

## 2023-08-30 NOTE — Progress Notes (Signed)
 Acute Office Visit  Subjective:    Patient ID: Susan Whitaker, female    DOB: Mar 02, 1971, 53 y.o.   MRN: [id number]  Chief Complaint  Patient presents with   Back Pain    Right lower back pain x 2 month. Pt reports foamy ad frizzy urine x 2+ months. Urgency to void.  Pt states she had been concerns with her kidneys function for a while.  Pt states she has had abdominal pain but thought it was due to her past GI issues.    HPI She also presents with recurrent foamy urine and frequency. No dysuria, no hematuria. UA and urine culture completed 07/2023: positive protein and glucose. urine culture-minimal bacteria growth Current use of invokanna and normal UACr. eGFr at 84.79  HTN (hypertension) Out of losartan and spironolactone x 19month Current use of amlodipine only BP at goal BP Readings from Last 3 Encounters:  08/30/23 118/78  08/16/23 122/70  07/28/23 (!) 141/79    Advised to monitor BP at home 2-3x/week in AM Maintain only amlodipine 10mg  at this time She is to call office if BP >140/80 F/up in 3months  Chronic right-sided low back pain without sciatica Acute on chronic, worse in last 2months, describes as dull ache, mild and constant,  no other associated symptoms or known trigger, no rash, no radiating pain, no change in GI/GU function, denies any injury or previous surgery.  Suspect muscle strain Advised to use tylenol as needed for pain Start stretching exercise at home. She plans to joins a yoga class If no improvement in 4weeks, enter referral to physical therapy.  Outpatient Medications Prior to Visit  Medication Sig   acetaminophen (TYLENOL) 325 MG tablet Take 2 tablets (650 mg total) by mouth every 4 (four) hours as needed for headache or mild pain.   amLODipine (NORVASC) 10 MG tablet Take 1 tablet (10 mg total) by mouth daily.   atorvastatin (LIPITOR) 40 MG tablet Take 1 tablet (40 mg total) by mouth daily.   blood glucose meter kit and supplies Use in the  morning, afternoon, and at bedtime.   canagliflozin (INVOKANA) 300 MG TABS tablet Take 1 tablet (300 mg total) by mouth daily before breakfast.   Continuous Glucose Sensor (DEXCOM G7 SENSOR) MISC Apply 1 sensor as directed and replace every 10 days.   Continuous Glucose Sensor (FREESTYLE LIBRE 3 PLUS SENSOR) MISC Apply 1 sensor as directed and replace every 15 days.   glucose blood (ACCU-CHEK GUIDE) test strip Use as instructed to check once daily.   Glucose Blood (BLOOD GLUCOSE TEST STRIPS) STRP Use 1 each daily to test blood glucose levels.   levonorgestrel (MIRENA, 52 MG,) 20 MCG/DAY IUD Mirena 20 mcg/24 hours (7 yrs) 52 mg intrauterine device  Take by intrauterine route.   ONETOUCH DELICA LANCETS FINE MISC Use to check blood sugar 3 times daily   polyethylene glycol (MIRALAX / GLYCOLAX) 17 g packet Take 17 g by mouth once a week.   Probiotic Product (ULTRAFLORA IMMUNE HEALTH PO) Take 1 capsule by mouth daily.   tirzepatide (MOUNJARO) 7.5 MG/0.5ML Pen Inject 7.5 mg into the skin once a week.   [DISCONTINUED] potassium chloride SA (KLOR-CON M) 20 MEQ tablet Take 1 tablet (20 mEq total) by mouth daily.   pantoprazole (PROTONIX) 40 MG tablet Take 1 tablet (40 mg total) by mouth daily. (Patient not taking: Reported on 08/30/2023)   [DISCONTINUED] baclofen (LIORESAL) 10 MG tablet Take 1 tablet (10 mg total) by mouth 3 (three) times  daily. (Patient not taking: Reported on 08/30/2023)   [DISCONTINUED] losartan (COZAAR) 100 MG tablet Take 1 tablet (100 mg total) by mouth daily. (Patient not taking: Reported on 08/30/2023)   [DISCONTINUED] oxyCODONE-acetaminophen (PERCOCET/ROXICET) 5-325 MG tablet Take 1 tablet by mouth every 6 (six) hours as needed. (Patient not taking: Reported on 08/30/2023)   [DISCONTINUED] spironolactone (ALDACTONE) 100 MG tablet Take 1 tablet by mouth once daily (Patient not taking: Reported on 08/30/2023)   No facility-administered medications prior to visit.    Reviewed past  medical and social history.  Review of Systems Per HPI     Objective:    Physical Exam Vitals and nursing note reviewed.  Constitutional:      General: She is not in acute distress. Abdominal:     General: Bowel sounds are normal. There is no distension.     Palpations: Abdomen is soft.     Tenderness: There is no right CVA tenderness, left CVA tenderness or guarding.  Skin:    Findings: No erythema or rash.  Neurological:     Mental Status: She is alert and oriented to person, place, and time.    BP 118/78 (Cuff Size: Normal)   Pulse 85   Temp (!) 97.3 F (36.3 C) (Temporal)   Ht 5\' 3"  (1.6 m)   Wt 176 lb 9.6 oz (80.1 kg)   SpO2 98%   BMI 31.28 kg/m    Results for orders placed or performed in visit on 08/30/23  POCT Urinalysis Dipstick  Result Value Ref Range   Color, UA yellow    Clarity, UA cloudy    Glucose, UA Positive (A) Negative   Bilirubin, UA neg    Ketones, UA neg    Spec Grav, UA 1.015 1.010 - 1.025   Blood, UA 2+    pH, UA 6.0 5.0 - 8.0   Protein, UA Positive (A) Negative   Urobilinogen, UA 0.2 0.2 or 1.0 E.U./dL   Nitrite, UA neg    Leukocytes, UA Negative Negative   Appearance     Odor        Assessment & Plan:   Problem List Items Addressed This Visit     Chronic right-sided low back pain without sciatica - Primary   Acute on chronic, worse in last 2months, describes as dull ache, mild and constant,  no other associated symptoms or known trigger, no rash, no radiating pain, no change in GI/GU function, denies any injury or previous surgery.  Suspect muscle strain Advised to use tylenol as needed for pain Start stretching exercise at home. She plans to joins a yoga class If no improvement in 4weeks, enter referral to physical therapy.      HTN (hypertension)   Out of losartan and spironolactone x 65month Current use of amlodipine only BP at goal BP Readings from Last 3 Encounters:  08/30/23 118/78  08/16/23 122/70  07/28/23 (!)  141/79    Advised to monitor BP at home 2-3x/week in AM Maintain only amlodipine 10mg  at this time She is to call office if BP >140/80 F/up in 3months      Other Visit Diagnoses       Urinary frequency       Relevant Orders   POCT Urinalysis Dipstick (Completed)   Urine Culture      No orders of the defined types were placed in this encounter.   Return in about 3 months (around 11/29/2023) for HTN.  Alysia Penna, NP

## 2023-08-31 LAB — URINE CULTURE
MICRO NUMBER:: 16331949
SPECIMEN QUALITY:: ADEQUATE

## 2023-09-01 ENCOUNTER — Encounter: Payer: Self-pay | Admitting: Nurse Practitioner

## 2023-09-13 ENCOUNTER — Other Ambulatory Visit (HOSPITAL_BASED_OUTPATIENT_CLINIC_OR_DEPARTMENT_OTHER): Payer: Self-pay

## 2023-09-14 ENCOUNTER — Other Ambulatory Visit (HOSPITAL_BASED_OUTPATIENT_CLINIC_OR_DEPARTMENT_OTHER): Payer: Self-pay

## 2023-09-14 ENCOUNTER — Ambulatory Visit: Payer: 59 | Admitting: Nurse Practitioner

## 2023-09-14 ENCOUNTER — Ambulatory Visit
Admission: RE | Admit: 2023-09-14 | Discharge: 2023-09-14 | Disposition: A | Discharge status: Home / Self Care | Source: Ambulatory Visit | Attending: Nurse Practitioner

## 2023-09-14 ENCOUNTER — Other Ambulatory Visit: Payer: 59

## 2023-09-14 ENCOUNTER — Encounter: Payer: Self-pay | Admitting: Nurse Practitioner

## 2023-09-14 ENCOUNTER — Other Ambulatory Visit: Payer: Self-pay

## 2023-09-14 ENCOUNTER — Other Ambulatory Visit (HOSPITAL_COMMUNITY): Payer: Self-pay

## 2023-09-14 VITALS — BP 128/70 | HR 85 | Temp 97.7°F | Ht 62.0 in | Wt 178.8 lb

## 2023-09-14 DIAGNOSIS — M79601 Pain in right arm: Secondary | ICD-10-CM | POA: Diagnosis not present

## 2023-09-14 DIAGNOSIS — E785 Hyperlipidemia, unspecified: Secondary | ICD-10-CM | POA: Diagnosis not present

## 2023-09-14 DIAGNOSIS — E1169 Type 2 diabetes mellitus with other specified complication: Secondary | ICD-10-CM | POA: Diagnosis not present

## 2023-09-14 DIAGNOSIS — I10 Essential (primary) hypertension: Secondary | ICD-10-CM | POA: Diagnosis not present

## 2023-09-14 LAB — MICROALBUMIN / CREATININE URINE RATIO
Creatinine,U: 79.3 mg/dL
Microalb Creat Ratio: 16.4 mg/g (ref 0.0–30.0)
Microalb, Ur: 1.3 mg/dL (ref 0.0–1.9)

## 2023-09-14 MED ORDER — NAPROXEN 500 MG PO TABS: 500.0000 mg | ORAL_TABLET | Freq: Two times a day (BID) | ORAL | Status: DC

## 2023-09-14 MED ORDER — METHOCARBAMOL 500 MG PO TABS
500.0000 mg | ORAL_TABLET | Freq: Two times a day (BID) | ORAL | 0 refills | Status: DC | PRN
  Filled 2023-09-14: qty 21, 11d supply, fill #0

## 2023-09-14 NOTE — Assessment & Plan Note (Signed)
 BP at goal with amlodipine  BP Readings from Last 3 Encounters:  09/14/23 128/70  08/30/23 118/78  08/16/23 122/70    Repeat BMP due to previous hypokalemia Maintain med dose F/up in 6months

## 2023-09-14 NOTE — Progress Notes (Signed)
 Established Patient Visit  Patient: Susan Whitaker   DOB: 03-18-1971   53 y.o. Female  MRN: [id number] Visit Date: 09/14/2023  Subjective:    Chief Complaint  Patient presents with   Follow-up   Arm Pain    Right arm pain    HPI HTN (hypertension) BP at goal with amlodipine  BP Readings from Last 3 Encounters:  09/14/23 128/70  08/30/23 118/78  08/16/23 122/70    Repeat BMP due to previous hypokalemia Maintain med dose F/up in 6months  Hyperlipidemia associated with type 2 diabetes mellitus (HCC) Repeat lipid panel Maintain atorvastatin  dose  Right arm pain Onset 3weeks ago post attempt to start IV for CT scan. She states she was stuck 2 times in right upper arm and 1time in radial region. All sticks were unsuccessful. She now developed difficulty with opening bottles with right hand, intermittent muscle spasms, and intermittent pain in right arm as severe as 7-8/10. She denies any neck pain, no axilla pain or lymphadenopathy  Exam physical exam today and unable to reproduce pain. Get humerus x-ray Advised to use naproxen  or tylenol  for pain management. Use robaxin  at hs for muscle spasm.  Reviewed medical, surgical, and social history today  Medications: Outpatient Medications Prior to Visit  Medication Sig   acetaminophen  (TYLENOL ) 325 MG tablet Take 2 tablets (650 mg total) by mouth every 4 (four) hours as needed for headache or mild pain.   amLODipine  (NORVASC ) 10 MG tablet Take 1 tablet (10 mg total) by mouth daily.   atorvastatin  (LIPITOR) 40 MG tablet Take 1 tablet (40 mg total) by mouth daily.   blood glucose meter kit and supplies Use in the morning, afternoon, and at bedtime.   canagliflozin  (INVOKANA ) 300 MG TABS tablet Take 1 tablet (300 mg total) by mouth daily before breakfast.   glucose blood (ACCU-CHEK GUIDE) test strip Use as instructed to check once daily.   Glucose Blood (BLOOD GLUCOSE TEST STRIPS) STRP Use 1 each daily to test blood  glucose levels.   levonorgestrel  (MIRENA , 52 MG,) 20 MCG/DAY IUD Mirena  20 mcg/24 hours (7 yrs) 52 mg intrauterine device  Take by intrauterine route.   ONETOUCH DELICA LANCETS FINE MISC Use to check blood sugar 3 times daily   pantoprazole  (PROTONIX ) 40 MG tablet Take 1 tablet (40 mg total) by mouth daily.   polyethylene glycol (MIRALAX  / GLYCOLAX ) 17 g packet Take 17 g by mouth once a week.   Probiotic Product (ULTRAFLORA IMMUNE HEALTH PO) Take 1 capsule by mouth daily.   tirzepatide  (MOUNJARO ) 7.5 MG/0.5ML Pen Inject 7.5 mg into the skin once a week.   [DISCONTINUED] Continuous Glucose Sensor (DEXCOM G7 SENSOR) MISC Apply 1 sensor as directed and replace every 10 days.   [DISCONTINUED] Continuous Glucose Sensor (FREESTYLE LIBRE 3 PLUS SENSOR) MISC Apply 1 sensor as directed and replace every 15 days.   naloxone (NARCAN) nasal spray 4 mg/0.1 mL ADMINISTER A SINGLE SPRAY IN ONE NOSTRIL UPON SIGNS OF OPIOID OVERDOSE. CALL 911. REPEAT AFTER 3 MINUTES IF NO RESPONSE.   Oxycodone  HCl 10 MG TABS Take 10 mg by mouth 2 (two) times daily as needed.   No facility-administered medications prior to visit.   Reviewed past medical and social history.   ROS per HPI above      Objective:  BP 128/70 (BP Location: Left Arm, Patient Position: Sitting, Cuff Size: Large)   Pulse 85   Temp  97.7 F (36.5 C) (Temporal)   Ht 5\' 2"  (1.575 m)   Wt 178 lb 12.8 oz (81.1 kg)   SpO2 98%   BMI 32.70 kg/m      Physical Exam Vitals and nursing note reviewed.  Cardiovascular:     Rate and Rhythm: Normal rate and regular rhythm.     Pulses: Normal pulses.     Heart sounds: Normal heart sounds.  Pulmonary:     Effort: Pulmonary effort is normal.     Breath sounds: Normal breath sounds.  Musculoskeletal:     Right shoulder: Normal.     Right upper arm: Normal.     Right forearm: Normal.     Right wrist: Normal.     Right hand: Normal.     Right lower leg: No edema.     Left lower leg: No edema.   Neurological:     Mental Status: She is alert and oriented to person, place, and time.     No results found for any visits on 09/14/23.    Assessment & Plan:    Problem List Items Addressed This Visit     HTN (hypertension)   BP at goal with amlodipine  BP Readings from Last 3 Encounters:  09/14/23 128/70  08/30/23 118/78  08/16/23 122/70    Repeat BMP due to previous hypokalemia Maintain med dose F/up in 6months      Relevant Orders   Renal Function Panel   Hyperlipidemia associated with type 2 diabetes mellitus (HCC)   Repeat lipid panel Maintain atorvastatin  dose      Relevant Orders   Lipid panel   Right arm pain - Primary   Onset 3weeks ago post attempt to start IV for CT scan. She states she was stuck 2 times in right upper arm and 1time in radial region. All sticks were unsuccessful. She now developed difficulty with opening bottles with right hand, intermittent muscle spasms, and intermittent pain in right arm as severe as 7-8/10. She denies any neck pain, no axilla pain or lymphadenopathy  Exam physical exam today and unable to reproduce pain. Get humerus x-ray Advised to use naproxen  or tylenol  for pain management. Use robaxin  at hs for muscle spasm.      Relevant Medications   naproxen  (NAPROSYN ) 500 MG tablet   methocarbamol  (ROBAXIN ) 500 MG tablet   Other Relevant Orders   DG Humerus Right   Type 2 diabetes mellitus (HCC)   Relevant Orders   Renal Function Panel   Microalbumin / creatinine urine ratio   Return in about 6 months (around 03/15/2024) for HTN, DM, hyperlipidemia (fasting).     Kathrene Parents, NP

## 2023-09-14 NOTE — Patient Instructions (Signed)
 Go to lab for urine microalbumin today Schedule lab appointment for fasting blood draw. Alternate between tylenol  500mg  every 8hrs and naproxen  400mg  every 12hrs as needed for pain. Take meds with food Use muscle relaxant at bedtime due to risk of drowsiness. Go to [address] for x-ray.

## 2023-09-14 NOTE — Assessment & Plan Note (Signed)
 Onset 3weeks ago post attempt to start IV for CT scan. She states she was stuck 2 times in right upper arm and 1time in radial region. All sticks were unsuccessful. She now developed difficulty with opening bottles with right hand, intermittent muscle spasms, and intermittent pain in right arm as severe as 7-8/10. She denies any neck pain, no axilla pain or lymphadenopathy  Exam physical exam today and unable to reproduce pain. Get humerus x-ray Advised to use naproxen  or tylenol  for pain management. Use robaxin  at hs for muscle spasm.

## 2023-09-14 NOTE — Assessment & Plan Note (Signed)
 Repeat lipid panel Maintain atorvastatin dose

## 2023-09-15 ENCOUNTER — Other Ambulatory Visit (HOSPITAL_BASED_OUTPATIENT_CLINIC_OR_DEPARTMENT_OTHER): Payer: Self-pay

## 2023-09-15 ENCOUNTER — Other Ambulatory Visit

## 2023-09-19 ENCOUNTER — Encounter: Payer: Self-pay | Admitting: Nurse Practitioner

## 2023-09-19 ENCOUNTER — Ambulatory Visit: Payer: 59 | Admitting: Endocrinology

## 2023-09-20 ENCOUNTER — Encounter: Payer: Self-pay | Admitting: Endocrinology

## 2023-09-20 ENCOUNTER — Ambulatory Visit (INDEPENDENT_AMBULATORY_CARE_PROVIDER_SITE_OTHER): Admitting: Endocrinology

## 2023-09-20 VITALS — BP 122/74 | HR 79 | Resp 20 | Ht 62.0 in | Wt 177.6 lb

## 2023-09-20 DIAGNOSIS — E7800 Pure hypercholesterolemia, unspecified: Secondary | ICD-10-CM | POA: Diagnosis not present

## 2023-09-20 DIAGNOSIS — E1169 Type 2 diabetes mellitus with other specified complication: Secondary | ICD-10-CM

## 2023-09-20 DIAGNOSIS — Z7985 Long-term (current) use of injectable non-insulin antidiabetic drugs: Secondary | ICD-10-CM | POA: Diagnosis not present

## 2023-09-20 DIAGNOSIS — E669 Obesity, unspecified: Secondary | ICD-10-CM

## 2023-09-20 LAB — POCT GLYCOSYLATED HEMOGLOBIN (HGB A1C): Hemoglobin A1C: 5.4 % (ref 4.0–5.6)

## 2023-09-20 NOTE — Progress Notes (Unsigned)
 Outpatient Endocrinology Note [place] Jennaya Pogue, MD   Patient's Name: Susan Whitaker    DOB: January 26, 1971    MRN: [id number]                                                    REASON OF VISIT: Follow up for type 2 diabetes mellitus  PCP: Nche, Connye Delaine, NP  HISTORY OF PRESENT ILLNESS:   Susan Whitaker is a 53 y.o. old female with past medical history listed below, is here for follow up for type 2 diabetes mellitus.   Pertinent Diabetes History: Patient was previously seen by Dr. Hubert Madden and was last time seen in July 2024.  Patient was diagnosed with type 2 diabetes mellitus in 2013.  Patient was diagnosed with diabetes when she was having workup for fibroid surgery, baseline A1c was 6.9%.  She has controlled type 2 diabetes mellitus.  Chronic Diabetes Complications : Retinopathy: no. Last ophthalmology exam was done on 04/2023, following with ophthalmology regularly.  Nephropathy: no, on ACE/ARB /losartan . Peripheral neuropathy: no Coronary artery disease: no Stroke: no  Relevant comorbidities and cardiovascular risk factors: Obesity: yes Body mass index is 32.48 kg/m.  Hypertension: Yes  Hyperlipidemia : Yes, on statin   Current / Home Diabetic regimen includes:  Mounjaro  7.5 mg weekly. Invokana  300 mg daily.  Prior diabetic medications: Metformin  is stopped due to diarrhea.  She had taken glipizide  in the past.  Glycemic data:   She has not been checking blood sugar at home.  Hypoglycemia: Patient has no hypoglycemic episodes. Patient has hypoglycemia awareness.  Factors modifying glucose control: 1.  Diabetic diet assessment: 3 meals a day.  2.  Staying active or exercising: Walking.  3.  Medication compliance: compliant all of the time.  Interval history  Diabetes regimen as noted above.  She has not been monitoring blood sugar at home.  She has been tolerating Mounjaro  well.  Denies any GI issues.  No numbness and tingling of the feet or vision problem.  No other  complaints today.  REVIEW OF SYSTEMS As per history of present illness.   PAST MEDICAL HISTORY: Past Medical History:  Diagnosis Date   Abdominal bloating 09/14/2020   Abdominal pain 10/04/2020   Class 2 obesity due to excess calories with body mass index (BMI) of 35.0 to 35.9 in adult 06/2010   DM type 2 (diabetes mellitus, type 2) (HCC)    Gastric ulcer    GERD (gastroesophageal reflux disease)    H/O constipation    H/O hemorrhoids    History of small bowel obstruction 06/02/2019   Hyperlipidemia    Hypertension    Hypokalemia 2019   Menorrhagia    Microalbuminuria 02/15/2012   Primary hyperaldosteronism (HCC) 02/21/2012   S/P radiofrequency ablation operation for arrhythmia 10/03/20 10/04/2020   SVT (supraventricular tachycardia) (HCC)    Noted 11/2011 admission   SVT (supraventricular tachycardia) (HCC) 12/29/2011   Thrombocytopenia (HCC)    Type 2 diabetes mellitus with obesity (HCC) 11/17/2020   IMO SNOMED Dx Update Oct 2024     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    PAST SURGICAL HISTORY: Past Surgical History:  Procedure Laterality Date   ABDOMINAL ADHESION SURGERY  06/03/2019   Dr Alethea Andes   ABDOMINAL SURGERY     74 months of age-- unsure of type of surgery   COLONOSCOPY  04/2016   hemorrhoids--normal per pt with Dr Tova Fresh   DILATATION & CURETTAGE/HYSTEROSCOPY WITH MYOSURE N/A 02/08/2020   Procedure: DILATATION & CURETTAGE/HYSTEROSCOPY WITH MYOSURE;  Surgeon: Vernal Gold, MD;  Location: [facility];  Service: Gynecology;  Laterality: N/A;   HEMORRHOID SURGERY  2005/2006   INTRAUTERINE DEVICE (IUD) INSERTION N/A 02/08/2020   Procedure: INTRAUTERINE DEVICE (IUD) INSERTION UNDER ULTRASOUND GUIDANCE;  Surgeon: Vernal Gold, MD;  Location: [facility];  Service: Gynecology;  Laterality: N/A;  Mirena    LAPAROTOMY N/A 06/03/2019   Procedure: EXPLORATORY LAPAROTOMY WITH LYSIS OF ADHESIONS;  Surgeon: Caralyn Chandler, MD;  Location: [facility];  Service: General;   Laterality: N/A;   SVT ABLATION N/A 10/03/2020   Procedure: SVT ABLATION;  Surgeon: Tammie Fall, MD;  Location: [facility];  Service: Cardiovascular;  Laterality: N/A;   UTERINE FIBROID EMBOLIZATION  2010   at [facility]    ALLERGIES: Allergies  Allergen Reactions   Lisinopril Cough    FAMILY HISTORY:  Family History  Problem Relation Age of Onset   Cancer Mother 108       breast   Stroke Mother    Vision loss Father        some   Heart disease Father        Rhythm disturbance   Hypertension Sister    Hypertension Brother    Hypertension Brother    Cancer Maternal Grandmother 83       breast   Hypertension Maternal Grandmother    Cancer Other        breast, lung   Hypertension Other    Stroke Other    Cancer Maternal Aunt 50       Breast   Diabetes Neg Hx     SOCIAL HISTORY: Social History   Socioeconomic History   Marital status: Single    Spouse name: Not on file   Number of children: 2   Years of education: Not on file   Highest education level: Not on file  Occupational History   Occupation: YOUTH EMPLOYMENT    Employer: [employer]   Occupation: egineering  Tobacco Use   Smoking status: Never   Smokeless tobacco: Never  Vaping Use   Vaping status: Never Used  Substance and Sexual Activity   Alcohol  use: No   Drug use: No   Sexual activity: Not on file  Other Topics Concern   Not on file  Social History Narrative   Works at [employer]         Social Drivers of Corporate investment banker Strain: Not on file  Food Insecurity: Not on file  Transportation Needs: Not on file  Physical Activity: Not on file  Stress: Not on file  Social Connections: Not on file    MEDICATIONS:  Current Outpatient Medications  Medication Sig Dispense Refill   acetaminophen  (TYLENOL ) 325 MG tablet Take 2 tablets (650 mg total) by mouth every 4 (four) hours as needed for headache or mild pain.     amLODipine  (NORVASC ) 10 MG tablet Take 1 tablet  (10 mg total) by mouth daily. 90 tablet 3   blood glucose meter kit and supplies Use in the morning, afternoon, and at bedtime. 1 each 0   canagliflozin  (INVOKANA ) 300 MG TABS tablet Take 1 tablet (300 mg total) by mouth daily before breakfast. 90 tablet 4   glucose blood (ACCU-CHEK GUIDE) test strip Use as instructed to check once daily. 50 each 12  Glucose Blood (BLOOD GLUCOSE TEST STRIPS) STRP Use 1 each daily to test blood glucose levels. 100 each 3   levonorgestrel  (MIRENA , 52 MG,) 20 MCG/DAY IUD Mirena  20 mcg/24 hours (7 yrs) 52 mg intrauterine device  Take by intrauterine route.     methocarbamol  (ROBAXIN ) 500 MG tablet Take 1 tablet (500 mg total) by mouth 2 (two) times daily as needed for muscle spasms. 21 tablet 0   naloxone (NARCAN) nasal spray 4 mg/0.1 mL ADMINISTER A SINGLE SPRAY IN ONE NOSTRIL UPON SIGNS OF OPIOID OVERDOSE. CALL 911. REPEAT AFTER 3 MINUTES IF NO RESPONSE.     naproxen  (NAPROSYN ) 500 MG tablet Take 1 tablet (500 mg total) by mouth 2 (two) times daily with a meal.     ONETOUCH DELICA LANCETS FINE MISC Use to check blood sugar 3 times daily 100 each 3   Oxycodone  HCl 10 MG TABS Take 10 mg by mouth 2 (two) times daily as needed.     pantoprazole  (PROTONIX ) 40 MG tablet Take 1 tablet (40 mg total) by mouth daily. 90 tablet 3   polyethylene glycol (MIRALAX  / GLYCOLAX ) 17 g packet Take 17 g by mouth once a week.     Probiotic Product (ULTRAFLORA IMMUNE HEALTH PO) Take 1 capsule by mouth daily.     tirzepatide  (MOUNJARO ) 7.5 MG/0.5ML Pen Inject 7.5 mg into the skin once a week. 6 mL 3   atorvastatin  (LIPITOR) 80 MG tablet Take 1 tablet (80 mg total) by mouth daily. 90 tablet 3   No current facility-administered medications for this visit.    PHYSICAL EXAM: Vitals:   09/20/23 1326  BP: 122/74  Pulse: 79  Resp: 20  SpO2: 99%  Weight: 177 lb 9.6 oz (80.6 kg)  Height: 5\' 2"  (1.575 m)   Body mass index is 32.48 kg/m.  Wt Readings from Last 3 Encounters:  09/20/23  177 lb 9.6 oz (80.6 kg)  09/14/23 178 lb 12.8 oz (81.1 kg)  08/30/23 176 lb 9.6 oz (80.1 kg)    General: Well developed, well nourished female in no apparent distress.  HEENT: AT/[place], no external lesions.  Eyes: Conjunctiva clear and no icterus. Neck: Neck supple  Lungs: Respirations not labored Neurologic: Alert, oriented, normal speech Extremities / Skin: Dry.  Psychiatric: Does not appear depressed or anxious  Diabetic Foot Exam - Simple   No data filed    LABS Reviewed Lab Results  Component Value Date   HGBA1C 5.4 09/20/2023   HGBA1C 5.9 (H) 05/06/2023   HGBA1C 5.9 03/16/2023   Lab Results  Component Value Date   FRUCTOSAMINE 241 05/03/2018   FRUCTOSAMINE 273 08/01/2017   FRUCTOSAMINE 275 10/16/2015   Lab Results  Component Value Date   CHOL 195 09/20/2023   HDL 59 09/20/2023   LDLCALC 122 (H) 09/20/2023   LDLDIRECT 158.0 08/05/2022   TRIG 57 09/20/2023   CHOLHDL 3.3 09/20/2023   Lab Results  Component Value Date   MICRALBCREAT 16.4 09/14/2023   MICRALBCREAT 0.6 12/10/2022   Lab Results  Component Value Date   CREATININE 0.79 08/16/2023   Lab Results  Component Value Date   GFR 84.79 08/16/2023    ASSESSMENT / PLAN  1. Type 2 diabetes mellitus with obesity (HCC)   2. Pure hypercholesterolemia     Diabetes Mellitus type 2, complicated by no known complications. - Diabetic status / severity: Controlled.  Lab Results  Component Value Date   HGBA1C 5.4 09/20/2023    - Hemoglobin A1c goal : <6.5%  Discussed about increasing dose of Mounjaro  to have weight loss benefit.  Patient wants to stay on the current dose.  - Medications: No change.  I) continue Mounjaro  7.5 mg weekly. II) continue Invokana  300 mg daily.  - Home glucose testing: Few times a week at different times of the day.  - Discussed/ Gave Hypoglycemia treatment plan.  # Consult : not required at this time.   # Annual urine for microalbuminuria/ creatinine ratio, no  microalbuminuria currently, continue ACE/ARB /losartan . Last  Lab Results  Component Value Date   MICRALBCREAT 16.4 09/14/2023    # Foot check nightly.  # Annual dilated diabetic eye exams.   - Diet: Make healthy diabetic food choices - Life style / activity / exercise: Discussed.  2. Blood pressure  -  BP Readings from Last 1 Encounters:  09/20/23 122/74    - Control is in target.  - No change in current plans.  3. Lipid status / Hyperlipidemia - Last  Lab Results  Component Value Date   LDLCALC 122 (H) 09/20/2023   - Continue atorvastatin  40 mg daily.  Will check lab today.  Diagnoses and all orders for this visit:  Type 2 diabetes mellitus with obesity (HCC) -     POCT glycosylated hemoglobin (Hb A1C)  Pure hypercholesterolemia -     Lipid panel -     atorvastatin  (LIPITOR) 80 MG tablet; Take 1 tablet (80 mg total) by mouth daily.    DISPOSITION Follow up in clinic in 4  months suggested.    All questions answered and patient verbalized understanding of the plan.  [place] Heath Badon, MD [facility] Endocrinology [facility] [address] [place], [address] [place], [place] [address] Phone # [phone]  At least part of this note was generated using voice recognition software. Inadvertent word errors may have occurred, which were not recognized during the proofreading process.

## 2023-09-21 ENCOUNTER — Other Ambulatory Visit (HOSPITAL_BASED_OUTPATIENT_CLINIC_OR_DEPARTMENT_OTHER): Payer: Self-pay

## 2023-09-21 LAB — LIPID PANEL
Cholesterol: 195 mg/dL
HDL: 59 mg/dL
LDL Cholesterol (Calc): 122 mg/dL — ABNORMAL HIGH
Non-HDL Cholesterol (Calc): 136 mg/dL — ABNORMAL HIGH
Total CHOL/HDL Ratio: 3.3 (calc)
Triglycerides: 57 mg/dL

## 2023-09-21 MED ORDER — ATORVASTATIN CALCIUM 80 MG PO TABS
80.0000 mg | ORAL_TABLET | Freq: Every day | ORAL | 3 refills | Status: AC
  Filled 2023-09-21 – 2023-11-01 (×3): qty 30, 30d supply, fill #0
  Filled 2024-01-19: qty 30, 30d supply, fill #1
  Filled 2024-02-22: qty 30, 30d supply, fill #2
  Filled 2024-03-29: qty 30, 30d supply, fill #3
  Filled 2024-04-19 – 2024-04-23 (×2): qty 30, 30d supply, fill #4
  Filled 2024-06-01: qty 30, 30d supply, fill #5

## 2023-09-29 ENCOUNTER — Encounter: Payer: Self-pay | Admitting: Gastroenterology

## 2023-09-29 ENCOUNTER — Encounter: Payer: Self-pay | Admitting: Nurse Practitioner

## 2023-10-03 ENCOUNTER — Other Ambulatory Visit (HOSPITAL_BASED_OUTPATIENT_CLINIC_OR_DEPARTMENT_OTHER): Payer: Self-pay

## 2023-10-07 ENCOUNTER — Other Ambulatory Visit (HOSPITAL_BASED_OUTPATIENT_CLINIC_OR_DEPARTMENT_OTHER): Payer: Self-pay

## 2023-10-07 ENCOUNTER — Other Ambulatory Visit: Payer: Self-pay

## 2023-10-13 ENCOUNTER — Encounter: Admitting: Gastroenterology

## 2023-10-13 ENCOUNTER — Telehealth: Payer: Self-pay | Admitting: Gastroenterology

## 2023-10-13 NOTE — Telephone Encounter (Signed)
 Hi Dr Venice Gillis,   I called patient regarding her procedure this morning she stated she has been sick and she's unable to come in and she also texted NO to the automated reminder. Patient said she will call back to reschedule on her available time. I will no show her for today's procedure.   Thank you

## 2023-10-17 ENCOUNTER — Other Ambulatory Visit (HOSPITAL_BASED_OUTPATIENT_CLINIC_OR_DEPARTMENT_OTHER): Payer: Self-pay

## 2023-10-24 ENCOUNTER — Telehealth: Payer: Self-pay | Admitting: Gastroenterology

## 2023-10-24 NOTE — Telephone Encounter (Signed)
 Pt was scheduled for a endo on 10/13/23 and no showed.  Last OV was 08/16/2023.  Can I rescheduled direct endo or does patient need another OV?

## 2023-10-25 NOTE — Telephone Encounter (Signed)
 Can schedule directly RG

## 2023-11-01 ENCOUNTER — Other Ambulatory Visit (HOSPITAL_BASED_OUTPATIENT_CLINIC_OR_DEPARTMENT_OTHER): Payer: Self-pay

## 2023-11-02 ENCOUNTER — Other Ambulatory Visit (HOSPITAL_BASED_OUTPATIENT_CLINIC_OR_DEPARTMENT_OTHER): Payer: Self-pay

## 2023-11-04 ENCOUNTER — Other Ambulatory Visit (HOSPITAL_COMMUNITY)

## 2023-11-11 ENCOUNTER — Ambulatory Visit (HOSPITAL_COMMUNITY)
Admission: RE | Admit: 2023-11-11 | Discharge: 2023-11-11 | Disposition: A | Discharge status: Home / Self Care | Source: Ambulatory Visit | Attending: Gastroenterology | Admitting: Gastroenterology

## 2023-11-11 ENCOUNTER — Telehealth: Payer: Self-pay

## 2023-11-11 ENCOUNTER — Encounter: Payer: Self-pay | Admitting: Gastroenterology

## 2023-11-11 ENCOUNTER — Encounter (HOSPITAL_COMMUNITY): Payer: Self-pay

## 2023-11-11 ENCOUNTER — Other Ambulatory Visit: Payer: Self-pay

## 2023-11-11 DIAGNOSIS — G8929 Other chronic pain: Secondary | ICD-10-CM | POA: Insufficient documentation

## 2023-11-11 DIAGNOSIS — K5909 Other constipation: Secondary | ICD-10-CM | POA: Insufficient documentation

## 2023-11-11 DIAGNOSIS — K3189 Other diseases of stomach and duodenum: Secondary | ICD-10-CM

## 2023-11-11 DIAGNOSIS — R109 Unspecified abdominal pain: Secondary | ICD-10-CM | POA: Diagnosis present

## 2023-11-11 DIAGNOSIS — R9389 Abnormal findings on diagnostic imaging of other specified body structures: Secondary | ICD-10-CM

## 2023-11-11 DIAGNOSIS — R112 Nausea with vomiting, unspecified: Secondary | ICD-10-CM | POA: Insufficient documentation

## 2023-11-11 LAB — POCT I-STAT CREATININE: Creatinine, Ser: 0.9 mg/dL (ref 0.44–1.00)

## 2023-11-11 MED ORDER — IOHEXOL 300 MG/ML  SOLN
125.0000 mL | Freq: Once | INTRAMUSCULAR | Status: AC | PRN
  Administered 2023-11-11: 100 mL via INTRAVENOUS

## 2023-11-11 MED ORDER — IOHEXOL 9 MG/ML PO SOLN
ORAL | Status: AC
  Filled 2023-11-11: qty 500

## 2023-11-11 MED ORDER — SODIUM CHLORIDE (PF) 0.9 % IJ SOLN
INTRAMUSCULAR | Status: AC
  Filled 2023-11-11: qty 50

## 2023-11-11 NOTE — Telephone Encounter (Signed)
 Dr. Charlanne Pt.  Dianne from [facility] called with report from CT entero Abd/Pelvis with contrast.   MPRESSION: 1. Distal gastric mass, worrisome for adenocarcinoma. These results will be called to the ordering clinician or representative by the Radiologist Assistant, and communication documented in the PACS or [employer]. 2.  Aortic atherosclerosis (ICD10-I70.0).   Please review and advise

## 2023-11-11 NOTE — Telephone Encounter (Signed)
 Pt made aware of Alan Coombs PA recommendations.  Upper Endoscopy was scheduled for 11/17/2023 at 9:00 with Dr. Charlanne. Pt made aware. Pt notified to not take her Mounjaro  7 days prior. Ambulatory referral to GI placed in Epic.  Prep instructions were sent to pt via my chart. Pt made aware. Pt verbalized understanding with all questions answered.

## 2023-11-17 ENCOUNTER — Ambulatory Visit: Payer: Self-pay | Admitting: Nurse Practitioner

## 2023-11-17 ENCOUNTER — Ambulatory Visit: Payer: Self-pay | Admitting: Gastroenterology

## 2023-11-17 ENCOUNTER — Ambulatory Visit (AMBULATORY_SURGERY_CENTER): Admitting: Gastroenterology

## 2023-11-17 ENCOUNTER — Other Ambulatory Visit

## 2023-11-17 ENCOUNTER — Encounter: Payer: Self-pay | Admitting: Gastroenterology

## 2023-11-17 VITALS — BP 108/72 | HR 78 | Temp 97.9°F | Resp 13 | Ht 63.0 in | Wt 179.0 lb

## 2023-11-17 DIAGNOSIS — K2940 Chronic atrophic gastritis without bleeding: Secondary | ICD-10-CM

## 2023-11-17 DIAGNOSIS — K3189 Other diseases of stomach and duodenum: Secondary | ICD-10-CM

## 2023-11-17 DIAGNOSIS — G8929 Other chronic pain: Secondary | ICD-10-CM

## 2023-11-17 DIAGNOSIS — E1169 Type 2 diabetes mellitus with other specified complication: Secondary | ICD-10-CM | POA: Diagnosis not present

## 2023-11-17 DIAGNOSIS — E785 Hyperlipidemia, unspecified: Secondary | ICD-10-CM | POA: Diagnosis not present

## 2023-11-17 DIAGNOSIS — I10 Essential (primary) hypertension: Secondary | ICD-10-CM

## 2023-11-17 DIAGNOSIS — D3A092 Benign carcinoid tumor of the stomach: Secondary | ICD-10-CM

## 2023-11-17 DIAGNOSIS — K449 Diaphragmatic hernia without obstruction or gangrene: Secondary | ICD-10-CM | POA: Diagnosis not present

## 2023-11-17 DIAGNOSIS — R112 Nausea with vomiting, unspecified: Secondary | ICD-10-CM | POA: Diagnosis not present

## 2023-11-17 DIAGNOSIS — R109 Unspecified abdominal pain: Secondary | ICD-10-CM

## 2023-11-17 DIAGNOSIS — D002 Carcinoma in situ of stomach: Secondary | ICD-10-CM

## 2023-11-17 LAB — CBC WITH DIFFERENTIAL/PLATELET
Basophils Absolute: 0.1 10*3/uL (ref 0.0–0.1)
Basophils Relative: 0.9 % (ref 0.0–3.0)
Eosinophils Absolute: 0.1 10*3/uL (ref 0.0–0.7)
Eosinophils Relative: 1.7 % (ref 0.0–5.0)
HCT: 41.3 % (ref 36.0–46.0)
Hemoglobin: 13.7 g/dL (ref 12.0–15.0)
Lymphocytes Relative: 35.5 % (ref 12.0–46.0)
Lymphs Abs: 2.5 10*3/uL (ref 0.7–4.0)
MCHC: 33.1 g/dL (ref 30.0–36.0)
MCV: 87.8 fl (ref 78.0–100.0)
Monocytes Absolute: 0.3 10*3/uL (ref 0.1–1.0)
Monocytes Relative: 4.7 % (ref 3.0–12.0)
Neutro Abs: 4 10*3/uL (ref 1.4–7.7)
Neutrophils Relative %: 57.2 % (ref 43.0–77.0)
Platelets: 385 10*3/uL (ref 150.0–400.0)
RBC: 4.7 Mil/uL (ref 3.87–5.11)
RDW: 13.5 % (ref 11.5–15.5)
WBC: 7 10*3/uL (ref 4.0–10.5)

## 2023-11-17 LAB — RENAL FUNCTION PANEL
Albumin: 4.4 g/dL (ref 3.5–5.2)
BUN: 14 mg/dL (ref 6–23)
CO2: 29 meq/L (ref 19–32)
Calcium: 9.1 mg/dL (ref 8.4–10.5)
Chloride: 104 meq/L (ref 96–112)
Creatinine, Ser: 0.86 mg/dL (ref 0.40–1.20)
GFR: 76.44 mL/min (ref >60.00)
Glucose, Bld: 109 mg/dL — ABNORMAL HIGH (ref 70–99)
Phosphorus: 3.2 mg/dL (ref 2.3–4.6)
Potassium: 3.6 meq/L (ref 3.5–5.1)
Sodium: 140 meq/L (ref 135–145)

## 2023-11-17 LAB — COMPREHENSIVE METABOLIC PANEL WITH GFR
ALT: 18 U/L (ref 0–35)
AST: 16 U/L (ref 0–37)
Albumin: 4.4 g/dL (ref 3.5–5.2)
Alkaline Phosphatase: 67 U/L (ref 39–117)
BUN: 14 mg/dL (ref 6–23)
CO2: 29 meq/L (ref 19–32)
Calcium: 9.1 mg/dL (ref 8.4–10.5)
Chloride: 104 meq/L (ref 96–112)
Creatinine, Ser: 0.86 mg/dL (ref 0.40–1.20)
GFR: 76.44 mL/min (ref >60.00)
Glucose, Bld: 109 mg/dL — ABNORMAL HIGH (ref 70–99)
Potassium: 3.6 meq/L (ref 3.5–5.1)
Sodium: 140 meq/L (ref 135–145)
Total Bilirubin: 0.4 mg/dL (ref 0.2–1.2)
Total Protein: 7.8 g/dL (ref 6.0–8.3)

## 2023-11-17 LAB — BASIC METABOLIC PANEL WITH GFR
BUN: 14 mg/dL (ref 6–23)
CO2: 29 meq/L (ref 19–32)
Calcium: 9.1 mg/dL (ref 8.4–10.5)
Chloride: 104 meq/L (ref 96–112)
Creatinine, Ser: 0.86 mg/dL (ref 0.40–1.20)
GFR: 76.44 mL/min (ref >60.00)
Glucose, Bld: 109 mg/dL — ABNORMAL HIGH (ref 70–99)
Potassium: 3.6 meq/L (ref 3.5–5.1)
Sodium: 140 meq/L (ref 135–145)

## 2023-11-17 LAB — LIPID PANEL
Cholesterol: 156 mg/dL (ref 0–200)
HDL: 47.6 mg/dL (ref >39.00)
LDL Cholesterol: 93 mg/dL (ref 0–99)
NonHDL: 108.14
Total CHOL/HDL Ratio: 3
Triglycerides: 75 mg/dL (ref 0.0–149.0)
VLDL: 15 mg/dL (ref 0.0–40.0)

## 2023-11-17 MED ORDER — SODIUM CHLORIDE 0.9 % IV SOLN: 500.0000 mL | Freq: Once | INTRAVENOUS | Status: DC

## 2023-11-17 NOTE — Progress Notes (Signed)
 Report to PACU, RN, vss, BBS= Clear.

## 2023-11-17 NOTE — Patient Instructions (Addendum)
 Resume previous diet Continue present medications-including Protonix  (Pantoprazole ) daily Do not use of  NSAIDS (Non-Steroidal anti-inflammatory drugs).  (These include, aspirin , aspirin -containing products(products containing salicylic acid like Pepto Bismol and Alka Seltzer), ibuprofen , advil , motrin , naproxen , aleve , goody powders, etc) Tylenol  is ok to take as needed, see label for instructions. Check Labs today CBC and CMP Await pathology results  Handouts/information given for Hiatal Hernia, Gastritis  YOU HAD AN ENDOSCOPIC PROCEDURE TODAY AT THE [facility]:   Refer to the procedure report that was given to you for any specific questions about what was found during the examination.  If the procedure report does not answer your questions, please call your gastroenterologist to clarify.  If you requested that your care partner not be given the details of your procedure findings, then the procedure report has been included in a sealed envelope for you to review at your convenience later.  YOU SHOULD EXPECT: Some feelings of bloating in the abdomen. Passage of more gas than usual.  Walking can help get rid of the air that was put into your GI tract during the procedure and reduce the bloating. If you had a lower endoscopy (such as a colonoscopy or flexible sigmoidoscopy) you may notice spotting of blood in your stool or on the toilet paper. If you underwent a bowel prep for your procedure, you may not have a normal bowel movement for a few days.  Please Note:  You might notice some irritation and congestion in your nose or some drainage.  This is from the oxygen used during your procedure.  There is no need for concern and it should clear up in a day or so.  SYMPTOMS TO REPORT IMMEDIATELY:  Following upper endoscopy (EGD)  Vomiting of blood or coffee ground material  New chest pain or pain under the shoulder blades  Painful or persistently difficult swallowing  New shortness of  breath  Fever of 100F or higher  Black, tarry-looking stools For urgent or emergent issues, a gastroenterologist can be reached at any hour by calling [phone]. Do not use MyChart messaging for urgent concerns.   DIET:  We do recommend a small meal at first, but then you may proceed to your regular diet.  Drink plenty of fluids but you should avoid alcoholic beverages for 24 hours.  ACTIVITY:  You should plan to take it easy for the rest of today and you should NOT DRIVE or use heavy machinery until tomorrow (because of the sedation medicines used during the test).    FOLLOW UP: Our staff will call the number listed on your records the next business day following your procedure.  We will call around 7:15- 8:00 am to check on you and address any questions or concerns that you may have regarding the information given to you following your procedure. If we do not reach you, we will leave a message.     If any biopsies were taken you will be contacted by phone or by letter within the next 5 days.  Please call us  at [phone] if you have not heard about the biopsies in 1 week.    SIGNATURES/CONFIDENTIALITY: You and/or your care partner have signed paperwork which will be entered into your electronic medical record.  These signatures attest to the fact that that the information above on your After Visit Summary has been reviewed and is understood.  Full responsibility of the confidentiality of this discharge information lies with you and/or your care-partner.

## 2023-11-17 NOTE — Progress Notes (Signed)
 Chief Complaint: Chronic abdominal pain  Referring Provider:  Katheen Roselie Rockford, NP      ASSESSMENT AND PLAN;   #1. Chronic abdo pain. H/O PSBO s/p X-lap jan 2021  #2. N/V/hearburn  #3. Chronic constipation (better with miralax ). Neg colon 04/2016  Plan: -CTE -Trial of protonix  40mg  po every day #90 -CBC, CMP, lipase, TSH, celiac, food allergy  testing -EGD (off Mounjaro  x 7 days) -If still with problems, solid phase GES followed by colonoscopy, if needed. -Continue MiraLAX .   CTE: 11/11/2023 1. Distal gastric mass, worrisome for adenocarcinoma.  2.  Aortic atherosclerosis (ICD10-I70.0).  For EGD today  HPI:    Susan Whitaker is a 53 y.o. female  Had X-lap as newborn (32-36 month old at [facility]) and then S/P X- lap with LOA 06/03/2019 for partial small bowel obstruction With multiple medical problems as below including DM on Mounjaro , HTN, T2DM, primary hypoaldosteronism, h/o recurrent SBO with first episode in infancy, s/p ex lap and lysis of adhesions (05/2019)   C/O chronic abdominal pain -has been to multiple gastroenterologists including [facility]   She has experienced persistent abdominal pain since undergoing surgery for a bowel obstruction on June 03, 2019. The pain is diffuse, particularly affecting the midsection and lower abdomen, with a daily pain level of 3 out of 10. She has been hospitalized multiple times post-surgery, with the last hospitalization in July 2022 due to severe pain. A consultation confirmed issues with her previous surgery, noting a partial small bowel obstruction. She has been under the care of a pain specialist since then.  She experiences frequent nausea and vomiting, which she associates with certain foods. She has adjusted her diet to avoid foods that exacerbate these symptoms. Heartburn is present but not severe and is managed with over-the-counter gummies.  Her bowel habits fluctuate between diarrhea and constipation. She uses yogurt and  Miralax  to manage constipation, though it does not alleviate the pain.  She has a history of bowel obstruction surgery as a newborn and again 50 years later. She recalls a colonoscopy in December 2017, which revealed small polyps, and she believes she has not had another since then.  No family history of colon cancer or polyps, but she plans to confirm with her father.  She avoids wearing belts or tight clothing due to abdominal discomfort and has adjusted her diet to manage nausea and vomiting.  No epistaxis or significant gum bleeding. Reports heartburn, nausea, vomiting, and fluctuating bowel habits. No recent endoscopy performed.      Wt Readings from Last 3 Encounters:  11/17/23 179 lb (81.2 kg)  09/20/23 177 lb 9.6 oz (80.6 kg)  09/14/23 178 lb 12.8 oz (81.1 kg)       Past GI workup:  Colonoscopy 05/03/2016 by Dr. Kristie -Colonic polyps status post polypectomy. Bx- Hyperplastic -Single sigmoid diverticula -Internal hemorrhoids  CT Abdo/pelvis 11/2020 Dilated loop small bowel in the right mid abdomen with transition, suggesting partial small bowel obstruction, likely on the basis of adhesions. No small bowel wall thickening or pneumatosis.  No free air.  Past Medical History:  Diagnosis Date   Abdominal bloating 09/14/2020   Abdominal pain 10/04/2020   Class 2 obesity due to excess calories with body mass index (BMI) of 35.0 to 35.9 in adult 06/2010   DM type 2 (diabetes mellitus, type 2) (HCC)    Gastric ulcer    GERD (gastroesophageal reflux disease)    H/O constipation    H/O hemorrhoids    History  of small bowel obstruction 06/02/2019   Hyperlipidemia    Hypertension    Hypokalemia 2019   Menorrhagia    Microalbuminuria 02/15/2012   Primary hyperaldosteronism (HCC) 02/21/2012   S/P radiofrequency ablation operation for arrhythmia 10/03/20 10/04/2020   SVT (supraventricular tachycardia) (HCC)    Noted 11/2011 admission   SVT (supraventricular tachycardia) (HCC)  12/29/2011   Thrombocytopenia (HCC)    Type 2 diabetes mellitus with obesity (HCC) 11/17/2020   IMO SNOMED Dx Update Oct 2024     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    Past Surgical History:  Procedure Laterality Date   ABDOMINAL ADHESION SURGERY  06/03/2019   Dr Curvin   ABDOMINAL SURGERY     69 months of age-- unsure of type of surgery   COLONOSCOPY  04/2016   hemorrhoids--normal per pt with Dr Kristie   DILATATION & CURETTAGE/HYSTEROSCOPY WITH MYOSURE N/A 02/08/2020   Procedure: DILATATION & CURETTAGE/HYSTEROSCOPY WITH MYOSURE;  Surgeon: Gloriann Chick, MD;  Location: [facility];  Service: Gynecology;  Laterality: N/A;   HEMORRHOID SURGERY  2005/2006   INTRAUTERINE DEVICE (IUD) INSERTION N/A 02/08/2020   Procedure: INTRAUTERINE DEVICE (IUD) INSERTION UNDER ULTRASOUND GUIDANCE;  Surgeon: Gloriann Chick, MD;  Location:  SURGERY CENTER;  Service: Gynecology;  Laterality: N/A;  Mirena    LAPAROTOMY N/A 06/03/2019   Procedure: EXPLORATORY LAPAROTOMY WITH LYSIS OF ADHESIONS;  Surgeon: Curvin Deward MOULD, MD;  Location: [facility];  Service: General;  Laterality: N/A;   SVT ABLATION N/A 10/03/2020   Procedure: SVT ABLATION;  Surgeon: Waddell Danelle ORN, MD;  Location: [facility];  Service: Cardiovascular;  Laterality: N/A;   UPPER GASTROINTESTINAL ENDOSCOPY     UTERINE FIBROID EMBOLIZATION  2010   at [facility]    Family History  Problem Relation Age of Onset   Cancer Mother 37       breast   Stroke Mother    Vision loss Father        some   Heart disease Father        Rhythm disturbance   Hypertension Sister    Hypertension Brother    Hypertension Brother    Cancer Maternal Aunt 7       Breast   Cancer Maternal Grandmother 29       breast   Hypertension Maternal Grandmother    Cancer Other        breast, lung   Hypertension Other    Stroke Other    Diabetes Neg Hx    Colon cancer Neg Hx    Esophageal cancer Neg Hx    Stomach cancer Neg Hx    Rectal cancer Neg Hx      Social History   Tobacco Use   Smoking status: Never   Smokeless tobacco: Never  Vaping Use   Vaping status: Never Used  Substance Use Topics   Alcohol  use: No   Drug use: No    Current Outpatient Medications  Medication Sig Dispense Refill   amLODipine  (NORVASC ) 10 MG tablet Take 1 tablet (10 mg total) by mouth daily. 90 tablet 3   atorvastatin  (LIPITOR) 80 MG tablet Take 1 tablet (80 mg total) by mouth daily. 90 tablet 3   blood glucose meter kit and supplies Use in the morning, afternoon, and at bedtime. 1 each 0   canagliflozin  (INVOKANA ) 300 MG TABS tablet Take 1 tablet (300 mg total) by mouth daily before breakfast. 90 tablet 4   glucose blood (ACCU-CHEK GUIDE) test strip Use as instructed to  check once daily. 50 each 12   Glucose Blood (BLOOD GLUCOSE TEST STRIPS) STRP Use 1 each daily to test blood glucose levels. 100 each 3   levonorgestrel  (MIRENA , 52 MG,) 20 MCG/DAY IUD Mirena  20 mcg/24 hours (7 yrs) 52 mg intrauterine device  Take by intrauterine route.     ONETOUCH DELICA LANCETS FINE MISC Use to check blood sugar 3 times daily 100 each 3   polyethylene glycol (MIRALAX  / GLYCOLAX ) 17 g packet Take 17 g by mouth once a week.     acetaminophen  (TYLENOL ) 325 MG tablet Take 2 tablets (650 mg total) by mouth every 4 (four) hours as needed for headache or mild pain.     methocarbamol  (ROBAXIN ) 500 MG tablet Take 1 tablet (500 mg total) by mouth 2 (two) times daily as needed for muscle spasms. (Patient not taking: Reported on 11/17/2023) 21 tablet 0   naloxone (NARCAN) nasal spray 4 mg/0.1 mL ADMINISTER A SINGLE SPRAY IN ONE NOSTRIL UPON SIGNS OF OPIOID OVERDOSE. CALL 911. REPEAT AFTER 3 MINUTES IF NO RESPONSE. (Patient not taking: Reported on 11/17/2023)     naproxen  (NAPROSYN ) 500 MG tablet Take 1 tablet (500 mg total) by mouth 2 (two) times daily with a meal.     Oxycodone  HCl 10 MG TABS Take 10 mg by mouth 2 (two) times daily as needed.     pantoprazole  (PROTONIX ) 40 MG tablet  Take 1 tablet (40 mg total) by mouth daily. 90 tablet 3   Probiotic Product (ULTRAFLORA IMMUNE HEALTH PO) Take 1 capsule by mouth daily.     tirzepatide  (MOUNJARO ) 7.5 MG/0.5ML Pen Inject 7.5 mg into the skin once a week. 6 mL 3   Current Facility-Administered Medications  Medication Dose Route Frequency Provider Last Rate Last Admin   0.9 %  sodium chloride  infusion  500 mL Intravenous Once Charlanne Groom, MD        Allergies  Allergen Reactions   Lisinopril Cough    Review of Systems:  Constitutional: Denies fever, chills, diaphoresis, appetite change and fatigue.  HEENT: neg Respiratory: Denies SOB, DOE, cough, chest tightness,  and wheezing.   Cardiovascular: Denies chest pain, palpitations and leg swelling.  Genitourinary: Denies dysuria, urgency, frequency, hematuria, flank pain and difficulty urinating.  Musculoskeletal: Denies myalgias, has back pain, joint swelling, arthralgias and gait problem.  Skin: No rash.  Neurological: Denies dizziness, seizures, syncope, weakness, light-headedness, numbness and headaches.  Hematological: Denies adenopathy. Easy bruising, personal or family bleeding history  Psychiatric/Behavioral: No anxiety or depression     Physical Exam:    BP 126/73   Pulse 75   Temp 97.9 F (36.6 C) (Skin)   Ht 5' 3 (1.6 m)   Wt 179 lb (81.2 kg)   SpO2 98%   BMI 31.71 kg/m  Wt Readings from Last 3 Encounters:  11/17/23 179 lb (81.2 kg)  09/20/23 177 lb 9.6 oz (80.6 kg)  09/14/23 178 lb 12.8 oz (81.1 kg)   Constitutional:  Well-developed, in no acute distress. Psychiatric: Normal mood and affect. Behavior is normal. HEENT: Pupils normal.  Conjunctivae are normal. No scleral icterus. Neck supple.  Cardiovascular: Normal rate, regular rhythm. No edema Pulmonary/chest: Effort normal and breath sounds normal. No wheezing, rales or rhonchi. Abdominal: Soft, nondistended. Nontender. Bowel sounds active throughout. There are no masses palpable. No  hepatomegaly. Rectal: Deferred Neurological: Alert and oriented to person place and time. Skin: Skin is warm and dry. No rashes noted.  Data Reviewed: I have personally reviewed following labs  and imaging studies  CBC:    Latest Ref Rng & Units 08/16/2023   11:04 AM 03/16/2023   10:06 AM 02/24/2022   10:51 AM  CBC  WBC 4.0 - 10.5 K/uL 6.0  7.9  7.1   Hemoglobin 12.0 - 15.0 g/dL 86.9  87.0  85.7   Hematocrit 36.0 - 46.0 % 38.5  39.3  43.0   Platelets 150.0 - 400.0 K/uL 483.0  446.0  393     CMP:    Latest Ref Rng & Units 11/11/2023    8:17 AM 08/16/2023   11:04 AM 05/06/2023    8:22 AM  CMP  Glucose 70 - 99 mg/dL  96  99   BUN 6 - 23 mg/dL  17  20   Creatinine 9.55 - 1.00 mg/dL 9.09  9.20  8.96   Sodium 135 - 145 mEq/L  141  142   Potassium 3.5 - 5.1 mEq/L  3.3  4.5   Chloride 96 - 112 mEq/L  102  103   CO2 19 - 32 mEq/L  30  30   Calcium  8.4 - 10.5 mg/dL  9.2  89.9   Total Protein 6.0 - 8.3 g/dL  8.1    Total Bilirubin 0.2 - 1.2 mg/dL  0.3    Alkaline Phos 39 - 117 U/L  60    AST 0 - 37 U/L  18    ALT 0 - 35 U/L  21         Anselm Bring, MD 11/17/2023, 9:24 AM  Cc: Katheen Roselie Rockford, NP

## 2023-11-17 NOTE — Op Note (Signed)
 [facility] Patient Name: Susan Whitaker Procedure Date: 11/17/2023 9:20 AM MRN: [id number] Endoscopist: Lynnie Bring , MD, [id number] Age: 53 Referring MD:  Date of Birth: [birth year] Gender: Female Account #: [id number] Procedure:                Upper GI endoscopy Indications:              Epigastric abdominal pain, Abnormal CT of the GI                            tract showing distal gastric mass Medicines:                Monitored Anesthesia Care Procedure:                Pre-Anesthesia Assessment:                           - Prior to the procedure, a History and Physical                            was performed, and patient medications and                            allergies were reviewed. The patient's tolerance of                            previous anesthesia was also reviewed. The risks                            and benefits of the procedure and the sedation                            options and risks were discussed with the patient.                            All questions were answered, and informed consent                            was obtained. Prior Anticoagulants: The patient has                            taken no anticoagulant or antiplatelet agents. ASA                            Grade Assessment: II - A patient with mild systemic                            disease. After reviewing the risks and benefits,                            the patient was deemed in satisfactory condition to                            undergo the procedure.  After obtaining informed consent, the endoscope was                            passed under direct vision. Throughout the                            procedure, the patient's blood pressure, pulse, and                            oxygen saturations were monitored continuously. The                            Endoscope was introduced through the mouth, and                            advanced to the second  part of duodenum. The upper                            GI endoscopy was accomplished without difficulty.                            The patient tolerated the procedure well. Scope In: Scope Out: Findings:                 The examined esophagus was normal with well-defined                            Z-line at 35 cm, examined by NBI.                           A small hiatal hernia was present.                           One 45 mm pedunculated polyp with large polypoid                            head (with superficial ulcers/erosions) was found                            on the greater curvature of the distal gastric                            body. The pedicle was thick. Multiple Biopsies were                            taken with a cold forceps for histology including                            some with tunnel technique. Not causing any                            outlet obstruction.                           Two  6 to 7 mm mucosal papules (nodules) with no                            bleeding and no stigmata of recent bleeding were                            found in the proximal gastric body (?importance).                            Biopsies were taken with a cold forceps for                            histology.                           Diffuse mild inflammation characterized by erythema                            was found in the entire examined stomach. Biopsies                            were taken with a cold forceps for histology using                            Syd[place] protocol (including antrum, body, fundus and                            cardia).                           The examined antrum and duodenum was normal. Complications:            No immediate complications. Estimated Blood Loss:     Estimated blood loss was minimal. Impression:               - Large gastric polypoid pedunculated lesion                            (biopsied)                           - Two small  mucosal papules (nodules) found in the                            stomach (?importance). Biopsied.                           - Mild Atrophic gastritis. Biopsied.                           - Normal examined duodenum. No outlet obstruction. Recommendation:           - Patient has a contact number available for                            emergencies. The signs and symptoms of potential  delayed complications were discussed with the                            patient. Return to normal activities tomorrow.                            Written discharge instructions were provided to the                            patient.                           - Resume previous diet.                           - Continue present medications including Protonix                             40 mg p.o. daily.                           - Await pathology results.                           - No aspirin , ibuprofen , naproxen , or other                            non-steroidal anti-inflammatory drugs.                           - Check CBC, CMP today.                           - I do believe that the above would be amenable to                            endoscopic resection. Will consult Dr Agatha-                            may or may not need EUS before.                           - The findings and recommendations were discussed                            with the patient's family. Lynnie Bring, MD 11/17/2023 10:10:26 AM This report has been signed electronically.

## 2023-11-21 ENCOUNTER — Telehealth: Payer: Self-pay | Admitting: Lactation Services

## 2023-11-21 ENCOUNTER — Telehealth: Payer: Self-pay

## 2023-11-21 LAB — SURGICAL PATHOLOGY

## 2023-11-21 NOTE — Telephone Encounter (Signed)
  Follow up Call-     11/17/2023    9:06 AM  Call back number  Post procedure Call Back phone  # [phone]  Permission to leave phone message Yes     Patient questions:  Do you have a fever, pain , or abdominal swelling? No. Pain Score  0 *  Have you tolerated food without any problems? Yes.     Have you been able to return to your normal activities? Yes.    Do you have any questions about your discharge instructions: Diet   No. Medications  No. Follow up visit  No.  Do you have questions or concerns about your Care? No.  Actions: * If pain score is 4 or above: No action needed, pain <4.

## 2023-11-21 NOTE — Telephone Encounter (Signed)
 Received call from Dr. LeGolvan with  pathology  in regard to pt pathology requesting that it be brought to Dr. Charlanne attention. Please review pt Pathology in regard to gastric mass.

## 2023-11-22 ENCOUNTER — Other Ambulatory Visit: Payer: Self-pay

## 2023-11-22 DIAGNOSIS — C801 Malignant (primary) neoplasm, unspecified: Secondary | ICD-10-CM

## 2023-11-22 DIAGNOSIS — R9389 Abnormal findings on diagnostic imaging of other specified body structures: Secondary | ICD-10-CM

## 2023-11-22 DIAGNOSIS — K3189 Other diseases of stomach and duodenum: Secondary | ICD-10-CM

## 2023-11-22 NOTE — Progress Notes (Signed)
 I have informed pt.  Bx for that large pedunculated polyp: HGD with foci of adenocarcinoma with some spaying of smooth muscle suspicious for invasion.  Bx from 2 proximal gastric nodules-low-grade NET  Gastric mapping biopsies-atrophic gastritis   Susan Whitaker, Have sent message to Dr Wilhelmenia for EUS/endoscopic resection Pl make appt with oncology and Surgery. Pl call path to have then do stain for H. Pylori on gastric Bx   Send report to family physician

## 2023-11-23 ENCOUNTER — Telehealth: Payer: Self-pay | Admitting: Gastroenterology

## 2023-11-23 ENCOUNTER — Other Ambulatory Visit: Payer: Self-pay

## 2023-11-23 ENCOUNTER — Telehealth: Payer: Self-pay

## 2023-11-23 DIAGNOSIS — K3189 Other diseases of stomach and duodenum: Secondary | ICD-10-CM

## 2023-11-23 NOTE — Telephone Encounter (Signed)
-----   Message from [facility] sent at 11/23/2023 12:49 PM EDT ----- Regarding: RE: Gastric Bx RG, Sounds good.  Luqman Perrelli,  Please schedule this for 90 minute slot Upper EUS possible EMR. Can let Elspeth put in referrals to surgery and oncology. Thanks. GM ----- Message ----- From: Charlanne Groom, MD Sent: 11/23/2023  12:11 PM EDT To: Odetta LITTIE Curly, RN; Aloha Wilhelmenia Raddle., # Subject: RE: Gastric Bx                                 Thanks Diann Atlas keep the spot for 8/11 for EUS/resection. Anselm Odetta, If there is any cancellation, can move her up  Elspeth Atlas also proceed with CT chest for staging RE: gastric CA  RG ----- Message ----- From: Wilhelmenia Aloha Raddle., MD Sent: 11/23/2023   6:33 AM EDT To: Groom Charlanne, MD Subject: RE: Gastric Bx                                 RG, Would get CT Chest for completion staging. Set her up to see Oncology/Surgery. I can do EUS and see what could be possible if resection could occur, but I don't have chance until 8/11. Let me know and we can have Juleah Paradise, save that spot. May want to present her case or speak directly with surgeon seeing her about thoughts about the pedunculation perspective. In case they want to go directly for surgery, you probably need to place a tattoo adjacent to this mass for them too. Let me know. Thanks. GM ----- Message ----- From: Charlanne Groom, MD Sent: 11/22/2023   1:31 PM EDT To: Aloha Wilhelmenia Raddle., MD Subject: Gastric Bx                                     Hi Gabe,  Bx for that large pedunculated polyp: HGD with foci of adenocarcinoma with some spaying of smooth muscle suspicious for invasion. Bx from 2 proximal gastric nodules-low-grade NET Gastric mapping biopsies-atrophic gastritis   Do you think she would need EUS/endoscopic resection? Can always get surgical/oncology consultation as well.  Appreciate your advice Anselm

## 2023-11-23 NOTE — Telephone Encounter (Signed)
 EUS scheduled, pt instructed and medications reviewed.  Patient instructions mailed to home.  Patient to call with any questions or concerns.  The pt has been advised to hold Mounjaro  7 days prior

## 2023-11-23 NOTE — Telephone Encounter (Signed)
 EUS has been set up for 12/26/23 at 1215 pm at [facility] with GM

## 2023-11-25 ENCOUNTER — Other Ambulatory Visit: Payer: Self-pay

## 2023-11-25 ENCOUNTER — Telehealth: Payer: Self-pay

## 2023-11-25 ENCOUNTER — Telehealth: Payer: Self-pay | Admitting: Hematology

## 2023-11-25 DIAGNOSIS — K3189 Other diseases of stomach and duodenum: Secondary | ICD-10-CM

## 2023-11-25 DIAGNOSIS — C801 Malignant (primary) neoplasm, unspecified: Secondary | ICD-10-CM

## 2023-11-25 DIAGNOSIS — R9389 Abnormal findings on diagnostic imaging of other specified body structures: Secondary | ICD-10-CM

## 2023-11-25 NOTE — Telephone Encounter (Signed)
 Susan Whitaker has been scheduled to see Dr. Lanny on 7/16.

## 2023-11-25 NOTE — Telephone Encounter (Signed)
 Pt was made aware of the recommendations for the CT of the chest.  Pt was ordered and scheduled for the Chest CT on 11/29/2023 at 2:30 at [facility].  Pt to arrive at 2:15 PM. Pt made aware.  Pt verbalized understanding with all questions answered.

## 2023-11-25 NOTE — Telephone Encounter (Signed)
-----   Message from [facility] sent at 11/23/2023 12:49 PM EDT ----- Regarding: RE: Gastric Bx RG, Sounds good.  Patty,  Please schedule this for 90 minute slot Upper EUS possible EMR. Can let Elspeth put in referrals to surgery and oncology. Thanks. GM ----- Message ----- From: Charlanne Groom, MD Sent: 11/23/2023  12:11 PM EDT To: Odetta LITTIE Curly, RN; Aloha Wilhelmenia Raddle., # Subject: RE: Gastric Bx                                 Thanks Diann Atlas keep the spot for 8/11 for EUS/resection. Anselm Odetta, If there is any cancellation, can move her up  Elspeth Atlas also proceed with CT chest for staging RE: gastric CA  RG ----- Message ----- From: Wilhelmenia Aloha Raddle., MD Sent: 11/23/2023   6:33 AM EDT To: Groom Charlanne, MD Subject: RE: Gastric Bx                                 RG, Would get CT Chest for completion staging. Set her up to see Oncology/Surgery. I can do EUS and see what could be possible if resection could occur, but I don't have chance until 8/11. Let me know and we can have Patty, save that spot. May want to present her case or speak directly with surgeon seeing her about thoughts about the pedunculation perspective. In case they want to go directly for surgery, you probably need to place a tattoo adjacent to this mass for them too. Let me know. Thanks. GM ----- Message ----- From: Charlanne Groom, MD Sent: 11/22/2023   1:31 PM EDT To: Aloha Wilhelmenia Raddle., MD Subject: Gastric Bx                                     Hi Gabe,  Bx for that large pedunculated polyp: HGD with foci of adenocarcinoma with some spaying of smooth muscle suspicious for invasion. Bx from 2 proximal gastric nodules-low-grade NET Gastric mapping biopsies-atrophic gastritis   Do you think she would need EUS/endoscopic resection? Can always get surgical/oncology consultation as well.  Appreciate your advice Anselm

## 2023-11-28 NOTE — Progress Notes (Signed)
 Chief Complaint  Patient presents with  . New Consultation    DISTAL GASTRIC MASS WORRISOME FOR ADENOCARCINOMA    Subjective  Susan Whitaker is a 53 y.o. 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 The pain is intermittent, with severity ranging from 3 to 7 out of 10. She is cautious about her diet to avoid hospital readmission.  Recently a CT scan and endoscopy revealed a mass. The endoscopy showed a polyp with dysplasia and a small focus of cancer. She has not taken any gastric medications like Protonix  or Pepcid and experiences intermittent nausea but no reflux.  Her medical history includes bowel obstruction surgeries at birth and in January 2021. She recalls having stomach issues prior to the 2021 surgery but not as severe. She experienced a sharp pain leading to her last surgery, which was performed at [facility].  She has not had a colonoscopy but is in the process of scheduling one. She works in [employer], with a mix of desk and field work. She wears loose clothing to avoid pressure on her abdomen.    EGD Susan Whitaker 11/17/23 - The examined esophagus was normal with well- defined Z- line at 35 cm, examined by [facility].  - A small hiatal hernia was present.  - One 45 mm pedunculated polyp with large polypoid head ( with superficial ulcers/ erosions) was found on the greater curvature of the distal gastric body. The pedicle was thick. Multiple Biopsies were taken with a cold forceps for histology including some with  tunnel technique . Not causing any outlet obstruction.  - Two 6 to 7 mm mucosal papules ( nodules) with no bleeding and no stigmata of recent bleeding were found in the  proximal gastric body ( ? importance) . Biopsies were taken with a cold forceps for histology.  - Diffuse mild inflammation characterized by erythema was found in the entire examined stomach. Biopsies were taken with a cold forceps for histology using Sydney protocol ( including antrum, body, fundus and cardia) . - The examined antrum and duodenum was normal.  Pathology egd 11/17/23 1. Stomach, biopsy, Gastric mass :      -  SUPERFICIAL FRAGMENTS OF GASTRIC MUCOSA WITH EXTENSIVE HIGH-GRADE DYSPLASIA      AND A SMALL FOCUS OF ADENOCARCINOMA IN SITU AT LEAST WITH SPLAYING OF SMOOTH      MUSCLE SUSPICIOUS FOR INVASION.      NOTE: DR. REBBECCA HAS PEER-REVIEWED THE CASE AND AGREES WITH THE      INTERPRETATION.      2. Stomach, biopsy, Proximal gastric nodule x 2 :      -  NEUROENDOCRINE TUMOR, LOW GRADE.      NOTE: IMMUNOHISTOCHEMICAL STAINS FOR SYNAPTOPHYSIN, CHROMOGRANIN AND CD56 ARE      POSITIVE.  THE PROLIFERATION RATE BY KI-67 IS LOW (<2%).  DR. REBBECCA HAS      PEER-REVIEWED THE CASE AND AGREES WITH THE INTERPRETATION.      3. Stomach, biopsy, Gastric antrum, gastric body and gastric fundus biopsy :      -  FRAGMENTS OF PREDOMINANTLY ANTRAL TYPE MUCOSA WITH NO DEFINITIVE OXYNTIC TYPE      MUCOSA (NOTED BIOPSIES INCLUDE BODY AND FUNDUS) WITH FOCI OF MICRONODULAR  ENTEROCHROMAFFIN CELL HYPERPLASIA SUGGESTIVE OF ATROPHY/AUTOIMMUNE GASTRITIS.Susan Whitaker      -  NEGATIVE FOR MALIGNANCY/DYSPLASIA.    CT entero abd/pelvis w contrast 11/11/23 IMPRESSION: 1. Distal gastric mass, worrisome for adenocarcinoma. These results will be called to the ordering clinician or representative by the Radiologist Assistant, and communication documented in the PACS or [employer]. 2.  Aortic atherosclerosis (ICD10-I70.0).   Review of Systems  Gastrointestinal:  Positive for abdominal pain and nausea.  All other systems reviewed and are negative.   Patient Active Problem List  Diagnosis  . HTN  (hypertension)  . Type 2 diabetes mellitus (CMS/HHS-HCC)  . History of small bowel obstruction  . Hypoaldosteronism (CMS/HHS-HCC)  . Fibroid, uterine  . Adenomatous polyp of stomach    Outpatient Medications Prior to Visit  Medication Sig Dispense Refill  . amLODIPine  (NORVASC ) 10 MG tablet Take 10 mg by mouth once daily    . atorvastatin  (LIPITOR) 40 MG tablet Take 40 mg by mouth once daily    . INVOKANA  300 mg tablet TAKE 1 TABLET BY MOUTH ONCE DAILY BEFORE BREAKFAST    . losartan  (COZAAR ) 100 MG tablet Take 100 mg by mouth once daily    . metoprolol  tartrate (LOPRESSOR ) 100 MG tablet Take 100 mg by mouth 2 (two) times daily    . ondansetron  (ZOFRAN ) 4 MG tablet     . polyethylene glycol 3350  (MIRALAX  ORAL) Take by mouth    . spironolactone  (ALDACTONE ) 100 MG tablet Take 100 mg by mouth once daily    . TRULICITY  3 mg/0.5 mL subcutaneous pen injector INJECT 3 MG SUBCUTANEOUSLY ONCE A WEEK     No facility-administered medications prior to visit.      Objective  Vitals:   11/28/23 1444 11/28/23 1446  BP: 122/77   Pulse: 105   Temp: 36.6 C (97.9 F)   TempSrc: Oral   SpO2: 97%   Weight: 77.9 kg (171 lb 12.8 oz)   Height: 160 cm (5' 3)   PainSc:  0-No pain   Body mass index is 30.43 kg/m.  Physical Exam  Head:  Normocephalic and atraumatic.  Mouth/Throat: Oropharynx is clear and moist. No oropharyngeal exudate.  Eyes:  Conjunctivae are normal. Pupils are equal, round, and reactive to light. No scleral icterus.  Neck:  Normal range of motion. Neck supple. No tracheal deviation present. No thyromegaly present.  Cardiovascular: Normal rate, regular rhythm, normal heart sounds and intact distal pulses.  Exam reveals no gallop and no friction rub.   No murmur heard. Respiratory: Effort normal and breath sounds normal. No respiratory distress. No wheezes, rales or rhonchi.  No chest wall tenderness.  GI:  Soft. Bowel sounds are normal. Abdomen is soft non distended. Scar is  tender. No masses or hepatosplenomegaly is present. There is no rebound and no guarding.  Musculoskeletal: . Extremities are non tender and without deformity.  Lymphadenopathy:   No cervical or axillary adenopathy.  Neurological: Alert and oriented to person, place, and time. Coordination normal.  Skin:  Skin is warm and dry. No rash noted. No diaphoresis. No erythema. No pallor.  Psychiatric: Normal mood and affect.Behavior is normal. Judgment and thought content normal.       Assessment/Plan:   Assessment & Plan Adenocarcinoma in situ of stomach in a polyp with suspicion of microinvasion Patient has been referred to Dr. Wilhelmenia for attempt of endomucosal resection.  I agree with this.  If the polyp can be removed with good margins, the patient may not require extensive  gastric resection.   If the polyp cannot be removed, or if the margins are positive, we will need to do a partial gastrectomy.  Also, if there is more extensive invasive cancer, we may need to do a partial gastrectomy.  Currently however there is adenocarcinoma in situ in a polyp as the only form of malignant disease.  Unfortunately, I do not think that this has any bearing on her abdominal pain.  I think that this is related either to her scar or something else.  Her scar certainly is very sensitive.  Either way.  I told her that if we have to do surgery, I would not anticipate any relief of her symptoms from resection.  She is planning on getting a colonoscopy.  I read back through her operative note and it does not sound like there was anything unusual about her surgery.  She had an ex lap with lysis of adhesions.  There were a few omental adhesions to the posterior abdominal wall that were divided , there were 2 adhesive bands causing the small bowel obstruction that were divided and one serosal defect that was oversewn.  Follow-up with me will be dependent on the results of the endoscopic resection  The patient is  getting a chest CT tomorrow and has a appointment with medical oncology.     Diagnoses and all orders for this visit:  Adenomatous polyp of stomach

## 2023-11-29 ENCOUNTER — Ambulatory Visit (HOSPITAL_BASED_OUTPATIENT_CLINIC_OR_DEPARTMENT_OTHER)
Admission: RE | Admit: 2023-11-29 | Discharge: 2023-11-29 | Disposition: A | Discharge status: Home / Self Care | Source: Ambulatory Visit | Attending: Gastroenterology | Admitting: Gastroenterology

## 2023-11-29 ENCOUNTER — Encounter (HOSPITAL_BASED_OUTPATIENT_CLINIC_OR_DEPARTMENT_OTHER): Payer: Self-pay

## 2023-11-29 ENCOUNTER — Other Ambulatory Visit (HOSPITAL_BASED_OUTPATIENT_CLINIC_OR_DEPARTMENT_OTHER): Payer: Self-pay

## 2023-11-29 DIAGNOSIS — C801 Malignant (primary) neoplasm, unspecified: Secondary | ICD-10-CM | POA: Insufficient documentation

## 2023-11-29 DIAGNOSIS — R9389 Abnormal findings on diagnostic imaging of other specified body structures: Secondary | ICD-10-CM | POA: Diagnosis present

## 2023-11-29 DIAGNOSIS — K3189 Other diseases of stomach and duodenum: Secondary | ICD-10-CM | POA: Diagnosis present

## 2023-11-29 MED ORDER — IOHEXOL 300 MG/ML  SOLN
100.0000 mL | Freq: Once | INTRAMUSCULAR | Status: AC | PRN
  Administered 2023-11-29: 75 mL via INTRAVENOUS

## 2023-11-30 ENCOUNTER — Other Ambulatory Visit

## 2023-11-30 ENCOUNTER — Ambulatory Visit: Admitting: Oncology

## 2023-11-30 ENCOUNTER — Encounter: Payer: Self-pay | Admitting: Hematology

## 2023-11-30 ENCOUNTER — Ambulatory Visit: Attending: Hematology | Admitting: Hematology

## 2023-11-30 VITALS — BP 114/64 | HR 92 | Temp 98.0°F | Resp 16 | Ht 63.0 in | Wt 174.9 lb

## 2023-11-30 DIAGNOSIS — I10 Essential (primary) hypertension: Secondary | ICD-10-CM | POA: Insufficient documentation

## 2023-11-30 DIAGNOSIS — C169 Malignant neoplasm of stomach, unspecified: Secondary | ICD-10-CM | POA: Insufficient documentation

## 2023-11-30 DIAGNOSIS — K2950 Unspecified chronic gastritis without bleeding: Secondary | ICD-10-CM | POA: Insufficient documentation

## 2023-11-30 DIAGNOSIS — E119 Type 2 diabetes mellitus without complications: Secondary | ICD-10-CM | POA: Insufficient documentation

## 2023-11-30 DIAGNOSIS — R109 Unspecified abdominal pain: Secondary | ICD-10-CM | POA: Diagnosis not present

## 2023-11-30 DIAGNOSIS — Z803 Family history of malignant neoplasm of breast: Secondary | ICD-10-CM | POA: Insufficient documentation

## 2023-11-30 DIAGNOSIS — G8929 Other chronic pain: Secondary | ICD-10-CM | POA: Diagnosis not present

## 2023-11-30 NOTE — Progress Notes (Signed)
 [facility]   Telephone:(336) [phone] Fax:([phone]   Clinic New Consult Note   Patient Care Team: Nche, Roselie Rockford, NP as PCP - General (Internal Medicine) Okey Vina GAILS, MD as PCP - Cardiology (Cardiology) Waddell Danelle ORN, MD as PCP - Electrophysiology (Cardiology) Prescilla Beams, MD as Consulting Physician (Nephrology) Waddell Danelle ORN, MD as Consulting Physician (Cardiology) Gloriann Chick, MD as Consulting Physician (Obstetrics and Gynecology) Camillo Golas, MD as Attending Physician (Ophthalmology) 11/30/2023  CHIEF COMPLAINTS/PURPOSE OF CONSULTATION:  Gastric adenocarcinoma in situ  REFERRING PHYSICIAN: Dr. Charlanne   Discussed the use of AI scribe software for clinical note transcription with the patient, who gave verbal consent to proceed.  History of Present Illness Susan Whitaker is a 53 year old female with gastric cancer who presents for a new consult. She is accompanied by her daughter. She was referred by Dr. Charlanne for evaluation of gastric cancer.  She has a history of chronic stomach issues, with worsening symptoms following surgery for a bowel obstruction in January 2021. In July 2025, an endoscopy identified a 4-5 cm gastric polyp in distal stomach with biopsy showed high-grade dysplasia and a small focus of adenocarcinoma in situ. Two small gastric nodules were identified and a biopsy showed neuroendocrine tumors. She experiences persistent abdominal pain, described as stabbing, worsened by pressure and certain foods. She avoids foods like raw broccoli to prevent pain.  Her past medical history includes a partial bowel obstruction in 2022, anxiety, allergies, hemorrhoids, acid reflux, hypertension, hyperlipidemia, a thyroid  nodule, and well-controlled type 2 diabetes. She underwent surgery at [age] months old and another at age 42 for similar issues.  Family history is significant for breast cancer in her mother and maternal aunts. Genetic  testing was negative for hereditary cancer syndromes.  Current medications include cholesterol medication, amlodipine , Synthroid, and a diabetes medication. She has previously used oxycodone  for pain but has discontinued it.     MEDICAL HISTORY:  Past Medical History:  Diagnosis Date   Abdominal bloating 09/14/2020   Abdominal pain 10/04/2020   Class 2 obesity due to excess calories with body mass index (BMI) of 35.0 to 35.9 in adult 06/2010   DM type 2 (diabetes mellitus, type 2) (HCC)    Gastric ulcer    GERD (gastroesophageal reflux disease)    H/O constipation    H/O hemorrhoids    History of small bowel obstruction 06/02/2019   Hyperlipidemia    Hypertension    Hypokalemia 2019   Menorrhagia    Microalbuminuria 02/15/2012   Primary hyperaldosteronism (HCC) 02/21/2012   S/P radiofrequency ablation operation for arrhythmia 10/03/20 10/04/2020   SVT (supraventricular tachycardia) (HCC)    Noted 11/2011 admission   SVT (supraventricular tachycardia) (HCC) 12/29/2011   Thrombocytopenia (HCC)    Type 2 diabetes mellitus with obesity (HCC) 11/17/2020   IMO SNOMED Dx Update Oct 2024     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    SURGICAL HISTORY: Past Surgical History:  Procedure Laterality Date   ABDOMINAL ADHESION SURGERY  06/03/2019   Dr Curvin   ABDOMINAL SURGERY     12 months of age-- unsure of type of surgery   COLONOSCOPY  04/2016   hemorrhoids--normal per pt with Dr Kristie   DILATATION & CURETTAGE/HYSTEROSCOPY WITH MYOSURE N/A 02/08/2020   Procedure: DILATATION & CURETTAGE/HYSTEROSCOPY WITH MYOSURE;  Surgeon: Gloriann Chick, MD;  Location: [facility];  Service: Gynecology;  Laterality: N/A;   HEMORRHOID SURGERY  2005/2006   INTRAUTERINE DEVICE (IUD) INSERTION N/A 02/08/2020  Procedure: INTRAUTERINE DEVICE (IUD) INSERTION UNDER ULTRASOUND GUIDANCE;  Surgeon: Gloriann Chick, MD;  Location: [facility];  Service: Gynecology;  Laterality: N/A;  Mirena    LAPAROTOMY  N/A 06/03/2019   Procedure: EXPLORATORY LAPAROTOMY WITH LYSIS OF ADHESIONS;  Surgeon: Curvin Deward MOULD, MD;  Location: [facility];  Service: General;  Laterality: N/A;   SVT ABLATION N/A 10/03/2020   Procedure: SVT ABLATION;  Surgeon: Waddell Danelle ORN, MD;  Location: [facility];  Service: Cardiovascular;  Laterality: N/A;   UPPER GASTROINTESTINAL ENDOSCOPY     UTERINE FIBROID EMBOLIZATION  2010   at [facility]    SOCIAL HISTORY: Social History   Socioeconomic History   Marital status: Single    Spouse name: Not on file   Number of children: 2   Years of education: Not on file   Highest education level: Not on file  Occupational History   Occupation: YOUTH EMPLOYMENT    Employer: [employer]   Occupation: egineering  Tobacco Use   Smoking status: Never   Smokeless tobacco: Never  Vaping Use   Vaping status: Never Used  Substance and Sexual Activity   Alcohol  use: No   Drug use: No   Sexual activity: Not on file  Other Topics Concern   Not on file  Social History Narrative   Works at [employer]         Social Drivers of Corporate investment banker Strain: Not on file  Food Insecurity: Food Insecurity Present (11/30/2023)   Hunger Vital Sign    Worried About Running Out of Food in the Last Year: Sometimes true    Ran Out of Food in the Last Year: Sometimes true  Transportation Needs: No Transportation Needs (11/30/2023)   PRAPARE - Administrator, Civil Service (Medical): No    Lack of Transportation (Non-Medical): No  Physical Activity: Not on file  Stress: Not on file  Social Connections: Not on file  Intimate Partner Violence: Not At Risk (11/30/2023)   Humiliation, Afraid, Rape, and Kick questionnaire    Fear of Current or Ex-Partner: No    Emotionally Abused: No    Physically Abused: No    Sexually Abused: No    FAMILY HISTORY: Family History  Problem Relation Age of Onset   Cancer Mother 78       breast   Stroke Mother    Vision loss  Father        some   Heart disease Father        Rhythm disturbance   Hypertension Sister    Hypertension Brother    Hypertension Brother    Cancer Maternal Aunt 73       Breast   Cancer Maternal Grandmother 31       breast   Hypertension Maternal Grandmother    Cancer Other        breast, lung   Hypertension Other    Stroke Other    Diabetes Neg Hx    Colon cancer Neg Hx    Esophageal cancer Neg Hx    Stomach cancer Neg Hx    Rectal cancer Neg Hx     ALLERGIES:  is allergic to lisinopril.  MEDICATIONS:  Current Outpatient Medications  Medication Sig Dispense Refill   acetaminophen  (TYLENOL ) 325 MG tablet Take 2 tablets (650 mg total) by mouth every 4 (four) hours as needed for headache or mild pain.     amLODipine  (NORVASC ) 10 MG tablet Take 1 tablet (  10 mg total) by mouth daily. 90 tablet 3   atorvastatin  (LIPITOR) 80 MG tablet Take 1 tablet (80 mg total) by mouth daily. 90 tablet 3   blood glucose meter kit and supplies Use in the morning, afternoon, and at bedtime. 1 each 0   canagliflozin  (INVOKANA ) 300 MG TABS tablet Take 1 tablet (300 mg total) by mouth daily before breakfast. 90 tablet 4   glucose blood (ACCU-CHEK GUIDE) test strip Use as instructed to check once daily. 50 each 12   Glucose Blood (BLOOD GLUCOSE TEST STRIPS) STRP Use 1 each daily to test blood glucose levels. 100 each 3   levonorgestrel  (MIRENA , 52 MG,) 20 MCG/DAY IUD Mirena  20 mcg/24 hours (7 yrs) 52 mg intrauterine device  Take by intrauterine route.     methocarbamol  (ROBAXIN ) 500 MG tablet Take 1 tablet (500 mg total) by mouth 2 (two) times daily as needed for muscle spasms. (Patient not taking: Reported on 11/17/2023) 21 tablet 0   naloxone (NARCAN) nasal spray 4 mg/0.1 mL ADMINISTER A SINGLE SPRAY IN ONE NOSTRIL UPON SIGNS OF OPIOID OVERDOSE. CALL 911. REPEAT AFTER 3 MINUTES IF NO RESPONSE. (Patient not taking: Reported on 11/17/2023)     naproxen  (NAPROSYN ) 500 MG tablet Take 1 tablet (500 mg total)  by mouth 2 (two) times daily with a meal.     ONETOUCH DELICA LANCETS FINE MISC Use to check blood sugar 3 times daily 100 each 3   pantoprazole  (PROTONIX ) 40 MG tablet Take 1 tablet (40 mg total) by mouth daily. 90 tablet 3   polyethylene glycol (MIRALAX  / GLYCOLAX ) 17 g packet Take 17 g by mouth once a week.     Probiotic Product (ULTRAFLORA IMMUNE HEALTH PO) Take 1 capsule by mouth daily.     tirzepatide  (MOUNJARO ) 7.5 MG/0.5ML Pen Inject 7.5 mg into the skin once a week. 6 mL 3   No current facility-administered medications for this visit.    REVIEW OF SYSTEMS:   Constitutional: Denies fevers, chills or abnormal night sweats Eyes: Denies blurriness of vision, double vision or watery eyes Ears, nose, mouth, throat, and face: Denies mucositis or sore throat Respiratory: Denies cough, dyspnea or wheezes Cardiovascular: Denies palpitation, chest discomfort or lower extremity swelling Gastrointestinal:  Denies nausea, heartburn or change in bowel habits Skin: Denies abnormal skin rashes Lymphatics: Denies new lymphadenopathy or easy bruising Neurological:Denies numbness, tingling or new weaknesses Behavioral/Psych: Mood is stable, no new changes  All other systems were reviewed with the patient and are negative.  PHYSICAL EXAMINATION: ECOG PERFORMANCE STATUS: 1 - Symptomatic but completely ambulatory  Vitals:   11/30/23 1321  BP: 114/64  Pulse: 92  Resp: 16  Temp: 98 F (36.7 C)  SpO2: 99%   Filed Weights   11/30/23 1321  Weight: 174 lb 14.4 oz (79.3 kg)    GENERAL:alert, no distress and comfortable SKIN: skin color, texture, turgor are normal, no rashes or significant lesions EYES: normal, conjunctiva are pink and non-injected, sclera clear OROPHARYNX:no exudate, no erythema and lips, buccal mucosa, and tongue normal  NECK: supple, thyroid  normal size, non-tender, without nodularity LYMPH:  no palpable lymphadenopathy in the cervical, axillary or inguinal LUNGS: clear to  auscultation and percussion with normal breathing effort HEART: regular rate & rhythm and no murmurs and no lower extremity edema ABDOMEN:abdomen soft, non-tender and normal bowel sounds Musculoskeletal:no cyanosis of digits and no clubbing  PSYCH: alert & oriented x 3 with fluent speech NEURO: no focal motor/sensory deficits  Physical Exam  LABORATORY DATA:  I have reviewed the data as listed    Latest Ref Rng & Units 11/17/2023   10:49 AM 08/16/2023   11:04 AM 03/16/2023   10:06 AM  CBC  WBC 4.0 - 10.5 K/uL 7.0  6.0  7.9   Hemoglobin 12.0 - 15.0 g/dL 86.2  86.9  87.0   Hematocrit 36.0 - 46.0 % 41.3  38.5  39.3   Platelets 150.0 - 400.0 K/uL 385.0  483.0  446.0     @cmpl @  RADIOGRAPHIC STUDIES: I have personally reviewed the radiological images as listed and agreed with the findings in the report. CT CHEST W CONTRAST Result Date: 11/30/2023 CLINICAL DATA:  Gastric cancer.  * Tracking Code: BO * EXAM: CT CHEST WITH CONTRAST TECHNIQUE: Multidetector CT imaging of the chest was performed during intravenous contrast administration. RADIATION DOSE REDUCTION: This exam was performed according to the departmental dose-optimization program which includes automated exposure control, adjustment of the mA and/or kV according to patient size and/or use of iterative reconstruction technique. CONTRAST:  75mL OMNIPAQUE  IOHEXOL  300 MG/ML  SOLN COMPARISON:  CT abdomen pelvis 11/11/2023 and CT chest 06/10/2020. FINDINGS: Cardiovascular: Heart is at the upper limits of normal in size to mildly enlarged. Enlarged pulmonic trunk. Mediastinum/Nodes: Probable thymic tissue in the prevascular space, although unexpected for age. No pathologically enlarged mediastinal, hilar or axillary lymph nodes. Esophagus is grossly unremarkable. Lungs/Pleura: Lungs are clear. No pleural fluid. Airway is unremarkable. Upper Abdomen: Mildly elevated right hemidiaphragm. Known distal gastric polypoid mass better seen on CT  abdomen 11/11/2023. Visualized portions of the liver, gallbladder, adrenal glands, kidneys, spleen, pancreas, stomach and bowel are otherwise grossly unremarkable. No upper abdominal adenopathy. Musculoskeletal: Degenerative changes in the spine. IMPRESSION: 1. Known gastric mass better visualized on CT abdomen 11/11/2023. No evidence of metastatic disease in the chest. 2. Enlarged pulmonic trunk, indicative of pulmonary arterial hypertension. Electronically Signed   By: Newell Eke M.D.   On: 11/30/2023 11:22   CT ENTERO ABD/PELVIS W CONTAST Result Date: 11/11/2023 CLINICAL DATA:  Chronic mid abdominal pain rating to the back since 2021. EXAM: CT ABDOMEN AND PELVIS WITH CONTRAST (ENTEROGRAPHY) TECHNIQUE: Multidetector CT of the abdomen and pelvis during bolus administration of intravenous contrast. Negative oral contrast was given. RADIATION DOSE REDUCTION: This exam was performed according to the departmental dose-optimization program which includes automated exposure control, adjustment of the mA and/or kV according to patient size and/or use of iterative reconstruction technique. CONTRAST:  OMNIPAQUE  IOHEXOL  300 MG/ML  SOLN COMPARISON:  11/19/2020. FINDINGS: Lower chest: Lung bases are clear. Heart is at the upper limits of normal in size. No pericardial or pleural effusion. Distal esophagus is grossly unremarkable. Hepatobiliary: Liver and gallbladder are unremarkable. No biliary ductal dilatation. Pancreas: Negative. Spleen: Negative. Adrenals/Urinary Tract: Adrenal glands and kidneys are unremarkable. Ureters are decompressed. Bladder is grossly unremarkable. Stomach/Bowel: 2.3 x 3.6 cm mass along the ventral distal gastric wall (3/84). Stomach, small bowel and colon are otherwise unremarkable. Appendix is not readily visualized. Vascular/Lymphatic: Atherosclerotic calcification of the aorta. No pathologically enlarged lymph nodes. Reproductive: Intrauterine contraceptive device in place.  Calcified uterine fibroids. No adnexal mass. Other: Mesenteries and peritoneum are unremarkable. Musculoskeletal: Degenerative changes in the spine. No worrisome lytic or sclerotic lesions. IMPRESSION: 1. Distal gastric mass, worrisome for adenocarcinoma. These results will be called to the ordering clinician or representative by the Radiologist Assistant, and communication documented in the PACS or [employer]. 2.  Aortic atherosclerosis (ICD10-I70.0). Electronically Signed   By: Newell  Blietz M.D.   On: 11/11/2023 10:45     Assessment & Plan Gastric adenocarcinoma in situ Gastric adenocarcinoma in situ identified in a 4-5 cm polyp in the stomach. The adenocarcinoma is non-invasive and contained, indicating a very early stage. The majority of the polyp is high-grade dysplasia, a precancerous lesion.  We also discussed the possibility of invasive adenocarcinoma, which could be missed on biopsy.   -The CT scan shows no evidence of lymph node involvement or metastasis, suggesting early-stage disease.  I personally reviewed the scan images with patient and her daughter. - Schedule endoscopic ultrasound (EUS) on August 11 with Dr. Wilhelmenia to assess the depth of the adenocarcinoma and determine if it is invasive with possible repeated biopsy or endoscopical resection. - If the adenocarcinoma is invasive, consider surgical resection of the affected stomach area by Dr. Gene. - I discussed the role of neoadjuvant or adjuvant chemotherapy for invasive gastric adenocarcinoma, especially T2 or above lesion, or with positive lymph nodes.  Neuroendocrine tumor of stomach Two small nodules in the stomach identified as neuroendocrine tumors. These are low-grade, well-differentiated, and slow-growing, with a KI-67 proliferation index of less than 2%. The tumors are less aggressive than adenocarcinoma and may not require immediate intervention. - Evaluate the size and location of the neuroendocrine tumors  during the scheduled EUS. - Consider removal if feasible during endoscopy, or monitor if they are in a difficult position.  Chronic autoimmune gastritis Chronic autoimmune gastritis confirmed by biopsy, contributing to stomach sensitivity and pain. This condition increases the risk for neuroendocrine tumors and is associated with inflammation of the stomach lining. - Discuss management of chronic gastritis with Dr. Charlanne, focusing on dietary modifications to reduce inflammation. - Avoid foods that exacerbate gastritis, such as raw, high-fiber, and acidic foods.  Abdominal pain Chronic abdominal pain, described as stabbing and exacerbated by pressure, likely related to chronic gastritis and previous surgical history. - Use Tylenol  for pain management as needed, avoiding NSAIDs like Naprosyn  which may irritate the stomach.  Type 2 diabetes mellitus, well-controlled Type 2 diabetes is well-controlled with current management. She is cautious with diet to avoid exacerbating stomach issues. - Continue current diabetes management and dietary precautions.  Hypertension Hypertension managed with current medications. - Continue current antihypertensive therapy.  Plan - I reviewed her EGD finding, biopsy results, and CT scan images - Patient is scheduled for EUS on August 11, patient wants to know if that can be moved to an earlier date - Depends on the EUS finding, Dr. Rheta will determine if endoscopic resection is possible.  If not, will make the final treatment recommendations of surgery and chemo based on her staging.  -f/u after EUS    No orders of the defined types were placed in this encounter.   All questions were answered. The patient knows to call the clinic with any problems, questions or concerns. I spent 40 minutes counseling the patient face to face. The total time spent in the appointment was 45 minutes including review of chart and various tests results, discussions about  plan of care and coordination of care plan.     Onita Mattock, MD 11/30/2023

## 2023-11-30 NOTE — Progress Notes (Signed)
 PATIENT NAVIGATOR PROGRESS NOTE  Name: Susan Whitaker Date: 11/30/2023 MRN: [id number]  DOB: 27-Dec-1970   Reason for visit:  New patient appointment  Comments:   Patient seen in office during her initial visit with Dr. Onita Mattock.  Patient has 2 daughters who are actively involved.  SDOH assessment was completed. A social work referral was entered based on the assessment.  No emergent needs were identified.  Patient was given my direct contact information and was instructed to contact the office if she had any questions or concerns after today's appointment.  Patient's daughters were also told they were welcome to reach out with any questions or concerns.    Patient is scheduled for EUS in August.  Her need for Med/Onc follow-up is dependent on the EUS results.  Will follow-up with patient as needed.  Time spent counseling/coordinating care: > 60 minutes

## 2023-12-01 ENCOUNTER — Ambulatory Visit: Payer: Self-pay | Admitting: Gastroenterology

## 2023-12-02 ENCOUNTER — Telehealth: Payer: Self-pay

## 2023-12-02 NOTE — Telephone Encounter (Signed)
 Patient requested to have a work not stating that she can can work from home since she said she wears a mask now due to her cancer. Please advise. She did pick up her FMLA paperwork as well. Please advise

## 2023-12-06 ENCOUNTER — Telehealth: Payer: Self-pay

## 2023-12-06 ENCOUNTER — Encounter: Payer: Self-pay | Admitting: Nurse Practitioner

## 2023-12-06 ENCOUNTER — Ambulatory Visit: Admitting: Nurse Practitioner

## 2023-12-06 VITALS — BP 118/70 | HR 92 | Temp 98.7°F | Ht 63.0 in | Wt 175.8 lb

## 2023-12-06 DIAGNOSIS — Q2579 Other congenital malformations of pulmonary artery: Secondary | ICD-10-CM | POA: Insufficient documentation

## 2023-12-06 NOTE — Telephone Encounter (Signed)
 Called [phone] and left a voicemail for a call back regarding a fax number. Voicebox of Mr Susan Whitaker  Work note for this patient to work from home

## 2023-12-06 NOTE — Assessment & Plan Note (Addendum)
 Noted on recent CT chest 10/2023: Enlarged pulmonic trunk, indicative of pulmonary arterial hypertension. She denies any SOB or PND or CP or palpitation or fatigue  Entered echocardiogram order

## 2023-12-06 NOTE — Patient Instructions (Signed)
 You will be contacted to schedule appointment for an echocardiogram.

## 2023-12-06 NOTE — Progress Notes (Signed)
 Established Patient Visit  Patient: Susan Whitaker   DOB: Jul 16, 1970   53 y.o. Female  MRN: [id number] Visit Date: 12/06/2023  Subjective:    Chief Complaint  Patient presents with   Follow-up    (FASTING) 3 month f/u and discuss test results    HPI Pulmonary artery anomaly Noted on recent CT chest 10/2023: Enlarged pulmonic trunk, indicative of pulmonary arterial hypertension. She denies any SOB or PND or CP or palpitation or fatigue  Entered echocardiogram order  Reviewed medical, surgical, and social history today  Medications: Outpatient Medications Prior to Visit  Medication Sig   acetaminophen  (TYLENOL ) 325 MG tablet Take 2 tablets (650 mg total) by mouth every 4 (four) hours as needed for headache or mild pain.   amLODipine  (NORVASC ) 10 MG tablet Take 1 tablet (10 mg total) by mouth daily.   atorvastatin  (LIPITOR) 80 MG tablet Take 1 tablet (80 mg total) by mouth daily.   blood glucose meter kit and supplies Use in the morning, afternoon, and at bedtime.   canagliflozin  (INVOKANA ) 300 MG TABS tablet Take 1 tablet (300 mg total) by mouth daily before breakfast.   glucose blood (ACCU-CHEK GUIDE) test strip Use as instructed to check once daily.   Glucose Blood (BLOOD GLUCOSE TEST STRIPS) STRP Use 1 each daily to test blood glucose levels.   levonorgestrel  (MIRENA , 52 MG,) 20 MCG/DAY IUD Mirena  20 mcg/24 hours (7 yrs) 52 mg intrauterine device  Take by intrauterine route.   naproxen  (NAPROSYN ) 500 MG tablet Take 1 tablet (500 mg total) by mouth 2 (two) times daily with a meal.   ONETOUCH DELICA LANCETS FINE MISC Use to check blood sugar 3 times daily   pantoprazole  (PROTONIX ) 40 MG tablet Take 1 tablet (40 mg total) by mouth daily.   polyethylene glycol (MIRALAX  / GLYCOLAX ) 17 g packet Take 17 g by mouth once a week.   tirzepatide  (MOUNJARO ) 7.5 MG/0.5ML Pen Inject 7.5 mg into the skin once a week.   methocarbamol  (ROBAXIN ) 500 MG tablet Take 1 tablet (500 mg  total) by mouth 2 (two) times daily as needed for muscle spasms. (Patient not taking: Reported on 12/06/2023)   naloxone (NARCAN) nasal spray 4 mg/0.1 mL ADMINISTER A SINGLE SPRAY IN ONE NOSTRIL UPON SIGNS OF OPIOID OVERDOSE. CALL 911. REPEAT AFTER 3 MINUTES IF NO RESPONSE. (Patient not taking: Reported on 12/06/2023)   Probiotic Product (ULTRAFLORA IMMUNE HEALTH PO) Take 1 capsule by mouth daily.   No facility-administered medications prior to visit.   Reviewed past medical and social history.   ROS per HPI above      Objective:  BP 118/70 (BP Location: Left Arm, Patient Position: Sitting, Cuff Size: Normal)   Pulse 92   Temp 98.7 F (37.1 C) (Oral)   Ht 5' 3 (1.6 m)   Wt 175 lb 12.8 oz (79.7 kg)   SpO2 99%   BMI 31.14 kg/m      Physical Exam Vitals and nursing note reviewed.  Cardiovascular:     Rate and Rhythm: Normal rate and regular rhythm.     Pulses: Normal pulses.     Heart sounds: Normal heart sounds. No murmur heard.    No gallop.  Musculoskeletal:     Right lower leg: No edema.     Left lower leg: No edema.  Neurological:     Mental Status: She is alert and oriented to person, place, and  time.  Psychiatric:        Mood and Affect: Mood normal.        Behavior: Behavior normal.        Thought Content: Thought content normal.     No results found for any visits on 12/06/23.    Assessment & Plan:    Problem List Items Addressed This Visit     Pulmonary artery anomaly - Primary   Noted on recent CT chest 10/2023: Enlarged pulmonic trunk, indicative of pulmonary arterial hypertension. She denies any SOB or PND or CP or palpitation or fatigue  Entered echocardiogram order      Relevant Orders   ECHOCARDIOGRAM COMPLETE   Return in about 6 months (around 06/07/2024) for HTN, hyperlipidemia (fasting).     Roselie Mood, NP

## 2023-12-06 NOTE — Telephone Encounter (Signed)
 Copied from CRM [id number]. Topic: Clinical - Medical Advice >> Dec 06, 2023 10:20 AM Charolett L wrote: Reason for CRM: Felicia  from the [facility] vascular Is calling for pre- authorization for a referral for the echo cardio gram and she needs a call back as soon as possible because patient has an upcoming appt Cb# [phone]

## 2023-12-06 NOTE — Telephone Encounter (Signed)
 Called to [facility] and spoke with [facility] and informed her of the comments.

## 2023-12-07 NOTE — Telephone Encounter (Signed)
 Called number again  Patient will get work not placed upfront for patient. Told her about the two voicemail's I left

## 2023-12-19 NOTE — Telephone Encounter (Signed)
 LVM about paperwork and time frame that she dropped off

## 2023-12-20 ENCOUNTER — Telehealth: Payer: Self-pay

## 2023-12-20 ENCOUNTER — Encounter (HOSPITAL_COMMUNITY): Payer: Self-pay | Admitting: Gastroenterology

## 2023-12-20 DIAGNOSIS — Z01810 Encounter for preprocedural cardiovascular examination: Secondary | ICD-10-CM

## 2023-12-20 DIAGNOSIS — Q2579 Other congenital malformations of pulmonary artery: Secondary | ICD-10-CM

## 2023-12-20 NOTE — Addendum Note (Signed)
 Addended by: KATHEEN ROSELIE CROME on: 12/20/2023 04:14 PM   Modules accepted: Orders

## 2023-12-20 NOTE — Telephone Encounter (Signed)
 Please advise about upcoming procedure

## 2023-12-20 NOTE — Telephone Encounter (Signed)
 Copied from CRM #[id number]. Topic: Clinical - Medical Advice >> Dec 20, 2023 12:27 PM Martinique E wrote: Reason for CRM: Brooke from [facility] called in stating that the hospital requested patient to have medical clearance before her procedure on 8/11. Brooke questioning if the patient has the OK to get this done. Callback number for Lyle is [phone].

## 2023-12-20 NOTE — Telephone Encounter (Addendum)
 Dear Roselie Mood, NP  This patient is having an EUS on 12-26-23 and we are asking for clearance. Please let us  know if patient is able to proceed with having this procedure at [facility] or not. Thank you. Note below from carrie at [facility] long   From Deloit at [place] [facility]    this pt is for next Mon 8/11, her pcp recently found some pulm HTN on one of her recent CT scans, they ordered an echo for her to be done 8/26. Due to that, anesthesia has requested pcp clearance for her procedure.

## 2023-12-20 NOTE — Telephone Encounter (Signed)
 From [place] at [facility] endo   this pt is for next Mon 8/11, her pcp recently found some pulm HTN on one of her recent CT scans, they ordered an echo for her to be done 8/26. Due to that, anesthesia has requested pcp clearance for her procedure.    Lyle Ishihara CMA sent a clearance letter to PCP

## 2023-12-20 NOTE — Progress Notes (Signed)
 Susan Whitaker   PCPGLENWOOD Mood NP Cardiologist-Taylor MD Pulm- n/a  EKG-06/02/22 Echo-2013 Cath-n/a Stress-n/a ICD/PM- n/a GLP1-Mounjaro  8/3 last dose hold 1 week Blood Thinner-n/a  HX: HTN,DM, SVT with ablation 2022. Patient last saw her cardiologist in 2022, has not had any issues with her heart since ablation. Patient last saw her pcp 12/06/23, they discussed a finding on her CT that showed enlarged pulmonic trunk indicative of pulm HTN. Per patient she doesn't have any symptoms, denies SOB but they did order an echo to evaluate this and its on 01/10/24. She had EGD on 7/3 which showed two neuroendocrine tumors of stomach, so now getting EUS to evaluate those.  Anesthesia Review- Yes- due to recent findings on CT needs pcp clearance, GI office notified via epic chat 8/5/cb

## 2023-12-21 ENCOUNTER — Telehealth: Payer: Self-pay | Admitting: Cardiology

## 2023-12-21 ENCOUNTER — Encounter (HOSPITAL_BASED_OUTPATIENT_CLINIC_OR_DEPARTMENT_OTHER): Payer: Self-pay

## 2023-12-21 ENCOUNTER — Telehealth: Payer: Self-pay

## 2023-12-21 ENCOUNTER — Emergency Department (HOSPITAL_BASED_OUTPATIENT_CLINIC_OR_DEPARTMENT_OTHER)
Admission: EM | Admit: 2023-12-21 | Discharge: 2023-12-21 | Disposition: A | Discharge status: Home / Self Care | Attending: Emergency Medicine | Admitting: Emergency Medicine

## 2023-12-21 ENCOUNTER — Other Ambulatory Visit: Payer: Self-pay

## 2023-12-21 DIAGNOSIS — Z01818 Encounter for other preprocedural examination: Secondary | ICD-10-CM | POA: Diagnosis present

## 2023-12-21 DIAGNOSIS — I10 Essential (primary) hypertension: Secondary | ICD-10-CM | POA: Diagnosis not present

## 2023-12-21 DIAGNOSIS — E119 Type 2 diabetes mellitus without complications: Secondary | ICD-10-CM | POA: Diagnosis not present

## 2023-12-21 DIAGNOSIS — Z79899 Other long term (current) drug therapy: Secondary | ICD-10-CM | POA: Diagnosis not present

## 2023-12-21 NOTE — Telephone Encounter (Signed)
Ro can you let me know if you see this fax please.

## 2023-12-21 NOTE — Telephone Encounter (Signed)
 Patient called and stated that she is currently at the [facility] [place] and is going to have her echo cardiogram there. Patient stated that she would like to have her surgery still for the same day and time that it was originally schedule for. Patient is requesting a call back. Please advise.

## 2023-12-21 NOTE — Discharge Instructions (Addendum)
 Cardiology will be calling to schedule close follow-up with you this week for ultrasound of the heart tomorrow and a preoperative clearance appointment likely on Friday

## 2023-12-21 NOTE — Telephone Encounter (Signed)
 Susan Whitaker HERO [facility]   12/21/23 12:17 PM Note Patient called and stated that she is currently at the [facility] in [place] and is going to have her echo cardiogram there. Patient stated that she would like to have her surgery still for the same day and time that it was originally schedule for. Patient is requesting a call back. Please advise.

## 2023-12-21 NOTE — Telephone Encounter (Signed)
 The pt had echo today and is currently waiting to see an MD. She will have them fax a clearance letter to our office as soon as able.

## 2023-12-21 NOTE — Telephone Encounter (Signed)
 Nothing from the fax for this patient.

## 2023-12-21 NOTE — ED Triage Notes (Signed)
 Arrives POV with requesting an EKG. Patient is scheduled for surgery related to stomach cancer and she was informed that she is required to have an EKG for surgery or it would be rescheduled. No chest pain or current cardiac issues.

## 2023-12-21 NOTE — Telephone Encounter (Signed)
 Spoke with the pt and made her aware of the need for clearance and echo before the EUS can proceed.  She is going to call and try to get appt this week.  I will leave appt as planned and touch base with the pt on Friday. [facility] endo has been advised

## 2023-12-21 NOTE — Telephone Encounter (Signed)
 Patient called and stated that she has received a clarence in order for her to have her surgery. Patient stated that she would like to be contact in whenever we receive the fax. Patient stated that if we have not received it by 5 pm today to please give her a call back in order for her to come up here with a paper copy. Please advise.

## 2023-12-21 NOTE — Telephone Encounter (Signed)
 I spoke with the Susan Whitaker and she tells me that she actually will have the clearance from Dr Lonni Cash on Friday.

## 2023-12-21 NOTE — Telephone Encounter (Signed)
 Procedure:Ultrasound Procedure date: 12/26/23 Procedure location: [facility] Arrival Time: 11 am Spoke with the patient Y/N: yes Any prep concerns? n  Has the patient obtained the prep from the pharmacy ? n Do you have a care partner and transportation: y Any additional concerns? n

## 2023-12-21 NOTE — ED Provider Notes (Signed)
 [place] [facility] Provider Note   CSN: [id number] Arrival date & time: 12/21/23  1206     Patient presents with: Medical Clearance and EKG   Susan Whitaker is a 53 y.o. female.   HPI    53 year old female with recent diagnosis of gastric adenocarcinoma currently Lan for endoscopic ultrasound of the upper GI tract on 12/26/2023 with Dr. Arville Roddy of [facility] gastroenterology, follows outpatient with Dr. Ileana of hematology who presents to the emergency department due to concern for need for emergent preoperative clearance.  The patient states that she was called and told that she needed to get an outpatient echocardiogram for preoperative clearance as she had CT finding outpatient which showed an enlarged pulmonary artery trunk.  Her medical history includes partial bowel obstruction in 2022, anxiety, allergies, hemorrhoids, GERD, HTN, HLD, thyroid  nodule as well as type 2 diabetes.  Per Dr. Ileana notes from 11/30/2023, she is scheduled for endoscopic ultrasound on August 11 with Dr. Wilhelmenia to assess the depth of the adenocarcinoma and determine if it is invasive with possible repeated biopsy or endoscopical resection.  She denies any symptoms at this time, in triage requested an EKG mistakenly, on chart review it appears that she needs an echocardiogram.  CT chest on 10/2023 showed an enlarged pulmonary trunk indicative of pulmonary arterial hypertension.  She denies any shortness of breath, chest pain, heart palpitations or fatigue.   Prior to Admission medications   Medication Sig Start Date End Date Taking? Authorizing Provider  acetaminophen  (TYLENOL ) 325 MG tablet Take 2 tablets (650 mg total) by mouth every 4 (four) hours as needed for headache or mild pain. 10/04/20   Marylu Leita SAUNDERS, NP  amLODipine  (NORVASC ) 10 MG tablet Take 1 tablet (10 mg total) by mouth daily. 03/16/23   Nche, Roselie Rockford, NP  atorvastatin  (LIPITOR) 80 MG tablet Take 1  tablet (80 mg total) by mouth daily. 09/21/23   Thapa, [place], MD  blood glucose meter kit and supplies Use in the morning, afternoon, and at bedtime. 05/20/23   Thapa, [place], MD  canagliflozin  (INVOKANA ) 300 MG TABS tablet Take 1 tablet (300 mg total) by mouth daily before breakfast. 05/20/23   Thapa, [place], MD  glucose blood (ACCU-CHEK GUIDE) test strip Use as instructed to check once daily. 10/20/21   [place]on [place], MD  Glucose Blood (BLOOD GLUCOSE TEST STRIPS) STRP Use 1 each daily to test blood glucose levels. 05/20/23 05/19/24  Thapa, [place], MD  levonorgestrel  (MIRENA , 52 MG,) 20 MCG/DAY IUD Mirena  20 mcg/24 hours (7 yrs) 52 mg intrauterine device  Take by intrauterine route.    [provider]  naproxen  (NAPROSYN ) 500 MG tablet Take 1 tablet (500 mg total) by mouth 2 (two) times daily with a meal. 09/14/23   Nche, Roselie Rockford, NP  [facility] DELICA LANCETS FINE MISC Use to check blood sugar 3 times daily 09/28/16   Von [place], MD  pantoprazole  (PROTONIX ) 40 MG tablet Take 1 tablet (40 mg total) by mouth daily. 08/16/23   Charlanne Groom, MD  polyethylene glycol (MIRALAX  / GLYCOLAX ) 17 g packet Take 17 g by mouth once a week.    [provider]  Probiotic Product (ULTRAFLORA IMMUNE HEALTH PO) Take 1 capsule by mouth daily.    [provider]  tirzepatide  (MOUNJARO ) 7.5 MG/0.5ML Pen Inject 7.5 mg into the skin once a week. 06/01/23   Thapa, [place], MD    Allergies: Lisinopril    Review of Systems  All other systems reviewed and are negative.   Updated Vital Signs BP (!) 139/93   Pulse 99   Temp 97.8 F (36.6 C) (Temporal)   Resp 20   Ht 5' 3 (1.6 m)   Wt 79.7 kg   SpO2 100%   BMI 31.13 kg/m   Physical Exam Vitals and nursing note reviewed.  Constitutional:      General: She is not in acute distress. HENT:     Head: Normocephalic and atraumatic.  Eyes:     Conjunctiva/sclera: Conjunctivae normal.     Pupils: Pupils are equal, round, and reactive to light.   Cardiovascular:     Rate and Rhythm: Normal rate and regular rhythm.  Pulmonary:     Effort: Pulmonary effort is normal. No respiratory distress.  Abdominal:     General: There is no distension.     Tenderness: There is no guarding.  Musculoskeletal:        General: No deformity or signs of injury.     Cervical back: Neck supple.  Skin:    Findings: No lesion or rash.  Neurological:     General: No focal deficit present.     Mental Status: She is alert. Mental status is at baseline.     (all labs ordered are listed, but only abnormal results are displayed) Labs Reviewed - No data to display  EKG: EKG Interpretation Date/Time:  Wednesday December 21 2023 12:21:12 EDT Ventricular Rate:  93 PR Interval:  144 QRS Duration:  85 QT Interval:  346 QTC Calculation: 431 R Axis:   60  Text Interpretation: Sinus rhythm Confirmed by Jerrol Agent ([id number]) on 12/21/2023 12:54:53 PM  Radiology: No results found.   Procedures   Medications Ordered in the ED - No data to display                                  Medical Decision Making  53 year old female with recent diagnosis of gastric adenocarcinoma currently Lan for endoscopic ultrasound of the upper GI tract on 12/26/2023 with Dr. Arville Roddy of [facility] gastroenterology, follows outpatient with Dr. Ileana of hematology who presents to the emergency department due to concern for need for emergent preoperative clearance.  The patient states that she was called and told that she needed to get an outpatient echocardiogram for preoperative clearance as she had CT finding outpatient which showed an enlarged pulmonary artery trunk.  Her medical history includes partial bowel obstruction in 2022, anxiety, allergies, hemorrhoids, GERD, HTN, HLD, thyroid  nodule as well as type 2 diabetes.  Per Dr. Ileana notes from 11/30/2023, she is scheduled for endoscopic ultrasound on August 11 with Dr. Wilhelmenia to assess the depth of the adenocarcinoma and  determine if it is invasive with possible repeated biopsy or endoscopical resection.  She denies any symptoms at this time, in triage requested an EKG mistakenly, on chart review it appears that she needs an echocardiogram.  CT chest on 10/2023 showed an enlarged pulmonary trunk indicative of pulmonary arterial hypertension.  She denies any shortness of breath, chest pain, heart palpitations or fatigue.  I consulted [place] gastroenterology for further recommendations who engaged with inpatient cardiology for management options to obtain preoperative clearance in time for the patient's endoscopic ultrasound on Monday.  Gastroenterology coordinated with cardiology, plan for outpatient cardiac ultrasound tomorrow at the [facility] location, then preoperative office visit with Dr. Verlin on Friday.  Patient is stable  for discharge, advised of the plan for outpatient management, cardiology to call to schedule.     Final diagnoses:  Preoperative clearance    ED Discharge Orders     None          Jerrol Agent, MD 12/21/23 1426

## 2023-12-21 NOTE — Telephone Encounter (Signed)
See alternate phone note  

## 2023-12-21 NOTE — Telephone Encounter (Signed)
 This is a patient with recent diagnosis of gastric adenocarcinoma with tentative plans for endoscopic ultrasound 8/11.  She was seen at the emergency room today for emergency preoperative evaluation.  Dr. Vernita had called to expedite eval. Case discussed with Dr. Wonda.  We have arranged urgent evaluation.  She will have an echocardiogram that has been scheduled for tomorrow 8/7 at 1:00.  She then has an appointment with Dr. Verlin Friday

## 2023-12-22 ENCOUNTER — Ambulatory Visit (HOSPITAL_COMMUNITY)
Admission: RE | Admit: 2023-12-22 | Discharge: 2023-12-22 | Disposition: A | Discharge status: Home / Self Care | Source: Ambulatory Visit | Attending: Vascular Surgery | Admitting: Vascular Surgery

## 2023-12-22 DIAGNOSIS — Q2579 Other congenital malformations of pulmonary artery: Secondary | ICD-10-CM | POA: Insufficient documentation

## 2023-12-22 DIAGNOSIS — I351 Nonrheumatic aortic (valve) insufficiency: Secondary | ICD-10-CM

## 2023-12-22 LAB — ECHOCARDIOGRAM COMPLETE
AR max vel: 2.65 cm2
AV Area VTI: 3.09 cm2
AV Area mean vel: 2.86 cm2
AV Mean grad: 4 mmHg
AV Peak grad: 6.9 mmHg
Ao pk vel: 1.31 m/s
Area-P 1/2: 2.92 cm2
S' Lateral: 1.89 cm

## 2023-12-23 ENCOUNTER — Ambulatory Visit: Attending: Cardiovascular Disease | Admitting: Cardiovascular Disease

## 2023-12-23 ENCOUNTER — Telehealth: Payer: Self-pay | Admitting: Gastroenterology

## 2023-12-23 VITALS — HR 85 | Ht 63.0 in | Wt 169.4 lb

## 2023-12-23 DIAGNOSIS — I471 Supraventricular tachycardia, unspecified: Secondary | ICD-10-CM

## 2023-12-23 DIAGNOSIS — Z01810 Encounter for preprocedural cardiovascular examination: Secondary | ICD-10-CM | POA: Diagnosis not present

## 2023-12-23 DIAGNOSIS — I351 Nonrheumatic aortic (valve) insufficiency: Secondary | ICD-10-CM | POA: Diagnosis not present

## 2023-12-23 NOTE — Telephone Encounter (Signed)
 I spoke with the pt and made her aware I can see the note with clearance from cardiology. I have also sent this information to [facility] endo     1. Pre-operative cardiovascular examination: She has a normal cardiac exam, normal EKG and normal LV function by echo yesterday. She is very active. No concerning cardiac symptoms. She can proceed with her planned surgical procedure.

## 2023-12-23 NOTE — Progress Notes (Signed)
 Chief Complaint  Patient presents with   New Patient (Initial Visit)    Aortic insufficiency   History of Present Illness: 53 yo female with history of DM, GERD, HLD, HTN, primary hyperaldosteronism, SVT s/p ablation in 2022 who is here today to re-establish care. She has been followed in our office in the past by Dr. Okey and Dr. Waddell. She has been recently diagnosed with gastric adenocarcinoma. She was sent to the ED on 12/21/23 for pre-operative clearance and her case was discussed with cardiology over the phone. Plan in place to have an outpatient echo performed and have her seen in our office today. Echo 12/22/23 with LVEF=65-70%. Mild aortic valve insufficiency. Very mild dilation of the aortic root. EKG normal sinus on 12/21/23.   She has no complaints today. She denies chest pain, dyspnea, palpitations, LE edema or dizziness. She feels well overall.   Primary Care Physician: Katheen Roselie Rockford, NP   Past Medical History:  Diagnosis Date   Abdominal bloating 09/14/2020   Abdominal pain 10/04/2020   Class 2 obesity due to excess calories with body mass index (BMI) of 35.0 to 35.9 in adult 06/2010   DM type 2 (diabetes mellitus, type 2) (HCC)    Gastric ulcer    GERD (gastroesophageal reflux disease)    H/O constipation    H/O hemorrhoids    History of small bowel obstruction 06/02/2019   Hyperlipidemia    Hypertension    Hypokalemia 2019   Menorrhagia    Microalbuminuria 02/15/2012   Primary hyperaldosteronism (HCC) 02/21/2012   S/P radiofrequency ablation operation for arrhythmia 10/03/20 10/04/2020   SVT (supraventricular tachycardia) (HCC)    Noted 11/2011 admission   SVT (supraventricular tachycardia) (HCC) 12/29/2011   Thrombocytopenia (HCC)    Type 2 diabetes mellitus with obesity (HCC) 11/17/2020   IMO SNOMED Dx Update Oct 2024     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    Past Surgical History:  Procedure Laterality Date   ABDOMINAL ADHESION SURGERY  06/03/2019   Dr Curvin    ABDOMINAL SURGERY     27 months of age-- unsure of type of surgery   COLONOSCOPY  04/2016   hemorrhoids--normal per pt with Dr Kristie   DILATATION & CURETTAGE/HYSTEROSCOPY WITH MYOSURE N/A 02/08/2020   Procedure: DILATATION & CURETTAGE/HYSTEROSCOPY WITH MYOSURE;  Surgeon: Gloriann Chick, MD;  Location: [facility];  Service: Gynecology;  Laterality: N/A;   HEMORRHOID SURGERY  2005/2006   INTRAUTERINE DEVICE (IUD) INSERTION N/A 02/08/2020   Procedure: INTRAUTERINE DEVICE (IUD) INSERTION UNDER ULTRASOUND GUIDANCE;  Surgeon: Gloriann Chick, MD;  Location: [facility];  Service: Gynecology;  Laterality: N/A;  Mirena    LAPAROTOMY N/A 06/03/2019   Procedure: EXPLORATORY LAPAROTOMY WITH LYSIS OF ADHESIONS;  Surgeon: Curvin Deward MOULD, MD;  Location: [facility];  Service: General;  Laterality: N/A;   SVT ABLATION N/A 10/03/2020   Procedure: SVT ABLATION;  Surgeon: Waddell Danelle ORN, MD;  Location: [facility];  Service: Cardiovascular;  Laterality: N/A;   UPPER GASTROINTESTINAL ENDOSCOPY     UTERINE FIBROID EMBOLIZATION  2010   at [facility]    Current Outpatient Medications  Medication Sig Dispense Refill   acetaminophen  (TYLENOL ) 325 MG tablet Take 2 tablets (650 mg total) by mouth every 4 (four) hours as needed for headache or mild pain.     amLODipine  (NORVASC ) 10 MG tablet Take 1 tablet (10 mg total) by mouth daily. 90 tablet 3   atorvastatin  (LIPITOR) 80 MG tablet Take 1 tablet (  80 mg total) by mouth daily. 90 tablet 3   canagliflozin  (INVOKANA ) 300 MG TABS tablet Take 1 tablet (300 mg total) by mouth daily before breakfast. (Patient taking differently: Take 300 mg by mouth daily before breakfast. Told to hold before pre-op) 90 tablet 4   levonorgestrel  (MIRENA , 52 MG,) 20 MCG/DAY IUD Mirena  20 mcg/24 hours (7 yrs) 52 mg intrauterine device  Take by intrauterine route.     pantoprazole  (PROTONIX ) 40 MG tablet Take 1 tablet (40 mg total) by mouth daily. 90 tablet 3    polyethylene glycol (MIRALAX  / GLYCOLAX ) 17 g packet Take 17 g by mouth once a week.     Probiotic Product (ULTRAFLORA IMMUNE HEALTH PO) Take 1 capsule by mouth daily.     tirzepatide  (MOUNJARO ) 7.5 MG/0.5ML Pen Inject 7.5 mg into the skin once a week. (Patient taking differently: Inject 7.5 mg into the skin once a week. Told to hold before pre-op) 6 mL 3   blood glucose meter kit and supplies Use in the morning, afternoon, and at bedtime. (Patient not taking: Reported on 12/23/2023) 1 each 0   glucose blood (ACCU-CHEK GUIDE) test strip Use as instructed to check once daily. (Patient not taking: Reported on 12/23/2023) 50 each 12   Glucose Blood (BLOOD GLUCOSE TEST STRIPS) STRP Use 1 each daily to test blood glucose levels. (Patient not taking: Reported on 12/23/2023) 100 each 3   naproxen  (NAPROSYN ) 500 MG tablet Take 1 tablet (500 mg total) by mouth 2 (two) times daily with a meal. (Patient not taking: Reported on 12/23/2023)     ONETOUCH DELICA LANCETS FINE MISC Use to check blood sugar 3 times daily (Patient not taking: Reported on 12/23/2023) 100 each 3   No current facility-administered medications for this visit.    Allergies  Allergen Reactions   Lisinopril Cough    Social History   Socioeconomic History   Marital status: Single    Spouse name: Not on file   Number of children: 2   Years of education: Not on file   Highest education level: Not on file  Occupational History   Occupation: YOUTH EMPLOYMENT    Employer: [employer]   Occupation: egineering  Tobacco Use   Smoking status: Never   Smokeless tobacco: Never  Vaping Use   Vaping status: Never Used  Substance and Sexual Activity   Alcohol  use: No   Drug use: No   Sexual activity: Not on file  Other Topics Concern   Not on file  Social History Narrative   Works at [employer]         Social Drivers of Corporate investment banker Strain: Not on file  Food Insecurity: Food Insecurity Present (11/30/2023)   Hunger  Vital Sign    Worried About Running Out of Food in the Last Year: Sometimes true    Ran Out of Food in the Last Year: Sometimes true  Transportation Needs: No Transportation Needs (11/30/2023)   PRAPARE - Administrator, Civil Service (Medical): No    Lack of Transportation (Non-Medical): No  Physical Activity: Not on file  Stress: Not on file  Social Connections: Not on file  Intimate Partner Violence: Not At Risk (11/30/2023)   Humiliation, Afraid, Rape, and Kick questionnaire    Fear of Current or Ex-Partner: No    Emotionally Abused: No    Physically Abused: No    Sexually Abused: No    Family History  Problem Relation Age of Onset  Cancer Mother 25       breast   Stroke Mother    Vision loss Father        some   Heart disease Father        Rhythm disturbance   Hypertension Sister    Hypertension Brother    Hypertension Brother    Cancer Maternal Aunt 24       Breast   Cancer Maternal Grandmother 2       breast   Hypertension Maternal Grandmother    Cancer Other        breast, lung   Hypertension Other    Stroke Other    Diabetes Neg Hx    Colon cancer Neg Hx    Esophageal cancer Neg Hx    Stomach cancer Neg Hx    Rectal cancer Neg Hx     Review of Systems:  As stated in the HPI and otherwise negative.   Pulse 85   Ht 5' 3 (1.6 m)   Wt 169 lb 6 oz (76.8 kg)   SpO2 97%   BMI 30.00 kg/m   Physical Examination: General: Well developed, well nourished, NAD  HEENT: OP clear, mucus membranes moist  SKIN: warm, dry. No rashes. Neuro: No focal deficits  Musculoskeletal: Muscle strength 5/5 all ext  Psychiatric: Mood and affect normal  Neck: No JVD, no carotid bruits, no thyromegaly, no lymphadenopathy.  Lungs:Clear bilaterally, no wheezes, rhonci, crackles Cardiovascular: Regular rate and rhythm. No murmurs, gallops or rubs. Abdomen:Soft. Bowel sounds present. Non-tender.  Extremities: No lower extremity edema. Pulses are 2 + in the bilateral  DP/PT.  EKG:  EKG is not ordered today. The ekg ordered today demonstrates   Recent Labs: 08/16/2023: TSH 1.40 11/17/2023: ALT 18; BUN 14; BUN 14; BUN 14; Creatinine, Ser 0.86; Creatinine, Ser 0.86; Creatinine, Ser 0.86; Hemoglobin 13.7; Platelets 385.0; Potassium 3.6; Potassium 3.6; Potassium 3.6; Sodium 140; Sodium 140; Sodium 140   Lipid Panel    Component Value Date/Time   CHOL 156 11/17/2023 1049   TRIG 75.0 11/17/2023 1049   HDL 47.60 11/17/2023 1049   CHOLHDL 3 11/17/2023 1049   VLDL 15.0 11/17/2023 1049   LDLCALC 93 11/17/2023 1049   LDLCALC 122 (H) 09/20/2023 1349   LDLDIRECT 158.0 08/05/2022 1030     Wt Readings from Last 3 Encounters:  12/23/23 169 lb 6 oz (76.8 kg)  12/21/23 175 lb 11.3 oz (79.7 kg)  12/06/23 175 lb 12.8 oz (79.7 kg)    Assessment and Plan:   1. Pre-operative cardiovascular examination: She has a normal cardiac exam, normal EKG and normal LV function by echo yesterday. She is very active. No concerning cardiac symptoms. She can proceed with her planned surgical procedure.   2. Aortic valve insufficiency: Mild by echo yesterday. Repeat echo in 2 years.   3. SVT: No recent palpitations. She is post SVT ablation in 2022.   Labs/ tests ordered today include:  No orders of the defined types were placed in this encounter.  Disposition:   F/U with Dr. Okey in one year  Signed, Lonni Cash, MD, [facility] 12/23/2023 3:55 PM    [facility] [address] [place], [place], [place]  [address] Phone: [phone]; Fax: [phone]

## 2023-12-23 NOTE — Telephone Encounter (Signed)
 Inbound call from patient stating she would like to speak to nurse in regards to upcoming procedure on 8/11 and recently leaving her cardiologist appt. States she wants to let nurse know she was cleared to have procedure and just wants to check to see if nurse received that information   Requesting a call back Please advise Thank you

## 2023-12-23 NOTE — Patient Instructions (Signed)
 Medication Instructions:  No medication changes were made at this visit. Continue current regimen.   Follow-Up: At [facility], you and your health needs are our priority.  As part of our continuing mission to provide you with exceptional heart care, our providers are all part of one team.  This team includes your primary Cardiologist (physician) and Advanced Practice Providers or APPs (Physician Assistants and Nurse Practitioners) who all work together to provide you with the care you need, when you need it.  Your next appointment:   1 year(s)  Provider:   Lonni Cash, MD    We recommend signing up for the patient portal called MyChart.  Sign up information is provided on this After Visit Summary.  MyChart is used to connect with patients for Virtual Visits (Telemedicine).  Patients are able to view lab/test results, encounter notes, upcoming appointments, etc.  Non-urgent messages can be sent to your provider as well.   To learn more about what you can do with MyChart, go to [email or web address].

## 2023-12-23 NOTE — Telephone Encounter (Signed)
 See alternate note with further information

## 2023-12-26 ENCOUNTER — Ambulatory Visit (HOSPITAL_COMMUNITY): Admitting: Certified Registered Nurse Anesthetist

## 2023-12-26 ENCOUNTER — Ambulatory Visit (HOSPITAL_COMMUNITY)
Admission: RE | Admit: 2023-12-26 | Discharge: 2023-12-26 | Disposition: A | Discharge status: Home / Self Care | Attending: Gastroenterology | Admitting: Gastroenterology

## 2023-12-26 ENCOUNTER — Encounter (HOSPITAL_COMMUNITY): Payer: Self-pay | Admitting: Gastroenterology

## 2023-12-26 ENCOUNTER — Other Ambulatory Visit: Payer: Self-pay

## 2023-12-26 ENCOUNTER — Encounter (HOSPITAL_COMMUNITY)
Admission: RE | Disposition: A | Discharge status: Home / Self Care | Payer: Self-pay | Source: Home / Self Care | Attending: Gastroenterology

## 2023-12-26 DIAGNOSIS — K449 Diaphragmatic hernia without obstruction or gangrene: Secondary | ICD-10-CM | POA: Diagnosis not present

## 2023-12-26 DIAGNOSIS — I890 Lymphedema, not elsewhere classified: Secondary | ICD-10-CM

## 2023-12-26 DIAGNOSIS — C166 Malignant neoplasm of greater curvature of stomach, unspecified: Secondary | ICD-10-CM | POA: Diagnosis not present

## 2023-12-26 DIAGNOSIS — C162 Malignant neoplasm of body of stomach: Secondary | ICD-10-CM | POA: Diagnosis not present

## 2023-12-26 DIAGNOSIS — K219 Gastro-esophageal reflux disease without esophagitis: Secondary | ICD-10-CM | POA: Insufficient documentation

## 2023-12-26 DIAGNOSIS — K3189 Other diseases of stomach and duodenum: Secondary | ICD-10-CM

## 2023-12-26 DIAGNOSIS — K2289 Other specified disease of esophagus: Secondary | ICD-10-CM

## 2023-12-26 DIAGNOSIS — C163 Malignant neoplasm of pyloric antrum: Secondary | ICD-10-CM

## 2023-12-26 DIAGNOSIS — K279 Peptic ulcer, site unspecified, unspecified as acute or chronic, without hemorrhage or perforation: Secondary | ICD-10-CM | POA: Insufficient documentation

## 2023-12-26 DIAGNOSIS — K2950 Unspecified chronic gastritis without bleeding: Secondary | ICD-10-CM

## 2023-12-26 DIAGNOSIS — E119 Type 2 diabetes mellitus without complications: Secondary | ICD-10-CM | POA: Diagnosis not present

## 2023-12-26 DIAGNOSIS — D131 Benign neoplasm of stomach: Secondary | ICD-10-CM | POA: Diagnosis not present

## 2023-12-26 DIAGNOSIS — I471 Supraventricular tachycardia, unspecified: Secondary | ICD-10-CM

## 2023-12-26 DIAGNOSIS — F419 Anxiety disorder, unspecified: Secondary | ICD-10-CM | POA: Insufficient documentation

## 2023-12-26 DIAGNOSIS — I10 Essential (primary) hypertension: Secondary | ICD-10-CM | POA: Diagnosis not present

## 2023-12-26 DIAGNOSIS — E785 Hyperlipidemia, unspecified: Secondary | ICD-10-CM | POA: Insufficient documentation

## 2023-12-26 DIAGNOSIS — D3A8 Other benign neuroendocrine tumors: Secondary | ICD-10-CM

## 2023-12-26 DIAGNOSIS — K259 Gastric ulcer, unspecified as acute or chronic, without hemorrhage or perforation: Secondary | ICD-10-CM

## 2023-12-26 HISTORY — PX: ESOPHAGOGASTRODUODENOSCOPY: SHX5428

## 2023-12-26 HISTORY — PX: EUS: SHX5427

## 2023-12-26 LAB — GLUCOSE, CAPILLARY: Glucose-Capillary: 89 mg/dL (ref 70–99)

## 2023-12-26 SURGERY — EGD (ESOPHAGOGASTRODUODENOSCOPY)
Anesthesia: Monitor Anesthesia Care

## 2023-12-26 MED ORDER — GLYCOPYRROLATE PF 0.2 MG/ML IJ SOSY
PREFILLED_SYRINGE | INTRAMUSCULAR | Status: DC | PRN
  Administered 2023-12-26 (×2): .1 mg via INTRAVENOUS

## 2023-12-26 MED ORDER — PHENYLEPHRINE HCL (PRESSORS) 10 MG/ML IV SOLN
INTRAVENOUS | Status: DC | PRN
  Administered 2023-12-26: 120 ug via INTRAVENOUS
  Administered 2023-12-26 (×3): 40 ug via INTRAVENOUS
  Administered 2023-12-26: 80 ug via INTRAVENOUS
  Administered 2023-12-26: 40 ug via INTRAVENOUS
  Administered 2023-12-26: 80 ug via INTRAVENOUS
  Administered 2023-12-26: 120 ug via INTRAVENOUS
  Administered 2023-12-26 (×3): 80 ug via INTRAVENOUS
  Administered 2023-12-26: 40 ug via INTRAVENOUS
  Administered 2023-12-26: 80 ug via INTRAVENOUS
  Administered 2023-12-26: 40 ug via INTRAVENOUS

## 2023-12-26 MED ORDER — SODIUM CHLORIDE 0.9 % IV SOLN
INTRAVENOUS | Status: AC | PRN
  Administered 2023-12-26 (×2): 500 mL via INTRAMUSCULAR

## 2023-12-26 MED ORDER — LIDOCAINE HCL (PF) 2 % IJ SOLN
INTRAMUSCULAR | Status: DC | PRN
  Administered 2023-12-26 (×2): 100 mg via INTRADERMAL

## 2023-12-26 MED ORDER — SPOT INK MARKER SYRINGE KIT
PACK | SUBMUCOSAL | Status: DC | PRN
  Administered 2023-12-26 (×2): 2 mL via SUBMUCOSAL

## 2023-12-26 MED ORDER — PROPOFOL 500 MG/50ML IV EMUL
INTRAVENOUS | Status: DC | PRN
  Administered 2023-12-26 (×2): 150 ug/kg/min via INTRAVENOUS
  Administered 2023-12-26 (×2): 50 mg via INTRAVENOUS

## 2023-12-26 MED ORDER — SPOT INK MARKER SYRINGE KIT
PACK | SUBMUCOSAL | Status: AC
  Filled 2023-12-26: qty 5

## 2023-12-26 MED ORDER — PROPOFOL 500 MG/50ML IV EMUL
INTRAVENOUS | Status: AC
Start: 2023-12-26 — End: 2023-12-26
  Filled 2023-12-26: qty 100

## 2023-12-26 MED ORDER — SODIUM CHLORIDE 0.9 % IV SOLN: INTRAVENOUS | Status: DC

## 2023-12-26 NOTE — Op Note (Signed)
 [facility] Patient Name: Susan Whitaker Procedure Date: 12/26/2023 MRN: [id number] Attending MD: Aloha Finner , MD, [id number] Date of Birth: 11-15-1970 CSN: [id number] Age: 53 Admit Type: Outpatient Procedure:                Upper EUS Indications:              Suspected mass in stomach on CT scan, Pre-treatment                            staging of gastric adenocarcinoma, Submucosal tumor                            versus extrinsic mass found on endoscopy,                            Neuroendocrine Tumor Providers:                Aloha Finner, MD, Randall Lines, RN, [facility]                            [place], Technician, Vernell Molt, CRNA Referring MD:              Medicines:                Monitored Anesthesia Care Complications:            No immediate complications. Estimated Blood Loss:     Estimated blood loss was minimal. Procedure:                Pre-Anesthesia Assessment:                           - Prior to the procedure, a History and Physical                            was performed, and patient medications and                            allergies were reviewed. The patient's tolerance of                            previous anesthesia was also reviewed. The risks                            and benefits of the procedure and the sedation                            options and risks were discussed with the patient.                            All questions were answered, and informed consent                            was obtained. Prior Anticoagulants: The patient has  taken no anticoagulant or antiplatelet agents                            except for NSAID medication. ASA Grade Assessment:                            II - A patient with mild systemic disease. After                            reviewing the risks and benefits, the patient was                            deemed in satisfactory condition to undergo the                             procedure.                           After obtaining informed consent, the endoscope was                            passed under direct vision. Throughout the                            procedure, the patient's blood pressure, pulse, and                            oxygen saturations were monitored continuously. The                            [id number] ([id number]) Olympus endoscope was                            introduced through the mouth, and advanced to the                            second part of duodenum. The [id number] ([id number])                            Olympus endosonoscope was introduced through the                            mouth, and advanced to the duodenum for ultrasound                            examination from the stomach and duodenum. The                            upper EUS was accomplished without difficulty. The                            patient tolerated the procedure. Scope In: Scope Out: Findings:      ENDOSCOPIC FINDING: :      No gross lesions were noted in the  entire esophagus.      The Z-line was irregular and was found 38 cm from the incisors.      A 1 cm hiatal hernia was present.      A large, infiltrative and polypoid, partially circumferential (involving       one-third of the lumen circumference) mass with no bleeding and stigmata       of recent bleeding was found on the greater curvature of the very distal       gastric body extending into the antrum. There was evidence of partial       intussusception of the lesion into/out of the pyloric channel into the       duodenum. It is hard to distinguish the masslike tissue from the       adjacent pulled mucosa as a result of the significant change within the       area underneath. After EUS was completed, the area adjacent was tattooed       with an injection of Spot (carbon black).      A small post polypectomy scar was found on the greater curvature of the       gastric body.  Adjacent mucosal findings include ulcer. This appears to       be the area of previously biopsied gastric neuroendocrine tumor. After       rest of EUS was completed, preparations were made for mucosal resection.       Demarcation of the area was performed with high-definition white light       and narrow band imaging to clearly identify the boundaries of the       lesion. Saline was injected to raise the lesion. Snare mucosal resection       was performed. Resection and retrieval were complete. Resected tissue       margins were examined and clear of polyp tissue. To prevent bleeding       post-intervention, two hemostatic clips were successfully placed (MR       conditional). Clip manufacturer: [employer]. There was no       bleeding at the end of the procedure.      Patchy mildly erythematous mucosa without bleeding was found in the       entire examined stomach. Previously biopsied.      No gross lesions were noted in the duodenal bulb, in the first portion       of the duodenum and in the second portion of the duodenum.      ENDOSONOGRAPHIC FINDING: :      A hypoechoic lobulated mass was identified endosonographically in the       distal greater curve of the body of stomach extending into the antrum of       the stomach. The mass measured 37 mm by 24 mm in maximal cross-sectional       diameter. The outer margins were smooth. There was sonographic evidence       suggesting invasion into the luminal interface/superficial mucosa (Layer       1), the deep mucosa (Layer 2) and the submucosa (Layer 3). There was not       evidence that the muscularis propria (layer 4 was involved).      Endosonographic images of the rest of the stomach were unremarkable. No       masses and no wall thickening were identified.      There was no sign of significant endosonographic abnormality in the  gastroesophageal junction. No masses and no wall thickening were       identified.      No  malignant-appearing lymph nodes were visualized in the left gastric       region (level 17), gastrohepatic ligament (level 18), celiac region       (level 20), perigastric region and porta hepatis region.      Endosonographic imaging in the visualized portion of the liver showed no       mass.      The celiac region was visualized. Impression:               EGD impression:                           - No gross lesions in the entire esophagus. Z-line                            irregular, 38 cm from the incisors.                           - 1 cm hiatal hernia.                           - Previously known malignant gastric tumor on the                            greater curvature of the gastric body extending to                            the gastric antrum, with evidence of partial                            intussusception in/out of the pyloric channel into                            the duodenum. There is extensive mucosal change                            surrounding this area (not clear if this is a                            result of pulling of the mass versus true                            dysplastic changes surrounding the more masslike                            area. After EUS, the area adjacent was tattooed.                           - Scar in the gastric body (greater curvature). EMR                            performed of the ulcerated area. Clips (MR  conditional) were placed. Clip manufacturer: [employer].                           - Erythematous mucosa in the stomach.                           - No gross lesions in the duodenal bulb, in the                            first portion of the duodenum and in the second                            portion of the duodenum.                           EUS impression:                           - A mass was found in the greater curve of the                            stomach  extending into the antrum. A tissue                            diagnosis was obtained prior to this exam. This is                            consistent with adenoma with extensive high-grade                            dysplasia and evidence of invasive adenocarcinoma.                            This was staged T1sm N0 Mx by endosonographic                            criteria. The muscularis propria does not look to                            be involved.                           - Endosonographic images of the rest of the stomach                            were unremarkable.                           - There was no sign of significant pathology in the                            gastroesophageal junction.                           -  No malignant-appearing lymph nodes were                            visualized in the left gastric region (level 17),                            gastrohepatic ligament (level 18), celiac region                            (level 20), perigastric region and porta hepatis                            region. Moderate Sedation:      Not Applicable - Patient had care per Anesthesia. Recommendation:           - The patient will be observed post-procedure,                            until all discharge criteria are met.                           - Discharge patient to home.                           - Patient has a contact number available for                            emergencies. The signs and symptoms of potential                            delayed complications were discussed with the                            patient. Return to normal activities tomorrow.                            Written discharge instructions were provided to the                            patient.                           - Advance diet as tolerated.                           - Observe patient's clinical course.                           - Await path results.                           -  Will discuss with Dr. Aron and Dr. Charlanne                            results for consideration of further management.                           -  Follow-up to be recommended based on final                            pathology findings and interventions performed of                            the gastric mass.                           - The findings and recommendations were discussed                            with the patient.                           - The findings and recommendations were discussed                            with the patient's family. Procedure Code(s):        --- Professional ---                           [phone], Esophagogastroduodenoscopy, flexible,                            transoral; with endoscopic mucosal resection                           43259, Esophagogastroduodenoscopy, flexible,                            transoral; with endoscopic ultrasound examination,                            including the esophagus, stomach, and either the                            duodenum or a surgically altered stomach where the                            jejunum is examined distal to the anastomosis Diagnosis Code(s):        --- Professional ---                           K22.89, Other specified disease of esophagus                           K44.9, Diaphragmatic hernia without obstruction or                            gangrene                           K31.89, Other diseases of stomach and duodenum                           C16.2, Malignant neoplasm  of body of stomach                           C16.3, Malignant neoplasm of pyloric antrum                           I89.9, Noninfective disorder of lymphatic vessels                            and lymph nodes, unspecified                           K92.9, Disease of digestive system, unspecified                           R93.3, Abnormal findings on diagnostic imaging of                            other parts of digestive tract CPT  copyright 2022 American Medical Association. All rights reserved. The codes documented in this report are preliminary and upon coder review may  be revised to meet current compliance requirements. Aloha Finner, MD 12/26/2023 12:27:59 PM Number of Addenda: 0

## 2023-12-26 NOTE — Discharge Instructions (Addendum)
 YOU HAD AN ENDOSCOPIC PROCEDURE TODAY: Refer to the procedure report and other information in the discharge instructions given to you for any specific questions about what was found during the examination. If this information does not answer your questions, please call [place] office at [phone] to clarify.   YOU SHOULD EXPECT: Some feelings of bloating in the abdomen. Passage of more gas than usual. Walking can help get rid of the air that was put into your GI tract during the procedure and reduce the bloating. If you had a lower endoscopy (such as a colonoscopy or flexible sigmoidoscopy) you may notice spotting of blood in your stool or on the toilet paper. Some abdominal soreness may be present for a day or two, also.  DIET: Your first meal following the procedure should be a full liquid diet and then advance diet as tolerated. Protein shakes will be good to start with and then soft foods by this evening. Heavy or fried foods are harder to digest and may make you feel nauseous or bloated. Drink plenty of fluids but you should avoid alcoholic beverages for 24 hours. If you had a esophageal dilation, please see attached instructions for diet.    ACTIVITY: Your care partner should take you home directly after the procedure. You should plan to take it easy, moving slowly for the rest of the day. You can resume normal activity the day after the procedure however YOU SHOULD NOT DRIVE, use power tools, machinery or perform tasks that involve climbing or major physical exertion for 24 hours (because of the sedation medicines used during the test).   SYMPTOMS TO REPORT IMMEDIATELY: A gastroenterologist can be reached at any hour. Please call [phone]  for any of the following symptoms:  Following lower endoscopy (colonoscopy, flexible sigmoidoscopy) Excessive amounts of blood in the stool  Significant tenderness, worsening of abdominal pains  Swelling of the abdomen that is new, acute  Fever of 100  or higher  Following upper endoscopy (EGD, EUS, ERCP, esophageal dilation) Vomiting of blood or coffee ground material  New, significant abdominal pain  New, significant chest pain or pain under the shoulder blades  Painful or persistently difficult swallowing  New shortness of breath  Black, tarry-looking or red, bloody stools  FOLLOW UP:  If any biopsies were taken you will be contacted by phone or by letter within the next 1-3 weeks. Call [phone]  if you have not heard about the biopsies in 3 weeks.  Please also call with any specific questions about appointments or follow up tests.

## 2023-12-26 NOTE — Transfer of Care (Signed)
 Immediate Anesthesia Transfer of Care Note  Patient: Susan Whitaker  Procedure(s) Performed: Procedure(s): ULTRASOUND, UPPER GI TRACT, ENDOSCOPIC (N/A) EGD (ESOPHAGOGASTRODUODENOSCOPY) (N/A)  Patient Location: PACU  Anesthesia Type:MAC  Level of Consciousness: Patient easily awoken, comfortable, cooperative, following commands, responds to stimulation.   Airway & Oxygen Therapy: Patient spontaneously breathing, ventilating well, oxygen via simple oxygen mask.  Post-op Assessment: Report given to PACU RN, vital signs reviewed and stable, moving all extremities.   Post vital signs: Reviewed and stable.  Complications: No apparent anesthesia complications Last Vitals:  Vitals Value Taken Time  BP 104/56 12/26/23 12:08  Temp    Pulse 58 12/26/23 12:09  Resp 17 12/26/23 12:09  SpO2 100 % 12/26/23 12:09  Vitals shown include unfiled device data.  Last Pain:  Vitals:   12/26/23 1037  TempSrc: Temporal  PainSc: 0-No pain         Complications: No notable events documented.

## 2023-12-26 NOTE — H&P (Signed)
 GASTROENTEROLOGY PROCEDURE H&P NOTE   Primary Care Physician: Katheen Roselie Rockford, NP  HPI: Susan Whitaker is a 53 y.o. female who presents for EGD/EUS to evaluate gastric polyp with adenocarcinoma and gastric nodules consistent with NET.  Past Medical History:  Diagnosis Date   Abdominal bloating 09/14/2020   Abdominal pain 10/04/2020   Class 2 obesity due to excess calories with body mass index (BMI) of 35.0 to 35.9 in adult 06/2010   DM type 2 (diabetes mellitus, type 2) (HCC)    Gastric ulcer    GERD (gastroesophageal reflux disease)    H/O constipation    H/O hemorrhoids    History of small bowel obstruction 06/02/2019   Hyperlipidemia    Hypertension    Hypokalemia 2019   Menorrhagia    Microalbuminuria 02/15/2012   Primary hyperaldosteronism (HCC) 02/21/2012   S/P radiofrequency ablation operation for arrhythmia 10/03/20 10/04/2020   SVT (supraventricular tachycardia) (HCC)    Noted 11/2011 admission   SVT (supraventricular tachycardia) (HCC) 12/29/2011   Thrombocytopenia (HCC)    Type 2 diabetes mellitus with obesity (HCC) 11/17/2020   IMO SNOMED Dx Update Oct 2024     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   Past Surgical History:  Procedure Laterality Date   ABDOMINAL ADHESION SURGERY  06/03/2019   Dr Curvin   ABDOMINAL SURGERY     52 months of age-- unsure of type of surgery   COLONOSCOPY  04/2016   hemorrhoids--normal per pt with Dr Kristie   DILATATION & CURETTAGE/HYSTEROSCOPY WITH MYOSURE N/A 02/08/2020   Procedure: DILATATION & CURETTAGE/HYSTEROSCOPY WITH MYOSURE;  Surgeon: Gloriann Chick, MD;  Location: [facility];  Service: Gynecology;  Laterality: N/A;   HEMORRHOID SURGERY  2005/2006   INTRAUTERINE DEVICE (IUD) INSERTION N/A 02/08/2020   Procedure: INTRAUTERINE DEVICE (IUD) INSERTION UNDER ULTRASOUND GUIDANCE;  Surgeon: Gloriann Chick, MD;  Location: [facility];  Service: Gynecology;  Laterality: N/A;  Mirena    LAPAROTOMY N/A 06/03/2019    Procedure: EXPLORATORY LAPAROTOMY WITH LYSIS OF ADHESIONS;  Surgeon: Curvin Deward MOULD, MD;  Location: [facility];  Service: General;  Laterality: N/A;   SVT ABLATION N/A 10/03/2020   Procedure: SVT ABLATION;  Surgeon: Waddell Danelle ORN, MD;  Location: [facility];  Service: Cardiovascular;  Laterality: N/A;   UPPER GASTROINTESTINAL ENDOSCOPY     UTERINE FIBROID EMBOLIZATION  2010   at [facility]   Current Facility-Administered Medications  Medication Dose Route Frequency Provider Last Rate Last Admin   0.9 %  sodium chloride  infusion   Intravenous Continuous Mansouraty, Aloha Raddle., MD        Current Facility-Administered Medications:    0.9 %  sodium chloride  infusion, , Intravenous, Continuous, Mansouraty, Aloha Raddle., MD Allergies  Allergen Reactions   Lisinopril Cough   Family History  Problem Relation Age of Onset   Cancer Mother 52       breast   Stroke Mother    Vision loss Father        some   Heart disease Father        Rhythm disturbance   Hypertension Sister    Hypertension Brother    Hypertension Brother    Cancer Maternal Aunt 54       Breast   Cancer Maternal Grandmother 56       breast   Hypertension Maternal Grandmother    Cancer Other        breast, lung   Hypertension Other    Stroke Other  Diabetes Neg Hx    Colon cancer Neg Hx    Esophageal cancer Neg Hx    Stomach cancer Neg Hx    Rectal cancer Neg Hx    Social History   Socioeconomic History   Marital status: Single    Spouse name: Not on file   Number of children: 2   Years of education: Not on file   Highest education level: Not on file  Occupational History   Occupation: YOUTH EMPLOYMENT    Employer: [employer]   Occupation: egineering  Tobacco Use   Smoking status: Never   Smokeless tobacco: Never  Vaping Use   Vaping status: Never Used  Substance and Sexual Activity   Alcohol  use: No   Drug use: No   Sexual activity: Not on file  Other Topics Concern   Not on file  Social  History Narrative   Works at [employer]         Social Drivers of Corporate investment banker Strain: Not on file  Food Insecurity: Food Insecurity Present (11/30/2023)   Hunger Vital Sign    Worried About Running Out of Food in the Last Year: Sometimes true    Ran Out of Food in the Last Year: Sometimes true  Transportation Needs: No Transportation Needs (11/30/2023)   PRAPARE - Administrator, Civil Service (Medical): No    Lack of Transportation (Non-Medical): No  Physical Activity: Not on file  Stress: Not on file  Social Connections: Not on file  Intimate Partner Violence: Not At Risk (11/30/2023)   Humiliation, Afraid, Rape, and Kick questionnaire    Fear of Current or Ex-Partner: No    Emotionally Abused: No    Physically Abused: No    Sexually Abused: No    Physical Exam: Today's Vitals   12/20/23 1050  Weight: 79.7 kg   Body mass index is 31.13 kg/m. GEN: NAD EYE: Sclerae anicteric ENT: MMM CV: Non-tachycardic GI: Soft, NT/ND NEURO:  Alert & Oriented x 3  Lab Results: No results for input(s): WBC, HGB, HCT, PLT in the last 72 hours. BMET No results for input(s): NA, K, CL, CO2, GLUCOSE, BUN, CREATININE, CALCIUM  in the last 72 hours. LFT No results for input(s): PROT, ALBUMIN , AST, ALT, ALKPHOS, BILITOT, BILIDIR, IBILI in the last 72 hours. PT/INR No results for input(s): LABPROT, INR in the last 72 hours.   Impression / Plan: This is a 53 y.o.female who presents for EGD/EUS to evaluate gastric polyp with adenocarcinoma and gastric nodules consistent with NET.  The risks of an EUS including intestinal perforation, bleeding, infection, aspiration, and medication effects were discussed as was the possibility it may not give a definitive diagnosis if a biopsy is performed.  When a biopsy of the pancreas is done as part of the EUS, there is an additional risk of pancreatitis at the rate of about  1-2%.  It was explained that procedure related pancreatitis is typically mild, although it can be severe and even life threatening, which is why we do not perform random pancreatic biopsies and only biopsy a lesion/area we feel is concerning enough to warrant the risk.  Based upon the description and endoscopic pictures I do feel that it is reasonable to pursue an Advanced Polypectomy attempt of the polyp/lesion.  We discussed some of the techniques of advanced polypectomy which include Endoscopic Mucosal Resection, OVESCO Full-Thickness Resection, Endorotor Morcellation, and Tissue Ablation via Fulguration.  We also reviewed images of typical techniques as  noted above.  The risks and benefits of endoscopic evaluation were discussed with the patient; these include but are not limited to the risk of perforation, infection, bleeding, missed lesions, lack of diagnosis, severe illness requiring hospitalization, as well as anesthesia and sedation related illnesses.  During attempts at advanced resection, the risks of bleeding and perforation/leak are increased as opposed to diagnostic and screening procedures, and that was discussed with the patient as well.   In addition, I explained that with the possible need for piecemeal resection, subsequent short-interval endoscopic evaluation for follow up and potential retreatment of the lesion/area may be necessary. If, after attempt at removal of the polyp/lesion, it is found that the patient has a complication or that an invasive lesion or malignant lesion is found, or that the polyp/lesion continues to recur, the patient is aware and understands that surgery may still be indicated/required.  All patient questions were answered, to the best of my ability, and the patient agrees to the aforementioned plan of action with follow-up as indicated.   The risks and benefits of endoscopic evaluation/treatment were discussed with the patient and/or family; these include but are  not limited to the risk of perforation, infection, bleeding, missed lesions, lack of diagnosis, severe illness requiring hospitalization, as well as anesthesia and sedation related illnesses.  The patient's history has been reviewed, patient examined, no change in status, and deemed stable for procedure.  The patient and/or family is agreeable to proceed.    Aloha Finner, MD [facility] Advanced Endoscopy Office # [phone]

## 2023-12-26 NOTE — Anesthesia Postprocedure Evaluation (Signed)
 Anesthesia Post Note  Patient: Soundra LITTIE Collum  Procedure(s) Performed: ULTRASOUND, UPPER GI TRACT, ENDOSCOPIC EGD (ESOPHAGOGASTRODUODENOSCOPY)     Patient location during evaluation: Endoscopy Anesthesia Type: MAC Level of consciousness: awake and alert Pain management: pain level controlled Vital Signs Assessment: post-procedure vital signs reviewed and stable Respiratory status: spontaneous breathing, nonlabored ventilation, respiratory function stable and patient connected to nasal cannula oxygen Cardiovascular status: blood pressure returned to baseline and stable Postop Assessment: no apparent nausea or vomiting Anesthetic complications: no   No notable events documented.  Last Vitals:  Vitals:   12/26/23 1220 12/26/23 1230  BP: 120/63 113/63  Pulse: 93 76  Resp: (!) 22 17  Temp:    SpO2: 100% 100%    Last Pain:  Vitals:   12/26/23 1230  TempSrc:   PainSc: 0-No pain                 Rome Ade

## 2023-12-26 NOTE — Anesthesia Preprocedure Evaluation (Signed)
 Anesthesia Evaluation  Patient identified by MRN, date of birth, ID band Patient awake    Reviewed: Allergy  & Precautions, NPO status , Patient's Chart, lab work & pertinent test results  History of Anesthesia Complications Negative for: history of anesthetic complications  Airway Mallampati: II  TM Distance: >3 FB Neck ROM: Full    Dental  (+) Upper Dentures   Pulmonary neg pulmonary ROS, neg sleep apnea, neg COPD, Patient abstained from smoking.Not current smoker   Pulmonary exam normal breath sounds clear to auscultation       Cardiovascular Exercise Tolerance: Good METShypertension, Pt. on medications (-) CAD and (-) Past MI (-) dysrhythmias  Rhythm:Regular Rate:Normal - Systolic murmurs    Neuro/Psych  PSYCHIATRIC DISORDERS Anxiety     negative neurological ROS     GI/Hepatic PUD,GERD  Medicated and Controlled,,(+)     (-) substance abuse    Endo/Other  diabetes  Patient on GLP1, last taken 10 days ago. Denies GI symptoms today  Renal/GU negative Renal ROS     Musculoskeletal   Abdominal   Peds  Hematology   Anesthesia Other Findings Past Medical History: 09/14/2020: Abdominal bloating 10/04/2020: Abdominal pain 06/2010: Class 2 obesity due to excess calories with body mass index  (BMI) of 35.0 to 35.9 in adult No date: DM type 2 (diabetes mellitus, type 2) (HCC) No date: Gastric ulcer No date: GERD (gastroesophageal reflux disease) No date: H/O constipation No date: H/O hemorrhoids 06/02/2019: History of small bowel obstruction No date: Hyperlipidemia No date: Hypertension 2019: Hypokalemia No date: Menorrhagia 02/15/2012: Microalbuminuria 02/21/2012: Primary hyperaldosteronism (HCC) 10/04/2020: S/P radiofrequency ablation operation for arrhythmia 5/20/ 22 No date: SVT (supraventricular tachycardia) (HCC)     Comment:  Noted 11/2011 admission 12/29/2011: SVT (supraventricular tachycardia)  (HCC) No date: Thrombocytopenia (HCC) 11/17/2020: Type 2 diabetes mellitus with obesity (HCC)     Comment:  IMO SNOMED Dx Update Oct 2024   09/15/2020: Volvulus (HCC)  Reproductive/Obstetrics                              Anesthesia Physical Anesthesia Plan  ASA: 2  Anesthesia Plan: MAC   Post-op Pain Management: Minimal or no pain anticipated   Induction: Intravenous  PONV Risk Score and Plan: 2 and Propofol  infusion, TIVA and Ondansetron   Airway Management Planned: Nasal Cannula  Additional Equipment: None  Intra-op Plan:   Post-operative Plan:   Informed Consent: I have reviewed the patients History and Physical, chart, labs and discussed the procedure including the risks, benefits and alternatives for the proposed anesthesia with the patient or authorized representative who has indicated his/her understanding and acceptance.     Dental advisory given  Plan Discussed with: CRNA and Surgeon  Anesthesia Plan Comments: (Discussed risks of anesthesia with patient, including possibility of difficulty with spontaneous ventilation under anesthesia necessitating airway intervention, PONV, and rare risks such as cardiac or respiratory or neurological events, and allergic reactions. Discussed the role of CRNA in patient's perioperative care. Patient understands.)        Anesthesia Quick Evaluation

## 2023-12-26 NOTE — Anesthesia Procedure Notes (Signed)
 Procedure Name: MAC Date/Time: 12/26/2023 11:03 AM  Performed by: Joshua Vernell BROCKS, CRNAPre-anesthesia Checklist: Patient identified, Emergency Drugs available, Suction available and Patient being monitored Patient Re-evaluated:Patient Re-evaluated prior to induction Oxygen Delivery Method: Simple face mask Placement Confirmation: positive ETCO2 and breath sounds checked- equal and bilateral Dental Injury: Teeth and Oropharynx as per pre-operative assessment

## 2023-12-27 ENCOUNTER — Encounter (HOSPITAL_COMMUNITY): Payer: Self-pay | Admitting: Gastroenterology

## 2023-12-27 LAB — SURGICAL PATHOLOGY

## 2023-12-28 NOTE — Progress Notes (Signed)
Thanks a lot RG 

## 2023-12-30 ENCOUNTER — Ambulatory Visit: Payer: Self-pay | Admitting: Gastroenterology

## 2023-12-30 ENCOUNTER — Other Ambulatory Visit: Payer: Self-pay | Admitting: General Surgery

## 2023-12-31 DIAGNOSIS — C163 Malignant neoplasm of pyloric antrum: Secondary | ICD-10-CM | POA: Diagnosis present

## 2024-01-04 NOTE — Telephone Encounter (Addendum)
 Sent patient cancer claim form to [phone] all 17 pages with letter head 2 sheets of form to fill out and procedure notes from Dr Charlanne and Dr CHRISTELLA and patient has been notified

## 2024-01-06 NOTE — Pre-Procedure Instructions (Signed)
 Surgical Instructions   Your procedure is scheduled on January 11, 2024. Report to [facility] Main Entrance A at 10:15 A.M., then check in with the Admitting office. Any questions or running late day of surgery: call [phone]  Questions prior to your surgery date: call [phone], Monday-Friday, 8am-4pm. If you experience any cold or flu symptoms such as cough, fever, chills, shortness of breath, etc. between now and your scheduled surgery, please notify us  at the above number.     Remember:  Do not eat after midnight the night before your surgery   You may drink clear liquids until 9:15 AM the morning of your surgery.   Clear liquids allowed are: Water, Non-Citrus Juices (without pulp), Carbonated Beverages, Clear Tea (no milk, honey, etc.), Black Coffee Only (NO MILK, CREAM OR POWDERED CREAMER of any kind), and Gatorade.    Take these medicines the morning of surgery with A SIP OF WATER: amLODipine  (NORVASC )  atorvastatin  (LIPITOR)  pantoprazole  (PROTONIX )    May take these medicines IF NEEDED: acetaminophen  (TYLENOL )    One week prior to surgery, STOP taking any Aspirin  (unless otherwise instructed by your surgeon) Aleve , Naproxen , Ibuprofen , Motrin , Advil , Goody's, BC's, all herbal medications, fish oil, and non-prescription vitamins.   WHAT DO I DO ABOUT MY DIABETES MEDICATION?      STOP taking your canagliflozin  (INVOKANA ) three days prior to surgery. Your last dose will be August 23rd.  STOP taking your tirzepatide  (MOUNJARO ) one week prior to surgery. DO NOT take any doses after August 19th.   HOW TO MANAGE YOUR DIABETES BEFORE AND AFTER SURGERY  Why is it important to control my blood sugar before and after surgery? Improving blood sugar levels before and after surgery helps healing and can limit problems. A way of improving blood sugar control is eating a healthy diet by:  Eating less sugar and carbohydrates  Increasing activity/exercise  Talking with  your doctor about reaching your blood sugar goals High blood sugars (greater than 180 mg/dL) can raise your risk of infections and slow your recovery, so you will need to focus on controlling your diabetes during the weeks before surgery. Make sure that the doctor who takes care of your diabetes knows about your planned surgery including the date and location.  How do I manage my blood sugar before surgery? Check your blood sugar at least 4 times a day, starting 2 days before surgery, to make sure that the level is not too high or low.  Check your blood sugar the morning of your surgery when you wake up and every 2 hours until you get to the Short Stay unit.  If your blood sugar is less than 70 mg/dL, you will need to treat for low blood sugar: Do not take insulin . Treat a low blood sugar (less than 70 mg/dL) with  cup of clear juice (cranberry or apple), 4 glucose tablets, OR glucose gel. Recheck blood sugar in 15 minutes after treatment (to make sure it is greater than 70 mg/dL). If your blood sugar is not greater than 70 mg/dL on recheck, call [phone] for further instructions. Report your blood sugar to the short stay nurse when you get to Short Stay.  If you are admitted to the hospital after surgery: Your blood sugar will be checked by the staff and you will probably be given insulin  after surgery (instead of oral diabetes medicines) to make sure you have good blood sugar levels. The goal for blood sugar control after surgery is 80-180 mg/dL.  Do NOT Smoke (Tobacco/Vaping) for 24 hours prior to your procedure.  If you use a CPAP at night, you may bring your mask/headgear for your overnight stay.   You will be asked to remove any contacts, glasses, piercing's, hearing aid's, dentures/partials prior to surgery. Please bring cases for these items if needed.    Patients discharged the day of surgery will not be allowed to drive home, and someone needs to stay  with them for 24 hours.  SURGICAL WAITING ROOM VISITATION Patients may have no more than 2 support people in the waiting area - these visitors may rotate.   Pre-op nurse will coordinate an appropriate time for 1 ADULT support person, who may not rotate, to accompany patient in pre-op.  Children under the age of 67 must have an adult with them who is not the patient and must remain in the main waiting area with an adult.  If the patient needs to stay at the hospital during part of their recovery, the visitor guidelines for inpatient rooms apply.  Please refer to the [facility] website for the visitor guidelines for any additional information.   If you received a COVID test during your pre-op visit  it is requested that you wear a mask when out in public, stay away from anyone that may not be feeling well and notify your surgeon if you develop symptoms. If you have been in contact with anyone that has tested positive in the last 10 days please notify you surgeon.      Pre-operative CHG Bathing Instructions   You can play a key role in reducing the risk of infection after surgery. Your skin needs to be as free of germs as possible. You can reduce the number of germs on your skin by washing with CHG (chlorhexidine  gluconate) soap before surgery. CHG is an antiseptic soap that kills germs and continues to kill germs even after washing.   DO NOT use if you have an allergy  to chlorhexidine /CHG or antibacterial soaps. If your skin becomes reddened or irritated, stop using the CHG and notify one of our RNs at [phone].              TAKE A SHOWER THE NIGHT BEFORE SURGERY AND THE DAY OF SURGERY    Please keep in mind the following:  DO NOT shave, including legs and underarms, 48 hours prior to surgery.   You may shave your face before/day of surgery.  Place clean sheets on your bed the night before surgery Use a clean washcloth (not used since being washed) for each shower. DO NOT sleep with  pet's night before surgery.  CHG Shower Instructions:  Wash your face and private area with normal soap. If you choose to wash your hair, wash first with your normal shampoo.  After you use shampoo/soap, rinse your hair and body thoroughly to remove shampoo/soap residue.  Turn the water OFF and apply half the bottle of CHG soap to a CLEAN washcloth.  Apply CHG soap ONLY FROM YOUR NECK DOWN TO YOUR TOES (washing for 3-5 minutes)  DO NOT use CHG soap on face, private areas, open wounds, or sores.  Pay special attention to the area where your surgery is being performed.  If you are having back surgery, having someone wash your back for you may be helpful. Wait 2 minutes after CHG soap is applied, then you may rinse off the CHG soap.  Pat dry with a clean towel  Put on clean pajamas  Additional instructions for the day of surgery: DO NOT APPLY any lotions, deodorants, cologne, or perfumes.   Do not wear jewelry or makeup Do not wear nail polish, gel polish, artificial nails, or any other type of covering on natural nails (fingers and toes) Do not bring valuables to the hospital. [facility] is not responsible for valuables/personal belongings. Put on clean/comfortable clothes.  Please brush your teeth.  Ask your nurse before applying any prescription medications to the skin.

## 2024-01-09 ENCOUNTER — Encounter (HOSPITAL_COMMUNITY): Payer: Self-pay

## 2024-01-09 ENCOUNTER — Other Ambulatory Visit: Payer: Self-pay

## 2024-01-09 ENCOUNTER — Encounter (HOSPITAL_COMMUNITY)
Admission: RE | Admit: 2024-01-09 | Discharge: 2024-01-09 | Disposition: A | Discharge status: Home / Self Care | Source: Ambulatory Visit | Attending: General Surgery | Admitting: General Surgery

## 2024-01-09 VITALS — BP 122/66 | HR 80 | Temp 97.9°F | Resp 16 | Ht 63.0 in | Wt 168.4 lb

## 2024-01-09 DIAGNOSIS — I2510 Atherosclerotic heart disease of native coronary artery without angina pectoris: Secondary | ICD-10-CM

## 2024-01-09 DIAGNOSIS — E119 Type 2 diabetes mellitus without complications: Secondary | ICD-10-CM | POA: Insufficient documentation

## 2024-01-09 DIAGNOSIS — I471 Supraventricular tachycardia, unspecified: Secondary | ICD-10-CM | POA: Insufficient documentation

## 2024-01-09 DIAGNOSIS — I351 Nonrheumatic aortic (valve) insufficiency: Secondary | ICD-10-CM | POA: Insufficient documentation

## 2024-01-09 DIAGNOSIS — E2609 Other primary hyperaldosteronism: Secondary | ICD-10-CM | POA: Insufficient documentation

## 2024-01-09 DIAGNOSIS — E785 Hyperlipidemia, unspecified: Secondary | ICD-10-CM | POA: Insufficient documentation

## 2024-01-09 DIAGNOSIS — Z01812 Encounter for preprocedural laboratory examination: Secondary | ICD-10-CM | POA: Insufficient documentation

## 2024-01-09 DIAGNOSIS — E876 Hypokalemia: Secondary | ICD-10-CM | POA: Insufficient documentation

## 2024-01-09 DIAGNOSIS — K219 Gastro-esophageal reflux disease without esophagitis: Secondary | ICD-10-CM | POA: Insufficient documentation

## 2024-01-09 DIAGNOSIS — I10 Essential (primary) hypertension: Secondary | ICD-10-CM | POA: Insufficient documentation

## 2024-01-09 DIAGNOSIS — Z01818 Encounter for other preprocedural examination: Secondary | ICD-10-CM

## 2024-01-09 DIAGNOSIS — C169 Malignant neoplasm of stomach, unspecified: Secondary | ICD-10-CM | POA: Insufficient documentation

## 2024-01-09 HISTORY — DX: Cardiac arrhythmia, unspecified: I49.9

## 2024-01-09 LAB — CBC
HCT: 41.6 % (ref 36.0–46.0)
Hemoglobin: 13.7 g/dL (ref 12.0–15.0)
MCH: 29 pg (ref 26.0–34.0)
MCHC: 32.9 g/dL (ref 30.0–36.0)
MCV: 88.1 fL (ref 80.0–100.0)
Platelets: 437 K/uL — ABNORMAL HIGH (ref 150–400)
RBC: 4.72 MIL/uL (ref 3.87–5.11)
RDW: 14.6 % (ref 11.5–15.5)
WBC: 8.2 K/uL (ref 4.0–10.5)
nRBC: 0 % (ref 0.0–0.2)

## 2024-01-09 LAB — TYPE AND SCREEN
ABO/RH(D): O POS
Antibody Screen: NEGATIVE

## 2024-01-09 LAB — HEMOGLOBIN A1C
Hgb A1c MFr Bld: 5.8 % — ABNORMAL HIGH (ref 4.8–5.6)
Mean Plasma Glucose: 119.76 mg/dL

## 2024-01-09 LAB — BASIC METABOLIC PANEL WITH GFR
Anion gap: 9 (ref 5–15)
BUN: 17 mg/dL (ref 6–20)
CO2: 28 mmol/L (ref 22–32)
Calcium: 9.5 mg/dL (ref 8.9–10.3)
Chloride: 103 mmol/L (ref 98–111)
Creatinine, Ser: 0.97 mg/dL (ref 0.44–1.00)
GFR, Estimated: >60 mL/min (ref >60)
Glucose, Bld: 104 mg/dL — ABNORMAL HIGH (ref 70–99)
Potassium: 3.2 mmol/L — ABNORMAL LOW (ref 3.5–5.1)
Sodium: 140 mmol/L (ref 135–145)

## 2024-01-09 LAB — GLUCOSE, CAPILLARY: Glucose-Capillary: 118 mg/dL — ABNORMAL HIGH (ref 70–99)

## 2024-01-09 NOTE — Progress Notes (Signed)
 PCP - Roselie Bishop Mood, NP Cardiologist - Dr. Vina Gull - last office visit 12/23/2023  PPM/ICD - Denies Device Orders - n/a Rep Notified - n/a  Chest x-ray - n/a EKG - 12/21/2023 Stress Test - 12/22/2011 ECHO - 12/22/2023 Cardiac Cath - Denies  Sleep Study - Denies CPAP - n/a  Pt is DM2. She checks blood sugar about 1x/week. Normal fasting in 90s. CBG at pre-op appointment 118. A1c result pending.  Last dose of GLP1 agonist- Last dose of Mounjaro  was roughly two weeks ago. GLP1 instructions: Pt instructed to continue to hold medication until after surgery. SGLT-2 Instructions: Pt instructed to hold Invokana  for three days. Last dose was today, August 25th.  Blood Thinner Instructions: n/a Aspirin  Instructions: n/a  ERAS Protcol - Clear liquids until 0915 morning of surgery PRE-SURGERY Ensure or G2- n/a  COVID TEST- n/a   Anesthesia review: Yes. Cardiac clearance. Hx of HTN, SVT s/p ablation, DM2.   Patient denies shortness of breath, fever, cough and chest pain at PAT appointment. Pt denies any respiratory illness/infection in the last two months.    All instructions explained to the patient, with a verbal understanding of the material. Patient agrees to go over the instructions while at home for a better understanding. Patient also instructed to self quarantine after being tested for COVID-19. The opportunity to ask questions was provided.

## 2024-01-09 NOTE — H&P (Signed)
 PROVIDER: JINA CLAIR NEPHEW, MD Patient Care Team: Nche, Roselie Rockford, NP as PCP - General (Internal Medicine) Teresa Lonni Sharper, MD as Consulting Provider (General Surgery)  MRN: [id number] DOB: 05/22/1970 DATE OF ENCOUNTER: 12/31/2023  Chief Complaint: No chief complaint on file.   History of Present Illness: Susan Whitaker is a 53 y.o. female who is seen today for gastric cancer.  Initial history:   Susan Whitaker is a 53 year old female who presented 11/2023 with adenocarcinoma in situ in a gastric polyp  She experienced persistent intermittent abdominal pain since her surgery in January 2021 for a bowel obstruction. The pain is intermittent, with severity ranging from 3 to 7 out of 10. She is cautious about her diet to avoid hospital readmission.  Recently a CT scan and endoscopy revealed a mass. The endoscopy showed a polyp with dysplasia and a small focus of cancer. She has not taken any gastric medications like Protonix  or Pepcid and experiences intermittent nausea but no reflux.  Her medical history includes bowel obstruction surgeries at birth and in January 2021. She recalls having stomach issues prior to the 2021 surgery but not as severe. She experienced a sharp pain leading to her last surgery, which was performed at [facility].  She has not had a colonoscopy but is in the process of scheduling one. 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 She wears loose clothing to avoid pressure on her abdomen.   Interval history:   Due to the patient's small cancer and the possibility that she may have been able to have an endomucosal resection of a polyp and be able to avoid a partial gastrectomy, we sent the patient for an endoscopic ultrasound. Unfortunately, the polyp/mass did extend into the submucosa of the stomach and was not amenable to Endomucosal resection. Thankfully, the polyp was able to be described a bit better  and was definitely in the distal greater curvature in the antrum. It did also seem to be telescoping through the pylorus and causing intussusception intermittently. It was felt that this may be contributing to some of her symptomatology. Dr. Wilhelmenia did tattoo the lesion to facilitate intraoperative location of the mass. The previously located tiny neuroendocrine tumor was not seen and the previous biopsy site was located.  The patient is here to discuss these findings and review the potential surgical plan.  Patient is accompanied by her 2 daughters.  Review of Systems: A complete review of systems was obtained from the patient. I have reviewed this information and discussed as appropriate with the patient. See HPI as well for other ROS.  Review of Systems  All other systems reviewed and are negative.   Medical History: Past Medical History:  Diagnosis Date  Diabetes (CMS/HHS-HCC)  History of cancer  Hypertension  Hypoaldosteronism (CMS/HHS-HCC)   Patient Active Problem List  Diagnosis  HTN (hypertension)  Type 2 diabetes mellitus (CMS/HHS-HCC)  History of small bowel obstruction  Hypoaldosteronism (CMS/HHS-HCC)  Fibroid, uterine  Adenomatous polyp of stomach  Cancer of antrum of stomach (CMS/HHS-HCC)   Past Surgical History:  Procedure Laterality Date  EXPLORATORY LAPAROTOMY 06/03/2019  w/ lysis of adhesions - Dr. Curvin  DILATATION & CURETTAGE/HYSTEROSCOPY WITH MYOSURE (Vagina ) INTRAUTERINE DEVICE (IUD) INSERTION UNDER ULTRASOUND GUIDANCE 02/08/2020  Dr. Gloriann  SVT Ablation 10/03/2020  Dr. JUDITHANN [place]    Allergies  Allergen Reactions  Lisinopril Other (See Comments) and Cough  Other reaction(s): Other (See Comments)  Current Outpatient Medications on File Prior to Visit  Medication Sig Dispense Refill  amLODIPine  (NORVASC ) 10 MG tablet Take 10 mg by mouth once daily  atorvastatin  (LIPITOR) 40 MG tablet Take 40 mg by mouth once daily  INVOKANA  300 mg tablet TAKE 1  TABLET BY MOUTH ONCE DAILY BEFORE BREAKFAST  losartan  (COZAAR ) 100 MG tablet Take 100 mg by mouth once daily  metoprolol  tartrate (LOPRESSOR ) 100 MG tablet Take 100 mg by mouth 2 (two) times daily  ondansetron  (ZOFRAN ) 4 MG tablet  polyethylene glycol 3350  (MIRALAX  ORAL) Take by mouth  spironolactone  (ALDACTONE ) 100 MG tablet Take 100 mg by mouth once daily  TRULICITY  3 mg/0.5 mL subcutaneous pen injector INJECT 3 MG SUBCUTANEOUSLY ONCE A WEEK   No current facility-administered medications on file prior to visit.   Family History  Problem Relation Age of Onset  Stroke Mother  Breast cancer Mother  Coronary Artery Disease (Blocked arteries around heart) Father  High blood pressure (Hypertension) Sister    Social History   Tobacco Use  Smoking Status Never  Smokeless Tobacco Never    Social History   Socioeconomic History  Marital status: Single  Tobacco Use  Smoking status: Never  Smokeless tobacco: Never  Vaping Use  Vaping status: Never Used  Substance and Sexual Activity  Alcohol  use: Never  Drug use: Never   Social Drivers of Health   Food Insecurity: Food Insecurity Present (11/30/2023)  [facility] from [name]  Hunger Vital Sign  Within the past 12 months, you worried that your food would run out before you got the money to buy more.: Sometimes true  Within the past 12 months, the food you bought just didn't last and you didn't have money to get more.: Sometimes true  Transportation Needs: No Transportation Needs (11/30/2023)  Received from [facility]  In the past 12 months, has lack of transportation kept you from medical appointments or from getting medications?: No  In the past 12 months, has lack of transportation kept you from meetings, work, or from getting things needed for daily living?: No  Housing Stability: Unknown (11/28/2023)  Housing Stability Vital Sign  Homeless in the Last Year: No   Objective:   There were no  vitals filed for this visit.  There is no height or weight on file to calculate BMI.  Head: Normocephalic and atraumatic.  Eyes: Conjunctivae are normal. Pupils are equal, round, and reactive to light. No scleral icterus.  Resp: No respiratory distress, normal effort. Neurological: Alert and oriented to person, place, and time. Coordination normal.  Skin: Skin is warm and dry. No rash noted. No diaphoretic. No erythema. No pallor.  Psychiatric: Normal mood and affect. Normal behavior. Judgment and thought content normal.   Labs, Imaging and Diagnostic Testing:  CMET, CBC 7/3 normal  Assessment and Plan:   Diagnoses and all orders for this visit:  Cancer of antrum of stomach (CMS/HHS-HCC)  Fortunately, it does appear that the amount of adenocarcinoma and gastric polyp may be larger than what was originally apparent from the EGD. Thankfully, the cancer appears to be isolated to the antrum so the amount of stomach that Whitaker need to be resected and does not appear to be more than the distal stomach. I discussed distal gastrectomy with the patient. I reviewed the different types of reconstruction and how usually we cannot reach the duodenum to reconstruct with a Billroth I configuration and that we typically have a Billroth II configuration. I do  not think that she Whitaker need a feeding tube for distal gastrectomy.  I reviewed risk of surgery including bleeding, infection, damage to adjacent structures, heart or lung complications, blood clot, death, potential leak from the stomach anastomosis, possible need for additional surgeries or procedures, possible delayed complications such as bleeding from the staple line or bile acid reflux, or other anticipated complications. I discussed that there is a risk of recurrent cancer. I discussed that sometimes cancer can be more extensive than what we anticipated and sometimes more extensive surgery is required and that sometimes additional treatment such as  chemotherapy is recommended following surgery.  I discussed time in the hospital as well as recovery anticipation and time off work. The patient understands and wished to proceed. We we will do this at the first mutually available opportunity.  Assessment & Plan Malignant neoplasm of distal stomach (pyloric antrum) Adenocarcinoma in situ in the pyloric antrum within a pedunculated polyp, extending into the stomach lining. Distal gastrectomy is indicated due to risk of positive margins with simple polypectomy. Early-stage cancer with favorable prognosis for surgical intervention. - Perform distal gastrectomy to remove the affected portion of the stomach. - Conduct frozen section margins during surgery to ensure clear margins. - Monitor for potential complications such as bleeding, infection, and delayed gastric emptying post-surgery. - Manage post-operative pain with gabapentin , muscle relaxants, Tylenol , and narcotics as needed. - Consult nutrition to ensure adequate caloric intake post-surgery. - Advise on dietary progression post-surgery, starting with full liquids and soft solids. - Monitor for adequate caloric intake before discharge, aiming for at least 1400 calories per day. - Schedule follow-up with GI for ongoing surveillance and management.  Abdominal pain related to distal gastric malignancy Abdominal pain due to obstruction from the polyp in the distal stomach. Surgical intervention expected to relieve obstruction and pain. - Address abdominal pain through surgical intervention (distal gastrectomy) to remove the obstructing polyp. - Manage post-operative pain with appropriate analgesics, including narcotics if necessary.  No follow-ups on file.  JINA CLAIR NEPHEW, MD

## 2024-01-10 ENCOUNTER — Other Ambulatory Visit (HOSPITAL_COMMUNITY)

## 2024-01-10 NOTE — Progress Notes (Signed)
 Pt made aware of surgery time change for 01/11/24 1100-1417, arrival 0900, stop drinking clear liquids at 0800, stop eating solid food at midnight, and to follow all other previous instructions given.

## 2024-01-10 NOTE — Progress Notes (Signed)
 Anesthesia Chart Review:  53 year old female follows with cardiology for history of HLD, HTN, primary hyperaldosteronism, SVT s/p ablation 2022. Echo 12/22/2023 showed LVEF 65 to 70%, mild aortic valve insufficiency, very mild dilation of the aortic root.  EKG 12/21/2023 showed normal sinus rhythm. She recently reestablished care with Dr. Verlin on 12/23/2023 for routine follow-up as well as preoperative evaluation.  Per note, Pre-operative cardiovascular examination: She has a normal cardiac exam, normal EKG and normal LV function by echo yesterday. She is very active. No concerning cardiac symptoms. She can proceed with her planned surgical procedure.  Other pertinent history includes GERD on PPI, non-insulin -dependent DM2, Recent diagnosis of gastric adenocarcinoma.  Last dose of GLP-1 agonist >2 weeks ago.  Preop labs reviewed, mild hypokalemia potassium 3.2, otherwise unremarkable.  DM2 well-controlled with A1c 5.8.  EKG 12/21/2023: Sinus rhythm.  Rate 93.  TTE 12/22/2023:  1. Left ventricular ejection fraction, by estimation, is 65 to 70%. The  left ventricle has normal function. The left ventricle has no regional  wall motion abnormalities. There is moderate asymmetric left ventricular  hypertrophy of the septal segment.  Left ventricular diastolic parameters are indeterminate.   2. Right ventricular systolic function is normal. The right ventricular  size is normal.   3. The mitral valve is normal in structure. No evidence of mitral valve  regurgitation. No evidence of mitral stenosis.   4. The aortic valve is tricuspid. Aortic valve regurgitation is mild to  moderate. No aortic stenosis is present.   5. Aortic dilatation noted. There is mild dilatation of the aortic root,  measuring 39 mm. There is mild dilatation of the ascending aorta,  measuring 39 mm. There is mild dilatation of the aortic arch, measuring 37  mm.   6. The inferior vena cava is normal in size with greater than 50%   respiratory variability, suggesting right atrial pressure of 3 mmHg.   Comparison(s): EF 60%, mild LVH, mild AI.     Lynwood Geofm [facility] Stay [facility]/Anesthesiology Phone [phone] 01/10/2024 10:11 AM

## 2024-01-10 NOTE — Anesthesia Preprocedure Evaluation (Signed)
 Anesthesia Evaluation  Patient identified by MRN, date of birth, ID band Patient awake    Reviewed: Allergy  & Precautions, NPO status , Patient's Chart, lab work & pertinent test results  Airway Mallampati: III  TM Distance: >3 FB Neck ROM: Full    Dental   Pulmonary neg pulmonary ROS   Pulmonary exam normal        Cardiovascular hypertension, Pt. on medications Normal cardiovascular exam+ dysrhythmias (s/p ablation) Supra Ventricular Tachycardia      Neuro/Psych negative neurological ROS     GI/Hepatic Neg liver ROS, PUD,GERD  ,,Gastric cancer   Endo/Other  diabetes, Type 2    Renal/GU negative Renal ROS     Musculoskeletal   Abdominal   Peds  Hematology negative hematology ROS (+)   Anesthesia Other Findings   Reproductive/Obstetrics                              Anesthesia Physical Anesthesia Plan  ASA: 2  Anesthesia Plan: General   Post-op Pain Management: Tylenol  PO (pre-op)*   Induction: Intravenous  PONV Risk Score and Plan: 4 or greater and Ondansetron , Dexamethasone , Midazolam , Treatment may vary due to age or medical condition and Propofol  infusion  Airway Management Planned: Oral ETT  Additional Equipment: None  Intra-op Plan:   Post-operative Plan: Extubation in OR  Informed Consent: I have reviewed the patients History and Physical, chart, labs and discussed the procedure including the risks, benefits and alternatives for the proposed anesthesia with the patient or authorized representative who has indicated his/her understanding and acceptance.     Dental advisory given  Plan Discussed with:   Anesthesia Plan Comments: (PAT note by Lynwood Hope, PA-C:  53 year old female follows with cardiology for history of HLD, HTN, primary hyperaldosteronism, SVT s/p ablation 2022. Echo 12/22/2023 showed LVEF 65 to 70%, mild aortic valve insufficiency, very mild dilation of  the aortic root.  EKG 12/21/2023 showed normal sinus rhythm. She recently reestablished care with Dr. Verlin on 12/23/2023 for routine follow-up as well as preoperative evaluation.  Per note, Pre-operative cardiovascular examination: She has a normal cardiac exam, normal EKG and normal LV function by echo yesterday. She is very active. No concerning cardiac symptoms. She can proceed with her planned surgical procedure.  Other pertinent history includes GERD on PPI, non-insulin -dependent DM2, Recent diagnosis of gastric adenocarcinoma.  Last dose of GLP-1 agonist >2 weeks ago.  Preop labs reviewed, mild hypokalemia potassium 3.2, otherwise unremarkable.  DM2 well-controlled with A1c 5.8.  EKG 12/21/2023: Sinus rhythm.  Rate 93.  TTE 12/22/2023: 1. Left ventricular ejection fraction, by estimation, is 65 to 70%. The  left ventricle has normal function. The left ventricle has no regional  wall motion abnormalities. There is moderate asymmetric left ventricular  hypertrophy of the septal segment.  Left ventricular diastolic parameters are indeterminate.  2. Right ventricular systolic function is normal. The right ventricular  size is normal.  3. The mitral valve is normal in structure. No evidence of mitral valve  regurgitation. No evidence of mitral stenosis.  4. The aortic valve is tricuspid. Aortic valve regurgitation is mild to  moderate. No aortic stenosis is present.  5. Aortic dilatation noted. There is mild dilatation of the aortic root,  measuring 39 mm. There is mild dilatation of the ascending aorta,  measuring 39 mm. There is mild dilatation of the aortic arch, measuring 37  mm.  6. The inferior vena cava is normal in  size with greater than 50%  respiratory variability, suggesting right atrial pressure of 3 mmHg.   Comparison(s): EF 60%, mild LVH, mild AI.    )         Anesthesia Quick Evaluation

## 2024-01-11 ENCOUNTER — Inpatient Hospital Stay (HOSPITAL_COMMUNITY): Admitting: Anesthesiology

## 2024-01-11 ENCOUNTER — Inpatient Hospital Stay (HOSPITAL_COMMUNITY)
Admission: RE | Admit: 2024-01-11 | Discharge: 2024-01-19 | DRG: 327 | Disposition: A | Discharge status: Home Health | Attending: General Surgery | Admitting: General Surgery

## 2024-01-11 ENCOUNTER — Other Ambulatory Visit: Payer: Self-pay

## 2024-01-11 ENCOUNTER — Encounter (HOSPITAL_COMMUNITY)
Admission: RE | Disposition: A | Discharge status: Home / Self Care | Payer: Self-pay | Source: Home / Self Care | Attending: General Surgery

## 2024-01-11 ENCOUNTER — Inpatient Hospital Stay (HOSPITAL_COMMUNITY): Admitting: Physician Assistant

## 2024-01-11 ENCOUNTER — Encounter (HOSPITAL_COMMUNITY): Payer: Self-pay | Admitting: General Surgery

## 2024-01-11 DIAGNOSIS — Z79899 Other long term (current) drug therapy: Secondary | ICD-10-CM | POA: Diagnosis not present

## 2024-01-11 DIAGNOSIS — E2740 Unspecified adrenocortical insufficiency: Secondary | ICD-10-CM | POA: Diagnosis present

## 2024-01-11 DIAGNOSIS — K660 Peritoneal adhesions (postprocedural) (postinfection): Secondary | ICD-10-CM | POA: Diagnosis present

## 2024-01-11 DIAGNOSIS — C163 Malignant neoplasm of pyloric antrum: Secondary | ICD-10-CM

## 2024-01-11 DIAGNOSIS — E785 Hyperlipidemia, unspecified: Secondary | ICD-10-CM | POA: Diagnosis present

## 2024-01-11 DIAGNOSIS — Z7985 Long-term (current) use of injectable non-insulin antidiabetic drugs: Secondary | ICD-10-CM | POA: Diagnosis not present

## 2024-01-11 DIAGNOSIS — E119 Type 2 diabetes mellitus without complications: Secondary | ICD-10-CM | POA: Diagnosis present

## 2024-01-11 DIAGNOSIS — I10 Essential (primary) hypertension: Secondary | ICD-10-CM | POA: Diagnosis present

## 2024-01-11 DIAGNOSIS — K317 Polyp of stomach and duodenum: Secondary | ICD-10-CM | POA: Diagnosis present

## 2024-01-11 DIAGNOSIS — Z8249 Family history of ischemic heart disease and other diseases of the circulatory system: Secondary | ICD-10-CM | POA: Diagnosis not present

## 2024-01-11 DIAGNOSIS — C169 Malignant neoplasm of stomach, unspecified: Principal | ICD-10-CM

## 2024-01-11 DIAGNOSIS — D259 Leiomyoma of uterus, unspecified: Secondary | ICD-10-CM | POA: Diagnosis present

## 2024-01-11 DIAGNOSIS — E876 Hypokalemia: Secondary | ICD-10-CM | POA: Diagnosis present

## 2024-01-11 DIAGNOSIS — K561 Intussusception: Secondary | ICD-10-CM | POA: Diagnosis present

## 2024-01-11 DIAGNOSIS — K219 Gastro-esophageal reflux disease without esophagitis: Secondary | ICD-10-CM | POA: Diagnosis present

## 2024-01-11 DIAGNOSIS — Z5941 Food insecurity: Secondary | ICD-10-CM | POA: Diagnosis not present

## 2024-01-11 HISTORY — PX: PARTIAL GASTRECTOMY: SHX6003

## 2024-01-11 HISTORY — PX: LAPAROSCOPY: SHX197

## 2024-01-11 LAB — CBC
HCT: 38 % (ref 36.0–46.0)
Hemoglobin: 12.6 g/dL (ref 12.0–15.0)
MCH: 29 pg (ref 26.0–34.0)
MCHC: 33.2 g/dL (ref 30.0–36.0)
MCV: 87.6 fL (ref 80.0–100.0)
Platelets: 357 K/uL (ref 150–400)
RBC: 4.34 MIL/uL (ref 3.87–5.11)
RDW: 14.5 % (ref 11.5–15.5)
WBC: 13.3 K/uL — ABNORMAL HIGH (ref 4.0–10.5)
nRBC: 0 % (ref 0.0–0.2)

## 2024-01-11 LAB — CREATININE, SERUM
Creatinine, Ser: 0.8 mg/dL (ref 0.44–1.00)
GFR, Estimated: >60 mL/min (ref >60)

## 2024-01-11 LAB — GLUCOSE, CAPILLARY
Glucose-Capillary: 124 mg/dL — ABNORMAL HIGH (ref 70–99)
Glucose-Capillary: 141 mg/dL — ABNORMAL HIGH (ref 70–99)
Glucose-Capillary: 187 mg/dL — ABNORMAL HIGH (ref 70–99)

## 2024-01-11 SURGERY — GASTRECTOMY, PARTIAL
Anesthesia: General

## 2024-01-11 MED ORDER — OXYCODONE HCL 5 MG/5ML PO SOLN: 5.0000 mg | Freq: Once | ORAL | Status: DC | PRN

## 2024-01-11 MED ORDER — LIDOCAINE 2% (20 MG/ML) 5 ML SYRINGE
INTRAMUSCULAR | Status: DC | PRN
  Administered 2024-01-11: 60 mg via INTRAVENOUS

## 2024-01-11 MED ORDER — CHLORHEXIDINE GLUCONATE 0.12 % MT SOLN
15.0000 mL | Freq: Once | OROMUCOSAL | Status: AC
  Administered 2024-01-11: 15 mL via OROMUCOSAL
  Filled 2024-01-11: qty 15

## 2024-01-11 MED ORDER — HYDROMORPHONE HCL 1 MG/ML IJ SOLN
INTRAMUSCULAR | Status: DC | PRN
  Administered 2024-01-11: .5 mg via INTRAVENOUS

## 2024-01-11 MED ORDER — FENTANYL CITRATE (PF) 250 MCG/5ML IJ SOLN
INTRAMUSCULAR | Status: DC | PRN
  Administered 2024-01-11: 50 ug via INTRAVENOUS
  Administered 2024-01-11: 100 ug via INTRAVENOUS
  Administered 2024-01-11: 50 ug via INTRAVENOUS

## 2024-01-11 MED ORDER — CHLORHEXIDINE GLUCONATE CLOTH 2 % EX PADS: 6.0000 | MEDICATED_PAD | Freq: Once | CUTANEOUS | Status: DC

## 2024-01-11 MED ORDER — EPHEDRINE SULFATE-NACL 50-0.9 MG/10ML-% IV SOSY
PREFILLED_SYRINGE | INTRAVENOUS | Status: DC | PRN
  Administered 2024-01-11: 10 mg via INTRAVENOUS

## 2024-01-11 MED ORDER — PROPOFOL 10 MG/ML IV BOLUS
INTRAVENOUS | Status: AC
  Filled 2024-01-11: qty 20

## 2024-01-11 MED ORDER — INSULIN ASPART 100 UNIT/ML IJ SOLN
0.0000 [IU] | Freq: Three times a day (TID) | INTRAMUSCULAR | Status: DC
  Administered 2024-01-11 – 2024-01-16 (×5): 2 [IU] via SUBCUTANEOUS

## 2024-01-11 MED ORDER — SUGAMMADEX SODIUM 200 MG/2ML IV SOLN
INTRAVENOUS | Status: DC | PRN
  Administered 2024-01-11: 200 mg via INTRAVENOUS

## 2024-01-11 MED ORDER — ONDANSETRON HCL 4 MG/2ML IJ SOLN
INTRAMUSCULAR | Status: AC
  Filled 2024-01-11: qty 2

## 2024-01-11 MED ORDER — DIPHENHYDRAMINE HCL 12.5 MG/5ML PO ELIX: 12.5000 mg | ORAL_SOLUTION | Freq: Four times a day (QID) | ORAL | Status: DC | PRN

## 2024-01-11 MED ORDER — METHOCARBAMOL 1000 MG/10ML IJ SOLN: 500.0000 mg | Freq: Three times a day (TID) | INTRAMUSCULAR | Status: DC | PRN

## 2024-01-11 MED ORDER — SCOPOLAMINE 1 MG/3DAYS TD PT72: 1.0000 | MEDICATED_PATCH | TRANSDERMAL | Status: DC

## 2024-01-11 MED ORDER — HYDROMORPHONE HCL 1 MG/ML IJ SOLN
INTRAMUSCULAR | Status: AC
  Filled 2024-01-11: qty 0.5

## 2024-01-11 MED ORDER — NALOXONE HCL 0.4 MG/ML IJ SOLN: 0.4000 mg | INTRAMUSCULAR | Status: DC | PRN

## 2024-01-11 MED ORDER — LACTATED RINGERS IV SOLN: INTRAVENOUS | Status: DC | PRN

## 2024-01-11 MED ORDER — PHENYLEPHRINE 80 MCG/ML (10ML) SYRINGE FOR IV PUSH (FOR BLOOD PRESSURE SUPPORT)
PREFILLED_SYRINGE | INTRAVENOUS | Status: DC | PRN
  Administered 2024-01-11: 160 ug via INTRAVENOUS

## 2024-01-11 MED ORDER — ONDANSETRON HCL 4 MG/2ML IJ SOLN: 4.0000 mg | Freq: Four times a day (QID) | INTRAMUSCULAR | Status: DC | PRN

## 2024-01-11 MED ORDER — PROCHLORPERAZINE EDISYLATE 10 MG/2ML IJ SOLN
5.0000 mg | Freq: Four times a day (QID) | INTRAMUSCULAR | Status: DC | PRN
  Administered 2024-01-18: 10 mg via INTRAVENOUS
  Filled 2024-01-11: qty 2

## 2024-01-11 MED ORDER — MIDAZOLAM HCL 2 MG/2ML IJ SOLN
INTRAMUSCULAR | Status: AC
  Filled 2024-01-11: qty 2

## 2024-01-11 MED ORDER — HYDRALAZINE HCL 20 MG/ML IJ SOLN: 10.0000 mg | INTRAMUSCULAR | Status: DC | PRN

## 2024-01-11 MED ORDER — CEFAZOLIN SODIUM-DEXTROSE 2-4 GM/100ML-% IV SOLN
2.0000 g | INTRAVENOUS | Status: AC
  Administered 2024-01-11: 2 g via INTRAVENOUS
  Filled 2024-01-11: qty 100

## 2024-01-11 MED ORDER — ROCURONIUM BROMIDE 10 MG/ML (PF) SYRINGE
PREFILLED_SYRINGE | INTRAVENOUS | Status: DC | PRN
  Administered 2024-01-11: 20 mg via INTRAVENOUS
  Administered 2024-01-11: 100 mg via INTRAVENOUS
  Administered 2024-01-11: 20 mg via INTRAVENOUS

## 2024-01-11 MED ORDER — ONDANSETRON 4 MG PO TBDP
4.0000 mg | ORAL_TABLET | Freq: Four times a day (QID) | ORAL | Status: DC | PRN
Start: 2024-01-11 — End: 2024-01-19
  Administered 2024-01-18 – 2024-01-19 (×2): 4 mg via ORAL
  Filled 2024-01-11 (×3): qty 1

## 2024-01-11 MED ORDER — ENOXAPARIN SODIUM 40 MG/0.4ML IJ SOSY
40.0000 mg | PREFILLED_SYRINGE | INTRAMUSCULAR | Status: DC
  Administered 2024-01-12 – 2024-01-19 (×8): 40 mg via SUBCUTANEOUS
  Filled 2024-01-11 (×8): qty 0.4

## 2024-01-11 MED ORDER — OXYCODONE HCL 5 MG PO TABS: 5.0000 mg | ORAL_TABLET | Freq: Once | ORAL | Status: DC | PRN

## 2024-01-11 MED ORDER — MORPHINE SULFATE 1 MG/ML IV SOLN PCA
INTRAVENOUS | Status: DC
  Administered 2024-01-11: 1 mg via INTRAVENOUS
  Administered 2024-01-12: 5.99 mg via INTRAVENOUS
  Administered 2024-01-12: 21.5 mg via INTRAVENOUS
  Administered 2024-01-12: 1 mg via INTRAVENOUS
  Filled 2024-01-11 (×2): qty 30

## 2024-01-11 MED ORDER — HYDROMORPHONE HCL 1 MG/ML IJ SOLN
2.0000 mg | Freq: Once | INTRAMUSCULAR | Status: AC
  Administered 2024-01-11: 2 mg via INTRAVENOUS
  Filled 2024-01-11: qty 2

## 2024-01-11 MED ORDER — INSULIN ASPART 100 UNIT/ML IJ SOLN: 0.0000 [IU] | INTRAMUSCULAR | Status: DC | PRN

## 2024-01-11 MED ORDER — ACETAMINOPHEN 500 MG PO TABS
1000.0000 mg | ORAL_TABLET | ORAL | Status: AC
  Administered 2024-01-11: 1000 mg via ORAL
  Filled 2024-01-11: qty 2

## 2024-01-11 MED ORDER — ONDANSETRON HCL 4 MG/2ML IJ SOLN
INTRAMUSCULAR | Status: DC | PRN
  Administered 2024-01-11: 4 mg via INTRAVENOUS

## 2024-01-11 MED ORDER — DIPHENHYDRAMINE HCL 50 MG/ML IJ SOLN: 12.5000 mg | Freq: Four times a day (QID) | INTRAMUSCULAR | Status: DC | PRN

## 2024-01-11 MED ORDER — ALBUMIN HUMAN 5 % IV SOLN: INTRAVENOUS | Status: DC | PRN

## 2024-01-11 MED ORDER — STERILE WATER FOR IRRIGATION IR SOLN
Status: DC | PRN
  Administered 2024-01-11 (×2): 1000 mL

## 2024-01-11 MED ORDER — KCL IN DEXTROSE-NACL 20-5-0.45 MEQ/L-%-% IV SOLN
INTRAVENOUS | Status: AC
  Administered 2024-01-12: 1000 mL via INTRAVENOUS
  Filled 2024-01-11 (×3): qty 1000

## 2024-01-11 MED ORDER — MIDAZOLAM HCL 2 MG/2ML IJ SOLN
INTRAMUSCULAR | Status: DC | PRN
  Administered 2024-01-11: 2 mg via INTRAVENOUS

## 2024-01-11 MED ORDER — LACTATED RINGERS IV SOLN: INTRAVENOUS | Status: DC

## 2024-01-11 MED ORDER — PROCHLORPERAZINE MALEATE 10 MG PO TABS: 10.0000 mg | ORAL_TABLET | Freq: Four times a day (QID) | ORAL | Status: DC | PRN

## 2024-01-11 MED ORDER — AMISULPRIDE (ANTIEMETIC) 5 MG/2ML IV SOLN: 10.0000 mg | Freq: Once | INTRAVENOUS | Status: DC | PRN

## 2024-01-11 MED ORDER — PROPOFOL 10 MG/ML IV BOLUS
INTRAVENOUS | Status: DC | PRN
  Administered 2024-01-11: 200 mg via INTRAVENOUS

## 2024-01-11 MED ORDER — LIDOCAINE HCL 1 % IJ SOLN
INTRAMUSCULAR | Status: DC | PRN
  Administered 2024-01-11: 5 mL via INTRAMUSCULAR

## 2024-01-11 MED ORDER — ORAL CARE MOUTH RINSE: 15.0000 mL | Freq: Once | OROMUCOSAL | Status: AC

## 2024-01-11 MED ORDER — PHENYLEPHRINE HCL-NACL 20-0.9 MG/250ML-% IV SOLN
INTRAVENOUS | Status: DC | PRN
  Administered 2024-01-11: 35 ug/min via INTRAVENOUS

## 2024-01-11 MED ORDER — BUPIVACAINE-EPINEPHRINE (PF) 0.25% -1:200000 IJ SOLN
INTRAMUSCULAR | Status: AC
  Filled 2024-01-11: qty 30

## 2024-01-11 MED ORDER — METOPROLOL TARTRATE 5 MG/5ML IV SOLN: 5.0000 mg | Freq: Four times a day (QID) | INTRAVENOUS | Status: DC | PRN

## 2024-01-11 MED ORDER — CEFAZOLIN SODIUM-DEXTROSE 2-4 GM/100ML-% IV SOLN
2.0000 g | Freq: Three times a day (TID) | INTRAVENOUS | Status: AC
  Administered 2024-01-11: 2 g via INTRAVENOUS
  Filled 2024-01-11: qty 100

## 2024-01-11 MED ORDER — FENTANYL CITRATE (PF) 100 MCG/2ML IJ SOLN
25.0000 ug | INTRAMUSCULAR | Status: DC | PRN
  Administered 2024-01-11: 50 ug via INTRAVENOUS
  Administered 2024-01-11: 25 ug via INTRAVENOUS

## 2024-01-11 MED ORDER — 0.9 % SODIUM CHLORIDE (POUR BTL) OPTIME
TOPICAL | Status: DC | PRN
Start: 2024-01-11 — End: 2024-01-11
  Administered 2024-01-11 (×3): 1000 mL

## 2024-01-11 MED ORDER — ACETAMINOPHEN 10 MG/ML IV SOLN
1000.0000 mg | Freq: Four times a day (QID) | INTRAVENOUS | Status: AC
  Administered 2024-01-11 – 2024-01-13 (×6): 1000 mg via INTRAVENOUS
  Filled 2024-01-11 (×6): qty 100

## 2024-01-11 MED ORDER — FENTANYL CITRATE (PF) 100 MCG/2ML IJ SOLN
INTRAMUSCULAR | Status: AC
  Filled 2024-01-11: qty 2

## 2024-01-11 MED ORDER — FENTANYL CITRATE (PF) 250 MCG/5ML IJ SOLN
INTRAMUSCULAR | Status: AC
  Filled 2024-01-11: qty 5

## 2024-01-11 MED ORDER — SODIUM CHLORIDE 0.9% FLUSH: 9.0000 mL | INTRAVENOUS | Status: DC | PRN

## 2024-01-11 SURGICAL SUPPLY — 54 items
BAG COUNTER SPONGE SURGICOUNT (BAG) ×2 IMPLANT
BLADE CLIPPER SURG (BLADE) IMPLANT
CANISTER SUCTION 3000ML PPV (SUCTIONS) ×2 IMPLANT
CHLORAPREP W/TINT 26 (MISCELLANEOUS) ×2 IMPLANT
CLIP TI LARGE 6 (CLIP) IMPLANT
CLIP TI MEDIUM 24 (CLIP) IMPLANT
COVER SURGICAL LIGHT HANDLE (MISCELLANEOUS) ×2 IMPLANT
DERMABOND ADVANCED .7 DNX12 (GAUZE/BANDAGES/DRESSINGS) ×2 IMPLANT
DRAPE LAPAROSCOPIC ABDOMINAL (DRAPES) ×2 IMPLANT
DRAPE WARM FLUID 44X44 (DRAPES) ×2 IMPLANT
DRSG OPSITE POSTOP 4X10 (GAUZE/BANDAGES/DRESSINGS) IMPLANT
DRSG OPSITE POSTOP 4X12 (GAUZE/BANDAGES/DRESSINGS) IMPLANT
ELECTRODE BLDE 4.0 EZ CLN MEGD (MISCELLANEOUS) IMPLANT
ELECTRODE REM PT RTRN 9FT ADLT (ELECTROSURGICAL) ×2 IMPLANT
GLOVE BIO SURGEON STRL SZ 6 (GLOVE) ×2 IMPLANT
GLOVE INDICATOR 6.5 STRL GRN (GLOVE) ×2 IMPLANT
GOWN STRL REUS W/ TWL LRG LVL3 (GOWN DISPOSABLE) ×4 IMPLANT
GOWN STRL REUS W/ TWL XL LVL3 (GOWN DISPOSABLE) ×4 IMPLANT
HANDLE SUCTION POOLE (INSTRUMENTS) IMPLANT
IRRIGATION SUCT STRKRFLW 2 WTP (MISCELLANEOUS) IMPLANT
KIT BASIN OR (CUSTOM PROCEDURE TRAY) ×2 IMPLANT
KIT TURNOVER KIT B (KITS) ×2 IMPLANT
LHOOK LAP DISP 36CM (ELECTROSURGICAL) ×2 IMPLANT
MANIFOLD NEPTUNE II (INSTRUMENTS) IMPLANT
NS IRRIG 1000ML POUR BTL (IV SOLUTION) ×2 IMPLANT
PAD ARMBOARD POSITIONER FOAM (MISCELLANEOUS) ×4 IMPLANT
PENCIL BUTTON HOLSTER BLD 10FT (ELECTRODE) ×2 IMPLANT
PENCIL SMOKE EVACUATOR (MISCELLANEOUS) IMPLANT
RELOAD STAPLE 75 3.8 BLU REG (ENDOMECHANICALS) IMPLANT
SCISSORS LAP 5X35 DISP (ENDOMECHANICALS) IMPLANT
SET TUBE SMOKE EVAC HIGH FLOW (TUBING) ×2 IMPLANT
SHEARS FOC LG CVD HARMONIC 17C (MISCELLANEOUS) IMPLANT
SLEEVE Z-THREAD 5X100MM (TROCAR) ×2 IMPLANT
SPIKE FLUID TRANSFER (MISCELLANEOUS) ×4 IMPLANT
SPONGE T-LAP 18X18 ~~LOC~~+RFID (SPONGE) IMPLANT
STAPLER PROXIMATE 75MM BLUE (STAPLE) IMPLANT
STAPLER RELOADABLE 30 BLU REG (STAPLE) IMPLANT
STAPLER SKIN PROX 35W (STAPLE) IMPLANT
SUT MNCRL AB 4-0 PS2 18 (SUTURE) ×2 IMPLANT
SUT PDS AB 1 TP1 96 (SUTURE) IMPLANT
SUT PDS AB 3-0 SH 27 (SUTURE) IMPLANT
SUT SILK 2 0 SH CR/8 (SUTURE) IMPLANT
SUT SILK 3 0 SH CR/8 (SUTURE) IMPLANT
SUT SILK 3 0 TIES 10X30 (SUTURE) IMPLANT
SUT VIC AB 3-0 SH 27X BRD (SUTURE) IMPLANT
TOWEL GREEN STERILE (TOWEL DISPOSABLE) ×2 IMPLANT
TOWEL GREEN STERILE FF (TOWEL DISPOSABLE) ×2 IMPLANT
TRAY LAPAROSCOPIC MC (CUSTOM PROCEDURE TRAY) ×2 IMPLANT
TROCAR 11X100 Z THREAD (TROCAR) IMPLANT
TROCAR BALLN 12MMX100 BLUNT (TROCAR) IMPLANT
TROCAR Z-THREAD OPTICAL 5X100M (TROCAR) ×2 IMPLANT
TUBE CONNECTING 20X1/4 (TUBING) IMPLANT
WARMER LAPAROSCOPE (MISCELLANEOUS) ×2 IMPLANT
YANKAUER SUCT BULB TIP NO VENT (SUCTIONS) IMPLANT

## 2024-01-11 NOTE — Op Note (Addendum)
 PRE-OPERATIVE DIAGNOSIS: Gastric adenocarcinoma of pyloric antrum  POST-OPERATIVE DIAGNOSIS:  Same  PROCEDURE:  Procedure(s): Diagnostic laparoscopy, open distal gastrectomy  SURGEON:  Surgeon(s): [name], MD  ASSISTANT:   A. Eva Barrier, MD  ANESTHESIA:   local and general  DRAINS: NGT tube  LOCAL MEDICATIONS USED:  BUPIVICAINE and LIDOCAINE    SPECIMEN:  Source of Specimen:  distal stomach,  left gastric lymph nodes  DISPOSITION OF SPECIMEN:  PATHOLOGY  COUNTS:  YES  DICTATION: None  PLAN OF CARE: Admit to inpatient   PATIENT DISPOSITION:  PACU - hemodynamically stable.  FINDINGS:  No evidence of carcinomatosis, polypoid mass in distal stomach, greater curve, several visible LN at left gastric takeoff and porta  EBL: 100 mL  PROCEDURE:   The patient was identified in the holding area and was taken to the OR where she was placed supine on the operating room table. General anesthesia was induced. Arms were tucked, and foley catheter was placed. Abdomen was prepped and draped in sterile fashion. Timeout was performed according to the surgical safety checklist. When all was correct, we continued.    The patient was rotated to the left and placed into reverse trendelenburg position.  A 5 mm incision was made at the costal margin and a 5 mm Optiview trocar was placed under direct visualization. Pneumoperitoneum was achieved to a pressure of 15 mm Hg. The abdomen was examined with the camera and no evidence of carcinomatosis was seen.  An upper midline incision was made in the abdomen and the abdomen was then manually explored.  The stomach was mobile, and the pelvis and undersurface of the liver was palpated.  Again, no carcinomatosis was identified.    The Bookwalter was used to assist with visualization.  The stomach was mobilized.  The omentum was taken off the colon and the lesser sac was entered.  The Harmonic was used to take down the gastroepiploics and the edge of  the lesser curve and greater curve were exposed.  Two blue loads of the GIA 75 mm stapler were used to divide the antrum.  Additional body of the stomach was resected with the antrum in order to get good margin on the tumor.  The harmonic was then used to dissect superiorly and inferiorly.  The duodenal bulb was skeletonized and was divided with a TA stapler.  The specimen was marked and sent to pathology.  The staple line of the duodenum and the stomach were oversewn with running 3-0 PDS suture.  The left gastric/common hepatic artery were skeletonized and sent with the specimen. Three additional LNs were found and sent as well as left gastric LNs.    The gastrojejunostomy was then performed.  This was antecolic and retrogastric.  The jejunum was secured to the posterior wall of the stomach with two 3-0 silk sutures.  The stomach and jejunum were opened, and the 75 mm stapler was used to create a stapled linear anastamosis. The staple line was evaluated for hemostasis and no bleeding was seen.  The NGT was advanced into the afferent limb of the jejunum, and the defect was closed with two 3-0 PDS sutures in connell fashion.  The NGT was secured.  Additional silk sutures were used as anti-tension sutures.    Given the patient's previous history of small bowel obstruction and intermittent abdominal pain since, we ran the small bowel. There were multiple interloop adhesions which were resulting in narrowing and twisting of the small bowel mesentery. These were taken down with  electrocautery and blunt dissection. Upon freeing this small bowel, there was a small rent in the mesentery that was closed with a running 3-0 PDS suture. The bowel was subsequently run again and found to be without issue.  The abdomen was then irrigated.  A sponge count was performed. he fascia was then closed with two #1 looped PDS sutures.  The skin was irrigated and closed with staples. The abdomen was then cleaned, dried, and dressed  with dry sterile dressings.    Needle, sponge, and instrument counts were correct x 2.  The patient was allowed to emerge from anesthesia and taken to the PACU in stable condition.      I was personally present and scrubbed throughout the procedure.  I performed the key and critical portions of this procedure.

## 2024-01-11 NOTE — Transfer of Care (Signed)
 Immediate Anesthesia Transfer of Care Note  Patient: Susan Whitaker  Procedure(s) Performed: GASTRECTOMY, PARTIAL LAPAROSCOPY, DIAGNOSTIC  Patient Location: PACU  Anesthesia Type:General  Level of Consciousness: drowsy and patient cooperative  Airway & Oxygen Therapy: Patient Spontanous Breathing  Post-op Assessment: Report given to RN and Post -op Vital signs reviewed and stable  Post vital signs: Reviewed and stable  Last Vitals:  Vitals Value Taken Time  BP    Temp 37 C   Pulse 85   Resp 14   SpO2 96%     Last Pain:  Vitals:   01/11/24 0951  TempSrc:   PainSc: 3       Patients Stated Pain Goal: 1 (01/11/24 0942)  Complications: No notable events documented.

## 2024-01-11 NOTE — Interval H&P Note (Signed)
 History and Physical Interval Note:  01/11/2024 10:49 AM  Susan Whitaker  has presented today for surgery, with the diagnosis of GASTRIC CANCER.  The various methods of treatment have been discussed with the patient and family. After consideration of risks, benefits and other options for treatment, the patient has consented to  Procedure(s) with comments: GASTRECTOMY, PARTIAL (N/A) - DISTAL GASTRECTOMY LAPAROSCOPY, DIAGNOSTIC (N/A) as a surgical intervention.  The patient's history has been reviewed, patient examined, no change in status, stable for surgery.  I have reviewed the patient's chart and labs.  Questions were answered to the patient's satisfaction.     [name]

## 2024-01-11 NOTE — Anesthesia Postprocedure Evaluation (Signed)
 Anesthesia Post Note  Patient: Susan Whitaker  Procedure(s) Performed: GASTRECTOMY, PARTIAL LAPAROSCOPY, DIAGNOSTIC     Patient location during evaluation: PACU Anesthesia Type: General Level of consciousness: oriented, patient cooperative and sedated Pain control: pain improving. Vital Signs Assessment: post-procedure vital signs reviewed and stable Respiratory status: spontaneous breathing, nonlabored ventilation, respiratory function stable and patient connected to nasal cannula oxygen Cardiovascular status: blood pressure returned to baseline and stable Postop Assessment: no apparent nausea or vomiting Anesthetic complications: no   No notable events documented.  Last Vitals:  Vitals:   01/11/24 1630 01/11/24 1706  BP: (!) 121/59 115/60  Pulse: 92 92  Resp: 14   Temp: 36.7 C 36.7 C  SpO2: 97% 96%                  Mahalie Kanner,E. Sausha Raymond

## 2024-01-11 NOTE — Plan of Care (Signed)
 Pt arrived in the unit with PCA pump and CPOx with repetitive saying help me. Family arrived. Repositioned to comfort. Assessed accordingly but unable to assess the back skin due to patient is unable to tolerate turning due to pain. Call bell within the reached. Bed alarm tuned on. Patient is still drowsy but easily arousable. Education has been rendered but still needs reinforcement due to drowsiness. Family at bedside. Problem: Education: Goal: Knowledge of General Education information will improve Description: Including pain rating scale, medication(s)/side effects and non-pharmacologic comfort measures Outcome: Progressing   Problem: Health Behavior/Discharge Planning: Goal: Ability to manage health-related needs will improve Outcome: Progressing   Problem: Clinical Measurements: Goal: Will remain free from infection Outcome: Progressing   Problem: Activity: Goal: Risk for activity intolerance will decrease Outcome: Progressing   Problem: Nutrition: Goal: Adequate nutrition will be maintained Outcome: Progressing

## 2024-01-11 NOTE — Anesthesia Procedure Notes (Signed)
 Procedure Name: Intubation Date/Time: 01/11/2024 11:18 AM  Performed by: Marva Lonni PARAS, CRNAPre-anesthesia Checklist: Patient identified, Emergency Drugs available, Suction available and Patient being monitored Patient Re-evaluated:Patient Re-evaluated prior to induction Oxygen Delivery Method: Circle System Utilized Preoxygenation: Pre-oxygenation with 100% oxygen Induction Type: IV induction Ventilation: Mask ventilation without difficulty Laryngoscope Size: Mac and 3 Grade View: Grade II Tube type: Oral Tube size: 7.0 mm Number of attempts: 2 Airway Equipment and Method: Stylet and Oral airway Placement Confirmation: ETT inserted through vocal cords under direct vision, positive ETCO2 and breath sounds checked- equal and bilateral Secured at: 21 cm Tube secured with: Tape Dental Injury: Teeth and Oropharynx as per pre-operative assessment

## 2024-01-12 ENCOUNTER — Encounter (HOSPITAL_COMMUNITY): Payer: Self-pay | Admitting: General Surgery

## 2024-01-12 LAB — PROTIME-INR
INR: 1.2 (ref 0.8–1.2)
Prothrombin Time: 15.4 s — ABNORMAL HIGH (ref 11.4–15.2)

## 2024-01-12 LAB — BASIC METABOLIC PANEL WITH GFR
Anion gap: 9 (ref 5–15)
BUN: 10 mg/dL (ref 6–20)
CO2: 26 mmol/L (ref 22–32)
Calcium: 8.4 mg/dL — ABNORMAL LOW (ref 8.9–10.3)
Chloride: 105 mmol/L (ref 98–111)
Creatinine, Ser: 0.72 mg/dL (ref 0.44–1.00)
GFR, Estimated: >60 mL/min (ref >60)
Glucose, Bld: 147 mg/dL — ABNORMAL HIGH (ref 70–99)
Potassium: 3.7 mmol/L (ref 3.5–5.1)
Sodium: 140 mmol/L (ref 135–145)

## 2024-01-12 LAB — GLUCOSE, CAPILLARY
Glucose-Capillary: 134 mg/dL — ABNORMAL HIGH (ref 70–99)
Glucose-Capillary: 125 mg/dL — ABNORMAL HIGH (ref 70–99)
Glucose-Capillary: 101 mg/dL — ABNORMAL HIGH (ref 70–99)
Glucose-Capillary: 109 mg/dL — ABNORMAL HIGH (ref 70–99)

## 2024-01-12 LAB — CBC
HCT: 35.8 % — ABNORMAL LOW (ref 36.0–46.0)
Hemoglobin: 12 g/dL (ref 12.0–15.0)
MCH: 29.5 pg (ref 26.0–34.0)
MCHC: 33.5 g/dL (ref 30.0–36.0)
MCV: 88 fL (ref 80.0–100.0)
Platelets: 338 K/uL (ref 150–400)
RBC: 4.07 MIL/uL (ref 3.87–5.11)
RDW: 14.7 % (ref 11.5–15.5)
WBC: 13 K/uL — ABNORMAL HIGH (ref 4.0–10.5)
nRBC: 0 % (ref 0.0–0.2)

## 2024-01-12 LAB — MAGNESIUM: Magnesium: 2 mg/dL (ref 1.7–2.4)

## 2024-01-12 LAB — PHOSPHORUS: Phosphorus: 3.4 mg/dL (ref 2.5–4.6)

## 2024-01-12 MED ORDER — ONDANSETRON HCL 4 MG/2ML IJ SOLN
4.0000 mg | Freq: Four times a day (QID) | INTRAMUSCULAR | Status: DC | PRN
  Administered 2024-01-12: 4 mg via INTRAVENOUS
  Filled 2024-01-12: qty 2

## 2024-01-12 MED ORDER — SODIUM CHLORIDE 0.9% FLUSH: 9.0000 mL | INTRAVENOUS | Status: DC | PRN

## 2024-01-12 MED ORDER — CHLORHEXIDINE GLUCONATE CLOTH 2 % EX PADS
6.0000 | MEDICATED_PAD | Freq: Every day | CUTANEOUS | Status: DC
  Administered 2024-01-12: 6 via TOPICAL

## 2024-01-12 MED ORDER — NALOXONE HCL 0.4 MG/ML IJ SOLN: 0.4000 mg | INTRAMUSCULAR | Status: DC | PRN

## 2024-01-12 MED ORDER — KCL IN DEXTROSE-NACL 20-5-0.45 MEQ/L-%-% IV SOLN
INTRAVENOUS | Status: AC
  Filled 2024-01-12 (×2): qty 1000

## 2024-01-12 MED ORDER — HYDROMORPHONE 1 MG/ML IV SOLN
INTRAVENOUS | Status: DC
  Administered 2024-01-12: 0.3 mg via INTRAVENOUS
  Administered 2024-01-12: 30 mg via INTRAVENOUS
  Administered 2024-01-12 – 2024-01-13 (×2): 1.5 mg via INTRAVENOUS
  Administered 2024-01-13: 2.1 mg via INTRAVENOUS
  Administered 2024-01-13: 1.5 mg via INTRAVENOUS
  Administered 2024-01-13: 0.6 mg via INTRAVENOUS
  Administered 2024-01-13: 0.9 mg via INTRAVENOUS
  Administered 2024-01-13: 0.6 mg via INTRAVENOUS
  Administered 2024-01-14: 0.9 mg via INTRAVENOUS
  Administered 2024-01-14 (×2): 1 mg via INTRAVENOUS
  Administered 2024-01-14: 1.5 mg via INTRAVENOUS
  Administered 2024-01-14: 2.1 mg via INTRAVENOUS
  Administered 2024-01-14 – 2024-01-15 (×2): 1 mg via INTRAVENOUS
  Administered 2024-01-15: 0.3 mg via INTRAVENOUS
  Administered 2024-01-15: 0.6 mg via INTRAVENOUS
  Filled 2024-01-12 (×2): qty 30

## 2024-01-12 MED ORDER — HYDROMORPHONE HCL 1 MG/ML IJ SOLN
1.0000 mg | Freq: Once | INTRAMUSCULAR | Status: AC
  Administered 2024-01-12: 1 mg via INTRAVENOUS
  Filled 2024-01-12: qty 1

## 2024-01-12 NOTE — Plan of Care (Signed)
  Problem: Education: Goal: Knowledge of General Education information will improve Description: Including pain rating scale, medication(s)/side effects and non-pharmacologic comfort measures Outcome: Progressing   Problem: Pain Managment: Goal: General experience of comfort will improve and/or be controlled Outcome: Progressing   Problem: Skin Integrity: Goal: Risk for impaired skin integrity will decrease Outcome: Progressing   Problem: Skin Integrity: Goal: Risk for impaired skin integrity will decrease Outcome: Progressing

## 2024-01-12 NOTE — Plan of Care (Signed)
   Problem: Education: Goal: Knowledge of General Education information will improve Description Including pain rating scale, medication(s)/side effects and non-pharmacologic comfort measures Outcome: Progressing

## 2024-01-12 NOTE — TOC CM/SW Note (Signed)
  Transition of Care (TOC) Screening Note   Patient Details  Name: Susan Whitaker Date of Birth: [birth year]   Transition of Care [facility]) CM/SW Contact:    Stephane Powell Jansky, RN Phone Number: 01/12/2024, 12:02 PM  Patient post op day 1 , Vomited this morning has NGT , foley , changing PCA to Dilaudid  to manage pain   Transition of Care Department (TOC)  will continue to monitor patient advancement through interdisciplinary progression rounds. If new patient transition needs arise, please place a TOC consult.

## 2024-01-12 NOTE — Progress Notes (Addendum)
 Patient vomited approx 50 ml of dark bilious emesis. Iv zofran  given, patient reported relief. Primary RN made aware and will update MD.VSS. patient alert and oriented, following commands.

## 2024-01-12 NOTE — Progress Notes (Signed)
 1 Day Post-Op   Subjective/Chief Complaint: Having some pain.  Morphine  less effective.     Objective: Vital signs in last 24 hours: Temp:  [97.1 F (36.2 C)-98.2 F (36.8 C)] 97.1 F (36.2 C) (08/28 0855) Pulse Rate:  [68-94] 68 (08/28 0855) Resp:  [8-18] 17 (08/28 0855) BP: (109-139)/(50-75) 115/60 (08/28 0855) SpO2:  [94 %-100 %] 99 % (08/28 0855) Weight:  [75.8 kg] 75.8 kg (08/27 0923)    Intake/Output from previous day: 08/27 0701 - 08/28 0700 In: 3812.6 [I.V.:3362.6; IV Piggyback:450] Out: 2750 [Urine:2700; Blood:50] Intake/Output this shift: No intake/output data recorded.  Sleepy, easily awakened.  Looks uncomfortable.  Breathing easily Abd soft, non distended, dressing c/d/I Ext warm, well perfused.   Lab Results:  Recent Labs    01/11/24 1725 01/12/24 0537  WBC 13.3* 13.0*  HGB 12.6 12.0  HCT 38.0 35.8*  PLT 357 338   BMET Recent Labs    01/09/24 1020 01/11/24 1725 01/12/24 0537  NA 140  --  140  K 3.2*  --  3.7  CL 103  --  105  CO2 28  --  26  GLUCOSE 104*  --  147*  BUN 17  --  10  CREATININE 0.97 0.80 0.72  CALCIUM  9.5  --  8.4*   PT/INR Recent Labs    01/12/24 0537  LABPROT 15.4*  INR 1.2   ABG No results for input(s): PHART, HCO3 in the last 72 hours.  Invalid input(s): PCO2, PO2  Studies/Results: No results found.  Anti-infectives: Anti-infectives (From admission, onward)    Start     Dose/Rate Route Frequency Ordered Stop   01/11/24 2000  ceFAZolin  (ANCEF ) IVPB 2g/100 mL premix        2 g 200 mL/hr over 30 Minutes Intravenous Every 8 hours 01/11/24 1642 01/11/24 2035   01/11/24 0930  ceFAZolin  (ANCEF ) IVPB 2g/100 mL premix        2 g 200 mL/hr over 30 Minutes Intravenous On call to O.R. 01/11/24 9076 01/11/24 1125       Assessment/Plan: s/p Procedure(s) with comments: GASTRECTOMY, PARTIAL (N/A) - DISTAL GASTRECTOMY LAPAROSCOPY, DIAGNOSTIC (N/A) POD 1 Adenocarcinoma in polyp Await final path Switch to  dilaudid  pca, give 1 x dose.  Discussed giving robaxin  to make more comfortable.  Renew ofirmev  OOB/pulm toilet today.  Keep foley today.  SSI for  DM Antihypertensives IV- BP ok today    LOS: 1 day    Susan Whitaker 01/12/2024

## 2024-01-13 LAB — BASIC METABOLIC PANEL WITH GFR
Anion gap: 8 (ref 5–15)
BUN: 5 mg/dL — ABNORMAL LOW (ref 6–20)
CO2: 29 mmol/L (ref 22–32)
Calcium: 8.6 mg/dL — ABNORMAL LOW (ref 8.9–10.3)
Chloride: 103 mmol/L (ref 98–111)
Creatinine, Ser: 0.62 mg/dL (ref 0.44–1.00)
GFR, Estimated: >60 mL/min (ref >60)
Glucose, Bld: 104 mg/dL — ABNORMAL HIGH (ref 70–99)
Potassium: 3.3 mmol/L — ABNORMAL LOW (ref 3.5–5.1)
Sodium: 140 mmol/L (ref 135–145)

## 2024-01-13 LAB — CBC
HCT: 35.5 % — ABNORMAL LOW (ref 36.0–46.0)
Hemoglobin: 11.6 g/dL — ABNORMAL LOW (ref 12.0–15.0)
MCH: 28.6 pg (ref 26.0–34.0)
MCHC: 32.7 g/dL (ref 30.0–36.0)
MCV: 87.7 fL (ref 80.0–100.0)
Platelets: 358 K/uL (ref 150–400)
RBC: 4.05 MIL/uL (ref 3.87–5.11)
RDW: 14.8 % (ref 11.5–15.5)
WBC: 10.5 K/uL (ref 4.0–10.5)
nRBC: 0 % (ref 0.0–0.2)

## 2024-01-13 LAB — GLUCOSE, CAPILLARY
Glucose-Capillary: 105 mg/dL — ABNORMAL HIGH (ref 70–99)
Glucose-Capillary: 119 mg/dL — ABNORMAL HIGH (ref 70–99)
Glucose-Capillary: 119 mg/dL — ABNORMAL HIGH (ref 70–99)
Glucose-Capillary: 113 mg/dL — ABNORMAL HIGH (ref 70–99)

## 2024-01-13 LAB — MAGNESIUM: Magnesium: 2.1 mg/dL (ref 1.7–2.4)

## 2024-01-13 LAB — PHOSPHORUS: Phosphorus: 1.6 mg/dL — ABNORMAL LOW (ref 2.5–4.6)

## 2024-01-13 MED ORDER — METHOCARBAMOL 1000 MG/10ML IJ SOLN
500.0000 mg | Freq: Three times a day (TID) | INTRAMUSCULAR | Status: DC
  Administered 2024-01-13 – 2024-01-16 (×9): 500 mg via INTRAVENOUS
  Filled 2024-01-13 (×9): qty 10

## 2024-01-13 MED ORDER — METOCLOPRAMIDE HCL 5 MG/ML IJ SOLN: 5.0000 mg | Freq: Three times a day (TID) | INTRAMUSCULAR | Status: AC | PRN

## 2024-01-13 MED ORDER — POTASSIUM PHOSPHATES 15 MMOLE/5ML IV SOLN
30.0000 mmol | Freq: Once | INTRAVENOUS | Status: AC
  Administered 2024-01-13: 30 mmol via INTRAVENOUS
  Filled 2024-01-13: qty 10

## 2024-01-13 MED ORDER — POTASSIUM CHLORIDE 10 MEQ/100ML IV SOLN
10.0000 meq | INTRAVENOUS | Status: AC
  Administered 2024-01-13 (×3): 10 meq via INTRAVENOUS
  Filled 2024-01-13 (×3): qty 100

## 2024-01-13 MED ORDER — POTASSIUM CHLORIDE 10 MEQ/100ML IV SOLN
10.0000 meq | Freq: Once | INTRAVENOUS | Status: AC
Start: 2024-01-13 — End: 2024-01-13
  Administered 2024-01-13: 10 meq via INTRAVENOUS

## 2024-01-13 MED ORDER — CHLORHEXIDINE GLUCONATE CLOTH 2 % EX PADS
6.0000 | MEDICATED_PAD | Freq: Every day | CUTANEOUS | Status: DC
  Administered 2024-01-13 – 2024-01-16 (×4): 6 via TOPICAL

## 2024-01-13 NOTE — Evaluation (Signed)
 Physical Therapy Evaluation Patient Details Name: Susan Whitaker MRN: [id number] DOB: 1971-02-14 Today's Date: 01/13/2024  History of Present Illness  Pt is a 53 y/o female admitted 8/27 with dx of gastric CA, s/p diagnostic laparoscopy and open distal gastrectomy.  PMHx: DM2 GERD, HTN, abdominal adhesion sx  Clinical Impression  Pt admitted with/for Gastric CA with above surgical intervention.  Pt presently needing mod for bed mobility and min assist once up OOB and for ambulation.  Pt currently limited functionally due to the problems listed below.  (see problems list.)  Pt will benefit from PT to maximize function and safety to be able to get home safely with available assist.         If plan is discharge home, recommend the following: A little help with walking and/or transfers;A little help with bathing/dressing/bathroom;Assistance with cooking/housework;Assist for transportation   Can travel by private vehicle        Equipment Recommendations Rolling walker (2 wheels) (If doesn't have RW, consider getting one.)  Recommendations for Other Services       Functional Status Assessment Patient has had a recent decline in their functional status and demonstrates the ability to make significant improvements in function in a reasonable and predictable amount of time.     Precautions / Restrictions Precautions Precautions: None Recall of Precautions/Restrictions: Intact      Mobility  Bed Mobility Overal bed mobility: Needs Assistance Bed Mobility: Rolling, Sidelying to Sit Rolling: Mod assist Sidelying to sit: Mod assist       General bed mobility comments: struggled to roll up over onto R UE, truncal assist over and up to sitting with mod.    Transfers Overall transfer level: Needs assistance Equipment used: Rolling walker (2 wheels) Transfers: Sit to/from Stand Sit to Stand: Min assist           General transfer comment: cues for hand placement heavy min assist  for forward and boost.    Ambulation/Gait Ambulation/Gait assistance: Min assist, Contact guard assist Gait Distance (Feet): 150 Feet Assistive device: Rolling walker (2 wheels) Gait Pattern/deviations: Step-through pattern   Gait velocity interpretation: <1.8 ft/sec, indicate of risk for recurrent falls   General Gait Details: slower, steady steps, short heel/toe pattern.  VSS overall.  Stairs            Wheelchair Mobility     Tilt Bed    Modified Rankin (Stroke Patients Only)       Balance Overall balance assessment: Needs assistance Sitting-balance support: No upper extremity supported, Single extremity supported Sitting balance-Leahy Scale: Fair     Standing balance support: Single extremity supported, No upper extremity supported, During functional activity Standing balance-Leahy Scale: Fair                               Pertinent Vitals/Pain Pain Assessment Pain Assessment: Faces Faces Pain Scale: Hurts little more Pain Location: abdomen Pain Descriptors / Indicators: Sore Pain Intervention(s): Monitored during session    Home Living Family/patient expects to be discharged to:: Private residence Living Arrangements: Children Available Help at Discharge: Family Type of Home: House         Home Layout: One level Home Equipment: None      Prior Function                       Extremity/Trunk Assessment        Lower Extremity Assessment Lower  Extremity Assessment: Overall WFL for tasks assessed (mild proximal weakness)    Cervical / Trunk Assessment Cervical / Trunk Assessment: Normal  Communication   Communication Communication: No apparent difficulties    Cognition Arousal: Alert Behavior During Therapy: WFL for tasks assessed/performed   PT - Cognitive impairments: No apparent impairments                         Following commands: Intact       Cueing Cueing Techniques: Verbal cues      General Comments General comments (skin integrity, edema, etc.): vss overall    Exercises Other Exercises Other Exercises: warm up hip/knees x10+ reps flexion/ext   Assessment/Plan    PT Assessment Patient needs continued PT services  PT Problem List Decreased strength;Decreased activity tolerance;Decreased balance;Decreased mobility;Decreased knowledge of use of DME;Pain       PT Treatment Interventions DME instruction;Gait training;Stair training;Functional mobility training;Therapeutic activities;Balance training;Patient/family education    PT Goals (Current goals can be found in the Care Plan section)  Acute Rehab PT Goals Patient Stated Goal: home as able. PT Goal Formulation: With patient Time For Goal Achievement: 01/28/24 Potential to Achieve Goals: Good    Frequency Min 2X/week     Co-evaluation               AM-PAC PT 6 Clicks Mobility  Outcome Measure Help needed turning from your back to your side while in a flat bed without using bedrails?: A Lot Help needed moving from lying on your back to sitting on the side of a flat bed without using bedrails?: A Lot Help needed moving to and from a bed to a chair (including a wheelchair)?: A Little Help needed standing up from a chair using your arms (e.g., wheelchair or bedside chair)?: A Little Help needed to walk in hospital room?: A Little Help needed climbing 3-5 steps with a railing? : A Lot 6 Click Score: 15    End of Session   Activity Tolerance: Patient tolerated treatment well Patient left: in chair;with call bell/phone within reach;with family/visitor present Nurse Communication: Mobility status PT Visit Diagnosis: Muscle weakness (generalized) (M62.81);Difficulty in walking, not elsewhere classified (R26.2);Other abnormalities of gait and mobility (R26.89)    Time: 1232-1300 PT Time Calculation (min) (ACUTE ONLY): 28 min   Charges:   PT Evaluation $PT Eval Moderate Complexity: 1 Mod PT  Treatments $Gait Training: 8-22 mins PT General Charges $$ ACUTE PT VISIT: 1 Visit         01/13/2024  [place] HERO., PT Acute Rehabilitation Services [phone]  (office)  Vinie GAILS Dewitte Vannice 01/13/2024, 3:36 PM

## 2024-01-13 NOTE — Plan of Care (Signed)
  Problem: Clinical Measurements: Goal: Respiratory complications will improve Outcome: Progressing   Problem: Activity: Goal: Risk for activity intolerance will decrease Outcome: Progressing   Problem: Pain Managment: Goal: General experience of comfort will improve and/or be controlled Outcome: Progressing

## 2024-01-13 NOTE — Progress Notes (Signed)
 2 Days Post-Op   Subjective/Chief Complaint: Complains of a lot of phlegm.  Pain improved, but still a lot.  Had 1 x emesis, small volume. Was oob x 2 h   Objective: Vital signs in last 24 hours: Temp:  [97.1 F (36.2 C)-99.1 F (37.3 C)] 97.7 F (36.5 C) (08/29 0803) Pulse Rate:  [68-85] 77 (08/29 0803) Resp:  [13-19] 17 (08/29 0803) BP: (107-144)/(51-74) 144/74 (08/29 0803) SpO2:  [94 %-100 %] 94 % (08/29 0346) FiO2 (%):  [98 %-100 %] 100 % (08/28 2001) Last BM Date :  (PTA)  Intake/Output from previous day: 08/28 0701 - 08/29 0700 In: 380.8 [I.V.:80.8; IV Piggyback:300] Out: 3225 [Urine:3000; Emesis/NG output:225] Intake/Output this shift: No intake/output data recorded.   Breathing easily. Less uncomfortable Abd soft, non distended, dressing c/d/I Ext warm, well perfused.   Lab Results:  Recent Labs    01/12/24 0537 01/13/24 0328  WBC 13.0* 10.5  HGB 12.0 11.6*  HCT 35.8* 35.5*  PLT 338 358   BMET Recent Labs    01/12/24 0537 01/13/24 0328  NA 140 140  K 3.7 3.3*  CL 105 103  CO2 26 29  GLUCOSE 147* 104*  BUN 10 5*  CREATININE 0.72 0.62  CALCIUM  8.4* 8.6*   PT/INR Recent Labs    01/12/24 0537  LABPROT 15.4*  INR 1.2   ABG No results for input(s): PHART, HCO3 in the last 72 hours.  Invalid input(s): PCO2, PO2  Studies/Results: No results found.  Anti-infectives: Anti-infectives (From admission, onward)    Start     Dose/Rate Route Frequency Ordered Stop   01/11/24 2000  ceFAZolin  (ANCEF ) IVPB 2g/100 mL premix        2 g 200 mL/hr over 30 Minutes Intravenous Every 8 hours 01/11/24 1642 01/11/24 2035   01/11/24 0930  ceFAZolin  (ANCEF ) IVPB 2g/100 mL premix        2 g 200 mL/hr over 30 Minutes Intravenous On call to O.R. 01/11/24 0923 01/11/24 1125       Assessment/Plan: s/p Procedure(s) with comments: GASTRECTOMY, PARTIAL (N/A) - DISTAL GASTRECTOMY LAPAROSCOPY, DIAGNOSTIC (N/A) POD 2  Adenocarcinoma in polyp Await  final path Dilaudid  pca Change to standing robaxin  D/c ngt Hypokalemia - replete OOB/pulm toilet today.  D/c foley SSI for  DM Antihypertensives IV- BP ok today    LOS: 2 days    Susan Whitaker 01/13/2024

## 2024-01-14 LAB — GLUCOSE, CAPILLARY
Glucose-Capillary: 116 mg/dL — ABNORMAL HIGH (ref 70–99)
Glucose-Capillary: 110 mg/dL — ABNORMAL HIGH (ref 70–99)
Glucose-Capillary: 105 mg/dL — ABNORMAL HIGH (ref 70–99)
Glucose-Capillary: 120 mg/dL — ABNORMAL HIGH (ref 70–99)

## 2024-01-14 LAB — BASIC METABOLIC PANEL WITH GFR
Anion gap: 13 (ref 5–15)
BUN: 8 mg/dL (ref 6–20)
CO2: 24 mmol/L (ref 22–32)
Calcium: 9 mg/dL (ref 8.9–10.3)
Chloride: 101 mmol/L (ref 98–111)
Creatinine, Ser: 0.62 mg/dL (ref 0.44–1.00)
GFR, Estimated: >60 mL/min (ref >60)
Glucose, Bld: 120 mg/dL — ABNORMAL HIGH (ref 70–99)
Potassium: 3.4 mmol/L — ABNORMAL LOW (ref 3.5–5.1)
Sodium: 138 mmol/L (ref 135–145)

## 2024-01-14 LAB — CBC
HCT: 38.4 % (ref 36.0–46.0)
Hemoglobin: 12.6 g/dL (ref 12.0–15.0)
MCH: 29.2 pg (ref 26.0–34.0)
MCHC: 32.8 g/dL (ref 30.0–36.0)
MCV: 88.9 fL (ref 80.0–100.0)
Platelets: 338 K/uL (ref 150–400)
RBC: 4.32 MIL/uL (ref 3.87–5.11)
RDW: 14.5 % (ref 11.5–15.5)
WBC: 10.3 K/uL (ref 4.0–10.5)
nRBC: 0 % (ref 0.0–0.2)

## 2024-01-14 LAB — PHOSPHORUS: Phosphorus: 2.7 mg/dL (ref 2.5–4.6)

## 2024-01-14 LAB — MAGNESIUM: Magnesium: 2.1 mg/dL (ref 1.7–2.4)

## 2024-01-14 MED ORDER — AMLODIPINE BESYLATE 10 MG PO TABS
10.0000 mg | ORAL_TABLET | Freq: Every day | ORAL | Status: DC
  Administered 2024-01-14 – 2024-01-19 (×6): 10 mg via ORAL
  Filled 2024-01-14 (×6): qty 1

## 2024-01-14 MED ORDER — POTASSIUM CHLORIDE 10 MEQ/100ML IV SOLN
10.0000 meq | INTRAVENOUS | Status: AC
  Administered 2024-01-14 (×4): 10 meq via INTRAVENOUS
  Filled 2024-01-14 (×4): qty 100

## 2024-01-14 NOTE — Progress Notes (Signed)
 3 Days Post-Op   Subjective/Chief Complaint: Tolerated NGT out and some clears without n/v.  Some belching.  No flatus.  Ambulated in hall.  Doing IS.     Objective: Vital signs in last 24 hours: Temp:  [97.7 F (36.5 C)-100 F (37.8 C)] 98.7 F (37.1 C) (08/30 0818) Pulse Rate:  [70-83] 70 (08/30 0818) Resp:  [9-18] 11 (08/30 0856) BP: (139-153)/(73-78) 153/73 (08/30 0818) SpO2:  [95 %-100 %] 95 % (08/30 0856) Last BM Date :  (PTA)  Intake/Output from previous day: 08/29 0701 - 08/30 0700 In: 220 [P.O.:220] Out: -  Intake/Output this shift: No intake/output data recorded.  In bed, but more awake.  Looks comfortable.   Abd soft, non distended, approp tender.  dressing c/d/I Ext warm, well perfused. No edema.    Lab Results:  Recent Labs    01/13/24 0328 01/14/24 0800  WBC 10.5 10.3  HGB 11.6* 12.6  HCT 35.5* 38.4  PLT 358 338   BMET Recent Labs    01/13/24 0328 01/14/24 0800  NA 140 138  K 3.3* 3.4*  CL 103 101  CO2 29 24  GLUCOSE 104* 120*  BUN 5* 8  CREATININE 0.62 0.62  CALCIUM  8.6* 9.0   PT/INR Recent Labs    01/12/24 0537  LABPROT 15.4*  INR 1.2   ABG No results for input(s): PHART, HCO3 in the last 72 hours.  Invalid input(s): PCO2, PO2  Studies/Results: No results found.  Anti-infectives: Anti-infectives (From admission, onward)    Start     Dose/Rate Route Frequency Ordered Stop   01/11/24 2000  ceFAZolin  (ANCEF ) IVPB 2g/100 mL premix        2 g 200 mL/hr over 30 Minutes Intravenous Every 8 hours 01/11/24 1642 01/11/24 2035   01/11/24 0930  ceFAZolin  (ANCEF ) IVPB 2g/100 mL premix        2 g 200 mL/hr over 30 Minutes Intravenous On call to O.R. 01/11/24 [id number] 01/11/24 1125       Assessment/Plan: s/p Procedure(s) with comments: GASTRECTOMY, PARTIAL (N/A) - DISTAL GASTRECTOMY LAPAROSCOPY, DIAGNOSTIC (N/A) POD 3  Adenocarcinoma in polyp, await final path Dilaudid  pca, keep until tolerating full liquids.  Continue  standing robaxin  IV for now also.  Advance today to fulls. D/c ngt Hypokalemia - replete again today.  OOB/pulm toilet today.  Urinating without foley.  SSI for  DM HTN- add norvasc  today, PRN iv Continue PT.     LOS: 3 days    [name] 01/14/2024

## 2024-01-14 NOTE — Plan of Care (Signed)
  Problem: Health Behavior/Discharge Planning: Goal: Ability to manage health-related needs will improve Outcome: Progressing   Problem: Clinical Measurements: Goal: Ability to maintain clinical measurements within normal limits will improve Outcome: Progressing Goal: Will remain free from infection Outcome: Progressing   Problem: Nutrition: Goal: Adequate nutrition will be maintained Outcome: Progressing   Problem: Coping: Goal: Level of anxiety will decrease Outcome: Progressing   

## 2024-01-14 NOTE — Plan of Care (Signed)
  Problem: Education: Goal: Knowledge of General Education information will improve Description: Including pain rating scale, medication(s)/side effects and non-pharmacologic comfort measures Outcome: Progressing   Problem: Health Behavior/Discharge Planning: Goal: Ability to manage health-related needs will improve Outcome: Progressing   Problem: Activity: Goal: Risk for activity intolerance will decrease Outcome: Progressing   Problem: Pain Managment: Goal: General experience of comfort will improve and/or be controlled Outcome: Progressing   Problem: Nutritional: Goal: Maintenance of adequate nutrition will improve Outcome: Progressing

## 2024-01-15 LAB — GLUCOSE, CAPILLARY
Glucose-Capillary: 102 mg/dL — ABNORMAL HIGH (ref 70–99)
Glucose-Capillary: 131 mg/dL — ABNORMAL HIGH (ref 70–99)
Glucose-Capillary: 110 mg/dL — ABNORMAL HIGH (ref 70–99)
Glucose-Capillary: 147 mg/dL — ABNORMAL HIGH (ref 70–99)
Glucose-Capillary: 123 mg/dL — ABNORMAL HIGH (ref 70–99)

## 2024-01-15 LAB — BASIC METABOLIC PANEL WITH GFR
Anion gap: 11 (ref 5–15)
BUN: 6 mg/dL (ref 6–20)
CO2: 28 mmol/L (ref 22–32)
Calcium: 9.2 mg/dL (ref 8.9–10.3)
Chloride: 100 mmol/L (ref 98–111)
Creatinine, Ser: 0.63 mg/dL (ref 0.44–1.00)
GFR, Estimated: >60 mL/min (ref >60)
Glucose, Bld: 93 mg/dL (ref 70–99)
Potassium: 3.1 mmol/L — ABNORMAL LOW (ref 3.5–5.1)
Sodium: 139 mmol/L (ref 135–145)

## 2024-01-15 LAB — CBC
HCT: 40 % (ref 36.0–46.0)
Hemoglobin: 13.2 g/dL (ref 12.0–15.0)
MCH: 28.9 pg (ref 26.0–34.0)
MCHC: 33 g/dL (ref 30.0–36.0)
MCV: 87.7 fL (ref 80.0–100.0)
Platelets: 394 K/uL (ref 150–400)
RBC: 4.56 MIL/uL (ref 3.87–5.11)
RDW: 14.3 % (ref 11.5–15.5)
WBC: 6.8 K/uL (ref 4.0–10.5)
nRBC: 0 % (ref 0.0–0.2)

## 2024-01-15 LAB — PHOSPHORUS: Phosphorus: 3.1 mg/dL (ref 2.5–4.6)

## 2024-01-15 LAB — MAGNESIUM: Magnesium: 2.3 mg/dL (ref 1.7–2.4)

## 2024-01-15 MED ORDER — ACETAMINOPHEN 500 MG PO TABS
1000.0000 mg | ORAL_TABLET | Freq: Three times a day (TID) | ORAL | Status: DC
  Administered 2024-01-15 – 2024-01-19 (×12): 1000 mg via ORAL
  Filled 2024-01-15 (×12): qty 2

## 2024-01-15 MED ORDER — POTASSIUM CHLORIDE 10 MEQ/100ML IV SOLN
10.0000 meq | INTRAVENOUS | Status: AC
  Administered 2024-01-15 (×3): 10 meq via INTRAVENOUS
  Filled 2024-01-15 (×2): qty 100

## 2024-01-15 MED ORDER — OXYCODONE HCL 5 MG PO TABS
5.0000 mg | ORAL_TABLET | ORAL | Status: DC | PRN
  Administered 2024-01-17 – 2024-01-19 (×8): 10 mg via ORAL
  Filled 2024-01-15 (×9): qty 2

## 2024-01-15 MED ORDER — HYDROMORPHONE HCL 1 MG/ML IJ SOLN
1.0000 mg | INTRAMUSCULAR | Status: DC | PRN
  Administered 2024-01-16: 1 mg via INTRAVENOUS
  Filled 2024-01-15: qty 1

## 2024-01-15 MED ORDER — POTASSIUM CHLORIDE 10 MEQ/100ML IV SOLN
10.0000 meq | INTRAVENOUS | Status: AC
  Administered 2024-01-15 (×3): 10 meq via INTRAVENOUS
  Filled 2024-01-15 (×4): qty 100

## 2024-01-15 MED ORDER — GABAPENTIN 100 MG PO CAPS
200.0000 mg | ORAL_CAPSULE | Freq: Three times a day (TID) | ORAL | Status: DC
  Administered 2024-01-15 – 2024-01-19 (×13): 200 mg via ORAL
  Filled 2024-01-15 (×13): qty 2

## 2024-01-15 MED ORDER — TRAMADOL HCL 50 MG PO TABS: 50.0000 mg | ORAL_TABLET | Freq: Four times a day (QID) | ORAL | Status: DC | PRN

## 2024-01-15 MED ORDER — POTASSIUM CHLORIDE CRYS ER 20 MEQ PO TBCR
20.0000 meq | EXTENDED_RELEASE_TABLET | Freq: Two times a day (BID) | ORAL | Status: DC
  Administered 2024-01-15 (×2): 20 meq via ORAL
  Filled 2024-01-15 (×3): qty 1

## 2024-01-15 NOTE — Plan of Care (Signed)

## 2024-01-15 NOTE — Progress Notes (Signed)
 Patient's IV potassium was paused for about 20 minutes due to family pressing the silent button on the IV pump when it beeps. Family and patient was educated on notifying the nurse when the IV pump is beeping due to pausing the medication. Family and patient verbalized understanding. Pharmacy was contacted to adjust order for remaining doses.

## 2024-01-15 NOTE — Progress Notes (Signed)
 4 Days Post-Op   Subjective/Chief Complaint: Tolerated full liquids.  Main issue is pain.  Ambulating.  No n/v.  Having flatus.     Objective: Vital signs in last 24 hours: Temp:  [98.1 F (36.7 C)-99.8 F (37.7 C)] 98.2 F (36.8 C) (08/31 0800) Pulse Rate:  [66-84] 82 (08/31 0800) Resp:  [11-18] 13 (08/31 0830) BP: (131-153)/(56-86) 131/70 (08/31 0827) SpO2:  [95 %-100 %] 100 % (08/31 0830) Last BM Date :  (PTA)  Intake/Output from previous day: No intake/output data recorded. Intake/Output this shift: No intake/output data recorded.  A&o x 3 Abd soft, non distended, approp tender.  dressing c/d/I Ext warm, well perfused. No edema.    Lab Results:  Recent Labs    01/14/24 0800 01/15/24 0500  WBC 10.3 6.8  HGB 12.6 13.2  HCT 38.4 40.0  PLT 338 394   BMET Recent Labs    01/14/24 0800 01/15/24 0500  NA 138 139  K 3.4* 3.1*  CL 101 100  CO2 24 28  GLUCOSE 120* 93  BUN 8 6  CREATININE 0.62 0.63  CALCIUM  9.0 9.2   PT/INR No results for input(s): LABPROT, INR in the last 72 hours.  ABG No results for input(s): PHART, HCO3 in the last 72 hours.  Invalid input(s): PCO2, PO2  Studies/Results: No results found.  Anti-infectives: Anti-infectives (From admission, onward)    Start     Dose/Rate Route Frequency Ordered Stop   01/11/24 2000  ceFAZolin  (ANCEF ) IVPB 2g/100 mL premix        2 g 200 mL/hr over 30 Minutes Intravenous Every 8 hours 01/11/24 1642 01/11/24 2035   01/11/24 0930  ceFAZolin  (ANCEF ) IVPB 2g/100 mL premix        2 g 200 mL/hr over 30 Minutes Intravenous On call to O.R. 01/11/24 [id number] 01/11/24 1125       Assessment/Plan: s/p Procedure(s) with comments: GASTRECTOMY, PARTIAL (N/A) - DISTAL GASTRECTOMY LAPAROSCOPY, DIAGNOSTIC (N/A) POD 4  Adenocarcinoma in polyp, await final path D/c pca.  Standing robaxin , tylenol , gabapentin . Prn oxy, dilaudid .   Advance today to soft diet.  Hypokalemia - replete again today.   OOB/pulm toilet today.  Urinating without foley.  SSI for  DM HTN- norvasc , PRN iv Continue PT.    Nutrition consult.     LOS: 4 days    [name] 01/15/2024

## 2024-01-16 LAB — BASIC METABOLIC PANEL WITH GFR
Anion gap: 10 (ref 5–15)
BUN: 11 mg/dL (ref 6–20)
CO2: 29 mmol/L (ref 22–32)
Calcium: 9.5 mg/dL (ref 8.9–10.3)
Chloride: 102 mmol/L (ref 98–111)
Creatinine, Ser: 0.73 mg/dL (ref 0.44–1.00)
GFR, Estimated: >60 mL/min (ref >60)
Glucose, Bld: 101 mg/dL — ABNORMAL HIGH (ref 70–99)
Potassium: 3.7 mmol/L (ref 3.5–5.1)
Sodium: 141 mmol/L (ref 135–145)

## 2024-01-16 LAB — CBC
HCT: 37.5 % (ref 36.0–46.0)
Hemoglobin: 12.3 g/dL (ref 12.0–15.0)
MCH: 29 pg (ref 26.0–34.0)
MCHC: 32.8 g/dL (ref 30.0–36.0)
MCV: 88.4 fL (ref 80.0–100.0)
Platelets: 398 K/uL (ref 150–400)
RBC: 4.24 MIL/uL (ref 3.87–5.11)
RDW: 14.4 % (ref 11.5–15.5)
WBC: 6.6 K/uL (ref 4.0–10.5)
nRBC: 0 % (ref 0.0–0.2)

## 2024-01-16 LAB — MAGNESIUM: Magnesium: 2.3 mg/dL (ref 1.7–2.4)

## 2024-01-16 LAB — GLUCOSE, CAPILLARY
Glucose-Capillary: 99 mg/dL (ref 70–99)
Glucose-Capillary: 128 mg/dL — ABNORMAL HIGH (ref 70–99)
Glucose-Capillary: 99 mg/dL (ref 70–99)
Glucose-Capillary: 113 mg/dL — ABNORMAL HIGH (ref 70–99)

## 2024-01-16 LAB — PHOSPHORUS: Phosphorus: 3.5 mg/dL (ref 2.5–4.6)

## 2024-01-16 MED ORDER — GLUCERNA SHAKE PO LIQD
237.0000 mL | Freq: Three times a day (TID) | ORAL | Status: DC
  Administered 2024-01-16 – 2024-01-17 (×3): 237 mL via ORAL

## 2024-01-16 MED ORDER — POTASSIUM CHLORIDE CRYS ER 20 MEQ PO TBCR
30.0000 meq | EXTENDED_RELEASE_TABLET | Freq: Two times a day (BID) | ORAL | Status: DC
  Administered 2024-01-16 – 2024-01-19 (×7): 30 meq via ORAL
  Filled 2024-01-16 (×7): qty 1

## 2024-01-16 MED ORDER — ADULT MULTIVITAMIN W/MINERALS CH
1.0000 | ORAL_TABLET | Freq: Every day | ORAL | Status: DC
  Administered 2024-01-16 – 2024-01-19 (×4): 1 via ORAL
  Filled 2024-01-16 (×4): qty 1

## 2024-01-16 MED ORDER — METHOCARBAMOL 500 MG PO TABS
500.0000 mg | ORAL_TABLET | Freq: Three times a day (TID) | ORAL | Status: DC
  Administered 2024-01-16 – 2024-01-19 (×10): 500 mg via ORAL
  Filled 2024-01-16 (×10): qty 1

## 2024-01-16 NOTE — Progress Notes (Signed)
 5 Days Post-Op   Subjective/Chief Complaint: Had a rough day yesterday.  The IV potassium hurt a lot more yesterday and she had a hard time coming off the PCA.     Objective: Vital signs in last 24 hours: Temp:  [97.7 F (36.5 C)-98.8 F (37.1 C)] 98.1 F (36.7 C) (09/01 0725) Pulse Rate:  [61-72] 72 (09/01 0725) Resp:  [16-17] 17 (09/01 0725) BP: (110-123)/(64-73) 110/70 (09/01 0725) SpO2:  [95 %-100 %] 98 % (09/01 0725) Last BM Date :  (PTA)  Intake/Output from previous day: 08/31 0701 - 09/01 0700 In: 240 [P.O.:240] Out: 0  Intake/Output this shift: No intake/output data recorded.  A&o x 3 Abd soft, non distended, approp tender.  dressing c/d/I Ext warm, well perfused. No edema.    Lab Results:  Recent Labs    01/15/24 0500 01/16/24 0722  WBC 6.8 6.6  HGB 13.2 12.3  HCT 40.0 37.5  PLT 394 398   BMET Recent Labs    01/15/24 0500 01/16/24 0722  NA 139 141  K 3.1* 3.7  CL 100 102  CO2 28 29  GLUCOSE 93 101*  BUN 6 11  CREATININE 0.63 0.73  CALCIUM  9.2 9.5   PT/INR No results for input(s): LABPROT, INR in the last 72 hours.  ABG No results for input(s): PHART, HCO3 in the last 72 hours.  Invalid input(s): PCO2, PO2  Studies/Results: No results found.  Anti-infectives: Anti-infectives (From admission, onward)    Start     Dose/Rate Route Frequency Ordered Stop   01/11/24 2000  ceFAZolin  (ANCEF ) IVPB 2g/100 mL premix        2 g 200 mL/hr over 30 Minutes Intravenous Every 8 hours 01/11/24 1642 01/11/24 2035   01/11/24 0930  ceFAZolin  (ANCEF ) IVPB 2g/100 mL premix        2 g 200 mL/hr over 30 Minutes Intravenous On call to O.R. 01/11/24 0923 01/11/24 1125       Assessment/Plan: s/p Procedure(s) with comments: GASTRECTOMY, PARTIAL (N/A) - DISTAL GASTRECTOMY LAPAROSCOPY, DIAGNOSTIC (N/A) POD 5  Adenocarcinoma in polyp, await final path  Standing robaxin  (switch to oral), tylenol , gabapentin . Prn oxy, dilaudid .   soft diet.   Hypokalemia - improved.   OOB/pulm toilet today.  Urinating without foley.  SSI for  DM HTN- norvasc , PRN iv Continue PT.    Nutrition consult.  Calorie counts.  Dispo:  work on improved pain control. Work on po intake. Probable home wednesday   LOS: 5 days    Susan Whitaker 01/16/2024

## 2024-01-16 NOTE — Progress Notes (Addendum)
 Initial Nutrition Assessment  DOCUMENTATION CODES:   Not applicable  INTERVENTION:  Encourage PO intake - Currently on GI soft diet  Emphasis on protein intake Small frequent meals 48 hour calorie count  Glucerna Shake po TID, each supplement provides 220 kcal and 10 grams of protein MVI with minerals daily  High calorie, high protein handout in AVS   NUTRITION DIAGNOSIS:   Increased nutrient needs related to post-op healing as evidenced by estimated needs.  GOAL:   Patient will meet greater than or equal to 90% of their needs  MONITOR:   PO intake, Diet advancement, Supplement acceptance  REASON FOR ASSESSMENT:   Consult Assessment of nutrition requirement/status, Diet education, Calorie Count  ASSESSMENT:  53 y.o female who presented to [facility] for surgery with the diagnosis of gastric cancer. Now s/p partial distal gastrectomy. PMH of GERD, HTN, HLD, SBO, T2DM.   8/27 - Admitted, s/p Diagnostic laparoscopy, open distal partial gastrectomy   RD working remotely, attempted to call pt's room with no answer, history obtained through chart review. Follows with [facility] outpatient.   She experienced persistent intermittent abdominal pain since her surgery in January 2021 for a bowel obstruction. She is cautious about her diet to avoid hospital readmission. Unknown what patient has been eating since then, hopefully pt will be able to explain in more detail on follow up. Based on weight history in the last year pt does appear to have been losing weight. Pt has lost 13 lbs, 7% of her body weight In 1 year, going from 180 lbs to 167 lbs. If current weight is accurate she has lost 11 lbs, 6% in 3 months going from 178 lbs to 167 lbs. This is not clinically significant but is concerning given pt's current diagnosis and recent surgery.   Pt is now POD 5. Has been able to tolerate a CLD and FLD diet, now advanced to a GI soft diet. Unknown how pt has been eating during admission,  limited meals documented by nursing. Pt did have 50% of her breakfast yesterday of broth, cranberry juice, orange sherbet, vanilla greek yogurt and fruit ice. Calorie count ordered by MD to access PO intake prior to discharge. Pt may be malnourished but unable to verify at this time, now has higher nutritional needs related to pos surgery. Add ONS and MVI. No noted BM yet, passing flatus.   Disposition: Home on Wednesday?   Admit weight: 75.8 kg Current weight: 75.8 kg 09/2023 - 81 kg  02/2023 - 82 kg    Average Meal Intake: 8/31: 50% intake x 1 recorded meals  Nutritionally Relevant Medications: Scheduled Meds:  acetaminophen   1,000 mg Oral Q8H   amLODipine   10 mg Oral Daily   Chlorhexidine  Gluconate Cloth  6 each Topical Daily   enoxaparin  (LOVENOX ) injection  40 mg Subcutaneous Q24H   gabapentin   200 mg Oral TID   insulin  aspart  0-15 Units Subcutaneous TID WC   methocarbamol   500 mg Oral TID   potassium chloride   30 mEq Oral BID   Labs Reviewed: CBG ranges from 99-123 mg/dL over the last 24 hours HgbA1c 5.8  NUTRITION - FOCUSED PHYSICAL EXAM: - Deferred to follow up   Diet Order:   Diet Order             DIET SOFT Room service appropriate? Yes; Fluid consistency: Thin  Diet effective now                   EDUCATION  NEEDS:   Not appropriate for education at this time  Skin:  Skin Assessment: Skin Integrity Issues: Skin Integrity Issues:: Incisions Incisions: Closed surgical incision  Last BM:  PTA  Height:   Ht Readings from Last 1 Encounters:  01/11/24 5' 3 (1.6 m)    Weight:   Wt Readings from Last 1 Encounters:  01/11/24 75.8 kg    Ideal Body Weight:  52.3 kg  BMI:  Body mass index is 29.58 kg/m.  Estimated Nutritional Needs:   Kcal:  1900-2100 kcal  Protein:  100-120 gm  Fluid:  >1.9L/day   Olivia Kenning, RD Registered Dietitian  See Amion for more information

## 2024-01-16 NOTE — Plan of Care (Signed)
   Problem: Education: Goal: Knowledge of General Education information will improve Description Including pain rating scale, medication(s)/side effects and non-pharmacologic comfort measures Outcome: Progressing   Problem: Health Behavior/Discharge Planning: Goal: Ability to manage health-related needs will improve Outcome: Progressing

## 2024-01-16 NOTE — Progress Notes (Signed)
 Physical Therapy Treatment Patient Details Name: Susan Whitaker MRN: [id number] DOB: 06-10-1970 Today's Date: 01/16/2024   History of Present Illness Pt is a 53 y/o female admitted 8/27 with dx of gastric CA, s/p diagnostic laparoscopy and open distal gastrectomy.  PMHx: DM2 GERD, HTN, abdominal adhesion sx    PT Comments  Pt resting in bed on arrival, pleasant and agreeable to session and demonstrating continued progress towards acute goals. Pt demonstrating bed mobility, transfers and gait with RW for support with grossly CGA-supervision for safety. Educated pt on IS use with pt demonstrating proper technique and verbalizing understanding of use and frequency. Pt continues to benefit from skilled PT services to progress toward functional mobility goals.     If plan is discharge home, recommend the following: A little help with walking and/or transfers;A little help with bathing/dressing/bathroom;Assistance with cooking/housework;Assist for transportation   Can travel by private vehicle        Equipment Recommendations  Rolling walker (2 wheels) (If doesn't have RW, consider getting one.)    Recommendations for Other Services       Precautions / Restrictions Precautions Precautions: None Recall of Precautions/Restrictions: Intact Restrictions Weight Bearing Restrictions Per Provider Order: No     Mobility  Bed Mobility Overal bed mobility: Needs Assistance Bed Mobility: Rolling, Sidelying to Sit Rolling: Contact guard assist Sidelying to sit: Min assist, HOB elevated       General bed mobility comments: rolling to R EOB with CGA for safety, light min A to elevate trunk to midline    Transfers Overall transfer level: Needs assistance Equipment used: Rolling walker (2 wheels) Transfers: Sit to/from Stand Sit to Stand: Contact guard assist           General transfer comment: sstanding from EOB at lowest height, light cues for hand placementon rise     Ambulation/Gait Ambulation/Gait assistance: Supervision Gait Distance (Feet): 400 Feet Assistive device: Rolling walker (2 wheels) Gait Pattern/deviations: Step-through pattern Gait velocity: decr     General Gait Details: guarded step through pattern with RW for support,   Stairs             Wheelchair Mobility     Tilt Bed    Modified Rankin (Stroke Patients Only)       Balance Overall balance assessment: Needs assistance Sitting-balance support: No upper extremity supported, Single extremity supported Sitting balance-Leahy Scale: Fair     Standing balance support: Single extremity supported, No upper extremity supported, During functional activity Standing balance-Leahy Scale: Fair                              Hotel manager: No apparent difficulties  Cognition Arousal: Alert Behavior During Therapy: WFL for tasks assessed/performed   PT - Cognitive impairments: No apparent impairments                         Following commands: Intact      Cueing Cueing Techniques: Verbal cues  Exercises Other Exercises Other Exercises: educated on proper IS use with pt able to demo back with cues, verbalizing understanding of x10/hr    General Comments General comments (skin integrity, edema, etc.): VSS on RA, daughter present and supportive      Pertinent Vitals/Pain Pain Assessment Pain Assessment: Faces Faces Pain Scale: Hurts little more Pain Location: abdomen Pain Descriptors / Indicators: Sore Pain Intervention(s): Monitored during session, Limited activity within patient's tolerance,  Repositioned    Home Living                          Prior Function            PT Goals (current goals can now be found in the care plan section) Acute Rehab PT Goals Patient Stated Goal: home as able. PT Goal Formulation: With patient Time For Goal Achievement: 01/28/24 Progress towards PT goals:  Progressing toward goals    Frequency    Min 2X/week      PT Plan      Co-evaluation              AM-PAC PT 6 Clicks Mobility   Outcome Measure  Help needed turning from your back to your side while in a flat bed without using bedrails?: A Little Help needed moving from lying on your back to sitting on the side of a flat bed without using bedrails?: A Lot Help needed moving to and from a bed to a chair (including a wheelchair)?: A Little Help needed standing up from a chair using your arms (e.g., wheelchair or bedside chair)?: A Little Help needed to walk in hospital room?: A Little Help needed climbing 3-5 steps with a railing? : A Lot 6 Click Score: 16    End of Session   Activity Tolerance: Patient tolerated treatment well Patient left: in chair;with call bell/phone within reach;with family/visitor present Nurse Communication: Mobility status PT Visit Diagnosis: Muscle weakness (generalized) (M62.81);Difficulty in walking, not elsewhere classified (R26.2);Other abnormalities of gait and mobility (R26.89)     Time: 0950-1008 PT Time Calculation (min) (ACUTE ONLY): 18 min  Charges:    $Therapeutic Activity: 8-22 mins PT General Charges $$ ACUTE PT VISIT: 1 Visit                     Frimy Uffelman R. PTA Acute Rehabilitation Services Office: [phone]   Therisa CHRISTELLA Boor 01/16/2024, 10:22 AM

## 2024-01-17 LAB — BASIC METABOLIC PANEL WITH GFR
Anion gap: 13 (ref 5–15)
BUN: 15 mg/dL (ref 6–20)
CO2: 27 mmol/L (ref 22–32)
Calcium: 9.4 mg/dL (ref 8.9–10.3)
Chloride: 103 mmol/L (ref 98–111)
Creatinine, Ser: 0.92 mg/dL (ref 0.44–1.00)
GFR, Estimated: >60 mL/min (ref >60)
Glucose, Bld: 103 mg/dL — ABNORMAL HIGH (ref 70–99)
Potassium: 3.9 mmol/L (ref 3.5–5.1)
Sodium: 143 mmol/L (ref 135–145)

## 2024-01-17 LAB — SURGICAL PATHOLOGY

## 2024-01-17 LAB — GLUCOSE, CAPILLARY: Glucose-Capillary: 144 mg/dL — ABNORMAL HIGH (ref 70–99)

## 2024-01-17 MED ORDER — ENSURE PLUS HIGH PROTEIN PO LIQD
237.0000 mL | Freq: Two times a day (BID) | ORAL | Status: DC
  Administered 2024-01-17 – 2024-01-19 (×4): 237 mL via ORAL

## 2024-01-17 NOTE — Progress Notes (Signed)
 Mobility Specialist Progress Note:    01/17/24 1017  Mobility  Activity Ambulated with assistance (In hallway)  Level of Assistance Standby assist, set-up cues, supervision of patient - no hands on  Assistive Device Front wheel walker  Distance Ambulated (ft) 400 ft  Activity Response Tolerated well  Mobility Referral Yes  Mobility visit 1 Mobility  Mobility Specialist Start Time (ACUTE ONLY) 0959  Mobility Specialist Stop Time (ACUTE ONLY) 1014  Mobility Specialist Time Calculation (min) (ACUTE ONLY) 15 min   Received pt in chair and eager for mobility. No physical assistance needed. No c/o. Returned pt to room without fault. Left pt in chair. Personal belongings and call light within reach. All needs met.  Lavanda Pollack Mobility Specialist  Please contact via [employer] or  Rehab Office [phone]

## 2024-01-17 NOTE — Progress Notes (Signed)
 Calorie Count Note  48 hour calorie count ordered.  Diet: Soft diet, thin liquids  Supplements: Glucerna  Day 1 9/1: Breakfast: 100% sausage, 25% eggs,50% greek yogurt, 100% 2 cranberry juice (440 kcal, 18 gm protein) Lunch: 50% braised beef with mashed potatoes and gravy, 100% 2 cranberry juice (360 kcal, 11 gm protein)  Dinner: 50% braised beef with mashed potatoes and gravy, 100% 2 cranberry juice (360 kcal, 11 gm protein)  Supplements: 50% Glucerna (110 kcal, 9 gm protein)  Total intake: 1270 kcal (66% of minimum estimated needs)  49 gm protein (49% of minimum estimated needs)  Day 2, 9/2: Breakfast: 100% sausage, 25% eggs,50% greek yogurt, 100% 2 cranberry juice (440 kcal, 18 gm protein)  Estimated Nutritional Needs:  Kcal:  1900-2100 kcal Protein:  100-120 gm Fluid:  >1.9L/day  Olivia Kenning, RD Registered Dietitian  See Amion for more information

## 2024-01-17 NOTE — Progress Notes (Signed)
 6 Days Post-Op   Subjective/Chief Complaint: Had some nausea with chocolate glucerna but doesn't like that flavor.  Eating around 50% of some items on trays.     Objective: Vital signs in last 24 hours: Temp:  [97.6 F (36.4 C)-98.4 F (36.9 C)] 98.2 F (36.8 C) (09/02 0448) Pulse Rate:  [71-79] 79 (09/02 0448) Resp:  [17-18] 17 (09/02 0448) BP: (106-122)/(66-72) 106/66 (09/02 0448) SpO2:  [95 %-99 %] 99 % (09/02 0448) Last BM Date : 01/16/24 (x3)  Intake/Output from previous day: 09/01 0701 - 09/02 0700 In: 480 [P.O.:480] Out: -  Intake/Output this shift: No intake/output data recorded.  A&o x 3 Abd soft, non distended, approp tender.  dressing c/d/I. Removed.  No erythema or drainage from wound.   Ext warm, well perfused. No edema.    Lab Results:  Recent Labs    01/15/24 0500 01/16/24 0722  WBC 6.8 6.6  HGB 13.2 12.3  HCT 40.0 37.5  PLT 394 398   BMET Recent Labs    01/16/24 0722 01/17/24 0630  NA 141 143  K 3.7 3.9  CL 102 103  CO2 29 27  GLUCOSE 101* 103*  BUN 11 15  CREATININE 0.73 0.92  CALCIUM  9.5 9.4   PT/INR No results for input(s): LABPROT, INR in the last 72 hours.  ABG No results for input(s): PHART, HCO3 in the last 72 hours.  Invalid input(s): PCO2, PO2  Studies/Results: No results found.  Anti-infectives: Anti-infectives (From admission, onward)    Start     Dose/Rate Route Frequency Ordered Stop   01/11/24 2000  ceFAZolin  (ANCEF ) IVPB 2g/100 mL premix        2 g 200 mL/hr over 30 Minutes Intravenous Every 8 hours 01/11/24 1642 01/11/24 2035   01/11/24 0930  ceFAZolin  (ANCEF ) IVPB 2g/100 mL premix        2 g 200 mL/hr over 30 Minutes Intravenous On call to O.R. 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 Prn oxy, dilaudid .   soft diet.  Hypokalemia - continue oral potassium supplements.   OOB/pulm toilet today.  Urinating without foley.  SSI for  DM. Very sparing. D/c'd SSI.  Changed FSG to BID.   HTN- norvasc  home med Continue PT.  Improving stability  Nutrition consult.  Calorie counts.  Dispo:  work on improved pain control. Work on po intake. Probable home wednesday   LOS: 6 days    [name] 01/17/2024

## 2024-01-17 NOTE — Progress Notes (Signed)
 Nutrition Follow-up  DOCUMENTATION CODES:   Not applicable  INTERVENTION:  Encourage PO intake - Currently on GI soft diet  Emphasis on protein intake Small frequent meals 48 hour calorie count  Ensure Plus High Protein po BID, each supplement provides 350 kcal and 20 grams of protein MVI with minerals daily  Discussed and Provided High calorie high protein handout,Diet for Dumping Syndrome handout, and Gastric Surgery Nutrition Therapy handout  Referral to [facility] Center Dietitian   NUTRITION DIAGNOSIS:   Increased nutrient needs related to post-op healing as evidenced by estimated needs. - Ongoing   GOAL:   Patient will meet greater than or equal to 90% of their needs - Progressing   MONITOR:   PO intake, Diet advancement, Supplement acceptance  REASON FOR ASSESSMENT:   Consult Assessment of nutrition requirement/status, Diet education, Calorie Count  ASSESSMENT:   53 y.o female who presented to [facility] for surgery with the diagnosis of gastric cancer. Now s/p partial distal gastrectomy. PMH of GERD, HTN, HLD, SBO, T2DM.  8/27 - Admitted, s/p Diagnostic laparoscopy, open distal partial gastrectomy (Billroth II?)  Pt up in chair with daughter in bedside, both in good spirits. Pt is determined to get better has support from both daughters at home. One daughter lives close by while the other is taking leave from work in [place] to help her mom through this time. They are both very supportive.   Pt reports since an SBO in 2021 she has dealt with continuous pain that ranges from 3-4 on a pain scale almost like a stabbing pain. Some days are worse than others, she did not state that certain foods caused this however she does report she has been scared to eat because she would experience nausea, vomiting, and diarrhea right after eating most times. Weight seems stable until 1 year ago where pt lost 13 lbs, 7% of her body weight, going from 180 lbs to 167 lbs. If current weight is  accurate she has lost 11 lbs, 6% in 3 months going from 178 lbs to 167 lbs. This is not clinically significant but is concerning given pt's current diagnosis and recent surgery. Pt endorses knowing she has been losing weight, on exam pt presents with mild muscle loss, no fat depletions noted.   Pt typically was eating 2 meals per day, breakfast and dinner, but did snack throughout the day on yogurt and fruit. Was not taking supplements prior to admission. Drinking regular ginger ale, Gatorade, water , and tea. They seem to be following a GI Soft diet at home. They were cautious about what foods would possibly cause obstructions tried to stay away from them.   Calorie count started yesterday and pt doing well, meeting 66% of her calorie count needs but only 49% of her protein needs. Pt tried Glucerna yesterday however they only had chocolate and she states chocolate foods PTA gave her issues. Willing to try Ensure High Protein.  RD provided High-Calorie High-Protein Nutrition Therapy handout from the Academy of [employer] and [employer]etics. Reviewed patient's dietary recall. Provided examples on ways to increase caloric density of foods and beverages frequently consumed by the patient. Also provided ideas to promote variety and to incorporate additional nutrient dense foods into patient's diet. Discussed eating small frequent meals and snacks to assist in increasing overall po intake.   RD provided Diet for Dumping Syndrome handout from the Academy of Nutrition and Dietetics. Encouraged pt to stay away from high high sugar content such as fruit juice, sugar sweetened  beverages, syrup, honey, ice cream, etc. Discussed with pt Ensure High Protein may cause dumping syndrome and she may have to try Ensure Max that is a lower sugar but lower calorie option.   Dietary recall: Breakfast: eggs with tomato, cheese onions, grits, toast, apple, greek yogurt Lunch: greek yogurt with apples Dinner: Protein with steamed  vegetables or takeout from [employer] Supplements: N/A Snacks: Fruit, yogurt Drinks: Gingerale, Regular Gatorade, water , tea   Disposition: Home on Wednesday?   Admit weight: 75.8 kg Current weight: 75.8 kg  09/2023 - 81 kg  02/2023 - 82 kg    Average Meal Intake: 8/31: 50% intake x 1 recorded meals 9/1: 75% x 2 recorded meals   Nutritionally Relevant Medications: Scheduled Meds:  feeding supplement  237 mL Oral BID BM   multivitamin with minerals  1 tablet Oral Daily   potassium chloride   30 mEq Oral BID   Labs Reviewed: CBG ranges from 113-144 mg/dL over the last 24 hours HgbA1c 5.8  NUTRITION - FOCUSED PHYSICAL EXAM:  Flowsheet Row Most Recent Value  Orbital Region No depletion  Upper Arm Region No depletion  Thoracic and Lumbar Region No depletion  Buccal Region No depletion  Temple Region No depletion  Clavicle Bone Region No depletion  Clavicle and Acromion Bone Region No depletion  Scapular Bone Region No depletion  Dorsal Hand No depletion  Patellar Region Mild depletion  Anterior Thigh Region Mild depletion  Posterior Calf Region Mild depletion  Edema (RD Assessment) None  Hair Reviewed  Eyes Reviewed  Mouth Reviewed  [Has partial dentures]  Skin Reviewed  Nails Reviewed    Diet Order:   Diet Order             DIET SOFT Room service appropriate? Yes; Fluid consistency: Thin  Diet effective now                   EDUCATION NEEDS:   Not appropriate for education at this time  Skin:  Skin Assessment: Skin Integrity Issues: Skin Integrity Issues:: Incisions Incisions: Closed surgical incision  Last BM:  01/17/2024  Height:   Ht Readings from Last 1 Encounters:  01/11/24 5' 3 (1.6 m)    Weight:   Wt Readings from Last 1 Encounters:  01/11/24 75.8 kg    Ideal Body Weight:  52.3 kg  BMI:  Body mass index is 29.58 kg/m.  Estimated Nutritional Needs:   Kcal:  1900-2100 kcal  Protein:  100-120 gm  Fluid:   >1.9L/day   Olivia Kenning, RD Registered Dietitian  See Amion for more information

## 2024-01-17 NOTE — TOC Initial Note (Addendum)
 Transition of Care (TOC) - Initial/Assessment Note   Spoke to patient at bedside. PAtient from home alone, daughter lives 10 minutes away  PT recommending HHPT and rolling walker. PAtient in agreement  Rolling walker ordered with [name] with [employer]   NCM called following agencies for HHPT : [facility], [facility], [facility], [facility], [facility], [facility] , [facility], [facility] all unable to accept referral.    [name] with [facility] accepted referral for HHPT.   Patient aware   Entered orders for MD to sign    Patient Details  Name: Susan Whitaker MRN: [id number] Date of Birth: 09/20/1970  Transition of Care [facility]) CM/SW Contact:    Stephane Powell Jansky, RN Phone Number: 01/17/2024, 1:44 PM  Clinical Narrative:                   Expected Discharge Plan: Home w Home Health Services Barriers to Discharge: Continued Medical Work up   Patient Goals and CMS Choice Patient states their goals for this hospitalization and ongoing recovery are:: to return to home CMS Medicare.gov Compare Post Acute Care list provided to:: Patient Choice offered to / list presented to : Patient      Expected Discharge Plan and Services   Discharge Planning Services: CM Consult Post Acute Care Choice: Home Health Living arrangements for the past 2 months: Single Family Home                 DME Arranged: Walker rolling DME Agency: [employer] Date DME Agency Contacted: 01/17/24 Time DME Agency Contacted: 1343 Representative spoke with at DME Agency: [place] HH Arranged: PT HH Agency: [facility] Date [facility] Agency Contacted: 01/17/24 Time HH Agency Contacted: 1343 Representative spoke with at [facility] Agency: [facility]  Prior Living Arrangements/Services Living arrangements for the past 2 months: Single Family Home Lives with:: Self Patient language and need for interpreter reviewed:: Yes Do you feel safe going back to the place where you live?: Yes      Need for Family  Participation in Patient Care: Yes (Comment) Care giver support system in place?: Yes (comment)   Criminal Activity/Legal Involvement Pertinent to Current Situation/Hospitalization: No - Comment as needed  Activities of Daily Living   ADL Screening (condition at time of admission) Independently performs ADLs?: Yes (appropriate for developmental age) Is the patient deaf or have difficulty hearing?: No Does the patient have difficulty seeing, even when wearing glasses/contacts?: No Does the patient have difficulty concentrating, remembering, or making decisions?: No  Permission Sought/Granted   Permission granted to share information with : Yes, Verbal Permission Granted     Permission granted to share info w AGENCY: home health agencies, [employer]        Emotional Assessment Appearance:: Appears stated age Attitude/Demeanor/Rapport: Engaged Affect (typically observed): Appropriate Orientation: : Oriented to Self, Oriented to Place, Oriented to  Time, Oriented to Situation Alcohol  / Substance Use: Not Applicable Psych Involvement: No (comment)  Admission diagnosis:  Cancer of antrum of stomach (HCC) [C16.3] Primary adenocarcinoma of pyloric antrum (HCC) [C16.3] Patient Active Problem List   Diagnosis Date Noted   Primary adenocarcinoma of pyloric antrum (HCC) 01/11/2024   Neuroendocrine neoplasm of stomach 12/26/2023   Gastric nodule 12/26/2023   Gastric mass 12/26/2023   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   Gastric adenocarcinoma (HCC) 11/30/2023   Right arm pain 09/14/2023   Chronic right-sided low back pain without sciatica 08/30/2023   FH: breast cancer in first degree relative 01/05/2021   Chronic abdominal pain 10/04/2020  S/P radiofrequency ablation operation for arrhythmia 10/03/20 10/04/2020   Chronic idiopathic constipation 09/14/2020   Obesity (BMI 30-39.9) 09/14/2020   Chronic narcotic use 09/14/2020   IUD (intrauterine device) in place 09/14/2020    Endometriosis of uterus 01/08/2020   History of small bowel obstruction 06/02/2019   Vitamin D  deficiency 05/26/2019   Anxiety state 07/14/2012   Primary hyperaldosteronism (HCC) 02/21/2012   Multiple thyroid  nodules 01/04/2012   Cervical pain (neck) 05/28/2011   Hemorrhoids 02/07/2009   Type 2 diabetes mellitus (HCC) 02/07/2009   ALLERGIC RHINITIS 05/24/2008   Hyperlipidemia associated with type 2 diabetes mellitus (HCC) 04/22/2008   URINARY INCONTINENCE 04/22/2008   Essential thrombocythemia (HCC) 04/22/2008   HTN (hypertension) 01/04/2007   GERD 01/04/2007   PCP:  Katheen Roselie Rockford, NP Pharmacy:   [facility] [place] [facility] [address], [address] [place] [address] Phone: [phone] Fax: [phone]     Social Drivers of Health (SDOH) Social History: SDOH Screenings   Food Insecurity: Food Insecurity Present (01/11/2024)  Housing: Low Risk  (01/11/2024)  Transportation Needs: No Transportation Needs (01/11/2024)  Utilities: At Risk (11/30/2023)  Depression (PHQ2-9): Low Risk  (11/30/2023)  Tobacco Use: Low Risk  (01/11/2024)   SDOH Interventions:     Readmission Risk Interventions     No data to display

## 2024-01-17 NOTE — Plan of Care (Signed)

## 2024-01-18 LAB — CREATININE, SERUM
Creatinine, Ser: 0.83 mg/dL (ref 0.44–1.00)
GFR, Estimated: >60 mL/min (ref >60)

## 2024-01-18 NOTE — Progress Notes (Addendum)
 Patient had emesis episode  shortly after  eating entire breakfast, pt reports it was undigested food.  Abdomen is soft, non-distebded and  active bowel sounds in all four quadrant. Drank all ensure.  Po zofran  given.

## 2024-01-18 NOTE — Plan of Care (Signed)
 Patient reports pain is well controlled with current pain regimen.  Antiemetics given twice this shift.  Only 1 emesis episode this am.  Bed placed in low/locked position.  Call bell within reach.  Problem: Education: Goal: Knowledge of General Education information will improve Description: Including pain rating scale, medication(s)/side effects and non-pharmacologic comfort measures Outcome: Progressing   Problem: Activity: Goal: Risk for activity intolerance will decrease Outcome: Progressing   Problem: Pain Managment: Goal: General experience of comfort will improve and/or be controlled Outcome: Progressing   Problem: Safety: Goal: Ability to remain free from injury will improve Outcome: Progressing

## 2024-01-18 NOTE — Progress Notes (Signed)
 7 Days Post-Op   Subjective/Chief Complaint: Had some more nausea today, but also was able to eat a bit more.     Objective: Vital signs in last 24 hours: Temp:  [98.1 F (36.7 C)-98.3 F (36.8 C)] 98.2 F (36.8 C) (09/03 0810) Pulse Rate:  [63-92] 72 (09/03 1600) Resp:  [16-18] 16 (09/03 1600) BP: (102-139)/(66-82) 102/66 (09/03 1600) SpO2:  [97 %-99 %] 97 % (09/03 1600) Last BM Date : 01/17/24  Intake/Output from previous day: 09/02 0701 - 09/03 0700 In: 118 [P.O.:118] Out: -  Intake/Output this shift: Total I/O In: 237 [P.O.:237] Out: -   A&o x 3 Abd soft, non distended, non tender.  dressing c/d/I. Removed.  No erythema or drainage from wound.   Ext warm, well perfused. No edema.    Lab Results:  Recent Labs    01/16/24 0722  WBC 6.6  HGB 12.3  HCT 37.5  PLT 398   BMET Recent Labs    01/16/24 0722 01/17/24 0630 01/18/24 0459  NA 141 143  --   K 3.7 3.9  --   CL 102 103  --   CO2 29 27  --   GLUCOSE 101* 103*  --   BUN 11 15  --   CREATININE 0.73 0.92 0.83  CALCIUM  9.5 9.4  --    PT/INR No results for input(s): LABPROT, INR in the last 72 hours.  ABG No results for input(s): PHART, HCO3 in the last 72 hours.  Invalid input(s): PCO2, PO2  Studies/Results: No results found.  Anti-infectives: Anti-infectives (From admission, onward)    Start     Dose/Rate Route Frequency Ordered Stop   01/11/24 2000  ceFAZolin  (ANCEF ) IVPB 2g/100 mL premix        2 g 200 mL/hr over 30 Minutes Intravenous Every 8 hours 01/11/24 1642 01/11/24 2035   01/11/24 0930  ceFAZolin  (ANCEF ) IVPB 2g/100 mL premix        2 g 200 mL/hr over 30 Minutes Intravenous On call to O.R. 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 Prn oxy,  dilaudid .   soft diet.  Hypokalemia - continue oral potassium supplements.   OOB/pulm toilet today.  Urinating without foley.  Changed FSG to BID.   HTN- norvasc  home med Continue PT.  Improving stability  Nutrition consult.  Calorie counts.  Dispo:  some nausea/vomiting today.  See how this shakes out today to evaluate if this represents a trend in the wrong direction or just eating something that didn't agree.     LOS: 7 days    Jina LITTIE Nephew, MD, FACS, FSSO Surgical Oncology, General Surgery, Trauma and Critical [facility] Surgery, [place] [phone] for weekday/non holidays Check [email or web address] for coverage night/weekend/holidays

## 2024-01-18 NOTE — Discharge Instructions (Signed)
 [facility]      [facility], [place] [phone]  ABDOMINAL SURGERY: POST OP INSTRUCTIONS  Always review your discharge instruction sheet given to you by the facility where your surgery was performed.  IF YOU HAVE DISABILITY OR FAMILY LEAVE FORMS, YOU MUST BRING THEM TO THE OFFICE FOR PROCESSING.  PLEASE DO NOT GIVE THEM TO YOUR DOCTOR.  A prescription for pain medication may be given to you upon discharge.  Take your pain medication as prescribed, if needed.  If narcotic pain medicine is not needed, then you may take acetaminophen (Tylenol) or ibuprofen (Advil) as needed. Take your usually prescribed medications unless otherwise directed. If you need a refill on your pain medication, please contact your pharmacy. They will contact our office to request authorization.  Prescriptions will not be filled after 5pm or on week-ends. You should follow a light diet the first few days after arrival home, such as soup and crackers, pudding, etc.unless your doctor has advised otherwise. A high-fiber, low fat diet can be resumed as tolerated.   Be sure to include lots of fluids daily. Most patients will experience some swelling and bruising on the chest and neck area.  Ice packs will help.  Swelling and bruising can take several days to resolve Most patients will experience some swelling and bruising in the area of the incision. Ice pack will help. Swelling and bruising can take several days to resolve..  It is common to experience some constipation if taking pain medication after surgery.  Increasing fluid intake and taking a stool softener will usually help or prevent this problem from occurring.  A mild laxative (Milk of Magnesia or Miralax) should be taken according to package directions if there are no bowel movements after 48 hours.  You may have steri-strips (small skin tapes) in place directly over the incision.  These strips should be left on the skin for 10-14 days.  If your surgeon used skin glue on  the incision, you may shower in 48 hours.  The glue will flake off over the next 2-3 weeks.  Any sutures or staples will be removed at the office during your follow-up visit. You may find that a light gauze bandage over your incision may keep your staples from being rubbed or pulled. You may shower and replace the bandage daily. ACTIVITIES:  You may resume regular (light) daily activities beginning the next day--such as daily self-care, walking, climbing stairs--gradually increasing activities as tolerated.  You may have sexual intercourse when it is comfortable.  Refrain from any heavy lifting or straining until approved by your doctor. You may drive when you no longer are taking prescription pain medication, you can comfortably wear a seatbelt, and you can safely maneuver your car and apply brakes Return to Work: __________8 weeks if applicable_________________________ Susan Whitaker should see your doctor in the office for a follow-up appointment approximately two weeks after your surgery.  Make sure that you call for this appointment within a day or two after you arrive home to insure a convenient appointment time. OTHER INSTRUCTIONS:  _____________________________________________________________ _____________________________________________________________  WHEN TO CALL YOUR DOCTOR: Fever over 101.0 Inability to urinate Nausea and/or vomiting Extreme swelling or bruising Continued bleeding from incision. Increased pain, redness, or drainage from the incision. Difficulty swallowing or breathing Muscle cramping or spasms. Numbness or tingling in hands or feet or around lips.  The clinic staff is available to answer your questions during regular business hours.  Please don't hesitate to call and ask to speak to  one of the nurses if you have concerns.  For further questions, please visit [email or web address]

## 2024-01-18 NOTE — Progress Notes (Signed)
 Calorie Count Note  Pt vomited this morning after eating 100% of a sausage patty and drinking some broth. Pt reports she started feeling nauseous last night and only had around 10% of her dinner. Pt was doing well with her calorie count on 9/1 and 9/2 until last night. After vomiting this morning pt did drink 100% of an Ensure which she tolerated. RN gave nausea medicine. Encouraged pt to drink an Ensure if she does not feel well enough to eat a meal also encouraged small frequent meals, which she seems to be doing.   48 hour calorie count ordered.  Diet: Soft Diet, thin liquids Supplements: Ensure High Protein   Day 1 9/1: Breakfast: 100% sausage, 25% eggs,50% greek yogurt, 100% 2 cranberry juice (440 kcal, 18 gm protein) Lunch: 50% braised beef with mashed potatoes and gravy, 100% 2 cranberry juice (360 kcal, 11 gm protein)  Dinner: 50% braised beef with mashed potatoes and gravy, 100% 2 cranberry juice (360 kcal, 11 gm protein)  Supplements: 50% Glucerna (110 kcal, 9 gm protein)   Total intake: 1270 kcal (66% of minimum estimated needs)  49 gm protein (49% of minimum estimated needs)   Day 2, 9/2: Breakfast: 100% sausage, 25% eggs,50% greek yogurt, 100% 2 cranberry juice (440 kcal, 18 gm protein) Lunch: 100% pot roast, 80% mashed potatoes and gravy, 100% 2 cranberry juice (520 kcal, 21 gm protein) Dinner: 10% pot roast and mashed potatoes, 100% 2 cranberry juices (200 kcal, 2 gm protein) Snack: 50% [place] yogurt (80 kcal, 11 gm protein) Supplements: 50% Ensure (175 kcal, 10 gm protein)  Total intake: 1415 kcal (74% of minimum estimated needs)  62 gm protein (62% of minimum estimated needs)   Estimated Nutritional Needs:  Kcal:  1900-2100 kcal Protein:  100-120 gm Fluid:  >1.9L/day  INTERVENTION:  Encourage PO intake - Currently on GI soft diet  Emphasis on protein intake Small frequent meals 48 hour calorie count  Ensure Plus High Protein po BID, each supplement provides 350  kcal and 20 grams of protein MVI with minerals daily  Discussed and Provided High calorie high protein handout,Diet for Dumping Syndrome handout, and Gastric Surgery Nutrition Therapy handout  Referral to Cancer Center Dietitian    NUTRITION DIAGNOSIS:    Increased nutrient needs related to post-op healing as evidenced by estimated needs. - Ongoing    GOAL:    Patient will meet greater than or equal to 90% of their needs - Progressing    Olivia Kenning, RD Registered Dietitian  See Amion for more information

## 2024-01-19 ENCOUNTER — Other Ambulatory Visit: Payer: Self-pay

## 2024-01-19 ENCOUNTER — Other Ambulatory Visit (HOSPITAL_BASED_OUTPATIENT_CLINIC_OR_DEPARTMENT_OTHER): Payer: Self-pay

## 2024-01-19 MED ORDER — ENSURE PLUS HIGH PROTEIN PO LIQD
1.0000 | Freq: Two times a day (BID) | ORAL | Status: AC
Start: 2024-01-19 — End: ?

## 2024-01-19 MED ORDER — ACETAMINOPHEN 500 MG PO TABS
1000.0000 mg | ORAL_TABLET | Freq: Three times a day (TID) | ORAL | 0 refills | Status: DC
  Filled 2024-01-19: qty 30, 5d supply, fill #0

## 2024-01-19 MED ORDER — OXYCODONE HCL 5 MG PO TABS
5.0000 mg | ORAL_TABLET | ORAL | 0 refills | Status: DC | PRN
  Filled 2024-01-19: qty 40, 4d supply, fill #0

## 2024-01-19 MED ORDER — METHOCARBAMOL 500 MG PO TABS
500.0000 mg | ORAL_TABLET | Freq: Four times a day (QID) | ORAL | 1 refills | Status: DC | PRN
  Filled 2024-01-19: qty 90, 23d supply, fill #0

## 2024-01-19 MED ORDER — POTASSIUM CHLORIDE CRYS ER 15 MEQ PO TBCR
30.0000 meq | EXTENDED_RELEASE_TABLET | Freq: Two times a day (BID) | ORAL | 0 refills | Status: DC
  Filled 2024-01-19: qty 120, 30d supply, fill #0

## 2024-01-19 MED ORDER — GABAPENTIN 100 MG PO CAPS
200.0000 mg | ORAL_CAPSULE | Freq: Three times a day (TID) | ORAL | 0 refills | Status: DC
  Filled 2024-01-19: qty 180, 30d supply, fill #0

## 2024-01-19 MED ORDER — PROCHLORPERAZINE MALEATE 10 MG PO TABS
10.0000 mg | ORAL_TABLET | Freq: Four times a day (QID) | ORAL | 0 refills | Status: DC | PRN
  Filled 2024-01-19: qty 30, 8d supply, fill #0

## 2024-01-19 MED ORDER — ONDANSETRON 4 MG PO TBDP
4.0000 mg | ORAL_TABLET | Freq: Four times a day (QID) | ORAL | 2 refills | Status: DC | PRN
  Filled 2024-01-19: qty 20, 5d supply, fill #0

## 2024-01-19 NOTE — Discharge Summary (Signed)
 Physician Discharge Summary  Patient ID: Susan Whitaker MRN: [id number] DOB/AGE: [birth year] 53 y.o.  Admit date: 01/11/2024 Discharge date: 01/19/2024  Admission Diagnoses: Adenocarcinoma of pyloric antrum DM type 2 Hyperlipidemia HTN GERD H/o chronic abdominal pain   Discharge Diagnoses:  Principal Problem:   Primary adenocarcinoma of pyloric antrum (HCC) And same as above S/p distal gastrectomy hypokalemia  Discharged Condition: stable  Hospital Course:  Patient went to the floor following a diagnostic laparoscopy and open distal gastrectomy.  She had a lot of pain on postop day 0, so she was switched from morphine  to Dilaudid  PCA.  She had not gotten any of her as needed Robaxin  so I asked the nurses to get that and renewed her IV Tylenol .  On postop day 2 she was still having a lot of pain, so I placed the Robaxin  standing schedule and DC the NG tube.  She required potassium repletion multiple days in a row.  I kept the PCA that day as she just started on clear liquids on postop day 2.  She was able to void with her Foley catheter removed on postop day 2.  She was switched over to as needed oxy and as needed Dilaudid  and taken off the PCA as her diet was advanced.  She started passing gas and had a bowel movement.  Her oral antihypertensives were restarted.  Potassium repletion was switched over to oral.  She was switched over to oral Robaxin  and oral Protonix  as well.  IV fluids were stopped.  She did well with ambulation but prior to the time used a standing walker to assist with mobility for stability.  Daughters were here for assistance and support.  Nutrition was consulted and did calorie counts and she did eat between 50 and 75% of her meals and drink started on as well. She had a few things that she ate that didn't agree with her and threw up, but she overall kept things down with minimal heartburn/belching.  She was able to go home on POD 7.    Consults: nutrition  Significant  Diagnostic Studies: labs:  K 3.9 Na 143 Cr 0.92 WBCs 6.6k HCT 37.5 prior to d/c  Treatments: surgery: see above  Discharge Exam: Blood pressure 116/64, pulse (!) 58, temperature 98 F (36.7 C), temperature source Oral, resp. rate 17, height 5' 3 (1.6 m), weight 75.8 kg, SpO2 97%. General appearance: alert, cooperative, and no distress Resp: breathing comfortably Cardio: regular rate and rhythm Extremities: extremities normal, atraumatic, no cyanosis or edema  Disposition: Discharge disposition: 01-Home or Self Care       Discharge Instructions     Call MD for:  difficulty breathing, headache or visual disturbances   Complete by: As directed    Call MD for:  hives   Complete by: As directed    Call MD for:  persistant nausea and vomiting   Complete by: As directed    Call MD for:  redness, tenderness, or signs of infection (pain, swelling, redness, odor or green/yellow discharge around incision site)   Complete by: As directed    Call MD for:  severe uncontrolled pain   Complete by: As directed    Call MD for:  temperature >100.4   Complete by: As directed    Discharge diet:   Complete by: As directed    Post gastrectomy   For home use only DME 4 wheeled rolling walker with seat   Complete by: As directed    Patient needs a  walker to treat with the following condition: Severe muscle deconditioning   Increase activity slowly   Complete by: As directed       Allergies as of 01/19/2024       Reactions   Lisinopril Cough        Medication List     STOP taking these medications    canagliflozin  300 MG Tabs tablet Commonly known as: Invokana    Mounjaro  7.5 MG/0.5ML Pen Generic drug: tirzepatide    naproxen  500 MG tablet Commonly known as: NAPROSYN        TAKE these medications    Accu-Chek Guide test strip Generic drug: glucose blood Use as instructed to check once daily.   OneTouch Ultra Test test strip Generic drug: glucose blood Use 1 each  daily to test blood glucose levels.   acetaminophen  325 MG tablet Commonly known as: TYLENOL  Take 2 tablets (650 mg total) by mouth every 4 (four) hours as needed for headache or mild pain. What changed: Another medication with the same name was added. Make sure you understand how and when to take each.   Acetaminophen  Extra Strength 500 MG Tabs Take 2 tablets (1,000 mg total) by mouth every 8 (eight) hours. What changed: You were already taking a medication with the same name, and this prescription was added. Make sure you understand how and when to take each.   amLODipine  10 MG tablet Commonly known as: NORVASC  Take 1 tablet (10 mg total) by mouth daily.   atorvastatin  80 MG tablet Commonly known as: LIPITOR Take 1 tablet (80 mg total) by mouth daily.   feeding supplement Liqd Take 237 mLs by mouth 2 (two) times daily between meals.   gabapentin  100 MG capsule Commonly known as: NEURONTIN  Take 2 capsules (200 mg total) by mouth 3 (three) times daily.   methocarbamol  500 MG tablet Commonly known as: ROBAXIN  Take 1 tablet (500 mg total) by mouth every 6 (six) hours as needed for muscle spasms.   Mirena  (52 MG) 20 MCG/DAY Iud Generic drug: levonorgestrel  Mirena  20 mcg/24 hours (7 yrs) 52 mg intrauterine device  Take by intrauterine route.   ondansetron  4 MG disintegrating tablet Commonly known as: ZOFRAN -ODT Dissolve 1 tablet (4 mg total) by mouth every 6 (six) hours as needed for nausea.   ONE TOUCH ULTRA 2 w/Device Kit Use in the morning, afternoon, and at bedtime.   OneTouch Delica Lancets Fine Misc Use to check blood sugar 3 times daily   oxyCODONE  5 MG immediate release tablet Commonly known as: Oxy IR/ROXICODONE  Take 1-2 tablets (5-10 mg total) by mouth every 4 (four) hours as needed for moderate pain (pain score 4-6).   pantoprazole  40 MG tablet Commonly known as: PROTONIX  Take 1 tablet (40 mg total) by mouth daily.   polyethylene glycol 17 g  packet Commonly known as: MIRALAX  / GLYCOLAX  Take 17 g by mouth daily.   potassium chloride  SA 15 MEQ tablet Commonly known as: KLOR-CON  M15 Take 2 tablets (30 mEq total) by mouth 2 (two) times daily.   prochlorperazine  10 MG tablet Commonly known as: COMPAZINE  Take 1 tablet (10 mg total) by mouth every 6 (six) hours as needed for nausea or vomiting (Use for nausea and / or vomiting unresolved with ondansetron  (Zofran ).).   ULTRAFLORA IMMUNE HEALTH PO Take 1 capsule by mouth daily.               Durable Medical Equipment  (From admission, onward)  Start     Ordered   01/19/24 0000  For home use only DME 4 wheeled rolling walker with seat       Question:  Patient needs a walker to treat with the following condition  Answer:  Severe muscle deconditioning   01/19/24 0859   01/17/24 1342  For home use only DME Walker rolling  Once       Question Answer Comment  Walker: With 5 Inch Wheels   Patient needs a walker to treat with the following condition Weakness      01/17/24 1342            Follow-up Information     Aron Shoulders, MD Follow up in 2 week(s).   Specialty: General Surgery Why: September 15 1:30 pm Contact information: [address] [place] [address] [phone]         Care, [facility] Follow up.   Contact information: [address] [place] [address] [phone]                 Signed: Shoulders Aron 01/19/2024, 3:14 PM

## 2024-01-19 NOTE — Plan of Care (Signed)

## 2024-01-19 NOTE — Progress Notes (Signed)
 DC order noted per MD. DC RN at bedside with patient/dtrs. Agreeable with discharge home/HH. AVS printed/reviewed. PIV removed. Skin and incisions with staples CDI, w/o s/s bleeding or infection. TOC meds delivered to bedside. No home meds. CP/edu resolved. All belongings accounted for. Patient wheeled downstairs for discharge via private auto. No telemonitor present on assessment.

## 2024-01-19 NOTE — Progress Notes (Signed)
 Physical Therapy Treatment Patient Details Name: Susan Whitaker MRN: [id number] DOB: 1970/05/23 Today's Date: 01/19/2024   History of Present Illness Pt is a 53 y/o female admitted 8/27 with dx of gastric CA, s/p diagnostic laparoscopy and open distal gastrectomy.  PMHx: DM2 GERD, HTN, abdominal adhesion sx    PT Comments  Pt seen for PT tx with pt agreeable, daughters present in room. Pt ambulated in hallway without AD with CGA, educated pt to continue using RW at home until more steady on her feet. Pt negotiated 4 steps without B rails with BUE support to descend, pt using step-to pattern. Pt is making good progress with mobility, anticipating d/c home today.    If plan is discharge home, recommend the following: A little help with walking and/or transfers;A little help with bathing/dressing/bathroom;Assistance with cooking/housework;Assist for transportation   Can travel by private vehicle        Equipment Recommendations  Rolling walker (2 wheels)    Recommendations for Other Services       Precautions / Restrictions Precautions Precautions: None Recall of Precautions/Restrictions: Intact Restrictions Weight Bearing Restrictions Per Provider Order: No     Mobility  Bed Mobility               General bed mobility comments: not tested, pt received & left sitting in recliner    Transfers Overall transfer level: Needs assistance Equipment used: None Transfers: Sit to/from Stand Sit to Stand: Supervision                Ambulation/Gait Ambulation/Gait assistance: Contact guard assist Gait Distance (Feet):  (>200 ft) Assistive device: None Gait Pattern/deviations: Decreased stride length, Step-through pattern Gait velocity: decreased     General Gait Details: decreased LUE reciprocal arm swing   Stairs Stairs: Yes Stairs assistance: Min assist Stair Management: No rails Number of Stairs: 4 (6) General stair comments: ascended 4 steps with step to  pattern with min assist without BUE support, descended stairs with BUE HHA with step to pattern   Wheelchair Mobility     Tilt Bed    Modified Rankin (Stroke Patients Only)       Balance Overall balance assessment: Needs assistance Sitting-balance support: No upper extremity supported, Feet supported Sitting balance-Leahy Scale: Fair     Standing balance support: During functional activity, No upper extremity supported Standing balance-Leahy Scale: Fair                              Hotel manager: No apparent difficulties  Cognition Arousal: Alert Behavior During Therapy: WFL for tasks assessed/performed   PT - Cognitive impairments: No apparent impairments                       PT - Cognition Comments: extremely appreciative of PT's efforts Following commands: Intact      Cueing Cueing Techniques: Verbal cues  Exercises      General Comments        Pertinent Vitals/Pain Pain Assessment Pain Assessment: 0-10 Pain Score: 5  Pain Location: abdomen Pain Descriptors / Indicators: Sore, Discomfort Pain Intervention(s): Monitored during session    Home Living                          Prior Function            PT Goals (current goals can now be found in the care  plan section) Acute Rehab PT Goals Patient Stated Goal: home as able. PT Goal Formulation: With patient Time For Goal Achievement: 01/28/24 Potential to Achieve Goals: Good Progress towards PT goals: Progressing toward goals    Frequency    Min 2X/week      PT Plan      Co-evaluation              AM-PAC PT 6 Clicks Mobility   Outcome Measure  Help needed turning from your back to your side while in a flat bed without using bedrails?: None Help needed moving from lying on your back to sitting on the side of a flat bed without using bedrails?: A Little Help needed moving to and from a bed to a chair (including a  wheelchair)?: A Little Help needed standing up from a chair using your arms (e.g., wheelchair or bedside chair)?: A Little Help needed to walk in hospital room?: A Little Help needed climbing 3-5 steps with a railing? : A Little 6 Click Score: 19    End of Session   Activity Tolerance: Patient tolerated treatment well Patient left: in chair;with call bell/phone within reach;with family/visitor present   PT Visit Diagnosis: Muscle weakness (generalized) (M62.81);Difficulty in walking, not elsewhere classified (R26.2);Other abnormalities of gait and mobility (R26.89)     Time: 9054-9044 PT Time Calculation (min) (ACUTE ONLY): 10 min  Charges:    $Therapeutic Activity: 8-22 mins PT General Charges $$ ACUTE PT VISIT: 1 Visit                     Susan Whitaker, PT, DPT 01/19/24, 10:01 AM   Susan Whitaker 01/19/2024, 10:00 AM

## 2024-01-19 NOTE — TOC Progression Note (Signed)
 Transition of Care (TOC) - Progression Note   [name] with [facility] aware discharge today. [name] with [employer] bringing walker to room.   In progression nurse asked if patient can get a shower chair. Per [name] patient's insurance does not cover shower chair. Nurse aware  Patient Details  Name: Susan Whitaker MRN: [id number] Date of Birth: 07/24/1970  Transition of Care [facility]) CM/SW Contact  Jodine Muchmore, Powell Jansky, RN Phone Number: 01/19/2024, 11:38 AM  Clinical Narrative:       Expected Discharge Plan: Home w Home Health Services Barriers to Discharge: Continued Medical Work up               Expected Discharge Plan and Services   Discharge Planning Services: CM Consult Post Acute Care Choice: Home Health Living arrangements for the past 2 months: Single Family Home Expected Discharge Date: 01/19/24               DME Arranged: Vannie rolling DME Agency: [employer] Date DME Agency Contacted: 01/17/24 Time DME Agency Contacted: 1343 Representative spoke with at DME Agency: [place] HH Arranged: PT HH Agency: [facility] Date [facility] Agency Contacted: 01/17/24 Time HH Agency Contacted: 1343 Representative spoke with at [facility] Agency: cheryl   Social Drivers of Health (SDOH) Interventions SDOH Screenings   Food Insecurity: Food Insecurity Present (01/11/2024)  Housing: Low Risk  (01/11/2024)  Transportation Needs: No Transportation Needs (01/11/2024)  Utilities: At Risk (11/30/2023)  Depression (PHQ2-9): Low Risk  (11/30/2023)  Tobacco Use: Low Risk  (01/11/2024)    Readmission Risk Interventions     No data to display

## 2024-01-20 ENCOUNTER — Other Ambulatory Visit (HOSPITAL_BASED_OUTPATIENT_CLINIC_OR_DEPARTMENT_OTHER): Payer: Self-pay

## 2024-01-20 ENCOUNTER — Telehealth: Payer: Self-pay

## 2024-01-20 ENCOUNTER — Other Ambulatory Visit (HOSPITAL_COMMUNITY): Payer: Self-pay

## 2024-01-20 NOTE — Transitions of Care (Post Inpatient/ED Visit) (Signed)
 01/20/2024  Name: Susan Whitaker MRN: [id number] DOB: September 13, 1970  Today's TOC FU Call Status: Today's TOC FU Call Status:: Successful TOC FU Call Completed TOC FU Call Complete Date: 01/20/24 Patient's Name and Date of Birth confirmed.  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 How have you been since you were released from the hospital?: Better  Items Reviewed: Did you receive and understand the discharge instructions provided?: Yes Medications obtained,verified, and reconciled?: Yes (Medications Reviewed) Any new allergies since your discharge?: No Dietary orders reviewed?: Yes Type of Diet Ordered:: As tolerated soft Do you have support at home?: Yes People in Home [RPT]: alone Name of Support/Comfort Primary Source: daughters [name] and Yasten  Medications Reviewed Today: Medications Reviewed Today     Reviewed by Eilleen Richerd GRADE, RN (Registered Nurse) on 01/20/24 at 1152  Med List Status: <None>   Medication Order Taking? Sig Documenting Provider Last Dose Status Informant  acetaminophen  (TYLENOL ) 325 MG tablet [id number]  Take 2 tablets (650 mg total) by mouth every 4 (four) hours as needed for headache or mild pain. Marylu Leita SAUNDERS, NP  Active Self  acetaminophen  (TYLENOL ) 500 MG tablet [id number] Yes Take 2 tablets (1,000 mg total) by mouth every 8 (eight) hours. Aron Shoulders, MD  Active   amLODipine  (NORVASC ) 10 MG tablet [id number]  Take 1 tablet (10 mg total) by mouth daily. Nche, Roselie Rockford, NP  Active Self  atorvastatin  (LIPITOR) 80 MG tablet [id number]  Take 1 tablet (80 mg total) by mouth daily. Thapa, [place], MD  Active Self  blood glucose meter kit and supplies [id number]  Use in the morning, afternoon, and at bedtime. Thapa, [place], MD  Active Self  feeding supplement  (ENSURE PLUS HIGH PROTEIN) LIQD [id number] Yes Take 237 mLs by mouth 2 (two) times daily between meals. Aron Shoulders, MD  Active   gabapentin  (NEURONTIN ) 100 MG capsule [id number] Yes Take 2 capsules (200 mg total) by mouth 3 (three) times daily. Aron Shoulders, MD  Active   glucose blood (ACCU-CHEK GUIDE) test strip [id number]  Use as instructed to check once daily. Von Pacific, MD  Active Self  Glucose Blood (BLOOD GLUCOSE TEST STRIPS) STRP [id number]  Use 1 each daily to test blood glucose levels. Thapa, [place], MD  Active Self  levonorgestrel  (MIRENA , 52 MG,) 20 MCG/DAY IUD [id number] Yes Mirena  20 mcg/24 hours (7 yrs) 52 mg intrauterine device  Take by intrauterine route. [provider]  Active Self           Med Note BENJAMIN, JOSETTE NOVAK   Thu Nov 17, 2023  9:13 AM) in  methocarbamol  (ROBAXIN ) 500 MG tablet [id number] Yes Take 1 tablet (500 mg total) by mouth every 6 (six) hours as needed for muscle spasms. Aron Shoulders, MD  Active   ondansetron  (ZOFRAN -ODT) 4 MG disintegrating tablet [id number] Yes Dissolve 1 tablet (4 mg total) by mouth every 6 (six) hours as needed for nausea. Aron Shoulders, MD  Active   [facility] LANCETS FINE [place] [id number]  Use to check blood sugar 3 times daily Von Pacific, MD  Active Self           Med Note MARISA, NATHANEL SAILOR   Tue Mar 21, 2018  3:13 PM)    oxyCODONE  (OXY IR/ROXICODONE ) 5 MG immediate release tablet [id number] Yes Take 1-2 tablets (5-10 mg total) by mouth every 4 (four)  hours as needed for moderate pain (pain score 4-6). Aron Shoulders, MD  Active   pantoprazole  (PROTONIX ) 40 MG tablet [id number] Yes Take 1 tablet (40 mg total) by mouth daily. Charlanne Groom, MD  Active Self  polyethylene glycol (MIRALAX  / GLYCOLAX ) 17 g packet [id number] Yes Take 17 g by mouth daily. [provider]  Active Self           Med Note SOILA, LYLE BROCKS   Thu Jan 05, 2024 10:45 AM)    potassium chloride  SA (KLOR-CON  M15) 15 MEQ tablet [id number]  Take 2 tablets (30  mEq total) by mouth 2 (two) times daily. Aron Shoulders, MD  Active            Med Note LESLY, RICHERD GRADE   Fri Jan 20, 2024 11:52 AM) 01/20/23 Not currently in stock at [facility] today likely available by Monday  Probiotic Product [facility] IMMUNE HEALTH PO) [id number] Yes Take 1 capsule by mouth daily. [provider]  Active Self  prochlorperazine  (COMPAZINE ) 10 MG tablet [id number] Yes Take 1 tablet (10 mg total) by mouth every 6 (six) hours as needed for nausea or vomiting (Use for nausea and / or vomiting unresolved with ondansetron  (Zofran ).). Aron Shoulders, MD  Active             Home Care and Equipment/Supplies: Were Home Health Services Ordered?: Yes Name of Home Health Agency:: [facility] Has Agency set up a time to come to your home?: No EMR reviewed for Home Health Orders: Orders present/patient has not received call (refer to CM for follow-up) (24 - 48 hours for [facility] noted) Any new equipment or medical supplies ordered?: Yes Name of Medical supply agency?: [employer] Were you able to get the equipment/medical supplies?: Yes Do you have any questions related to the use of the equipment/supplies?: No  Functional Questionnaire: Do you need assistance with bathing/showering or dressing?: No Do you need assistance with meal preparation?: Yes Do you need assistance with eating?: No Do you have difficulty maintaining continence: No Do you need assistance with getting out of bed/getting out of a chair/moving?: No Do you have difficulty managing or taking your medications?: No  Follow up appointments reviewed: PCP Follow-up appointment confirmed?: No (Care guide assisted in obtaining appointment for 01/30/24) MD Provider Line Number:[phone] Given: No Specialist Hospital Follow-up appointment confirmed?: Yes Date of Specialist follow-up appointment?: 01/30/24 Follow-Up Specialty Provider:: [facility] at 1:45 PM Do you need transportation to your follow-up  appointment?: No Do you understand care options if your condition(s) worsen?: Yes-patient verbalized understanding  SDOH Interventions Today    Flowsheet Row Most Recent Value  SDOH Interventions   Food Insecurity Interventions Intervention Not Indicated  Housing Interventions Intervention Not Indicated  Transportation Interventions Intervention Not Indicated  Utilities Interventions Intervention Not Indicated    Goals Addressed             This Visit's Progress    VBCI Transitions of Care (TOC) Care Plan       Problems:  Recent Hospitalization for treatment of Cancer of gastric mass; adenocarcinoma of pyloric antrum Medication access barrier Potassium Chloride  not available at [facility]  No PCP follow up post hospital appointment  Goal:  Over the next 30 days, the patient will not experience hospital readmission  Interventions:   Oncology: Assessment of understanding of oncology diagnosis:  Assessed patient understanding of cancer diagnosis and recommended treatment plan Reviewed upcoming provider appointments and treatment appointments Assessed available transportation to appointments and treatments.  Has consistent/reliable transportation: Yes PHQ2/PHQ9 performed  Patient Self Care Activities:  Attend all scheduled provider appointments Call pharmacy for medication refills 3-7 days in advance of running out of medications Call provider office for new concerns or questions  Notify RN Care Manager of TOC call rescheduling needs Participate in Transition of Care Program/Attend TOC scheduled calls Perform all self care activities independently  Take medications as prescribed    Plan:  The care management team will reach out to the patient again over the next 5- 10 business days. The [facility] team will follow up with the patient and will provide direct communication to the PCP for this patient.  The patient has been provided with contact information for the  care management team and has been advised to call with any health related questions or concerns.  PCP follow up appointment for 01/30/24 at 11:00 am arrive at 10:45 am left a voicemail message         Richerd Fish, RN, BSN, CCM [place]  [facility], [facility] RN Care Manager Direct Dial: [phone]

## 2024-01-20 NOTE — Patient Instructions (Addendum)
 Visit Information  Thank you for taking time to visit with me today. Please don't hesitate to contact me if I can be of assistance to you before our next scheduled telephone appointment.  Our next appointment is by telephone on 01/27/24 at 10:00 AM  Following is a copy of your care plan:   Goals Addressed             This Visit's Progress    VBCI Transitions of Care (TOC) Care Plan       Problems:  Recent Hospitalization for treatment of Cancer of gastric mass; adenocarcinoma of pyloric antrum Medication access barrier Potassium Chloride  not available at [facility]  No PCP follow up post hospital appointment  Goal:  Over the next 30 days, the patient will not experience hospital readmission  Interventions:   Oncology: Assessment of understanding of oncology diagnosis:  Assessed patient understanding of cancer diagnosis and recommended treatment plan Reviewed upcoming provider appointments and treatment appointments Assessed available transportation to appointments and treatments. Has consistent/reliable transportation: Yes PHQ2/PHQ9 performed  Patient Self Care Activities:  Attend all scheduled provider appointments Call pharmacy for medication refills 3-7 days in advance of running out of medications Call provider office for new concerns or questions  Notify RN Care Manager of TOC call rescheduling needs Participate in Transition of Care Program/Attend TOC scheduled calls Perform all self care activities independently  Take medications as prescribed    Plan:  The care management team will reach out to the patient again over the next 5- 10 business days. The [facility] team will follow up with the patient and will provide direct communication to the PCP for this patient.  The patient has been provided with contact information for the care management team and has been advised to call with any health related questions or concerns.  PCP follow up appointment for  01/30/24 at 11:00 am arrive at 10:45 am left a voicemail message        Patient verbalizes understanding of instructions and care plan provided today and agrees to view in MyChart. Active MyChart status and patient understanding of how to access instructions and care plan via MyChart confirmed with patient.     Telephone follow up appointment with care management team member scheduled for: The patient has been provided with contact information for the care management team and has been advised to call with any health related questions or concerns.  The care management team will reach out to the patient again over the next 5-7 business days.  Follow up with provider re: uncontrolled nausea vomiting, surgical site with increased swelling, pain, fever  Please call the care guide team at [phone] if you need to cancel or reschedule your appointment.   Please call the [place]  National Suicide Prevention Lifeline: (949) 436-9939 or TTY: (505)232-3619 TTY (760) 771-6465) to talk to a trained counselor call 1-800-273-TALK (toll free, 24 hour hotline) call 911 if you are experiencing a Mental Health or Behavioral Health Crisis or need someone to talk to.  Richerd Fish, RN, BSN, CCM [facility], [facility] RN Care Manager Direct Dial: [phone]

## 2024-01-23 ENCOUNTER — Ambulatory Visit: Payer: Self-pay | Admitting: Endocrinology

## 2024-01-23 ENCOUNTER — Ambulatory Visit (INDEPENDENT_AMBULATORY_CARE_PROVIDER_SITE_OTHER): Admitting: Endocrinology

## 2024-01-23 ENCOUNTER — Other Ambulatory Visit (HOSPITAL_BASED_OUTPATIENT_CLINIC_OR_DEPARTMENT_OTHER): Payer: Self-pay

## 2024-01-23 ENCOUNTER — Encounter: Payer: Self-pay | Admitting: Endocrinology

## 2024-01-23 VITALS — BP 126/82 | HR 90 | Resp 16 | Ht 63.0 in | Wt 166.6 lb

## 2024-01-23 DIAGNOSIS — E1169 Type 2 diabetes mellitus with other specified complication: Secondary | ICD-10-CM | POA: Diagnosis not present

## 2024-01-23 DIAGNOSIS — E669 Obesity, unspecified: Secondary | ICD-10-CM | POA: Diagnosis not present

## 2024-01-23 LAB — POCT GLYCOSYLATED HEMOGLOBIN (HGB A1C): Hemoglobin A1C: 5.7 % — AB (ref 4.0–5.6)

## 2024-01-23 MED ORDER — BLOOD GLUCOSE MONITOR SYSTEM W/DEVICE KIT
1.0000 | PACK | Freq: Three times a day (TID) | 0 refills | Status: DC
  Filled 2024-01-23: qty 1, 30d supply, fill #0

## 2024-01-23 MED ORDER — LANCET DEVICE MISC
1.0000 | Freq: Three times a day (TID) | 0 refills | Status: AC
  Filled 2024-01-23: qty 1, 30d supply, fill #0

## 2024-01-23 MED ORDER — BLOOD GLUCOSE TEST VI STRP
1.0000 | ORAL_STRIP | Freq: Three times a day (TID) | 3 refills | Status: AC
  Filled 2024-01-23: qty 100, 31d supply, fill #0

## 2024-01-23 MED ORDER — ACCU-CHEK SOFTCLIX LANCETS MISC
1.0000 | Freq: Three times a day (TID) | 3 refills | Status: AC
  Filled 2024-01-23: qty 100, 30d supply, fill #0

## 2024-01-23 NOTE — Patient Instructions (Signed)
 Fax or send glucose log in 2 weeks.

## 2024-01-23 NOTE — Progress Notes (Signed)
 Outpatient Endocrinology Note [place] Chelsea Pedretti, MD   Patient's Name: Susan Whitaker    DOB: [birth year]    MRN: [id number]                                                    REASON OF VISIT: Follow up for type 2 diabetes mellitus  PCP: Nche, Roselie Rockford, NP  HISTORY OF PRESENT ILLNESS:   Susan Whitaker is a 53 y.o. old female with past medical history listed below, is here for follow up for type 2 diabetes mellitus.   Pertinent Diabetes History: Patient was previously seen by Dr. Von and was last time seen in July 2024.  Patient was diagnosed with type 2 diabetes mellitus in 2013.  Patient was diagnosed with diabetes when she was having workup for fibroid surgery, baseline A1c was 6.9%.  She has controlled type 2 diabetes mellitus.  Chronic Diabetes Complications : Retinopathy: no. Last ophthalmology exam was done on 04/2023, following with ophthalmology regularly.  Nephropathy: no, on ACE/ARB /losartan . Peripheral neuropathy: no Coronary artery disease: no Stroke: no  Relevant comorbidities and cardiovascular risk factors: Obesity: yes Body mass index is 29.51 kg/m.  Hypertension: Yes  Hyperlipidemia : Yes, on statin   Current / Home Diabetic regimen includes:  Mounjaro  7.5 mg weekly.  Currently not taking. Invokana  300 mg daily.  Currently not taking.  Prior diabetic medications: Metformin  is stopped due to diarrhea.  She had taken glipizide  in the past.  Glycemic data:   She has not been checking blood sugar at home.  Blood sugar low 100 on recent hospitalization for multiple days.  Hypoglycemia: Patient has no hypoglycemic episodes. Patient has hypoglycemia awareness.  Factors modifying glucose control: 1.  Diabetic diet assessment: 3 meals a day.  2.  Staying active or exercising: Walking.  3.  Medication compliance: compliant all of the time.  Interval history  Patient was diagnosed with adenocarcinoma of stomach diagnosed in July 2025 status post distal  gastrectomy on August 27, recently discharged from hospital.  Patient has not been taking diabetic medication for more than a month after diagnosis of stomach cancer.  She had acceptable blood sugar when she was hospitalized.  She is currently not on any diabetic medication.  She has been watching her diet.  She has not restarted to monitor blood sugar at home after discharge from the recent hospitalization.  Hemoglobin A1c did 5.7%.  She had normal hemoglobin of 12.3 recently.  She is going to follow-up with oncology in the near future, possibly plan for further treatment.  Patient is accompanied by daughter in the clinic today.  REVIEW OF SYSTEMS As per history of present illness.   PAST MEDICAL HISTORY: Past Medical History:  Diagnosis Date   Abdominal bloating 09/14/2020   Abdominal pain 10/04/2020   Cancer (HCC) 2025   Gastric Cancer   Class 2 obesity due to excess calories with body mass index (BMI) of 35.0 to 35.9 in adult 06/2010   DM type 2 (diabetes mellitus, type 2) (HCC)    Dysrhythmia    SVT s/p ablation   Gastric ulcer    GERD (gastroesophageal reflux disease)    H/O constipation    H/O hemorrhoids    History of small bowel obstruction 06/02/2019   Hyperlipidemia    Hypertension    Hypokalemia 2019  Menorrhagia    Microalbuminuria 02/15/2012   Primary hyperaldosteronism (HCC) 02/21/2012   S/P radiofrequency ablation operation for arrhythmia 10/03/20 10/04/2020   SVT (supraventricular tachycardia) (HCC)    Noted 11/2011 admission   SVT (supraventricular tachycardia) (HCC) 12/29/2011   Thrombocytopenia (HCC)    Type 2 diabetes mellitus with obesity (HCC) 11/17/2020   IMO SNOMED Dx Update Oct 2024     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    PAST SURGICAL HISTORY: Past Surgical History:  Procedure Laterality Date   ABDOMINAL ADHESION SURGERY  06/03/2019   Dr Curvin   ABDOMINAL SURGERY     4 months of age-- unsure of type of surgery   COLONOSCOPY  04/2016    hemorrhoids--normal per pt with Dr Kristie   DILATATION & CURETTAGE/HYSTEROSCOPY WITH MYOSURE N/A 02/08/2020   Procedure: DILATATION & CURETTAGE/HYSTEROSCOPY WITH MYOSURE;  Surgeon: Gloriann Chick, MD;  Location: [facility];  Service: Gynecology;  Laterality: N/A;   ESOPHAGOGASTRODUODENOSCOPY N/A 12/26/2023   Procedure: EGD (ESOPHAGOGASTRODUODENOSCOPY);  Surgeon: Wilhelmenia Aloha Raddle., MD;  Location: [facility];  Service: Gastroenterology;  Laterality: N/A;   EUS N/A 12/26/2023   Procedure: ULTRASOUND, UPPER GI TRACT, ENDOSCOPIC;  Surgeon: Wilhelmenia Aloha Raddle., MD;  Location: [facility];  Service: Gastroenterology;  Laterality: N/A;   HEMORRHOID SURGERY  2005/2006   INTRAUTERINE DEVICE (IUD) INSERTION N/A 02/08/2020   Procedure: INTRAUTERINE DEVICE (IUD) INSERTION UNDER ULTRASOUND GUIDANCE;  Surgeon: Gloriann Chick, MD;  Location: [facility];  Service: Gynecology;  Laterality: N/A;  Mirena    LAPAROSCOPY N/A 01/11/2024   Procedure: LAPAROSCOPY, DIAGNOSTIC;  Surgeon: Aron Shoulders, MD;  Location: [facility];  Service: General;  Laterality: N/A;   LAPAROTOMY N/A 06/03/2019   Procedure: EXPLORATORY LAPAROTOMY WITH LYSIS OF ADHESIONS;  Surgeon: Curvin Deward MOULD, MD;  Location: [facility];  Service: General;  Laterality: N/A;   PARTIAL GASTRECTOMY N/A 01/11/2024   Procedure: GASTRECTOMY, PARTIAL;  Surgeon: Aron Shoulders, MD;  Location: [facility];  Service: General;  Laterality: N/A;  DISTAL GASTRECTOMY   SVT ABLATION N/A 10/03/2020   Procedure: SVT ABLATION;  Surgeon: Waddell Danelle ORN, MD;  Location: [facility];  Service: Cardiovascular;  Laterality: N/A;   UPPER GASTROINTESTINAL ENDOSCOPY     UTERINE FIBROID EMBOLIZATION  2010   at [facility]    ALLERGIES: Allergies  Allergen Reactions   Lisinopril Cough    FAMILY HISTORY:  Family History  Problem Relation Age of Onset   Cancer Mother 17       breast   Stroke Mother    Vision loss Father        some   Heart disease  Father        Rhythm disturbance   Hypertension Sister    Hypertension Brother    Hypertension Brother    Cancer Maternal Aunt 80       Breast   Cancer Maternal Grandmother 21       breast   Hypertension Maternal Grandmother    Cancer Other        breast, lung   Hypertension Other    Stroke Other    Diabetes Neg Hx    Colon cancer Neg Hx    Esophageal cancer Neg Hx    Stomach cancer Neg Hx    Rectal cancer Neg Hx     SOCIAL HISTORY: Social History   Socioeconomic History   Marital status: Single    Spouse name: Not on file   Number of children: 2   Years of education: Not  on file   Highest education level: Not on file  Occupational History   Occupation: YOUTH EMPLOYMENT    Employer: [employer]   Occupation: egineering  Tobacco Use   Smoking status: Never   Smokeless tobacco: Never  Vaping Use   Vaping status: Never Used  Substance and Sexual Activity   Alcohol  use: No   Drug use: No   Sexual activity: Yes    Partners: Male    Birth control/protection: I.U.D.  Other Topics Concern   Not on file  Social History Narrative   Works at [employer]         Social Drivers of Corporate investment banker Strain: Not on file  Food Insecurity: No Food Insecurity (01/20/2024)   Hunger Vital Sign    Worried About Running Out of Food in the Last Year: Never true    Ran Out of Food in the Last Year: Never true  Recent Concern: Food Insecurity - Food Insecurity Present (01/11/2024)   Hunger Vital Sign    Worried About Running Out of Food in the Last Year: Sometimes true    Ran Out of Food in the Last Year: Sometimes true  Transportation Needs: No Transportation Needs (01/20/2024)   PRAPARE - Administrator, Civil Service (Medical): No    Lack of Transportation (Non-Medical): No  Physical Activity: Not on file  Stress: Not on file  Social Connections: Not on file    MEDICATIONS:  Current Outpatient Medications  Medication Sig Dispense Refill    Accu-Chek Softclix Lancets lancets Use 1 each in the morning, at noon, and at bedtime to check blood glucose. 100 each 3   acetaminophen  (TYLENOL ) 325 MG tablet Take 2 tablets (650 mg total) by mouth every 4 (four) hours as needed for headache or mild pain.     acetaminophen  (TYLENOL ) 500 MG tablet Take 2 tablets (1,000 mg total) by mouth every 8 (eight) hours. 30 tablet 0   amLODipine  (NORVASC ) 10 MG tablet Take 1 tablet (10 mg total) by mouth daily. 90 tablet 3   atorvastatin  (LIPITOR) 80 MG tablet Take 1 tablet (80 mg total) by mouth daily. 90 tablet 3   blood glucose meter kit and supplies Use in the morning, afternoon, and at bedtime. 1 each 0   Blood Glucose Monitoring Suppl (BLOOD GLUCOSE MONITOR SYSTEM) w/Device KIT Use to check blood sugar 3 times daily. 1 kit 0   feeding supplement (ENSURE PLUS HIGH PROTEIN) LIQD Take 237 mLs by mouth 2 (two) times daily between meals.     gabapentin  (NEURONTIN ) 100 MG capsule Take 2 capsules (200 mg total) by mouth 3 (three) times daily. 180 capsule 0   glucose blood (ACCU-CHEK GUIDE) test strip Use as instructed to check once daily. 50 each 12   Glucose Blood (BLOOD GLUCOSE TEST STRIPS) STRP Use 1 each daily to test blood glucose levels. 100 each 3   Glucose Blood (BLOOD GLUCOSE TEST STRIPS) STRP Use 1 each at morning, noon, and bedtime to check blood glucose. 100 each 3   Lancet Device MISC Use to check blood glucose in the morning, at noon, and at bedtime. 1 each 0   levonorgestrel  (MIRENA , 52 MG,) 20 MCG/DAY IUD Mirena  20 mcg/24 hours (7 yrs) 52 mg intrauterine device  Take by intrauterine route.     methocarbamol  (ROBAXIN ) 500 MG tablet Take 1 tablet (500 mg total) by mouth every 6 (six) hours as needed for muscle spasms. 90 tablet 1   ondansetron  (  ZOFRAN -ODT) 4 MG disintegrating tablet Dissolve 1 tablet (4 mg total) by mouth every 6 (six) hours as needed for nausea. 20 tablet 2   ONETOUCH DELICA LANCETS FINE MISC Use to check blood sugar 3 times  daily 100 each 3   oxyCODONE  (OXY IR/ROXICODONE ) 5 MG immediate release tablet Take 1-2 tablets (5-10 mg total) by mouth every 4 (four) hours as needed for moderate pain (pain score 4-6). 40 tablet 0   pantoprazole  (PROTONIX ) 40 MG tablet Take 1 tablet (40 mg total) by mouth daily. 90 tablet 3   polyethylene glycol (MIRALAX  / GLYCOLAX ) 17 g packet Take 17 g by mouth daily.     potassium chloride  SA (KLOR-CON  M15) 15 MEQ tablet Take 2 tablets (30 mEq total) by mouth 2 (two) times daily. 120 tablet 0   Probiotic Product (ULTRAFLORA IMMUNE HEALTH PO) Take 1 capsule by mouth daily.     prochlorperazine  (COMPAZINE ) 10 MG tablet Take 1 tablet (10 mg total) by mouth every 6 (six) hours as needed for nausea or vomiting (Use for nausea and / or vomiting unresolved with ondansetron  (Zofran ).). 30 tablet 0   No current facility-administered medications for this visit.    PHYSICAL EXAM: Vitals:   01/23/24 1316  BP: 126/82  Pulse: 90  Resp: 16  SpO2: 97%  Weight: 166 lb 9.6 oz (75.6 kg)  Height: 5' 3 (1.6 m)   Body mass index is 29.51 kg/m.  Wt Readings from Last 3 Encounters:  01/23/24 166 lb 9.6 oz (75.6 kg)  01/11/24 167 lb (75.8 kg)  01/09/24 168 lb 6.4 oz (76.4 kg)    General: Well developed, well nourished female in no apparent distress.  HEENT: AT/, no external lesions.  Eyes: Conjunctiva clear and no icterus. Neck: Neck supple  Lungs: Respirations not labored Neurologic: Alert, oriented, normal speech Extremities / Skin: Dry.  Psychiatric: Does not appear depressed or anxious  Diabetic Foot Exam - Simple   No data filed    LABS Reviewed Lab Results  Component Value Date   HGBA1C 5.7 (A) 01/23/2024   HGBA1C 5.8 (H) 01/09/2024   HGBA1C 5.4 09/20/2023   Lab Results  Component Value Date   FRUCTOSAMINE 241 05/03/2018   FRUCTOSAMINE 273 08/01/2017   FRUCTOSAMINE 275 10/16/2015   Lab Results  Component Value Date   CHOL 156 11/17/2023   HDL 47.60 11/17/2023   LDLCALC  93 11/17/2023   LDLDIRECT 158.0 08/05/2022   TRIG 75.0 11/17/2023   CHOLHDL 3 11/17/2023   Lab Results  Component Value Date   MICRALBCREAT 16.4 09/14/2023   MICRALBCREAT 51.1 (H) 04/13/2013   Lab Results  Component Value Date   CREATININE 0.83 01/18/2024   Lab Results  Component Value Date   GFR 76.44 11/17/2023   GFR 76.44 11/17/2023   GFR 76.44 11/17/2023    ASSESSMENT / PLAN  1. Type 2 diabetes mellitus with obesity (HCC)     Diabetes Mellitus type 2, complicated by no known complications. - Diabetic status / severity: Controlled.  Lab Results  Component Value Date   HGBA1C 5.7 (A) 01/23/2024    - Hemoglobin A1c goal : <6.5%  Patient has newly diagnosed gastric carcinoma, status post surgery on August 27 recently discharged from hospital.  Patient has not been taking diabetic medication for more than a month.  Hemoglobin A1c today 5.7%.  She has normal hemoglobin.  - Medications:  - Continue to hold Mounjaro  and Invokana  that she was taking in the past. -Monitor without diabetic medication  at this time. -Advised patient to check blood sugar at least in the morning fasting and bedtime.  Goal fasting blood sugars would be in low 100s and bedtime blood sugars should be at least below 200. -Ask patient to fax or send messages of 2 weeks glucose data. -Patient has anticipated additional treatment/ ?  Chemotherapy for recently diagnosed gastric adenocarcinoma, will be seeing oncology in the near future.  If she be on any glucocorticoid she would need antidiabetic medication possibly insulin  therapy. .  - Home glucose testing: At least in the morning fasting and at bedtime.  - Discussed/ Gave Hypoglycemia treatment plan.  # Consult : not required at this time.   # Annual urine for microalbuminuria/ creatinine ratio, no microalbuminuria currently, continue ACE/ARB /losartan . Last  Lab Results  Component Value Date   MICRALBCREAT 16.4 09/14/2023    # Foot check  nightly.  # Annual dilated diabetic eye exams.   - Diet: Make healthy diabetic food choices - Life style / activity / exercise: Discussed.  2. Blood pressure  -  BP Readings from Last 1 Encounters:  01/23/24 126/82    - Control is in target.  - No change in current plans.  3. Lipid status / Hyperlipidemia - Last  Lab Results  Component Value Date   LDLCALC 93 11/17/2023   - Continue atorvastatin  40 mg daily.   Diagnoses and all orders for this visit:  Type 2 diabetes mellitus with obesity (HCC) -     POCT glycosylated hemoglobin (Hb A1C) -     Blood Glucose Monitoring Suppl (BLOOD GLUCOSE MONITOR SYSTEM) w/Device KIT; Use to check blood sugar 3 times daily. -     Glucose Blood (BLOOD GLUCOSE TEST STRIPS) STRP; Use 1 each at morning, noon, and bedtime to check blood glucose. -     Lancet Device MISC; Use to check blood glucose in the morning, at noon, and at bedtime. -     Accu-Chek Softclix Lancets lancets; Use 1 each in the morning, at noon, and at bedtime to check blood glucose.    DISPOSITION Follow up in clinic in 3 months suggested.    All questions answered and patient verbalized understanding of the plan.  [place]raq Mclane Arora, MD [facility] Endocrinology [facility] [address] [place], [address] [place], [place] [address] Phone # [phone]  At least part of this note was generated using voice recognition software. Inadvertent word errors may have occurred, which were not recognized during the proofreading process.

## 2024-01-24 ENCOUNTER — Other Ambulatory Visit (HOSPITAL_BASED_OUTPATIENT_CLINIC_OR_DEPARTMENT_OTHER): Payer: Self-pay

## 2024-01-25 ENCOUNTER — Other Ambulatory Visit: Payer: Self-pay | Admitting: *Deleted

## 2024-01-25 NOTE — Progress Notes (Signed)
 The proposed treatment discussed in conference is for discussion purpose only and is not a binding recommendation.  The patients have not been physically examined, or presented with their treatment options.  Therefore, final treatment plans cannot be decided.

## 2024-01-27 ENCOUNTER — Telehealth: Payer: Self-pay

## 2024-01-27 ENCOUNTER — Other Ambulatory Visit: Payer: Self-pay

## 2024-01-27 NOTE — Patient Instructions (Signed)
 Visit Information  Thank you for taking time to visit with me today. Please don't hesitate to contact me if I can be of assistance to you before our next scheduled telephone appointment.  Our next appointment is by telephone on 02/03/24 at 10:00 AM  Following is a copy of your care plan:   Goals Addressed             This Visit's Progress    [facility]BCI Transitions of Care (TOC) Care Plan   On track    Problems:  Recent Hospitalization for treatment of Cancer of gastric mass; adenocarcinoma of pyloric antrum Medication access barrier Potassium Chloride  not available at [facility] 9/12 Medication was received and started No PCP follow up post hospital appointment - 01/30/24 appointment made  Goal:  Over the next 30 days, the patient will not experience hospital readmission  Interventions:   Oncology: Assessment of understanding of oncology diagnosis:  Assessed patient understanding of cancer diagnosis and recommended treatment plan Reviewed upcoming provider appointments and treatment appointments Assessed available transportation to appointments and treatments. Has consistent/reliable transportation: Yes PHQ2/PHQ9 performed  Patient Self Care Activities:  Attend all scheduled provider appointments Call pharmacy for medication refills 3-7 days in advance of running out of medications Call provider office for new concerns or questions  Notify RN Care Manager of [facility] call rescheduling needs Participate in Transition of Care Program/Attend TOC scheduled calls Perform all self care activities independently  Take medications as prescribed   01/27/24 Patient awaiting clearance from surgeon on restarted diabetic medications, currently A1C 5.7, eating roght and monitoring carbohydrates and calories.  Blood sugars reported as stable and to follow up with Endocrinologist with blood sugar results  Plan:  The care management team will reach out to the patient again over the next 5- 10  business days. The [facility] team will follow up with the patient and will provide direct communication to the PCP for this patient.  The patient has been provided with contact information for the care management team and has been advised to call with any health related questions or concerns.  PCP follow up appointment for 01/30/24 at 11:00 am arrive at 10:45 am left a voicemail message Progressively increase exercise and tolerance, patient has connect with own PT for progression needs she says.        Patient verbalizes understanding of instructions and care plan provided today and agrees to view in MyChart. Active MyChart status and patient understanding of how to access instructions and care plan via MyChart confirmed with patient.     Telephone follow up appointment with care management team member scheduled for: The patient has been provided with contact information for the care management team and has been advised to call with any health related questions or concerns.  The care management team will reach out to the patient again over the next 5-10 business days.   Please call the care guide team at [phone] if you need to cancel or reschedule your appointment.   Please call the [place]  National Suicide Prevention Lifeline: 262-351-8293 or TTY: 2100567680 TTY [phone]) to talk to a trained counselor call 1-800-273-TALK (toll free, 24 hour hotline) call 911 if you are experiencing a Mental Health or Behavioral Health Crisis or need someone to talk to.  Richerd Fish, RN, BSN, CCM [facility], [facility] RN Care Manager Direct Dial: [phone]

## 2024-01-27 NOTE — Transitions of Care (Post Inpatient/ED Visit) (Signed)
 Transition of Care week 2  Visit Note  01/27/2024  Name: Susan Whitaker MRN: [id number]          DOB: February 12, 1971  Situation: Patient enrolled in [facility] 30-day program. Visit completed with patient by telephone. Daughter Susan Whitaker is listed for primary contact number but Susan Whitaker states to called patient at her own cell number [phone]  Background:   Initial Transition Care Management Follow-up Telephone Call    Past Medical History:  Diagnosis Date   Abdominal bloating 09/14/2020   Abdominal pain 10/04/2020   Cancer (HCC) 2025   Gastric Cancer   Class 2 obesity due to excess calories with body mass index (BMI) of 35.0 to 35.9 in adult 06/2010   DM type 2 (diabetes mellitus, type 2) (HCC)    Dysrhythmia    SVT s/p ablation   Gastric ulcer    GERD (gastroesophageal reflux disease)    H/O constipation    H/O hemorrhoids    History of small bowel obstruction 06/02/2019   Hyperlipidemia    Hypertension    Hypokalemia 2019   Menorrhagia    Microalbuminuria 02/15/2012   Primary hyperaldosteronism (HCC) 02/21/2012   S/P radiofrequency ablation operation for arrhythmia 10/03/20 10/04/2020   SVT (supraventricular tachycardia) (HCC)    Noted 11/2011 admission   SVT (supraventricular tachycardia) (HCC) 12/29/2011   Thrombocytopenia (HCC)    Type 2 diabetes mellitus with obesity (HCC) 11/17/2020   IMO SNOMED Dx Update Oct 2024     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    Assessment: Patient Reported Symptoms: Cognitive Cognitive Status: Alert and oriented to person, place, and time, Normal speech and language skills      Neurological Neurological Review of Symptoms: No symptoms reported    HEENT HEENT Symptoms Reported: No symptoms reported HEENT Management Strategies: Medication therapy, Medical device HEENT Self-Management Outcome: 3 (uncertain)    Cardiovascular Cardiovascular Symptoms Reported: No symptoms reported Does patient have uncontrolled Hypertension?: No (She feel it is  controlled) Cardiovascular Management Strategies: Medication therapy Weight: 167 lb (75.8 kg) Cardiovascular Self-Management Outcome: 3 (uncertain)  Respiratory Respiratory Symptoms Reported: No symptoms reported    Endocrine Endocrine Symptoms Reported: No symptoms reported Is patient diabetic?: Yes Is patient checking blood sugars at home?: Yes List most recent blood sugar readings, include date and time of day: 99-109 in the morning Endocrine Self-Management Outcome: 4 (good)  Gastrointestinal Gastrointestinal Symptoms Reported: Vomiting, Nausea, Change in appetite Additional Gastrointestinal Details: Especially with Ensure Gastrointestinal Management Strategies: Medication therapy, Nutrition support, Diet modification (Doing calorie counting and diabetic diet)    Genitourinary Genitourinary Symptoms Reported: No symptoms reported    Integumentary Integumentary Symptoms Reported: Wound Additional Integumentary Details: open to air Skin Management Strategies: Routine screening (Will have incision and wound check on 01/30/24) Skin Self-Management Outcome: 4 (good)  Musculoskeletal Musculoskelatal Symptoms Reviewed: Unsteady gait, Weakness Musculoskeletal Management Strategies: Medication therapy, Medical device Musculoskeletal Self-Management Outcome: 4 (good) (using walker [place] Falls in the past year?: No    Psychosocial Psychosocial Symptoms Reported: No symptoms reported         There were no vitals filed for this visit.  Medications Reviewed Today     Reviewed by Eilleen Richerd GRADE, RN (Registered Nurse) on 01/27/24 at 1037  Med List Status: <None>   Medication Order Taking? Sig Documenting Provider Last Dose Status Informant  Accu-Chek Softclix Lancets lancets [id number]  Use 1 each in the morning, at noon, and at bedtime to check  blood glucose. Thapa, [place], MD  Active   acetaminophen  (TYLENOL ) 325 MG tablet  [id number]  Take 2 tablets (650 mg total) by mouth every 4 (four) hours as needed for headache or mild pain. Marylu Leita SAUNDERS, NP  Active Self  acetaminophen  (TYLENOL ) 500 MG tablet [id number]  Take 2 tablets (1,000 mg total) by mouth every 8 (eight) hours. Aron Shoulders, MD  Active   amLODipine  (NORVASC ) 10 MG tablet [id number]  Take 1 tablet (10 mg total) by mouth daily. Nche, Roselie Rockford, NP  Active Self  atorvastatin  (LIPITOR) 80 MG tablet [id number]  Take 1 tablet (80 mg total) by mouth daily. Thapa, [place], MD  Active Self  blood glucose meter kit and supplies [id number]  Use in the morning, afternoon, and at bedtime. Thapa, [place], MD  Active Self  Blood Glucose Monitoring Suppl (BLOOD GLUCOSE MONITOR SYSTEM) w/Device KIT [id number]  Use to check blood sugar 3 times daily. Thapa, [place], MD  Active   feeding supplement (ENSURE PLUS HIGH PROTEIN) LIQD [id number]  Take 237 mLs by mouth 2 (two) times daily between meals. Aron Shoulders, MD  Active   gabapentin  (NEURONTIN ) 100 MG capsule [id number]  Take 2 capsules (200 mg total) by mouth 3 (three) times daily. Aron Shoulders, MD  Active   glucose blood (ACCU-CHEK GUIDE) test strip [id number]  Use as instructed to check once daily. Von Pacific, MD  Active Self  Glucose Blood (BLOOD GLUCOSE TEST STRIPS) STRP [id number]  Use 1 each daily to test blood glucose levels. Thapa, [place], MD  Active Self  Glucose Blood (BLOOD GLUCOSE TEST STRIPS) STRP [id number]  Use 1 each at morning, noon, and bedtime to check blood glucose. Thapa, [place], MD  Active   Lancet Device MISC [id number]  Use to check blood glucose in the morning, at noon, and at bedtime. Thapa, [place], MD  Active   levonorgestrel  (MIRENA , 52 MG,) 20 MCG/DAY IUD [id number]  Mirena  20 mcg/24 hours (7 yrs) 52 mg intrauterine device  Take by intrauterine route. [provider]  Active Self           Med Note BENJAMIN, JOSETTE NOVAK   Thu Nov 17, 2023  9:13 AM) in  methocarbamol  (ROBAXIN ) 500 MG tablet  [id number]  Take 1 tablet (500 mg total) by mouth every 6 (six) hours as needed for muscle spasms. Aron Shoulders, MD  Active   ondansetron  (ZOFRAN -ODT) 4 MG disintegrating tablet [id number]  Dissolve 1 tablet (4 mg total) by mouth every 6 (six) hours as needed for nausea. Aron Shoulders, MD  Active   [facility] LANCETS FINE [place] [id number]  Use to check blood sugar 3 times daily Von Pacific, MD  Active Self           Med Note MARISA, NATHANEL SAILOR   Tue Mar 21, 2018  3:13 PM)    oxyCODONE  (OXY IR/ROXICODONE ) 5 MG immediate release tablet [id number]  Take 1-2 tablets (5-10 mg total) by mouth every 4 (four) hours as needed for moderate pain (pain score 4-6). Aron Shoulders, MD  Active   pantoprazole  (PROTONIX ) 40 MG tablet [id number]  Take 1 tablet (40 mg total) by mouth daily. Charlanne Groom, MD  Active Self  polyethylene glycol (MIRALAX  / GLYCOLAX ) 17 g packet [id number]  Take 17 g by mouth daily. [provider]  Active Self           Med Note SOILA, LYLE BROCKS   Thu Jan 05, 2024 10:45 AM)    potassium chloride  SA (KLOR-CON  M15)  15 MEQ tablet [id number]  Take 2 tablets (30 mEq total) by mouth 2 (two) times daily. Aron Shoulders, MD  Active            Med Note LESLY, RICHERD GRADE   Fri Jan 20, 2024 11:52 AM) 01/20/23 Not currently in stock at [facility] today likely available by Monday  Probiotic Product [facility] IMMUNE HEALTH PO) [id number]  Take 1 capsule by mouth daily. [provider]  Active Self  prochlorperazine  (COMPAZINE ) 10 MG tablet [id number]  Take 1 tablet (10 mg total) by mouth every 6 (six) hours as needed for nausea or vomiting (Use for nausea and / or vomiting unresolved with ondansetron  (Zofran ).). Aron Shoulders, MD  Active             Recommendation:   Continue Current Plan of Care  Follow Up Plan:   Telephone follow-up in 1 week  RICHERD Fish, RN, BSN, CCM [facility], [facility] RN Care Manager Direct Dial:  [phone]

## 2024-01-30 ENCOUNTER — Other Ambulatory Visit: Payer: Self-pay | Admitting: General Surgery

## 2024-01-30 ENCOUNTER — Ambulatory Visit: Admitting: Nurse Practitioner

## 2024-01-30 ENCOUNTER — Encounter: Payer: Self-pay | Admitting: Nurse Practitioner

## 2024-01-30 VITALS — BP 128/76 | HR 76 | Temp 98.3°F | Ht 66.0 in | Wt 167.6 lb

## 2024-01-30 DIAGNOSIS — E1169 Type 2 diabetes mellitus with other specified complication: Secondary | ICD-10-CM | POA: Diagnosis not present

## 2024-01-30 DIAGNOSIS — C169 Malignant neoplasm of stomach, unspecified: Secondary | ICD-10-CM

## 2024-01-30 DIAGNOSIS — Z23 Encounter for immunization: Secondary | ICD-10-CM | POA: Diagnosis not present

## 2024-01-30 DIAGNOSIS — I10 Essential (primary) hypertension: Secondary | ICD-10-CM | POA: Diagnosis not present

## 2024-01-30 NOTE — Assessment & Plan Note (Signed)
 BP at goal with amlodipine  BP Readings from Last 3 Encounters:  01/30/24 128/76  01/23/24 126/82  01/19/24 116/64    Maintain med dose F/up in 3months

## 2024-01-30 NOTE — Progress Notes (Signed)
 Established Patient Visit  Patient: Susan Whitaker   DOB: [birth year]   53 y.o. Female  MRN: [id number] Visit Date: 01/30/2024  Subjective:    Chief Complaint  Patient presents with   Follow-up    Follow up recent hospitalization  Discuss Probiotic    HPI Gastric adenocarcinoma [facility]) S/p partial gastrectomy 01/11/2024 Has f/up appointment with oncology 02/07/2024 Able to tolerate regular food-eats 5x/day.Unable to tolerate ensure, boost, glucerna supplement.-reports nausea. Denies any constipation or diarrhea Pain is controlled with tylenol  and oxycodone  prn Reports generalized weakness, so use of walker. Has maintain walking around her home several time a day. Wt Readings from Last 3 Encounters:  01/30/24 167 lb 9.6 oz (76 kg)  01/27/24 167 lb (75.8 kg)  01/23/24 166 lb 9.6 oz (75.6 kg)     HTN (hypertension) BP at goal with amlodipine  BP Readings from Last 3 Encounters:  01/30/24 128/76  01/23/24 126/82  01/19/24 116/64    Maintain med dose F/up in 3months  Type 2 diabetes mellitus (HCC) Invokanna and mounjaro  discontinued due to recent gastric cancer diagnosis and s/p partial gastrectomy. Recent hgbA1c at 5.7% Under the care of endocrinology Reports difficulty with food intake  Pain is controlled with tylenol  and oxycodone , No constipation.  Wt Readings from Last 3 Encounters:  01/30/24 167 lb 9.6 oz (76 kg)  01/27/24 167 lb (75.8 kg)  01/23/24 166 lb 9.6 oz (75.6 kg)    Reviewed medical, surgical, and social history today  Medications: Outpatient Medications Prior to Visit  Medication Sig   Accu-Chek Softclix Lancets lancets Use 1 each in the morning, at noon, and at bedtime to check blood glucose.   acetaminophen  (TYLENOL ) 325 MG tablet Take 2 tablets (650 mg total) by mouth every 4 (four) hours as needed for headache or mild pain.   acetaminophen  (TYLENOL ) 500 MG tablet Take 2 tablets (1,000 mg total) by mouth every 8 (eight) hours.    amLODipine  (NORVASC ) 10 MG tablet Take 1 tablet (10 mg total) by mouth daily.   atorvastatin  (LIPITOR) 80 MG tablet Take 1 tablet (80 mg total) by mouth daily.   blood glucose meter kit and supplies Use in the morning, afternoon, and at bedtime.   Blood Glucose Monitoring Suppl (BLOOD GLUCOSE MONITOR SYSTEM) w/Device KIT Use to check blood sugar 3 times daily.   feeding supplement (ENSURE PLUS HIGH PROTEIN) LIQD Take 237 mLs by mouth 2 (two) times daily between meals.   gabapentin  (NEURONTIN ) 100 MG capsule Take 2 capsules (200 mg total) by mouth 3 (three) times daily.   glucose blood (ACCU-CHEK GUIDE) test strip Use as instructed to check once daily.   Glucose Blood (BLOOD GLUCOSE TEST STRIPS) STRP Use 1 each daily to test blood glucose levels.   Glucose Blood (BLOOD GLUCOSE TEST STRIPS) STRP Use 1 each at morning, noon, and bedtime to check blood glucose.   Lancet Device MISC Use to check blood glucose in the morning, at noon, and at bedtime.   levonorgestrel  (MIRENA , 52 MG,) 20 MCG/DAY IUD Mirena  20 mcg/24 hours (7 yrs) 52 mg intrauterine device  Take by intrauterine route.   methocarbamol  (ROBAXIN ) 500 MG tablet Take 1 tablet (500 mg total) by mouth every 6 (six) hours as needed for muscle spasms.   ondansetron  (ZOFRAN -ODT) 4 MG disintegrating tablet Dissolve 1 tablet (4 mg total) by mouth every 6 (six) hours as needed for nausea.   ONETOUCH DELICA LANCETS  FINE MISC Use to check blood sugar 3 times daily   oxyCODONE  (OXY IR/ROXICODONE ) 5 MG immediate release tablet Take 1-2 tablets (5-10 mg total) by mouth every 4 (four) hours as needed for moderate pain (pain score 4-6).   pantoprazole  (PROTONIX ) 40 MG tablet Take 1 tablet (40 mg total) by mouth daily.   polyethylene glycol (MIRALAX  / GLYCOLAX ) 17 g packet Take 17 g by mouth daily.   potassium chloride  SA (KLOR-CON  M15) 15 MEQ tablet Take 2 tablets (30 mEq total) by mouth 2 (two) times daily.   prochlorperazine  (COMPAZINE ) 10 MG tablet Take 1  tablet (10 mg total) by mouth every 6 (six) hours as needed for nausea or vomiting (Use for nausea and / or vomiting unresolved with ondansetron  (Zofran ).).   Probiotic Product (ULTRAFLORA IMMUNE HEALTH PO) Take 1 capsule by mouth daily.   No facility-administered medications prior to visit.   Reviewed past medical and social history.   ROS per HPI above      Objective:  BP 128/76 (BP Location: Left Arm, Patient Position: Sitting, Cuff Size: Normal)   Pulse 76   Temp 98.3 F (36.8 C) (Oral)   Ht 5' 6 (1.676 m)   Wt 167 lb 9.6 oz (76 kg)   LMP  (LMP Unknown)   SpO2 97%   BMI 27.05 kg/m      Physical Exam Vitals and nursing note reviewed.  Cardiovascular:     Rate and Rhythm: Normal rate and regular rhythm.     Pulses: Normal pulses.     Heart sounds: Normal heart sounds.  Pulmonary:     Effort: Pulmonary effort is normal.     Breath sounds: Normal breath sounds.  Abdominal:     General: Bowel sounds are normal. There is no distension.     Palpations: Abdomen is soft.  Musculoskeletal:     Right lower leg: No edema.     Left lower leg: No edema.  Neurological:     Mental Status: She is alert and oriented to person, place, and time.     No results found for any visits on 01/30/24.    Assessment & Plan:    Problem List Items Addressed This Visit     Gastric adenocarcinoma [facility])   S/p partial gastrectomy 01/11/2024 Has f/up appointment with oncology 02/07/2024 Able to tolerate regular food-eats 5x/day.Unable to tolerate ensure, boost, glucerna supplement.-reports nausea. Denies any constipation or diarrhea Pain is controlled with tylenol  and oxycodone  prn Reports generalized weakness, so use of walker. Has maintain walking around her home several time a day. Wt Readings from Last 3 Encounters:  01/30/24 167 lb 9.6 oz (76 kg)  01/27/24 167 lb (75.8 kg)  01/23/24 166 lb 9.6 oz (75.6 kg)         HTN (hypertension)   BP at goal with amlodipine  BP Readings from  Last 3 Encounters:  01/30/24 128/76  01/23/24 126/82  01/19/24 116/64    Maintain med dose F/up in 3months      Type 2 diabetes mellitus (HCC)   Invokanna and mounjaro  discontinued due to recent gastric cancer diagnosis and s/p partial gastrectomy. Recent hgbA1c at 5.7% Under the care of endocrinology      Other Visit Diagnoses       Need for influenza vaccination    -  Primary   Relevant Orders   Flu vaccine HIGH DOSE PF(Fluzone Trivalent) (Completed)      Return for maintain upcoming appt for CPE.     Roselie  Marko Skalski, NP

## 2024-01-30 NOTE — Assessment & Plan Note (Addendum)
 Invokanna and mounjaro  discontinued due to recent gastric cancer diagnosis and s/p partial gastrectomy. Recent hgbA1c at 5.7% Under the care of endocrinology

## 2024-01-30 NOTE — Patient Instructions (Signed)
 Maintain upcoming appointment for physical

## 2024-01-30 NOTE — Assessment & Plan Note (Addendum)
 S/p partial gastrectomy 01/11/2024 Has f/up appointment with oncology 02/07/2024 Able to tolerate regular food-eats 5x/day.Unable to tolerate ensure, boost, glucerna supplement.-reports nausea. Denies any constipation or diarrhea Pain is controlled with tylenol  and oxycodone  prn Reports generalized weakness, so use of walker. Has maintain walking around her home several time a day. Wt Readings from Last 3 Encounters:  01/30/24 167 lb 9.6 oz (76 kg)  01/27/24 167 lb (75.8 kg)  01/23/24 166 lb 9.6 oz (75.6 kg)

## 2024-02-03 ENCOUNTER — Telehealth: Payer: Self-pay

## 2024-02-03 ENCOUNTER — Other Ambulatory Visit: Payer: Self-pay

## 2024-02-03 NOTE — Transitions of Care (Post Inpatient/ED Visit) (Signed)
 Transition of Care week 3  Visit Note  02/03/2024  Name: Susan Whitaker MRN: [id number]          DOB: [birth year]  Situation: Patient enrolled in [facility] 30-day program. Visit completed with patient by telephone.   Background:   Initial Transition Care Management Follow-up Telephone Call    Past Medical History:  Diagnosis Date   Abdominal bloating 09/14/2020   Abdominal pain 10/04/2020   Allergy     Cancer (HCC) 2025   Gastric Cancer   Class 2 obesity due to excess calories with body mass index (BMI) of 35.0 to 35.9 in adult 06/2010   DM type 2 (diabetes mellitus, type 2) (HCC)    Dysrhythmia    SVT s/p ablation   Gastric ulcer    GERD (gastroesophageal reflux disease)    H/O constipation    H/O hemorrhoids    History of small bowel obstruction 06/02/2019   Hyperlipidemia    Hypertension    Hypokalemia 2019   Menorrhagia    Microalbuminuria 02/15/2012   Primary hyperaldosteronism (HCC) 02/21/2012   S/P radiofrequency ablation operation for arrhythmia 10/03/20 10/04/2020   SVT (supraventricular tachycardia) (HCC)    Noted 11/2011 admission   SVT (supraventricular tachycardia) (HCC) 12/29/2011   Thrombocytopenia (HCC)    Type 2 diabetes mellitus with obesity (HCC) 11/17/2020   IMO SNOMED Dx Update Oct 2024     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    Assessment: Patient Reported Symptoms: Cognitive Cognitive Status: Able to follow simple commands, Alert and oriented to person, place, and time, Normal speech and language skills      Neurological Neurological Review of Symptoms: No symptoms reported Neurological Self-Management Outcome: 4 (good)  HEENT HEENT Symptoms Reported: No symptoms reported      Cardiovascular Cardiovascular Symptoms Reported: No symptoms reported Does patient have uncontrolled Hypertension?: No Cardiovascular Management Strategies: Medication therapy Weight: 167 lb (75.8 kg) (at MD office 4 days ago she states) Cardiovascular Self-Management Outcome: 4  (good)  Respiratory Respiratory Symptoms Reported: No symptoms reported    Endocrine Endocrine Symptoms Reported: No symptoms reported (I feel that I am doing good my highest was 160 after a meal) Is patient diabetic?: Yes Is patient checking blood sugars at home?: Yes List most recent blood sugar readings, include date and time of day: 89-98 in the mornings Endocrine Self-Management Outcome: 4 (good) Endocrine Comment: all of my diabetic medications are on a pause but I am controlling my blood sugar with diet and calorie counting and maintaining my weight  Gastrointestinal Gastrointestinal Symptoms Reported: Change in appetite, Nausea (Nausea with Ensure and no longer taking as it causes vomiting) Gastrointestinal Management Strategies: Adequate rest, Diet modification, Medication therapy, Nutrition support    Genitourinary Genitourinary Symptoms Reported: No symptoms reported    Integumentary Integumentary Symptoms Reported: Wound, Incision Additional Integumentary Details: removed staples 01/30/24 Skin Management Strategies: Routine screening Skin Self-Management Outcome: 4 (good)  Musculoskeletal Musculoskelatal Symptoms Reviewed: Weakness, Unsteady gait Additional Musculoskeletal Details: walking with walker most of the time; walking in house without it Musculoskeletal Management Strategies: Adequate rest, Activity, Exercise, Medication therapy, Medical device, Routine screening Musculoskeletal Self-Management Outcome: 4 (good)      Psychosocial Psychosocial Symptoms Reported: No symptoms reported (I feel that I have super support and blessed to have the medical people working with me and caring for me by getting me the appointments/follow ups early. 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  Medications Reviewed Today     Reviewed by Eilleen Richerd GRADE, RN (Registered  Nurse) on 02/03/24 at 1008  Med List Status: <None>   Medication Order Taking? Sig Documenting Provider Last Dose Status Informant  Accu-Chek Softclix Lancets lancets [id number]  Use 1 each in the morning, at noon, and at bedtime to check blood glucose. Thapa, [place], MD  Active   acetaminophen  (TYLENOL ) 325 MG tablet [id number]  Take 2 tablets (650 mg total) by mouth every 4 (four) hours as needed for headache or mild pain. Marylu Leita SAUNDERS, NP  Active Self  acetaminophen  (TYLENOL ) 500 MG tablet [id number]  Take 2 tablets (1,000 mg total) by mouth every 8 (eight) hours. Aron Shoulders, MD  Active   amLODipine  (NORVASC ) 10 MG tablet [id number]  Take 1 tablet (10 mg total) by mouth daily. Nche, Roselie Rockford, NP  Active Self  atorvastatin  (LIPITOR) 80 MG tablet [id number]  Take 1 tablet (80 mg total) by mouth daily. Thapa, [place], MD  Active Self  blood glucose meter kit and supplies [id number]  Use in the morning, afternoon, and at bedtime. Thapa, [place], MD  Active Self  Blood Glucose Monitoring Suppl (BLOOD GLUCOSE MONITOR SYSTEM) w/Device KIT [id number]  Use to check blood sugar 3 times daily. Thapa, [place], MD  Active   feeding supplement (ENSURE PLUS HIGH PROTEIN) LIQD [id number]  Take 237 mLs by mouth 2 (two) times daily between meals. Aron Shoulders, MD  Active   gabapentin  (NEURONTIN ) 100 MG capsule [id number]  Take 2 capsules (200 mg total) by mouth 3 (three) times daily. Aron Shoulders, MD  Active   glucose blood (ACCU-CHEK GUIDE) test strip [id number]  Use as instructed to check once daily. Von [place], MD  Active Self  Glucose Blood (BLOOD GLUCOSE TEST STRIPS) STRP [id number]  Use 1 each daily to test blood glucose levels. Thapa, [place], MD  Active Self  Glucose Blood (BLOOD GLUCOSE TEST STRIPS) STRP [id number]  Use 1 each at morning, noon, and bedtime to check blood glucose. Thapa, [place], MD  Active   Lancet Device MISC [id number]  Use to check blood glucose in the morning, at noon, and at bedtime. Thapa,  [place], MD  Active   levonorgestrel  (MIRENA , 52 MG,) 20 MCG/DAY IUD [id number]  Mirena  20 mcg/24 hours (7 yrs) 52 mg intrauterine device  Take by intrauterine route. [provider]  Active Self           Med Note BENJAMIN, JOSETTE NOVAK   Thu Nov 17, 2023  9:13 AM) in  methocarbamol  (ROBAXIN ) 500 MG tablet [id number]  Take 1 tablet (500 mg total) by mouth every 6 (six) hours as needed for muscle spasms. Aron Shoulders, MD  Active   ondansetron  (ZOFRAN -ODT) 4 MG disintegrating tablet [id number]  Dissolve 1 tablet (4 mg total) by mouth every 6 (six) hours as needed for nausea. Aron Shoulders, MD  Active   [facility] LANCETS FINE [place] [id number]  Use to check blood sugar 3 times daily Von Pacific, MD  Active Self           Med Note MARISA, NATHANEL SAILOR   Tue Mar 21, 2018  3:13 PM)    oxyCODONE  (OXY IR/ROXICODONE ) 5 MG immediate release tablet [id number]  Take 1-2 tablets (5-10 mg total) by mouth every 4 (four) hours as needed for moderate pain (pain score 4-6). Aron Shoulders, MD  Active   pantoprazole  (PROTONIX ) 40 MG tablet [id number]  Take 1 tablet (40  mg total) by mouth daily. Charlanne Groom, MD  Active Self  polyethylene glycol (MIRALAX  / GLYCOLAX ) 17 g packet [id number]  Take 17 g by mouth daily. [provider]  Active Self           Med Note SOILA, LYLE BROCKS   Thu Jan 05, 2024 10:45 AM)    potassium chloride  SA (KLOR-CON  M15) 15 MEQ tablet [id number]  Take 2 tablets (30 mEq total) by mouth 2 (two) times daily. Aron Shoulders, MD  Active            Med Note LESLY, RICHERD GRADE   Fri Jan 20, 2024 11:52 AM) 01/20/23 Not currently in stock at [facility] today likely available by Monday  Probiotic Product [facility] IMMUNE HEALTH PO) [id number]  Take 1 capsule by mouth daily. [provider]  Active Self  prochlorperazine  (COMPAZINE ) 10 MG tablet [id number]  Take 1 tablet (10 mg total) by mouth every 6 (six) hours as needed for nausea or vomiting (Use for nausea and / or vomiting  unresolved with ondansetron  (Zofran ).). Aron Shoulders, MD  Active             Recommendation:   Continue Current Plan of Care  Follow Up Plan:   Telephone follow-up in 1 week  RICHERD Fish, RN, BSN, CCM [facility], [facility] RN Care Manager Direct Dial: [phone]

## 2024-02-03 NOTE — Patient Instructions (Addendum)
 Visit Information  Thank you for taking time to visit with me today. Please don't hesitate to contact me if I can be of assistance to you before our next scheduled telephone appointment.  Our next appointment is by telephone on 02/10/24 at 10:00 AM  Following is a copy of your care plan:   Goals Addressed             This Visit's Progress    VBCI Transitions of Care (TOC) Care Plan   On track    Problems:  Recent Hospitalization for treatment of Cancer of gastric mass; adenocarcinoma of pyloric antrum Medication access barrier Potassium Chloride  not available at [facility] 9/12 01/27/24 Medication was received and started No PCP follow up post hospital appointment - 01/30/24 appointment made 02/03/24 Ongoing follow with specialist 02/07/24 to start chemotherapy  Goal:  Over the next 30 days, the patient will not experience hospital readmission  Interventions:   Oncology: Assessment of understanding of oncology diagnosis:  Assessed patient understanding of cancer diagnosis and recommended treatment plan Reviewed upcoming provider appointments and treatment appointments Assessed available transportation to appointments and treatments. Has consistent/reliable transportation: Yes PHQ2/PHQ9 performed 02/07/24 appointment with Oncology team, labs, and dietician  Patient Self Care Activities:  Attend all scheduled provider appointments Call pharmacy for medication refills 3-7 days in advance of running out of medications Call provider office for new concerns or questions  Notify RN Care Manager of [facility] call rescheduling needs Participate in Transition of Care Program/Attend [facility] scheduled calls Perform all self care activities independently  Take medications as prescribed   01/27/24 Patient awaiting clearance from surgeon on restarted diabetic medications, currently A1C 5.7, eating right and monitoring carbohydrates and calories.  Blood sugars reported as stable and to follow up with  Endocrinologist with blood sugar results 02/03/24 FMLA until November due to starting chemotherapy to start Plan:  The care management team will reach out to the patient again over the next 5- 10 business days. The [facility] team will follow up with the patient and will provide direct communication to the PCP for this patient.  The patient has been provided with contact information for the care management team and has been advised to call with any health related questions or concerns.  PCP follow up appointment for 01/30/24 at 11:00 am arrive at 10:45 am left a voicemail message Progressively increase exercise and tolerance, patient has connect with own PT for progression needs she says. 02/03/24 Follow up in 1 week. Call your PCP or Oncologist with any change with increased abdominal pain, change in bowels,and  not controlled by current medications.        Patient verbalizes understanding of instructions and care plan provided today and agrees to view in MyChart. Active MyChart status and patient understanding of how to access instructions and care plan via MyChart confirmed with patient.     The patient has been provided with contact information for the care management team and has been advised to call with any health related questions or concerns.  The care management team will reach out to the patient again over the next 5- 10 business days.  The patient will call for symptoms not currently controlled* as advised to contact PCP or Oncologist.   Please call the care guide team at [phone] if you need to cancel or reschedule your appointment.   Please call the [place]  National Suicide Prevention Lifeline: 716-004-4978 or TTY: 9395546316 TTY [phone]) to talk to a trained counselor call 1-800-273-TALK (toll  free, 24 hour hotline) call 911 if you are experiencing a Mental Health or Behavioral Health Crisis or need someone to talk to.  Richerd Fish, RN, BSN, CCM [facility], [facility] RN Care Manager Direct Dial: [phone]

## 2024-02-06 ENCOUNTER — Other Ambulatory Visit: Payer: Self-pay

## 2024-02-06 DIAGNOSIS — C169 Malignant neoplasm of stomach, unspecified: Secondary | ICD-10-CM

## 2024-02-06 DIAGNOSIS — D75839 Thrombocytosis, unspecified: Secondary | ICD-10-CM

## 2024-02-06 NOTE — Telephone Encounter (Signed)
 Called pt and  she aware of  her appts.

## 2024-02-07 ENCOUNTER — Inpatient Hospital Stay: Admitting: Nutrition

## 2024-02-07 ENCOUNTER — Inpatient Hospital Stay: Attending: Hematology

## 2024-02-07 ENCOUNTER — Inpatient Hospital Stay: Admitting: Hematology

## 2024-02-07 DIAGNOSIS — C169 Malignant neoplasm of stomach, unspecified: Secondary | ICD-10-CM | POA: Diagnosis present

## 2024-02-07 DIAGNOSIS — D75839 Thrombocytosis, unspecified: Secondary | ICD-10-CM

## 2024-02-07 LAB — CMP (CANCER CENTER ONLY)
ALT: 35 U/L (ref 0–44)
AST: 19 U/L (ref 15–41)
Albumin: 4.2 g/dL (ref 3.5–5.0)
Alkaline Phosphatase: 62 U/L (ref 38–126)
Anion gap: 5 (ref 5–15)
BUN: 17 mg/dL (ref 6–20)
CO2: 32 mmol/L (ref 22–32)
Calcium: 9.2 mg/dL (ref 8.9–10.3)
Chloride: 105 mmol/L (ref 98–111)
Creatinine: 0.8 mg/dL (ref 0.44–1.00)
GFR, Estimated: >60 mL/min (ref >60)
Glucose, Bld: 101 mg/dL — ABNORMAL HIGH (ref 70–99)
Potassium: 3.5 mmol/L (ref 3.5–5.1)
Sodium: 142 mmol/L (ref 135–145)
Total Bilirubin: 0.5 mg/dL (ref 0.0–1.2)
Total Protein: 7.9 g/dL (ref 6.5–8.1)

## 2024-02-07 LAB — CBC WITH DIFFERENTIAL (CANCER CENTER ONLY)
Abs Immature Granulocytes: 0.01 K/uL (ref 0.00–0.07)
Basophils Absolute: 0 K/uL (ref 0.0–0.1)
Basophils Relative: 0 %
Eosinophils Absolute: 0.2 K/uL (ref 0.0–0.5)
Eosinophils Relative: 3 %
HCT: 39 % (ref 36.0–46.0)
Hemoglobin: 13.1 g/dL (ref 12.0–15.0)
Immature Granulocytes: 0 %
Lymphocytes Relative: 49 %
Lymphs Abs: 3.1 K/uL (ref 0.7–4.0)
MCH: 29.4 pg (ref 26.0–34.0)
MCHC: 33.6 g/dL (ref 30.0–36.0)
MCV: 87.6 fL (ref 80.0–100.0)
Monocytes Absolute: 0.6 K/uL (ref 0.1–1.0)
Monocytes Relative: 9 %
Neutro Abs: 2.5 K/uL (ref 1.7–7.7)
Neutrophils Relative %: 39 %
Platelet Count: 448 K/uL — ABNORMAL HIGH (ref 150–400)
RBC: 4.45 MIL/uL (ref 3.87–5.11)
RDW: 14.7 % (ref 11.5–15.5)
WBC Count: 6.3 K/uL (ref 4.0–10.5)
nRBC: 0 % (ref 0.0–0.2)

## 2024-02-07 NOTE — Progress Notes (Signed)
 53 year old female diagnosed with cancer of the antrum stomach status post distal partial gastrectomy in August, 2025.  Patient is followed by Dr. Lanny.  Past medical history includes small bowel obstruction, DM 2 in 2013, obesity, gastric ulcer, GERD, constipation, hyperlipidemia, hypertension, hyperaldosteronism  Medications include Zofran , Protonix , MiraLAX , potassium, probiotic, Compazine .  Labs include A1c 5.7%, glucose 103.  Height: 5 feet 3 inches. Weight: 167 pounds 9.6 ounces September 15 Patient weighed 185 pounds January 2025.  Patient reports she is eating small amounts of food throughout the day.  She wants to be sure she is eating the right things.  She would like to avoid dumping syndrome.  She is trying to be more active to build her strength.  Reports an intolerance to Glucerna during hospitalization.  She wonders if this was due to flavor given as she prefers vanilla flavor and was given chocolate.  Wants to be sure she is eating adequate calories and protein.  Verbalizes desire to maintain current weight.  Understands importance of minimizing weight loss throughout treatment.  Nutrition diagnosis: Food and nutrition related knowledge deficit related to stomach cancer as evidenced by no prior need for nutrition related information.  Intervention: Educated to continue small frequent meals and snacks with higher protein foods. Limit liquids at meals and snacks to 4 ounces. Reviewed slow diet advancement.   Provided nutrition information on diet after gastrectomy and dumping syndrome.  Reviewed this with patient and daughter during visit. Educated on importance of adequate protein for continued healing. Continue physical activity as allowed by MD. Provided samples of [employer] glucose control and Glucerna and vanilla flavor.  Educated to consume half carton at a time to determine tolerance. Questions answered.  Contact information given.  Monitoring, evaluation,  goals: Tolerate adequate calories and protein to promote weight maintenance and continued healing.  Next visit: Will follow-up with patient during upcoming treatment as needed.  **Disclaimer: This note was dictated with voice recognition software. Similar sounding words can inadvertently be transcribed and this note may contain transcription errors which may not have been corrected upon publication of note.**

## 2024-02-10 ENCOUNTER — Telehealth: Payer: Self-pay

## 2024-02-10 ENCOUNTER — Telehealth: Payer: Self-pay | Admitting: Pharmacist

## 2024-02-10 ENCOUNTER — Ambulatory Visit: Admitting: Hematology

## 2024-02-10 ENCOUNTER — Other Ambulatory Visit: Payer: Self-pay

## 2024-02-10 ENCOUNTER — Other Ambulatory Visit (HOSPITAL_COMMUNITY): Payer: Self-pay

## 2024-02-10 ENCOUNTER — Other Ambulatory Visit (HOSPITAL_BASED_OUTPATIENT_CLINIC_OR_DEPARTMENT_OTHER): Payer: Self-pay

## 2024-02-10 ENCOUNTER — Inpatient Hospital Stay: Admitting: Hematology

## 2024-02-10 VITALS — BP 130/76 | HR 76 | Temp 98.0°F | Resp 16 | Ht 66.0 in | Wt 167.4 lb

## 2024-02-10 DIAGNOSIS — C169 Malignant neoplasm of stomach, unspecified: Secondary | ICD-10-CM

## 2024-02-10 MED ORDER — CAPECITABINE 500 MG PO TABS: 850.0000 mg/m2 | ORAL_TABLET | Freq: Two times a day (BID) | ORAL | 0 refills | Status: DC

## 2024-02-10 MED ORDER — ONDANSETRON HCL 8 MG PO TABS
8.0000 mg | ORAL_TABLET | Freq: Three times a day (TID) | ORAL | 1 refills | Status: DC | PRN
  Filled 2024-02-10: qty 30, 10d supply, fill #0

## 2024-02-10 MED ORDER — DEXAMETHASONE 4 MG PO TABS
8.0000 mg | ORAL_TABLET | Freq: Every day | ORAL | 1 refills | Status: DC
  Filled 2024-02-10: qty 30, 15d supply, fill #0

## 2024-02-10 MED ORDER — PROCHLORPERAZINE MALEATE 10 MG PO TABS
10.0000 mg | ORAL_TABLET | Freq: Four times a day (QID) | ORAL | 1 refills | Status: DC | PRN
  Filled 2024-02-10: qty 30, 8d supply, fill #0

## 2024-02-10 MED ORDER — LIDOCAINE-PRILOCAINE 2.5-2.5 % EX CREA
TOPICAL_CREAM | CUTANEOUS | 3 refills | Status: DC
  Filled 2024-02-10: qty 30, 30d supply, fill #0

## 2024-02-10 NOTE — Telephone Encounter (Signed)
 Oral Oncology Pharmacist Encounter  Received new prescription for Xeloda  (capecitabine ) for the treatment of stage IB gastric cancer in conjunction with oxaliplatin, planned duration 8 cycles.  CBC w/ Diff and CMP from 02/07/24 assessed, no relevant lab abnormalities requiring baseline dose adjustment required at this time. Prescription dose and frequency assessed for appropriateness.  Current medication list in Epic reviewed, DDIs with Xeloda  identified: Category C DDI between Xeloda  and Pantoprazole  - proton-pump inhibitors can decrease efficacy of Xeloda  - will discuss with patient alternatives to pantoprazole , such as H2RA's like famotidine while on Xeloda . Category C DDI between Xeloda  and Ondansetron  due to risk of Qtc prolongation with fluorouracil products. Noted patient only taking PRN and PO route, risk higher with IV administration. No change in therapy warranted at this time.   Evaluated chart and no patient barriers to medication adherence noted.   Patient agreement for treatment documented in MD note on 02/10/24.  Patient's insurance requires Xeloda  to be filled through [employer] - prescription sent to their pharmacy on 02/10/24 for processing.   Oral Oncology Clinic will continue to follow for insurance authorization, copayment issues, initial counseling and start date.  Susan Whitaker, PharmD, BCPS, BCOP Hematology/Oncology Clinical Pharmacist [phone] 02/10/2024 2:27 PM

## 2024-02-10 NOTE — Progress Notes (Signed)
 [facility]   [phone] [phone]   Clinic Follow up Note   Patient Care Team: Nche, Roselie Rockford, NP as PCP - General (Internal Medicine) Waddell Danelle ORN, MD as PCP - Electrophysiology (Cardiology) Verlin Lonni BIRCH, MD as PCP - Cardiology (Cardiology) Prescilla Beams, MD as Consulting Physician (Nephrology) Waddell Danelle ORN, MD as Consulting Physician (Cardiology) Gloriann Chick, MD as Consulting Physician (Obstetrics and Gynecology) Camillo Golas, MD as Attending Physician (Ophthalmology) Eilleen Richerd GRADE, RN as [facility] Care Management  Date of Service:  02/10/2024  CHIEF COMPLAINT: f/u of gastric cancer  CURRENT THERAPY:  Pending adjuvant chemotherapy CapeOx  Oncology History   Gastric adenocarcinoma [facility]) pT1bN1M0 stage IB - Patient has chronic stomach issue after surgery for bowel obstruction in January 2021.  Routine screening EGD in July 2025 showed a 5 cm gastric polyps in distal stomach and a biopsy showed high-grade dysplasia and a small focus of adenocarcinoma in situ.  Staging CT scan was negative.  -Status post distal partial gastrostomy on January 11, 2024, which showed 1 cm invasive well to moderately differentiated adenocarcinoma, 1 out of 20 positive lymph node, margins are clear. -given the D2 node dissection, I recommend adjuvant chemotherapy CAPOX for 6 months  Assessment & Plan Gastric cancer, stage 1B Stage 1B gastric cancer with a 1 cm invasive tumor in the submucosa and one positive lymph node. Status post partial gastrectomy on January 11, 2024, with negative surgical margins. Tumor is moderately differentiated (grade 2). Risk of recurrence is approximately 30%. - Adjuvant chemotherapy recommended to prevent recurrence. - Discussed chemotherapy regimens: oxaliplatin and capecitabine  (oral) preferred for convenience and potential efficacy, despite higher doses and potential for fatigue, nausea, and appetite issues.  Less frequent office visits required. Oxaliplatin with 5-FU (intravenous) is less convenient but may have fewer side effects. - Schedule port placement on February 14, 2024. - Initiate chemotherapy with oxaliplatin and capecitabine  regimen. - Administer oxaliplatin intravenously every three weeks. - Prescribe capecitabine  orally twice daily for two weeks, followed by a one-week break, for eight cycles over six months. - Monitor for side effects: neuropathy, fatigue, nausea, cold sensitivity. - Advise avoiding cold foods and drinks to prevent cold sensitivity. - Prescribe ondansetron , prochlorperazine , and dexamethasone  for nausea as needed. - Schedule chemotherapy education class for her and her family. - Order CT scans every six months for the first two years, then annually for one to two additional years. - Perform circulating tumor DNA tests every three months. - Confirm with Dr. Cori regarding the need for radiation therapy. - Advise on infection prevention: wear a mask in public, avoid sick contacts. - Recommend flu and COVID vaccinations.   Plan - Surgical path reviewed, recommend adjuvant chemotherapy CapeOx for 6 months.  Also discussed alternative FOLFOX if she does not tolerate CapeOx - Plan to start chemotherapy in 1 to 2 weeks.  She has scheduled for port placement early next week - Follow-up with first cycle treatment  SUMMARY OF ONCOLOGIC HISTORY: Oncology History  Gastric adenocarcinoma (HCC)  11/30/2023 Initial Diagnosis   Gastric adenocarcinoma (HCC)   01/11/2024 Cancer Staging   Staging form: Stomach, AJCC 8th Edition - Pathologic stage from 01/11/2024: Stage IB (pT1b, pN1, cM0) - Signed by Lanny Callander, MD on 01/25/2024 Histologic grade (G): G2 Histologic grading system: 3 grade system Residual tumor (R): R0   02/16/2024 -  Chemotherapy   Patient is on Treatment Plan : GASTRIC CapeOx (1000/130) q21d x 8 Cycles  Discussed the use of AI scribe software for  clinical note transcription with the patient, who gave verbal consent to proceed.  History of Present Illness Susan Whitaker is a 53 year old female with gastric cancer who presents for follow-up after surgery.  She is recovering well post-surgery with improved independent ambulation and intermittent stomach pain that is gradually improving. Energy levels are at approximately 70% of her usual capacity. Appetite is improving with adherence to dietary advice, including small meals and avoiding fluids during meals.  The surgical procedure involved partial gastrectomy with removal of a one-centimeter tumor and a three-centimeter benign polyp. Pathology showed cancer in the submucosa but not in the muscular layer, with one out of twenty lymph nodes positive for cancer.  She is on a low dose of nausea medication, including Odinotrol and Compazine , with additional medications prescribed as needed. She is scheduled for port placement for chemotherapy administration. No significant weight loss post-surgery, and she maintains her weight.     All other systems were reviewed with the patient and are negative.  MEDICAL HISTORY:  Past Medical History:  Diagnosis Date   Abdominal bloating 09/14/2020   Abdominal pain 10/04/2020   Allergy     Cancer (HCC) 2025   Gastric Cancer   Class 2 obesity due to excess calories with body mass index (BMI) of 35.0 to 35.9 in adult 06/2010   DM type 2 (diabetes mellitus, type 2) (HCC)    Dysrhythmia    SVT s/p ablation   Gastric ulcer    GERD (gastroesophageal reflux disease)    H/O constipation    H/O hemorrhoids    History of small bowel obstruction 06/02/2019   Hyperlipidemia    Hypertension    Hypokalemia 2019   Menorrhagia    Microalbuminuria 02/15/2012   Primary hyperaldosteronism 02/21/2012   S/P radiofrequency ablation operation for arrhythmia 10/03/20 10/04/2020   SVT (supraventricular tachycardia)    Noted 11/2011 admission   SVT (supraventricular  tachycardia) 12/29/2011   Thrombocytopenia    Type 2 diabetes mellitus with obesity 11/17/2020   IMO SNOMED Dx Update Oct 2024     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    SURGICAL HISTORY: Past Surgical History:  Procedure Laterality Date   ABDOMINAL ADHESION SURGERY  06/03/2019   Dr Curvin   ABDOMINAL SURGERY     24 months of age-- unsure of type of surgery   COLONOSCOPY  04/2016   hemorrhoids--normal per pt with Dr Kristie   DILATATION & CURETTAGE/HYSTEROSCOPY WITH MYOSURE N/A 02/08/2020   Procedure: DILATATION & CURETTAGE/HYSTEROSCOPY WITH MYOSURE;  Surgeon: Gloriann Chick, MD;  Location: [facility];  Service: Gynecology;  Laterality: N/A;   ESOPHAGOGASTRODUODENOSCOPY N/A 12/26/2023   Procedure: EGD (ESOPHAGOGASTRODUODENOSCOPY);  Surgeon: Wilhelmenia Aloha Raddle., MD;  Location: [facility];  Service: Gastroenterology;  Laterality: N/A;   EUS N/A 12/26/2023   Procedure: ULTRASOUND, UPPER GI TRACT, ENDOSCOPIC;  Surgeon: Wilhelmenia Aloha Raddle., MD;  Location: [facility];  Service: Gastroenterology;  Laterality: N/A;   HEMORRHOID SURGERY  2005/2006   INTRAUTERINE DEVICE (IUD) INSERTION N/A 02/08/2020   Procedure: INTRAUTERINE DEVICE (IUD) INSERTION UNDER ULTRASOUND GUIDANCE;  Surgeon: Gloriann Chick, MD;  Location: [facility];  Service: Gynecology;  Laterality: N/A;  Mirena    LAPAROSCOPY N/A 01/11/2024   Procedure: LAPAROSCOPY, DIAGNOSTIC;  Surgeon: Aron Shoulders, MD;  Location: [facility];  Service: General;  Laterality: N/A;   LAPAROTOMY N/A 06/03/2019   Procedure: EXPLORATORY LAPAROTOMY WITH LYSIS OF ADHESIONS;  Surgeon: Curvin Deward MOULD, MD;  Location: [facility];  Service: General;  Laterality: N/A;   PARTIAL GASTRECTOMY N/A 01/11/2024   Procedure: GASTRECTOMY, PARTIAL;  Surgeon: Aron Shoulders, MD;  Location: [facility];  Service: General;  Laterality: N/A;  DISTAL GASTRECTOMY   SVT ABLATION N/A 10/03/2020   Procedure: SVT ABLATION;  Surgeon: Waddell Danelle ORN, MD;  Location: [facility];  Service: Cardiovascular;  Laterality: N/A;   UPPER GASTROINTESTINAL ENDOSCOPY     UTERINE FIBROID EMBOLIZATION  2010   at [facility]    I have reviewed the social history and family history with the patient and they are unchanged from previous note.  ALLERGIES:  is allergic to lisinopril.  MEDICATIONS:  Current Outpatient Medications  Medication Sig Dispense Refill   Accu-Chek Softclix Lancets lancets Use 1 each in the morning, at noon, and at bedtime to check blood glucose. 100 each 3   acetaminophen  (TYLENOL ) 325 MG tablet Take 2 tablets (650 mg total) by mouth every 4 (four) hours as needed for headache or mild pain.     acetaminophen  (TYLENOL ) 500 MG tablet Take 2 tablets (1,000 mg total) by mouth every 8 (eight) hours. 30 tablet 0   amLODipine  (NORVASC ) 10 MG tablet Take 1 tablet (10 mg total) by mouth daily. 90 tablet 3   atorvastatin  (LIPITOR) 80 MG tablet Take 1 tablet (80 mg total) by mouth daily. 90 tablet 3   blood glucose meter kit and supplies Use in the morning, afternoon, and at bedtime. 1 each 0   Blood Glucose Monitoring Suppl (BLOOD GLUCOSE MONITOR SYSTEM) w/Device KIT Use to check blood sugar 3 times daily. 1 kit 0   feeding supplement (ENSURE PLUS HIGH PROTEIN) LIQD Take 237 mLs by mouth 2 (two) times daily between meals.     gabapentin  (NEURONTIN ) 100 MG capsule Take 2 capsules (200 mg total) by mouth 3 (three) times daily. 180 capsule 0   glucose blood (ACCU-CHEK GUIDE) test strip Use as instructed to check once daily. 50 each 12   Glucose Blood (BLOOD GLUCOSE TEST STRIPS) STRP Use 1 each daily to test blood glucose levels. 100 each 3   Glucose Blood (BLOOD GLUCOSE TEST STRIPS) STRP Use 1 each at morning, noon, and bedtime to check blood glucose. 100 each 3   Lancet Device MISC Use to check blood glucose in the morning, at noon, and at bedtime. 1 each 0   levonorgestrel  (MIRENA , 52 MG,) 20 MCG/DAY IUD Mirena  20 mcg/24 hours (7 yrs) 52 mg intrauterine device  Take  by intrauterine route.     methocarbamol  (ROBAXIN ) 500 MG tablet Take 1 tablet (500 mg total) by mouth every 6 (six) hours as needed for muscle spasms. 90 tablet 1   ondansetron  (ZOFRAN -ODT) 4 MG disintegrating tablet Dissolve 1 tablet (4 mg total) by mouth every 6 (six) hours as needed for nausea. 20 tablet 2   ONETOUCH DELICA LANCETS FINE MISC Use to check blood sugar 3 times daily 100 each 3   oxyCODONE  (OXY IR/ROXICODONE ) 5 MG immediate release tablet Take 1-2 tablets (5-10 mg total) by mouth every 4 (four) hours as needed for moderate pain (pain score 4-6). 40 tablet 0   pantoprazole  (PROTONIX ) 40 MG tablet Take 1 tablet (40 mg total) by mouth daily. 90 tablet 3   polyethylene glycol (MIRALAX  / GLYCOLAX ) 17 g packet Take 17 g by mouth daily.     potassium chloride  SA (KLOR-CON  M15) 15 MEQ tablet Take 2 tablets (30 mEq total) by mouth 2 (two) times daily. 120 tablet 0  Probiotic Product (ULTRAFLORA IMMUNE HEALTH PO) Take 1 capsule by mouth daily.     dexamethasone  (DECADRON ) 4 MG tablet Take 2 tablets (8 mg total) by mouth daily. Start the day after chemotherapy for 2 days. Take with food. 30 tablet 1   lidocaine -prilocaine  (EMLA ) cream Apply to affected area once 30 g 3   ondansetron  (ZOFRAN ) 8 MG tablet Take 1 tablet (8 mg total) by mouth every 8 (eight) hours as needed for nausea or vomiting. Start on the third day after chemotherapy. 30 tablet 1   prochlorperazine  (COMPAZINE ) 10 MG tablet Take 1 tablet (10 mg total) by mouth every 6 (six) hours as needed for nausea or vomiting. 30 tablet 1   No current facility-administered medications for this visit.    PHYSICAL EXAMINATION: ECOG PERFORMANCE STATUS: 1 - Symptomatic but completely ambulatory  Vitals:   02/10/24 0919  BP: 130/76  Pulse: 76  Resp: 16  Temp: 98 F (36.7 C)  SpO2: 96%   Wt Readings from Last 3 Encounters:  02/10/24 167 lb 6.4 oz (75.9 kg)  02/03/24 167 lb (75.8 kg)  01/30/24 167 lb 9.6 oz (76 kg)      GENERAL:alert, no distress and comfortable SKIN: skin color, texture, turgor are normal, no rashes or significant lesions EYES: normal, Conjunctiva are pink and non-injected, sclera clear NECK: supple, thyroid  normal size, non-tender, without nodularity LYMPH:  no palpable lymphadenopathy in the cervical, axillary  LUNGS: clear to auscultation and percussion with normal breathing effort HEART: regular rate & rhythm and no murmurs and no lower extremity edema ABDOMEN:abdomen soft, non-tender and normal bowel sounds, midline incision has healed well. Musculoskeletal:no cyanosis of digits and no clubbing  NEURO: alert & oriented x 3 with fluent speech, no focal motor/sensory deficits  Physical Exam    LABORATORY DATA:  I have reviewed the data as listed    Latest Ref Rng & Units 02/07/2024    9:00 AM 01/16/2024    7:22 AM 01/15/2024    5:00 AM  CBC  WBC 4.0 - 10.5 K/uL 6.3  6.6  6.8   Hemoglobin 12.0 - 15.0 g/dL 86.8  87.6  86.7   Hematocrit 36.0 - 46.0 % 39.0  37.5  40.0   Platelets 150 - 400 K/uL 448  398  394         Latest Ref Rng & Units 02/07/2024    9:00 AM 01/18/2024    4:59 AM 01/17/2024    6:30 AM  CMP  Glucose 70 - 99 mg/dL 898   896   BUN 6 - 20 mg/dL 17   15   Creatinine 9.55 - 1.00 mg/dL 9.19  9.16  9.07   Sodium 135 - 145 mmol/L 142   143   Potassium 3.5 - 5.1 mmol/L 3.5   3.9   Chloride 98 - 111 mmol/L 105   103   CO2 22 - 32 mmol/L 32   27   Calcium  8.9 - 10.3 mg/dL 9.2   9.4   Total Protein 6.5 - 8.1 g/dL 7.9     Total Bilirubin 0.0 - 1.2 mg/dL 0.5     Alkaline Phos 38 - 126 U/L 62     AST 15 - 41 U/L 19     ALT 0 - 44 U/L 35         RADIOGRAPHIC STUDIES: I have personally reviewed the radiological images as listed and agreed with the findings in the report. No results found.    Orders Placed  This Encounter  Procedures   Consent Attestation for Oncology Treatment    The patient is informed of risks, benefits, side-effects of the prescribed  oncology treatment. Potential short term and long term side effects and response rates discussed. After a long discussion, the patient made informed decision to proceed.:   Yes   CBC with Differential (Cancer Center Only)    Standing Status:   Future    Expected Date:   02/16/2024    Expiration Date:   02/15/2025   CMP (Cancer Center only)    Standing Status:   Future    Expected Date:   02/16/2024    Expiration Date:   02/15/2025   CBC with Differential (Cancer Center Only)    Standing Status:   Future    Expected Date:   03/08/2024    Expiration Date:   03/08/2025   CMP (Cancer Center only)    Standing Status:   Future    Expected Date:   03/08/2024    Expiration Date:   03/08/2025   [facility] PHYSICIAN COMMUNICATION 1    0 Number of doses of oxaliplatin received at [facility] or outside facility. signature of Provider. If patient has received greater than 5 doses of oxaliplatin, the following pre-medications should be ordered: dexamethasone , diphenhydramine , and formulary histamine H2 antagonist. If patient cannot tolerate oral histamine H2 antagonist, IV may be given.   All questions were answered. The patient knows to call the clinic with any problems, questions or concerns. No barriers to learning was detected. The total time spent in the appointment was 40 minutes, including review of chart and various tests results, discussions about plan of care and coordination of care plan     Onita Mattock, MD 02/10/2024

## 2024-02-10 NOTE — Assessment & Plan Note (Addendum)
 pT1bN1M0 stage IB - Patient has chronic stomach issue after surgery for bowel obstruction in January 2021.  Routine screening EGD in July 2025 showed a 5 cm gastric polyps in distal stomach and a biopsy showed high-grade dysplasia and a small focus of adenocarcinoma in situ.  Staging CT scan was negative.  -Status post distal partial gastrostomy on January 11, 2024, which showed 1 cm invasive well to moderately differentiated adenocarcinoma, 1 out of 20 positive lymph node, margins are clear. -given the D2 node dissection, I recommend adjuvant chemotherapy CAPOX for 6 months

## 2024-02-10 NOTE — Progress Notes (Signed)
 START ON PATHWAY REGIMEN - Gastroesophageal     A cycle is every 21 days:     Capecitabine       Oxaliplatin   **Always confirm dose/schedule in your pharmacy ordering system**  Patient Characteristics: Gastric, Adenocarcinoma, Postoperative without Neoadjuvant Therapy, M0 (Pathologic Staging), pT3 or Higher or pN+, Postoperative Therapy, D2 LND Therapeutic Status: Postoperative without Neoadjuvant Therapy, M0 (Pathologic Staging) Histology: Adenocarcinoma Disease Classification: Gastric AJCC 8 Stage Grouping: IB AJCC N Category: pN1 AJCC T Category: pT1b AJCC M Category: cM0 Intent of Therapy: Curative Intent, Discussed with Patient

## 2024-02-10 NOTE — Telephone Encounter (Signed)
 Oral Oncology Patient Advocate Encounter  After completing a benefits investigation, prior authorization for Capecitabine  is not required at this time through [facility] .  Patient must fill through [facility]  Charlott [name],  CPhT-Adv  she/her/hers [place]  [facility] Pharmacy Technician Patient Advocate Specialist III [facility] Phone: [phone]  Fax: [phone] [email or web address]

## 2024-02-12 ENCOUNTER — Other Ambulatory Visit: Payer: Self-pay

## 2024-02-13 ENCOUNTER — Encounter (HOSPITAL_COMMUNITY): Payer: Self-pay | Admitting: General Surgery

## 2024-02-13 ENCOUNTER — Other Ambulatory Visit: Payer: Self-pay

## 2024-02-13 NOTE — Progress Notes (Signed)
 Anesthesia Chart Review: Same day workup  53 year old female follows with cardiology for history of HLD, HTN, primary hyperaldosteronism, SVT s/p ablation 2022. Echo 12/22/2023 showed LVEF 65 to 70%, mild aortic valve insufficiency, very mild dilation of the aortic root.  EKG 12/21/2023 showed normal sinus rhythm. She recently reestablished care with Dr. Verlin on 12/23/2023 for routine follow-up as well as preoperative evaluation.  Per note, Pre-operative cardiovascular examination: She has a normal cardiac exam, normal EKG and normal LV function by echo yesterday. She is very active. No concerning cardiac symptoms. She can proceed with her planned surgical procedure.  Recently underwent diagnostic laparoscopy and open distal gastrectomy 01/11/2024 without complication.   Other pertinent history includes GERD on PPI, non-insulin -dependent DM2, Recent diagnosis of gastric adenocarcinoma.   CMP and CBC 02/07/2024 reviewed, mild thrombocytosis with platelets 448, otherwise WNL.  DM2 well-controlled with A1c 5.7 on 01/23/2024.   EKG 12/21/2023: Sinus rhythm.  Rate 93.   TTE 12/22/2023:  1. Left ventricular ejection fraction, by estimation, is 65 to 70%. The  left ventricle has normal function. The left ventricle has no regional  wall motion abnormalities. There is moderate asymmetric left ventricular  hypertrophy of the septal segment.  Left ventricular diastolic parameters are indeterminate.   2. Right ventricular systolic function is normal. The right ventricular  size is normal.   3. The mitral valve is normal in structure. No evidence of mitral valve  regurgitation. No evidence of mitral stenosis.   4. The aortic valve is tricuspid. Aortic valve regurgitation is mild to  moderate. No aortic stenosis is present.   5. Aortic dilatation noted. There is mild dilatation of the aortic root,  measuring 39 mm. There is mild dilatation of the ascending aorta,  measuring 39 mm. There is mild dilatation of  the aortic arch, measuring 37  mm.   6. The inferior vena cava is normal in size with greater than 50%  respiratory variability, suggesting right atrial pressure of 3 mmHg.   Comparison(s): EF 60%, mild LVH, mild AI.       Lynwood Geofm RIGGERS [facility] Short Stay Center/Anesthesiology Phone [phone] 02/13/2024 10:50 AM

## 2024-02-13 NOTE — H&P (View-Only) (Signed)
 PROVIDER:  JINA CLAIR NEPHEW, MD Patient Care Team: Nche, Roselie Rockford, NP as PCP - General (Internal Medicine) Teresa Lonni Sharper, MD as Consulting Provider (General Surgery)   MRN: [id number] DOB: November 06, 1970 DATE OF ENCOUNTER: 01/30/2024 Initial History:   She experienced persistent intermittent abdominal pain since her surgery in January 2021 for a bowel obstruction. The pain is intermittent, with severity ranging from 3 to 7 out of 10. She is cautious about her diet to avoid hospital readmission.   Recently a CT scan and endoscopy revealed a mass. The endoscopy showed a polyp with dysplasia and a small focus of cancer. She has not taken any gastric medications like Protonix  or Pepcid and experiences intermittent nausea but no reflux.   Her medical history includes bowel obstruction surgeries at birth and in January 2021. She recalls having stomach issues prior to the 2021 surgery but not as severe. She experienced a sharp pain leading to her last surgery, which was performed at [facility].   She has not had a colonoscopy but is in the process of scheduling one. 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 She wears loose clothing to avoid pressure on her abdomen.   Due to the patient's small cancer and the possibility that she may have been able to have an endomucosal resection of a polyp and be able to avoid a partial gastrectomy, we sent the patient for an endoscopic ultrasound.  Unfortunately, the polyp/mass did extend into the submucosa of the stomach and was not amenable to Endomucosal resection.  Thankfully, the polyp was able to be described a bit better and was definitely in the distal greater curvature in the antrum.  It did also seem to be telescoping through the pylorus and causing intussusception intermittently.  It was felt that this may be contributing to some of her symptomatology.  Dr. Wilhelmenia did tattoo the lesion to  facilitate intraoperative location of the mass.  The previously located tiny neuroendocrine tumor was not seen and the previous biopsy site was located.   Interval History:    Patient went diagnostic laparoscopy and open distal gastrectomy January 11, 2024.  It took a few extra days to get her appetite and p.o. intake back up.  She also had some significant pain right after surgery. Unfortunately 1 of her lymph nodes was positive.     She is still taking multiple oxycodone  per day.  She is taking some gabapentin  and robaxin  also.  She is eating better and having bowel movements.  She has only had n/v after drinking ensure, so she stopped with that.     Pathology 01/11/2024 A. STOMACH, DISTAL, PARTIAL GASTRECTOMY:  - Invasive well to moderately differentiated adenocarcinoma,  approximately 1 cm, arising in association with a tubulovillous gastric  adenoma with high-grade dysplasia (3 cm)  - Carcinoma invades into submucosa  - Resection margins are negative for carcinoma  - Metastatic carcinoma to one of sixteen lymph nodes (1/16)  - See oncology table   B. LYMPH NODES, LEFT GASTRIC, REGIONAL RESECTION:  - Four benign lymph node (0/4)    Physical Examination:    General:  alert and oriented, well groomed. Abd:  soft, non tender. Staples in place.  No drainage.  No erythema.       Assessment and Plan:    Assessment Diagnoses and all orders for this visit:   Cancer of antrum of stomach (CMS/HHS-HCC) -     oxyCODONE  (ROXICODONE ) 5 MG  immediate release tablet; Take 1 tablet (5 mg total) by mouth every 4 (four) hours as needed for Pain for up to 5 days   Remove staples today.     Pt has follow up with Dr. Lanny 9/23 to discuss additional treatment.  Will need port for chemotherapy. Reviewed with port model.  Discussed risks.   Discussed with patient and family.

## 2024-02-13 NOTE — H&P (Signed)
 PROVIDER:  JINA CLAIR NEPHEW, MD Patient Care Team: Nche, Roselie Rockford, NP as PCP - General (Internal Medicine) Teresa Lonni Sharper, MD as Consulting Provider (General Surgery)   MRN: [id number] DOB: October 18, 1970 DATE OF ENCOUNTER: 01/30/2024 Initial History:   She experienced persistent intermittent abdominal pain since her surgery in January 2021 for a bowel obstruction. The pain is intermittent, with severity ranging from 3 to 7 out of 10. She is cautious about her diet to avoid hospital readmission.   Recently a CT scan and endoscopy revealed a mass. The endoscopy showed a polyp with dysplasia and a small focus of cancer. She has not taken any gastric medications like Protonix  or Pepcid and experiences intermittent nausea but no reflux.   Her medical history includes bowel obstruction surgeries at birth and in January 2021. She recalls having stomach issues prior to the 2021 surgery but not as severe. She experienced a sharp pain leading to her last surgery, which was performed at [facility].   She has not had a colonoscopy but is in the process of scheduling one. 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 She wears loose clothing to avoid pressure on her abdomen.   Due to the patient's small cancer and the possibility that she may have been able to have an endomucosal resection of a polyp and be able to avoid a partial gastrectomy, we sent the patient for an endoscopic ultrasound.  Unfortunately, the polyp/mass did extend into the submucosa of the stomach and was not amenable to Endomucosal resection.  Thankfully, the polyp was able to be described a bit better and was definitely in the distal greater curvature in the antrum.  It did also seem to be telescoping through the pylorus and causing intussusception intermittently.  It was felt that this may be contributing to some of her symptomatology.  Dr. Wilhelmenia did tattoo the lesion to  facilitate intraoperative location of the mass.  The previously located tiny neuroendocrine tumor was not seen and the previous biopsy site was located.   Interval History:    Patient went diagnostic laparoscopy and open distal gastrectomy January 11, 2024.  It took a few extra days to get her appetite and p.o. intake back up.  She also had some significant pain right after surgery. Unfortunately 1 of her lymph nodes was positive.     She is still taking multiple oxycodone  per day.  She is taking some gabapentin  and robaxin  also.  She is eating better and having bowel movements.  She has only had n/v after drinking ensure, so she stopped with that.     Pathology 01/11/2024 A. STOMACH, DISTAL, PARTIAL GASTRECTOMY:  - Invasive well to moderately differentiated adenocarcinoma,  approximately 1 cm, arising in association with a tubulovillous gastric  adenoma with high-grade dysplasia (3 cm)  - Carcinoma invades into submucosa  - Resection margins are negative for carcinoma  - Metastatic carcinoma to one of sixteen lymph nodes (1/16)  - See oncology table   B. LYMPH NODES, LEFT GASTRIC, REGIONAL RESECTION:  - Four benign lymph node (0/4)    Physical Examination:    General:  alert and oriented, well groomed. Abd:  soft, non tender. Staples in place.  No drainage.  No erythema.       Assessment and Plan:    Assessment Diagnoses and all orders for this visit:   Cancer of antrum of stomach (CMS/HHS-HCC) -     oxyCODONE  (ROXICODONE ) 5 MG  immediate release tablet; Take 1 tablet (5 mg total) by mouth every 4 (four) hours as needed for Pain for up to 5 days   Remove staples today.     Pt has follow up with Dr. Lanny 9/23 to discuss additional treatment.  Will need port for chemotherapy. Reviewed with port model.  Discussed risks.   Discussed with patient and family.

## 2024-02-13 NOTE — Progress Notes (Signed)
 SDW call  Patient was given pre-op instructions over the phone. Patient verbalized understanding of instructions provided.     PCP - Roselie Mood, NP Cardiologist - Dr. Lonni Cash, LOV 12/23/2023 with clearance EP Cardiologist: Dr. Danelle [place] Pulmonary:    PPM/ICD - denies Device Orders - na Rep Notified - na   Chest x-ray - na EKG -  12/21/2023 Stress Test -12/29/2021 ECHO - 12/22/2023 Cardiac Cath -   Sleep Study/sleep apnea/CPAP: denies  Type II diabetic.  A1C 5.7 on 01/23/2024. Takes no medication for diabetes, diet controlled at this time Fasting Blood sugar range: 90-130 How often check sugars: Weekly   Blood Thinner Instructions: denies Aspirin  Instructions:denies   ERAS Protcol - Clears until 1245   Anesthesia review: Yes. HTN, DM, SVT with ablation, HLD   Patient denies shortness of breath, fever, cough and chest pain over the phone call  Your procedure is scheduled on Tuesday February 14, 2024  Report to [facility] Main Entrance A at  1315 PM., then check in with the Admitting office.  Call this number if you have problems the morning of surgery:  [phone]   If you have any questions prior to your surgery date call [phone]: Open Monday-Friday 8am-4pm If you experience any cold or flu symptoms such as cough, fever, chills, shortness of breath, etc. between now and your scheduled surgery, please notify us  at the above number    Remember:  Do not eat after midnight the night before your surgery  You may drink clear liquids until  1245 the day of your surgery.   Clear liquids allowed are: Water , Non-Citrus Juices (without pulp), Carbonated Beverages, Clear Tea, Black Coffee ONLY (NO MILK, CREAM OR POWDERED CREAMER of any kind), and Gatorade   Take these medicines the morning of surgery with A SIP OF WATER :  Amlodipine , atorvastatin , xeloda , decadron , gabapentin , protonix   As needed: Tylenol , robaxin , zofran , oxycodone , compazine   As of  today, STOP taking any Aspirin  (unless otherwise instructed by your surgeon) Aleve , Naproxen , Ibuprofen , Motrin , Advil , Goody's, BC's, all herbal medications, fish oil, and all vitamins.

## 2024-02-13 NOTE — Anesthesia Preprocedure Evaluation (Signed)
 Anesthesia Evaluation  Patient identified by MRN, date of birth, ID band Patient awake    Reviewed: Allergy  & Precautions, NPO status , Patient's Chart, lab work & pertinent test results  Airway Mallampati: III  TM Distance: >3 FB Neck ROM: Full    Dental  (+) Partial Upper, Dental Advisory Given   Pulmonary neg pulmonary ROS   breath sounds clear to auscultation       Cardiovascular hypertension, Pt. on medications + dysrhythmias  Rhythm:Regular Rate:Normal     Neuro/Psych   Anxiety     negative neurological ROS     GI/Hepatic Neg liver ROS, PUD,GERD  ,,  Endo/Other  diabetes, Type 2, Oral Hypoglycemic Agents    Renal/GU negative Renal ROS     Musculoskeletal   Abdominal   Peds  Hematology   Anesthesia Other Findings   Reproductive/Obstetrics                              Anesthesia Physical Anesthesia Plan  ASA: 2  Anesthesia Plan: General   Post-op Pain Management: Tylenol  PO (pre-op)*   Induction: Intravenous  PONV Risk Score and Plan: 4 or greater and Ondansetron , Dexamethasone , Midazolam  and Scopolamine  patch - Pre-op  Airway Management Planned: LMA  Additional Equipment: None  Intra-op Plan:   Post-operative Plan: Extubation in OR  Informed Consent: I have reviewed the patients History and Physical, chart, labs and discussed the procedure including the risks, benefits and alternatives for the proposed anesthesia with the patient or authorized representative who has indicated his/her understanding and acceptance.     Dental advisory given  Plan Discussed with: CRNA  Anesthesia Plan Comments: (PAT note by Lynwood Hope, PA-C: 53 year old female follows with cardiology for history of HLD, HTN, primary hyperaldosteronism, SVT s/p ablation 2022. Echo 12/22/2023 showed LVEF 65 to 70%, mild aortic valve insufficiency, very mild dilation of the aortic root.  EKG 12/21/2023 showed  normal sinus rhythm. She recently reestablished care with Dr. Verlin on 12/23/2023 for routine follow-up as well as preoperative evaluation.  Per note, Pre-operative cardiovascular examination: She has a normal cardiac exam, normal EKG and normal LV function by echo yesterday. She is very active. No concerning cardiac symptoms. She can proceed with her planned surgical procedure.  Recently underwent diagnostic laparoscopy and open distal gastrectomy 01/11/2024 without complication.  Other pertinent history includes GERD on PPI, non-insulin -dependent DM2, Recent diagnosis of gastric adenocarcinoma.  CMP and CBC 02/07/2024 reviewed, mild thrombocytosis with platelets 448, otherwise WNL.  DM2 well-controlled with A1c 5.7 on 01/23/2024.  EKG 12/21/2023: Sinus rhythm.  Rate 93.  TTE 12/22/2023: 1. Left ventricular ejection fraction, by estimation, is 65 to 70%. The  left ventricle has normal function. The left ventricle has no regional  wall motion abnormalities. There is moderate asymmetric left ventricular  hypertrophy of the septal segment.  Left ventricular diastolic parameters are indeterminate.  2. Right ventricular systolic function is normal. The right ventricular  size is normal.  3. The mitral valve is normal in structure. No evidence of mitral valve  regurgitation. No evidence of mitral stenosis.  4. The aortic valve is tricuspid. Aortic valve regurgitation is mild to  moderate. No aortic stenosis is present.  5. Aortic dilatation noted. There is mild dilatation of the aortic root,  measuring 39 mm. There is mild dilatation of the ascending aorta,  measuring 39 mm. There is mild dilatation of the aortic arch, measuring 37  mm.  6. The  inferior vena cava is normal in size with greater than 50%  respiratory variability, suggesting right atrial pressure of 3 mmHg.   Comparison(s): EF 60%, mild LVH, mild AI.    )         Anesthesia Quick Evaluation

## 2024-02-14 ENCOUNTER — Other Ambulatory Visit: Payer: Self-pay

## 2024-02-14 ENCOUNTER — Encounter: Payer: Self-pay | Admitting: Hematology

## 2024-02-14 ENCOUNTER — Ambulatory Visit (HOSPITAL_COMMUNITY)

## 2024-02-14 ENCOUNTER — Encounter (HOSPITAL_COMMUNITY): Payer: Self-pay | Admitting: General Surgery

## 2024-02-14 ENCOUNTER — Ambulatory Visit (HOSPITAL_COMMUNITY)
Admission: RE | Admit: 2024-02-14 | Discharge: 2024-02-14 | Disposition: A | Discharge status: Home / Self Care | Attending: General Surgery | Admitting: General Surgery

## 2024-02-14 DIAGNOSIS — C163 Malignant neoplasm of pyloric antrum: Secondary | ICD-10-CM | POA: Insufficient documentation

## 2024-02-14 DIAGNOSIS — Z7984 Long term (current) use of oral hypoglycemic drugs: Secondary | ICD-10-CM | POA: Diagnosis not present

## 2024-02-14 DIAGNOSIS — Z539 Procedure and treatment not carried out, unspecified reason: Secondary | ICD-10-CM | POA: Diagnosis not present

## 2024-02-14 DIAGNOSIS — Z79899 Other long term (current) drug therapy: Secondary | ICD-10-CM | POA: Insufficient documentation

## 2024-02-14 DIAGNOSIS — I10 Essential (primary) hypertension: Secondary | ICD-10-CM | POA: Diagnosis not present

## 2024-02-14 DIAGNOSIS — E785 Hyperlipidemia, unspecified: Secondary | ICD-10-CM | POA: Diagnosis not present

## 2024-02-14 DIAGNOSIS — E2609 Other primary hyperaldosteronism: Secondary | ICD-10-CM | POA: Diagnosis not present

## 2024-02-14 DIAGNOSIS — K219 Gastro-esophageal reflux disease without esophagitis: Secondary | ICD-10-CM | POA: Insufficient documentation

## 2024-02-14 DIAGNOSIS — E119 Type 2 diabetes mellitus without complications: Secondary | ICD-10-CM | POA: Insufficient documentation

## 2024-02-14 LAB — GLUCOSE, CAPILLARY
Glucose-Capillary: 119 mg/dL — ABNORMAL HIGH (ref 70–99)
Glucose-Capillary: 81 mg/dL (ref 70–99)

## 2024-02-14 MED ORDER — CHLORHEXIDINE GLUCONATE CLOTH 2 % EX PADS
6.0000 | MEDICATED_PAD | Freq: Once | CUTANEOUS | Status: DC
Start: 2024-02-14 — End: 2024-02-14

## 2024-02-14 MED ORDER — LACTATED RINGERS IV SOLN: INTRAVENOUS | Status: DC

## 2024-02-14 MED ORDER — CEFAZOLIN SODIUM-DEXTROSE 2-4 GM/100ML-% IV SOLN
2.0000 g | INTRAVENOUS | Status: DC
  Filled 2024-02-14: qty 100

## 2024-02-14 MED ORDER — SCOPOLAMINE 1 MG/3DAYS TD PT72
1.0000 | MEDICATED_PATCH | TRANSDERMAL | Status: DC
Start: 2024-02-14 — End: 2024-02-14

## 2024-02-14 MED ORDER — CHLORHEXIDINE GLUCONATE 0.12 % MT SOLN
15.0000 mL | OROMUCOSAL | Status: AC
  Administered 2024-02-14: 15 mL via OROMUCOSAL
  Filled 2024-02-14: qty 15

## 2024-02-14 MED ORDER — ACETAMINOPHEN 500 MG PO TABS
1000.0000 mg | ORAL_TABLET | ORAL | Status: AC
  Administered 2024-02-14: 1000 mg via ORAL
  Filled 2024-02-14: qty 2

## 2024-02-14 NOTE — Progress Notes (Signed)
 Dr. Aron at bedside cancelling surgery at this time due to OR time constraints. Patient rescheduled for tomorrow at 1232. Patient made aware to arrive at 1000, npo after midnight, reviewed DOS medications with patient. Verbalized understanding of all instructions. Patient dc'd to home with daughter. Chart given to Patsy in PAT to print new labels for tomorrow's case.

## 2024-02-14 NOTE — Progress Notes (Signed)
 Pharmacist Chemotherapy Monitoring - Initial Assessment    Anticipated start date: 02/17/24   The following has been reviewed per standard work regarding the patient's treatment regimen: The patient's diagnosis, treatment plan and drug doses, and organ/hematologic function Lab orders and baseline tests specific to treatment regimen  The treatment plan start date, drug sequencing, and pre-medications Prior authorization status  Patient's documented medication list, including drug-drug interaction screen and prescriptions for anti-emetics and supportive care specific to the treatment regimen The drug concentrations, fluid compatibility, administration routes, and timing of the medications to be used The patient's access for treatment and lifetime cumulative dose history, if applicable  The patient's medication allergies and previous infusion related reactions, if applicable   Changes made to treatment plan:  treatment plan date  Follow up needed:  [facility] placement 02/15/24  Susan Whitaker, RPH, 02/14/2024  4:54 PM

## 2024-02-14 NOTE — Progress Notes (Signed)
 SDW call update: VM left for patient with new arrival day/time of 02/15/2024 at 1000, ERAS until 0930

## 2024-02-14 NOTE — Telephone Encounter (Signed)
 Oral Oncology Pharmacist Encounter  Email sent to [facility] to check on status of Xeloda  Rx. Per [employer], prescription is still processing. Request made for Rx to be expedited as patient is expected to start 02/17/24.  Oral Oncology Clinic will continue to follow.   Asberry Macintosh, PharmD, BCPS, BCOP Hematology/Oncology Clinical Pharmacist [phone] 02/14/2024 4:25 PM

## 2024-02-15 ENCOUNTER — Ambulatory Visit: Admit: 2024-02-15 | Admitting: General Surgery

## 2024-02-15 ENCOUNTER — Other Ambulatory Visit: Payer: Self-pay

## 2024-02-15 ENCOUNTER — Ambulatory Visit (HOSPITAL_COMMUNITY): Admitting: Anesthesiology

## 2024-02-15 ENCOUNTER — Ambulatory Visit (HOSPITAL_COMMUNITY): Admitting: Physician Assistant

## 2024-02-15 ENCOUNTER — Ambulatory Visit (HOSPITAL_COMMUNITY)
Admission: RE | Admit: 2024-02-15 | Discharge: 2024-02-15 | Disposition: A | Discharge status: Home / Self Care | Attending: General Surgery | Admitting: General Surgery

## 2024-02-15 ENCOUNTER — Ambulatory Visit (HOSPITAL_BASED_OUTPATIENT_CLINIC_OR_DEPARTMENT_OTHER): Admitting: Anesthesiology

## 2024-02-15 ENCOUNTER — Encounter (HOSPITAL_COMMUNITY): Admission: RE | Disposition: A | Payer: Self-pay | Source: Home / Self Care | Attending: General Surgery

## 2024-02-15 ENCOUNTER — Encounter: Payer: Self-pay | Admitting: Hematology

## 2024-02-15 ENCOUNTER — Ambulatory Visit (HOSPITAL_COMMUNITY)

## 2024-02-15 ENCOUNTER — Encounter (HOSPITAL_COMMUNITY): Payer: Self-pay | Admitting: General Surgery

## 2024-02-15 ENCOUNTER — Other Ambulatory Visit (HOSPITAL_COMMUNITY): Payer: Self-pay

## 2024-02-15 DIAGNOSIS — K219 Gastro-esophageal reflux disease without esophagitis: Secondary | ICD-10-CM | POA: Diagnosis not present

## 2024-02-15 DIAGNOSIS — E119 Type 2 diabetes mellitus without complications: Secondary | ICD-10-CM | POA: Insufficient documentation

## 2024-02-15 DIAGNOSIS — I10 Essential (primary) hypertension: Secondary | ICD-10-CM | POA: Insufficient documentation

## 2024-02-15 DIAGNOSIS — Z452 Encounter for adjustment and management of vascular access device: Secondary | ICD-10-CM | POA: Diagnosis not present

## 2024-02-15 DIAGNOSIS — C169 Malignant neoplasm of stomach, unspecified: Secondary | ICD-10-CM | POA: Diagnosis not present

## 2024-02-15 HISTORY — PX: PORTACATH PLACEMENT: SHX2246

## 2024-02-15 LAB — GLUCOSE, CAPILLARY
Glucose-Capillary: 113 mg/dL — ABNORMAL HIGH (ref 70–99)
Glucose-Capillary: 112 mg/dL — ABNORMAL HIGH (ref 70–99)

## 2024-02-15 SURGERY — INSERTION, TUNNELED CENTRAL VENOUS DEVICE, WITH PORT
Anesthesia: General

## 2024-02-15 MED ORDER — FENTANYL CITRATE (PF) 250 MCG/5ML IJ SOLN
INTRAMUSCULAR | Status: DC | PRN
  Administered 2024-02-15: 25 ug via INTRAVENOUS

## 2024-02-15 MED ORDER — FENTANYL CITRATE (PF) 250 MCG/5ML IJ SOLN
INTRAMUSCULAR | Status: AC
  Filled 2024-02-15: qty 5

## 2024-02-15 MED ORDER — MIDAZOLAM HCL 2 MG/2ML IJ SOLN
INTRAMUSCULAR | Status: AC
Start: 2024-02-15 — End: 2024-02-15
  Filled 2024-02-15: qty 2

## 2024-02-15 MED ORDER — INSULIN ASPART 100 UNIT/ML IJ SOLN: 0.0000 [IU] | INTRAMUSCULAR | Status: DC | PRN

## 2024-02-15 MED ORDER — OXYCODONE HCL 5 MG/5ML PO SOLN: 5.0000 mg | Freq: Once | ORAL | Status: DC | PRN

## 2024-02-15 MED ORDER — PROPOFOL 10 MG/ML IV BOLUS
INTRAVENOUS | Status: DC | PRN
  Administered 2024-02-15: 150 mg via INTRAVENOUS
  Administered 2024-02-15: 30 mg via INTRAVENOUS

## 2024-02-15 MED ORDER — FENTANYL CITRATE (PF) 100 MCG/2ML IJ SOLN
INTRAMUSCULAR | Status: AC
  Filled 2024-02-15: qty 2

## 2024-02-15 MED ORDER — BUPIVACAINE-EPINEPHRINE (PF) 0.25% -1:200000 IJ SOLN
INTRAMUSCULAR | Status: AC
Start: 2024-02-15 — End: 2024-02-15
  Filled 2024-02-15: qty 30

## 2024-02-15 MED ORDER — MIDAZOLAM HCL 2 MG/2ML IJ SOLN
INTRAMUSCULAR | Status: DC | PRN
  Administered 2024-02-15: 2 mg via INTRAVENOUS

## 2024-02-15 MED ORDER — ORAL CARE MOUTH RINSE: 15.0000 mL | Freq: Once | OROMUCOSAL | Status: AC

## 2024-02-15 MED ORDER — OXYCODONE HCL 5 MG PO TABS
5.0000 mg | ORAL_TABLET | ORAL | 0 refills | Status: DC | PRN
  Filled 2024-02-15: qty 40, 7d supply, fill #0

## 2024-02-15 MED ORDER — FENTANYL CITRATE (PF) 100 MCG/2ML IJ SOLN: 25.0000 ug | INTRAMUSCULAR | Status: DC | PRN

## 2024-02-15 MED ORDER — ACETAMINOPHEN 10 MG/ML IV SOLN
INTRAVENOUS | Status: AC
  Filled 2024-02-15: qty 100

## 2024-02-15 MED ORDER — OXYCODONE HCL 5 MG PO TABS: 5.0000 mg | ORAL_TABLET | Freq: Once | ORAL | Status: DC | PRN

## 2024-02-15 MED ORDER — HEPARIN 6000 UNIT IRRIGATION SOLUTION
Status: AC
  Filled 2024-02-15: qty 500

## 2024-02-15 MED ORDER — DROPERIDOL 2.5 MG/ML IJ SOLN: 0.6250 mg | Freq: Once | INTRAMUSCULAR | Status: DC | PRN

## 2024-02-15 MED ORDER — HEPARIN SOD (PORK) LOCK FLUSH 100 UNIT/ML IV SOLN
INTRAVENOUS | Status: AC
  Filled 2024-02-15: qty 5

## 2024-02-15 MED ORDER — EPHEDRINE 5 MG/ML INJ
INTRAVENOUS | Status: AC
Start: 2024-02-15 — End: 2024-02-15
  Filled 2024-02-15: qty 5

## 2024-02-15 MED ORDER — CEFAZOLIN SODIUM-DEXTROSE 2-3 GM-%(50ML) IV SOLR
INTRAVENOUS | Status: DC | PRN
  Administered 2024-02-15: 2 g via INTRAVENOUS

## 2024-02-15 MED ORDER — HEPARIN 6000 UNIT IRRIGATION SOLUTION
Status: DC | PRN
  Administered 2024-02-15: 1

## 2024-02-15 MED ORDER — PROPOFOL 10 MG/ML IV BOLUS
INTRAVENOUS | Status: AC
  Filled 2024-02-15: qty 20

## 2024-02-15 MED ORDER — EPHEDRINE SULFATE-NACL 50-0.9 MG/10ML-% IV SOSY
PREFILLED_SYRINGE | INTRAVENOUS | Status: DC | PRN
  Administered 2024-02-15: 10 mg via INTRAVENOUS
  Administered 2024-02-15 (×2): 5 mg via INTRAVENOUS

## 2024-02-15 MED ORDER — LIDOCAINE HCL (PF) 1 % IJ SOLN
INTRAMUSCULAR | Status: AC
  Filled 2024-02-15: qty 30

## 2024-02-15 MED ORDER — LACTATED RINGERS IV SOLN: INTRAVENOUS | Status: DC

## 2024-02-15 MED ORDER — PHENYLEPHRINE 80 MCG/ML (10ML) SYRINGE FOR IV PUSH (FOR BLOOD PRESSURE SUPPORT)
PREFILLED_SYRINGE | INTRAVENOUS | Status: DC | PRN
  Administered 2024-02-15 (×2): 160 ug via INTRAVENOUS
  Administered 2024-02-15 (×2): 80 ug via INTRAVENOUS
  Administered 2024-02-15: 120 ug via INTRAVENOUS

## 2024-02-15 MED ORDER — HEPARIN SOD (PORK) LOCK FLUSH 100 UNIT/ML IV SOLN
INTRAVENOUS | Status: DC | PRN
  Administered 2024-02-15: 500 [IU] via INTRAVENOUS

## 2024-02-15 MED ORDER — LIDOCAINE 2% (20 MG/ML) 5 ML SYRINGE
INTRAMUSCULAR | Status: DC | PRN
  Administered 2024-02-15: 100 mg via INTRAVENOUS

## 2024-02-15 MED ORDER — ONDANSETRON HCL 4 MG/2ML IJ SOLN
INTRAMUSCULAR | Status: DC | PRN
Start: 2024-02-15 — End: 2024-02-15
  Administered 2024-02-15: 4 mg via INTRAVENOUS

## 2024-02-15 MED ORDER — CHLORHEXIDINE GLUCONATE 0.12 % MT SOLN
15.0000 mL | Freq: Once | OROMUCOSAL | Status: AC
  Administered 2024-02-15: 15 mL via OROMUCOSAL
  Filled 2024-02-15: qty 15

## 2024-02-15 MED ORDER — CEFAZOLIN SODIUM-DEXTROSE 2-4 GM/100ML-% IV SOLN
INTRAVENOUS | Status: AC
  Filled 2024-02-15: qty 100

## 2024-02-15 MED ORDER — DEXAMETHASONE SODIUM PHOSPHATE 10 MG/ML IJ SOLN
INTRAMUSCULAR | Status: DC | PRN
  Administered 2024-02-15: 5 mg via INTRAVENOUS

## 2024-02-15 MED ORDER — LIDOCAINE HCL 1 % IJ SOLN
INTRAMUSCULAR | Status: DC | PRN
  Administered 2024-02-15: 8 mL via INTRAMUSCULAR

## 2024-02-15 MED ORDER — ACETAMINOPHEN 10 MG/ML IV SOLN
1000.0000 mg | Freq: Once | INTRAVENOUS | Status: DC | PRN
  Administered 2024-02-15: 1000 mg via INTRAVENOUS

## 2024-02-15 SURGICAL SUPPLY — 38 items
BAG COUNTER SPONGE SURGICOUNT (BAG) ×2 IMPLANT
BAG DECANTER FOR FLEXI CONT (MISCELLANEOUS) ×2 IMPLANT
CANISTER SUCTION 3000ML PPV (SUCTIONS) IMPLANT
CHLORAPREP W/TINT 26 (MISCELLANEOUS) ×2 IMPLANT
COVER SURGICAL LIGHT HANDLE (MISCELLANEOUS) ×2 IMPLANT
COVER TRANSDUCER ULTRASND GEL (DISPOSABLE) IMPLANT
DERMABOND ADVANCED .7 DNX12 (GAUZE/BANDAGES/DRESSINGS) ×2 IMPLANT
DRAPE C-ARM 42X120 X-RAY (DRAPES) ×2 IMPLANT
DRAPE CHEST BREAST 15X10 FENES (DRAPES) ×2 IMPLANT
DRAPE WARM FLUID 44X44 (DRAPES) IMPLANT
ELECT COATED BLADE 2.86 ST (ELECTRODE) ×2 IMPLANT
ELECTRODE REM PT RTRN 9FT ADLT (ELECTROSURGICAL) ×2 IMPLANT
GAUZE 4X4 16PLY ~~LOC~~+RFID DBL (SPONGE) ×2 IMPLANT
GEL ULTRASOUND 20GR AQUASONIC (MISCELLANEOUS) IMPLANT
GLOVE BIO SURGEON STRL SZ 6 (GLOVE) ×2 IMPLANT
GLOVE INDICATOR 6.5 STRL GRN (GLOVE) ×2 IMPLANT
GOWN STRL REUS W/ TWL LRG LVL3 (GOWN DISPOSABLE) ×2 IMPLANT
GOWN STRL REUS W/ TWL XL LVL3 (GOWN DISPOSABLE) ×2 IMPLANT
KIT BASIN OR (CUSTOM PROCEDURE TRAY) ×2 IMPLANT
KIT PORT POWER 8FR ISP CVUE (Port) IMPLANT
KIT TURNOVER KIT B (KITS) ×2 IMPLANT
NDL 22X1.5 STRL (OR ONLY) (MISCELLANEOUS) ×2 IMPLANT
NEEDLE 22X1.5 STRL (OR ONLY) (MISCELLANEOUS) ×1 IMPLANT
PAD ARMBOARD POSITIONER FOAM (MISCELLANEOUS) ×2 IMPLANT
PENCIL BUTTON HOLSTER BLD 10FT (ELECTRODE) ×2 IMPLANT
POSITIONER HEAD DONUT 9IN (MISCELLANEOUS) ×2 IMPLANT
SOLN 0.9% NACL 1000 ML (IV SOLUTION) ×1 IMPLANT
SOLN 0.9% NACL POUR BTL 1000ML (IV SOLUTION) ×2 IMPLANT
SPIKE FLUID TRANSFER (MISCELLANEOUS) ×4 IMPLANT
SUT MON AB 4-0 PC3 18 (SUTURE) ×2 IMPLANT
SUT PROLENE 2 0 SH DA (SUTURE) ×4 IMPLANT
SUT VIC AB 3-0 SH 27X BRD (SUTURE) ×2 IMPLANT
SYR 5ML LUER SLIP (SYRINGE) ×2 IMPLANT
TOWEL GREEN STERILE (TOWEL DISPOSABLE) ×2 IMPLANT
TOWEL GREEN STERILE FF (TOWEL DISPOSABLE) ×2 IMPLANT
TRAY LAPAROSCOPIC MC (CUSTOM PROCEDURE TRAY) ×2 IMPLANT
TUBE CONNECTING 12X1/4 (SUCTIONS) IMPLANT
YANKAUER SUCT BULB TIP NO VENT (SUCTIONS) IMPLANT

## 2024-02-15 NOTE — Interval H&P Note (Signed)
 History and Physical Interval Note:  02/15/2024 11:30 AM  Susan Whitaker  has presented today for surgery, with the diagnosis of gastric cancer.  The various methods of treatment have been discussed with the patient and family. After consideration of risks, benefits and other options for treatment, the patient has consented to  Procedure(s): INSERTION, TUNNELED CENTRAL VENOUS DEVICE, WITH PORT (N/A) as a surgical intervention.  The patient's history has been reviewed, patient examined, no change in status, stable for surgery.  I have reviewed the patient's chart and labs.  Questions were answered to the patient's satisfaction.     [name]

## 2024-02-15 NOTE — Op Note (Signed)
 PREOPERATIVE DIAGNOSIS:  gastric cancer     POSTOPERATIVE DIAGNOSIS:  Same     PROCEDURE: Left subclavian port placement, Bard ClearVue  Power Port, MRI safe, 8-French.      SURGEON:  [name], MD      ANESTHESIA:  General   FINDINGS:  Good venous return, easy flush, and tip of the catheter and   SVC 22.5 cm.      SPECIMEN:  None.      ESTIMATED BLOOD LOSS:  Minimal.      COMPLICATIONS:  None known.      PROCEDURE:  Pt was identified in the holding area and taken to   the operating room, where patient was placed supine on the operating room   table.  General anesthesia was induced.  Patient's arms were tucked and the upper   chest and neck were prepped and draped in sterile fashion.  Time-out was   performed according to the surgical safety check list.  When all was   correct, we continued.   Local anesthetic was administered just under the angle of the left clavicle.  The vein was accessed with 2 pass(es) of the needle. There was good venous return and the wire passed easily with no ectopy.   Fluoroscopy was used to confirm that the wire was in the vena cava.      The patient was placed back level and the area for the pocket was anethetized   with local anesthetic.  A 3-cm transverse incision was made with a #15   blade.  Cautery was used to divide the subcutaneous tissues down to the   pectoralis muscle.  An [employer] retractor was used to elevate the skin   while a pocket was created on top of the pectoralis fascia.  The port   was placed into the pocket to confirm that it was of adequate size.  The   catheter was preattached to the port.  The port was then secured to the   pectoralis fascia with four 2-0 Prolene sutures.  These were clamped and   not tied down yet.    The catheter was tunneled through to the wire exit   site.  The catheter was placed along the wire to determine what length it should be to be in the SVC.  The catheter was cut at 22.5 cm.  The tunneler  sheath and dilator were passed over the wire and the dilator and wire were removed.  The catheter was advanced through the tunneler sheath and the tunneler sheath was pulled away.  Care was taken to keep the catheter in the tunneler sheath as this occurred. This was advanced and the tunneler sheath was removed.  There was good venous   return and easy flush of the catheter.  The Prolene sutures were tied   down to the pectoral fascia.  The skin was reapproximated using 3-0   Vicryl interrupted deep dermal sutures.    Fluoroscopy was used to re-confirm good position of the catheter.  The skin   was then closed using 4-0 Monocryl in a subcuticular fashion.  The port was flushed with concentrated heparin  flush as well.  The wounds were then cleaned, dried, and dressed with Dermabond.  The patient was awakened from anesthesia and taken to the PACU in stable condition.  Needle, sponge, and instrument counts were correct.               [name], MD

## 2024-02-15 NOTE — Discharge Instructions (Addendum)
[facility] Office Phone Number [phone]   POST OP INSTRUCTIONS  Always review your discharge instruction sheet given to you by the facility where your surgery was performed.  IF YOU HAVE DISABILITY OR FAMILY LEAVE FORMS, YOU MUST BRING THEM TO THE OFFICE FOR PROCESSING.  DO NOT GIVE THEM TO YOUR DOCTOR.  Take 2 tylenol (acetominophen) three times a day for 3 days.  If you still have pain, add ibuprofen with food in between if able to take this (if you have kidney issues or stomach issues, do not take ibuprofen).  If both of those are not enough, add the narcotic pain pill.  If you find you are needing a lot of this overnight after surgery, call the next morning for a refill.   Take your usually prescribed medications unless otherwise directed If you need a refill on your pain medication, please contact your pharmacy.  They will contact our office to request authorization.  Prescriptions will not be filled after 5pm or on week-ends. You should eat very light the first 24 hours after surgery, such as soup, crackers, pudding, etc.  Resume your normal diet the day after surgery It is common to experience some constipation if taking pain medication after surgery.  Increasing fluid intake and taking a stool softener will usually help or prevent this problem from occurring.  A mild laxative (Milk of Magnesia or Miralax) should be taken according to package directions if there are no bowel movements after 48 hours. You may shower in 48 hours.  The surgical glue will flake off in 2-3 weeks.   ACTIVITIES:  No strenuous activity or heavy lifting for 1 week.   You may drive when you no longer are taking prescription pain medication, you can comfortably wear a seatbelt, and you can safely maneuver your car and apply brakes. RETURN TO WORK:  __________to be determined. _______________ You should see your doctor in the office for a follow-up appointment approximately three-four weeks after  your surgery.    WHEN TO CALL YOUR DOCTOR: Fever over 101.0 Nausea and/or vomiting. Extreme swelling or bruising. Continued bleeding from incision. Increased pain, redness, or drainage from the incision.  The clinic staff is available to answer your questions during regular business hours.  Please don't hesitate to call and ask to speak to one of the nurses for clinical concerns.  If you have a medical emergency, go to the nearest emergency room or call 911.  A surgeon from [facility] is always on call at the hospital.  For further questions, please visit [email or web address]   

## 2024-02-15 NOTE — Transfer of Care (Signed)
 Immediate Anesthesia Transfer of Care Note  Patient: Susan Whitaker  Procedure(s) Performed: INSERTION, TUNNELED CENTRAL VENOUS DEVICE, WITH PORT  Patient Location: PACU  Anesthesia Type:General  Level of Consciousness: awake, drowsy, and patient cooperative  Airway & Oxygen Therapy: Patient Spontanous Breathing  Post-op Assessment: Report given to RN and Post -op Vital signs reviewed and stable  Post vital signs: Reviewed and stable  Last Vitals:  Vitals Value Taken Time  BP 137/65 02/15/24 12:49  Temp    Pulse 92 02/15/24 12:51  Resp 15 02/15/24 12:51  SpO2 99 % 02/15/24 12:51  Vitals shown include unfiled device data.  Last Pain:  Vitals:   02/15/24 1053  TempSrc:   PainSc: 3          Complications: No notable events documented.

## 2024-02-15 NOTE — Anesthesia Procedure Notes (Signed)
 Procedure Name: LMA Insertion Date/Time: 02/15/2024 11:59 AM  Performed by: Marva Lonni PARAS, CRNAPre-anesthesia Checklist: Patient identified, Emergency Drugs available, Suction available and Patient being monitored Patient Re-evaluated:Patient Re-evaluated prior to induction Oxygen Delivery Method: Circle System Utilized Preoxygenation: Pre-oxygenation with 100% oxygen Induction Type: IV induction Ventilation: Mask ventilation without difficulty LMA: LMA inserted LMA Size: 4.0 Number of attempts: 1 Placement Confirmation: positive ETCO2 Tube secured with: Tape Dental Injury: Teeth and Oropharynx as per pre-operative assessment

## 2024-02-15 NOTE — Anesthesia Preprocedure Evaluation (Signed)
 Anesthesia Evaluation  Patient identified by MRN, date of birth, ID band Patient awake    Reviewed: Allergy  & Precautions, NPO status , Patient's Chart, lab work & pertinent test results, reviewed documented beta blocker date and time   History of Anesthesia Complications Negative for: history of anesthetic complications  Airway Mallampati: III  TM Distance: >3 FB   Mouth opening: Limited Mouth Opening  Dental  (+) Edentulous Upper   Pulmonary neg COPD   breath sounds clear to auscultation       Cardiovascular hypertension, (-) CAD and (-) Past MI + dysrhythmias  Rhythm:Regular Rate:Normal     Neuro/Psych neg Seizures PSYCHIATRIC DISORDERS Anxiety        GI/Hepatic PUD,GERD  ,,(+) neg Cirrhosis        Endo/Other  diabetes, Type 2    Renal/GU Renal disease     Musculoskeletal   Abdominal   Peds  Hematology   Anesthesia Other Findings   Reproductive/Obstetrics                              Anesthesia Physical Anesthesia Plan  ASA: 2  Anesthesia Plan: General   Post-op Pain Management:    Induction: Intravenous  PONV Risk Score and Plan: 2 and Ondansetron  and Dexamethasone   Airway Management Planned: LMA  Additional Equipment:   Intra-op Plan:   Post-operative Plan: Extubation in OR  Informed Consent: I have reviewed the patients History and Physical, chart, labs and discussed the procedure including the risks, benefits and alternatives for the proposed anesthesia with the patient or authorized representative who has indicated his/her understanding and acceptance.     Dental advisory given  Plan Discussed with: CRNA  Anesthesia Plan Comments:         Anesthesia Quick Evaluation

## 2024-02-16 ENCOUNTER — Encounter (HOSPITAL_COMMUNITY): Payer: Self-pay | Admitting: General Surgery

## 2024-02-16 ENCOUNTER — Other Ambulatory Visit: Payer: Self-pay

## 2024-02-16 ENCOUNTER — Inpatient Hospital Stay: Attending: Hematology

## 2024-02-16 DIAGNOSIS — C169 Malignant neoplasm of stomach, unspecified: Secondary | ICD-10-CM

## 2024-02-16 DIAGNOSIS — E876 Hypokalemia: Secondary | ICD-10-CM | POA: Insufficient documentation

## 2024-02-16 DIAGNOSIS — Z5111 Encounter for antineoplastic chemotherapy: Secondary | ICD-10-CM | POA: Insufficient documentation

## 2024-02-16 NOTE — Assessment & Plan Note (Signed)
 pT1bN1M0 stage IB - Patient has chronic stomach issue after surgery for bowel obstruction in January 2021.  Routine screening EGD in July 2025 showed a 5 cm gastric polyps in distal stomach and a biopsy showed high-grade dysplasia and a small focus of adenocarcinoma in situ.  Staging CT scan was negative.  -Status post distal partial gastrostomy on January 11, 2024, which showed 1 cm invasive well to moderately differentiated adenocarcinoma, 1 out of 20 positive lymph node, margins are clear. -given the D2 node dissection, I recommend adjuvant chemotherapy CAPOX for 6 months

## 2024-02-16 NOTE — Telephone Encounter (Signed)
 Oral Chemotherapy Pharmacist Encounter  I spoke with patient for overview of: Xeloda  (capecitabine ) for the treatment of stage IB gastric cancer in conjunction with oxaliplatin, planned duration 8 cycles.   Treatment goal: Curative  Counseled patient on administration, dosing, side effects, monitoring, drug-food interactions, safe handling, storage, and disposal.  Patient will take Xeloda  500mg  tablets, 3 tablets (1500mg ) by mouth in AM and 3 tabs (1500mg ) by mouth in PM, within 30 minutes of finishing meals, on days 1-14 of each 21 day cycle.   Oxaliplatin will be infused on day 1 of each 21 day cycle.  Xeloda  start date: 02/17/24 PM (Xeloda  is arriving to patient's home on 02/16/24 AM) Oxaliplatin start date: 02/17/24  Adverse effects include but are not limited to: fatigue, decreased blood counts, GI upset, diarrhea, mouth sores, and hand-foot syndrome. Hand-foot syndrome: discussed use of cream such as [place] Smooth Extra Care 20 or equivalent advanced care cream that has 20% urea content for advanced skin hydration while on Xeloda . Additionally discussed use of OTC Voltaren gel for HFS prophylaxis. Discussed recommended use of Voltaren gel is 1 finger tip application for front/backside of hands and then 1 fingertip application to bottoms of feet twice a day for up to 12 weeks.  Diarrhea: Patient will obtain Imodium (loperamide) to have on hand if they experience diarrhea. Patient knows to alert the office of 4 or more loose stools above baseline.  Reviewed with patient importance of keeping a medication schedule and plan for any missed doses. No barriers to medication adherence identified.  Medication reconciliation performed and medication/allergy  list updated.  Distress thermometer flowsheet: Distress thermometer not completed during telephone call as this was completed in patient's IV education earlier today.   Communication and Learning Assessment Primary learner: Patient Barriers  to learning: No barriers Preferred language: English Learning preferences: Listening Reading  All questions answered.  Ms. [name] voiced understanding and appreciation.   Medication education handout placed in mail for patient. Patient knows to call the office with questions or concerns. Oral Chemotherapy Clinic phone number provided to patient.   Asberry Macintosh, PharmD, BCPS, BCOP Hematology/Oncology Clinical Pharmacist [phone] 02/16/2024 2:44 PM

## 2024-02-16 NOTE — Progress Notes (Signed)
 Verbal order w/readback order from Dr. Lanny for [employer] to be drawn on 02/17/2024. Order placed in EPIC and in [employer] portal.  Signatera kit and requisition given to [employer].

## 2024-02-16 NOTE — Anesthesia Postprocedure Evaluation (Signed)
 Anesthesia Post Note  Patient: Susan Whitaker  Procedure(s) Performed: INSERTION, TUNNELED CENTRAL VENOUS DEVICE, WITH PORT     Patient location during evaluation: PACU Anesthesia Type: General Level of consciousness: awake and alert Pain management: pain level controlled Vital Signs Assessment: post-procedure vital signs reviewed and stable Respiratory status: spontaneous breathing, nonlabored ventilation, respiratory function stable and patient connected to nasal cannula oxygen Cardiovascular status: blood pressure returned to baseline and stable Postop Assessment: no apparent nausea or vomiting Anesthetic complications: no   No notable events documented.  Last Vitals:  Vitals:   02/15/24 1315 02/15/24 1330  BP: 137/60 122/62  Pulse: 84 80  Resp: 11 14  Temp:  36.4 C  SpO2: 100% 100%    Last Pain:  Vitals:   02/15/24 1330  TempSrc:   PainSc: 4                  Lynwood MARLA Cornea

## 2024-02-16 NOTE — Progress Notes (Unsigned)
 Patient Care Team: Nche, Roselie Rockford, NP as PCP - General (Internal Medicine) Waddell Danelle ORN, MD as PCP - Electrophysiology (Cardiology) Verlin Lonni BIRCH, MD as PCP - Cardiology (Cardiology) Prescilla Beams, MD as Consulting Physician (Nephrology) Waddell Danelle ORN, MD as Consulting Physician (Cardiology) Gloriann Chick, MD as Consulting Physician (Obstetrics and Gynecology) Camillo Golas, MD as Attending Physician (Ophthalmology) Eilleen Richerd GRADE, RN as [facility] Management Lanny Callander, MD as Consulting Physician (Hematology and Oncology) Christiana Kerri HERO, RN as Registered Nurse  Clinic Day:  02/17/2024  Referring physician: Lanny Callander, MD  ASSESSMENT & PLAN:   Assessment & Plan: Gastric adenocarcinoma [facility]) pT1bN1M0 stage IB - Patient has chronic stomach issue after surgery for bowel obstruction in January 2021.  Routine screening EGD in July 2025 showed a 5 cm gastric polyps in distal stomach and a biopsy showed high-grade dysplasia and a small focus of adenocarcinoma in situ.  Staging CT scan was negative.  -Status post distal partial gastrostomy on January 11, 2024, which showed 1 cm invasive well to moderately differentiated adenocarcinoma, 1 out of 20 positive lymph node, margins are clear. -given the D2 node dissection, adjuvant chemotherapy CAPOX and then for 6 months. -02/17/2024 -she presents for cycle 1 day 1 adjuvant chemotherapy CapeOx.    Hypokalemia Potassium 3.3 today.  Patient states she is trying to take oral potassium 15 mEq twice daily.  Sometimes difficult to tolerate tablets.  They are very thick and sometimes feel like they get stuck.  Will change to 20 mEq solution twice daily.  New prescription sent to her pharmacy.  Will monitor with every visit and treat hypokalemia as indicated.  Gastric adenocarcinoma Patient presents for cycle 1 day 1 chemotherapy with oxaliplatin.  Has also started capecitabine  orally.  She will take 3 tablets twice daily for 14 days  followed by 7 days off.  Cycles are 21 days.  Plan Patient seen in infusion suite today. Labs reviewed. -CBC unremarkable. - Mild hypokalemia.  Prescription potassium changed to 20 mEq twice daily in solution form.  Will discontinue tablets. Patient labs and presentation are appropriate for treatment today. Proceed with cycle 1 day 1 oxaliplatin. Capecitabine  3 tablets twice daily for 14 days followed by 7 days off starting today. Labs/labs, follow-up, and cycle 2 day 1 chemotherapy with CapeOx as scheduled.  The patient understands the plans discussed today and is in agreement with them.  She knows to contact our office if she develops concerns prior to her next appointment.  I provided 20 minutes of face-to-face time during this encounter and > 50% was spent counseling as documented under my assessment and plan.    Powell FORBES Lessen, NP  [facility] - A DEPT OF [facility] [address] [place] [address] Dept: [phone] Dept Fax: [phone]   No orders of the defined types were placed in this encounter.     CHIEF COMPLAINT:  CC: Gastric adenocarcinoma  Current Treatment: Pending adjuvant chemotherapy CapeOx  INTERVAL HISTORY:  Susan Whitaker is here today for repeat clinical assessment.  She was last seen by Dr. Lanny on 02/10/2024.  Today, she starts cycle 1 day 1 chemotherapy CapeOx.  She denies new symptoms or concerns today.  States she is having a difficult time with 64 ounces of water  per day.  Kidney functions and electrolytes look good today.  Mild hypokalemia.  She denies chest pain, chest pressure, or shortness of breath. She denies headaches  or visual disturbances. He denies abdominal pain, nausea, vomiting, or changes in bowel or bladder habits.  She denies fevers or chills. She denies pain. Her appetite is good. Her weight has been stable.  I have reviewed the past medical history, past surgical history,  social history and family history with the patient and they are unchanged from previous note.  ALLERGIES:  is allergic to lisinopril.  MEDICATIONS:  Current Outpatient Medications  Medication Sig Dispense Refill   Potassium Chloride  40 MEQ/15ML (20%) SOLN Take 20 mEq by mouth 2 (two) times daily. 450 mL 1   Accu-Chek Softclix Lancets lancets Use 1 each in the morning, at noon, and at bedtime to check blood glucose. 100 each 3   acetaminophen  (TYLENOL ) 325 MG tablet Take 2 tablets (650 mg total) by mouth every 4 (four) hours as needed for headache or mild pain.     acetaminophen  (TYLENOL ) 500 MG tablet Take 2 tablets (1,000 mg total) by mouth every 8 (eight) hours. 30 tablet 0   amLODipine  (NORVASC ) 10 MG tablet Take 1 tablet (10 mg total) by mouth daily. 90 tablet 3   atorvastatin  (LIPITOR) 80 MG tablet Take 1 tablet (80 mg total) by mouth daily. 90 tablet 3   blood glucose meter kit and supplies Use in the morning, afternoon, and at bedtime. 1 each 0   Blood Glucose Monitoring Suppl (BLOOD GLUCOSE MONITOR SYSTEM) w/Device KIT Use to check blood sugar 3 times daily. 1 kit 0   capecitabine  (XELODA ) 500 MG tablet Take 3 tablets (1,500 mg total) by mouth 2 (two) times daily after a meal. Take within 30 minutes after meals. Take for 14 days on, 7 days off. Repeat every 21 days. Start on same day of iv chemo 84 tablet 0   dexamethasone  (DECADRON ) 4 MG tablet Take 2 tablets (8 mg total) by mouth daily. Start the day after chemotherapy for 2 days. Take with food. 30 tablet 1   feeding supplement (ENSURE PLUS HIGH PROTEIN) LIQD Take 237 mLs by mouth 2 (two) times daily between meals. (Patient not taking: Reported on 02/13/2024)     gabapentin  (NEURONTIN ) 100 MG capsule Take 2 capsules (200 mg total) by mouth 3 (three) times daily. 180 capsule 0   glucose blood (ACCU-CHEK GUIDE) test strip Use as instructed to check once daily. 50 each 12   Glucose Blood (BLOOD GLUCOSE TEST STRIPS) STRP Use 1 each daily to  test blood glucose levels. 100 each 3   Glucose Blood (BLOOD GLUCOSE TEST STRIPS) STRP Use 1 each at morning, noon, and bedtime to check blood glucose. 100 each 3   Lancet Device MISC Use to check blood glucose in the morning, at noon, and at bedtime. 1 each 0   levonorgestrel  (MIRENA , 52 MG,) 20 MCG/DAY IUD Mirena  20 mcg/24 hours (7 yrs) 52 mg intrauterine device  Take by intrauterine route.     lidocaine -prilocaine  (EMLA ) cream Apply to affected area once 30 g 3   methocarbamol  (ROBAXIN ) 500 MG tablet Take 1 tablet (500 mg total) by mouth every 6 (six) hours as needed for muscle spasms. 90 tablet 1   ondansetron  (ZOFRAN ) 8 MG tablet Take 1 tablet (8 mg total) by mouth every 8 (eight) hours as needed for nausea or vomiting. Start on the third day after chemotherapy. 30 tablet 1   ondansetron  (ZOFRAN -ODT) 4 MG disintegrating tablet Dissolve 1 tablet (4 mg total) by mouth every 6 (six) hours as needed for nausea. 20 tablet 2   ONETOUCH DELICA  LANCETS FINE MISC Use to check blood sugar 3 times daily 100 each 3   oxyCODONE  (OXY IR/ROXICODONE ) 5 MG immediate release tablet Take 1-2 tablets (5-10 mg total) by mouth every 4 (four) hours as needed for moderate pain (pain score 4-6). 40 tablet 0   pantoprazole  (PROTONIX ) 40 MG tablet Take 1 tablet (40 mg total) by mouth daily. 90 tablet 3   polyethylene glycol (MIRALAX  / GLYCOLAX ) 17 g packet Take 17 g by mouth daily.     Probiotic Product (ULTRAFLORA IMMUNE HEALTH PO) Take 1 capsule by mouth daily.     prochlorperazine  (COMPAZINE ) 10 MG tablet Take 1 tablet (10 mg total) by mouth every 6 (six) hours as needed for nausea or vomiting. 30 tablet 1   No current facility-administered medications for this visit.   Facility-Administered Medications Ordered in Other Visits  Medication Dose Route Frequency Provider Last Rate Last Admin   dextrose  5 % solution   Intravenous Continuous Lanny Callander, MD 10 mL/hr at 02/17/24 1147 New Bag at 02/17/24 1147   oxaliplatin  (ELOXATIN) 250 mg in dextrose  5 % 500 mL chemo infusion  130 mg/m2 (Treatment Plan Recorded) Intravenous Once Lanny Callander, MD 275 mL/hr at 02/17/24 1230 250 mg at 02/17/24 1230    HISTORY OF PRESENT ILLNESS:   Oncology History  Gastric adenocarcinoma (HCC)  11/30/2023 Initial Diagnosis   Gastric adenocarcinoma (HCC)   01/11/2024 Cancer Staging   Staging form: Stomach, AJCC 8th Edition - Pathologic stage from 01/11/2024: Stage IB (pT1b, pN1, cM0) - Signed by Lanny Callander, MD on 01/25/2024 Histologic grade (G): G2 Histologic grading system: 3 grade system Residual tumor (R): R0   02/17/2024 -  Chemotherapy   Patient is on Treatment Plan : GASTRIC CapeOx (1000/130) q21d x 8 Cycles          REVIEW OF SYSTEMS:   Constitutional: Denies fevers, chills or abnormal weight loss Eyes: Denies blurriness of vision Ears, nose, mouth, throat, and face: Denies mucositis or sore throat Respiratory: Denies cough, dyspnea or wheezes Cardiovascular: Denies palpitation, chest discomfort or lower extremity swelling Gastrointestinal:  Denies nausea, heartburn or change in bowel habits Skin: Denies abnormal skin rashes Lymphatics: Denies new lymphadenopathy or easy bruising Neurological:Denies numbness, tingling or new weaknesses Behavioral/Psych: Mood is stable, no new changes  All other systems were reviewed with the patient and are negative.   VITALS:   Today's Vitals   02/17/24 1312  BP: 127/64  Pulse: 69  Resp: 17  Temp: 98.3 F (36.8 C)  SpO2: 100%  Weight: 166 lb (75.3 kg)   Body mass index is 29.41 kg/m.    Wt Readings from Last 3 Encounters:  02/17/24 166 lb (75.3 kg)  02/17/24 166 lb 5 oz (75.4 kg)  02/15/24 167 lb (75.8 kg)    Body mass index is 29.41 kg/m.  Performance status (ECOG): 1 - Symptomatic but completely ambulatory  PHYSICAL EXAM:   GENERAL:alert, no distress and comfortable SKIN: skin color, texture, turgor are normal, no rashes or significant lesions EYES:  normal, Conjunctiva are pink and non-injected, sclera clear OROPHARYNX:no exudate, no erythema and lips, buccal mucosa, and tongue normal  NECK: supple, thyroid  normal size, non-tender, without nodularity LYMPH:  no palpable lymphadenopathy in the cervical, axillary or inguinal LUNGS: clear to auscultation and percussion with normal breathing effort HEART: regular rate & rhythm and no murmurs and no lower extremity edema ABDOMEN:abdomen soft, non-tender and normal bowel sounds Musculoskeletal:no cyanosis of digits and no clubbing  NEURO: alert & oriented  x 3 with fluent speech, no focal motor/sensory deficits  LABORATORY DATA:  I have reviewed the data as listed    Component Value Date/Time   NA 145 02/17/2024 1009   NA 138 08/21/2012 1002   K 3.3 (L) 02/17/2024 1009   K 3.5 08/21/2012 1002   CL 105 02/17/2024 1009   CL 102 08/21/2012 1002   CO2 35 (H) 02/17/2024 1009   CO2 28 08/21/2012 1002   GLUCOSE 115 (H) 02/17/2024 1009   GLUCOSE 132 (H) 08/21/2012 1002   BUN 12 02/17/2024 1009   BUN 16.7 08/21/2012 1002   CREATININE 0.80 02/17/2024 1009   CREATININE 1.03 05/06/2023 0822   CREATININE 1.0 08/21/2012 1002   CALCIUM  9.7 02/17/2024 1009   CALCIUM  8.9 08/21/2012 1002   PROT 8.4 (H) 02/17/2024 1009   PROT 7.6 08/21/2012 1002   ALBUMIN  4.3 02/17/2024 1009   ALBUMIN  3.3 (L) 08/21/2012 1002   AST 16 02/17/2024 1009   AST 10 08/21/2012 1002   ALT 21 02/17/2024 1009   ALT 8 08/21/2012 1002   ALKPHOS 73 02/17/2024 1009   ALKPHOS 60 08/21/2012 1002   BILITOT 0.4 02/17/2024 1009   BILITOT 0.22 08/21/2012 1002   GFRNONAA >60 02/17/2024 1009   GFRNONAA 86 12/03/2013 1112   GFRAA >60 02/05/2020 1017   GFRAA >89 12/03/2013 1112    Lab Results  Component Value Date   WBC 8.1 02/17/2024   NEUTROABS 4.2 02/17/2024   HGB 12.6 02/17/2024   HCT 37.6 02/17/2024   MCV 87.4 02/17/2024   PLT 367 02/17/2024    RADIOGRAPHIC STUDIES: DG CHEST PORT 1 VIEW Result Date:  02/15/2024 CLINICAL DATA:  Port-A-Cath placement. EXAM: PORTABLE CHEST 1 VIEW COMPARISON:  06/10/2020 FINDINGS: Left subclavian single-lumen Port-A-Cath placed with catheter tip at the SVC/RA junction. No pneumothorax. Lungs demonstrate no pulmonary edema or pleural fluid. Mild elevation of the right hemidiaphragm. IMPRESSION: Left subclavian Port-A-Cath tip at the SVC/RA junction. No pneumothorax. Electronically Signed   By: Marcey Moan M.D.   On: 02/15/2024 14:01   DG C-Arm 1-60 Min-No Report Result Date: 02/15/2024 Fluoroscopy was utilized by the requesting physician.  No radiographic interpretation.

## 2024-02-17 ENCOUNTER — Encounter: Payer: Self-pay | Admitting: Nurse Practitioner

## 2024-02-17 ENCOUNTER — Inpatient Hospital Stay (HOSPITAL_BASED_OUTPATIENT_CLINIC_OR_DEPARTMENT_OTHER): Admitting: Nurse Practitioner

## 2024-02-17 ENCOUNTER — Inpatient Hospital Stay

## 2024-02-17 ENCOUNTER — Other Ambulatory Visit (HOSPITAL_BASED_OUTPATIENT_CLINIC_OR_DEPARTMENT_OTHER): Payer: Self-pay

## 2024-02-17 ENCOUNTER — Telehealth: Payer: Self-pay

## 2024-02-17 VITALS — BP 127/64 | HR 69 | Temp 98.3°F | Resp 17 | Wt 166.0 lb

## 2024-02-17 VITALS — BP 127/64 | HR 69 | Temp 98.3°F | Resp 17 | Ht 63.0 in | Wt 166.3 lb

## 2024-02-17 DIAGNOSIS — C169 Malignant neoplasm of stomach, unspecified: Secondary | ICD-10-CM

## 2024-02-17 DIAGNOSIS — E876 Hypokalemia: Secondary | ICD-10-CM | POA: Diagnosis not present

## 2024-02-17 DIAGNOSIS — Z5111 Encounter for antineoplastic chemotherapy: Secondary | ICD-10-CM | POA: Diagnosis present

## 2024-02-17 LAB — CBC WITH DIFFERENTIAL (CANCER CENTER ONLY)
Abs Immature Granulocytes: 0.02 K/uL (ref 0.00–0.07)
Basophils Absolute: 0 K/uL (ref 0.0–0.1)
Basophils Relative: 1 %
Eosinophils Absolute: 0.1 K/uL (ref 0.0–0.5)
Eosinophils Relative: 2 %
HCT: 37.6 % (ref 36.0–46.0)
Hemoglobin: 12.6 g/dL (ref 12.0–15.0)
Immature Granulocytes: 0 %
Lymphocytes Relative: 38 %
Lymphs Abs: 3.1 K/uL (ref 0.7–4.0)
MCH: 29.3 pg (ref 26.0–34.0)
MCHC: 33.5 g/dL (ref 30.0–36.0)
MCV: 87.4 fL (ref 80.0–100.0)
Monocytes Absolute: 0.6 K/uL (ref 0.1–1.0)
Monocytes Relative: 8 %
Neutro Abs: 4.2 K/uL (ref 1.7–7.7)
Neutrophils Relative %: 51 %
Platelet Count: 367 K/uL (ref 150–400)
RBC: 4.3 MIL/uL (ref 3.87–5.11)
RDW: 14.3 % (ref 11.5–15.5)
WBC Count: 8.1 K/uL (ref 4.0–10.5)
nRBC: 0 % (ref 0.0–0.2)

## 2024-02-17 LAB — CMP (CANCER CENTER ONLY)
ALT: 21 U/L (ref 0–44)
AST: 16 U/L (ref 15–41)
Albumin: 4.3 g/dL (ref 3.5–5.0)
Alkaline Phosphatase: 73 U/L (ref 38–126)
Anion gap: 5 (ref 5–15)
BUN: 12 mg/dL (ref 6–20)
CO2: 35 mmol/L — ABNORMAL HIGH (ref 22–32)
Calcium: 9.7 mg/dL (ref 8.9–10.3)
Chloride: 105 mmol/L (ref 98–111)
Creatinine: 0.8 mg/dL (ref 0.44–1.00)
GFR, Estimated: >60 mL/min (ref >60)
Glucose, Bld: 115 mg/dL — ABNORMAL HIGH (ref 70–99)
Potassium: 3.3 mmol/L — ABNORMAL LOW (ref 3.5–5.1)
Sodium: 145 mmol/L (ref 135–145)
Total Bilirubin: 0.4 mg/dL (ref 0.0–1.2)
Total Protein: 8.4 g/dL — ABNORMAL HIGH (ref 6.5–8.1)

## 2024-02-17 LAB — GENETIC SCREENING ORDER

## 2024-02-17 MED ORDER — OXALIPLATIN CHEMO INJECTION 100 MG/20ML
130.0000 mg/m2 | Freq: Once | INTRAVENOUS | Status: AC
  Administered 2024-02-17: 250 mg via INTRAVENOUS
  Filled 2024-02-17: qty 10

## 2024-02-17 MED ORDER — POTASSIUM CHLORIDE 40 MEQ/15ML (20%) PO SOLN
20.0000 meq | Freq: Two times a day (BID) | ORAL | 1 refills | Status: DC
  Filled 2024-02-17: qty 450, 30d supply, fill #0

## 2024-02-17 MED ORDER — DEXAMETHASONE SODIUM PHOSPHATE 10 MG/ML IJ SOLN
10.0000 mg | Freq: Once | INTRAMUSCULAR | Status: AC
  Administered 2024-02-17: 10 mg via INTRAVENOUS
  Filled 2024-02-17: qty 1

## 2024-02-17 MED ORDER — DEXTROSE 5 % IV SOLN: INTRAVENOUS | Status: DC

## 2024-02-17 MED ORDER — PALONOSETRON HCL INJECTION 0.25 MG/5ML
0.2500 mg | Freq: Once | INTRAVENOUS | Status: AC
  Administered 2024-02-17: 0.25 mg via INTRAVENOUS
  Filled 2024-02-17: qty 5

## 2024-02-17 NOTE — Patient Instructions (Signed)

## 2024-02-17 NOTE — Patient Instructions (Signed)
 [facility] - A DEPT OF [facility]  Discharge Instructions: Thank you for choosing [facility] to provide your oncology and hematology care.   If you have a lab appointment with the Cancer Center, please go directly to the Cancer Center and check in at the registration area.   Wear comfortable clothing and clothing appropriate for easy access to any Portacath or PICC line.   We strive to give you quality time with your provider. You may need to reschedule your appointment if you arrive late (15 or more minutes).  Arriving late affects you and other patients whose appointments are after yours.  Also, if you miss three or more appointments without notifying the office, you may be dismissed from the clinic at the provider's discretion.      For prescription refill requests, have your pharmacy contact our office and allow 72 hours for refills to be completed.    Today you received the following chemotherapy and/or immunotherapy agents: oxaliplatin      To help prevent nausea and vomiting after your treatment, we encourage you to take your nausea medication as directed.  BELOW ARE SYMPTOMS THAT SHOULD BE REPORTED IMMEDIATELY: *FEVER GREATER THAN 100.4 F (38 C) OR HIGHER *CHILLS OR SWEATING *NAUSEA AND VOMITING THAT IS NOT CONTROLLED WITH YOUR NAUSEA MEDICATION *UNUSUAL SHORTNESS OF BREATH *UNUSUAL BRUISING OR BLEEDING *URINARY PROBLEMS (pain or burning when urinating, or frequent urination) *BOWEL PROBLEMS (unusual diarrhea, constipation, pain near the anus) TENDERNESS IN MOUTH AND THROAT WITH OR WITHOUT PRESENCE OF ULCERS (sore throat, sores in mouth, or a toothache) UNUSUAL RASH, SWELLING OR PAIN  UNUSUAL VAGINAL DISCHARGE OR ITCHING   Items with * indicate a potential emergency and should be followed up as soon as possible or go to the Emergency Department if any problems should occur.  Please show the CHEMOTHERAPY ALERT CARD or  IMMUNOTHERAPY ALERT CARD at check-in to the Emergency Department and triage nurse.  Should you have questions after your visit or need to cancel or reschedule your appointment, please contact [facility] - A DEPT OF [facility]  Dept: [phone]  and follow the prompts.  Office hours are 8:00 a.m. to 4:30 p.m. Monday - Friday. Please note that voicemails left after 4:00 p.m. may not be returned until the following business day.  We are closed weekends and major holidays. You have access to a nurse at all times for urgent questions. Please call the main number to the clinic Dept: [phone] and follow the prompts.   For any non-urgent questions, you may also contact your provider using MyChart. We now offer e-Visits for anyone 64 and older to request care online for non-urgent symptoms. For details visit [email or web address].   Also download the MyChart app! Go to the app store, search "MyChart", open the app, select [place], and log in with your MyChart username and password.

## 2024-02-17 NOTE — Transitions of Care (Post Inpatient/ED Visit) (Signed)
 02/17/2024  Patient ID: Susan Whitaker, female   DOB: 01/08/1971, 53 y.o.   MRN: [id number]  Call received from [name]. Returned call HIPAA verified. Updating this Clinical research associate on that she is starting chemotherapy today and would like to make a follow up for next Friday.  Placed on schedule. States she got her port and classes this week. She had several appointments but wanted to complete the [facility] program.  Plan:  Scheduled call for next Friday 02/24/24 at 1500  Richerd Fish, Charity fundraiser, Scientist, research (physical sciences), CCM [employer], [employer] [facility] RN Care Manager Direct Dial: [phone]

## 2024-02-19 ENCOUNTER — Ambulatory Visit: Payer: Self-pay | Admitting: Hematology

## 2024-02-20 ENCOUNTER — Telehealth: Payer: Self-pay

## 2024-02-20 NOTE — Telephone Encounter (Signed)
-----   Message from Nurse Kerri GAILS sent at 02/17/2024  2:26 PM EDT ----- Regarding: FT Chemo FENG Pt of Dr. Lanny. First time oxaliplatin. Completed treatment without incident.

## 2024-02-21 ENCOUNTER — Other Ambulatory Visit (HOSPITAL_BASED_OUTPATIENT_CLINIC_OR_DEPARTMENT_OTHER): Payer: Self-pay

## 2024-02-22 ENCOUNTER — Other Ambulatory Visit: Payer: Self-pay

## 2024-02-23 ENCOUNTER — Other Ambulatory Visit: Payer: Self-pay

## 2024-02-23 ENCOUNTER — Other Ambulatory Visit (HOSPITAL_BASED_OUTPATIENT_CLINIC_OR_DEPARTMENT_OTHER): Payer: Self-pay

## 2024-02-23 ENCOUNTER — Telehealth: Payer: Self-pay

## 2024-02-23 MED ORDER — POTASSIUM CHLORIDE CRYS ER 20 MEQ PO TBCR
20.0000 meq | EXTENDED_RELEASE_TABLET | Freq: Two times a day (BID) | ORAL | 1 refills | Status: DC
  Filled 2024-02-23: qty 60, 30d supply, fill #0

## 2024-02-23 MED ORDER — POTASSIUM CHLORIDE CRYS ER 20 MEQ PO TBCR
40.0000 meq | EXTENDED_RELEASE_TABLET | Freq: Two times a day (BID) | ORAL | 1 refills | Status: DC
  Filled 2024-02-23 (×2): qty 120, 30d supply, fill #0
  Filled 2024-04-26: qty 120, 30d supply, fill #1

## 2024-02-23 NOTE — Telephone Encounter (Signed)
 Received telephone call from the patient's daughter regarding patient's K Rx.  Caller requesting the pill form of the K instead of the liquid, states the liquid caused burning in the throat and vomiting. Pharmacy confirmed. Let Daughter know I would forward her request to the provider.

## 2024-02-24 ENCOUNTER — Other Ambulatory Visit: Payer: Self-pay

## 2024-02-24 ENCOUNTER — Telehealth: Payer: Self-pay

## 2024-02-24 NOTE — Transitions of Care (Post Inpatient/ED Visit) (Signed)
 Transition of Care Week 5 Final  Visit Note  02/24/2024  Name: Susan Whitaker MRN: [id number]          DOB: 27-May-1970  Situation: Patient enrolled in [facility] 30-day program. Visit completed with patient by telephone.   Background:   Initial Transition Care Management Follow-up Telephone Call    Past Medical History:  Diagnosis Date   Abdominal bloating 09/14/2020   Abdominal pain 10/04/2020   Allergy     Cancer (HCC) 2025   Gastric Cancer   Class 2 obesity due to excess calories with body mass index (BMI) of 35.0 to 35.9 in adult 06/2010   DM type 2 (diabetes mellitus, type 2) (HCC)    Dysrhythmia    SVT s/p ablation   Gastric ulcer    GERD (gastroesophageal reflux disease)    H/O constipation    H/O hemorrhoids    History of small bowel obstruction 06/02/2019   Hyperlipidemia    Hypertension    Hypokalemia 2019   Menorrhagia    Microalbuminuria 02/15/2012   Primary hyperaldosteronism 02/21/2012   S/P radiofrequency ablation operation for arrhythmia 10/03/20 10/04/2020   SVT (supraventricular tachycardia)    Noted 11/2011 admission   SVT (supraventricular tachycardia) 12/29/2011   Thrombocytopenia    Type 2 diabetes mellitus with obesity 11/17/2020   IMO SNOMED Dx Update Oct 2024     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    Assessment: Patient Reported Symptoms: Cognitive Cognitive Status: Able to follow simple commands, Alert and oriented to person, place, and time, Normal speech and language skills      Neurological Neurological Review of Symptoms: No symptoms reported    HEENT HEENT Symptoms Reported: No symptoms reported      Cardiovascular Cardiovascular Symptoms Reported: No symptoms reported Cardiovascular Management Strategies: Medication therapy Weight: 167 lb (75.8 kg)  Respiratory Respiratory Symptoms Reported: No symptoms reported    Endocrine Endocrine Symptoms Reported: No symptoms reported Is patient diabetic?: Yes Is patient checking blood sugars at  home?: Yes List most recent blood sugar readings, include date and time of day: 62 Endocrine Self-Management Outcome: 4 (good)  Gastrointestinal Gastrointestinal Symptoms Reported: Change in appetite, Nausea, Vomiting (at times with the liquid Potassium now taking it by mouth) Gastrointestinal Management Strategies: Adequate rest, Diet modification, Medication therapy Gastrointestinal Self-Management Outcome: 3 (uncertain) Gastrointestinal Comment: Continue to try to drink 64 ounces of fluids a day    Genitourinary Genitourinary Symptoms Reported: No symptoms reported    Integumentary Integumentary Symptoms Reported: Wound, Incision Additional Integumentary Details: healed Skin Management Strategies: Routine screening Skin Self-Management Outcome: 4 (good)  Musculoskeletal Musculoskelatal Symptoms Reviewed: Weakness, Unsteady gait        Psychosocial Psychosocial Symptoms Reported: No symptoms reported         Vitals:   02/24/24 1502  BP: 123/63    Medications Reviewed Today     Reviewed by Eilleen Richerd GRADE, RN (Registered Nurse) on 02/24/24 at 1616  Med List Status: <None>   Medication Order Taking? Sig Documenting Provider Last Dose Status Informant  acetaminophen  (TYLENOL ) 325 MG tablet [id number] Yes Take 2 tablets (650 mg total) by mouth every 4 (four) hours as needed for headache or mild pain. Marylu Leita SAUNDERS, NP  Active Self  acetaminophen  (TYLENOL ) 500 MG tablet [id number] Yes Take 2 tablets (1,000 mg total) by mouth every 8 (eight) hours. Aron Shoulders, MD  Active Self  amLODipine  (NORVASC ) 10 MG tablet [id number] Yes Take 1 tablet (10 mg total) by mouth daily. Nche, Roselie Rockford, NP  Active Self  atorvastatin  (LIPITOR) 80 MG tablet [id number] Yes Take 1 tablet (80 mg total) by mouth daily. Thapa, [place], MD  Active Self  blood glucose meter kit and supplies [id number] Yes Use in the morning, afternoon, and at bedtime. Thapa, [place], MD  Active Self  Blood Glucose  Monitoring Suppl (BLOOD GLUCOSE MONITOR SYSTEM) w/Device KIT [id number] Yes Use to check blood sugar 3 times daily. Thapa, [place], MD  Active Self  capecitabine  (XELODA ) 500 MG tablet [id number] Yes Take 3 tablets (1,500 mg total) by mouth 2 (two) times daily after a meal. Take within 30 minutes after meals. Take for 14 days on, 7 days off. Repeat every 21 days. Start on same day of iv chemo Feng, Yan, MD  Active Self  dexamethasone  (DECADRON ) 4 MG tablet [id number] Yes Take 2 tablets (8 mg total) by mouth daily. Start the day after chemotherapy for 2 days. Take with food. Lanny Callander, MD  Active Self  feeding supplement (ENSURE PLUS HIGH PROTEIN) LIQD [id number]  Take 237 mLs by mouth 2 (two) times daily between meals.  Patient not taking: Reported on 02/13/2024   Aron Shoulders, MD  Active Self  gabapentin  (NEURONTIN ) 100 MG capsule [id number] Yes Take 2 capsules (200 mg total) by mouth 3 (three) times daily. Aron Shoulders, MD  Active Self  glucose blood (ACCU-CHEK GUIDE) test strip [id number] Yes Use as instructed to check once daily. Von Pacific, MD  Active Self  Glucose Blood (BLOOD GLUCOSE TEST STRIPS) STRP [id number] Yes Use 1 each daily to test blood glucose levels. Thapa, [place], MD  Active Self  Glucose Blood (BLOOD GLUCOSE TEST STRIPS) STRP [id number] Yes Use 1 each at morning, noon, and bedtime to check blood glucose. Thapa, [place], MD  Active Self  levonorgestrel  (MIRENA , 52 MG,) 20 MCG/DAY IUD [id number] Yes Mirena  20 mcg/24 hours (7 yrs) 52 mg intrauterine device  Take by intrauterine route. [provider]  Active Self           Med Note BENJAMIN, JOSETTE NOVAK   Thu Nov 17, 2023  9:13 AM) in  lidocaine -prilocaine  (EMLA ) cream [id number] Yes Apply to affected area once Lanny Callander, MD  Active Self  methocarbamol  (ROBAXIN ) 500 MG tablet [id number] Yes Take 1 tablet (500 mg total) by mouth every 6 (six) hours as needed for muscle spasms. Aron Shoulders, MD  Active Self  ondansetron  (ZOFRAN ) 8 MG  tablet [id number] Yes Take 1 tablet (8 mg total) by mouth every 8 (eight) hours as needed for nausea or vomiting. Start on the third day after chemotherapy. Lanny Callander, MD  Active Self  ondansetron  (ZOFRAN -ODT) 4 MG disintegrating tablet [id number] Yes Dissolve 1 tablet (4 mg total) by mouth every 6 (six) hours as needed for nausea. Aron Shoulders, MD  Active Self  [facility] LANCETS FINE MISC [id number] Yes Use to check blood sugar 3 times daily Von Pacific, MD  Active Self           Med Note MARISA, NATHANEL SAILOR   Tue Mar 21, 2018  3:13 PM)    oxyCODONE  (OXY IR/ROXICODONE ) 5 MG immediate release tablet [id number] Yes Take 1-2 tablets (5-10 mg total) by mouth every 4 (four) hours as needed for moderate pain (pain score 4-6). Aron Shoulders, MD  Active   pantoprazole  (PROTONIX ) 40 MG tablet [id number] Yes Take 1 tablet (40 mg total) by mouth daily. Charlanne Groom, MD  Active Self  polyethylene glycol (MIRALAX  / GLYCOLAX ) 17 g packet [id number] Yes Take 17 g by mouth  daily. [provider]  Active Self           Med Note SOILA, LYLE BROCKS   Thu Jan 05, 2024 10:45 AM)    potassium chloride  SA (KLOR-CON  M) 20 MEQ tablet [id number] Yes Take 2 tablets (40 mEq total) by mouth 2 (two) times daily. Correction to previous prescription Lanny Callander, MD  Active   Probiotic Product [facility] IMMUNE HEALTH PO) [id number] Yes Take 1 capsule by mouth daily. [provider]  Active Self  prochlorperazine  (COMPAZINE ) 10 MG tablet [id number] Yes Take 1 tablet (10 mg total) by mouth every 6 (six) hours as needed for nausea or vomiting. Lanny Callander, MD  Active Self  Med List Note Carmella Asberry [facility] 02/10/24 1424): Xeloda  filled through [facility]            Recommendation:   Follow with Oncology Navigator and Team as patient currently did not feel she needed longitudinal Complex Care Management.  Follow Up Plan:   Closing From:  Transitions of Care Program.  Goal have been met for 30 day TOC  program  Richerd Fish, RN, Scientist, research (physical sciences), CCM [employer], [facility] RN Care Manager Direct Dial: [phone]

## 2024-02-24 NOTE — Patient Instructions (Signed)
 Visit Information  Thank you for taking time to visit with me today. Please don't hesitate to contact me if I can be of assistance to you before our next scheduled telephone appointment.  Completed 30 day TOC program.  Following is a copy of your care plan:   Goals Addressed             This Visit's Progress    COMPLETED: [facility] Transitions of Care (TOC) Care Plan   On track    Problems:  Recent Hospitalization for treatment of Cancer of gastric mass; adenocarcinoma of pyloric antrum Medication access barrier Potassium Chloride  not available at [facility] 01/27/24 Medication was received and started No PCP follow up post hospital appointment - 01/30/24 appointment made 02/03/24 Ongoing follow with specialist 02/07/24 to start chemotherapy 02/1024 Continuing 6 weeks of Chemotherapy  Goal:  Over the next 30 days, the patient will not experience hospital readmission  Interventions:   Oncology: Assessment of understanding of oncology diagnosis:  Assessed patient understanding of cancer diagnosis and recommended treatment plan Reviewed upcoming provider appointments and treatment appointments Assessed available transportation to appointments and treatments. Has consistent/reliable transportation: Yes PHQ2/PHQ9 performed 02/07/24 appointment with Oncology team, labs, and dietician  Patient Self Care Activities:  Attend all scheduled provider appointments Call pharmacy for medication refills 3-7 days in advance of running out of medications Call provider office for new concerns or questions  Notify RN Care Manager of [facility] call rescheduling needs Participate in Transition of Care Program/Attend [facility] scheduled calls Perform all self care activities independently  Take medications as prescribed   01/27/24 Patient awaiting clearance from surgeon on restarted diabetic medications, currently A1C 5.7, eating right and monitoring carbohydrates and calories.  Blood sugars reported as stable and  to follow up with Endocrinologist with blood sugar results 02/03/24 FMLA until November due to starting chemotherapy to start Plan:  The care management team will reach out to the patient again over the next 5- 10 business days. The [facility] team will follow up with the patient and will provide direct communication to the PCP for this patient.  The patient has been provided with contact information for the care management team and has been advised to call with any health related questions or concerns.  PCP follow up appointment for 01/30/24 at 11:00 am arrive at 10:45 am left a voicemail message Progressively increase exercise and tolerance, patient has connect with own PT for progression needs she says. 02/03/24 Follow up in 1 week. Call your PCP or Oncologist with any change with increased abdominal pain, change in bowels,and  not controlled by current medications. 02/24/24 Continue to receive Chemotherapy, Met 30 day TOC program and did not feel she needed longitudinal CCM at this time due to ongoing chemotherapy and interacting with medical staff as needed.        Patient verbalizes understanding of instructions and care plan provided today and agrees to view in MyChart. Active MyChart status and patient understanding of how to access instructions and care plan via MyChart confirmed with patient.     The patient has been provided with contact information for the care management team and has been advised to call with any health related questions or concerns.   Please call the care guide team at [phone] if you need to cancel or reschedule your appointment.   Please call the [place]  National Suicide Prevention Lifeline: 480-070-2763 or TTY: (804)024-7433 TTY 803-020-7229) to talk to a trained counselor call 1-800-273-TALK (toll free, 24 hour hotline)  go to [facility]gent [facility] [address], [place] [phone]) call 911 if you are experiencing a  Mental Health or Behavioral Health Crisis or need someone to talk to.  Richerd Fish, RN, BSN, CCM [facility], [facility] RN Care Manager Direct Dial: [phone]

## 2024-02-27 ENCOUNTER — Other Ambulatory Visit: Payer: Self-pay | Admitting: *Deleted

## 2024-02-27 DIAGNOSIS — C169 Malignant neoplasm of stomach, unspecified: Secondary | ICD-10-CM

## 2024-02-27 MED ORDER — SODIUM CHLORIDE 0.9 % IV SOLN: Freq: Once | INTRAVENOUS | Status: DC

## 2024-02-27 NOTE — Progress Notes (Signed)
 Pt called with c/o vomiting yesterday trying to reach hydration goals. Pt states that she can not consume the 60 oz that are recommended daily due to not being able to hold enough food or fluids. She states that she had issues with her tongue and mouth burning but that she had no concerns for that issue, she had more concerns for fluid consumption. Advised that this nurse will coordinate an appt for IVF and on next visit with provider to coordinate a plan to have IVF every other week depending on how she will be feeling at the time. Advised that appt was scheduled for 10/17 at 0730 for IVF. Pt verbalized understanding and was appreciative.

## 2024-02-29 ENCOUNTER — Other Ambulatory Visit: Payer: Self-pay | Admitting: Hematology

## 2024-02-29 ENCOUNTER — Other Ambulatory Visit: Payer: Self-pay

## 2024-02-29 ENCOUNTER — Other Ambulatory Visit (HOSPITAL_BASED_OUTPATIENT_CLINIC_OR_DEPARTMENT_OTHER): Payer: Self-pay

## 2024-02-29 DIAGNOSIS — C169 Malignant neoplasm of stomach, unspecified: Secondary | ICD-10-CM

## 2024-02-29 DIAGNOSIS — E860 Dehydration: Secondary | ICD-10-CM | POA: Insufficient documentation

## 2024-02-29 NOTE — Progress Notes (Signed)
 Verbal order w/readback from Dr. Lanny for IV NS 1L over 2hrs in Supportive Therapy plan PRN

## 2024-03-02 ENCOUNTER — Inpatient Hospital Stay

## 2024-03-02 VITALS — BP 113/61 | HR 79 | Temp 98.9°F | Resp 16

## 2024-03-02 DIAGNOSIS — E860 Dehydration: Secondary | ICD-10-CM

## 2024-03-02 DIAGNOSIS — Z5111 Encounter for antineoplastic chemotherapy: Secondary | ICD-10-CM | POA: Diagnosis not present

## 2024-03-02 DIAGNOSIS — C169 Malignant neoplasm of stomach, unspecified: Secondary | ICD-10-CM

## 2024-03-02 MED ORDER — SODIUM CHLORIDE 0.9 % IV SOLN: Freq: Once | INTRAVENOUS | Status: AC

## 2024-03-02 NOTE — Patient Instructions (Signed)

## 2024-03-07 NOTE — Assessment & Plan Note (Signed)
 [id number] stage IB - Patient has chronic stomach issue after surgery for bowel obstruction in January 2021.  Routine screening EGD in July 2025 showed a 5 cm gastric polyps in distal stomach and a biopsy showed high-grade dysplasia and a small focus of adenocarcinoma in situ.  Staging CT scan was negative.  -Status post distal partial gastrostomy on January 11, 2024, which showed 1 cm invasive well to moderately differentiated adenocarcinoma, 1 out of 20 positive lymph node, margins are clear. -given the D2 node dissection, no adjuvant radiation needed, I recommend adjuvant chemotherapy CAPOX for 6 months

## 2024-03-08 ENCOUNTER — Inpatient Hospital Stay

## 2024-03-08 ENCOUNTER — Inpatient Hospital Stay: Admitting: Nutrition

## 2024-03-08 ENCOUNTER — Inpatient Hospital Stay (HOSPITAL_BASED_OUTPATIENT_CLINIC_OR_DEPARTMENT_OTHER): Admitting: Hematology

## 2024-03-08 VITALS — BP 122/61 | HR 85 | Resp 18

## 2024-03-08 VITALS — BP 118/68 | HR 87 | Temp 98.5°F | Resp 16 | Ht 63.0 in | Wt 159.9 lb

## 2024-03-08 DIAGNOSIS — C169 Malignant neoplasm of stomach, unspecified: Secondary | ICD-10-CM | POA: Diagnosis not present

## 2024-03-08 DIAGNOSIS — Z5111 Encounter for antineoplastic chemotherapy: Secondary | ICD-10-CM | POA: Diagnosis not present

## 2024-03-08 LAB — CMP (CANCER CENTER ONLY)
ALT: 14 U/L (ref 0–44)
AST: 15 U/L (ref 15–41)
Albumin: 4 g/dL (ref 3.5–5.0)
Alkaline Phosphatase: 67 U/L (ref 38–126)
Anion gap: 8 (ref 5–15)
BUN: 10 mg/dL (ref 6–20)
CO2: 32 mmol/L (ref 22–32)
Calcium: 9.3 mg/dL (ref 8.9–10.3)
Chloride: 104 mmol/L (ref 98–111)
Creatinine: 0.77 mg/dL (ref 0.44–1.00)
GFR, Estimated: >60 mL/min (ref >60)
Glucose, Bld: 116 mg/dL — ABNORMAL HIGH (ref 70–99)
Potassium: 2.8 mmol/L — ABNORMAL LOW (ref 3.5–5.1)
Sodium: 144 mmol/L (ref 135–145)
Total Bilirubin: 0.5 mg/dL (ref 0.0–1.2)
Total Protein: 7.7 g/dL (ref 6.5–8.1)

## 2024-03-08 LAB — CBC WITH DIFFERENTIAL (CANCER CENTER ONLY)
Abs Immature Granulocytes: 0.01 K/uL (ref 0.00–0.07)
Basophils Absolute: 0 K/uL (ref 0.0–0.1)
Basophils Relative: 0 %
Eosinophils Absolute: 0.1 K/uL (ref 0.0–0.5)
Eosinophils Relative: 2 %
HCT: 34.7 % — ABNORMAL LOW (ref 36.0–46.0)
Hemoglobin: 12 g/dL (ref 12.0–15.0)
Immature Granulocytes: 0 %
Lymphocytes Relative: 48 %
Lymphs Abs: 2.4 K/uL (ref 0.7–4.0)
MCH: 29.3 pg (ref 26.0–34.0)
MCHC: 34.6 g/dL (ref 30.0–36.0)
MCV: 84.8 fL (ref 80.0–100.0)
Monocytes Absolute: 0.7 K/uL (ref 0.1–1.0)
Monocytes Relative: 14 %
Neutro Abs: 1.8 K/uL (ref 1.7–7.7)
Neutrophils Relative %: 36 %
Platelet Count: 396 K/uL (ref 150–400)
RBC: 4.09 MIL/uL (ref 3.87–5.11)
RDW: 13.9 % (ref 11.5–15.5)
WBC Count: 5.1 K/uL (ref 4.0–10.5)
nRBC: 0 % (ref 0.0–0.2)

## 2024-03-08 MED ORDER — OXALIPLATIN CHEMO INJECTION 100 MG/20ML
130.0000 mg/m2 | Freq: Once | INTRAVENOUS | Status: AC
  Administered 2024-03-08: 250 mg via INTRAVENOUS
  Filled 2024-03-08: qty 10

## 2024-03-08 MED ORDER — DEXTROSE 5 % IV SOLN: INTRAVENOUS | Status: DC

## 2024-03-08 MED ORDER — SODIUM CHLORIDE 0.9% FLUSH
10.0000 mL | INTRAVENOUS | Status: DC | PRN
  Administered 2024-03-08: 10 mL

## 2024-03-08 MED ORDER — PALONOSETRON HCL INJECTION 0.25 MG/5ML
0.2500 mg | Freq: Once | INTRAVENOUS | Status: AC
  Administered 2024-03-08: 0.25 mg via INTRAVENOUS
  Filled 2024-03-08: qty 5

## 2024-03-08 MED ORDER — DEXAMETHASONE SOD PHOSPHATE PF 10 MG/ML IJ SOLN
10.0000 mg | Freq: Once | INTRAMUSCULAR | Status: AC
  Administered 2024-03-08: 10 mg via INTRAVENOUS

## 2024-03-08 NOTE — Progress Notes (Signed)
 Nutrition follow-up completed with patient and daughter during infusion for cancer of the antrum stomach status post distal partial gastrectomy in August 2025.  Patient is followed by Dr. Lanny.  She is receiving adjuvant chemotherapy CapeOx every 3 weeks.  Weight: 159.9 pounds October 23 167 pounds September 30 167 pounds August 27  Labs include potassium 2.8 and glucose 116  Patient had a difficult time eating the week of treatment.  She has a very poor appetite during that time and finds it difficult to eat although she tries.  She has been eating smaller amounts more often and has been limiting liquids to 4 ounces at a time.  She purchased a 4 ounce bottle so that she is sure not to drink more than she should.  She confirms she experiences dumping syndrome if she is not compliant.  Tolerating Ensure Plus or equivalent 4 ounces at a time and has been consuming once daily.  Reports she had not been taking her potassium supplement but now understands how important it is.  Nutrition diagnosis:  Food and nutrition related knowledge deficit, improved  Intervention: Continue strategies for small amounts of food more often throughout the day. Focus on higher calorie higher protein foods when able. Limit liquids at mealtimes to no more than 4 ounces. Drink 4 ounces of Ensure Plus or equivalent 3 times daily between meals. Discussed strategies to ease swallowing of potassium tablets. Provided support and encouragement.  Monitoring, evaluation, goals: Patient will tolerate increased calories and protein to minimize further weight loss.  Next visit: Friday, November 14 during infusion.  **Disclaimer: This note was dictated with voice recognition software. Similar sounding words can inadvertently be transcribed and this note may contain transcription errors which may not have been corrected upon publication of note.**

## 2024-03-08 NOTE — Progress Notes (Signed)
 [facility]   [phone] Fax:([phone]   Clinic Follow up Note   Patient Care Team: Nche, Roselie Rockford, NP as PCP - General (Internal Medicine) Waddell Danelle ORN, MD as PCP - Electrophysiology (Cardiology) Verlin Lonni BIRCH, MD as PCP - Cardiology (Cardiology) Prescilla Beams, MD as Consulting Physician (Nephrology) Waddell Danelle ORN, MD as Consulting Physician (Cardiology) Gloriann Chick, MD as Consulting Physician (Obstetrics and Gynecology) Camillo Golas, MD as Attending Physician (Ophthalmology) Lanny Callander, MD as Consulting Physician (Hematology and Oncology)  Date of Service:  03/08/2024  CHIEF COMPLAINT: f/u of gastric cancer  CURRENT THERAPY:  Adjuvant chemotherapy CapeOx every 3 weeks  Oncology History   Gastric adenocarcinoma [facility]) pT1bN1M0 stage IB - Patient has chronic stomach issue after surgery for bowel obstruction in January 2021.  Routine screening EGD in July 2025 showed a 5 cm gastric polyps in distal stomach and a biopsy showed high-grade dysplasia and a small focus of adenocarcinoma in situ.  Staging CT scan was negative.  -Status post distal partial gastrostomy on January 11, 2024, which showed 1 cm invasive well to moderately differentiated adenocarcinoma, 1 out of 20 positive lymph node, margins are clear. -given the D2 node dissection, no adjuvant radiation needed, I recommend adjuvant chemotherapy CAPOX for 6 months  Assessment & Plan Gastric cancer Undergoing adjuvant chemotherapy with regimen BKpox. Experiences significant weakness and fatigue, particularly on the second or third day post-chemotherapy, lasting about a week. No diarrhea reported. Manages oral chemotherapy despite side effects. Cold sensitivity during chemotherapy days. Blood counts are normal. Kidney and liver function tests are pending but expected before treatment. Considering switch to pump infusion due to side effects, involving a lower dose of  oxaliplatin every two weeks instead of a higher dose every three weeks. The pump infusion requires carrying a pump and bag for 46 hours, with potential risks of needle dislodgement or pump malfunction, though rare. - Continue current chemotherapy regimen BKpox. - Schedule next chemotherapy sessions for November 13, December 4, and potentially adjust December 25 session for holiday. - Monitor blood counts and kidney/liver function before each treatment. - Discuss potential switch to pump infusion if current regimen becomes unmanageable. - Ensure adequate supply of antiemetics and topical cream for cold sensitivity. - Schedule follow-up appointment in three weeks.  Hypokalemia - Worse today, 2.8, she has not been taking oral potassium for the past 3 to 4 days - Will give IV potassium 10 or 20 mill equal today, and let her restart oral potassium 40 mill or equal twice daily for 5 days then 20meq bid  - We discussed the option of switching to FOLFOX if she does not tolerate well, she will let me know in the next few weeks - Up in 3 weeks before C3 chemo    SUMMARY OF ONCOLOGIC HISTORY: Oncology History  Gastric adenocarcinoma (HCC)  11/30/2023 Initial Diagnosis   Gastric adenocarcinoma (HCC)   01/11/2024 Cancer Staging   Staging form: Stomach, AJCC 8th Edition - Pathologic stage from 01/11/2024: Stage IB (pT1b, pN1, cM0) - Signed by Lanny Callander, MD on 01/25/2024 Histologic grade (G): G2 Histologic grading system: 3 grade system Residual tumor (R): R0   02/17/2024 -  Chemotherapy   Patient is on Treatment Plan : GASTRIC CapeOx (1000/130) q21d x 8 Cycles         Discussed the use of AI scribe software for clinical note transcription with the patient, who gave verbal consent to proceed.  History of Present Illness [facility]  is a 53 year old female with gastric cancer who presents for follow-up and adjuvant chemotherapy.  She is undergoing adjuvant chemotherapy with BKpox and experiences  significant weakness, particularly on the second or third day post-treatment, lasting about a week. During this time, she is limited to basic activities.  She continues her oral chemotherapy regimen, taking six pills daily, with a scheduled seven-day break from October 18 to March 10, 2024.  She experiences cold sensitivity following chemotherapy, affecting her ability to handle metal objects. A cream effectively manages this sensitivity in her hands and feet.  She has no diarrhea or mouth problems and has sufficient nausea medication.     All other systems were reviewed with the patient and are negative.  MEDICAL HISTORY:  Past Medical History:  Diagnosis Date   Abdominal bloating 09/14/2020   Abdominal pain 10/04/2020   Allergy     Cancer (HCC) 2025   Gastric Cancer   Class 2 obesity due to excess calories with body mass index (BMI) of 35.0 to 35.9 in adult 06/2010   DM type 2 (diabetes mellitus, type 2) (HCC)    Dysrhythmia    SVT s/p ablation   Gastric ulcer    GERD (gastroesophageal reflux disease)    H/O constipation    H/O hemorrhoids    History of small bowel obstruction 06/02/2019   Hyperlipidemia    Hypertension    Hypokalemia 2019   Menorrhagia    Microalbuminuria 02/15/2012   Primary hyperaldosteronism 02/21/2012   S/P radiofrequency ablation operation for arrhythmia 10/03/20 10/04/2020   SVT (supraventricular tachycardia)    Noted 11/2011 admission   SVT (supraventricular tachycardia) 12/29/2011   Thrombocytopenia    Type 2 diabetes mellitus with obesity 11/17/2020   IMO SNOMED Dx Update Oct 2024     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    SURGICAL HISTORY: Past Surgical History:  Procedure Laterality Date   ABDOMINAL ADHESION SURGERY  06/03/2019   Dr Curvin   ABDOMINAL SURGERY     65 months of age-- unsure of type of surgery   COLONOSCOPY  04/2016   hemorrhoids--normal per pt with Dr Kristie   DILATATION & CURETTAGE/HYSTEROSCOPY WITH MYOSURE N/A 02/08/2020    Procedure: DILATATION & CURETTAGE/HYSTEROSCOPY WITH MYOSURE;  Surgeon: Gloriann Chick, MD;  Location: [facility];  Service: Gynecology;  Laterality: N/A;   ESOPHAGOGASTRODUODENOSCOPY N/A 12/26/2023   Procedure: EGD (ESOPHAGOGASTRODUODENOSCOPY);  Surgeon: Wilhelmenia Aloha Raddle., MD;  Location: [facility];  Service: Gastroenterology;  Laterality: N/A;   EUS N/A 12/26/2023   Procedure: ULTRASOUND, UPPER GI TRACT, ENDOSCOPIC;  Surgeon: Wilhelmenia Aloha Raddle., MD;  Location: [facility];  Service: Gastroenterology;  Laterality: N/A;   HEMORRHOID SURGERY  2005/2006   INTRAUTERINE DEVICE (IUD) INSERTION N/A 02/08/2020   Procedure: INTRAUTERINE DEVICE (IUD) INSERTION UNDER ULTRASOUND GUIDANCE;  Surgeon: Gloriann Chick, MD;  [facility];  Service: Gynecology;  Laterality: N/A;  Mirena    LAPAROSCOPY N/A 01/11/2024   Procedure: LAPAROSCOPY, DIAGNOSTIC;  Surgeon: Aron Shoulders, MD;  Location: [facility];  Service: General;  Laterality: N/A;   LAPAROTOMY N/A 06/03/2019   Procedure: EXPLORATORY LAPAROTOMY WITH LYSIS OF ADHESIONS;  Surgeon: Curvin Deward MOULD, MD;  Location: [facility];  Service: General;  Laterality: N/A;   PARTIAL GASTRECTOMY N/A 01/11/2024   Procedure: GASTRECTOMY, PARTIAL;  Surgeon: Aron Shoulders, MD;  Location: [facility];  Service: General;  Laterality: N/A;  DISTAL GASTRECTOMY   PORTACATH PLACEMENT N/A 02/15/2024   Procedure: INSERTION, TUNNELED CENTRAL VENOUS DEVICE, WITH PORT;  Surgeon: Aron Shoulders,  MD;  Location: [facility];  Service: General;  Laterality: N/A;   SVT ABLATION N/A 10/03/2020   Procedure: SVT ABLATION;  Surgeon: Waddell Danelle ORN, MD;  Location: [facility];  Service: Cardiovascular;  Laterality: N/A;   UPPER GASTROINTESTINAL ENDOSCOPY     UTERINE FIBROID EMBOLIZATION  2010   at [facility]    I have reviewed the social history and family history with the patient and they are unchanged from previous note.  ALLERGIES:  is allergic to  lisinopril.  MEDICATIONS:  Current Outpatient Medications  Medication Sig Dispense Refill   acetaminophen  (TYLENOL ) 325 MG tablet Take 2 tablets (650 mg total) by mouth every 4 (four) hours as needed for headache or mild pain.     acetaminophen  (TYLENOL ) 500 MG tablet Take 2 tablets (1,000 mg total) by mouth every 8 (eight) hours. 30 tablet 0   amLODipine  (NORVASC ) 10 MG tablet Take 1 tablet (10 mg total) by mouth daily. 90 tablet 3   atorvastatin  (LIPITOR) 80 MG tablet Take 1 tablet (80 mg total) by mouth daily. 90 tablet 3   blood glucose meter kit and supplies Use in the morning, afternoon, and at bedtime. 1 each 0   Blood Glucose Monitoring Suppl (BLOOD GLUCOSE MONITOR SYSTEM) w/Device KIT Use to check blood sugar 3 times daily. 1 kit 0   capecitabine  (XELODA ) 500 MG tablet TAKE 3 TABLETS BY MOUTH TWICE  DAILY WITHIN 30 MINUTES OF A  MEAL FOR 14 DAYS ON THEN 7 DAYS  OFF AS DIRECTED 84 tablet 0   dexamethasone  (DECADRON ) 4 MG tablet Take 2 tablets (8 mg total) by mouth daily. Start the day after chemotherapy for 2 days. Take with food. 30 tablet 1   feeding supplement (ENSURE PLUS HIGH PROTEIN) LIQD Take 237 mLs by mouth 2 (two) times daily between meals. (Patient not taking: Reported on 02/13/2024)     gabapentin  (NEURONTIN ) 100 MG capsule Take 2 capsules (200 mg total) by mouth 3 (three) times daily. 180 capsule 0   glucose blood (ACCU-CHEK GUIDE) test strip Use as instructed to check once daily. 50 each 12   Glucose Blood (BLOOD GLUCOSE TEST STRIPS) STRP Use 1 each daily to test blood glucose levels. 100 each 3   Glucose Blood (BLOOD GLUCOSE TEST STRIPS) STRP Use 1 each at morning, noon, and bedtime to check blood glucose. 100 each 3   levonorgestrel  (MIRENA , 52 MG,) 20 MCG/DAY IUD Mirena  20 mcg/24 hours (7 yrs) 52 mg intrauterine device  Take by intrauterine route.     lidocaine -prilocaine  (EMLA ) cream Apply to affected area once 30 g 3   methocarbamol  (ROBAXIN ) 500 MG tablet Take 1 tablet  (500 mg total) by mouth every 6 (six) hours as needed for muscle spasms. 90 tablet 1   ondansetron  (ZOFRAN ) 8 MG tablet Take 1 tablet (8 mg total) by mouth every 8 (eight) hours as needed for nausea or vomiting. Start on the third day after chemotherapy. 30 tablet 1   ondansetron  (ZOFRAN -ODT) 4 MG disintegrating tablet Dissolve 1 tablet (4 mg total) by mouth every 6 (six) hours as needed for nausea. 20 tablet 2   ONETOUCH DELICA LANCETS FINE MISC Use to check blood sugar 3 times daily 100 each 3   oxyCODONE  (OXY IR/ROXICODONE ) 5 MG immediate release tablet Take 1-2 tablets (5-10 mg total) by mouth every 4 (four) hours as needed for moderate pain (pain score 4-6). 40 tablet 0   pantoprazole  (PROTONIX ) 40 MG tablet Take 1 tablet (40 mg  total) by mouth daily. 90 tablet 3   polyethylene glycol (MIRALAX  / GLYCOLAX ) 17 g packet Take 17 g by mouth daily.     potassium chloride  SA (KLOR-CON  M) 20 MEQ tablet Take 2 tablets (40 mEq total) by mouth 2 (two) times daily. Correction to previous prescription 120 tablet 1   Probiotic Product (ULTRAFLORA IMMUNE HEALTH PO) Take 1 capsule by mouth daily.     prochlorperazine  (COMPAZINE ) 10 MG tablet Take 1 tablet (10 mg total) by mouth every 6 (six) hours as needed for nausea or vomiting. 30 tablet 1   No current facility-administered medications for this visit.   Facility-Administered Medications Ordered in Other Visits  Medication Dose Route Frequency Provider Last Rate Last Admin   dexamethasone  (DECADRON ) injection 10 mg  10 mg Intravenous Once Lanny Callander, MD       dextrose  5 % solution   Intravenous Continuous Lanny Callander, MD 10 mL/hr at 03/08/24 1119 New Bag at 03/08/24 1119   oxaliplatin (ELOXATIN) 250 mg in dextrose  5 % 500 mL chemo infusion  130 mg/m2 (Treatment Plan Recorded) Intravenous Once Lanny Callander, MD       palonosetron (ALOXI) injection 0.25 mg  0.25 mg Intravenous Once Lanny Callander, MD       sodium chloride  flush (NS) 0.9 % injection 10 mL  10 mL  Intracatheter PRN Lanny Callander, MD        PHYSICAL EXAMINATION: ECOG PERFORMANCE STATUS: 1 - Symptomatic but completely ambulatory  Vitals:   03/08/24 1010  BP: 118/68  Pulse: 87  Resp: 16  Temp: 98.5 F (36.9 C)  SpO2: 99%   Wt Readings from Last 3 Encounters:  03/08/24 159 lb 14.4 oz (72.5 kg)  02/24/24 167 lb (75.8 kg)  02/17/24 166 lb (75.3 kg)     GENERAL:alert, no distress and comfortable SKIN: skin color, texture, turgor are normal, no rashes or significant lesions EYES: normal, Conjunctiva are pink and non-injected, sclera clear NECK: supple, thyroid  normal size, non-tender, without nodularity LYMPH:  no palpable lymphadenopathy in the cervical, axillary  LUNGS: clear to auscultation and percussion with normal breathing effort HEART: regular rate & rhythm and no murmurs and no lower extremity edema ABDOMEN:abdomen soft, non-tender and normal bowel sounds Musculoskeletal:no cyanosis of digits and no clubbing  NEURO: alert & oriented x 3 with fluent speech, no focal motor/sensory deficits  Physical Exam    LABORATORY DATA:  I have reviewed the data as listed    Latest Ref Rng & Units 03/08/2024    9:28 AM 02/17/2024   10:09 AM 02/07/2024    9:00 AM  CBC  WBC 4.0 - 10.5 K/uL 5.1  8.1  6.3   Hemoglobin 12.0 - 15.0 g/dL 87.9  87.3  86.8   Hematocrit 36.0 - 46.0 % 34.7  37.6  39.0   Platelets 150 - 400 K/uL 396  367  448         Latest Ref Rng & Units 03/08/2024    9:28 AM 02/17/2024   10:09 AM 02/07/2024    9:00 AM  CMP  Glucose 70 - 99 mg/dL 883  884  898   BUN 6 - 20 mg/dL 10  12  17    Creatinine 0.44 - 1.00 mg/dL 9.22  9.19  9.19   Sodium 135 - 145 mmol/L 144  145  142   Potassium 3.5 - 5.1 mmol/L 2.8  3.3  3.5   Chloride 98 - 111 mmol/L 104  105  105   CO2  22 - 32 mmol/L 32  35  32   Calcium  8.9 - 10.3 mg/dL 9.3  9.7  9.2   Total Protein 6.5 - 8.1 g/dL 7.7  8.4  7.9   Total Bilirubin 0.0 - 1.2 mg/dL 0.5  0.4  0.5   Alkaline Phos 38 - 126 U/L 67  73   62   AST 15 - 41 U/L 15  16  19    ALT 0 - 44 U/L 14  21  35       RADIOGRAPHIC STUDIES: I have personally reviewed the radiological images as listed and agreed with the findings in the report. No results found.    Orders Placed This Encounter  Procedures   CBC with Differential ([facility])    Standing Status:   Future    Expected Date:   03/29/2024    Expiration Date:   03/29/2025   CMP ([facility])    Standing Status:   Future    Expected Date:   03/29/2024    Expiration Date:   03/29/2025   CBC with Differential ([facility])    Standing Status:   Future    Expected Date:   04/19/2024    Expiration Date:   04/19/2025   CMP ([facility])    Standing Status:   Future    Expected Date:   04/19/2024    Expiration Date:   04/19/2025   All questions were answered. The patient knows to call the clinic with any problems, questions or concerns. No barriers to learning was detected. The total time spent in the appointment was 25 minutes, including review of chart and various tests results, discussions about plan of care and coordination of care plan     Onita Mattock, MD 03/08/2024

## 2024-03-08 NOTE — Patient Instructions (Addendum)
 [facility] - A DEPT OF [facility]  Discharge Instructions:  ABOUT POTASSIUM: Please let her know that she can break the tablets in half and swallow each half separately with a glass of water . Alternatively, whole tablets can be dissolved in 4 ounces of water . She needs to allow 2 minutes for them to dissolve, and then stir for half minute. Swirl and drink all the liquid right away. Please tell her to take 40meq bid for 5 days then change to 20meq bid.  Thank you for choosing [facility] to provide your oncology and hematology care.   If you have a lab appointment with the Cancer Center, please go directly to the Cancer Center and check in at the registration area.   Wear comfortable clothing and clothing appropriate for easy access to any Portacath or PICC line.   We strive to give you quality time with your provider. You may need to reschedule your appointment if you arrive late (15 or more minutes).  Arriving late affects you and other patients whose appointments are after yours.  Also, if you miss three or more appointments without notifying the office, you may be dismissed from the clinic at the provider's discretion.      For prescription refill requests, have your pharmacy contact our office and allow 72 hours for refills to be completed.    Today you received the following chemotherapy and/or immunotherapy agents: Oxaliplatin      To help prevent nausea and vomiting after your treatment, we encourage you to take your nausea medication as directed.  BELOW ARE SYMPTOMS THAT SHOULD BE REPORTED IMMEDIATELY: *FEVER GREATER THAN 100.4 F (38 C) OR HIGHER *CHILLS OR SWEATING *NAUSEA AND VOMITING THAT IS NOT CONTROLLED WITH YOUR NAUSEA MEDICATION *UNUSUAL SHORTNESS OF BREATH *UNUSUAL BRUISING OR BLEEDING *URINARY PROBLEMS (pain or burning when urinating, or frequent urination) *BOWEL PROBLEMS (unusual diarrhea, constipation, pain near the  anus) TENDERNESS IN MOUTH AND THROAT WITH OR WITHOUT PRESENCE OF ULCERS (sore throat, sores in mouth, or a toothache) UNUSUAL RASH, SWELLING OR PAIN  UNUSUAL VAGINAL DISCHARGE OR ITCHING   Items with * indicate a potential emergency and should be followed up as soon as possible or go to the Emergency Department if any problems should occur.  Please show the CHEMOTHERAPY ALERT CARD or IMMUNOTHERAPY ALERT CARD at check-in to the Emergency Department and triage nurse.  Should you have questions after your visit or need to cancel or reschedule your appointment, please contact [facility] - A DEPT OF [facility]  Dept: [phone]  and follow the prompts.  Office hours are 8:00 a.m. to 4:30 p.m. Monday - Friday. Please note that voicemails left after 4:00 p.m. may not be returned until the following business day.  We are closed weekends and major holidays. You have access to a nurse at all times for urgent questions. Please call the main number to the clinic Dept: [phone] and follow the prompts.   For any non-urgent questions, you may also contact your provider using MyChart. We now offer e-Visits for anyone 27 and older to request care online for non-urgent symptoms. For details visit [email or web address].   Also download the MyChart app! Go to the app store, search MyChart, open the app, select [place], and log in with your MyChart username and password.

## 2024-03-12 LAB — SIGNATERA
SIGNATERA MTM READOUT: 0 MTM/ml
SIGNATERA TEST RESULT: NEGATIVE

## 2024-03-13 ENCOUNTER — Encounter (HOSPITAL_COMMUNITY): Payer: Self-pay

## 2024-03-19 ENCOUNTER — Emergency Department (HOSPITAL_BASED_OUTPATIENT_CLINIC_OR_DEPARTMENT_OTHER)
Admission: EM | Admit: 2024-03-19 | Discharge: 2024-03-19 | Disposition: A | Discharge status: Home / Self Care | Attending: Emergency Medicine | Admitting: Emergency Medicine

## 2024-03-19 ENCOUNTER — Encounter: Payer: Self-pay | Admitting: Nurse Practitioner

## 2024-03-19 ENCOUNTER — Telehealth: Payer: Self-pay

## 2024-03-19 ENCOUNTER — Encounter (HOSPITAL_BASED_OUTPATIENT_CLINIC_OR_DEPARTMENT_OTHER): Payer: Self-pay | Admitting: Emergency Medicine

## 2024-03-19 ENCOUNTER — Other Ambulatory Visit (HOSPITAL_BASED_OUTPATIENT_CLINIC_OR_DEPARTMENT_OTHER): Payer: Self-pay

## 2024-03-19 ENCOUNTER — Other Ambulatory Visit: Payer: Self-pay

## 2024-03-19 ENCOUNTER — Ambulatory Visit: Admitting: Nurse Practitioner

## 2024-03-19 VITALS — BP 112/76 | HR 96 | Temp 97.6°F | Ht 63.0 in | Wt 152.6 lb

## 2024-03-19 DIAGNOSIS — I10 Essential (primary) hypertension: Secondary | ICD-10-CM

## 2024-03-19 DIAGNOSIS — Z0001 Encounter for general adult medical examination with abnormal findings: Secondary | ICD-10-CM

## 2024-03-19 DIAGNOSIS — E1169 Type 2 diabetes mellitus with other specified complication: Secondary | ICD-10-CM | POA: Diagnosis not present

## 2024-03-19 DIAGNOSIS — R1110 Vomiting, unspecified: Secondary | ICD-10-CM | POA: Diagnosis present

## 2024-03-19 DIAGNOSIS — E440 Moderate protein-calorie malnutrition: Secondary | ICD-10-CM

## 2024-03-19 DIAGNOSIS — E785 Hyperlipidemia, unspecified: Secondary | ICD-10-CM

## 2024-03-19 DIAGNOSIS — Z85028 Personal history of other malignant neoplasm of stomach: Secondary | ICD-10-CM | POA: Insufficient documentation

## 2024-03-19 LAB — URINALYSIS, ROUTINE W REFLEX MICROSCOPIC
Bilirubin Urine: NEGATIVE
Glucose, UA: NEGATIVE mg/dL
Ketones, ur: NEGATIVE mg/dL
Leukocytes,Ua: NEGATIVE
Nitrite: NEGATIVE
Protein, ur: NEGATIVE mg/dL
Specific Gravity, Urine: 1.02 (ref 1.005–1.030)
pH: 6 (ref 5.0–8.0)

## 2024-03-19 LAB — COMPREHENSIVE METABOLIC PANEL WITH GFR
ALT: 18 U/L (ref 0–44)
AST: 23 U/L (ref 15–41)
Albumin: 4.3 g/dL (ref 3.5–5.0)
Alkaline Phosphatase: 79 U/L (ref 38–126)
Anion gap: 14 (ref 5–15)
BUN: 12 mg/dL (ref 6–20)
CO2: 23 mmol/L (ref 22–32)
Calcium: 9.6 mg/dL (ref 8.9–10.3)
Chloride: 104 mmol/L (ref 98–111)
Creatinine, Ser: 0.64 mg/dL (ref 0.44–1.00)
GFR, Estimated: >60 mL/min (ref >60)
Glucose, Bld: 118 mg/dL — ABNORMAL HIGH (ref 70–99)
Potassium: 3.6 mmol/L (ref 3.5–5.1)
Sodium: 140 mmol/L (ref 135–145)
Total Bilirubin: 0.6 mg/dL (ref 0.0–1.2)
Total Protein: 7.7 g/dL (ref 6.5–8.1)

## 2024-03-19 LAB — URINALYSIS, MICROSCOPIC (REFLEX)

## 2024-03-19 LAB — CBC
HCT: 35.7 % — ABNORMAL LOW (ref 36.0–46.0)
Hemoglobin: 12.2 g/dL (ref 12.0–15.0)
MCH: 29.7 pg (ref 26.0–34.0)
MCHC: 34.2 g/dL (ref 30.0–36.0)
MCV: 86.9 fL (ref 80.0–100.0)
Platelets: 182 K/uL (ref 150–400)
RBC: 4.11 MIL/uL (ref 3.87–5.11)
RDW: 14.1 % (ref 11.5–15.5)
WBC: 6.2 K/uL (ref 4.0–10.5)
nRBC: 0 % (ref 0.0–0.2)

## 2024-03-19 LAB — LIPASE, BLOOD: Lipase: 28 U/L (ref 11–51)

## 2024-03-19 MED ORDER — ONDANSETRON HCL 4 MG/2ML IJ SOLN
4.0000 mg | Freq: Once | INTRAMUSCULAR | Status: AC
  Administered 2024-03-19: 4 mg via INTRAVENOUS
  Filled 2024-03-19: qty 2

## 2024-03-19 MED ORDER — SODIUM CHLORIDE 0.9 % IV BOLUS
1000.0000 mL | Freq: Once | INTRAVENOUS | Status: AC
  Administered 2024-03-19: 1000 mL via INTRAVENOUS

## 2024-03-19 MED ORDER — AMLODIPINE BESYLATE 5 MG PO TABS
5.0000 mg | ORAL_TABLET | Freq: Every day | ORAL | 1 refills | Status: DC
  Filled 2024-03-19: qty 30, 30d supply, fill #0
  Filled 2024-04-15: qty 30, 30d supply, fill #1
  Filled 2024-04-20 – 2024-06-01 (×2): qty 30, 30d supply, fill #2

## 2024-03-19 NOTE — Telephone Encounter (Signed)
 Pt and daughter called stating that they would like to change her chemo regimen from CapeOX  to FOLFOX per their last office conversation with Dr. Lanny.  Pt stated the last time they were in clinic with Dr. Lanny she stated that since the pt is having difficulty with taking the Capecitabine  pill she could switch to 5FU.  Pt and daughter have decided they would like to do the 5FU.  Stated this nurse will notify Dr. Lanny and her Team of the pt's decision.

## 2024-03-19 NOTE — Progress Notes (Signed)
 Complete physical exam  Patient: Susan Whitaker   DOB: [birth year]   53 y.o. Female  MRN: [id number] Visit Date: 03/19/2024  Subjective:    Chief Complaint  Patient presents with   Annual Exam    FASTING    Accompanied by her daughter.  Susan Whitaker is a 53 y.o. female who presents today for a complete physical exam. She reports consuming a general diet. Limited due to generalized weakness secondary to chemotherpay side effects. She generally feels poorly. She reports sleeping fairly well. She does have additional problems to discuss today.  Vision:No Dental:No STD Screen:No  BP Readings from Last 3 Encounters:  03/19/24 112/76  03/08/24 122/61  03/08/24 118/68   Wt Readings from Last 3 Encounters:  03/19/24 152 lb 9.6 oz (69.2 kg)  03/08/24 159 lb 14.4 oz (72.5 kg)  02/24/24 167 lb (75.8 kg)   Most recent fall risk assessment:    01/27/2024   10:05 AM  Fall Risk   Falls in the past year? 0   Depression screen:Yes - No Depression Most recent depression screenings:    03/02/2024    7:50 AM 02/24/2024    3:02 PM  PHQ 2/9 Scores  PHQ - 2 Score 0 0    HPI  HTN (hypertension) Low BP with limited oral hydration and nutrition with ongoing cancer treatment. BP Readings from Last 3 Encounters:  03/19/24 112/76  03/08/24 122/61  03/08/24 118/68    Decrease amlodipine  dose to 5mg  daily Advised to Check BP at home in AM, 2-3x/week. Call office if BP >140/80  or <100/70 for more than 2days Bring BP readings and machine to next appointment. F/up in 3months or sooner  Hyperlipidemia associated with type 2 diabetes mellitus (HCC) Current use of atorvastatin  80mg  Repeat lipid panel Consider decreasing dose since poor nutrition secondary to chemotherapy  Moderate protein-calorie malnutrition Lost 23lbs in last 3months secondary to chemotherapy side effects (anorexia, nausea, and metallic taste) Wt Readings from Last 3 Encounters:  03/19/24 152 lb 9.6 oz (69.2 kg)   03/08/24 159 lb 14.4 oz (72.5 kg)  02/24/24 167 lb (75.8 kg)    Encouraged to eat small frequent meals, add protein 1-2protein drinks daily, and use zofran  of compazine  for nausea.   Past Medical History:  Diagnosis Date   Abdominal bloating 09/14/2020   Abdominal pain 10/04/2020   Allergy     Cancer (HCC) 2025   Gastric Cancer   Class 2 obesity due to excess calories with body mass index (BMI) of 35.0 to 35.9 in adult 06/2010   DM type 2 (diabetes mellitus, type 2) (HCC)    Dysrhythmia    SVT s/p ablation   Gastric ulcer    GERD (gastroesophageal reflux disease)    H/O constipation    H/O hemorrhoids    History of small bowel obstruction 06/02/2019   Hyperlipidemia    Hypertension    Hypokalemia 2019   Menorrhagia    Microalbuminuria 02/15/2012   Primary hyperaldosteronism 02/21/2012   S/P radiofrequency ablation operation for arrhythmia 10/03/20 10/04/2020   SVT (supraventricular tachycardia)    Noted 11/2011 admission   SVT (supraventricular tachycardia) 12/29/2011   Thrombocytopenia    Type 2 diabetes mellitus with obesity 11/17/2020   IMO SNOMED Dx Update Oct 2024     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   Past Surgical History:  Procedure Laterality Date   ABDOMINAL ADHESION SURGERY  06/03/2019   Dr Curvin   ABDOMINAL SURGERY     62 months of age--  unsure of type of surgery   COLONOSCOPY  04/2016   hemorrhoids--normal per pt with Dr Kristie   DILATATION & CURETTAGE/HYSTEROSCOPY WITH MYOSURE N/A 02/08/2020   Procedure: DILATATION & CURETTAGE/HYSTEROSCOPY WITH MYOSURE;  Surgeon: Gloriann Chick, MD;  Location: [facility];  Service: Gynecology;  Laterality: N/A;   ESOPHAGOGASTRODUODENOSCOPY N/A 12/26/2023   Procedure: EGD (ESOPHAGOGASTRODUODENOSCOPY);  Surgeon: Wilhelmenia Aloha Raddle., MD;  Location: [facility];  Service: Gastroenterology;  Laterality: N/A;   EUS N/A 12/26/2023   Procedure: ULTRASOUND, UPPER GI TRACT, ENDOSCOPIC;  Surgeon: Wilhelmenia Aloha Raddle., MD;   Location: [facility];  Service: Gastroenterology;  Laterality: N/A;   HEMORRHOID SURGERY  2005/2006   INTRAUTERINE DEVICE (IUD) INSERTION N/A 02/08/2020   Procedure: INTRAUTERINE DEVICE (IUD) INSERTION UNDER ULTRASOUND GUIDANCE;  Surgeon: Gloriann Chick, MD;  Location: [facility];  Service: Gynecology;  Laterality: N/A;  Mirena    LAPAROSCOPY N/A 01/11/2024   Procedure: LAPAROSCOPY, DIAGNOSTIC;  Surgeon: Aron Shoulders, MD;  Location: [facility];  Service: General;  Laterality: N/A;   LAPAROTOMY N/A 06/03/2019   Procedure: EXPLORATORY LAPAROTOMY WITH LYSIS OF ADHESIONS;  Surgeon: Curvin Deward MOULD, MD;  Location: [facility];  Service: General;  Laterality: N/A;   PARTIAL GASTRECTOMY N/A 01/11/2024   Procedure: GASTRECTOMY, PARTIAL;  Surgeon: Aron Shoulders, MD;  Location: [facility];  Service: General;  Laterality: N/A;  DISTAL GASTRECTOMY   PORTACATH PLACEMENT N/A 02/15/2024   Procedure: INSERTION, TUNNELED CENTRAL VENOUS DEVICE, WITH PORT;  Surgeon: Aron Shoulders, MD;  Location: [facility];  Service: General;  Laterality: N/A;   SVT ABLATION N/A 10/03/2020   Procedure: SVT ABLATION;  Surgeon: Waddell Danelle ORN, MD;  Location: [facility];  Service: Cardiovascular;  Laterality: N/A;   UPPER GASTROINTESTINAL ENDOSCOPY     UTERINE FIBROID EMBOLIZATION  2010   at [facility]   Social History   Socioeconomic History   Marital status: Single    Spouse name: Not on file   Number of children: 2   Years of education: Not on file   Highest education level: Not on file  Occupational History   Occupation: YOUTH EMPLOYMENT    Employer: [employer]   Occupation: egineering  Tobacco Use   Smoking status: Never   Smokeless tobacco: Never  Vaping Use   Vaping status: Never Used  Substance and Sexual Activity   Alcohol  use: No   Drug use: No   Sexual activity: Yes    Partners: Male    Birth control/protection: I.U.D.  Other Topics Concern   Not on file  Social History Narrative   Works at [employer]         Social Drivers of Corporate Investment Banker Strain: Not on file  Food Insecurity: No Food Insecurity (01/27/2024)   Hunger Vital Sign    Worried About Running Out of Food in the Last Year: Never true    Ran Out of Food in the Last Year: Never true  Recent Concern: Food Insecurity - Food Insecurity Present (01/11/2024)   Hunger Vital Sign    Worried About Running Out of Food in the Last Year: Sometimes true    Ran Out of Food in the Last Year: Sometimes true  Transportation Needs: No Transportation Needs (01/27/2024)   PRAPARE - Administrator, Civil Service (Medical): No    Lack of Transportation (Non-Medical): No  Physical Activity: Not on file  Stress: Not on file  Social Connections: Not on file  Intimate Partner Violence: Not  At Risk (01/27/2024)   Humiliation, Afraid, Rape, and Kick questionnaire    Fear of Current or Ex-Partner: No    Emotionally Abused: No    Physically Abused: No    Sexually Abused: No   Family Status  Relation Name Status   Mother Maurilio Grand Deceased   Father Janella Rogala Alive   Sister [place] Alive   Sister tara Alive   Brother charles Alive   Brother [place] Alive   Mat Aunt  Alive   MGM Maurilio Search [place] Deceased   Other  (Not Specified)   Neg Hx  (Not Specified)  No partnership data on file   Family History  Problem Relation Age of Onset   Cancer Mother 33       breast   Stroke Mother    Vision loss Father        some   Heart disease Father        Rhythm disturbance   Hypertension Sister    Hypertension Brother    Hypertension Brother    Cancer Maternal Aunt 51       Breast   Cancer Maternal Grandmother 29       breast   Hypertension Maternal Grandmother    Cancer Other        breast, lung   Hypertension Other    Stroke Other    Diabetes Neg Hx    Colon cancer Neg Hx    Esophageal cancer Neg Hx    Stomach cancer Neg Hx    Rectal cancer Neg Hx    Allergies  Allergen Reactions   Lisinopril  Cough    Patient Care Team: Ahman Dugdale, Roselie Rockford, NP as PCP - General (Internal Medicine) Waddell Danelle ORN, MD as PCP - Electrophysiology (Cardiology) Verlin Lonni BIRCH, MD as PCP - Cardiology (Cardiology) Prescilla Beams, MD as Consulting Physician (Nephrology) Waddell Danelle ORN, MD as Consulting Physician (Cardiology) Gloriann Chick, MD as Consulting Physician (Obstetrics and Gynecology) Camillo Golas, MD as Attending Physician (Ophthalmology) Lanny Callander, MD as Consulting Physician (Hematology and Oncology)   Medications: Outpatient Medications Prior to Visit  Medication Sig   acetaminophen  (TYLENOL ) 325 MG tablet Take 2 tablets (650 mg total) by mouth every 4 (four) hours as needed for headache or mild pain.   acetaminophen  (TYLENOL ) 500 MG tablet Take 2 tablets (1,000 mg total) by mouth every 8 (eight) hours.   atorvastatin  (LIPITOR) 80 MG tablet Take 1 tablet (80 mg total) by mouth daily.   blood glucose meter kit and supplies Use in the morning, afternoon, and at bedtime.   Blood Glucose Monitoring Suppl (BLOOD GLUCOSE MONITOR SYSTEM) w/Device KIT Use to check blood sugar 3 times daily.   capecitabine  (XELODA ) 500 MG tablet TAKE 3 TABLETS BY MOUTH TWICE  DAILY WITHIN 30 MINUTES OF A  MEAL FOR 14 DAYS ON THEN 7 DAYS  OFF AS DIRECTED   dexamethasone  (DECADRON ) 4 MG tablet Take 2 tablets (8 mg total) by mouth daily. Start the day after chemotherapy for 2 days. Take with food.   feeding supplement (ENSURE PLUS HIGH PROTEIN) LIQD Take 237 mLs by mouth 2 (two) times daily between meals.   gabapentin  (NEURONTIN ) 100 MG capsule Take 2 capsules (200 mg total) by mouth 3 (three) times daily.   glucose blood (ACCU-CHEK GUIDE) test strip Use as instructed to check once daily.   Glucose Blood (BLOOD GLUCOSE TEST STRIPS) STRP Use 1 each daily to test blood glucose levels.   Glucose Blood (BLOOD GLUCOSE TEST  STRIPS) STRP Use 1 each at morning, noon, and bedtime to check blood glucose.    levonorgestrel  (MIRENA , 52 MG,) 20 MCG/DAY IUD Mirena  20 mcg/24 hours (7 yrs) 52 mg intrauterine device  Take by intrauterine route.   lidocaine -prilocaine  (EMLA ) cream Apply to affected area once   methocarbamol  (ROBAXIN ) 500 MG tablet Take 1 tablet (500 mg total) by mouth every 6 (six) hours as needed for muscle spasms.   ondansetron  (ZOFRAN ) 8 MG tablet Take 1 tablet (8 mg total) by mouth every 8 (eight) hours as needed for nausea or vomiting. Start on the third day after chemotherapy.   ondansetron  (ZOFRAN -ODT) 4 MG disintegrating tablet Dissolve 1 tablet (4 mg total) by mouth every 6 (six) hours as needed for nausea.   ONETOUCH DELICA LANCETS FINE MISC Use to check blood sugar 3 times daily   oxyCODONE  (OXY IR/ROXICODONE ) 5 MG immediate release tablet Take 1-2 tablets (5-10 mg total) by mouth every 4 (four) hours as needed for moderate pain (pain score 4-6).   pantoprazole  (PROTONIX ) 40 MG tablet Take 1 tablet (40 mg total) by mouth daily.   polyethylene glycol (MIRALAX  / GLYCOLAX ) 17 g packet Take 17 g by mouth daily.   potassium chloride  SA (KLOR-CON  M) 20 MEQ tablet Take 2 tablets (40 mEq total) by mouth 2 (two) times daily. Correction to previous prescription   Probiotic Product (ULTRAFLORA IMMUNE HEALTH PO) Take 1 capsule by mouth daily.   prochlorperazine  (COMPAZINE ) 10 MG tablet Take 1 tablet (10 mg total) by mouth every 6 (six) hours as needed for nausea or vomiting.   [DISCONTINUED] amLODipine  (NORVASC ) 10 MG tablet Take 1 tablet (10 mg total) by mouth daily.   No facility-administered medications prior to visit.    Review of Systems      Objective:  BP 112/76 (BP Location: Left Arm, Patient Position: Sitting, Cuff Size: Large)   Pulse 96   Temp 97.6 F (36.4 C) (Temporal)   Ht 5' 3 (1.6 m)   Wt 152 lb 9.6 oz (69.2 kg)   LMP  (LMP Unknown)   SpO2 98%   BMI 27.03 kg/m     Physical Exam Cardiovascular:     Rate and Rhythm: Normal rate and regular rhythm.     Pulses:  Normal pulses.     Heart sounds: Normal heart sounds.  Pulmonary:     Effort: Pulmonary effort is normal.     Breath sounds: Normal breath sounds.  Musculoskeletal:     Right lower leg: No edema.     Left lower leg: No edema.  Neurological:     Mental Status: She is alert and oriented to person, place, and time.  Psychiatric:        Attention and Perception: Attention normal.        Mood and Affect: Mood normal. Affect is flat.        Speech: Speech normal.        Behavior: Behavior normal.        Thought Content: Thought content normal.        Judgment: Judgment normal.      No results found for any visits on 03/19/24.    Assessment & Plan:    Routine Health Maintenance and Physical Exam  Immunization History  Administered Date(s) Administered   Fluad Quad(high Dose 65+) 01/22/2022   Fluad Trivalent(High Dose 65+) 03/16/2023   Hepatitis A, Adult 12/06/2022, 08/24/2023   Hepb-cpg 12/28/2020, 02/28/2021   INFLUENZA, HIGH DOSE SEASONAL PF 01/30/2024  Influenza Split 02/14/2012   Influenza,inj,Quad PF,6+ Mos 03/04/2014, 01/29/2015, 03/24/2016, 01/24/2017, 04/11/2019, 04/28/2020   Influenza-Unspecified 02/14/2013, 04/28/2020, 02/14/2021, 01/22/2022   PFIZER(Purple Top)SARS-COV-2 Vaccination 10/10/2019, 11/02/2019, 12/28/2020   PNEUMOCOCCAL CONJUGATE-20 12/28/2020   Pneumococcal Polysaccharide-23 02/14/2012, 07/25/2017   Td 01/03/1984   Tdap 10/28/2007, 07/14/2012, 12/28/2020, 03/16/2023   Zoster Recombinant(Shingrix ) 04/11/2019, 04/28/2020, 07/08/2021   Zoster, Live 04/11/2019, 04/28/2020    Health Maintenance  Topic Date Due   Mammogram  05/20/2023   Cervical Cancer Screening (HPV/Pap Cotest)  03/17/2024   COVID-19 Vaccine (4 - 2025-26 season) 05/16/2024 (Originally 01/16/2024)   OPHTHALMOLOGY EXAM  05/08/2024   FOOT EXAM  05/19/2024   HEMOGLOBIN A1C  07/22/2024   Diabetic kidney evaluation - Urine ACR  09/13/2024   Diabetic kidney evaluation - eGFR measurement   03/08/2025   Colonoscopy  05/18/2027   DTaP/Tdap/Td (6 - Td or Tdap) 03/15/2033   Pneumococcal Vaccine: 50+ Years  Completed   Influenza Vaccine  Completed   Hepatitis B Vaccines 19-59 Average Risk  Completed   Hepatitis C Screening  Completed   HIV Screening  Completed   Zoster Vaccines- Shingrix   Completed   HPV VACCINES  Aged Out   Meningococcal B Vaccine  Aged Out    Discussed health benefits of physical activity, and encouraged her to engage in regular exercise appropriate for her age and condition.  Problem List Items Addressed This Visit     HTN (hypertension)   Low BP with limited oral hydration and nutrition with ongoing cancer treatment. BP Readings from Last 3 Encounters:  03/19/24 112/76  03/08/24 122/61  03/08/24 118/68    Decrease amlodipine  dose to 5mg  daily Advised to Check BP at home in AM, 2-3x/week. Call office if BP >140/80  or <100/70 for more than 2days Bring BP readings and machine to next appointment. F/up in 3months or sooner      Relevant Medications   amLODipine  (NORVASC ) 5 MG tablet   Hyperlipidemia associated with type 2 diabetes mellitus (HCC)   Current use of atorvastatin  80mg  Repeat lipid panel Consider decreasing dose since poor nutrition secondary to chemotherapy      Relevant Medications   amLODipine  (NORVASC ) 5 MG tablet   Other Relevant Orders   Lipid panel   Moderate protein-calorie malnutrition   Lost 23lbs in last 3months secondary to chemotherapy side effects (anorexia, nausea, and metallic taste) Wt Readings from Last 3 Encounters:  03/19/24 152 lb 9.6 oz (69.2 kg)  03/08/24 159 lb 14.4 oz (72.5 kg)  02/24/24 167 lb (75.8 kg)    Encouraged to eat small frequent meals, add protein 1-2protein drinks daily, and use zofran  of compazine  for nausea.      Other Visit Diagnoses       Encounter for preventative adult health care exam with abnormal findings    -  Primary      Return in about 3 months (around 06/19/2024) for  HTN, hyperlipidemia (fasting), DM.     Roselie Mood, NP

## 2024-03-19 NOTE — ED Triage Notes (Signed)
 Pt reports coffee ground emesis x 2 today. Denies diarrhea, fever.   Pt undergoes chemo infusion, last tx 10/23.

## 2024-03-19 NOTE — ED Provider Notes (Signed)
 [place] [facility] AT [facility] Provider Note   CSN: [id number] Arrival date & time: 03/19/24  1508     Patient presents with: Emesis   Susan Whitaker is a 53 y.o. female.   53 yo F with a complaints of brownish vomit.  She tells me that she normally throws up and usually it is clear.  She has a history of gastric cancer and is status post resection.  She has eat very small meals and sometimes has episodes of emesis.  She has felt at her baseline.  No fevers or chills.  No abdominal pain.  No diarrhea.  No dark or bloody stool.  She was trying to describe it to her oncologist on the phone and told them it looked a little bit like coffee and they told her to come immediately to the emergency department for evaluation.  She denies feeling lightheaded or dizzy.    Emesis      Prior to Admission medications   Medication Sig Start Date End Date Taking? Authorizing Provider  acetaminophen  (TYLENOL ) 325 MG tablet Take 2 tablets (650 mg total) by mouth every 4 (four) hours as needed for headache or mild pain. 10/04/20   Marylu Leita SAUNDERS, NP  acetaminophen  (TYLENOL ) 500 MG tablet Take 2 tablets (1,000 mg total) by mouth every 8 (eight) hours. 01/19/24   Aron Shoulders, MD  amLODipine  (NORVASC ) 5 MG tablet Take 1 tablet (5 mg total) by mouth daily. 03/19/24   Nche, Roselie Rockford, NP  atorvastatin  (LIPITOR) 80 MG tablet Take 1 tablet (80 mg total) by mouth daily. 09/21/23   Thapa, Sudan, MD  blood glucose meter kit and supplies Use in the morning, afternoon, and at bedtime. 05/20/23   Thapa, Sudan, MD  Blood Glucose Monitoring Suppl (BLOOD GLUCOSE MONITOR SYSTEM) w/Device KIT Use to check blood sugar 3 times daily. 01/23/24   Thapa, Sudan, MD  capecitabine  (XELODA ) 500 MG tablet TAKE 3 TABLETS BY MOUTH TWICE  DAILY WITHIN 30 MINUTES OF A  MEAL FOR 14 DAYS ON THEN 7 DAYS  OFF AS DIRECTED 02/29/24   Hanford Powell BRAVO, NP  dexamethasone  (DECADRON ) 4 MG tablet Take 2 tablets (8 mg  total) by mouth daily. Start the day after chemotherapy for 2 days. Take with food. 02/10/24   Lanny Callander, MD  feeding supplement (ENSURE PLUS HIGH PROTEIN) LIQD Take 237 mLs by mouth 2 (two) times daily between meals. 01/19/24   Aron Shoulders, MD  gabapentin  (NEURONTIN ) 100 MG capsule Take 2 capsules (200 mg total) by mouth 3 (three) times daily. 01/19/24   Aron Shoulders, MD  glucose blood (ACCU-CHEK GUIDE) test strip Use as instructed to check once daily. 10/20/21   Von [place], MD  Glucose Blood (BLOOD GLUCOSE TEST STRIPS) STRP Use 1 each daily to test blood glucose levels. 05/20/23 05/19/24  Thapa, Sudan, MD  Glucose Blood (BLOOD GLUCOSE TEST STRIPS) STRP Use 1 each at morning, noon, and bedtime to check blood glucose. 01/23/24 06/04/24  Thapa, Sudan, MD  levonorgestrel  (MIRENA , 52 MG,) 20 MCG/DAY IUD Mirena  20 mcg/24 hours (7 yrs) 52 mg intrauterine device  Take by intrauterine route.    [provider]  lidocaine -prilocaine  (EMLA ) cream Apply to affected area once 02/10/24   Lanny Callander, MD  methocarbamol  (ROBAXIN ) 500 MG tablet Take 1 tablet (500 mg total) by mouth every 6 (six) hours as needed for muscle spasms. 01/19/24   Aron Shoulders, MD  ondansetron  (ZOFRAN ) 8 MG tablet Take 1 tablet (8  mg total) by mouth every 8 (eight) hours as needed for nausea or vomiting. Start on the third day after chemotherapy. 02/10/24   Lanny Callander, MD  ondansetron  (ZOFRAN -ODT) 4 MG disintegrating tablet Dissolve 1 tablet (4 mg total) by mouth every 6 (six) hours as needed for nausea. 01/19/24   Aron Shoulders, MD  [facility] DELICA LANCETS FINE MISC Use to check blood sugar 3 times daily 09/28/16   Von [place], MD  oxyCODONE  (OXY IR/ROXICODONE ) 5 MG immediate release tablet Take 1-2 tablets (5-10 mg total) by mouth every 4 (four) hours as needed for moderate pain (pain score 4-6). 02/15/24   Aron Shoulders, MD  pantoprazole  (PROTONIX ) 40 MG tablet Take 1 tablet (40 mg total) by mouth daily. 08/16/23   Charlanne Groom, MD  polyethylene  glycol (MIRALAX  / GLYCOLAX ) 17 g packet Take 17 g by mouth daily.    [provider]  potassium chloride  SA (KLOR-CON  M) 20 MEQ tablet Take 2 tablets (40 mEq total) by mouth 2 (two) times daily. Correction to previous prescription 02/23/24   Lanny Callander, MD  Probiotic Product [facility] IMMUNE HEALTH PO) Take 1 capsule by mouth daily.    [provider]  prochlorperazine  (COMPAZINE ) 10 MG tablet Take 1 tablet (10 mg total) by mouth every 6 (six) hours as needed for nausea or vomiting. 02/10/24   Lanny Callander, MD    Allergies: Lisinopril    Review of Systems  Gastrointestinal:  Positive for vomiting.    Updated Vital Signs BP 112/60   Pulse 75   Temp 99 F (37.2 C) (Oral)   Resp 18   Ht 5' 3 (1.6 m)   Wt 69.2 kg   LMP  (LMP Unknown)   SpO2 98%   BMI 27.03 kg/m   Physical Exam Vitals and nursing note reviewed.  Constitutional:      General: She is not in acute distress.    Appearance: She is well-developed. She is not diaphoretic.  HENT:     Head: Normocephalic and atraumatic.  Eyes:     Pupils: Pupils are equal, round, and reactive to light.  Cardiovascular:     Rate and Rhythm: Normal rate and regular rhythm.     Heart sounds: No murmur heard.    No friction rub. No gallop.  Pulmonary:     Effort: Pulmonary effort is normal.     Breath sounds: No wheezing or rales.  Abdominal:     General: There is no distension.     Palpations: Abdomen is soft.     Tenderness: There is no abdominal tenderness.  Musculoskeletal:        General: No tenderness.     Cervical back: Normal range of motion and neck supple.  Skin:    General: Skin is warm and dry.  Neurological:     Mental Status: She is alert and oriented to person, place, and time.  Psychiatric:        Behavior: Behavior normal.     (all labs ordered are listed, but only abnormal results are displayed) Labs Reviewed  COMPREHENSIVE METABOLIC PANEL WITH GFR - Abnormal; Notable for the following  components:      Result Value   Glucose, Bld 118 (*)    All other components within normal limits  CBC - Abnormal; Notable for the following components:   HCT 35.7 (*)    All other components within normal limits  LIPASE, BLOOD  URINALYSIS, ROUTINE W REFLEX MICROSCOPIC    EKG: None  Radiology:  No results found.   Procedures   Medications Ordered in the ED  sodium chloride  0.9 % bolus 1,000 mL (0 mLs Intravenous Stopped 03/19/24 1654)  ondansetron  (ZOFRAN ) injection 4 mg (4 mg Intravenous Given 03/19/24 1558)                                    Medical Decision Making Amount and/or Complexity of Data Reviewed Labs: ordered.  Risk Prescription drug management.   53 yo F with a significant past medical history of gastric cancer s/p resection and the chief complaints of brownish emesis.  Typically vomits but not normally brown.  Described it as coffee colored, does not sound like it was the specific description that was normally given for coffee ground emesis.  No abdominal pain.  Will check hemoglobin here.  Fluids.  Antiemetics  Patient feeling a bit better on repeat assessment.  LFTs and lipase are unremarkable.  Hemoglobin is stable.  I think this is less likely to be upper GI bleeding.  Patient continues to feel well.  Tolerated by mouth.  Will discharge home.  PCP and oncology follow-up.  6:19 PM:  I have discussed the diagnosis/risks/treatment options with the patient and family.  Evaluation and diagnostic testing in the emergency department does not suggest an emergent condition requiring admission or immediate intervention beyond what has been performed at this time.  They will follow up with PCP. We also discussed returning to the ED immediately if new or worsening sx occur. We discussed the sx which are most concerning (e.g., sudden worsening pain, fever, inability to tolerate by mouth) that necessitate immediate return. Medications administered to the patient during  their visit and any new prescriptions provided to the patient are listed below.  Medications given during this visit Medications  sodium chloride  0.9 % bolus 1,000 mL (0 mLs Intravenous Stopped 03/19/24 1654)  ondansetron  (ZOFRAN ) injection 4 mg (4 mg Intravenous Given 03/19/24 1558)     The patient appears reasonably screen and/or stabilized for discharge and I doubt any other medical condition or other [facility] requiring further screening, evaluation, or treatment in the ED at this time prior to discharge.       Final diagnoses:  Vomiting in adult    ED Discharge Orders     None          Emil Share, DO 03/19/24 1819

## 2024-03-19 NOTE — Assessment & Plan Note (Signed)
 Current use of atorvastatin  80mg  Repeat lipid panel Consider decreasing dose since poor nutrition secondary to chemotherapy

## 2024-03-19 NOTE — Telephone Encounter (Signed)
 Pt and pt's daughter called back stating that the pt has had 2 to 3 episodes of vomiting brown coffee ground emesis today.  Pt and daughter wanted to know what to do regarding these symptoms.  Instructed pt and daughter to go to the ER/ED for further evaluation.  Pt's daughter stated they will go to [facility].  Notified Dr. Lanny and Team of the pt's call.

## 2024-03-19 NOTE — Assessment & Plan Note (Signed)
 Low BP with limited oral hydration and nutrition with ongoing cancer treatment. BP Readings from Last 3 Encounters:  03/19/24 112/76  03/08/24 122/61  03/08/24 118/68    Decrease amlodipine  dose to 5mg  daily Advised to Check BP at home in AM, 2-3x/week. Call office if BP >140/80  or <100/70 for more than 2days Bring BP readings and machine to next appointment. F/up in 3months or sooner

## 2024-03-19 NOTE — Discharge Instructions (Signed)
 I think it is less likely you are bleeding from your stomach based on your story and based on the lab results.  Certainly this is not definitive, please return for continued dark emesis or dark or bloody stools.  Please return for fatigue dizziness or if you feel like you might pass out.  Please let your oncologist and your PCP know.

## 2024-03-19 NOTE — Assessment & Plan Note (Addendum)
 Lost 23lbs in last 3months secondary to chemotherapy side effects (anorexia, nausea, and metallic taste) Wt Readings from Last 3 Encounters:  03/19/24 152 lb 9.6 oz (69.2 kg)  03/08/24 159 lb 14.4 oz (72.5 kg)  02/24/24 167 lb (75.8 kg)    Encouraged to eat small frequent meals, add protein 1-2protein drinks daily, and use zofran  of compazine  for nausea.

## 2024-03-19 NOTE — Patient Instructions (Addendum)
 Add ensure plus or fairlife protein shake 1-2bottles daily. Decrease amlodipine  to 5mg  daily Check BP at home in AM, 2-3x/week. Call office if BP >140/80  or <100/70 for more than 2days Bring BP readings and machine to next appointment.  Please have cancer center to draw lipid panel

## 2024-03-20 ENCOUNTER — Other Ambulatory Visit: Payer: Self-pay

## 2024-03-23 ENCOUNTER — Other Ambulatory Visit: Payer: Self-pay | Admitting: Nurse Practitioner

## 2024-03-23 DIAGNOSIS — C169 Malignant neoplasm of stomach, unspecified: Secondary | ICD-10-CM

## 2024-03-26 ENCOUNTER — Other Ambulatory Visit: Payer: Self-pay | Admitting: Hematology

## 2024-03-26 DIAGNOSIS — C169 Malignant neoplasm of stomach, unspecified: Secondary | ICD-10-CM

## 2024-03-26 NOTE — Progress Notes (Addendum)
 Pharmacist Chemotherapy Monitoring - Initial Assessment    Anticipated start date: 04/02/24    The following has been reviewed per standard work regarding the patient's treatment regimen: The patient's diagnosis, treatment plan and drug doses, and organ/hematologic function Lab orders and baseline tests specific to treatment regimen  The treatment plan start date, drug sequencing, and pre-medications Prior authorization status  Patient's documented medication list, including drug-drug interaction screen and prescriptions for anti-emetics and supportive care specific to the treatment regimen The drug concentrations, fluid compatibility, administration routes, and timing of the medications to be used The patient's access for treatment and lifetime cumulative dose history, if applicable  The patient's medication allergies and previous infusion related reactions, if applicable   Changes made to treatment plan:  treatment plan date  Follow up needed:  Confirm patient has compazine  rx for home use. Remove Capecitabine  from medication list  Bridgett Leotis Helling, RPH, BCPS, BCOP 03/26/2024   10:50 AM

## 2024-03-29 ENCOUNTER — Inpatient Hospital Stay: Admitting: Nurse Practitioner

## 2024-03-30 ENCOUNTER — Inpatient Hospital Stay

## 2024-03-30 ENCOUNTER — Inpatient Hospital Stay: Admitting: Nurse Practitioner

## 2024-03-30 ENCOUNTER — Other Ambulatory Visit (HOSPITAL_BASED_OUTPATIENT_CLINIC_OR_DEPARTMENT_OTHER): Payer: Self-pay

## 2024-03-30 ENCOUNTER — Inpatient Hospital Stay: Attending: Hematology | Admitting: Nutrition

## 2024-03-30 ENCOUNTER — Telehealth: Payer: Self-pay | Admitting: Hematology

## 2024-03-30 DIAGNOSIS — Z5111 Encounter for antineoplastic chemotherapy: Secondary | ICD-10-CM | POA: Insufficient documentation

## 2024-03-30 DIAGNOSIS — D75839 Thrombocytosis, unspecified: Secondary | ICD-10-CM

## 2024-03-30 DIAGNOSIS — R110 Nausea: Secondary | ICD-10-CM | POA: Diagnosis not present

## 2024-03-30 DIAGNOSIS — D6481 Anemia due to antineoplastic chemotherapy: Secondary | ICD-10-CM | POA: Insufficient documentation

## 2024-03-30 DIAGNOSIS — E876 Hypokalemia: Secondary | ICD-10-CM | POA: Diagnosis not present

## 2024-03-30 DIAGNOSIS — R630 Anorexia: Secondary | ICD-10-CM | POA: Insufficient documentation

## 2024-03-30 DIAGNOSIS — T451X5A Adverse effect of antineoplastic and immunosuppressive drugs, initial encounter: Secondary | ICD-10-CM | POA: Diagnosis not present

## 2024-03-30 DIAGNOSIS — C169 Malignant neoplasm of stomach, unspecified: Secondary | ICD-10-CM | POA: Insufficient documentation

## 2024-03-30 LAB — CBC WITH DIFFERENTIAL (CANCER CENTER ONLY)
Abs Immature Granulocytes: 0.01 K/uL (ref 0.00–0.07)
Basophils Absolute: 0 K/uL (ref 0.0–0.1)
Basophils Relative: 0 %
Eosinophils Absolute: 0.1 K/uL (ref 0.0–0.5)
Eosinophils Relative: 2 %
HCT: 31.7 % — ABNORMAL LOW (ref 36.0–46.0)
Hemoglobin: 10.7 g/dL — ABNORMAL LOW (ref 12.0–15.0)
Immature Granulocytes: 0 %
Lymphocytes Relative: 50 %
Lymphs Abs: 2.6 K/uL (ref 0.7–4.0)
MCH: 29.4 pg (ref 26.0–34.0)
MCHC: 33.8 g/dL (ref 30.0–36.0)
MCV: 87.1 fL (ref 80.0–100.0)
Monocytes Absolute: 0.6 K/uL (ref 0.1–1.0)
Monocytes Relative: 11 %
Neutro Abs: 2 K/uL (ref 1.7–7.7)
Neutrophils Relative %: 37 %
Platelet Count: 241 K/uL (ref 150–400)
RBC: 3.64 MIL/uL — ABNORMAL LOW (ref 3.87–5.11)
RDW: 15.5 % (ref 11.5–15.5)
WBC Count: 5.4 K/uL (ref 4.0–10.5)
nRBC: 0 % (ref 0.0–0.2)

## 2024-03-30 LAB — CMP (CANCER CENTER ONLY)
ALT: 14 U/L (ref 0–44)
AST: 17 U/L (ref 15–41)
Albumin: 4.2 g/dL (ref 3.5–5.0)
Alkaline Phosphatase: 60 U/L (ref 38–126)
Anion gap: 5 (ref 5–15)
BUN: 14 mg/dL (ref 6–20)
CO2: 30 mmol/L (ref 22–32)
Calcium: 9.3 mg/dL (ref 8.9–10.3)
Chloride: 107 mmol/L (ref 98–111)
Creatinine: 0.73 mg/dL (ref 0.44–1.00)
GFR, Estimated: >60 mL/min (ref >60)
Glucose, Bld: 130 mg/dL — ABNORMAL HIGH (ref 70–99)
Potassium: 3.4 mmol/L — ABNORMAL LOW (ref 3.5–5.1)
Sodium: 142 mmol/L (ref 135–145)
Total Bilirubin: 0.6 mg/dL (ref 0.0–1.2)
Total Protein: 7.1 g/dL (ref 6.5–8.1)

## 2024-03-30 MED ORDER — PROCHLORPERAZINE MALEATE 10 MG PO TABS
10.0000 mg | ORAL_TABLET | Freq: Four times a day (QID) | ORAL | 1 refills | Status: DC | PRN
  Filled 2024-03-30: qty 30, 8d supply, fill #0

## 2024-03-30 MED ORDER — ONDANSETRON HCL 8 MG PO TABS
8.0000 mg | ORAL_TABLET | Freq: Three times a day (TID) | ORAL | 1 refills | Status: DC | PRN
  Filled 2024-03-30: qty 30, 10d supply, fill #0

## 2024-03-30 MED ORDER — DEXAMETHASONE 4 MG PO TABS
8.0000 mg | ORAL_TABLET | Freq: Every day | ORAL | 1 refills | Status: DC
  Filled 2024-03-30: qty 30, 15d supply, fill #0

## 2024-03-30 MED ORDER — LIDOCAINE-PRILOCAINE 2.5-2.5 % EX CREA
TOPICAL_CREAM | CUTANEOUS | 3 refills | Status: DC
  Filled 2024-03-30: qty 30, 30d supply, fill #0

## 2024-03-30 NOTE — Telephone Encounter (Signed)
 Called  LVM  regarindg pt  Tx

## 2024-03-30 NOTE — Progress Notes (Signed)
 Nutrition follow-up completed with patient and her 2 daughters.  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  She receives adjuvant chemotherapy CapeOx every 3 weeks.  She is followed by Dr. Lanny.  Weight: 151.2 pounds November 14 152.6 pounds November 3 159.9 pounds October 23 167 pounds September 30  9% weight loss in less than 3 months.  Labs include potassium 3.4, glucose 130.  Patient relayed information regarding her recent trip to the ED.  Reports she experienced coffee ground colored emesis.  She was given IV fluids and Zofran  and was able to discharge home.  Reports significant increase in appetite and energy after IV fluids.  She has been able to eat better.  She is drinking 4 ounces of Ensure Plus 2 times a day.  Denies recent dumping syndrome.  Has occasional nausea which improves with antiemetics.  Has experienced some constipation after taking nausea medication.  She is trying to drink adequate fluids.  Verbalizes she is not taking her potassium supplements religiously.  Nutrition diagnosis: Food and nutrition related knowledge deficit, improved.  Intervention: Continue strategies for small frequent meals and snacks throughout the day.  Limit liquids at mealtimes to no more than 4 ounces. Continue 4 ounces Ensure Plus or equivalent at least twice daily.  Provided samples of boost VHC.  Instructed her to try 4 ounces twice a day and evaluate tolerance. Reviewed bowel regimen. Education provided on taste alterations.  Encouraged baking soda and salt water  gargle.  Provided information on MetaQil along with ordering information and coupon. Nutrition facts sheets were given.  Monitoring, evaluation, goals: Patient will tolerate adequate calories and protein to minimize weight loss.  Next visit: Monday, November 24 during infusion with Joli.  **Disclaimer: This note was dictated with voice recognition  software. Similar sounding words can inadvertently be transcribed and this note may contain transcription errors which may not have been corrected upon publication of note.**

## 2024-04-01 NOTE — Assessment & Plan Note (Signed)
 [id number] stage IB - Patient has chronic stomach issue after surgery for bowel obstruction in January 2021.  Routine screening EGD in July 2025 showed a 5 cm gastric polyps in distal stomach and a biopsy showed high-grade dysplasia and a small focus of adenocarcinoma in situ.  Staging CT scan was negative.  -Status post distal partial gastrostomy on January 11, 2024, which showed 1 cm invasive well to moderately differentiated adenocarcinoma, 1 out of 20 positive lymph node, margins are clear. -given the D2 node dissection, no adjuvant radiation needed, I recommend adjuvant chemotherapy CAPOX for 6 months

## 2024-04-02 ENCOUNTER — Inpatient Hospital Stay: Admitting: Hematology

## 2024-04-02 ENCOUNTER — Inpatient Hospital Stay

## 2024-04-02 VITALS — BP 120/75 | HR 98 | Temp 97.8°F | Resp 15 | Ht 63.0 in | Wt 153.4 lb

## 2024-04-02 DIAGNOSIS — C169 Malignant neoplasm of stomach, unspecified: Secondary | ICD-10-CM | POA: Diagnosis not present

## 2024-04-02 DIAGNOSIS — Z5111 Encounter for antineoplastic chemotherapy: Secondary | ICD-10-CM | POA: Diagnosis not present

## 2024-04-02 MED ORDER — FLUOROURACIL CHEMO INJECTION 2.5 GM/50ML
400.0000 mg/m2 | Freq: Once | INTRAVENOUS | Status: AC
  Administered 2024-04-02: 700 mg via INTRAVENOUS
  Filled 2024-04-02: qty 14

## 2024-04-02 MED ORDER — SODIUM CHLORIDE 0.9 % IV SOLN
2400.0000 mg/m2 | INTRAVENOUS | Status: DC
  Administered 2024-04-02: 4200 mg via INTRAVENOUS
  Filled 2024-04-02: qty 84

## 2024-04-02 MED ORDER — OXALIPLATIN CHEMO INJECTION 100 MG/20ML
85.0000 mg/m2 | Freq: Once | INTRAVENOUS | Status: AC
  Administered 2024-04-02: 150 mg via INTRAVENOUS
  Filled 2024-04-02: qty 10

## 2024-04-02 MED ORDER — LEUCOVORIN CALCIUM INJECTION 350 MG
400.0000 mg/m2 | Freq: Once | INTRAVENOUS | Status: DC
  Filled 2024-04-02: qty 35

## 2024-04-02 MED ORDER — DEXAMETHASONE SOD PHOSPHATE PF 10 MG/ML IJ SOLN
10.0000 mg | Freq: Once | INTRAMUSCULAR | Status: AC
  Administered 2024-04-02: 10 mg via INTRAVENOUS

## 2024-04-02 MED ORDER — DEXTROSE 5 % IV SOLN: INTRAVENOUS | Status: DC

## 2024-04-02 MED ORDER — PALONOSETRON HCL INJECTION 0.25 MG/5ML
0.2500 mg | Freq: Once | INTRAVENOUS | Status: AC
  Administered 2024-04-02: 0.25 mg via INTRAVENOUS
  Filled 2024-04-02: qty 5

## 2024-04-02 MED ORDER — LEUCOVORIN CALCIUM INJECTION 350 MG
400.0000 mg/m2 | Freq: Once | INTRAVENOUS | Status: AC
  Administered 2024-04-02: 700 mg via INTRAVENOUS
  Filled 2024-04-02: qty 35

## 2024-04-02 NOTE — Patient Instructions (Signed)
 [facility] - A DEPT OF [facility]  Discharge Instructions: Thank you for choosing [facility] to provide your oncology and hematology care.   If you have a lab appointment with the Cancer Center, please go directly to the Cancer Center and check in at the registration area.   Wear comfortable clothing and clothing appropriate for easy access to any Portacath or PICC line.   We strive to give you quality time with your provider. You may need to reschedule your appointment if you arrive late (15 or more minutes).  Arriving late affects you and other patients whose appointments are after yours.  Also, if you miss three or more appointments without notifying the office, you may be dismissed from the clinic at the provider's discretion.      For prescription refill requests, have your pharmacy contact our office and allow 72 hours for refills to be completed.    Today you received the following chemotherapy and/or immunotherapy agents: Oxaliplatin, Leucovorin, and Fluorouracil.      To help prevent nausea and vomiting after your treatment, we encourage you to take your nausea medication as directed.  BELOW ARE SYMPTOMS THAT SHOULD BE REPORTED IMMEDIATELY: *FEVER GREATER THAN 100.4 F (38 C) OR HIGHER *CHILLS OR SWEATING *NAUSEA AND VOMITING THAT IS NOT CONTROLLED WITH YOUR NAUSEA MEDICATION *UNUSUAL SHORTNESS OF BREATH *UNUSUAL BRUISING OR BLEEDING *URINARY PROBLEMS (pain or burning when urinating, or frequent urination) *BOWEL PROBLEMS (unusual diarrhea, constipation, pain near the anus) TENDERNESS IN MOUTH AND THROAT WITH OR WITHOUT PRESENCE OF ULCERS (sore throat, sores in mouth, or a toothache) UNUSUAL RASH, SWELLING OR PAIN  UNUSUAL VAGINAL DISCHARGE OR ITCHING   Items with * indicate a potential emergency and should be followed up as soon as possible or go to the Emergency Department if any problems should occur.  Please show the  CHEMOTHERAPY ALERT CARD or IMMUNOTHERAPY ALERT CARD at check-in to the Emergency Department and triage nurse.  Should you have questions after your visit or need to cancel or reschedule your appointment, please contact [facility] - A DEPT OF [facility]  Dept: [phone]  and follow the prompts.  Office hours are 8:00 a.m. to 4:30 p.m. Monday - Friday. Please note that voicemails left after 4:00 p.m. may not be returned until the following business day.  We are closed weekends and major holidays. You have access to a nurse at all times for urgent questions. Please call the main number to the clinic Dept: [phone] and follow the prompts.   For any non-urgent questions, you may also contact your provider using MyChart. We now offer e-Visits for anyone 35 and older to request care online for non-urgent symptoms. For details visit [email or web address].   Also download the MyChart app! Go to the app store, search "MyChart", open the app, select [place], and log in with your MyChart username and password.  The chemotherapy medication bag should finish at 46 hours, 96 hours, or 7 days. For example, if your pump is scheduled for 46 hours and it was put on at 4:00 p.m., it should finish at 2:00 p.m. the day it is scheduled to come off regardless of your appointment time.     Estimated time to finish at:   If the display on your pump reads "Low Volume" and it is beeping, take the batteries out of the pump and come to the cancer center for  it to be taken off.   If the pump alarms go off prior to the pump reading "Low Volume" then call [phone] and someone can assist you.  If the plunger comes out and the chemotherapy medication is leaking out, please use your home chemo spill kit to clean up the spill. Do NOT use paper towels or other household products.  If you have problems or questions regarding your pump, please call either [phone] (24 hours a  day) or the cancer center Monday-Friday 8:00 a.m.- 4:30 p.m. at the clinic number and we will assist you. If you are unable to get assistance, then go to the nearest Emergency Department and ask the staff to contact the IV team for assistance.

## 2024-04-02 NOTE — Progress Notes (Signed)
 [facility]   Telephone:([phone]) [phone] [phone]   Clinic Follow up Note   Patient Care Team: Nche, Roselie Rockford, NP as PCP - General (Internal Medicine) Waddell Danelle ORN, MD as PCP - Electrophysiology (Cardiology) Verlin Lonni BIRCH, MD as PCP - Cardiology (Cardiology) Prescilla Beams, MD as Consulting Physician (Nephrology) Waddell Danelle ORN, MD as Consulting Physician (Cardiology) Gloriann Chick, MD as Consulting Physician (Obstetrics and Gynecology) Camillo Golas, MD as Attending Physician (Ophthalmology) Lanny Callander, MD as Consulting Physician (Hematology and Oncology)  Date of Service:  04/02/2024  CHIEF COMPLAINT: f/u of gastric cancer  CURRENT THERAPY:  Adjuvant CapeOx, changed to FOLFOX today due to poor tolerance.  Oncology History   Gastric adenocarcinoma (HCC) pT1bN1M0 stage IB - Patient has chronic stomach issue after surgery for bowel obstruction in January 2021.  Routine screening EGD in July 2025 showed a 5 cm gastric polyps in distal stomach and a biopsy showed high-grade dysplasia and a small focus of adenocarcinoma in situ.  Staging CT scan was negative.  -Status post distal partial gastrostomy on January 11, 2024, which showed 1 cm invasive well to moderately differentiated adenocarcinoma, 1 out of 20 positive lymph node, margins are clear. -given the D2 node dissection, no adjuvant radiation needed, I recommend adjuvant chemotherapy CAPOX for 6 months  Assessment & Plan Gastric cancer on adjuvant chemotherapy Gastric cancer managed with adjuvant chemotherapy. Recent cycle was particularly challenging with severe body aches and prolonged recovery. Switched to a pump for oxaliplatin to reduce dose and improve tolerance. No need for radiation therapy as discussed previously. Signatera test for circulating tumor DNA was negative, indicating no detectable tumor DNA in the blood. - Administered chemotherapy via pump with reduced oxaliplatin  dose (85 mg/m2) - Monitor for neuropathy symptoms and use ice bags during infusion if needed - Continue Signatera test every three months  Chemotherapy-induced anemia Hemoglobin decreased from 12.2 to 10.7, likely due to chemotherapy. No current symptoms of fatigue, but risk of further decline exists. Blood transfusion considered if hemoglobin drops below 8. - Monitor hemoglobin levels - Will consider blood transfusion if hemoglobin drops below 8  Chemotherapy-induced nausea and appetite loss Nausea and appetite loss noted, particularly after recent chemotherapy cycle. Steroids prescribed to manage symptoms and maintain energy and appetite. Steroid use is optional and as needed, with a recommended dose of one tablet for 2-4 days post-chemotherapy. - Continue steroid use as needed, starting the day after chemotherapy for 2-4 days - Monitor for nausea and appetite changes  Hypokalemia, improving Potassium levels improved to 3.4. Previous low potassium levels may have contributed to fatigue but not to the severity of recent symptoms, which are attributed to chemotherapy. - Continue potassium supplementation - Consider potassium salt as a dietary supplement  Plan - She tolerated the second cycle CapeOx poorly, will change to FOLFOX today.  She has recovered well overall. - Lab reviewed, adequate for treatment - Follow-up in 2 weeks before next cycle chemo   SUMMARY OF ONCOLOGIC HISTORY: Oncology History  Gastric adenocarcinoma (HCC)  11/30/2023 Initial Diagnosis   Gastric adenocarcinoma (HCC)   01/11/2024 Cancer Staging   Staging form: Stomach, AJCC 8th Edition - Pathologic stage from 01/11/2024: Stage IB (pT1b, pN1, cM0) - Signed by Lanny Callander, MD on 01/25/2024 Histologic grade (G): G2 Histologic grading system: 3 grade system Residual tumor (R): R0   02/17/2024 - 03/08/2024 Chemotherapy   Patient is on Treatment Plan : GASTRIC CapeOx (1000/130) q21d x 8 Cycles  04/02/2024 -   Chemotherapy   Patient is on Treatment Plan : GASTRIC FOLFOX q14d x 12 cycles        Discussed the use of AI scribe software for clinical note transcription with the patient, who gave verbal consent to proceed.  History of Present Illness Susan Whitaker is a 53 year old female with gastric cancer who presents for follow-up while on adjuvant chemotherapy. She was not accompanied by her sister, who could not be present.  She is experiencing severe body aches, an intense taste on her tongue, and difficulty walking, requiring assistance after her last chemotherapy infusion. These symptoms began during the final 15 minutes of the session and lasted for a week.  Her potassium level has improved to 3.4 with adherence to medication and dietary recommendations. She weighs 153 pounds, an increase from her previous visit, and is making efforts to maintain adequate nutrition despite her symptoms. She can perform daily activities such as showering and dressing.  No numbness or tingling is present unless she touches metal, indicating cold sensitivity. Hemoglobin levels have decreased from 12.2 to 10.7.     All other systems were reviewed with the patient and are negative.  MEDICAL HISTORY:  Past Medical History:  Diagnosis Date   Abdominal bloating 09/14/2020   Abdominal pain 10/04/2020   Allergy     Cancer (HCC) 2025   Gastric Cancer   Class 2 obesity due to excess calories with body mass index (BMI) of 35.0 to 35.9 in adult 06/2010   DM type 2 (diabetes mellitus, type 2) (HCC)    Dysrhythmia    SVT s/p ablation   Gastric ulcer    GERD (gastroesophageal reflux disease)    H/O constipation    H/O hemorrhoids    History of small bowel obstruction 06/02/2019   Hyperlipidemia    Hypertension    Hypokalemia 2019   Menorrhagia    Microalbuminuria 02/15/2012   Primary hyperaldosteronism 02/21/2012   S/P radiofrequency ablation operation for arrhythmia 10/03/20 10/04/2020   SVT (supraventricular  tachycardia)    Noted 11/2011 admission   SVT (supraventricular tachycardia) 12/29/2011   Thrombocytopenia    Type 2 diabetes mellitus with obesity 11/17/2020   IMO SNOMED Dx Update Oct 2024     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    SURGICAL HISTORY: Past Surgical History:  Procedure Laterality Date   ABDOMINAL ADHESION SURGERY  06/03/2019   Dr Curvin   ABDOMINAL SURGERY     25 months of age-- unsure of type of surgery   COLONOSCOPY  04/2016   hemorrhoids--normal per pt with Dr Kristie   DILATATION & CURETTAGE/HYSTEROSCOPY WITH MYOSURE N/A 02/08/2020   Procedure: DILATATION & CURETTAGE/HYSTEROSCOPY WITH MYOSURE;  Surgeon: Gloriann Chick, MD;  Location: [facility];  Service: Gynecology;  Laterality: N/A;   ESOPHAGOGASTRODUODENOSCOPY N/A 12/26/2023   Procedure: EGD (ESOPHAGOGASTRODUODENOSCOPY);  Surgeon: Wilhelmenia Aloha Raddle., MD;  Location: [facility];  Service: Gastroenterology;  Laterality: N/A;   EUS N/A 12/26/2023   Procedure: ULTRASOUND, UPPER GI TRACT, ENDOSCOPIC;  Surgeon: Wilhelmenia Aloha Raddle., MD;  Location: [facility];  Service: Gastroenterology;  Laterality: N/A;   HEMORRHOID SURGERY  2005/2006   INTRAUTERINE DEVICE (IUD) INSERTION N/A 02/08/2020   Procedure: INTRAUTERINE DEVICE (IUD) INSERTION UNDER ULTRASOUND GUIDANCE;  Surgeon: Gloriann Chick, MD;  Location: [facility];  Service: Gynecology;  Laterality: N/A;  Mirena    LAPAROSCOPY N/A 01/11/2024   Procedure: LAPAROSCOPY, DIAGNOSTIC;  Surgeon: Aron Shoulders, MD;  Location: [facility];  Service: General;  Laterality: N/A;  LAPAROTOMY N/A 06/03/2019   Procedure: EXPLORATORY LAPAROTOMY WITH LYSIS OF ADHESIONS;  Surgeon: Curvin Deward MOULD, MD;  Location: [facility];  Service: General;  Laterality: N/A;   PARTIAL GASTRECTOMY N/A 01/11/2024   Procedure: GASTRECTOMY, PARTIAL;  Surgeon: Aron Shoulders, MD;  Location: [facility];  Service: General;  Laterality: N/A;  DISTAL GASTRECTOMY   PORTACATH PLACEMENT N/A 02/15/2024   Procedure:  INSERTION, TUNNELED CENTRAL VENOUS DEVICE, WITH PORT;  Surgeon: Aron Shoulders, MD;  Location: [facility];  Service: General;  Laterality: N/A;   SVT ABLATION N/A 10/03/2020   Procedure: SVT ABLATION;  Surgeon: Waddell Danelle ORN, MD;  Location: [facility];  Service: Cardiovascular;  Laterality: N/A;   UPPER GASTROINTESTINAL ENDOSCOPY     UTERINE FIBROID EMBOLIZATION  2010   at [facility]    I have reviewed the social history and family history with the patient and they are unchanged from previous note.  ALLERGIES:  is allergic to lisinopril.  MEDICATIONS:  Current Outpatient Medications  Medication Sig Dispense Refill   acetaminophen  (TYLENOL ) 325 MG tablet Take 2 tablets (650 mg total) by mouth every 4 (four) hours as needed for headache or mild pain.     acetaminophen  (TYLENOL ) 500 MG tablet Take 2 tablets (1,000 mg total) by mouth every 8 (eight) hours. 30 tablet 0   amLODipine  (NORVASC ) 5 MG tablet Take 1 tablet (5 mg total) by mouth daily. 90 tablet 1   atorvastatin  (LIPITOR) 80 MG tablet Take 1 tablet (80 mg total) by mouth daily. 90 tablet 3   blood glucose meter kit and supplies Use in the morning, afternoon, and at bedtime. 1 each 0   Blood Glucose Monitoring Suppl (BLOOD GLUCOSE MONITOR SYSTEM) w/Device KIT Use to check blood sugar 3 times daily. 1 kit 0   capecitabine  (XELODA ) 500 MG tablet TAKE 3 TABLETS BY MOUTH TWICE  DAILY WITHIN 1/2 HOUR OF A MEAL  FOR 14 DAYS ON THEN 7 DAYS OFF  AS DIRECTED 84 tablet 0   dexamethasone  (DECADRON ) 4 MG tablet Take 2 tablets (8 mg total) by mouth daily. Start the day after chemotherapy for 2 days. Take with food. 30 tablet 1   feeding supplement (ENSURE PLUS HIGH PROTEIN) LIQD Take 237 mLs by mouth 2 (two) times daily between meals.     gabapentin  (NEURONTIN ) 100 MG capsule Take 2 capsules (200 mg total) by mouth 3 (three) times daily. 180 capsule 0   glucose blood (ACCU-CHEK GUIDE) test strip Use as instructed to check once daily. 50 each 12    Glucose Blood (BLOOD GLUCOSE TEST STRIPS) STRP Use 1 each daily to test blood glucose levels. 100 each 3   Glucose Blood (BLOOD GLUCOSE TEST STRIPS) STRP Use 1 each at morning, noon, and bedtime to check blood glucose. 100 each 3   levonorgestrel  (MIRENA , 52 MG,) 20 MCG/DAY IUD Mirena  20 mcg/24 hours (7 yrs) 52 mg intrauterine device  Take by intrauterine route.     lidocaine -prilocaine  (EMLA ) cream Apply to affected area once. 30 g 3   methocarbamol  (ROBAXIN ) 500 MG tablet Take 1 tablet (500 mg total) by mouth every 6 (six) hours as needed for muscle spasms. 90 tablet 1   ondansetron  (ZOFRAN ) 8 MG tablet Take 1 tablet (8 mg total) by mouth every 8 (eight) hours as needed for nausea or vomiting. Start on the third day after chemotherapy. 30 tablet 1   ondansetron  (ZOFRAN -ODT) 4 MG disintegrating tablet Dissolve 1 tablet (4 mg total) by mouth every 6 (six)  hours as needed for nausea. 20 tablet 2   ONETOUCH DELICA LANCETS FINE MISC Use to check blood sugar 3 times daily 100 each 3   oxyCODONE  (OXY IR/ROXICODONE ) 5 MG immediate release tablet Take 1-2 tablets (5-10 mg total) by mouth every 4 (four) hours as needed for moderate pain (pain score 4-6). 40 tablet 0   pantoprazole  (PROTONIX ) 40 MG tablet Take 1 tablet (40 mg total) by mouth daily. 90 tablet 3   polyethylene glycol (MIRALAX  / GLYCOLAX ) 17 g packet Take 17 g by mouth daily.     potassium chloride  SA (KLOR-CON  M) 20 MEQ tablet Take 2 tablets (40 mEq total) by mouth 2 (two) times daily. Correction to previous prescription 120 tablet 1   Probiotic Product (ULTRAFLORA IMMUNE HEALTH PO) Take 1 capsule by mouth daily.     prochlorperazine  (COMPAZINE ) 10 MG tablet Take 1 tablet (10 mg total) by mouth every 6 (six) hours as needed for nausea or vomiting. 30 tablet 1   No current facility-administered medications for this visit.   Facility-Administered Medications Ordered in Other Visits  Medication Dose Route Frequency Provider Last Rate Last Admin    dexamethasone  (DECADRON ) injection 10 mg  10 mg Intravenous Once Lanny Callander, MD       dextrose  5 % solution   Intravenous Continuous Lanny Callander, MD       fluorouracil (ADRUCIL) 4,200 mg in sodium chloride  0.9 % 66 mL chemo infusion  2,400 mg/m2 (Treatment Plan Recorded) Intravenous 1 day or 1 dose Lanny Callander, MD       fluorouracil (ADRUCIL) chemo injection 700 mg  400 mg/m2 (Treatment Plan Recorded) Intravenous Once Lanny Callander, MD       leucovorin 700 mg in dextrose  5 % 250 mL infusion  400 mg/m2 (Treatment Plan Recorded) Intravenous Once Lanny Callander, MD       oxaliplatin (ELOXATIN) 150 mg in dextrose  5 % 500 mL chemo infusion  85 mg/m2 (Treatment Plan Recorded) Intravenous Once Lanny Callander, MD       palonosetron (ALOXI) injection 0.25 mg  0.25 mg Intravenous Once Lanny Callander, MD        PHYSICAL EXAMINATION: ECOG PERFORMANCE STATUS: 1 - Symptomatic but completely ambulatory  Vitals:   04/02/24 0847  BP: 120/75  Pulse: 98  Resp: 15  Temp: 97.8 F (36.6 C)  SpO2: 100%   Wt Readings from Last 3 Encounters:  04/02/24 153 lb 6.4 oz (69.6 kg)  03/30/24 151 lb 3.2 oz (68.6 kg)  03/19/24 152 lb 9.6 oz (69.2 kg)     GENERAL:alert, no distress and comfortable SKIN: skin color, texture, turgor are normal, no rashes or significant lesions EYES: normal, Conjunctiva are pink and non-injected, sclera clear NECK: supple, thyroid  normal size, non-tender, without nodularity LYMPH:  no palpable lymphadenopathy in the cervical, axillary  LUNGS: clear to auscultation and percussion with normal breathing effort HEART: regular rate & rhythm and no murmurs and no lower extremity edema ABDOMEN:abdomen soft, non-tender and normal bowel sounds Musculoskeletal:no cyanosis of digits and no clubbing  NEURO: alert & oriented x 3 with fluent speech, no focal motor/sensory deficits  Physical Exam MEASUREMENTS: Weight- 153.  LABORATORY DATA:  I have reviewed the data as listed    Latest Ref Rng & Units 03/30/2024    11:27 AM 03/19/2024    3:51 PM 03/08/2024    9:28 AM  CBC  WBC 4.0 - 10.5 K/uL 5.4  6.2  5.1   Hemoglobin 12.0 - 15.0 g/dL 89.2  12.2  12.0   Hematocrit 36.0 - 46.0 % 31.7  35.7  34.7   Platelets 150 - 400 K/uL 241  182  396         Latest Ref Rng & Units 03/30/2024   11:27 AM 03/19/2024    3:51 PM 03/08/2024    9:28 AM  CMP  Glucose 70 - 99 mg/dL 869  881  883   BUN 6 - 20 mg/dL 14  12  10    Creatinine 0.44 - 1.00 mg/dL 9.26  9.35  9.22   Sodium 135 - 145 mmol/L 142  140  144   Potassium 3.5 - 5.1 mmol/L 3.4  3.6  2.8   Chloride 98 - 111 mmol/L 107  104  104   CO2 22 - 32 mmol/L 30  23  32   Calcium  8.9 - 10.3 mg/dL 9.3  9.6  9.3   Total Protein 6.5 - 8.1 g/dL 7.1  7.7  7.7   Total Bilirubin 0.0 - 1.2 mg/dL 0.6  0.6  0.5   Alkaline Phos 38 - 126 U/L 60  79  67   AST 15 - 41 U/L 17  23  15    ALT 0 - 44 U/L 14  18  14        RADIOGRAPHIC STUDIES: I have personally reviewed the radiological images as listed and agreed with the findings in the report. No results found.    No orders of the defined types were placed in this encounter.  All questions were answered. The patient knows to call the clinic with any problems, questions or concerns. No barriers to learning was detected. The total time spent in the appointment was 30 minutes, including review of chart and various tests results, discussions about plan of care and coordination of care plan     Onita Mattock, MD 04/02/2024

## 2024-04-04 ENCOUNTER — Inpatient Hospital Stay

## 2024-04-06 ENCOUNTER — Telehealth: Payer: Self-pay

## 2024-04-06 ENCOUNTER — Encounter: Payer: Self-pay | Admitting: Hematology

## 2024-04-06 NOTE — Telephone Encounter (Signed)
 Pt's daughter [name] called to see if Dr Lanny wrote the letter for the pt's FMLA extension for the pt's job.  [name] said the pt discussed this with Dr Lanny earlier this week when the pt was in clinic.  [name] said that the pt's job needs the letter before the close of business on Monday.  [name] said the pt is coming in on Monday for tx and would like to pick the letter up then so they can fax the letter to the pt's job.  Stated this nurse will make Dr Lanny aware.

## 2024-04-09 ENCOUNTER — Inpatient Hospital Stay

## 2024-04-09 ENCOUNTER — Inpatient Hospital Stay: Admitting: Hematology

## 2024-04-09 NOTE — Progress Notes (Signed)
 Nutrition Follow-up:  Patient with stomach cancer, s/p distal partial gastrectomy in 12/2023.  Chemotherapy switched to Folfox.    Met with patient and 1 of daughters.  Reports no nausea.  However over the last 7 days after treatment eating has been challenging due to taste change.  Metaqil has been ordered but not arrived yet.  Has been able to eat soup from [employer] (potatoes, greens, sausage in creamy soup), peaches, chicken bowls with cheese and vegetables, lime and vanilla pudding.  Drinking water  and liquid from fruit cup.  Urine is lighter in color.  Bowels are moving with taking miralax  and stool softner as needed.  Had bowel movement last 3 days.  Has been drinking ensure plus 4 oz but not if eating food.  Has not tried boost VHC.  Taking potassium.      Medications: reviewed  Labs: K 3.4  Anthropometrics:   Weight 147 lb 2 oz in RD office today 153 lb 6.4 oz on 11/17 151 lb 2 oz on 11/14 152 lb 6 oz on 11/3 159 lb 9 oz on 10/23 167 lb on 9/30   NUTRITION DIAGNOSIS: Food and nutrition knowledge deficit, improved   INTERVENTION:  Continue small frequent meals. Limiting liquids at meal times Continue 4 oz of Ensure Plus at least BID.   Continue bowel regimen Try metaqil and baking soda, salt water  rinse    MONITORING, EVALUATION, GOAL: weight trends, intake   NEXT VISIT: Monday, Dec 15 during infusion  Lynix Bonine B. Dasie SOLON, CSO, LDN Registered Dietitian [phone]

## 2024-04-10 ENCOUNTER — Telehealth: Payer: Self-pay | Admitting: Hematology

## 2024-04-10 NOTE — Telephone Encounter (Signed)
 Called the pt and confirm appt and time with appts.

## 2024-04-11 ENCOUNTER — Telehealth: Payer: Self-pay

## 2024-04-11 ENCOUNTER — Inpatient Hospital Stay

## 2024-04-11 NOTE — Telephone Encounter (Signed)
 Spoke with pt regarding her disability form. Let her know that she would have to sign an ROI form. Pt verbalized understanding.

## 2024-04-15 NOTE — Assessment & Plan Note (Signed)
 pT1bN1M0 stage IB - Patient has chronic stomach issue after surgery for bowel obstruction in January 2021.  Routine screening EGD in July 2025 showed a 5 cm gastric polyps in distal stomach and a biopsy showed high-grade dysplasia and a small focus of adenocarcinoma in situ.  Staging CT scan was negative.  -Status post distal partial gastrostomy on January 11, 2024, which showed 1 cm invasive well to moderately differentiated adenocarcinoma, 1 out of 20 positive lymph node, margins are clear. -given the D2 node dissection, no adjuvant radiation needed, I recommend adjuvant chemotherapy CAPOX for 6 months - She tolerated the second cycle CapeOx poorly, chemo changed to FOLFOX from cycle 3.

## 2024-04-16 ENCOUNTER — Inpatient Hospital Stay

## 2024-04-16 ENCOUNTER — Inpatient Hospital Stay: Attending: Hematology | Admitting: Hematology

## 2024-04-16 ENCOUNTER — Other Ambulatory Visit: Payer: Self-pay

## 2024-04-16 ENCOUNTER — Telehealth: Payer: Self-pay

## 2024-04-16 ENCOUNTER — Other Ambulatory Visit: Payer: Self-pay | Admitting: Nurse Practitioner

## 2024-04-16 VITALS — BP 122/76 | HR 99 | Temp 98.2°F | Resp 16 | Ht 63.0 in | Wt 151.1 lb

## 2024-04-16 DIAGNOSIS — D701 Agranulocytosis secondary to cancer chemotherapy: Secondary | ICD-10-CM | POA: Insufficient documentation

## 2024-04-16 DIAGNOSIS — C169 Malignant neoplasm of stomach, unspecified: Secondary | ICD-10-CM

## 2024-04-16 DIAGNOSIS — E876 Hypokalemia: Secondary | ICD-10-CM | POA: Insufficient documentation

## 2024-04-16 DIAGNOSIS — T451X5A Adverse effect of antineoplastic and immunosuppressive drugs, initial encounter: Secondary | ICD-10-CM | POA: Diagnosis not present

## 2024-04-16 DIAGNOSIS — Z5111 Encounter for antineoplastic chemotherapy: Secondary | ICD-10-CM | POA: Insufficient documentation

## 2024-04-16 DIAGNOSIS — D6959 Other secondary thrombocytopenia: Secondary | ICD-10-CM | POA: Insufficient documentation

## 2024-04-16 DIAGNOSIS — Z5189 Encounter for other specified aftercare: Secondary | ICD-10-CM | POA: Insufficient documentation

## 2024-04-16 DIAGNOSIS — D6481 Anemia due to antineoplastic chemotherapy: Secondary | ICD-10-CM | POA: Insufficient documentation

## 2024-04-16 LAB — CBC WITH DIFFERENTIAL (CANCER CENTER ONLY)
Abs Immature Granulocytes: 0 K/uL (ref 0.00–0.07)
Basophils Absolute: 0 K/uL (ref 0.0–0.1)
Basophils Relative: 1 %
Eosinophils Absolute: 0 K/uL (ref 0.0–0.5)
Eosinophils Relative: 2 %
HCT: 30.4 % — ABNORMAL LOW (ref 36.0–46.0)
Hemoglobin: 10.5 g/dL — ABNORMAL LOW (ref 12.0–15.0)
Immature Granulocytes: 0 %
Lymphocytes Relative: 87 %
Lymphs Abs: 1.8 K/uL (ref 0.7–4.0)
MCH: 29.8 pg (ref 26.0–34.0)
MCHC: 34.5 g/dL (ref 30.0–36.0)
MCV: 86.4 fL (ref 80.0–100.0)
Monocytes Absolute: 0.1 K/uL (ref 0.1–1.0)
Monocytes Relative: 5 %
Neutro Abs: 0.1 K/uL — CL (ref 1.7–7.7)
Neutrophils Relative %: 5 %
Platelet Count: 165 K/uL (ref 150–400)
RBC: 3.52 MIL/uL — ABNORMAL LOW (ref 3.87–5.11)
RDW: 16.7 % — ABNORMAL HIGH (ref 11.5–15.5)
WBC Count: 2.1 K/uL — ABNORMAL LOW (ref 4.0–10.5)
nRBC: 0 % (ref 0.0–0.2)

## 2024-04-16 LAB — CMP (CANCER CENTER ONLY)
ALT: 20 U/L (ref 0–44)
AST: 27 U/L (ref 15–41)
Albumin: 4.3 g/dL (ref 3.5–5.0)
Alkaline Phosphatase: 73 U/L (ref 38–126)
Anion gap: 11 (ref 5–15)
BUN: 10 mg/dL (ref 6–20)
CO2: 28 mmol/L (ref 22–32)
Calcium: 9.6 mg/dL (ref 8.9–10.3)
Chloride: 105 mmol/L (ref 98–111)
Creatinine: 0.74 mg/dL (ref 0.44–1.00)
GFR, Estimated: >60 mL/min (ref >60)
Glucose, Bld: 116 mg/dL — ABNORMAL HIGH (ref 70–99)
Potassium: 3 mmol/L — ABNORMAL LOW (ref 3.5–5.1)
Sodium: 143 mmol/L (ref 135–145)
Total Bilirubin: 0.7 mg/dL (ref 0.0–1.2)
Total Protein: 7.5 g/dL (ref 6.5–8.1)

## 2024-04-16 MED ORDER — SODIUM CHLORIDE 0.9% FLUSH
10.0000 mL | Freq: Once | INTRAVENOUS | Status: AC
  Administered 2024-04-16: 10 mL via INTRAVENOUS

## 2024-04-16 NOTE — Progress Notes (Signed)
 [facility]   [phone] [phone]   Clinic Follow up Note   Patient Care Team: Nche, Roselie Rockford, NP as PCP - General (Internal Medicine) Waddell Danelle ORN, MD as PCP - Electrophysiology (Cardiology) Verlin Lonni BIRCH, MD as PCP - Cardiology (Cardiology) Prescilla Beams, MD as Consulting Physician (Nephrology) Waddell Danelle ORN, MD as Consulting Physician (Cardiology) Gloriann Chick, MD as Consulting Physician (Obstetrics and Gynecology) Camillo Golas, MD as Attending Physician (Ophthalmology) Lanny Callander, MD as Consulting Physician (Hematology and Oncology)  Date of Service:  04/16/2024  CHIEF COMPLAINT: f/u of gastric cancer  CURRENT THERAPY:  Adjuvant chemotherapy FOLFOX  Oncology History   Gastric adenocarcinoma [facility]) pT1bN1M0 stage IB - Patient has chronic stomach issue after surgery for bowel obstruction in January 2021.  Routine screening EGD in July 2025 showed a 5 cm gastric polyps in distal stomach and a biopsy showed high-grade dysplasia and a small focus of adenocarcinoma in situ.  Staging CT scan was negative.  -Status post distal partial gastrostomy on January 11, 2024, which showed 1 cm invasive well to moderately differentiated adenocarcinoma, 1 out of 20 positive lymph node, margins are clear. -given the D2 node dissection, no adjuvant radiation needed, I recommend adjuvant chemotherapy CAPOX for 6 months - She tolerated the second cycle CapeOx poorly, chemo changed to FOLFOX from cycle 3.  Assessment & Plan Gastric cancer undergoing chemotherapy Gastric cancer treatment is currently on hold due to chemotherapy-induced neutropenia. Neutrophil count is critically low at 0.1, necessitating postponement of chemotherapy to allow for recovery of blood counts. Hemoglobin is stable at 10.5, and platelet count is normal, so no blood transfusion is needed. The chemotherapy regimen is not uncommon to cause neutropenia, and this is not  indicative of any underlying infection or other issues. - Postponed chemotherapy for one week to allow recovery of neutrophil count. - Will administer growth factor injection (GCSF) at next chemotherapy session to stimulate white blood cell production. - Will submit insurance approval for growth factor injection. - Advised wearing a mask and avoiding sick people to reduce infection risk.  Chemotherapy-induced neutropenia Neutropenia secondary to chemotherapy with a neutrophil count of 0.1, which is severely low. This is a common side effect of chemotherapy, especially after several cycles, and is not indicative of an infection. The condition is expected to improve with a break from chemotherapy and the use of growth factor injections. - Postponed chemotherapy for one week to allow recovery of neutrophil count. - Will administer growth factor injection (GCSF) at next chemotherapy session to stimulate white blood cell production. - Advised taking Claritin for five days starting the day of the injection to alleviate bone pain. - Advised taking Tylenol  or ibuprofen  if needed for pain. - Advised wearing a mask and avoiding sick people to reduce infection risk.  Hypokalemia Potassium levels are currently being monitored. She is taking potassium supplements once daily. If potassium levels remain low, the dosage may be increased to twice daily. - Continue to monitor potassium levels. - If potassium levels remain low, will increase potassium supplementation to twice daily.  Plan - Due to significant neutropenia with ANC 0.1, will postpone her chemotherapy today to next week - Will add Neulasta on day 3 from next cycle if insurance approves - Follow-up next week before chemo    SUMMARY OF ONCOLOGIC HISTORY: Oncology History  Gastric adenocarcinoma (HCC)  11/30/2023 Initial Diagnosis   Gastric adenocarcinoma (HCC)   01/11/2024 Cancer Staging   Staging form: Stomach, AJCC 8th Edition -  Pathologic  stage from 01/11/2024: Stage IB (pT1b, pN1, cM0) - Signed by Lanny Callander, MD on 01/25/2024 Histologic grade (G): G2 Histologic grading system: 3 grade system Residual tumor (R): R0   02/17/2024 - 03/08/2024 Chemotherapy   Patient is on Treatment Plan : GASTRIC CapeOx (1000/130) q21d x 8 Cycles      04/02/2024 -  Chemotherapy   Patient is on Treatment Plan : GASTRIC FOLFOX q14d x 12 cycles        Discussed the use of AI scribe software for clinical note transcription with the patient, who gave verbal consent to proceed.  History of Present Illness Susan Whitaker is a 53 year old female with gastric cancer who presents for follow-up.  She is on chemotherapy for gastric cancer and prefers the pump over pills because it helps her stay on schedule and maintain better hydration. She has had no headaches. Her absolute neutrophil count is 0.1, so chemotherapy was held today. Her hemoglobin is 10.5 and platelets are normal. She takes potassium once daily. Her amlodipine  is ready for pickup.     All other systems were reviewed with the patient and are negative.  MEDICAL HISTORY:  Past Medical History:  Diagnosis Date   Abdominal bloating 09/14/2020   Abdominal pain 10/04/2020   Allergy     Cancer (HCC) 2025   Gastric Cancer   Class 2 obesity due to excess calories with body mass index (BMI) of 35.0 to 35.9 in adult 06/2010   DM type 2 (diabetes mellitus, type 2) (HCC)    Dysrhythmia    SVT s/p ablation   Gastric ulcer    GERD (gastroesophageal reflux disease)    H/O constipation    H/O hemorrhoids    History of small bowel obstruction 06/02/2019   Hyperlipidemia    Hypertension    Hypokalemia 2019   Menorrhagia    Microalbuminuria 02/15/2012   Primary hyperaldosteronism 02/21/2012   S/P radiofrequency ablation operation for arrhythmia 10/03/20 10/04/2020   SVT (supraventricular tachycardia)    Noted 11/2011 admission   SVT (supraventricular tachycardia) 12/29/2011   Thrombocytopenia     Type 2 diabetes mellitus with obesity 11/17/2020   IMO SNOMED Dx Update Oct 2024     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    SURGICAL HISTORY: Past Surgical History:  Procedure Laterality Date   ABDOMINAL ADHESION SURGERY  06/03/2019   Dr Curvin   ABDOMINAL SURGERY     59 months of age-- unsure of type of surgery   COLONOSCOPY  04/2016   hemorrhoids--normal per pt with Dr Kristie   DILATATION & CURETTAGE/HYSTEROSCOPY WITH MYOSURE N/A 02/08/2020   Procedure: DILATATION & CURETTAGE/HYSTEROSCOPY WITH MYOSURE;  Surgeon: Gloriann Chick, MD;  Location: [facility];  Service: Gynecology;  Laterality: N/A;   ESOPHAGOGASTRODUODENOSCOPY N/A 12/26/2023   Procedure: EGD (ESOPHAGOGASTRODUODENOSCOPY);  Surgeon: Wilhelmenia Aloha Raddle., MD;  Location: [facility];  Service: Gastroenterology;  Laterality: N/A;   EUS N/A 12/26/2023   Procedure: ULTRASOUND, UPPER GI TRACT, ENDOSCOPIC;  Surgeon: Wilhelmenia Aloha Raddle., MD;  Location: [facility];  Service: Gastroenterology;  Laterality: N/A;   HEMORRHOID SURGERY  2005/2006   INTRAUTERINE DEVICE (IUD) INSERTION N/A 02/08/2020   Procedure: INTRAUTERINE DEVICE (IUD) INSERTION UNDER ULTRASOUND GUIDANCE;  Surgeon: Gloriann Chick, MD;  Location: [facility];  Service: Gynecology;  Laterality: N/A;  Mirena    LAPAROSCOPY N/A 01/11/2024   Procedure: LAPAROSCOPY, DIAGNOSTIC;  Surgeon: Aron Shoulders, MD;  Location: [facility];  Service: General;  Laterality: N/A;   LAPAROTOMY N/A 06/03/2019  Procedure: EXPLORATORY LAPAROTOMY WITH LYSIS OF ADHESIONS;  Surgeon: Curvin Deward MOULD, MD;  Location: [facility];  Service: General;  Laterality: N/A;   PARTIAL GASTRECTOMY N/A 01/11/2024   Procedure: GASTRECTOMY, PARTIAL;  Surgeon: Aron Shoulders, MD;  Location: [facility];  Service: General;  Laterality: N/A;  DISTAL GASTRECTOMY   PORTACATH PLACEMENT N/A 02/15/2024   Procedure: INSERTION, TUNNELED CENTRAL VENOUS DEVICE, WITH PORT;  Surgeon: Aron Shoulders, MD;  Location: [facility];  Service:  General;  Laterality: N/A;   SVT ABLATION N/A 10/03/2020   Procedure: SVT ABLATION;  Surgeon: Waddell Danelle ORN, MD;  Location: [facility];  Service: Cardiovascular;  Laterality: N/A;   UPPER GASTROINTESTINAL ENDOSCOPY     UTERINE FIBROID EMBOLIZATION  2010   at [facility]    I have reviewed the social history and family history with the patient and they are unchanged from previous note.  ALLERGIES:  is allergic to lisinopril.  MEDICATIONS:  Current Outpatient Medications  Medication Sig Dispense Refill   acetaminophen  (TYLENOL ) 325 MG tablet Take 2 tablets (650 mg total) by mouth every 4 (four) hours as needed for headache or mild pain.     acetaminophen  (TYLENOL ) 500 MG tablet Take 2 tablets (1,000 mg total) by mouth every 8 (eight) hours. 30 tablet 0   amLODipine  (NORVASC ) 5 MG tablet Take 1 tablet (5 mg total) by mouth daily. 90 tablet 1   atorvastatin  (LIPITOR) 80 MG tablet Take 1 tablet (80 mg total) by mouth daily. 90 tablet 3   blood glucose meter kit and supplies Use in the morning, afternoon, and at bedtime. 1 each 0   Blood Glucose Monitoring Suppl (BLOOD GLUCOSE MONITOR SYSTEM) w/Device KIT Use to check blood sugar 3 times daily. 1 kit 0   dexamethasone  (DECADRON ) 4 MG tablet Take 2 tablets (8 mg total) by mouth daily. Start the day after chemotherapy for 2 days. Take with food. 30 tablet 1   feeding supplement (ENSURE PLUS HIGH PROTEIN) LIQD Take 237 mLs by mouth 2 (two) times daily between meals.     gabapentin  (NEURONTIN ) 100 MG capsule Take 2 capsules (200 mg total) by mouth 3 (three) times daily. 180 capsule 0   glucose blood (ACCU-CHEK GUIDE) test strip Use as instructed to check once daily. 50 each 12   Glucose Blood (BLOOD GLUCOSE TEST STRIPS) STRP Use 1 each daily to test blood glucose levels. 100 each 3   Glucose Blood (BLOOD GLUCOSE TEST STRIPS) STRP Use 1 each at morning, noon, and bedtime to check blood glucose. 100 each 3   levonorgestrel  (MIRENA , 52 MG,) 20  MCG/DAY IUD Mirena  20 mcg/24 hours (7 yrs) 52 mg intrauterine device  Take by intrauterine route.     lidocaine -prilocaine  (EMLA ) cream Apply to affected area once. 30 g 3   methocarbamol  (ROBAXIN ) 500 MG tablet Take 1 tablet (500 mg total) by mouth every 6 (six) hours as needed for muscle spasms. 90 tablet 1   ondansetron  (ZOFRAN ) 8 MG tablet Take 1 tablet (8 mg total) by mouth every 8 (eight) hours as needed for nausea or vomiting. Start on the third day after chemotherapy. 30 tablet 1   ondansetron  (ZOFRAN -ODT) 4 MG disintegrating tablet Dissolve 1 tablet (4 mg total) by mouth every 6 (six) hours as needed for nausea. 20 tablet 2   ONETOUCH DELICA LANCETS FINE MISC Use to check blood sugar 3 times daily 100 each 3   oxyCODONE  (OXY IR/ROXICODONE ) 5 MG immediate release tablet Take 1-2 tablets (5-10 mg  total) by mouth every 4 (four) hours as needed for moderate pain (pain score 4-6). 40 tablet 0   pantoprazole  (PROTONIX ) 40 MG tablet Take 1 tablet (40 mg total) by mouth daily. 90 tablet 3   polyethylene glycol (MIRALAX  / GLYCOLAX ) 17 g packet Take 17 g by mouth daily.     potassium chloride  SA (KLOR-CON  M) 20 MEQ tablet Take 2 tablets (40 mEq total) by mouth 2 (two) times daily. Correction to previous prescription 120 tablet 1   Probiotic Product (ULTRAFLORA IMMUNE HEALTH PO) Take 1 capsule by mouth daily.     prochlorperazine  (COMPAZINE ) 10 MG tablet Take 1 tablet (10 mg total) by mouth every 6 (six) hours as needed for nausea or vomiting. 30 tablet 1   No current facility-administered medications for this visit.    PHYSICAL EXAMINATION: ECOG PERFORMANCE STATUS: 1 - Symptomatic but completely ambulatory  Vitals:   04/16/24 1046  BP: 122/76  Pulse: 99  Resp: 16  Temp: 98.2 F (36.8 C)  SpO2: 100%   Wt Readings from Last 3 Encounters:  04/16/24 151 lb 1.6 oz (68.5 kg)  04/09/24 147 lb 2 oz (66.7 kg)  04/02/24 153 lb 6.4 oz (69.6 kg)     GENERAL:alert, no distress and  comfortable SKIN: skin color, texture, turgor are normal, no rashes or significant lesions EYES: normal, Conjunctiva are pink and non-injected, sclera clear NECK: supple, thyroid  normal size, non-tender, without nodularity LYMPH:  no palpable lymphadenopathy in the cervical, axillary  LUNGS: clear to auscultation and percussion with normal breathing effort HEART: regular rate & rhythm and no murmurs and no lower extremity edema ABDOMEN:abdomen soft, non-tender and normal bowel sounds Musculoskeletal:no cyanosis of digits and no clubbing  NEURO: alert & oriented x 3 with fluent speech, no focal motor/sensory deficits  Physical Exam    LABORATORY DATA:  I have reviewed the data as listed    Latest Ref Rng & Units 04/16/2024   10:24 AM 03/30/2024   11:27 AM 03/19/2024    3:51 PM  CBC  WBC 4.0 - 10.5 K/uL 2.1  5.4  6.2   Hemoglobin 12.0 - 15.0 g/dL 89.4  89.2  87.7   Hematocrit 36.0 - 46.0 % 30.4  31.7  35.7   Platelets 150 - 400 K/uL 165  241  182         Latest Ref Rng & Units 04/16/2024   10:24 AM 03/30/2024   11:27 AM 03/19/2024    3:51 PM  CMP  Glucose 70 - 99 mg/dL 883  869  881   BUN 6 - 20 mg/dL 10  14  12    Creatinine 0.44 - 1.00 mg/dL 9.25  9.26  9.35   Sodium 135 - 145 mmol/L 143  142  140   Potassium 3.5 - 5.1 mmol/L 3.0  3.4  3.6   Chloride 98 - 111 mmol/L 105  107  104   CO2 22 - 32 mmol/L 28  30  23    Calcium  8.9 - 10.3 mg/dL 9.6  9.3  9.6   Total Protein 6.5 - 8.1 g/dL 7.5  7.1  7.7   Total Bilirubin 0.0 - 1.2 mg/dL 0.7  0.6  0.6   Alkaline Phos 38 - 126 U/L 73  60  79   AST 15 - 41 U/L 27  17  23    ALT 0 - 44 U/L 20  14  18        RADIOGRAPHIC STUDIES: I have personally reviewed the radiological images  as listed and agreed with the findings in the report. No results found.    Orders Placed This Encounter  Procedures   CBC with Differential (Cancer Center Only)    Standing Status:   Future    Expected Date:   06/04/2024    Expiration Date:    06/04/2025   CMP (Cancer Center only)    Standing Status:   Future    Expected Date:   06/04/2024    Expiration Date:   06/04/2025   All questions were answered. The patient knows to call the clinic with any problems, questions or concerns. No barriers to learning was detected. The total time spent in the appointment was 25 minutes, including review of chart and various tests results, discussions about plan of care and coordination of care plan     Onita Mattock, MD 04/16/2024

## 2024-04-16 NOTE — Telephone Encounter (Signed)
 CRITICAL VALUE STICKER  CRITICAL VALUE: ANC 0.1  RECEIVER (on-site recipient of call):Aadhya Bustamante, LPN  DATE & TIME NOTIFIED: 04/16/2024  10:51  MESSENGER (representative from lab):Jessica  MD NOTIFIED: Onita Mattock, MD  TIME OF NOTIFICATION:10:51  RESPONSE:

## 2024-04-17 ENCOUNTER — Telehealth: Payer: Self-pay

## 2024-04-17 NOTE — Telephone Encounter (Signed)
 Notified the pt regarding her FMLA/Disability being completed,faxed, and confrimation received. Pt verbalized understanding. Pt copy was also emailed upon request. No questions or concerns to be noted at this time.

## 2024-04-18 ENCOUNTER — Inpatient Hospital Stay

## 2024-04-19 ENCOUNTER — Encounter: Payer: Self-pay | Admitting: Hematology

## 2024-04-19 ENCOUNTER — Other Ambulatory Visit (HOSPITAL_BASED_OUTPATIENT_CLINIC_OR_DEPARTMENT_OTHER): Payer: Self-pay

## 2024-04-20 ENCOUNTER — Other Ambulatory Visit: Payer: Self-pay

## 2024-04-20 ENCOUNTER — Other Ambulatory Visit (HOSPITAL_BASED_OUTPATIENT_CLINIC_OR_DEPARTMENT_OTHER): Payer: Self-pay

## 2024-04-20 MED ORDER — NYSTATIN 100000 UNIT/ML MT SUSP
Freq: Four times a day (QID) | OROMUCOSAL | 1 refills | Status: DC | PRN
  Filled 2024-04-20: qty 140, 14d supply, fill #0

## 2024-04-23 NOTE — Assessment & Plan Note (Signed)
 pT1bN1M0 stage IB - Patient has chronic stomach issue after surgery for bowel obstruction in January 2021.  Routine screening EGD in July 2025 showed a 5 cm gastric polyps in distal stomach and a biopsy showed high-grade dysplasia and a small focus of adenocarcinoma in situ.  Staging CT scan was negative.  -Status post distal partial gastrostomy on January 11, 2024, which showed 1 cm invasive well to moderately differentiated adenocarcinoma, 1 out of 20 positive lymph node, margins are clear. -given the D2 node dissection, no adjuvant radiation needed, I recommend adjuvant chemotherapy CAPOX for 6 months - She tolerated the second cycle CapeOx poorly, chemo changed to FOLFOX from cycle 3.

## 2024-04-23 NOTE — Progress Notes (Unsigned)
 Patient Care Team: Nche, Roselie Rockford, NP as PCP - General (Internal Medicine) Waddell Danelle ORN, MD as PCP - Electrophysiology (Cardiology) Verlin Lonni BIRCH, MD as PCP - Cardiology (Cardiology) Prescilla Beams, MD as Consulting Physician (Nephrology) Waddell Danelle ORN, MD as Consulting Physician (Cardiology) Gloriann Chick, MD as Consulting Physician (Obstetrics and Gynecology) Camillo Golas, MD as Attending Physician (Ophthalmology) Lanny Callander, MD as Consulting Physician (Hematology and Oncology)  Clinic Day:  04/24/2024  Referring physician: Lanny Callander, MD  ASSESSMENT & PLAN:   Assessment & Plan: Gastric adenocarcinoma Susan Whitaker) pT1bN1M0 stage IB - Patient has chronic stomach issue after surgery for bowel obstruction in January 2021.  Routine screening EGD in July 2025 showed a 5 cm gastric polyps in distal stomach and a biopsy showed high-grade dysplasia and a small focus of adenocarcinoma in situ.  Staging CT scan was negative.  -Status post distal partial gastrostomy on January 11, 2024, which showed 1 cm invasive well to moderately differentiated adenocarcinoma, 1 out of 20 positive lymph node, margins are clear. -given the D2 node dissection, no adjuvant radiation needed, I recommend adjuvant chemotherapy CAPOX for 6 months - She tolerated the second cycle CapeOx poorly, chemo changed to FOLFOX from cycle 3. -Cycle 2 FOLFOX held due to neutropenia. She recovered well with one week break. Will proceed with cycle 2 chemotherapy FOLFOX with Neulasta injection to be given on day 3 (pump d/c).     Neutropenia WBC and ANC have recovered since last week. Today, WBC is 8.4 and anc is 3.3. She may proceed to chemotherapy FOLFOX, Cycle 2 day 1. She will receive G-CSF injection, Neulasta on day of pump d/c to prevent further episodes of neutropenia in the future.   Hypokalemia Patient is now taking 40 mEq potassium twice daily. Her potassium level is 4.0 today. She will continue the current  dosing. Will adjust/lower dosing as indicated.   Plan Labs reviewed. -Improved WBC and ANC. Stable anemia.  -CMP unremarkable with potassium 4.0.  Continue potassium of 40 mEq twice daily. Check levels with every visit.  Labs and patient presentation appropriate for treatment today. Proceed with Cycle 2 day 1 chemotherapy FOLFOX.  G-CSF injection, Neulasta, to be given on day of pump d/c to prevent neutropenia.  Plan for labs/flush, follow up, and treatment as scheduled.   The patient understands the plans discussed today and is in agreement with them.  She knows to contact our office if she develops concerns prior to her next appointment.  I provided 25 minutes of face-to-face time during this encounter and > 50% was spent counseling as documented under my assessment and plan.    Powell FORBES Lessen, NP  [facility] - A DEPT OF [facility] [address] [place] [address] Dept: [phone] Dept Fax: [phone]   No orders of the defined types were placed in this encounter.     CHIEF COMPLAINT:  CC: Gastric adenocarcinoma  Current Treatment: Adjuvant chemotherapy FOLFOX  INTERVAL HISTORY:  Basil is here today for repeat clinical assessment. She last saw Dr. Lanny on 04/16/2024.  Today, she presents for cycle 2 day 1 chemotherapy FOLFOX.  Treatment held last week due to neutropenia. WBC and anc have improved today and she will proceed to chemotherapy treatment today. She will get G-CSF injection on day of pump d/c. She states that she is feeling well today. She denies chest pain, chest pressure, or shortness of breath. She denies headaches  or visual disturbances. She denies abdominal pain, nausea, vomiting, or changes in bowel or bladder habits.  She states that she did have some constipation the first few days after treatment. Taking miralax  was beneficial. She denies fevers or chills. She denies pain. Her appetite is  fair. Her weight has been stable.  I have reviewed the past medical history, past surgical history, social history and family history with the patient and they are unchanged from previous note.  ALLERGIES:  is allergic to lisinopril.  MEDICATIONS:  Current Outpatient Medications  Medication Sig Dispense Refill   acetaminophen  (TYLENOL ) 325 MG tablet Take 2 tablets (650 mg total) by mouth every 4 (four) hours as needed for headache or mild pain.     acetaminophen  (TYLENOL ) 500 MG tablet Take 2 tablets (1,000 mg total) by mouth every 8 (eight) hours. 30 tablet 0   amLODipine  (NORVASC ) 5 MG tablet Take 1 tablet (5 mg total) by mouth daily. 90 tablet 1   atorvastatin  (LIPITOR) 80 MG tablet Take 1 tablet (80 mg total) by mouth daily. 90 tablet 3   blood glucose meter kit and supplies Use in the morning, afternoon, and at bedtime. 1 each 0   Blood Glucose Monitoring Suppl (BLOOD GLUCOSE MONITOR SYSTEM) w/Device KIT Use to check blood sugar 3 times daily. 1 kit 0   dexamethasone  (DECADRON ) 4 MG tablet Take 2 tablets (8 mg total) by mouth daily. Start the day after chemotherapy for 2 days. Take with food. 30 tablet 1   diphenhydrAMINE -nystatin -alum & mag hydroxide-simeth-lidocaine  Take by mouth 4 (four) times daily as needed for mouth pain. 140 mL 1   feeding supplement (ENSURE PLUS HIGH PROTEIN) LIQD Take 237 mLs by mouth 2 (two) times daily between meals.     gabapentin  (NEURONTIN ) 100 MG capsule Take 2 capsules (200 mg total) by mouth 3 (three) times daily. 180 capsule 0   glucose blood (ACCU-CHEK GUIDE) test strip Use as instructed to check once daily. 50 each 12   Glucose Blood (BLOOD GLUCOSE TEST STRIPS) STRP Use 1 each daily to test blood glucose levels. 100 each 3   Glucose Blood (BLOOD GLUCOSE TEST STRIPS) STRP Use 1 each at morning, noon, and bedtime to check blood glucose. 100 each 3   levonorgestrel  (MIRENA , 52 MG,) 20 MCG/DAY IUD Mirena  20 mcg/24 hours (7 yrs) 52 mg intrauterine device   Take by intrauterine route.     lidocaine -prilocaine  (EMLA ) cream Apply to affected area once. 30 g 3   methocarbamol  (ROBAXIN ) 500 MG tablet Take 1 tablet (500 mg total) by mouth every 6 (six) hours as needed for muscle spasms. 90 tablet 1   ondansetron  (ZOFRAN ) 8 MG tablet Take 1 tablet (8 mg total) by mouth every 8 (eight) hours as needed for nausea or vomiting. Start on the third day after chemotherapy. 30 tablet 1   ondansetron  (ZOFRAN -ODT) 4 MG disintegrating tablet Dissolve 1 tablet (4 mg total) by mouth every 6 (six) hours as needed for nausea. 20 tablet 2   ONETOUCH DELICA LANCETS FINE MISC Use to check blood sugar 3 times daily 100 each 3   oxyCODONE  (OXY IR/ROXICODONE ) 5 MG immediate release tablet Take 1-2 tablets (5-10 mg total) by mouth every 4 (four) hours as needed for moderate pain (pain score 4-6). 40 tablet 0   pantoprazole  (PROTONIX ) 40 MG tablet Take 1 tablet (40 mg total) by mouth daily. 90 tablet 3   polyethylene glycol (MIRALAX  / GLYCOLAX ) 17 g packet Take 17 g by mouth daily.  potassium chloride  SA (KLOR-CON  M) 20 MEQ tablet Take 2 tablets (40 mEq total) by mouth 2 (two) times daily. Correction to previous prescription 120 tablet 1   Probiotic Product (ULTRAFLORA IMMUNE HEALTH PO) Take 1 capsule by mouth daily.     prochlorperazine  (COMPAZINE ) 10 MG tablet Take 1 tablet (10 mg total) by mouth every 6 (six) hours as needed for nausea or vomiting. 30 tablet 1   No current facility-administered medications for this visit.   Facility-Administered Medications Ordered in Other Visits  Medication Dose Route Frequency Provider Last Rate Last Admin   dextrose  5 % solution   Intravenous Continuous Lanny Callander, MD 10 mL/hr at 04/24/24 1305 New Bag at 04/24/24 1305   fluorouracil  (ADRUCIL ) 4,200 mg in sodium chloride  0.9 % 66 mL chemo infusion  2,400 mg/m2 (Treatment Plan Recorded) Intravenous 1 day or 1 dose Lanny Callander, MD       fluorouracil  (ADRUCIL ) chemo injection 700 mg  400  mg/m2 (Treatment Plan Recorded) Intravenous Once Lanny Callander, MD       leucovorin  700 mg in dextrose  5 % 250 mL infusion  400 mg/m2 (Treatment Plan Recorded) Intravenous Once Lanny Callander, MD 143 mL/hr at 04/24/24 1406 700 mg at 04/24/24 1406   oxaliplatin  (ELOXATIN ) 150 mg in dextrose  5 % 500 mL chemo infusion  85 mg/m2 (Treatment Plan Recorded) Intravenous Once Lanny Callander, MD 265 mL/hr at 04/24/24 1409 150 mg at 04/24/24 1409    HISTORY OF PRESENT ILLNESS:   Oncology History  Gastric adenocarcinoma (HCC)  11/30/2023 Initial Diagnosis   Gastric adenocarcinoma (HCC)   01/11/2024 Cancer Staging   Staging form: Stomach, AJCC 8th Edition - Pathologic stage from 01/11/2024: Stage IB (pT1b, pN1, cM0) - Signed by Lanny Callander, MD on 01/25/2024 Histologic grade (G): G2 Histologic grading system: 3 grade system Residual tumor (R): R0   02/17/2024 - 03/08/2024 Chemotherapy   Patient is on Treatment Plan : GASTRIC CapeOx (1000/130) q21d x 8 Cycles      04/02/2024 -  Chemotherapy   Patient is on Treatment Plan : GASTRIC FOLFOX q14d x 12 cycles         REVIEW OF SYSTEMS:   Constitutional: Denies fevers, chills or abnormal weight loss Eyes: Denies blurriness of vision Ears, nose, mouth, throat, and face: Denies mucositis or sore throat, has noted improved mouth sores and pain since she started using Magic Mouthwash.  Respiratory: Denies cough, dyspnea or wheezes Cardiovascular: Denies palpitation, chest discomfort or lower extremity swelling Gastrointestinal:  Denies nausea, heartburn or change in bowel habits Skin: Denies abnormal skin rashes Lymphatics: Denies new lymphadenopathy or easy bruising Neurological:Denies numbness, tingling or new weaknesses Behavioral/Psych: Mood is stable, no new changes  All other systems were reviewed with the patient and are negative.   VITALS:   Today's Vitals   04/24/24 1127 04/24/24 1154  BP: 110/70   Pulse: (!) 112   Resp: 17   Temp: 97.9 F (36.6 C)    SpO2: 99%   Weight: 149 lb 1.6 oz (67.6 kg)   PainSc:  0-No pain   Body mass index is 26.41 kg/m.   Wt Readings from Last 3 Encounters:  04/24/24 149 lb 1.6 oz (67.6 kg)  04/16/24 151 lb 1.6 oz (68.5 kg)  04/09/24 147 lb 2 oz (66.7 kg)    Body mass index is 26.41 kg/m.  Performance status (ECOG): 1 - Symptomatic but completely ambulatory  PHYSICAL EXAM:   GENERAL:alert, no distress and comfortable SKIN: skin color, texture, turgor are  normal, no rashes or significant lesions EYES: normal, Conjunctiva are pink and non-injected, sclera clear OROPHARYNX:no exudate, no erythema and lips, buccal mucosa, and tongue normal  NECK: supple, thyroid  normal size, non-tender, without nodularity LYMPH:  no palpable lymphadenopathy in the cervical, axillary or inguinal LUNGS: clear to auscultation and percussion with normal breathing effort HEART: regular rate & rhythm and no murmurs and no lower extremity edema ABDOMEN:abdomen soft, non-tender and normal bowel sounds Musculoskeletal:no cyanosis of digits and no clubbing  NEURO: alert & oriented x 3 with fluent speech, no focal motor/sensory deficits  LABORATORY DATA:  I have reviewed the data as listed    Component Value Date/Time   NA 144 04/24/2024 1105   NA 138 08/21/2012 1002   K 4.0 04/24/2024 1105   K 3.5 08/21/2012 1002   CL 106 04/24/2024 1105   CL 102 08/21/2012 1002   CO2 27 04/24/2024 1105   CO2 28 08/21/2012 1002   GLUCOSE 129 (H) 04/24/2024 1105   GLUCOSE 132 (H) 08/21/2012 1002   BUN 5 (L) 04/24/2024 1105   BUN 16.7 08/21/2012 1002   CREATININE 0.72 04/24/2024 1105   CREATININE 1.03 05/06/2023 0822   CREATININE 1.0 08/21/2012 1002   CALCIUM  10.1 04/24/2024 1105   CALCIUM  8.9 08/21/2012 1002   PROT 8.0 04/24/2024 1105   PROT 7.6 08/21/2012 1002   ALBUMIN  4.3 04/24/2024 1105   ALBUMIN  3.3 (L) 08/21/2012 1002   AST 27 04/24/2024 1105   AST 10 08/21/2012 1002   ALT 13 04/24/2024 1105   ALT 8 08/21/2012 1002    ALKPHOS 68 04/24/2024 1105   ALKPHOS 60 08/21/2012 1002   BILITOT 0.6 04/24/2024 1105   BILITOT 0.22 08/21/2012 1002   GFRNONAA >60 04/24/2024 1105   GFRNONAA 86 12/03/2013 1112   GFRAA >60 02/05/2020 1017   GFRAA >89 12/03/2013 1112    Lab Results  Component Value Date   WBC 8.4 04/24/2024   NEUTROABS 3.3 04/24/2024   HGB 11.2 (L) 04/24/2024   HCT 33.4 (L) 04/24/2024   MCV 89.3 04/24/2024   PLT 388 04/24/2024

## 2024-04-24 ENCOUNTER — Inpatient Hospital Stay

## 2024-04-24 ENCOUNTER — Inpatient Hospital Stay: Admitting: Nutrition

## 2024-04-24 ENCOUNTER — Encounter: Payer: Self-pay | Admitting: Nurse Practitioner

## 2024-04-24 ENCOUNTER — Inpatient Hospital Stay: Admitting: Nurse Practitioner

## 2024-04-24 VITALS — HR 83

## 2024-04-24 VITALS — BP 110/70 | HR 112 | Temp 97.9°F | Resp 17 | Wt 149.1 lb

## 2024-04-24 DIAGNOSIS — C169 Malignant neoplasm of stomach, unspecified: Secondary | ICD-10-CM

## 2024-04-24 DIAGNOSIS — D75839 Thrombocytosis, unspecified: Secondary | ICD-10-CM

## 2024-04-24 DIAGNOSIS — Z5111 Encounter for antineoplastic chemotherapy: Secondary | ICD-10-CM | POA: Diagnosis not present

## 2024-04-24 LAB — CMP (CANCER CENTER ONLY)
ALT: 13 U/L (ref 0–44)
AST: 27 U/L (ref 15–41)
Albumin: 4.3 g/dL (ref 3.5–5.0)
Alkaline Phosphatase: 68 U/L (ref 38–126)
Anion gap: 11 (ref 5–15)
BUN: 5 mg/dL — ABNORMAL LOW (ref 6–20)
CO2: 27 mmol/L (ref 22–32)
Calcium: 10.1 mg/dL (ref 8.9–10.3)
Chloride: 106 mmol/L (ref 98–111)
Creatinine: 0.72 mg/dL (ref 0.44–1.00)
GFR, Estimated: >60 mL/min (ref >60)
Glucose, Bld: 129 mg/dL — ABNORMAL HIGH (ref 70–99)
Potassium: 4 mmol/L (ref 3.5–5.1)
Sodium: 144 mmol/L (ref 135–145)
Total Bilirubin: 0.6 mg/dL (ref 0.0–1.2)
Total Protein: 8 g/dL (ref 6.5–8.1)

## 2024-04-24 LAB — CBC WITH DIFFERENTIAL (CANCER CENTER ONLY)
Abs Immature Granulocytes: 0.12 K/uL — ABNORMAL HIGH (ref 0.00–0.07)
Basophils Absolute: 0 K/uL (ref 0.0–0.1)
Basophils Relative: 1 %
Eosinophils Absolute: 0.1 K/uL (ref 0.0–0.5)
Eosinophils Relative: 1 %
HCT: 33.4 % — ABNORMAL LOW (ref 36.0–46.0)
Hemoglobin: 11.2 g/dL — ABNORMAL LOW (ref 12.0–15.0)
Immature Granulocytes: 1 %
Lymphocytes Relative: 46 %
Lymphs Abs: 3.9 K/uL (ref 0.7–4.0)
MCH: 29.9 pg (ref 26.0–34.0)
MCHC: 33.5 g/dL (ref 30.0–36.0)
MCV: 89.3 fL (ref 80.0–100.0)
Monocytes Absolute: 1 K/uL (ref 0.1–1.0)
Monocytes Relative: 12 %
Neutro Abs: 3.3 K/uL (ref 1.7–7.7)
Neutrophils Relative %: 39 %
Platelet Count: 388 K/uL (ref 150–400)
RBC: 3.74 MIL/uL — ABNORMAL LOW (ref 3.87–5.11)
RDW: 18.8 % — ABNORMAL HIGH (ref 11.5–15.5)
WBC Count: 8.4 K/uL (ref 4.0–10.5)
nRBC: 0 % (ref 0.0–0.2)

## 2024-04-24 MED ORDER — LEUCOVORIN CALCIUM INJECTION 350 MG
400.0000 mg/m2 | Freq: Once | INTRAVENOUS | Status: AC
  Administered 2024-04-24: 700 mg via INTRAVENOUS
  Filled 2024-04-24: qty 35

## 2024-04-24 MED ORDER — FLUOROURACIL CHEMO INJECTION 2.5 GM/50ML
400.0000 mg/m2 | Freq: Once | INTRAVENOUS | Status: AC
  Administered 2024-04-24: 700 mg via INTRAVENOUS
  Filled 2024-04-24: qty 14

## 2024-04-24 MED ORDER — OXALIPLATIN CHEMO INJECTION 100 MG/20ML
85.0000 mg/m2 | Freq: Once | INTRAVENOUS | Status: AC
  Administered 2024-04-24: 150 mg via INTRAVENOUS
  Filled 2024-04-24: qty 26.64

## 2024-04-24 MED ORDER — PALONOSETRON HCL INJECTION 0.25 MG/5ML
0.2500 mg | Freq: Once | INTRAVENOUS | Status: AC
  Administered 2024-04-24: 0.25 mg via INTRAVENOUS
  Filled 2024-04-24: qty 5

## 2024-04-24 MED ORDER — DEXAMETHASONE SOD PHOSPHATE PF 10 MG/ML IJ SOLN
10.0000 mg | Freq: Once | INTRAMUSCULAR | Status: AC
  Administered 2024-04-24: 10 mg via INTRAVENOUS

## 2024-04-24 MED ORDER — DEXTROSE 5 % IV SOLN: INTRAVENOUS | Status: DC

## 2024-04-24 MED ORDER — SODIUM CHLORIDE 0.9 % IV SOLN
2400.0000 mg/m2 | INTRAVENOUS | Status: DC
  Administered 2024-04-24: 4200 mg via INTRAVENOUS
  Filled 2024-04-24: qty 84

## 2024-04-24 NOTE — Progress Notes (Signed)
 Nutrition follow-up completed with patient and daughter during infusion for stomach cancer status post distal partial gastrectomy in August 2025.  Patient is receiving FOLFOX.  She is followed by Dr. Lanny.  Weight: 149 pounds 1.6 ounces December 9 151 pounds 1.6 ounces December 1 147 pounds 2 ounces November 24  Labs include glucose 129 and BUN 5  Medications reviewed.  Patient is in good spirits.  She is feeling better.  Her appetite is improved and she is eating more.  Mouth sores resolved with Magic mouthwash.  She has left her partial dentures out for now to encourage continued healing.  Denies nausea, vomiting, and constipation.  Continues to drink 4 ounces of Ensure Plus twice a day.  She has increased energy and has been getting out of bed and moving around the house more often.  Nutrition diagnosis: Food and nutrition related knowledge deficit, improved.  Intervention: Continue small frequent meals and snacks and limit liquids at meals to approximately 4 ounces.   Continue 4 ounces of Ensure Plus at least 2 times a day.  Continue bowel regimen.  Monitoring, evaluation, goals: Patient will tolerate adequate calories and protein to minimize weight loss.  Next visit: Thursday, January 8 during infusion.  **Disclaimer: This note was dictated with voice recognition software. Similar sounding words can inadvertently be transcribed and this note may contain transcription errors which may not have been corrected upon publication of note.**

## 2024-04-24 NOTE — Patient Instructions (Addendum)
 [facility] - A DEPT OF [facility]   Discharge Instructions: Thank you for choosing [facility] to provide your oncology and hematology care.   If you have a lab appointment with the Cancer Center, please go directly to the Cancer Center and check in at the registration area.   Wear comfortable clothing and clothing appropriate for easy access to any Portacath or PICC line.   We strive to give you quality time with your provider. You may need to reschedule your appointment if you arrive late (15 or more minutes).  Arriving late affects you and other patients whose appointments are after yours.  Also, if you miss three or more appointments without notifying the office, you may be dismissed from the clinic at the provider's discretion.      For prescription refill requests, have your pharmacy contact our office and allow 72 hours for refills to be completed.    Today you received the following chemotherapy and/or immunotherapy agents: Oxaliplatin , Leucovorin , and Fluorouracil        To help prevent nausea and vomiting after your treatment, we encourage you to take your nausea medication as directed.  BELOW ARE SYMPTOMS THAT SHOULD BE REPORTED IMMEDIATELY: *FEVER GREATER THAN 100.4 F (38 C) OR HIGHER *CHILLS OR SWEATING *NAUSEA AND VOMITING THAT IS NOT CONTROLLED WITH YOUR NAUSEA MEDICATION *UNUSUAL SHORTNESS OF BREATH *UNUSUAL BRUISING OR BLEEDING *URINARY PROBLEMS (pain or burning when urinating, or frequent urination) *BOWEL PROBLEMS (unusual diarrhea, constipation, pain near the anus) TENDERNESS IN MOUTH AND THROAT WITH OR WITHOUT PRESENCE OF ULCERS (sore throat, sores in mouth, or a toothache) UNUSUAL RASH, SWELLING OR PAIN  UNUSUAL VAGINAL DISCHARGE OR ITCHING   Items with * indicate a potential emergency and should be followed up as soon as possible or go to the Emergency Department if any problems should occur.  Please show the  CHEMOTHERAPY ALERT CARD or IMMUNOTHERAPY ALERT CARD at check-in to the Emergency Department and triage nurse.  Should you have questions after your visit or need to cancel or reschedule your appointment, please contact [facility] - A [facility]  Dept: [phone]  and follow the prompts.  Office hours are 8:00 a.m. to 4:30 p.m. Monday - Friday. Please note that voicemails left after 4:00 p.m. may not be returned until the following business day.  We are closed weekends and major holidays. You have access to a nurse at all times for urgent questions. Please call the main number to the clinic Dept: [phone] and follow the prompts.   For any non-urgent questions, you may also contact your provider using MyChart. We now offer e-Visits for anyone 27 and older to request care online for non-urgent symptoms. For details visit [email or web address].   Also download the MyChart app! Go to the app store, search MyChart, open the app, select [facility], and log in with your MyChart username and password.   The chemotherapy medication bag should finish at 46 hours, 96 hours, or 7 days. For example, if your pump is scheduled for 46 hours and it was put on at 4:00 p.m., it should finish at 2:00 p.m. the day it is scheduled to come off regardless of your appointment time.     Estimated time to finish at 3pm 04/26/24    If the display on your pump reads Low Volume and it is beeping, take the batteries out of the pump and  come to the cancer center for it to be taken off.   If the pump alarms go off prior to the pump reading Low Volume then call [phone] and someone can assist you.  If the plunger comes out and the chemotherapy medication is leaking out, please use your home chemo spill kit to clean up the spill. Do NOT use paper towels or other household products.  If you have problems or questions regarding your pump, please call either  [phone] (24 hours a day) or the cancer center Monday-Friday 8:00 a.m.- 4:30 p.m. at the clinic number and we will assist you. If you are unable to get assistance, then go to the nearest Emergency Department and ask the staff to contact the IV team for assistance.

## 2024-04-26 ENCOUNTER — Other Ambulatory Visit (HOSPITAL_BASED_OUTPATIENT_CLINIC_OR_DEPARTMENT_OTHER): Payer: Self-pay

## 2024-04-26 ENCOUNTER — Inpatient Hospital Stay

## 2024-04-26 VITALS — BP 129/73 | HR 108 | Resp 18

## 2024-04-26 DIAGNOSIS — C169 Malignant neoplasm of stomach, unspecified: Secondary | ICD-10-CM

## 2024-04-26 DIAGNOSIS — Z5111 Encounter for antineoplastic chemotherapy: Secondary | ICD-10-CM | POA: Diagnosis not present

## 2024-04-26 MED ORDER — PEGFILGRASTIM INJECTION 6 MG/0.6ML ~~LOC~~
6.0000 mg | PREFILLED_SYRINGE | Freq: Once | SUBCUTANEOUS | Status: AC
  Administered 2024-04-26: 6 mg via SUBCUTANEOUS
  Filled 2024-04-26: qty 0.6

## 2024-04-27 ENCOUNTER — Other Ambulatory Visit: Payer: Self-pay

## 2024-04-27 ENCOUNTER — Ambulatory Visit: Admitting: Endocrinology

## 2024-04-30 ENCOUNTER — Inpatient Hospital Stay: Admitting: Hematology

## 2024-04-30 ENCOUNTER — Inpatient Hospital Stay

## 2024-05-02 ENCOUNTER — Inpatient Hospital Stay

## 2024-05-06 NOTE — Assessment & Plan Note (Addendum)
 pT1bN1M0 stage IB - Patient has chronic stomach issue after surgery for bowel obstruction in January 2021.  Routine screening EGD in July 2025 showed a 5 cm gastric polyps in distal stomach and a biopsy showed high-grade dysplasia and a small focus of adenocarcinoma in situ.  Staging CT scan was negative.  -Status post distal partial gastrostomy on January 11, 2024, which showed 1 cm invasive well to moderately differentiated adenocarcinoma, 1 out of 20 positive lymph node, margins are clear. -given the D2 node dissection, no adjuvant radiation needed, I recommend adjuvant chemotherapy CAPOX for 6 months - She tolerated the second cycle CapeOx poorly, chemo changed to FOLFOX from cycle 3. -Signatera was negative in 02/2024

## 2024-05-07 ENCOUNTER — Inpatient Hospital Stay

## 2024-05-07 ENCOUNTER — Inpatient Hospital Stay: Admitting: Hematology

## 2024-05-07 VITALS — BP 118/66 | HR 98 | Temp 98.5°F | Resp 18 | Ht 63.0 in | Wt 146.5 lb

## 2024-05-07 DIAGNOSIS — C169 Malignant neoplasm of stomach, unspecified: Secondary | ICD-10-CM | POA: Diagnosis not present

## 2024-05-07 DIAGNOSIS — E860 Dehydration: Secondary | ICD-10-CM

## 2024-05-07 DIAGNOSIS — Z5111 Encounter for antineoplastic chemotherapy: Secondary | ICD-10-CM | POA: Diagnosis not present

## 2024-05-07 LAB — CBC WITH DIFFERENTIAL (CANCER CENTER ONLY)
Abs Immature Granulocytes: 0.08 K/uL — ABNORMAL HIGH (ref 0.00–0.07)
Basophils Absolute: 0 K/uL (ref 0.0–0.1)
Basophils Relative: 1 %
Eosinophils Absolute: 0.1 K/uL (ref 0.0–0.5)
Eosinophils Relative: 1 %
HCT: 32.6 % — ABNORMAL LOW (ref 36.0–46.0)
Hemoglobin: 11 g/dL — ABNORMAL LOW (ref 12.0–15.0)
Immature Granulocytes: 1 %
Lymphocytes Relative: 61 %
Lymphs Abs: 3.8 K/uL (ref 0.7–4.0)
MCH: 29.9 pg (ref 26.0–34.0)
MCHC: 33.7 g/dL (ref 30.0–36.0)
MCV: 88.6 fL (ref 80.0–100.0)
Monocytes Absolute: 0.8 K/uL (ref 0.1–1.0)
Monocytes Relative: 13 %
Neutro Abs: 1.4 K/uL — ABNORMAL LOW (ref 1.7–7.7)
Neutrophils Relative %: 23 %
Platelet Count: 114 K/uL — ABNORMAL LOW (ref 150–400)
RBC: 3.68 MIL/uL — ABNORMAL LOW (ref 3.87–5.11)
RDW: 18.5 % — ABNORMAL HIGH (ref 11.5–15.5)
Smear Review: NORMAL
WBC Count: 6.3 K/uL (ref 4.0–10.5)
nRBC: 0 % (ref 0.0–0.2)

## 2024-05-07 LAB — CMP (CANCER CENTER ONLY)
ALT: 14 U/L (ref 0–44)
AST: 24 U/L (ref 15–41)
Albumin: 4.4 g/dL (ref 3.5–5.0)
Alkaline Phosphatase: 101 U/L (ref 38–126)
Anion gap: 12 (ref 5–15)
BUN: 12 mg/dL (ref 6–20)
CO2: 24 mmol/L (ref 22–32)
Calcium: 10.2 mg/dL (ref 8.9–10.3)
Chloride: 105 mmol/L (ref 98–111)
Creatinine: 0.95 mg/dL (ref 0.44–1.00)
GFR, Estimated: >60 mL/min
Glucose, Bld: 124 mg/dL — ABNORMAL HIGH (ref 70–99)
Potassium: 4.4 mmol/L (ref 3.5–5.1)
Sodium: 141 mmol/L (ref 135–145)
Total Bilirubin: 0.8 mg/dL (ref 0.0–1.2)
Total Protein: 8.1 g/dL (ref 6.5–8.1)

## 2024-05-07 MED ORDER — DEXAMETHASONE SOD PHOSPHATE PF 10 MG/ML IJ SOLN
10.0000 mg | Freq: Once | INTRAMUSCULAR | Status: AC
  Administered 2024-05-07: 10 mg via INTRAVENOUS

## 2024-05-07 MED ORDER — DEXTROSE 5 % IV SOLN: INTRAVENOUS | Status: DC

## 2024-05-07 MED ORDER — ALTEPLASE 2 MG IJ SOLR
2.0000 mg | Freq: Once | INTRAMUSCULAR | Status: AC | PRN
  Administered 2024-05-07: 2 mg
  Filled 2024-05-07: qty 2

## 2024-05-07 MED ORDER — LEUCOVORIN CALCIUM INJECTION 350 MG
400.0000 mg/m2 | Freq: Once | INTRAVENOUS | Status: AC
  Administered 2024-05-07: 700 mg via INTRAVENOUS
  Filled 2024-05-07: qty 35

## 2024-05-07 MED ORDER — SODIUM CHLORIDE 0.9 % IV SOLN
2400.0000 mg/m2 | INTRAVENOUS | Status: DC
  Administered 2024-05-07: 4200 mg via INTRAVENOUS
  Filled 2024-05-07: qty 84

## 2024-05-07 MED ORDER — PALONOSETRON HCL INJECTION 0.25 MG/5ML
0.2500 mg | Freq: Once | INTRAVENOUS | Status: AC
  Administered 2024-05-07: 0.25 mg via INTRAVENOUS
  Filled 2024-05-07: qty 5

## 2024-05-07 MED ADMIN — Fluorouracil IV Soln 2.5 GM/50ML (50 MG/ML): 700 mg | INTRAVENOUS | NDC 71288015476

## 2024-05-07 MED ADMIN — OXALIPLATIN CHEMO IV INFUSION: 150 mg | INTRAVENOUS | NDC 60505613207

## 2024-05-07 MED FILL — Oxaliplatin IV Soln 100 MG/20ML: 85.0000 mg/m2 | INTRAVENOUS | Qty: 20 | Status: AC

## 2024-05-07 MED FILL — Fluorouracil IV Soln 5 GM/100ML (50 MG/ML): 400.0000 mg/m2 | INTRAVENOUS | Qty: 14 | Status: AC

## 2024-05-07 NOTE — Progress Notes (Signed)
 " [facility]   [phone] [phone]   Clinic Follow up Note   Patient Care Team: Nche, Roselie Rockford, NP as PCP - General (Internal Medicine) Waddell Danelle ORN, MD as PCP - Electrophysiology (Cardiology) Verlin Lonni BIRCH, MD as PCP - Cardiology (Cardiology) Prescilla Beams, MD as Consulting Physician (Nephrology) Waddell Danelle ORN, MD as Consulting Physician (Cardiology) Gloriann Chick, MD as Consulting Physician (Obstetrics and Gynecology) Camillo Golas, MD as Attending Physician (Ophthalmology) Lanny Callander, MD as Consulting Physician (Hematology and Oncology)  Date of Service:  05/07/2024  CHIEF COMPLAINT: f/u of gastric cancer  CURRENT THERAPY:  Treatment FOLFOX  Oncology History   Gastric adenocarcinoma [facility]) pT1bN1M0 stage IB - Patient has chronic stomach issue after surgery for bowel obstruction in January 2021.  Routine screening EGD in July 2025 showed a 5 cm gastric polyps in distal stomach and a biopsy showed high-grade dysplasia and a small focus of adenocarcinoma in situ.  Staging CT scan was negative.  -Status post distal partial gastrostomy on January 11, 2024, which showed 1 cm invasive well to moderately differentiated adenocarcinoma, 1 out of 20 positive lymph node, margins are clear. -given the D2 node dissection, no adjuvant radiation needed, I recommend adjuvant chemotherapy CAPOX for 6 months - She tolerated the second cycle CapeOx poorly, chemo changed to FOLFOX from cycle 3. -Signatera was negative in 02/2024  Assessment & Plan Gastric adenocarcinoma Undergoing adjuvant chemotherapy for gastric adenocarcinoma, currently halfway through the planned course. She has experienced side effects--specifically oral mucositis and a brief fever--all managed with supportive care. No neuropathy or significant complications. Most recent Signatera test was negative for tumor DNA. She remains clinically able to continue chemotherapy. -  Continued adjuvant chemotherapy as tolerated; next two cycles scheduled. - Ordered PET/CT for surveillance next month (last scan July; imaging every 6 months). - Ordered next Signatera test for January to monitor for early recurrence via tumor DNA. - Provided supportive care recommendations: acetaminophen  for fever/myalgias, mouthwash for oral mucositis. - Instructed her to monitor for new or worsening symptoms, including fever, chills, or infection, and to use home COVID/flu tests if fever recurs. - Reviewed and confirmed adequate prescriptions for supportive medications, including antiemetics and potassium. - Will provide documentation of chemotherapy duration at next visit for administrative purposes. - Scheduled follow-up in 2 weeks with NP and in 4 weeks with me after CT scan.  Chemotherapy-induced anemia and thrombocytopenia Mild chemotherapy-induced thrombocytopenia with platelet count of 114,000, sufficient to proceed with chemotherapy. Other blood counts and organ function remain stable. - Monitored complete blood count prior to chemotherapy; platelet count sufficient for treatment.  Plan - Lab reviewed, adequate for treatment, will proceed with cycle 3 FOLFOX today - Follow-up in 2 weeks before cycle 4 - Surveillance CT scan in 3 weeks   SUMMARY OF ONCOLOGIC HISTORY: Oncology History  Gastric adenocarcinoma (HCC)  11/30/2023 Initial Diagnosis   Gastric adenocarcinoma (HCC)   01/11/2024 Cancer Staging   Staging form: Stomach, AJCC 8th Edition - Pathologic stage from 01/11/2024: Stage IB (pT1b, pN1, cM0) - Signed by Lanny Callander, MD on 01/25/2024 Histologic grade (G): G2 Histologic grading system: 3 grade system Residual tumor (R): R0   02/17/2024 - 03/08/2024 Chemotherapy   Patient is on Treatment Plan : GASTRIC CapeOx (1000/130) q21d x 8 Cycles      04/02/2024 -  Chemotherapy   Patient is on Treatment Plan : GASTRIC FOLFOX q14d x 12 cycles        Discussed the use of  AI  scribe software for clinical note transcription with the patient, who gave verbal consent to proceed.  History of Present Illness Susan Whitaker is a 53 year old female with gastric cancer undergoing adjuvant chemotherapy who presents for follow-up to assess treatment tolerance and address recent mild symptoms.  She has tolerated chemotherapy well with only mild side effects since her last cycle.  After her most recent chemotherapy session she had mild injection site pain, rated 4-5/10, controlled with acetaminophen  and loratadine, and mild oral mucositis that improved with mouthwash.  Yesterday she had a low-grade fever to 100.28F with chills but no rigors, myalgias, sore throat, cough, or known sick contacts. The fever resolved within three hours after acetaminophen . She has had rhinorrhea for two days. She received a seasonal influenza vaccine.  She denies neuropathy, paresthesia, extremity edema, or difficulty with fine motor tasks. She has not used cryotherapy during infusions. No new or worsening symptoms were noted.  She continues potassium supplementation with a recent dose reduction due to stable levels. She has access to antiemetics and is also taking anastrozole, managed by her primary care provider.  She is undergoing surveillance with CT imaging and Signatera testing. The last Signatera in October was negative for tumor DNA and the last CT scan in July showed no actionable findings relevant to today's visit. She requested a letter documenting chemotherapy duration to be provided at her next visit.     All other systems were reviewed with the patient and are negative.  MEDICAL HISTORY:  Past Medical History:  Diagnosis Date   Abdominal bloating 09/14/2020   Abdominal pain 10/04/2020   Allergy     Cancer (HCC) 2025   Gastric Cancer   Class 2 obesity due to excess calories with body mass index (BMI) of 35.0 to 35.9 in adult 06/2010   DM type 2 (diabetes mellitus, type 2) (HCC)     Dysrhythmia    SVT s/p ablation   Gastric ulcer    GERD (gastroesophageal reflux disease)    H/O constipation    H/O hemorrhoids    History of small bowel obstruction 06/02/2019   Hyperlipidemia    Hypertension    Hypokalemia 2019   Menorrhagia    Microalbuminuria 02/15/2012   Primary hyperaldosteronism 02/21/2012   S/P radiofrequency ablation operation for arrhythmia 10/03/20 10/04/2020   SVT (supraventricular tachycardia)    Noted 11/2011 admission   SVT (supraventricular tachycardia) 12/29/2011   Thrombocytopenia    Type 2 diabetes mellitus with obesity 11/17/2020   IMO SNOMED Dx Update Oct 2024     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    SURGICAL HISTORY: Past Surgical History:  Procedure Laterality Date   ABDOMINAL ADHESION SURGERY  06/03/2019   Dr Curvin   ABDOMINAL SURGERY     24 months of age-- unsure of type of surgery   COLONOSCOPY  04/2016   hemorrhoids--normal per pt with Dr Kristie   DILATATION & CURETTAGE/HYSTEROSCOPY WITH MYOSURE N/A 02/08/2020   Procedure: DILATATION & CURETTAGE/HYSTEROSCOPY WITH MYOSURE;  Surgeon: Gloriann Chick, MD;  Location: [facility];  Service: Gynecology;  Laterality: N/A;   ESOPHAGOGASTRODUODENOSCOPY N/A 12/26/2023   Procedure: EGD (ESOPHAGOGASTRODUODENOSCOPY);  Surgeon: Wilhelmenia Aloha Raddle., MD;  Location: [facility];  Service: Gastroenterology;  Laterality: N/A;   EUS N/A 12/26/2023   Procedure: ULTRASOUND, UPPER GI TRACT, ENDOSCOPIC;  Surgeon: Wilhelmenia Aloha Raddle., MD;  Location: [facility];  Service: Gastroenterology;  Laterality: N/A;   HEMORRHOID SURGERY  2005/2006   INTRAUTERINE DEVICE (IUD) INSERTION N/A 02/08/2020  Procedure: INTRAUTERINE DEVICE (IUD) INSERTION UNDER ULTRASOUND GUIDANCE;  Surgeon: Gloriann Chick, MD;  Location: [facility];  Service: Gynecology;  Laterality: N/A;  Mirena    LAPAROSCOPY N/A 01/11/2024   Procedure: LAPAROSCOPY, DIAGNOSTIC;  Surgeon: Aron Shoulders, MD;  Location: [facility];  Service:  General;  Laterality: N/A;   LAPAROTOMY N/A 06/03/2019   Procedure: EXPLORATORY LAPAROTOMY WITH LYSIS OF ADHESIONS;  Surgeon: Curvin Deward MOULD, MD;  Location: [facility];  Service: General;  Laterality: N/A;   PARTIAL GASTRECTOMY N/A 01/11/2024   Procedure: GASTRECTOMY, PARTIAL;  Surgeon: Aron Shoulders, MD;  Location: [facility];  Service: General;  Laterality: N/A;  DISTAL GASTRECTOMY   PORTACATH PLACEMENT N/A 02/15/2024   Procedure: INSERTION, TUNNELED CENTRAL VENOUS DEVICE, WITH PORT;  Surgeon: Aron Shoulders, MD;  Location: [facility];  Service: General;  Laterality: N/A;   SVT ABLATION N/A 10/03/2020   Procedure: SVT ABLATION;  Surgeon: Waddell Danelle ORN, MD;  Location: [facility];  Service: Cardiovascular;  Laterality: N/A;   UPPER GASTROINTESTINAL ENDOSCOPY     UTERINE FIBROID EMBOLIZATION  2010   at [facility]    I have reviewed the social history and family history with the patient and they are unchanged from previous note.  ALLERGIES:  is allergic to lisinopril.  MEDICATIONS:  Current Outpatient Medications  Medication Sig Dispense Refill   acetaminophen  (TYLENOL ) 325 MG tablet Take 2 tablets (650 mg total) by mouth every 4 (four) hours as needed for headache or mild pain.     acetaminophen  (TYLENOL ) 500 MG tablet Take 2 tablets (1,000 mg total) by mouth every 8 (eight) hours. 30 tablet 0   amLODipine  (NORVASC ) 5 MG tablet Take 1 tablet (5 mg total) by mouth daily. 90 tablet 1   atorvastatin  (LIPITOR) 80 MG tablet Take 1 tablet (80 mg total) by mouth daily. 90 tablet 3   blood glucose meter kit and supplies Use in the morning, afternoon, and at bedtime. 1 each 0   Blood Glucose Monitoring Suppl (BLOOD GLUCOSE MONITOR SYSTEM) w/Device KIT Use to check blood sugar 3 times daily. 1 kit 0   dexamethasone  (DECADRON ) 4 MG tablet Take 2 tablets (8 mg total) by mouth daily. Start the day after chemotherapy for 2 days. Take with food. 30 tablet 1   diphenhydrAMINE -nystatin -alum & mag  hydroxide-simeth-lidocaine  Take by mouth 4 (four) times daily as needed for mouth pain. 140 mL 1   feeding supplement (ENSURE PLUS HIGH PROTEIN) LIQD Take 237 mLs by mouth 2 (two) times daily between meals.     gabapentin  (NEURONTIN ) 100 MG capsule Take 2 capsules (200 mg total) by mouth 3 (three) times daily. 180 capsule 0   glucose blood (ACCU-CHEK GUIDE) test strip Use as instructed to check once daily. 50 each 12   Glucose Blood (BLOOD GLUCOSE TEST STRIPS) STRP Use 1 each daily to test blood glucose levels. 100 each 3   Glucose Blood (BLOOD GLUCOSE TEST STRIPS) STRP Use 1 each at morning, noon, and bedtime to check blood glucose. 100 each 3   levonorgestrel  (MIRENA , 52 MG,) 20 MCG/DAY IUD Mirena  20 mcg/24 hours (7 yrs) 52 mg intrauterine device  Take by intrauterine route.     lidocaine -prilocaine  (EMLA ) cream Apply to affected area once. 30 g 3   methocarbamol  (ROBAXIN ) 500 MG tablet Take 1 tablet (500 mg total) by mouth every 6 (six) hours as needed for muscle spasms. 90 tablet 1   ondansetron  (ZOFRAN ) 8 MG tablet Take 1 tablet (8 mg total) by mouth  every 8 (eight) hours as needed for nausea or vomiting. Start on the third day after chemotherapy. 30 tablet 1   ondansetron  (ZOFRAN -ODT) 4 MG disintegrating tablet Dissolve 1 tablet (4 mg total) by mouth every 6 (six) hours as needed for nausea. 20 tablet 2   ONETOUCH DELICA LANCETS FINE MISC Use to check blood sugar 3 times daily 100 each 3   oxyCODONE  (OXY IR/ROXICODONE ) 5 MG immediate release tablet Take 1-2 tablets (5-10 mg total) by mouth every 4 (four) hours as needed for moderate pain (pain score 4-6). 40 tablet 0   pantoprazole  (PROTONIX ) 40 MG tablet Take 1 tablet (40 mg total) by mouth daily. 90 tablet 3   polyethylene glycol (MIRALAX  / GLYCOLAX ) 17 g packet Take 17 g by mouth daily.     potassium chloride  SA (KLOR-CON  M) 20 MEQ tablet Take 2 tablets (40 mEq total) by mouth 2 (two) times daily. Correction to previous prescription 120  tablet 1   Probiotic Product (ULTRAFLORA IMMUNE HEALTH PO) Take 1 capsule by mouth daily.     prochlorperazine  (COMPAZINE ) 10 MG tablet Take 1 tablet (10 mg total) by mouth every 6 (six) hours as needed for nausea or vomiting. 30 tablet 1   No current facility-administered medications for this visit.    PHYSICAL EXAMINATION: ECOG PERFORMANCE STATUS: 1 - Symptomatic but completely ambulatory  There were no vitals filed for this visit. Wt Readings from Last 3 Encounters:  05/07/24 146 lb 8 oz (66.5 kg)  04/24/24 149 lb 1.6 oz (67.6 kg)  04/16/24 151 lb 1.6 oz (68.5 kg)     GENERAL:alert, no distress and comfortable SKIN: skin color, texture, turgor are normal, no rashes or significant lesions EYES: normal, Conjunctiva are pink and non-injected, sclera clear NECK: supple, thyroid  normal size, non-tender, without nodularity LYMPH:  no palpable lymphadenopathy in the cervical, axillary  LUNGS: clear to auscultation and percussion with normal breathing effort HEART: regular rate & rhythm and no murmurs and no lower extremity edema ABDOMEN:abdomen soft, non-tender and normal bowel sounds Musculoskeletal:no cyanosis of digits and no clubbing  NEURO: alert & oriented x 3 with fluent speech, no focal motor/sensory deficits  Physical Exam HEENT: Tongue without lesions.  LABORATORY DATA:  I have reviewed the data as listed    Latest Ref Rng & Units 05/07/2024    9:37 AM 04/24/2024   11:05 AM 04/16/2024   10:24 AM  CBC  WBC 4.0 - 10.5 K/uL 6.3  8.4  2.1   Hemoglobin 12.0 - 15.0 g/dL 88.9  88.7  89.4   Hematocrit 36.0 - 46.0 % 32.6  33.4  30.4   Platelets 150 - 400 K/uL 114  388  165         Latest Ref Rng & Units 05/07/2024    9:37 AM 04/24/2024   11:05 AM 04/16/2024   10:24 AM  CMP  Glucose 70 - 99 mg/dL 875  870  883   BUN 6 - 20 mg/dL 12  5  10    Creatinine 0.44 - 1.00 mg/dL 9.04  9.27  9.25   Sodium 135 - 145 mmol/L 141  144  143   Potassium 3.5 - 5.1 mmol/L 4.4  4.0  3.0    Chloride 98 - 111 mmol/L 105  106  105   CO2 22 - 32 mmol/L 24  27  28    Calcium  8.9 - 10.3 mg/dL 89.7  89.8  9.6   Total Protein 6.5 - 8.1 g/dL 8.1  8.0  7.5   Total Bilirubin 0.0 - 1.2 mg/dL 0.8  0.6  0.7   Alkaline Phos 38 - 126 U/L 101  68  73   AST 15 - 41 U/L 24  27  27    ALT 0 - 44 U/L 14  13  20        RADIOGRAPHIC STUDIES: I have personally reviewed the radiological images as listed and agreed with the findings in the report. No results found.    Orders Placed This Encounter  Procedures   CT CHEST ABDOMEN PELVIS W CONTRAST    Standing Status:   Future    Expected Date:   05/28/2024    Expiration Date:   05/07/2025    If indicated for the ordered procedure, I authorize the administration of contrast media per Radiology protocol:   Yes    Does the patient have a contrast media/X-ray dye allergy ?:   No    Preferred imaging location?:   [facility]    If indicated for the ordered procedure, I authorize the administration of oral contrast media per Radiology protocol:   Yes   CBC with Differential (Cancer Center Only)    Standing Status:   Future    Expected Date:   06/18/2024    Expiration Date:   06/18/2025   CMP (Cancer Center only)    Standing Status:   Future    Expected Date:   06/18/2024    Expiration Date:   06/18/2025   CBC with Differential (Cancer Center Only)    Standing Status:   Future    Expected Date:   07/02/2024    Expiration Date:   07/02/2025   CMP (Cancer Center only)    Standing Status:   Future    Expected Date:   07/02/2024    Expiration Date:   07/02/2025   CBC with Differential (Cancer Center Only)    Standing Status:   Future    Expected Date:   07/16/2024    Expiration Date:   07/16/2025   CMP (Cancer Center only)    Standing Status:   Future    Expected Date:   07/16/2024    Expiration Date:   07/16/2025   All questions were answered. The patient knows to call the clinic with any problems, questions or concerns. No barriers to learning was  detected. The total time spent in the appointment was 25 minutes, including review of chart and various tests results, discussions about plan of care and coordination of care plan     Onita Mattock, MD 05/07/2024     "

## 2024-05-07 NOTE — Patient Instructions (Signed)
 [facility] - A DEPT OF [facility]  Discharge Instructions: Thank you for choosing [facility] to provide your oncology and hematology care.   If you have a lab appointment with the Cancer Center, please go directly to the Cancer Center and check in at the registration area.   Wear comfortable clothing and clothing appropriate for easy access to any Portacath or PICC line.   We strive to give you quality time with your provider. You may need to reschedule your appointment if you arrive late (15 or more minutes).  Arriving late affects you and other patients whose appointments are after yours.  Also, if you miss three or more appointments without notifying the office, you may be dismissed from the clinic at the provider's discretion.      For prescription refill requests, have your pharmacy contact our office and allow 72 hours for refills to be completed.    Today you received the following chemotherapy and/or immunotherapy agents:Oxaliplatin  (Eloxatin ), Leucovorin , & Fluorouracil  (Adrucil )    To help prevent nausea and vomiting after your treatment, we encourage you to take your nausea medication as directed.  BELOW ARE SYMPTOMS THAT SHOULD BE REPORTED IMMEDIATELY: *FEVER GREATER THAN 100.4 F (38 C) OR HIGHER *CHILLS OR SWEATING *NAUSEA AND VOMITING THAT IS NOT CONTROLLED WITH YOUR NAUSEA MEDICATION *UNUSUAL SHORTNESS OF BREATH *UNUSUAL BRUISING OR BLEEDING *URINARY PROBLEMS (pain or burning when urinating, or frequent urination) *BOWEL PROBLEMS (unusual diarrhea, constipation, pain near the anus) TENDERNESS IN MOUTH AND THROAT WITH OR WITHOUT PRESENCE OF ULCERS (sore throat, sores in mouth, or a toothache) UNUSUAL RASH, SWELLING OR PAIN  UNUSUAL VAGINAL DISCHARGE OR ITCHING   Items with * indicate a potential emergency and should be followed up as soon as possible or go to the Emergency Department if any problems should occur.  Please  show the CHEMOTHERAPY ALERT CARD or IMMUNOTHERAPY ALERT CARD at check-in to the Emergency Department and triage nurse.  Should you have questions after your visit or need to cancel or reschedule your appointment, please contact [facility] - A DEPT OF [facility]  Dept: [phone]  and follow the prompts.  Office hours are 8:00 a.m. to 4:30 p.m. Monday - Friday. Please note that voicemails left after 4:00 p.m. may not be returned until the following business day.  We are closed weekends and major holidays. You have access to a nurse at all times for urgent questions. Please call the main number to the clinic Dept: [phone] and follow the prompts.   For any non-urgent questions, you may also contact your provider using MyChart. We now offer e-Visits for anyone 25 and older to request care online for non-urgent symptoms. For details visit [email or web address].   Also download the MyChart app! Go to the app store, search MyChart, open the app, select [facility], and log in with your MyChart username and password.

## 2024-05-07 NOTE — Progress Notes (Signed)
 Cathflo removed at 1004 with adequate blood return. Port flushed and pt started on maintenance fluid.

## 2024-05-08 ENCOUNTER — Other Ambulatory Visit: Payer: Self-pay

## 2024-05-09 ENCOUNTER — Inpatient Hospital Stay

## 2024-05-09 VITALS — BP 123/75 | HR 99 | Temp 98.4°F | Resp 15

## 2024-05-09 DIAGNOSIS — C169 Malignant neoplasm of stomach, unspecified: Secondary | ICD-10-CM

## 2024-05-09 DIAGNOSIS — Z5111 Encounter for antineoplastic chemotherapy: Secondary | ICD-10-CM | POA: Diagnosis not present

## 2024-05-09 MED ORDER — PEGFILGRASTIM INJECTION 6 MG/0.6ML ~~LOC~~
6.0000 mg | PREFILLED_SYRINGE | Freq: Once | SUBCUTANEOUS | Status: AC
  Administered 2024-05-09: 6 mg via SUBCUTANEOUS
  Filled 2024-05-09: qty 0.6

## 2024-05-14 ENCOUNTER — Inpatient Hospital Stay

## 2024-05-14 ENCOUNTER — Inpatient Hospital Stay: Admitting: Nurse Practitioner

## 2024-05-14 ENCOUNTER — Encounter: Payer: Self-pay | Admitting: Hematology

## 2024-05-15 ENCOUNTER — Other Ambulatory Visit (HOSPITAL_BASED_OUTPATIENT_CLINIC_OR_DEPARTMENT_OTHER): Payer: Self-pay

## 2024-05-15 ENCOUNTER — Encounter: Payer: Self-pay | Admitting: Nurse Practitioner

## 2024-05-15 ENCOUNTER — Other Ambulatory Visit: Payer: Self-pay

## 2024-05-15 ENCOUNTER — Encounter: Payer: Self-pay | Admitting: Hematology

## 2024-05-15 MED ORDER — NYSTATIN 100000 UNIT/ML MT SUSP
5.0000 mL | Freq: Four times a day (QID) | OROMUCOSAL | 1 refills | Status: AC | PRN
  Filled 2024-05-15: qty 140, 7d supply, fill #0

## 2024-05-16 ENCOUNTER — Inpatient Hospital Stay

## 2024-05-18 ENCOUNTER — Other Ambulatory Visit: Payer: Self-pay

## 2024-05-18 ENCOUNTER — Inpatient Hospital Stay (HOSPITAL_BASED_OUTPATIENT_CLINIC_OR_DEPARTMENT_OTHER)
Admission: EM | Admit: 2024-05-18 | Discharge: 2024-05-28 | DRG: 871 | Disposition: A | Discharge status: Home / Self Care | Attending: Internal Medicine | Admitting: Internal Medicine

## 2024-05-18 ENCOUNTER — Encounter (HOSPITAL_BASED_OUTPATIENT_CLINIC_OR_DEPARTMENT_OTHER): Payer: Self-pay

## 2024-05-18 ENCOUNTER — Emergency Department (HOSPITAL_BASED_OUTPATIENT_CLINIC_OR_DEPARTMENT_OTHER)

## 2024-05-18 ENCOUNTER — Telehealth: Payer: Self-pay

## 2024-05-18 DIAGNOSIS — D61818 Other pancytopenia: Secondary | ICD-10-CM | POA: Insufficient documentation

## 2024-05-18 DIAGNOSIS — E8721 Acute metabolic acidosis: Secondary | ICD-10-CM | POA: Diagnosis present

## 2024-05-18 DIAGNOSIS — K219 Gastro-esophageal reflux disease without esophagitis: Secondary | ICD-10-CM | POA: Diagnosis present

## 2024-05-18 DIAGNOSIS — Z801 Family history of malignant neoplasm of trachea, bronchus and lung: Secondary | ICD-10-CM

## 2024-05-18 DIAGNOSIS — E785 Hyperlipidemia, unspecified: Secondary | ICD-10-CM | POA: Diagnosis present

## 2024-05-18 DIAGNOSIS — E870 Hyperosmolality and hypernatremia: Secondary | ICD-10-CM | POA: Diagnosis present

## 2024-05-18 DIAGNOSIS — K1230 Oral mucositis (ulcerative), unspecified: Secondary | ICD-10-CM

## 2024-05-18 DIAGNOSIS — E43 Unspecified severe protein-calorie malnutrition: Secondary | ICD-10-CM | POA: Diagnosis present

## 2024-05-18 DIAGNOSIS — R6510 Systemic inflammatory response syndrome (SIRS) of non-infectious origin without acute organ dysfunction: Secondary | ICD-10-CM

## 2024-05-18 DIAGNOSIS — D696 Thrombocytopenia, unspecified: Secondary | ICD-10-CM

## 2024-05-18 DIAGNOSIS — Z888 Allergy status to other drugs, medicaments and biological substances status: Secondary | ICD-10-CM

## 2024-05-18 DIAGNOSIS — Z79899 Other long term (current) drug therapy: Secondary | ICD-10-CM

## 2024-05-18 DIAGNOSIS — E860 Dehydration: Secondary | ICD-10-CM | POA: Diagnosis present

## 2024-05-18 DIAGNOSIS — Z8249 Family history of ischemic heart disease and other diseases of the circulatory system: Secondary | ICD-10-CM

## 2024-05-18 DIAGNOSIS — E7800 Pure hypercholesterolemia, unspecified: Secondary | ICD-10-CM

## 2024-05-18 DIAGNOSIS — Z6822 Body mass index (BMI) 22.0-22.9, adult: Secondary | ICD-10-CM

## 2024-05-18 DIAGNOSIS — E871 Hypo-osmolality and hyponatremia: Secondary | ICD-10-CM | POA: Diagnosis present

## 2024-05-18 DIAGNOSIS — Z975 Presence of (intrauterine) contraceptive device: Secondary | ICD-10-CM

## 2024-05-18 DIAGNOSIS — D701 Agranulocytosis secondary to cancer chemotherapy: Secondary | ICD-10-CM | POA: Diagnosis present

## 2024-05-18 DIAGNOSIS — E119 Type 2 diabetes mellitus without complications: Secondary | ICD-10-CM | POA: Diagnosis present

## 2024-05-18 DIAGNOSIS — Z1152 Encounter for screening for COVID-19: Secondary | ICD-10-CM

## 2024-05-18 DIAGNOSIS — D709 Neutropenia, unspecified: Principal | ICD-10-CM | POA: Diagnosis present

## 2024-05-18 DIAGNOSIS — C169 Malignant neoplasm of stomach, unspecified: Secondary | ICD-10-CM | POA: Diagnosis present

## 2024-05-18 DIAGNOSIS — D61810 Antineoplastic chemotherapy induced pancytopenia: Secondary | ICD-10-CM | POA: Diagnosis present

## 2024-05-18 DIAGNOSIS — B379 Candidiasis, unspecified: Secondary | ICD-10-CM | POA: Diagnosis present

## 2024-05-18 DIAGNOSIS — Z823 Family history of stroke: Secondary | ICD-10-CM

## 2024-05-18 DIAGNOSIS — A419 Sepsis, unspecified organism: Principal | ICD-10-CM | POA: Diagnosis present

## 2024-05-18 DIAGNOSIS — E876 Hypokalemia: Secondary | ICD-10-CM | POA: Diagnosis present

## 2024-05-18 DIAGNOSIS — R5081 Fever presenting with conditions classified elsewhere: Secondary | ICD-10-CM | POA: Diagnosis present

## 2024-05-18 DIAGNOSIS — K1231 Oral mucositis (ulcerative) due to antineoplastic therapy: Secondary | ICD-10-CM | POA: Diagnosis present

## 2024-05-18 DIAGNOSIS — T451X5A Adverse effect of antineoplastic and immunosuppressive drugs, initial encounter: Secondary | ICD-10-CM | POA: Diagnosis present

## 2024-05-18 DIAGNOSIS — Z803 Family history of malignant neoplasm of breast: Secondary | ICD-10-CM

## 2024-05-18 DIAGNOSIS — R6520 Severe sepsis without septic shock: Secondary | ICD-10-CM | POA: Diagnosis present

## 2024-05-18 DIAGNOSIS — I10 Essential (primary) hypertension: Secondary | ICD-10-CM | POA: Diagnosis present

## 2024-05-18 LAB — URINALYSIS, W/ REFLEX TO CULTURE (INFECTION SUSPECTED)
Glucose, UA: 100 mg/dL — AB
Ketones, ur: 40 mg/dL — AB
Leukocytes,Ua: NEGATIVE
Nitrite: NEGATIVE
Protein, ur: >=300 mg/dL — AB
Specific Gravity, Urine: 1.02 (ref 1.005–1.030)
WBC, UA: NONE SEEN WBC/hpf (ref 0–5)
pH: 5.5 (ref 5.0–8.0)

## 2024-05-18 LAB — COMPREHENSIVE METABOLIC PANEL WITH GFR
ALT: 9 U/L (ref 0–44)
AST: 14 U/L — ABNORMAL LOW (ref 15–41)
Albumin: 4.4 g/dL (ref 3.5–5.0)
Alkaline Phosphatase: 73 U/L (ref 38–126)
Anion gap: 20 — ABNORMAL HIGH (ref 5–15)
BUN: 32 mg/dL — ABNORMAL HIGH (ref 6–20)
CO2: 19 mmol/L — ABNORMAL LOW (ref 22–32)
Calcium: 9.4 mg/dL (ref 8.9–10.3)
Chloride: 108 mmol/L (ref 98–111)
Creatinine, Ser: 0.98 mg/dL (ref 0.44–1.00)
GFR, Estimated: >60 mL/min
Glucose, Bld: 192 mg/dL — ABNORMAL HIGH (ref 70–99)
Potassium: 3.9 mmol/L (ref 3.5–5.1)
Sodium: 147 mmol/L — ABNORMAL HIGH (ref 135–145)
Total Bilirubin: 1.5 mg/dL — ABNORMAL HIGH (ref 0.0–1.2)
Total Protein: 8.5 g/dL — ABNORMAL HIGH (ref 6.5–8.1)

## 2024-05-18 LAB — CBC WITH DIFFERENTIAL/PLATELET
Abs Immature Granulocytes: 0 K/uL (ref 0.00–0.07)
Basophils Absolute: 0 K/uL (ref 0.0–0.1)
Basophils Relative: 2 %
Eosinophils Absolute: 0 K/uL (ref 0.0–0.5)
Eosinophils Relative: 0 %
HCT: 30.3 % — ABNORMAL LOW (ref 36.0–46.0)
Hemoglobin: 10.2 g/dL — ABNORMAL LOW (ref 12.0–15.0)
Immature Granulocytes: 0 %
Lymphocytes Relative: 95 %
Lymphs Abs: 0.6 K/uL — ABNORMAL LOW (ref 0.7–4.0)
MCH: 30.1 pg (ref 26.0–34.0)
MCHC: 33.7 g/dL (ref 30.0–36.0)
MCV: 89.4 fL (ref 80.0–100.0)
Monocytes Absolute: 0 K/uL — ABNORMAL LOW (ref 0.1–1.0)
Monocytes Relative: 3 %
Neutro Abs: 0 K/uL — CL (ref 1.7–7.7)
Neutrophils Relative %: 0 %
Platelets: 22 K/uL — CL (ref 150–400)
RBC: 3.39 MIL/uL — ABNORMAL LOW (ref 3.87–5.11)
RDW: 18.9 % — ABNORMAL HIGH (ref 11.5–15.5)
WBC: 0.6 K/uL — CL (ref 4.0–10.5)
nRBC: 0 % (ref 0.0–0.2)

## 2024-05-18 LAB — RESP PANEL BY RT-PCR (RSV, FLU A&B, COVID)  RVPGX2
Influenza A by PCR: NEGATIVE
Influenza B by PCR: NEGATIVE
Resp Syncytial Virus by PCR: NEGATIVE
SARS Coronavirus 2 by RT PCR: NEGATIVE

## 2024-05-18 LAB — LIPASE, BLOOD: Lipase: 14 U/L (ref 11–51)

## 2024-05-18 LAB — CBG MONITORING, ED
Glucose-Capillary: 193 mg/dL — ABNORMAL HIGH (ref 70–99)
Glucose-Capillary: 203 mg/dL — ABNORMAL HIGH (ref 70–99)

## 2024-05-18 LAB — OSMOLALITY: Osmolality: 328 mosm/kg (ref 275–295)

## 2024-05-18 LAB — BETA-HYDROXYBUTYRIC ACID: Beta-Hydroxybutyric Acid: 2.34 mmol/L — ABNORMAL HIGH (ref 0.05–0.27)

## 2024-05-18 LAB — LACTIC ACID, PLASMA
Lactic Acid, Venous: 2.5 mmol/L (ref 0.5–1.9)
Lactic Acid, Venous: 3.4 mmol/L (ref 0.5–1.9)

## 2024-05-18 LAB — AMMONIA: Ammonia: <13 umol/L (ref 9–35)

## 2024-05-18 MED ORDER — VANCOMYCIN HCL IN DEXTROSE 1-5 GM/200ML-% IV SOLN
1000.0000 mg | INTRAVENOUS | Status: DC
  Administered 2024-05-19 – 2024-05-20 (×2): 1000 mg via INTRAVENOUS
  Filled 2024-05-18 (×2): qty 200

## 2024-05-18 MED ORDER — SODIUM CHLORIDE 0.9% IV SOLUTION: Freq: Once | INTRAVENOUS | Status: AC

## 2024-05-18 MED ORDER — SODIUM CHLORIDE 0.9 % IV BOLUS
1000.0000 mL | Freq: Once | INTRAVENOUS | Status: AC
  Administered 2024-05-18: 1000 mL via INTRAVENOUS

## 2024-05-18 MED ORDER — INSULIN ASPART 100 UNIT/ML IJ SOLN
0.0000 [IU] | Freq: Three times a day (TID) | INTRAMUSCULAR | Status: DC
  Administered 2024-05-21 – 2024-05-24 (×3): 1 [IU] via SUBCUTANEOUS
  Filled 2024-05-18 (×3): qty 1

## 2024-05-18 MED ORDER — INSULIN ASPART 100 UNIT/ML IJ SOLN: 0.0000 [IU] | Freq: Every day | INTRAMUSCULAR | Status: DC

## 2024-05-18 MED ORDER — IOHEXOL 350 MG/ML SOLN
80.0000 mL | Freq: Once | INTRAVENOUS | Status: AC | PRN
  Administered 2024-05-18: 80 mL via INTRAVENOUS

## 2024-05-18 MED ORDER — LACTATED RINGERS IV SOLN: INTRAVENOUS | Status: DC

## 2024-05-18 MED ORDER — SODIUM CHLORIDE 0.9 % IV SOLN
2.0000 g | Freq: Once | INTRAVENOUS | Status: AC
  Administered 2024-05-18: 2 g via INTRAVENOUS
  Filled 2024-05-18: qty 12.5

## 2024-05-18 MED ORDER — LACTATED RINGERS IV BOLUS
1000.0000 mL | INTRAVENOUS | Status: AC
  Administered 2024-05-18: 1000 mL via INTRAVENOUS

## 2024-05-18 MED ORDER — VANCOMYCIN HCL IN DEXTROSE 1-5 GM/200ML-% IV SOLN
1000.0000 mg | Freq: Once | INTRAVENOUS | Status: AC
  Administered 2024-05-18: 1000 mg via INTRAVENOUS

## 2024-05-18 MED ORDER — VANCOMYCIN HCL IN DEXTROSE 1-5 GM/200ML-% IV SOLN
1000.0000 mg | Freq: Once | INTRAVENOUS | Status: DC
  Filled 2024-05-18: qty 200

## 2024-05-18 MED ORDER — FILGRASTIM-AAFI 300 MCG/0.5ML IJ SOSY
300.0000 ug | PREFILLED_SYRINGE | Freq: Every day | INTRAMUSCULAR | Status: DC
  Administered 2024-05-19 – 2024-05-23 (×5): 300 ug via SUBCUTANEOUS
  Filled 2024-05-18 (×6): qty 0.5

## 2024-05-18 MED ORDER — METRONIDAZOLE 500 MG/100ML IV SOLN
500.0000 mg | Freq: Once | INTRAVENOUS | Status: AC
  Administered 2024-05-18: 500 mg via INTRAVENOUS
  Filled 2024-05-18: qty 100

## 2024-05-18 MED ORDER — VANCOMYCIN HCL 500 MG IV SOLR
500.0000 mg | Freq: Once | INTRAVENOUS | Status: AC
  Administered 2024-05-18: 500 mg via INTRAVENOUS

## 2024-05-18 MED ORDER — LIDOCAINE VISCOUS HCL 2 % MT SOLN
15.0000 mL | Freq: Once | OROMUCOSAL | Status: AC
  Administered 2024-05-18: 15 mL via ORAL
  Filled 2024-05-18: qty 15

## 2024-05-18 MED ORDER — SODIUM CHLORIDE 0.9 % IV SOLN
2.0000 g | Freq: Two times a day (BID) | INTRAVENOUS | Status: DC
  Administered 2024-05-19: 2 g via INTRAVENOUS
  Filled 2024-05-18: qty 12.5

## 2024-05-18 MED ORDER — ACETAMINOPHEN 325 MG PO TABS
650.0000 mg | ORAL_TABLET | Freq: Four times a day (QID) | ORAL | Status: DC | PRN
  Administered 2024-05-19 – 2024-05-21 (×5): 650 mg via ORAL
  Filled 2024-05-18 (×5): qty 2

## 2024-05-18 MED ORDER — ACETAMINOPHEN 650 MG RE SUPP
650.0000 mg | Freq: Once | RECTAL | Status: AC
  Administered 2024-05-18: 650 mg via RECTAL
  Filled 2024-05-18: qty 1

## 2024-05-18 MED ORDER — ALUM & MAG HYDROXIDE-SIMETH 200-200-20 MG/5ML PO SUSP
30.0000 mL | Freq: Once | ORAL | Status: AC
  Administered 2024-05-18: 30 mL via ORAL
  Filled 2024-05-18: qty 30

## 2024-05-18 NOTE — ED Notes (Signed)
 Pt. Brought in by family due to her mouth is broke out in sores and she is not eating at all.  Pt. Is having chemo for abd. Cancer per her daughters.  Pt. Has had abd surgery for the cancer with intestine removal.

## 2024-05-18 NOTE — ED Notes (Signed)
 Pt. Has a Port on the L upper chest and was last used on the 24th of dec.  Pt. Asked Charge RN Grayce to start and IV in the arm today not to use the [place].

## 2024-05-18 NOTE — ED Notes (Addendum)
 Daughter reports weak and lethargic x 1 week. Worse over the past 2 days. Poor po intake . Slow to answer and difficult to understand  Last chemo 05/09/24 Pt able to follow commands

## 2024-05-18 NOTE — ED Notes (Signed)
Lab aware of add on labs

## 2024-05-18 NOTE — Plan of Care (Addendum)
 [facility] to [facility] -stepdown unit transfer:  54 year old female past medical history of gastric adenocarcinoma on chemotherapy, chemotherapy-induced anemia and thrombocytopenia, essential hypertension, hyperlipidemia, DM type II, peripheral neuropathy, chronic nausea and GERD presented emergency department for altered mental status and lethargy.Family states she has had progressive weakness however worse over the last 5 days.  Very little p.o. intake.  She has noted mouth sores they are trying to do nausea medication as well as medication for her oral pain without improvement.  Family states she has had a white scrappable coating on her tongue as well.  She has noted worsening pain to her right lower extremity.  Typically gets pain after her chemotherapy takes Claritin for this however pain feels different and worse than her normal.  Her last chemo was 12/22--received Neupogen.  Feels very weak.  Chills at home wo documented fever. Typically uses a walker, yesterday able to walk a few steps.  Syncopal episode 2 days ago family thought likely due to dehydration.  They are trying to give her Pedialyte at home.  Unable to stand or sit up on her own today due to generalized weakness.  Last bowel movement 2 days ago.  No blood.  Some mild blood-tinged sputum and oral secretions.  Urinating without difficulty.  At presentation to ED patient found tachycardic heart rate up to 117 otherwise hemodynamically stable.  Afebrile.  Lab, CBC showing low WBC count 6, low platelet count 22, stable H&H, absolute neutrophil count 0. UA no evidence of UTI.  Blood cultures are in process. CMP showing elevated sodium 147, low bicarb 19, elevated blood glucose 192, elevated BUN 32, elevated bilirubin 1.5, elevated and gap 20. Pending serum osmolarity and beta-hydroxybutyrate level. Normal ammonia level. Elevated lactic acid level 2.5.  Chest x-ray showing no acute disease process. CT head no acute  abnormality.  CTA chest no evidence of PE.  CT abdomen pelvis: 1. Limited evaluation of the gastric lumen due to underdistension, with postoperative gastric changes. Possible underlying gastric wall hypodense thickening. 2. Distal esophageal wall thickening versus a hiatal hernia. 3. No evidence of metastatic disease.  Right lower extremity no evidence of DVT.  In the ED code sepsis has been activated.  Patient received 1 L of NS bolus currently maintenance fluid LR 150 cc/h. Also received cefepime and vancomycin and filgrastim.   EDP discussed with oncology Dr. Gwendlyn who will see in consult.  Agreeable with antibiotics at this time.   Hospitalist consulted for further evaluation management of neutropenic fever, thrombocytopenia and sepsis of unknown origin. Giving 1 unit of platelet.    [facility] will assume care on arrival to accepting facility. Until arrival, care as per EDP. However, [facility] available 24/7 for questions and assistance. Check [email or web address] for on-call coverage. Nursing staff, please call [facility] Admits & Consults System-Wide number under Amion on patient's arrival so appropriate admitting provider can evaluate the pt.   Author: Shauna Bodkins, MD  [facility]

## 2024-05-18 NOTE — Sepsis Progress Note (Signed)
 eLink is following this Code Sepsis.

## 2024-05-18 NOTE — ED Notes (Signed)
 Reported to [facility] That Pt. Serum Osmo is 328

## 2024-05-18 NOTE — Telephone Encounter (Signed)
 Spoke with pt daughter in regards to her FMLA forms being completed,faxed, and confirmation received. Pt daughter ( [name]) request for her form to be emailed.

## 2024-05-18 NOTE — ED Provider Notes (Signed)
 " [facility] Provider Note   CSN: [id number] Arrival date & time: 05/18/24  1407     Patient presents with: Altered Mental Status   Susan Whitaker is a 54 y.o. female here for altered mental status, lethargy.  Family states she has had progressive weakness however worse over the last 5 days.  Very little p.o. intake.  She has noted mouth sores they are trying to do nausea medication as well as medication for her oral pain without improvement.  Family states she has had a white scrappable coating on her tongue as well.  She has noted worsening pain to her right lower extremity.  Typically gets pain after her chemotherapy takes Claritin for this however pain feels different and worse than her normal.  Her last chemo was 12/22--received Neupogen.  Feels very weak.  Chills at home wo documented fever. Typically uses a walker, yesterday able to walk a few steps.  Syncopal episode 2 days ago family thought likely due to dehydration.  They are trying to give her Pedialyte at home.  Unable to stand or sit up on her own today due to generalized weakness.  Last bowel movement 2 days ago.  No blood.  Some mild blood-tinged sputum and oral secretions.  Urinating without difficulty  Oncology Dr. Lanny Matte adenocarcinoma    HPI     Prior to Admission medications  Medication Sig Start Date End Date Taking? Authorizing Provider  acetaminophen  (TYLENOL ) 325 MG tablet Take 2 tablets (650 mg total) by mouth every 4 (four) hours as needed for headache or mild pain. 10/04/20   Marylu Leita SAUNDERS, NP  acetaminophen  (TYLENOL ) 500 MG tablet Take 2 tablets (1,000 mg total) by mouth every 8 (eight) hours. 01/19/24   Aron Shoulders, MD  amLODipine  (NORVASC ) 5 MG tablet Take 1 tablet (5 mg total) by mouth daily. 03/19/24   Nche, Roselie Rockford, NP  atorvastatin  (LIPITOR) 80 MG tablet Take 1 tablet (80 mg total) by mouth daily. 09/21/23   Thapa, Sudan, MD  blood glucose meter  kit and supplies Use in the morning, afternoon, and at bedtime. 05/20/23   Thapa, Sudan, MD  Blood Glucose Monitoring Suppl (BLOOD GLUCOSE MONITOR SYSTEM) w/Device KIT Use to check blood sugar 3 times daily. 01/23/24   Thapa, Sudan, MD  dexamethasone  (DECADRON ) 4 MG tablet Take 2 tablets (8 mg total) by mouth daily. Start the day after chemotherapy for 2 days. Take with food. 03/30/24   Lanny Callander, MD  diphenhydrAMINE -nystatin -alum & mag hydroxide-simeth-lidocaine  Take 5 mLs by mouth 4 (four) times daily as needed. 05/15/24   Boscia, Heather E, NP  feeding supplement (ENSURE PLUS HIGH PROTEIN) LIQD Take 237 mLs by mouth 2 (two) times daily between meals. 01/19/24   Aron Shoulders, MD  gabapentin  (NEURONTIN ) 100 MG capsule Take 2 capsules (200 mg total) by mouth 3 (three) times daily. 01/19/24   Aron Shoulders, MD  glucose blood (ACCU-CHEK GUIDE) test strip Use as instructed to check once daily. 10/20/21   Von Pacific, MD  Glucose Blood (BLOOD GLUCOSE TEST STRIPS) STRP Use 1 each daily to test blood glucose levels. 05/20/23 05/19/24  Thapa, Sudan, MD  Glucose Blood (BLOOD GLUCOSE TEST STRIPS) STRP Use 1 each at morning, noon, and bedtime to check blood glucose. 01/23/24 06/04/24  Thapa, Sudan, MD  levonorgestrel  (MIRENA , 52 MG,) 20 MCG/DAY IUD Mirena  20 mcg/24 hours (7 yrs) 52 mg intrauterine device  Take by intrauterine route.    [provider]  lidocaine -prilocaine  (EMLA ) cream Apply to affected area once. 03/30/24   Lanny Callander, MD  methocarbamol  (ROBAXIN ) 500 MG tablet Take 1 tablet (500 mg total) by mouth every 6 (six) hours as needed for muscle spasms. 01/19/24   Aron Shoulders, MD  ondansetron  (ZOFRAN ) 8 MG tablet Take 1 tablet (8 mg total) by mouth every 8 (eight) hours as needed for nausea or vomiting. Start on the third day after chemotherapy. 03/30/24   Lanny Callander, MD  ondansetron  (ZOFRAN -ODT) 4 MG disintegrating tablet Dissolve 1 tablet (4 mg total) by mouth every 6 (six) hours as needed for nausea. 01/19/24    Aron Shoulders, MD  [facility] DELICA LANCETS FINE MISC Use to check blood sugar 3 times daily 09/28/16   Von [place], MD  oxyCODONE  (OXY IR/ROXICODONE ) 5 MG immediate release tablet Take 1-2 tablets (5-10 mg total) by mouth every 4 (four) hours as needed for moderate pain (pain score 4-6). 02/15/24   Aron Shoulders, MD  pantoprazole  (PROTONIX ) 40 MG tablet Take 1 tablet (40 mg total) by mouth daily. 08/16/23   Charlanne Groom, MD  polyethylene glycol (MIRALAX  / GLYCOLAX ) 17 g packet Take 17 g by mouth daily.    [provider]  potassium chloride  SA (KLOR-CON  M) 20 MEQ tablet Take 2 tablets (40 mEq total) by mouth 2 (two) times daily. Correction to previous prescription 02/23/24   Lanny Callander, MD  Probiotic Product [facility] IMMUNE HEALTH PO) Take 1 capsule by mouth daily.    [provider]  prochlorperazine  (COMPAZINE ) 10 MG tablet Take 1 tablet (10 mg total) by mouth every 6 (six) hours as needed for nausea or vomiting. 03/30/24   Lanny Callander, MD    Allergies: Lisinopril    Review of Systems  Constitutional:  Positive for activity change, appetite change and fatigue.  HENT: Negative.    Respiratory:  Positive for cough.   Gastrointestinal:  Positive for abdominal pain, nausea and vomiting.  Genitourinary: Negative.   Musculoskeletal:  Negative for neck pain and neck stiffness.       Right leg pain  Skin: Negative.   Neurological:  Positive for syncope, weakness and light-headedness. Negative for tremors, facial asymmetry, speech difficulty, numbness and headaches.  All other systems reviewed and are negative.   Updated Vital Signs BP (!) 140/67 (BP Location: Right Arm)   Pulse (!) 101   Temp 99.7 F (37.6 C) (Oral)   Resp 18   Wt 58.8 kg   LMP  (LMP Unknown)   SpO2 100%   BMI 22.98 kg/m   Physical Exam Vitals and nursing note reviewed.  Constitutional:      General: She is not in acute distress.    Appearance: She is well-developed. She is ill-appearing and  toxic-appearing. She is not diaphoretic.  HENT:     Head: Normocephalic and atraumatic.     Nose: Nose normal.     Mouth/Throat:     Mouth: Mucous membranes are dry.     Comments: Dry mucous membranes, white scrappable coating on tongue.  Ulcerative lesions, dried blood mucous membranes Eyes:     Pupils: Pupils are equal, round, and reactive to light.  Neck:     Comments: Full range of motion without difficulty Cardiovascular:     Rate and Rhythm: Regular rhythm. Tachycardia present.     Heart sounds: Normal heart sounds.  Pulmonary:     Effort: Pulmonary effort is normal. No respiratory distress.     Breath sounds: Normal breath sounds.  Abdominal:  General: Bowel sounds are normal. There is no distension.     Palpations: Abdomen is soft.     Tenderness: There is abdominal tenderness. There is no right CVA tenderness, left CVA tenderness, guarding or rebound.     Comments: Mild diffuse upper abdominal tenderness, no rebound or guarding  Musculoskeletal:        General: Normal range of motion.     Cervical back: Normal range of motion and neck supple.     Comments: Spontaneously moves extremities however slow with all movement  Skin:    General: Skin is warm and dry.  Neurological:     Mental Status: She is alert.     Motor: Weakness present.     Comments: Global weakness bilateral upper and lower extremities, follows commands.  Unable to ambulate.  Unable to sit unassisted in bed due to weakness.  No nystagmus.  Smile symmetric     (all labs ordered are listed, but only abnormal results are displayed) Labs Reviewed  COMPREHENSIVE METABOLIC PANEL WITH GFR - Abnormal; Notable for the following components:      Result Value   Sodium 147 (*)    CO2 19 (*)    Glucose, Bld 192 (*)    BUN 32 (*)    Total Protein 8.5 (*)    AST 14 (*)    Total Bilirubin 1.5 (*)    Anion gap 20 (*)    All other components within normal limits  LACTIC ACID, PLASMA - Abnormal; Notable for the  following components:   Lactic Acid, Venous 2.5 (*)    All other components within normal limits  URINALYSIS, W/ REFLEX TO CULTURE (INFECTION SUSPECTED) - Abnormal; Notable for the following components:   APPearance HAZY (*)    Glucose, UA 100 (*)    Hgb urine dipstick LARGE (*)    Bilirubin Urine SMALL (*)    Ketones, ur 40 (*)    Protein, ur >=300 (*)    Bacteria, UA FEW (*)    All other components within normal limits  CBC WITH DIFFERENTIAL/PLATELET - Abnormal; Notable for the following components:   WBC 0.6 (*)    RBC 3.39 (*)    Hemoglobin 10.2 (*)    HCT 30.3 (*)    RDW 18.9 (*)    Platelets 22 (*)    Neutro Abs 0.0 (*)    Lymphs Abs 0.6 (*)    Monocytes Absolute 0.0 (*)    All other components within normal limits  CBG MONITORING, ED - Abnormal; Notable for the following components:   Glucose-Capillary 193 (*)    All other components within normal limits  RESP PANEL BY RT-PCR (RSV, FLU A&B, COVID)  RVPGX2  CULTURE, BLOOD (ROUTINE X 2)  CULTURE, BLOOD (ROUTINE X 2)  URINE CULTURE  LIPASE, BLOOD  AMMONIA  CBC WITH DIFFERENTIAL/PLATELET  LACTIC ACID, PLASMA  BETA-HYDROXYBUTYRIC ACID  OSMOLALITY  LACTIC ACID, PLASMA  LACTIC ACID, PLASMA  TYPE AND SCREEN  ABO/RH  PREPARE PLATELET PHERESIS    EKG: None  Radiology: US  Venous Img Lower Unilateral Right Result Date: 05/18/2024 EXAM: ULTRASOUND DUPLEX OF THE RIGHT LOWER EXTREMITY VEINS TECHNIQUE: Duplex ultrasound using B-mode/gray scaled imaging and Doppler spectral analysis and color flow was obtained of the deep venous structures of the right lower extremity. COMPARISON: None available. CLINICAL HISTORY: Pain, hx of CA FINDINGS: The common femoral vein, femoral vein, popliteal vein, and posterior tibial vein demonstrate normal compressibility with normal color flow and spectral analysis. IMPRESSION: 1.  No evidence of DVT. Electronically signed by: Elsie Gravely MD 05/18/2024 06:15 PM EST RP Workstation: [id number]    CT ABDOMEN PELVIS W CONTRAST Result Date: 05/18/2024 EXAM: CT ABDOMEN AND PELVIS WITH CONTRAST 05/18/2024 04:47:23 PM TECHNIQUE: CT of the abdomen and pelvis was performed with the administration of 80 mL of iohexol  (OMNIPAQUE ) 350 MG/ML injection. Multiplanar reformatted images are provided for review. Automated exposure control, iterative reconstruction, and/or weight-based adjustment of the mA/kV was utilized to reduce the radiation dose to as low as reasonably achievable. COMPARISON: CT enterography 11/11/2023, CT chest 11/29/2023. CLINICAL HISTORY: gastric ca, ams, emesis. FINDINGS: LOWER CHEST: Suggestion of distal esophageal wall thickening versus hiatal hernia. LIVER: Diffusely hypodense hepatic parenchyma compared to the spleen. GALLBLADDER AND BILE DUCTS: Gallbladder is unremarkable. No biliary ductal dilatation. SPLEEN: No acute abnormality. PANCREAS: No acute abnormality. ADRENAL GLANDS: No acute abnormality. KIDNEYS, URETERS AND BLADDER: No stones in the kidneys or ureters. No hydronephrosis. No perinephric or periureteral stranding. Urinary bladder is unremarkable. GI AND BOWEL: Gastric surgical changes noted. Limited evaluation of the gastric lumen due to underdistension. Possible underlying gastric wall hypodense thickening. No small or large bowel thickening or dilatation. The appendix is not definitely identified with no inflammatory changes in the right lower quadrant to suggest acute appendicitis. PERITONEUM AND RETROPERITONEUM: No ascites. No free air. VASCULATURE: At least moderate atherosclerotic plaque. Aorta is normal in caliber. LYMPH NODES: No lymphadenopathy. REPRODUCTIVE ORGANS: Intramural calcified uterine fibroids. T-shaped intrauterine device in appropriate position. BONES AND SOFT TISSUES: No acute osseous abnormality. No focal soft tissue abnormality. IMPRESSION: 1. Limited evaluation of the gastric lumen due to underdistension, with postoperative gastric changes. Possible  underlying gastric wall hypodense thickening. 2. Distal esophageal wall thickening versus a hiatal hernia. 3. No evidence of metastatic disease. Electronically signed by: Morgane Naveau MD 05/18/2024 05:05 PM EST RP Workstation: [id number]   CT Head Wo Contrast Result Date: 05/18/2024 EXAM: CT HEAD WITHOUT CONTRAST 05/18/2024 04:47:23 PM TECHNIQUE: CT of the head was performed without the administration of intravenous contrast. Automated exposure control, iterative reconstruction, and/or weight based adjustment of the mA/kV was utilized to reduce the radiation dose to as low as reasonably achievable. COMPARISON: None available. CLINICAL HISTORY: Delirium FINDINGS: BRAIN AND VENTRICLES: No acute hemorrhage. No evidence of acute infarct. No hydrocephalus. No extra-axial collection. No mass effect or midline shift. ORBITS: No acute abnormality. SINUSES: No acute abnormality. SOFT TISSUES AND SKULL: No acute soft tissue abnormality. No skull fracture. IMPRESSION: 1. No acute intracranial abnormality. Electronically signed by: Morgane Naveau MD 05/18/2024 04:57 PM EST RP Workstation: [id number]   CT Angio Chest PE W and/or Wo Contrast Result Date: 05/18/2024 EXAM: CTA CHEST 05/18/2024 04:47:23 PM TECHNIQUE: CTA of the chest was performed after the administration of 80 mL of iohexol  (OMNIPAQUE ) 350 MG/ML injection. Multiplanar reformatted images are provided for review. MIP images are provided for review. Automated exposure control, iterative reconstruction, and/or weight based adjustment of the mA/kV was utilized to reduce the radiation dose to as low as reasonably achievable. COMPARISON: CT enterography 11/11/2023, CT chest 11/29/2023. CLINICAL HISTORY: Pulmonary embolism (PE) suspected, high prob; SOB, CA, AMS. FINDINGS: PULMONARY ARTERIES: Pulmonary arteries are adequately opacified for evaluation. No acute pulmonary embolus. Main pulmonary artery is normal in caliber. MEDIASTINUM: The heart and pericardium  demonstrate no acute abnormality. There is no acute abnormality of the thoracic aorta. Left chest wall port-a-cath with tip at the superior vena cava-right atrial junction. LYMPH NODES: Right hilar lymphadenopathy measuring up to 1.4 cm. No mediastinal or  axillary lymphadenopathy. LUNGS AND PLEURA: The lungs are without acute process. No focal consolidation or pulmonary edema. No evidence of pleural effusion or pneumothorax. UPPER ABDOMEN: Please see separately dictated CT abdomen and pelvis 05/18/2024 SOFT TISSUES AND BONES: No acute bone or soft tissue abnormality. IMPRESSION: 1. No pulmonary embolism. 2. Right hilar lymphadenopathy measuring up to 1.4 cm. Indeterminate etiology. Recommended attention on follow-up. Electronically signed by: Morgane Naveau MD 05/18/2024 04:55 PM EST RP Workstation: [id number]   DG Chest Portable 1 View Result Date: 05/18/2024 EXAM: 1 VIEW(S) XRAY OF THE CHEST 05/18/2024 03:46:00 PM COMPARISON: CXR 02/15/2024, CT Chest 11/29/2023. CLINICAL HISTORY: ams Patient is receiving chemo for abd cancer per daughters. Hx abd surg and colon resection. Here today w/ chemo mouth- oral sores and blisters, decreased appetite, aloc. Non smoker. FINDINGS: LINES, TUBES AND DEVICES: Left chest Port-A-Cath in place with tip in distal SVC. LUNGS AND PLEURA: No focal pulmonary opacity. No pleural effusion. No pneumothorax. HEART AND MEDIASTINUM: No acute abnormality of the cardiac and mediastinal silhouettes. BONES AND SOFT TISSUES: No acute osseous abnormality. IMPRESSION: 1. No acute cardiopulmonary abnormality. 2. Left chest Port-A-Cath tip projects over the distal SVC. Electronically signed by: Morgane Naveau MD 05/18/2024 04:50 PM EST RP Workstation: [id number]     .Critical Care  Performed by: Edie Rosebud LABOR, PA-C Authorized by: Edie Rosebud LABOR, PA-C   Critical care provider statement:    Critical care time (minutes):  35   Critical care was necessary to treat or prevent  imminent or life-threatening deterioration of the following conditions:  Sepsis and dehydration   Critical care was time spent personally by me on the following activities:  Development of treatment plan with patient or surrogate, discussions with consultants, evaluation of patient's response to treatment, examination of patient, ordering and review of laboratory studies, ordering and review of radiographic studies, ordering and performing treatments and interventions, pulse oximetry, re-evaluation of patient's condition and review of old charts    Medications Ordered in the ED  lactated ringers  infusion ( Intravenous New Bag/Given 05/18/24 1723)  vancomycin (VANCOCIN) IVPB 1000 mg/200 mL premix (has no administration in time range)  filgrastim-aafi (NIVESTYM) injection 300 mcg (has no administration in time range)  0.9 %  sodium chloride  infusion (Manually program via Guardrails IV Fluids) (has no administration in time range)  insulin  aspart (novoLOG ) injection 0-6 Units (has no administration in time range)  insulin  aspart (novoLOG ) injection 0-5 Units (has no administration in time range)  acetaminophen  (TYLENOL ) tablet 650 mg (has no administration in time range)  sodium chloride  0.9 % bolus 1,000 mL (1,000 mLs Intravenous New Bag/Given 05/18/24 1531)  alum & mag hydroxide-simeth (MAALOX/MYLANTA) 200-200-20 MG/5ML suspension 30 mL (30 mLs Oral Given 05/18/24 1719)    And  lidocaine  (XYLOCAINE ) 2 % viscous mouth solution 15 mL (15 mLs Oral Given 05/18/24 1719)  iohexol  (OMNIPAQUE ) 350 MG/ML injection 80 mL (80 mLs Intravenous Contrast Given 05/18/24 1628)  ceFEPIme (MAXIPIME) 2 g in sodium chloride  0.9 % 100 mL IVPB (0 g Intravenous Stopped 05/18/24 1749)  metroNIDAZOLE  (FLAGYL ) IVPB 500 mg (0 mg Intravenous Stopped 05/18/24 5270)   54 year old known gastric adenocarcinoma undergoing chemotherapy here for evaluation of altered mental status.  Patient lethargic, weak over the last week however worse over  the last few days.  Syncopal episode at home 2 days ago.  Not tolerating much by mouth due to what appears to be mucositis as well as thrush.  Having some blood-tinged sputum and secretions.  Typically able to  ambulate with a walker however unable to stand over the last 24 hours.  Difficulty sitting upright in bed due to weakness.  Also with worsening to her right leg pain feels different than her typical pain after her injections.  Here she is tachycardic, appears very dry.  Tries to mouth answers to questions however weak voice.  She has no focal deficits however does appear globally weak.  No history of brain metastases.  Will plan on imaging of her head, will add on CTA chest rule out PE given her unilateral leg pain, tachycardia as well as CT abdomen pelvis given her upper abdominal pain.  Add on blood work, IV fluids.  Labs and imaging personally viewed and interpreted:  CBG 193 Metabolic panel sodium 147, T. bili 1.5, anion gap 20 COVID flu RSV negative Ammonia less than 15 CBC WBC 0.6, platelets 22, neutrophil 0 Lipase 14 UA negative for infection CT abdomen pelvis without acute abnormality  Discussed results with patient, family at bedside.  Will treat with broad-spectrum antibiotics given significant leukopenia, chills.  Discussed with oncology who will see in consult.  Agreeable with antibiotics at this time.  Discussed with Dr. Sundil with medicine who is agreeable for admission.  Oral temp 99.7--- treated with broad spectrum abd. Patient and family aware of admission.     Clinical Course as of 05/18/24 2039  Fri May 18, 2024  1610 Lactic acid, plasma(!!) Suspect likely due to dehydration [BH]  1644 Significant leukopenia, tachycardia, lactic acid broad-spectrum antibiotics given for sepsis. [BH]  1728 Discussed with Dr. Lonn with oncology.  Agrees with admission, antibiotics.  Will see in consult [BH]  1912 Dr. Sundil with medicine for admission [BH]  2039 Oral temp 103,  giving tylenol  supp [BH]    Clinical Course User Index [BH] Anina Schnake A, PA-C                                 Medical Decision Making Amount and/or Complexity of Data Reviewed Labs: ordered. Decision-making details documented in ED Course. Radiology: ordered.  Risk OTC drugs. Prescription drug management. Decision regarding hospitalization.        Final diagnoses:  SIRS (systemic inflammatory response syndrome) (HCC)  Chemotherapy-induced neutropenia  Mucositis  Dehydration  Thrombocytopenia  Hypernatremia  Neutropenic fever    ED Discharge Orders     None          Nahzir Pohle A, PA-C 05/18/24 1918    Dreama Longs, MD 05/18/24 2256  "

## 2024-05-18 NOTE — Progress Notes (Addendum)
 Pharmacy Antibiotic Note  Susan Whitaker is a 54 y.o. female admitted on 05/18/2024 with febrile neutropenia.  Pharmacy has been consulted for cefepime and vancomycin dosing.   Plan: Cefepime 2g q12h.  Give 1500mg  vancomycin x1 for load, then start vancomycin 1000mg  q24h for eAUC:482. Follow culture data for de-escalation.  Monitor renal function for dose adjustments as indicated.    Weight: 58.8 kg (129 lb 11.2 oz)  Temp (24hrs), Avg:98.8 F (37.1 C), Min:97.9 F (36.6 C), Max:99.7 F (37.6 C)  Recent Labs  Lab 05/18/24 1426 05/18/24 1444 05/18/24 1528  WBC  --   --  0.6*  CREATININE  --  0.98  --   LATICACIDVEN 2.5*  --   --     Estimated Creatinine Clearance: 53.7 mL/min (by C-G formula based on SCr of 0.98 mg/dL).    Allergies[1]  Antimicrobials this admission: Cefepime 1/2 >>  Vancomycin 1/2 >>   Microbiology results: 1/2 BCx:  1/2 UCx:    Thank you for allowing pharmacy to be a part of this patients care.  Powell Blush, PharmD, BCCCP  05/18/2024 7:16 PM     [1]  Allergies Allergen Reactions   Lisinopril Cough

## 2024-05-18 NOTE — ED Triage Notes (Signed)
 Chemo patient . Per daughter they were bathing patient when she lost consciousness , weakness all over , had to pull her from the car .  In triage Pt alert and oriented x 4 , slow speech . No obvious neuro deficit .  Expresses lethargy .

## 2024-05-19 DIAGNOSIS — R531 Weakness: Secondary | ICD-10-CM | POA: Diagnosis not present

## 2024-05-19 DIAGNOSIS — R5081 Fever presenting with conditions classified elsewhere: Secondary | ICD-10-CM | POA: Diagnosis present

## 2024-05-19 DIAGNOSIS — T451X5A Adverse effect of antineoplastic and immunosuppressive drugs, initial encounter: Secondary | ICD-10-CM | POA: Diagnosis present

## 2024-05-19 DIAGNOSIS — I959 Hypotension, unspecified: Secondary | ICD-10-CM | POA: Diagnosis not present

## 2024-05-19 DIAGNOSIS — Z975 Presence of (intrauterine) contraceptive device: Secondary | ICD-10-CM | POA: Diagnosis not present

## 2024-05-19 DIAGNOSIS — A419 Sepsis, unspecified organism: Secondary | ICD-10-CM | POA: Diagnosis present

## 2024-05-19 DIAGNOSIS — K219 Gastro-esophageal reflux disease without esophagitis: Secondary | ICD-10-CM | POA: Diagnosis present

## 2024-05-19 DIAGNOSIS — E43 Unspecified severe protein-calorie malnutrition: Secondary | ICD-10-CM | POA: Diagnosis present

## 2024-05-19 DIAGNOSIS — Z79899 Other long term (current) drug therapy: Secondary | ICD-10-CM | POA: Diagnosis not present

## 2024-05-19 DIAGNOSIS — D701 Agranulocytosis secondary to cancer chemotherapy: Secondary | ICD-10-CM | POA: Diagnosis present

## 2024-05-19 DIAGNOSIS — D709 Neutropenia, unspecified: Secondary | ICD-10-CM | POA: Diagnosis present

## 2024-05-19 DIAGNOSIS — E870 Hyperosmolality and hypernatremia: Secondary | ICD-10-CM | POA: Diagnosis present

## 2024-05-19 DIAGNOSIS — Z8249 Family history of ischemic heart disease and other diseases of the circulatory system: Secondary | ICD-10-CM | POA: Diagnosis not present

## 2024-05-19 DIAGNOSIS — K1230 Oral mucositis (ulcerative), unspecified: Secondary | ICD-10-CM | POA: Diagnosis present

## 2024-05-19 DIAGNOSIS — E119 Type 2 diabetes mellitus without complications: Secondary | ICD-10-CM | POA: Diagnosis present

## 2024-05-19 DIAGNOSIS — Z7401 Bed confinement status: Secondary | ICD-10-CM | POA: Diagnosis not present

## 2024-05-19 DIAGNOSIS — D696 Thrombocytopenia, unspecified: Secondary | ICD-10-CM | POA: Diagnosis present

## 2024-05-19 DIAGNOSIS — E871 Hypo-osmolality and hyponatremia: Secondary | ICD-10-CM | POA: Diagnosis present

## 2024-05-19 DIAGNOSIS — Z888 Allergy status to other drugs, medicaments and biological substances status: Secondary | ICD-10-CM | POA: Diagnosis not present

## 2024-05-19 DIAGNOSIS — K1231 Oral mucositis (ulcerative) due to antineoplastic therapy: Secondary | ICD-10-CM | POA: Diagnosis present

## 2024-05-19 DIAGNOSIS — E7800 Pure hypercholesterolemia, unspecified: Secondary | ICD-10-CM | POA: Diagnosis present

## 2024-05-19 DIAGNOSIS — E8721 Acute metabolic acidosis: Secondary | ICD-10-CM | POA: Diagnosis present

## 2024-05-19 DIAGNOSIS — R410 Disorientation, unspecified: Secondary | ICD-10-CM | POA: Diagnosis not present

## 2024-05-19 DIAGNOSIS — D61818 Other pancytopenia: Secondary | ICD-10-CM | POA: Diagnosis present

## 2024-05-19 DIAGNOSIS — R6510 Systemic inflammatory response syndrome (SIRS) of non-infectious origin without acute organ dysfunction: Secondary | ICD-10-CM | POA: Diagnosis present

## 2024-05-19 DIAGNOSIS — B379 Candidiasis, unspecified: Secondary | ICD-10-CM | POA: Diagnosis present

## 2024-05-19 DIAGNOSIS — E785 Hyperlipidemia, unspecified: Secondary | ICD-10-CM | POA: Diagnosis present

## 2024-05-19 DIAGNOSIS — E860 Dehydration: Secondary | ICD-10-CM | POA: Diagnosis present

## 2024-05-19 DIAGNOSIS — R6520 Severe sepsis without septic shock: Secondary | ICD-10-CM | POA: Diagnosis present

## 2024-05-19 DIAGNOSIS — R4182 Altered mental status, unspecified: Secondary | ICD-10-CM | POA: Diagnosis present

## 2024-05-19 DIAGNOSIS — E876 Hypokalemia: Secondary | ICD-10-CM | POA: Diagnosis present

## 2024-05-19 DIAGNOSIS — D61810 Antineoplastic chemotherapy induced pancytopenia: Secondary | ICD-10-CM | POA: Diagnosis present

## 2024-05-19 DIAGNOSIS — I10 Essential (primary) hypertension: Secondary | ICD-10-CM | POA: Diagnosis present

## 2024-05-19 DIAGNOSIS — C169 Malignant neoplasm of stomach, unspecified: Secondary | ICD-10-CM | POA: Diagnosis present

## 2024-05-19 DIAGNOSIS — Z1152 Encounter for screening for COVID-19: Secondary | ICD-10-CM | POA: Diagnosis not present

## 2024-05-19 LAB — CBG MONITORING, ED: Glucose-Capillary: 146 mg/dL — ABNORMAL HIGH (ref 70–99)

## 2024-05-19 LAB — COMPREHENSIVE METABOLIC PANEL WITH GFR
ALT: 19 U/L (ref 0–44)
AST: 25 U/L (ref 15–41)
Albumin: 3.4 g/dL — ABNORMAL LOW (ref 3.5–5.0)
Alkaline Phosphatase: 60 U/L (ref 38–126)
Anion gap: 11 (ref 5–15)
BUN: 21 mg/dL — ABNORMAL HIGH (ref 6–20)
CO2: 23 mmol/L (ref 22–32)
Calcium: 8.4 mg/dL — ABNORMAL LOW (ref 8.9–10.3)
Chloride: 116 mmol/L — ABNORMAL HIGH (ref 98–111)
Creatinine, Ser: 0.81 mg/dL (ref 0.44–1.00)
GFR, Estimated: >60 mL/min
Glucose, Bld: 156 mg/dL — ABNORMAL HIGH (ref 70–99)
Potassium: 2.8 mmol/L — ABNORMAL LOW (ref 3.5–5.1)
Sodium: 149 mmol/L — ABNORMAL HIGH (ref 135–145)
Total Bilirubin: 1.1 mg/dL (ref 0.0–1.2)
Total Protein: 6.9 g/dL (ref 6.5–8.1)

## 2024-05-19 LAB — LACTIC ACID, PLASMA
Lactic Acid, Venous: 2.4 mmol/L (ref 0.5–1.9)
Lactic Acid, Venous: 2.3 mmol/L (ref 0.5–1.9)

## 2024-05-19 LAB — CBC
HCT: 24.9 % — ABNORMAL LOW (ref 36.0–46.0)
Hemoglobin: 8.2 g/dL — ABNORMAL LOW (ref 12.0–15.0)
MCH: 30.1 pg (ref 26.0–34.0)
MCHC: 32.9 g/dL (ref 30.0–36.0)
MCV: 91.5 fL (ref 80.0–100.0)
Platelets: 44 K/uL — ABNORMAL LOW (ref 150–400)
RBC: 2.72 MIL/uL — ABNORMAL LOW (ref 3.87–5.11)
RDW: 19.3 % — ABNORMAL HIGH (ref 11.5–15.5)
WBC: 0.7 K/uL — CL (ref 4.0–10.5)
nRBC: 0 % (ref 0.0–0.2)

## 2024-05-19 LAB — BASIC METABOLIC PANEL WITH GFR
Anion gap: 12 (ref 5–15)
BUN: 18 mg/dL (ref 6–20)
CO2: 23 mmol/L (ref 22–32)
Calcium: 8.6 mg/dL — ABNORMAL LOW (ref 8.9–10.3)
Chloride: 115 mmol/L — ABNORMAL HIGH (ref 98–111)
Creatinine, Ser: 0.74 mg/dL (ref 0.44–1.00)
GFR, Estimated: >60 mL/min
Glucose, Bld: 121 mg/dL — ABNORMAL HIGH (ref 70–99)
Potassium: 2.9 mmol/L — ABNORMAL LOW (ref 3.5–5.1)
Sodium: 150 mmol/L — ABNORMAL HIGH (ref 135–145)

## 2024-05-19 LAB — GLUCOSE, CAPILLARY
Glucose-Capillary: 131 mg/dL — ABNORMAL HIGH (ref 70–99)
Glucose-Capillary: 135 mg/dL — ABNORMAL HIGH (ref 70–99)
Glucose-Capillary: 142 mg/dL — ABNORMAL HIGH (ref 70–99)

## 2024-05-19 LAB — MAGNESIUM: Magnesium: 2.3 mg/dL (ref 1.7–2.4)

## 2024-05-19 MED ORDER — METRONIDAZOLE 500 MG/100ML IV SOLN
500.0000 mg | Freq: Two times a day (BID) | INTRAVENOUS | Status: DC
  Administered 2024-05-19 – 2024-05-21 (×5): 500 mg via INTRAVENOUS
  Filled 2024-05-19 (×5): qty 100

## 2024-05-19 MED ORDER — POTASSIUM CHLORIDE CRYS ER 20 MEQ PO TBCR
30.0000 meq | EXTENDED_RELEASE_TABLET | ORAL | Status: DC
  Filled 2024-05-19: qty 1

## 2024-05-19 MED ORDER — SODIUM CHLORIDE 0.9 % IV SOLN
2.0000 g | Freq: Three times a day (TID) | INTRAVENOUS | Status: AC
  Administered 2024-05-19 – 2024-05-25 (×18): 2 g via INTRAVENOUS
  Filled 2024-05-19 (×17): qty 12.5

## 2024-05-19 MED ORDER — ONDANSETRON HCL 4 MG PO TABS: 4.0000 mg | ORAL_TABLET | Freq: Four times a day (QID) | ORAL | Status: DC | PRN

## 2024-05-19 MED ORDER — LACTATED RINGERS IV BOLUS
1000.0000 mL | Freq: Once | INTRAVENOUS | Status: AC
  Administered 2024-05-19: 1000 mL via INTRAVENOUS

## 2024-05-19 MED ORDER — MAGIC MOUTHWASH W/LIDOCAINE
5.0000 mL | Freq: Four times a day (QID) | ORAL | Status: DC
  Administered 2024-05-20 – 2024-05-24 (×8): 5 mL via ORAL
  Filled 2024-05-19 (×38): qty 5

## 2024-05-19 MED ORDER — AMLODIPINE BESYLATE 5 MG PO TABS
5.0000 mg | ORAL_TABLET | Freq: Every day | ORAL | Status: DC
  Administered 2024-05-19 – 2024-05-28 (×8): 5 mg via ORAL
  Filled 2024-05-19 (×10): qty 1

## 2024-05-19 MED ORDER — SODIUM CHLORIDE 0.9% FLUSH
10.0000 mL | Freq: Two times a day (BID) | INTRAVENOUS | Status: AC
  Administered 2024-05-19 – 2024-05-27 (×7): 10 mL
  Administered 2024-05-27: 20 mL

## 2024-05-19 MED ORDER — ONDANSETRON HCL 4 MG/2ML IJ SOLN
4.0000 mg | Freq: Four times a day (QID) | INTRAMUSCULAR | Status: DC | PRN
  Administered 2024-05-22 – 2024-05-23 (×2): 4 mg via INTRAVENOUS
  Filled 2024-05-19 (×2): qty 2

## 2024-05-19 MED ORDER — BIOTENE DRY MOUTH MT LIQD: 15.0000 mL | OROMUCOSAL | Status: DC | PRN

## 2024-05-19 MED ORDER — ALBUTEROL SULFATE (2.5 MG/3ML) 0.083% IN NEBU: 2.5000 mg | INHALATION_SOLUTION | RESPIRATORY_TRACT | Status: DC | PRN

## 2024-05-19 MED ORDER — HYDROMORPHONE HCL 1 MG/ML IJ SOLN: 1.0000 mg | INTRAMUSCULAR | Status: DC | PRN

## 2024-05-19 MED ORDER — POTASSIUM CHLORIDE 10 MEQ/50ML IV SOLN
10.0000 meq | INTRAVENOUS | Status: AC
  Administered 2024-05-19 (×6): 10 meq via INTRAVENOUS
  Filled 2024-05-19 (×6): qty 50

## 2024-05-19 MED ORDER — ATORVASTATIN CALCIUM 40 MG PO TABS
80.0000 mg | ORAL_TABLET | Freq: Every day | ORAL | Status: DC
  Administered 2024-05-19 – 2024-05-20 (×2): 80 mg via ORAL
  Filled 2024-05-19 (×2): qty 2

## 2024-05-19 MED ORDER — CHLORHEXIDINE GLUCONATE CLOTH 2 % EX PADS
6.0000 | MEDICATED_PAD | Freq: Every day | CUTANEOUS | Status: AC
  Administered 2024-05-19 – 2024-05-28 (×10): 6 via TOPICAL

## 2024-05-19 MED ORDER — KETOROLAC TROMETHAMINE 15 MG/ML IJ SOLN
15.0000 mg | Freq: Once | INTRAMUSCULAR | Status: AC
  Administered 2024-05-19: 15 mg via INTRAVENOUS
  Filled 2024-05-19: qty 1

## 2024-05-19 MED ORDER — HYDROMORPHONE HCL 1 MG/ML IJ SOLN
0.5000 mg | INTRAMUSCULAR | Status: DC | PRN
  Filled 2024-05-19: qty 0.5

## 2024-05-19 MED ORDER — TRAZODONE HCL 50 MG PO TABS
25.0000 mg | ORAL_TABLET | Freq: Every evening | ORAL | Status: DC | PRN
  Filled 2024-05-19: qty 1

## 2024-05-19 MED ORDER — SODIUM CHLORIDE 0.45 % IV SOLN: INTRAVENOUS | Status: DC

## 2024-05-19 NOTE — Progress Notes (Signed)
" °   05/19/24 1902  Assess: MEWS Score  Temp (!) 102.3 F (39.1 C)  BP 130/71  MAP (mmHg) 80  Pulse Rate 95  ECG Heart Rate 94  Resp 15  SpO2 98 %  O2 Device Room Air  Assess: MEWS Score  MEWS Temp 2  MEWS Systolic 0  MEWS Pulse 0  MEWS RR 0  MEWS LOC 0  MEWS Score 2  MEWS Score Color Yellow  Assess: if the MEWS score is Yellow or Red  Were vital signs accurate and taken at a resting state? Yes  Does the patient meet 2 or more of the SIRS criteria? Yes  Does the patient have a confirmed or suspected source of infection? Yes  MEWS guidelines implemented  Yes, yellow  Treat  MEWS Interventions Considered administering scheduled or prn medications/treatments as ordered  Take Vital Signs  Increase Vital Sign Frequency  Yellow: Q2hr x1, continue Q4hrs until patient remains green for 12hrs  Escalate  MEWS: Escalate Yellow: Discuss with charge nurse and consider notifying provider and/or RRT  Notify: Charge Nurse/RN  Name of Charge Nurse/RN Notified Philippe, RN  Provider Notification  Provider Name/Title Blondie, NP  Date Provider Notified 05/19/24  Time Provider Notified 1945  Method of Notification Page  Notification Reason Change in status  Notify: Rapid Response  Name of Rapid Response RN Notified Genelle, RN  Date Rapid Response Notified 05/19/24  Time Rapid Response Notified 1945  Assess: SIRS CRITERIA  SIRS Temperature  1  SIRS Respirations  0  SIRS Pulse 1  SIRS WBC 1  SIRS Score Sum  3   Yellow MEWs protocol implemented for fever. See MAR for medication intervention. Room cooled. PT exam unchanged. "

## 2024-05-19 NOTE — ED Provider Notes (Signed)
 Clinical Course as of 05/19/24 0125  [place] May 18, 2024  1610 Lactic acid, plasma(!!) Suspect likely due to dehydration [BH]  1644 Significant leukopenia, tachycardia, lactic acid broad-spectrum antibiotics given for sepsis. [BH]  1728 Discussed with Dr. Lonn with oncology.  Agrees with admission, antibiotics.  Will see in consult [BH]  1912 Dr. Sundil with medicine for admission [BH]  2039 Oral temp 103, giving tylenol  supp [BH]    Clinical Course User Index [BH] Henderly, Britni A, PA-C    Reassessment: I was asked to come evaluate patient. Diagnosed as sepsis 2/2 neutropenia 2/2 chemo.  Recurrent fever 102. Hospitalists has requested platelet infusion which is not available at this facility.  Will treat fever with NSAID x1.  ABX infused.  Will arrange transfer to [facility] for platelets in the ED while boarding for a bed IP.  CRITICAL CARE Performed by: Cyndee Dada   Total critical care time: 45 minutes  Critical care time was exclusive of separately billable procedures and treating other patients.  Critical care was necessary to treat or prevent imminent or life-threatening deterioration.  Critical care was time spent personally by me on the following activities: development of treatment plan with patient and/or surrogate as well as nursing, discussions with consultants, evaluation of patient's response to treatment, examination of patient, obtaining history from patient or surrogate, ordering and performing treatments and interventions, ordering and review of laboratory studies, ordering and review of radiographic studies, pulse oximetry and re-evaluation of patient's condition.      Dada Cyndee, MD 05/19/24 0127

## 2024-05-19 NOTE — Progress Notes (Signed)
 Susan Whitaker   DOB:[birth year]   FM#:[id number]    ASSESSMENT & PLAN:  Stomach cancer She had received chemotherapy recently and her treatment was complicated by fever and mucositis in the outpatient Continue supportive care  Acquired pancytopenia Neutropenic fever Her labs this morning shows slight improvement of her white count Continue IV antibiotics and G-CSF support until ANC is greater than 1.5 She does not need blood or platelet transfusion, continue to monitor closely  Dehydration, high lactic acid, hyponatremia This is all consistent with dehydration Continue IV fluid support  Hypokalemia Due to poor oral intake and side effects of antibiotics Continue potassium replacement therapy as directed by primary service  Severe mucositis pain Will prescribe Magic mouthwash swish and spit along with Dilaudid  IV as needed I warned the patient and family risk of excessive sedation with IV pain medicine, advising them to use it judiciously  Discharge planning She would likely be here over the weekend  [name], MD 05/19/2024 12:30 PM  Subjective:  The patient is in the bed surrounded by her daughters.  She is complaining of severe mucositis pain, rated her pain at 7 out of 10.  Denies nausea, vomiting or diarrhea.  She was febrile this morning.  Cultures are still pending.   She was admitted from the emergency department after presentation with profound weakness, loss of consciousness and severe pancytopenia with fever. I reviewed her labs, vitals and culture results  Objective:  Vitals:   05/19/24 0800 05/19/24 1008  BP: 135/70 137/77  Pulse: 78 97  Resp: 16 18  Temp:  (!) 102.7 F (39.3 C)  SpO2: 100% 100%     Intake/Output Summary (Last 24 hours) at 05/19/2024 1230 Last data filed at 05/19/2024 0810 Gross per 24 hour  Intake 2875.72 ml  Output --  Net 2875.72 ml

## 2024-05-19 NOTE — H&P (Addendum)
 " History and Physical  Susan Whitaker FMW:[id number] DOB: August 19, 1970 DOA: 05/18/2024  PCP: Katheen Roselie Rockford, NP   Chief Complaint: Weakness  HPI: Susan Whitaker is a 54 y.o. female with medical history significant for well-controlled type 2 diabetes, GERD, hypertension, hyperlipidemia, gastric adenocarcinoma on neoadjuvant chemotherapy being admitted to the hospital with neutropenic fever.  History is provided mainly by the patient's 2 daughters were at the bedside, as well as discussion with the patient.  For the last several days the patient has been very weak and lethargic, she had a syncopal episode earlier this week when she was getting out of the shower.  She also has mucositis and it has been having very poor oral intake and some mild nausea.  At home, she did not have any abdominal pain, bleeding, vomiting or fevers.  Her daughters brought her to the emergency department for evaluation thinking that she just needed some fluids, but she was found to be neutropenic and have fever.  On further discussion with me this morning, the patient does admit to some dysuria that started a couple of days ago.  Review of Systems: Please see HPI for pertinent positives and negatives. A complete 10 system review of systems are otherwise negative.  Past Medical History:  Diagnosis Date   Abdominal bloating 09/14/2020   Abdominal pain 10/04/2020   Allergy     Cancer (HCC) 2025   Gastric Cancer   Class 2 obesity due to excess calories with body mass index (BMI) of 35.0 to 35.9 in adult 06/2010   DM type 2 (diabetes mellitus, type 2) (HCC)    Dysrhythmia    SVT s/p ablation   Gastric ulcer    GERD (gastroesophageal reflux disease)    H/O constipation    H/O hemorrhoids    History of small bowel obstruction 06/02/2019   Hyperlipidemia    Hypertension    Hypokalemia 2019   Menorrhagia    Microalbuminuria 02/15/2012   Primary hyperaldosteronism 02/21/2012   S/P radiofrequency ablation operation for  arrhythmia 10/03/20 10/04/2020   SVT (supraventricular tachycardia)    Noted 11/2011 admission   SVT (supraventricular tachycardia) 12/29/2011   Thrombocytopenia    Type 2 diabetes mellitus with obesity 11/17/2020   IMO SNOMED Dx Update Oct 2024     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   Past Surgical History:  Procedure Laterality Date   ABDOMINAL ADHESION SURGERY  06/03/2019   Dr Curvin   ABDOMINAL SURGERY     24 months of age-- unsure of type of surgery   COLONOSCOPY  04/2016   hemorrhoids--normal per pt with Dr Kristie   DILATATION & CURETTAGE/HYSTEROSCOPY WITH MYOSURE N/A 02/08/2020   Procedure: DILATATION & CURETTAGE/HYSTEROSCOPY WITH MYOSURE;  Surgeon: Gloriann Chick, MD;  Location: [facility];  Service: Gynecology;  Laterality: N/A;   ESOPHAGOGASTRODUODENOSCOPY N/A 12/26/2023   Procedure: EGD (ESOPHAGOGASTRODUODENOSCOPY);  Surgeon: Wilhelmenia Aloha Raddle., MD;  Location: [facility];  Service: Gastroenterology;  Laterality: N/A;   EUS N/A 12/26/2023   Procedure: ULTRASOUND, UPPER GI TRACT, ENDOSCOPIC;  Surgeon: Wilhelmenia Aloha Raddle., MD;  Location: [facility];  Service: Gastroenterology;  Laterality: N/A;   HEMORRHOID SURGERY  2005/2006   INTRAUTERINE DEVICE (IUD) INSERTION N/A 02/08/2020   Procedure: INTRAUTERINE DEVICE (IUD) INSERTION UNDER ULTRASOUND GUIDANCE;  Surgeon: Gloriann Chick, MD;  Location: [facility];  Service: Gynecology;  Laterality: N/A;  Mirena    LAPAROSCOPY N/A 01/11/2024   Procedure: LAPAROSCOPY, DIAGNOSTIC;  Surgeon: Aron Shoulders, MD;  Location: [facility];  Service: General;  Laterality: N/A;   LAPAROTOMY N/A 06/03/2019   Procedure: EXPLORATORY LAPAROTOMY WITH LYSIS OF ADHESIONS;  Surgeon: Curvin Deward MOULD, MD;  Location: [facility];  Service: General;  Laterality: N/A;   PARTIAL GASTRECTOMY N/A 01/11/2024   Procedure: GASTRECTOMY, PARTIAL;  Surgeon: Aron Shoulders, MD;  Location: [facility];  Service: General;  Laterality: N/A;  DISTAL GASTRECTOMY   PORTACATH  PLACEMENT N/A 02/15/2024   Procedure: INSERTION, TUNNELED CENTRAL VENOUS DEVICE, WITH PORT;  Surgeon: Aron Shoulders, MD;  Location: [facility];  Service: General;  Laterality: N/A;   SVT ABLATION N/A 10/03/2020   Procedure: SVT ABLATION;  Surgeon: Waddell Danelle ORN, MD;  Location: [facility];  Service: Cardiovascular;  Laterality: N/A;   UPPER GASTROINTESTINAL ENDOSCOPY     UTERINE FIBROID EMBOLIZATION  2010   at [facility]   Social History:  reports that she has never smoked. She has never used smokeless tobacco. She reports that she does not drink alcohol  and does not use drugs.  Allergies[1]  Family History  Problem Relation Age of Onset   Cancer Mother 69       breast   Stroke Mother    Vision loss Father        some   Heart disease Father        Rhythm disturbance   Hypertension Sister    Hypertension Brother    Hypertension Brother    Cancer Maternal Aunt 2       Breast   Cancer Maternal Grandmother 66       breast   Hypertension Maternal Grandmother    Cancer Other        breast, lung   Hypertension Other    Stroke Other    Diabetes Neg Hx    Colon cancer Neg Hx    Esophageal cancer Neg Hx    Stomach cancer Neg Hx    Rectal cancer Neg Hx      Prior to Admission medications  Medication Sig Start Date End Date Taking? Authorizing Provider  acetaminophen  (TYLENOL ) 500 MG tablet Take 500 mg by mouth every 6 (six) hours as needed for moderate pain (pain score 4-6).   Yes [provider]  amLODipine  (NORVASC ) 5 MG tablet Take 1 tablet (5 mg total) by mouth daily. 03/19/24  Yes Nche, Roselie Rockford, NP  atorvastatin  (LIPITOR) 80 MG tablet Take 1 tablet (80 mg total) by mouth daily. 09/21/23  Yes Thapa, Sudan, MD  diphenhydrAMINE -nystatin -alum & mag hydroxide-simeth-lidocaine  Take 5 mLs by mouth 4 (four) times daily as needed. 05/15/24  Yes Boscia, Heather E, NP  feeding supplement (ENSURE PLUS HIGH PROTEIN) LIQD Take 237 mLs by mouth 2 (two) times daily between meals.  01/19/24  Yes Aron Shoulders, MD  levonorgestrel  (MIRENA , 52 MG,) 20 MCG/DAY IUD 1 each by Intrauterine route once.   Yes [provider]  lidocaine -prilocaine  (EMLA ) cream Apply to affected area once. 03/30/24  Yes Lanny Callander, MD  ondansetron  (ZOFRAN ) 8 MG tablet Take 1 tablet (8 mg total) by mouth every 8 (eight) hours as needed for nausea or vomiting. Start on the third day after chemotherapy. 03/30/24  Yes Lanny Callander, MD  oxyCODONE  (OXY IR/ROXICODONE ) 5 MG immediate release tablet Take 1-2 tablets (5-10 mg total) by mouth every 4 (four) hours as needed for moderate pain (pain score 4-6). 02/15/24  Yes Aron Shoulders, MD  polyethylene glycol (MIRALAX  / GLYCOLAX ) 17 g packet Take 17 g by mouth daily as needed for moderate constipation.   Yes [provider]  potassium chloride  SA (KLOR-CON  M) 20 MEQ tablet Take 2 tablets (40 mEq total) by mouth 2 (two) times daily. Correction to previous prescription 02/23/24  Yes Lanny Callander, MD  dexamethasone  (DECADRON ) 4 MG tablet Take 2 tablets (8 mg total) by mouth daily. Start the day after chemotherapy for 2 days. Take with food. Patient not taking: Reported on 05/19/2024 03/30/24   Lanny Callander, MD  Glucose Blood (BLOOD GLUCOSE TEST STRIPS) STRP Use 1 each daily to test blood glucose levels. 05/20/23 05/19/24  Thapa, Sudan, MD  Glucose Blood (BLOOD GLUCOSE TEST STRIPS) STRP Use 1 each at morning, noon, and bedtime to check blood glucose. 01/23/24 06/04/24  Thapa, Sudan, MD  methocarbamol  (ROBAXIN ) 500 MG tablet Take 1 tablet (500 mg total) by mouth every 6 (six) hours as needed for muscle spasms. Patient not taking: Reported on 05/19/2024 01/19/24   Aron Shoulders, MD  ondansetron  (ZOFRAN -ODT) 4 MG disintegrating tablet Dissolve 1 tablet (4 mg total) by mouth every 6 (six) hours as needed for nausea. Patient not taking: Reported on 05/19/2024 01/19/24   Aron Shoulders, MD  pantoprazole  (PROTONIX ) 40 MG tablet Take 1 tablet (40 mg total) by mouth daily. Patient not  taking: Reported on 05/19/2024 08/16/23   Charlanne Groom, MD  prochlorperazine  (COMPAZINE ) 10 MG tablet Take 1 tablet (10 mg total) by mouth every 6 (six) hours as needed for nausea or vomiting. Patient not taking: Reported on 05/19/2024 03/30/24   Lanny Callander, MD    Physical Exam: BP 135/70   Pulse 78   Temp 98.5 F (36.9 C) (Oral)   Resp 16   Wt 58.8 kg   LMP  (LMP Unknown)   SpO2 100%   BMI 22.98 kg/m  General:  Alert, oriented, calm, in no acute distress, looks very weak, weak voice.  2 of her daughters are at the bedside. Oropharynx: Clear without any erythema  Neck: supple, no masses, trachea mildline  Cardiovascular: RRR, no murmurs or rubs, no peripheral edema  Respiratory: clear to auscultation bilaterally on anterior exam, no wheezes, no crackles, no cough Abdomen: soft, nontender, nondistended, normal bowel tones heard  Skin: dry, no rashes  Musculoskeletal: no joint effusions, normal range of motion  Psychiatric: appropriate affect, normal speech  Neurologic: extraocular muscles intact, clear speech, moving all extremities with intact sensorium         Labs on Admission:  Basic Metabolic Panel: Recent Labs  Lab 05/18/24 1444  NA 147*  K 3.9  CL 108  CO2 19*  GLUCOSE 192*  BUN 32*  CREATININE 0.98  CALCIUM  9.4   Liver Function Tests: Recent Labs  Lab 05/18/24 1444  AST 14*  ALT 9  ALKPHOS 73  BILITOT 1.5*  PROT 8.5*  ALBUMIN  4.4   Recent Labs  Lab 05/18/24 1444  LIPASE 14   Recent Labs  Lab 05/18/24 1444  AMMONIA <13   CBC: Recent Labs  Lab 05/18/24 1528  WBC 0.6*  NEUTROABS 0.0*  HGB 10.2*  HCT 30.3*  MCV 89.4  PLT 22*   Cardiac Enzymes: No results for input(s): CKTOTAL, CKMB, CKMBINDEX, TROPONINI in the last 168 hours. BNP (last 3 results) No results for input(s): BNP in the last 8760 hours.  ProBNP (last 3 results) No results for input(s): PROBNP in the last 8760 hours.  CBG: Recent Labs  Lab 05/18/24 1418  05/18/24 2231 05/19/24 0807  GLUCAP 193* 203* 146*    Radiological Exams on Admission: US  Venous Img Lower Unilateral Right Result Date:  05/18/2024 EXAM: ULTRASOUND DUPLEX OF THE RIGHT LOWER EXTREMITY VEINS TECHNIQUE: Duplex ultrasound using B-mode/gray scaled imaging and Doppler spectral analysis and color flow was obtained of the deep venous structures of the right lower extremity. COMPARISON: None available. CLINICAL HISTORY: Pain, hx of CA FINDINGS: The common femoral vein, femoral vein, popliteal vein, and posterior tibial vein demonstrate normal compressibility with normal color flow and spectral analysis. IMPRESSION: 1. No evidence of DVT. Electronically signed by: Elsie Gravely MD 05/18/2024 06:15 PM EST RP Workstation: [id number]   CT ABDOMEN PELVIS W CONTRAST Result Date: 05/18/2024 EXAM: CT ABDOMEN AND PELVIS WITH CONTRAST 05/18/2024 04:47:23 PM TECHNIQUE: CT of the abdomen and pelvis was performed with the administration of 80 mL of iohexol  (OMNIPAQUE ) 350 MG/ML injection. Multiplanar reformatted images are provided for review. Automated exposure control, iterative reconstruction, and/or weight-based adjustment of the mA/kV was utilized to reduce the radiation dose to as low as reasonably achievable. COMPARISON: CT enterography 11/11/2023, CT chest 11/29/2023. CLINICAL HISTORY: gastric ca, ams, emesis. FINDINGS: LOWER CHEST: Suggestion of distal esophageal wall thickening versus hiatal hernia. LIVER: Diffusely hypodense hepatic parenchyma compared to the spleen. GALLBLADDER AND BILE DUCTS: Gallbladder is unremarkable. No biliary ductal dilatation. SPLEEN: No acute abnormality. PANCREAS: No acute abnormality. ADRENAL GLANDS: No acute abnormality. KIDNEYS, URETERS AND BLADDER: No stones in the kidneys or ureters. No hydronephrosis. No perinephric or periureteral stranding. Urinary bladder is unremarkable. GI AND BOWEL: Gastric surgical changes noted. Limited evaluation of the gastric lumen due  to underdistension. Possible underlying gastric wall hypodense thickening. No small or large bowel thickening or dilatation. The appendix is not definitely identified with no inflammatory changes in the right lower quadrant to suggest acute appendicitis. PERITONEUM AND RETROPERITONEUM: No ascites. No free air. VASCULATURE: At least moderate atherosclerotic plaque. Aorta is normal in caliber. LYMPH NODES: No lymphadenopathy. REPRODUCTIVE ORGANS: Intramural calcified uterine fibroids. T-shaped intrauterine device in appropriate position. BONES AND SOFT TISSUES: No acute osseous abnormality. No focal soft tissue abnormality. IMPRESSION: 1. Limited evaluation of the gastric lumen due to underdistension, with postoperative gastric changes. Possible underlying gastric wall hypodense thickening. 2. Distal esophageal wall thickening versus a hiatal hernia. 3. No evidence of metastatic disease. Electronically signed by: Morgane Naveau MD 05/18/2024 05:05 PM EST RP Workstation: [id number]   CT Head Wo Contrast Result Date: 05/18/2024 EXAM: CT HEAD WITHOUT CONTRAST 05/18/2024 04:47:23 PM TECHNIQUE: CT of the head was performed without the administration of intravenous contrast. Automated exposure control, iterative reconstruction, and/or weight based adjustment of the mA/kV was utilized to reduce the radiation dose to as low as reasonably achievable. COMPARISON: None available. CLINICAL HISTORY: Delirium FINDINGS: BRAIN AND VENTRICLES: No acute hemorrhage. No evidence of acute infarct. No hydrocephalus. No extra-axial collection. No mass effect or midline shift. ORBITS: No acute abnormality. SINUSES: No acute abnormality. SOFT TISSUES AND SKULL: No acute soft tissue abnormality. No skull fracture. IMPRESSION: 1. No acute intracranial abnormality. Electronically signed by: Morgane Naveau MD 05/18/2024 04:57 PM EST RP Workstation: [id number]   CT Angio Chest PE W and/or Wo Contrast Result Date: 05/18/2024 EXAM: CTA CHEST  05/18/2024 04:47:23 PM TECHNIQUE: CTA of the chest was performed after the administration of 80 mL of iohexol  (OMNIPAQUE ) 350 MG/ML injection. Multiplanar reformatted images are provided for review. MIP images are provided for review. Automated exposure control, iterative reconstruction, and/or weight based adjustment of the mA/kV was utilized to reduce the radiation dose to as low as reasonably achievable. COMPARISON: CT enterography 11/11/2023, CT chest 11/29/2023. CLINICAL HISTORY: Pulmonary embolism (PE) suspected, high  prob; SOB, CA, AMS. FINDINGS: PULMONARY ARTERIES: Pulmonary arteries are adequately opacified for evaluation. No acute pulmonary embolus. Main pulmonary artery is normal in caliber. MEDIASTINUM: The heart and pericardium demonstrate no acute abnormality. There is no acute abnormality of the thoracic aorta. Left chest wall port-a-cath with tip at the superior vena cava-right atrial junction. LYMPH NODES: Right hilar lymphadenopathy measuring up to 1.4 cm. No mediastinal or axillary lymphadenopathy. LUNGS AND PLEURA: The lungs are without acute process. No focal consolidation or pulmonary edema. No evidence of pleural effusion or pneumothorax. UPPER ABDOMEN: Please see separately dictated CT abdomen and pelvis 05/18/2024 SOFT TISSUES AND BONES: No acute bone or soft tissue abnormality. IMPRESSION: 1. No pulmonary embolism. 2. Right hilar lymphadenopathy measuring up to 1.4 cm. Indeterminate etiology. Recommended attention on follow-up. Electronically signed by: Morgane Naveau MD 05/18/2024 04:55 PM EST RP Workstation: [id number]   DG Chest Portable 1 View Result Date: 05/18/2024 EXAM: 1 VIEW(S) XRAY OF THE CHEST 05/18/2024 03:46:00 PM COMPARISON: CXR 02/15/2024, CT Chest 11/29/2023. CLINICAL HISTORY: ams Patient is receiving chemo for abd cancer per daughters. Hx abd surg and colon resection. Here today w/ chemo mouth- oral sores and blisters, decreased appetite, aloc. Non smoker. FINDINGS:  LINES, TUBES AND DEVICES: Left chest Port-A-Cath in place with tip in distal SVC. LUNGS AND PLEURA: No focal pulmonary opacity. No pleural effusion. No pneumothorax. HEART AND MEDIASTINUM: No acute abnormality of the cardiac and mediastinal silhouettes. BONES AND SOFT TISSUES: No acute osseous abnormality. IMPRESSION: 1. No acute cardiopulmonary abnormality. 2. Left chest Port-A-Cath tip projects over the distal SVC. Electronically signed by: Morgane Naveau MD 05/18/2024 04:50 PM EST RP Workstation: [id number]   Assessment/Plan Susan Whitaker is a 54 y.o. female with medical history significant for well-controlled type 2 diabetes, GERD, hypertension, hyperlipidemia, gastric adenocarcinoma on neoadjuvant chemotherapy being admitted to the hospital with neutropenic fever.   Severe sepsis-with fever, tachycardia, leukopenia.  Endorgan dysfunction with lactate greater than 2.  She has neutropenic fever, possible source is UTI. -Inpatient admission -Fluid resuscitation and IV antibiotics as below -Trend lactate  Neutropenic fever-in the setting of neoadjuvant chemotherapy for gastric adenocarcinoma.  Patient does have some dysuria, no other infectious symptoms. -Neutropenic precautions -G-CSF support -Follow-up blood and urine cultures -Continue empiric IV vancomycin , IV Flagyl , IV cefepime   Stage Ib gastric adenocarcinoma-under the care of Dr. Lanny, she had distal partial gastrostomy August 2025 and is currently on adjuvant chemotherapy. -Oncology consulted, their input is greatly appreciated  Hypernatremia-likely due to the degree of her dehydration, received IV fluid bolus and is on LR infusion -Recheck CMP now, and trend  Pancytopenia-due to chemotherapy -Currently no indication for transfusion  Type 2 diabetes-historically very well-controlled -Carb modified diet when eating -Sliding scale insulin   DVT prophylaxis: SCDs only due to thrombocytopenia    Code Status: Full Code  Consults  called: Oncology Dr. Lonn was consulted by the ER  Admission status: The appropriate patient status for this patient is INPATIENT. Inpatient status is judged to be reasonable and necessary in order to provide the required intensity of service to ensure the patient's safety. The patient's presenting symptoms, physical exam findings, and initial radiographic and laboratory data in the context of their chronic comorbidities is felt to place them at high risk for further clinical deterioration. Furthermore, it is not anticipated that the patient will be medically stable for discharge from the hospital within 2 midnights of admission.    I certify that at the point of admission it is my clinical judgment  that the patient will require inpatient hospital care spanning beyond 2 midnights from the point of admission due to high intensity of service, high risk for further deterioration and high frequency of surveillance required  Time spent: 53 minutes  Cydne Grahn CHRISTELLA Gail MD [facility] Pager [phone]  If 7PM-7AM, please contact night-coverage [email or web address] Password [email or web address]  05/19/2024, 8:39 AM      [1]  Allergies Allergen Reactions   Lisinopril Cough   "

## 2024-05-19 NOTE — ED Notes (Signed)
 [facility] called for transport to [facility] ER 01:33a (Ruby)

## 2024-05-19 NOTE — ED Notes (Signed)
 Patient is resting comfortably at this time. 2 family members at bedside. No needs expressed at this time.

## 2024-05-19 NOTE — ED Notes (Signed)
 Call floor, spoke with Joen, pt can come up at 9:30.

## 2024-05-20 ENCOUNTER — Other Ambulatory Visit: Payer: Self-pay | Admitting: Hematology and Oncology

## 2024-05-20 DIAGNOSIS — D709 Neutropenia, unspecified: Secondary | ICD-10-CM | POA: Diagnosis not present

## 2024-05-20 DIAGNOSIS — R5081 Fever presenting with conditions classified elsewhere: Secondary | ICD-10-CM | POA: Diagnosis not present

## 2024-05-20 LAB — COMPREHENSIVE METABOLIC PANEL WITH GFR
ALT: 46 U/L — ABNORMAL HIGH (ref 0–44)
AST: 56 U/L — ABNORMAL HIGH (ref 15–41)
Albumin: 3 g/dL — ABNORMAL LOW (ref 3.5–5.0)
Alkaline Phosphatase: 47 U/L (ref 38–126)
Anion gap: 10 (ref 5–15)
BUN: 15 mg/dL (ref 6–20)
CO2: 22 mmol/L (ref 22–32)
Calcium: 8 mg/dL — ABNORMAL LOW (ref 8.9–10.3)
Chloride: 115 mmol/L — ABNORMAL HIGH (ref 98–111)
Creatinine, Ser: 0.71 mg/dL (ref 0.44–1.00)
GFR, Estimated: >60 mL/min
Glucose, Bld: 106 mg/dL — ABNORMAL HIGH (ref 70–99)
Potassium: 3 mmol/L — ABNORMAL LOW (ref 3.5–5.1)
Sodium: 146 mmol/L — ABNORMAL HIGH (ref 135–145)
Total Bilirubin: 1 mg/dL (ref 0.0–1.2)
Total Protein: 6.1 g/dL — ABNORMAL LOW (ref 6.5–8.1)

## 2024-05-20 LAB — PREPARE RBC (CROSSMATCH)

## 2024-05-20 LAB — HIV ANTIBODY (ROUTINE TESTING W REFLEX): HIV Screen 4th Generation wRfx: NONREACTIVE

## 2024-05-20 LAB — LACTIC ACID, PLASMA
Lactic Acid, Venous: 2.6 mmol/L (ref 0.5–1.9)
Lactic Acid, Venous: 2.5 mmol/L (ref 0.5–1.9)

## 2024-05-20 LAB — URINE CULTURE: Culture: <10000 — AB

## 2024-05-20 LAB — GLUCOSE, CAPILLARY
Glucose-Capillary: 89 mg/dL (ref 70–99)
Glucose-Capillary: 92 mg/dL (ref 70–99)
Glucose-Capillary: 106 mg/dL — ABNORMAL HIGH (ref 70–99)
Glucose-Capillary: 182 mg/dL — ABNORMAL HIGH (ref 70–99)

## 2024-05-20 LAB — MAGNESIUM: Magnesium: 2.2 mg/dL (ref 1.7–2.4)

## 2024-05-20 MED ORDER — POTASSIUM CHLORIDE 10 MEQ/50ML IV SOLN
10.0000 meq | INTRAVENOUS | Status: AC
  Administered 2024-05-20 (×5): 10 meq via INTRAVENOUS
  Filled 2024-05-20 (×5): qty 50

## 2024-05-20 MED ORDER — ACETAMINOPHEN 325 MG PO TABS
650.0000 mg | ORAL_TABLET | Freq: Once | ORAL | Status: AC
  Administered 2024-05-20: 650 mg via ORAL
  Filled 2024-05-20: qty 2

## 2024-05-20 MED ORDER — DIPHENHYDRAMINE HCL 25 MG PO CAPS
25.0000 mg | ORAL_CAPSULE | Freq: Once | ORAL | Status: AC
  Administered 2024-05-20: 25 mg via ORAL
  Filled 2024-05-20: qty 1

## 2024-05-20 MED ORDER — POTASSIUM CHLORIDE 2 MEQ/ML IV SOLN
INTRAVENOUS | Status: DC
  Filled 2024-05-20 (×6): qty 1000

## 2024-05-20 MED ORDER — CHLORHEXIDINE GLUCONATE 0.12 % MT SOLN
10.0000 mL | Freq: Four times a day (QID) | OROMUCOSAL | Status: DC
  Administered 2024-05-20 – 2024-05-24 (×5): 10 mL via OROMUCOSAL
  Filled 2024-05-20 (×10): qty 15

## 2024-05-20 MED ORDER — SODIUM CHLORIDE 0.9% IV SOLUTION: Freq: Once | INTRAVENOUS | Status: AC

## 2024-05-20 NOTE — Progress Notes (Addendum)
 Per Oncology provider, increased temperature not likely related to PRBC transfusion. No need for transfusion reaction report completion. Patient does not exhibit signs of transfusion reaction. On assessment no urticaria,rigors, dyspnea, irregular HR/rhythm, hypotension/hypertension. RR are equal and non-labored. She denies SOB, C/P, Chest tightness, chills, itching, N/V or pain. Hospitalist provider notified. PRBC unit returned to blood bank per lab technician.

## 2024-05-20 NOTE — Progress Notes (Signed)
 Susan Whitaker   DOB:1971/02/21   FM#:[id number]    ASSESSMENT & PLAN:   Stomach cancer She had received chemotherapy recently and her treatment was complicated by fever and mucositis in the outpatient It is unlikely she will be well enough to receive further chemotherapy this coming week I will cancel her outpatient chemotherapy this week We will continue supportive care only for now  Acquired pancytopenia Neutropenic fever Her labs this morning showed slight worsening pancytopenia We discussed some of the risks, benefits, and alternatives of blood transfusions. The patient is symptomatic from anemia and the hemoglobin level is critically low.  Some of the side-effects to be expected including risks of transfusion reactions, chills, infection, syndrome of volume overload and risk of hospitalization from various reasons and the patient is willing to proceed and went ahead to sign consent today. I recommend 1 unit of blood She does not need platelet transfusion unless platelet count less than 10,000 or if she has signs of bleeding Cultures so far are negative Continue IV antibiotics and G-CSF support until ANC is greater than 1.5  Dehydration, high lactic acid, hypernatremia This is all consistent with dehydration Continue IV fluid support  Hypokalemia Due to poor oral intake and side effects of antibiotics Continue potassium replacement therapy as directed by primary service  Severe mucositis pain Will prescribe Magic mouthwash swish and spit along with Dilaudid  IV as needed I warned the patient and family risk of excessive sedation with IV pain medicine, advising them to use it judiciously So far, her pain is well-controlled today  Discharge planning She would likely be here over the next few days  [name], MD 05/20/2024 12:57 PM  Subjective:  The patient appears alert.  Daughter by the bedside.  Afebrile overnight.  Still relatively weak.  Attempted to eat some broth today.  No  signs or symptoms of bleeding  Objective:  Vitals:   05/20/24 0904 05/20/24 1148  BP: 114/71 124/70  Pulse: 61 63  Resp: 16 16  Temp: 98.2 F (36.8 C) 99.6 F (37.6 C)  SpO2: 100% 100%     Intake/Output Summary (Last 24 hours) at 05/20/2024 1257 Last data filed at 05/20/2024 1135 Gross per 24 hour  Intake 3283.96 ml  Output 925 ml  Net 2358.96 ml

## 2024-05-20 NOTE — Progress Notes (Signed)
 OT Cancellation Note  Patient Details Name: Susan Whitaker MRN: [id number] DOB: 1971-03-22   Cancelled Treatment:    Reason Eval/Treat Not Completed: Fatigue/lethargy limiting ability to participate;Other (comment). Pt received in bed with family in room, daughter reporting pt recently started eating small amounts of food. Pt overall feeling very poor and lethargic, requesting OT return tomorrow for evaluation. OT will follow and check back as able.   [name], OTR/L  05/20/2024, 11:27 AM

## 2024-05-20 NOTE — Progress Notes (Signed)
 PT Cancellation Note  Patient Details Name: Susan Whitaker MRN: [id number] DOB: [birth year]   Cancelled Treatment:    Reason Eval/Treat Not Completed: Fatigue/lethargy limiting ability to participate  Pt overall feeling very poor and lethargic, requesting PT return tomorrow for evaluation. PT will follow and check back as able.    Rexene, PT  Acute Rehab Dept [facility]) [phone]  05/20/2024  [facility] 05/20/2024, 11:34 AM

## 2024-05-20 NOTE — Plan of Care (Signed)

## 2024-05-20 NOTE — Progress Notes (Signed)
 " PROGRESS NOTE    Susan Whitaker  FMW:[id number] DOB: [birth year] DOA: 05/18/2024 PCP: Katheen Roselie Rockford, NP    Brief Narrative:54 y.o. female with medical history significant for well-controlled type 2 diabetes, GERD, hypertension, hyperlipidemia, gastric adenocarcinoma on neoadjuvant chemotherapy being admitted to the hospital with neutropenic fever.  History is provided mainly by the patient's 2 daughters were at the bedside, as well as discussion with the patient.  For the last several days the patient has been very weak and lethargic, she had a syncopal episode earlier this week when she was getting out of the shower.  She also has mucositis and it has been having very poor oral intake and some mild nausea.the patient does admit to some dysuria that started a couple of days ago.    Assessment & Plan:   Principal Problem:   Neutropenic fever Active Problems:   Pancytopenia, acquired (HCC)   #1 neutropenic fever/Mucositis -patient admitted with fever s/p recent chemotherapy she was tachycardic tachypneic with lactic acidosis on admission.  Urine culture came back as mixed growth.  Patient was started on empiric antibiotics with vancomycin  cefepime  and Flagyl .  Narrow as able.  ANC 0.  On colony-stimulating factor till ANC higher than 1.5. T max 102.3 Follow blood cultures continue empiric antibiotics.  #2 pancytopenia due to recent chemotherapy with drop in hemoglobin further possible hemodilution, no evidence of active bleeding, platelets low at 26.  Will discuss blood transfusion with patient and family.  #3 history of stage Ib gastric adenocarcinoma status post recent chemotherapy followed by Dr. Lanny complicated by fever and mucositis.  She is status post distal partial gastrostomy in August 2025 and is on adjuvant chemotherapy. Continue miracle  mouthwash Tolerated fruits and chicken broth today.   #4 hypernatremia with last UA with ketones in the urine consistent with dehydration  due to poor oral intake mucositis recent chemotherapy.  Continue IV fluids changed to D5 with potassium.    #5 history of diet-controlled diabetes CBG (last 3)  Recent Labs    05/19/24 2125 05/20/24 0728 05/20/24 1050  GLUCAP 142* 89 92   Continue D5   Estimated body mass index is 22.65 kg/m as calculated from the following:   Height as of this encounter: 5' 3 (1.6 m).   Weight as of this encounter: 58 kg.  DVT prophylaxis: None due to thrombocytopenia  code Status: Full code Family Communication:dw daughter Disposition Plan:  Status is: Inpatient Remains inpatient appropriate because:    Consultants: acute illness  Procedures: None Antimicrobials: Vanco cefepime  and Flagyl   Subjective: She is resting in bed appears weak oral mucosa dry daughter at bedside she tolerated some broth and fluids today for the first time in 2 weeks no BMs in few days also having not really anything to eat for more than a week due to pain in mouth  Objective: Vitals:   05/20/24 0106 05/20/24 0454 05/20/24 0904 05/20/24 1148  BP: (!) 106/54 122/66 114/71 124/70  Pulse: 73 75 61 63  Resp: 12 12 16 16   Temp: 98.2 F (36.8 C) 100.3 F (37.9 C) 98.2 F (36.8 C) 99.6 F (37.6 C)  TempSrc: Oral Oral Oral Oral  SpO2: 99% 98% 100% 100%  Weight:      Height:        Intake/Output Summary (Last 24 hours) at 05/20/2024 1158 Last data filed at 05/20/2024 1135 Gross per 24 hour  Intake 3258.96 ml  Output 925 ml  Net 2333.96 ml   [employer]  05/18/24 1420 05/19/24 1323  Weight: 58.8 kg 58 kg    Examination:  General exam: Appears weak  Oral mucosa dry with evidence of mucositis Respiratory system: Clear to auscultation. Respiratory effort normal. Cardiovascular system: S1 & S2 heard, RRR. No JVD, murmurs, rubs, gallops or clicks. No pedal edema. Gastrointestinal system: Abdomen is nondistended, soft and nontender. No organomegaly or masses felt. Normal bowel sounds heard. Central  nervous system: Alert and oriented. No focal neurological deficits. Extremities: No edema  Data Reviewed: I have personally reviewed following labs and imaging studies  CBC: Recent Labs  Lab 05/18/24 1528 05/19/24 0822 05/20/24 0320  WBC 0.6* 0.7* 0.7*  NEUTROABS 0.0*  --  0.0*  HGB 10.2* 8.2* 7.0*  HCT 30.3* 24.9* 21.2*  MCV 89.4 91.5 91.0  PLT 22* 44* 26*   Basic Metabolic Panel: Recent Labs  Lab 05/18/24 1444 05/19/24 0822 05/19/24 0850 05/19/24 1827 05/20/24 0320  NA 147* 149*  --  150* 146*  K 3.9 2.8*  --  2.9* 3.0*  CL 108 116*  --  115* 115*  CO2 19* 23  --  23 22  GLUCOSE 192* 156*  --  121* 106*  BUN 32* 21*  --  18 15  CREATININE 0.98 0.81  --  0.74 0.71  CALCIUM  9.4 8.4*  --  8.6* 8.0*  MG  --   --  2.3  --  2.2   GFR: Estimated Creatinine Clearance: 65.7 mL/min (by C-G formula based on SCr of 0.71 mg/dL). Liver Function Tests: Recent Labs  Lab 05/18/24 1444 05/19/24 0822 05/20/24 0320  AST 14* 25 56*  ALT 9 19 46*  ALKPHOS 73 60 47  BILITOT 1.5* 1.1 1.0  PROT 8.5* 6.9 6.1*  ALBUMIN  4.4 3.4* 3.0*   Recent Labs  Lab 05/18/24 1444  LIPASE 14   Recent Labs  Lab 05/18/24 1444  AMMONIA <13   Coagulation Profile: No results for input(s): INR, PROTIME in the last 168 hours. Cardiac Enzymes: No results for input(s): CKTOTAL, CKMB, CKMBINDEX, TROPONINI in the last 168 hours. BNP (last 3 results) No results for input(s): PROBNP in the last 8760 hours. HbA1C: No results for input(s): HGBA1C in the last 72 hours. CBG: Recent Labs  Lab 05/19/24 1209 05/19/24 1635 05/19/24 2125 05/20/24 0728 05/20/24 1050  GLUCAP 131* 135* 142* 89 92   Lipid Profile: No results for input(s): CHOL, HDL, LDLCALC, TRIG, CHOLHDL, LDLDIRECT in the last 72 hours. Thyroid  Function Tests: No results for input(s): TSH, T4TOTAL, FREET4, T3FREE, THYROIDAB in the last 72 hours. Anemia Panel: No results for input(s):  VITAMINB12, FOLATE, FERRITIN, TIBC, IRON, RETICCTPCT in the last 72 hours. Sepsis Labs: Recent Labs  Lab 05/18/24 1426 05/18/24 1518 05/19/24 0822 05/19/24 1336  LATICACIDVEN 2.5* 3.4* 2.4* 2.3*    Recent Results (from the past 240 hours)  Resp panel by RT-PCR (RSV, Flu A&B, Covid) Anterior Nasal Swab     Status: None   Collection Time: 05/18/24  2:26 PM   Specimen: Anterior Nasal Swab  Result Value Ref Range Status   SARS Coronavirus 2 by RT PCR NEGATIVE NEGATIVE Final    Comment: (NOTE) SARS-CoV-2 target nucleic acids are NOT DETECTED.  The SARS-CoV-2 RNA is generally detectable in upper respiratory specimens during the acute phase of infection. The lowest concentration of SARS-CoV-2 viral copies this assay can detect is 138 copies/mL. A negative result does not preclude SARS-Cov-2 infection and should not be used as the sole basis for treatment or other patient  management decisions. A negative result may occur with  improper specimen collection/handling, submission of specimen other than nasopharyngeal swab, presence of viral mutation(s) within the areas targeted by this assay, and inadequate number of viral copies(<138 copies/mL). A negative result must be combined with clinical observations, patient history, and epidemiological information. The expected result is Negative.  Fact Sheet for Patients:  bloggercourse.com  Fact Sheet for Healthcare Providers:  seriousbroker.it  This test is no t yet approved or cleared by the [place]  FDA and  has been authorized for detection and/or diagnosis of SARS-CoV-2 by FDA under an Emergency Use Authorization (EUA). This EUA will remain  in effect (meaning this test can be used) for the duration of the COVID-19 declaration under Section 564(b)(1) of the Act, 21 U.S.C.section 360bbb-3(b)(1), unless the authorization is terminated  or revoked sooner.        Influenza A by PCR NEGATIVE NEGATIVE Final   Influenza B by PCR NEGATIVE NEGATIVE Final    Comment: (NOTE) The Xpert Xpress SARS-CoV-2/FLU/RSV plus assay is intended as an aid in the diagnosis of influenza from Nasopharyngeal swab specimens and should not be used as a sole basis for treatment. Nasal washings and aspirates are unacceptable for Xpert Xpress SARS-CoV-2/FLU/RSV testing.  Fact Sheet for Patients: bloggercourse.com  Fact Sheet for Healthcare Providers: seriousbroker.it  This test is not yet approved or cleared by the [place]  FDA and has been authorized for detection and/or diagnosis of SARS-CoV-2 by FDA under an Emergency Use Authorization (EUA). This EUA will remain in effect (meaning this test can be used) for the duration of the COVID-19 declaration under Section 564(b)(1) of the Act, 21 U.S.C. section 360bbb-3(b)(1), unless the authorization is terminated or revoked.     Resp Syncytial Virus by PCR NEGATIVE NEGATIVE Final    Comment: (NOTE) Fact Sheet for Patients: bloggercourse.com  Fact Sheet for Healthcare Providers: seriousbroker.it  This test is not yet approved or cleared by the [place]  FDA and has been authorized for detection and/or diagnosis of SARS-CoV-2 by FDA under an Emergency Use Authorization (EUA). This EUA will remain in effect (meaning this test can be used) for the duration of the COVID-19 declaration under Section 564(b)(1) of the Act, 21 U.S.C. section 360bbb-3(b)(1), unless the authorization is terminated or revoked.  Performed at [facility], [address] [place], [place] [address]   Blood culture (routine x 2)     Status: None (Preliminary result)   Collection Time: 05/18/24  2:47 PM   Specimen: BLOOD  Result Value Ref Range Status   Specimen Description   Final    BLOOD LEFT ANTECUBITAL Performed at [facility], [address] [place], [place] [address]    Special Requests   Final    BOTTLES DRAWN AEROBIC AND ANAEROBIC Blood Culture adequate volume Performed at [facility], [address] [place], [place] [address]    Culture   Final    NO GROWTH 2 DAYS Performed at [facility], [address], [place], [place] [address]    Report Status PENDING  Incomplete  Urine Culture (for pregnant, neutropenic or urologic patients or patients with an indwelling urinary catheter)     Status: Abnormal   Collection Time: 05/18/24  3:18 PM   Specimen: In/Out Cath Urine  Result Value Ref Range Status   Specimen Description   Final    IN/OUT CATH URINE Performed at [facility], [address] [place], [place] [address]    Special Requests   Final    NONE Performed at [facility], [address] [place], [place] [address]    Culture (A)  Final    <10,000 COLONIES/mL INSIGNIFICANT GROWTH Performed at [facility], [address] [place], [place] [address]    Report Status 05/20/2024 FINAL  Final         Radiology Studies: US  Venous Img Lower Unilateral Right Result Date: 05/18/2024 EXAM: ULTRASOUND DUPLEX OF THE RIGHT LOWER EXTREMITY VEINS TECHNIQUE: Duplex ultrasound using B-mode/gray scaled imaging and Doppler spectral analysis and color flow was obtained of the deep venous structures of the right lower extremity. COMPARISON: None available. CLINICAL HISTORY: Pain, hx of CA FINDINGS: The common femoral vein, femoral vein, popliteal vein, and posterior tibial vein demonstrate normal compressibility with normal color flow and spectral analysis. IMPRESSION: 1. No evidence of DVT. Electronically signed by: Elsie Gravely MD 05/18/2024 06:15 PM EST RP Workstation: [id number]   CT ABDOMEN PELVIS W CONTRAST Result Date: 05/18/2024 EXAM: CT ABDOMEN AND PELVIS WITH CONTRAST 05/18/2024 04:47:23 PM TECHNIQUE: CT of the abdomen and pelvis was  performed with the administration of 80 mL of iohexol  (OMNIPAQUE ) 350 MG/ML injection. Multiplanar reformatted images are provided for review. Automated exposure control, iterative reconstruction, and/or weight-based adjustment of the mA/kV was utilized to reduce the radiation dose to as low as reasonably achievable. COMPARISON: CT enterography 11/11/2023, CT chest 11/29/2023. CLINICAL HISTORY: gastric ca, ams, emesis. FINDINGS: LOWER CHEST: Suggestion of distal esophageal wall thickening versus hiatal hernia. LIVER: Diffusely hypodense hepatic parenchyma compared to the spleen. GALLBLADDER AND BILE DUCTS: Gallbladder is unremarkable. No biliary ductal dilatation. SPLEEN: No acute abnormality. PANCREAS: No acute abnormality. ADRENAL GLANDS: No acute abnormality. KIDNEYS, URETERS AND BLADDER: No stones in the kidneys or ureters. No hydronephrosis. No perinephric or periureteral stranding. Urinary bladder is unremarkable. GI AND BOWEL: Gastric surgical changes noted. Limited evaluation of the gastric lumen due to underdistension. Possible underlying gastric wall hypodense thickening. No small or large bowel thickening or dilatation. The appendix is not definitely identified with no inflammatory changes in the right lower quadrant to suggest acute appendicitis. PERITONEUM AND RETROPERITONEUM: No ascites. No free air. VASCULATURE: At least moderate atherosclerotic plaque. Aorta is normal in caliber. LYMPH NODES: No lymphadenopathy. REPRODUCTIVE ORGANS: Intramural calcified uterine fibroids. T-shaped intrauterine device in appropriate position. BONES AND SOFT TISSUES: No acute osseous abnormality. No focal soft tissue abnormality. IMPRESSION: 1. Limited evaluation of the gastric lumen due to underdistension, with postoperative gastric changes. Possible underlying gastric wall hypodense thickening. 2. Distal esophageal wall thickening versus a hiatal hernia. 3. No evidence of metastatic disease. Electronically signed by:  Morgane Naveau MD 05/18/2024 05:05 PM EST RP Workstation: [id number]   CT Head Wo Contrast Result Date: 05/18/2024 EXAM: CT HEAD WITHOUT CONTRAST 05/18/2024 04:47:23 PM TECHNIQUE: CT of the head was performed without the administration of intravenous contrast. Automated exposure control, iterative reconstruction, and/or weight based adjustment of the mA/kV was utilized to reduce the radiation dose to as low as reasonably achievable. COMPARISON: None available. CLINICAL HISTORY: Delirium FINDINGS: BRAIN AND VENTRICLES: No acute hemorrhage. No evidence of acute infarct. No hydrocephalus. No extra-axial collection. No mass effect or midline shift. ORBITS: No acute abnormality. SINUSES: No acute abnormality. SOFT TISSUES AND SKULL: No acute soft tissue abnormality. No skull fracture. IMPRESSION: 1. No acute intracranial abnormality. Electronically signed by: Morgane Naveau MD 05/18/2024 04:57 PM EST RP Workstation: [id number]   CT Angio Chest  PE W and/or Wo Contrast Result Date: 05/18/2024 EXAM: CTA CHEST 05/18/2024 04:47:23 PM TECHNIQUE: CTA of the chest was performed after the administration of 80 mL of iohexol  (OMNIPAQUE ) 350 MG/ML injection. Multiplanar reformatted images are provided for review. MIP images are provided for review. Automated exposure control, iterative reconstruction, and/or weight based adjustment of the mA/kV was utilized to reduce the radiation dose to as low as reasonably achievable. COMPARISON: CT enterography 11/11/2023, CT chest 11/29/2023. CLINICAL HISTORY: Pulmonary embolism (PE) suspected, high prob; SOB, CA, AMS. FINDINGS: PULMONARY ARTERIES: Pulmonary arteries are adequately opacified for evaluation. No acute pulmonary embolus. Main pulmonary artery is normal in caliber. MEDIASTINUM: The heart and pericardium demonstrate no acute abnormality. There is no acute abnormality of the thoracic aorta. Left chest wall port-a-cath with tip at the superior vena cava-right atrial junction.  LYMPH NODES: Right hilar lymphadenopathy measuring up to 1.4 cm. No mediastinal or axillary lymphadenopathy. LUNGS AND PLEURA: The lungs are without acute process. No focal consolidation or pulmonary edema. No evidence of pleural effusion or pneumothorax. UPPER ABDOMEN: Please see separately dictated CT abdomen and pelvis 05/18/2024 SOFT TISSUES AND BONES: No acute bone or soft tissue abnormality. IMPRESSION: 1. No pulmonary embolism. 2. Right hilar lymphadenopathy measuring up to 1.4 cm. Indeterminate etiology. Recommended attention on follow-up. Electronically signed by: Morgane Naveau MD 05/18/2024 04:55 PM EST RP Workstation: [id number]   DG Chest Portable 1 View Result Date: 05/18/2024 EXAM: 1 VIEW(S) XRAY OF THE CHEST 05/18/2024 03:46:00 PM COMPARISON: CXR 02/15/2024, CT Chest 11/29/2023. CLINICAL HISTORY: ams Patient is receiving chemo for abd cancer per daughters. Hx abd surg and colon resection. Here today w/ chemo mouth- oral sores and blisters, decreased appetite, aloc. Non smoker. FINDINGS: LINES, TUBES AND DEVICES: Left chest Port-A-Cath in place with tip in distal SVC. LUNGS AND PLEURA: No focal pulmonary opacity. No pleural effusion. No pneumothorax. HEART AND MEDIASTINUM: No acute abnormality of the cardiac and mediastinal silhouettes. BONES AND SOFT TISSUES: No acute osseous abnormality. IMPRESSION: 1. No acute cardiopulmonary abnormality. 2. Left chest Port-A-Cath tip projects over the distal SVC. Electronically signed by: Morgane Naveau MD 05/18/2024 04:50 PM EST RP Workstation: [id number]    Scheduled Meds:  amLODipine   5 mg Oral Daily   atorvastatin   80 mg Oral Daily   chlorhexidine   10 mL Mouth/Throat QID   Chlorhexidine  Gluconate Cloth  6 each Topical Daily   filgrastim  (NIVESTYM ) SQ  300 mcg Subcutaneous q1800   insulin  aspart  0-5 Units Subcutaneous QHS   insulin  aspart  0-6 Units Subcutaneous TID WC   magic mouthwash w/lidocaine   5 mL Oral QID   sodium chloride  flush   10-40 mL Intracatheter Q12H   Continuous Infusions:  ceFEPime  (MAXIPIME ) IV 2 g (05/20/24 0900)   dextrose  5 % 1,000 mL with potassium chloride  40 mEq infusion 125 mL/hr at 05/20/24 1135   metronidazole  500 mg (05/20/24 1010)   vancomycin  Stopped (05/19/24 1921)     LOS: 1 day    Almarie KANDICE Hoots, MD 05/20/2024, 11:58 AM   "

## 2024-05-20 NOTE — Progress Notes (Signed)
 Lactic sent  to lab at 1246

## 2024-05-20 NOTE — Progress Notes (Signed)
 Temperature rechecked per Oncology provider recommendation at 1500, T 101.5. Blood returned to blood bank. No changes from am assessment. Pt is resting RR are equal and non labored.

## 2024-05-20 NOTE — Progress Notes (Signed)
 Lactic sent to lab 1635

## 2024-05-20 NOTE — Progress Notes (Signed)
 Pre medications administered at 1400,Temperature 100.4 prior to PRBC transfusion. Provider updated, V.O received to proceed with transfusion. PRBC initiated at 1415, repeat temperature 101.4 at 1430, for 15 minute VS check. Providers updated, V.O to hold PRBC until repeat Temp check. Okay to proceed if Temp less than 100. PRBC placed on hold at 1447 until 1500 for repeat temp check.

## 2024-05-21 DIAGNOSIS — D709 Neutropenia, unspecified: Secondary | ICD-10-CM | POA: Diagnosis not present

## 2024-05-21 DIAGNOSIS — R5081 Fever presenting with conditions classified elsewhere: Secondary | ICD-10-CM | POA: Diagnosis not present

## 2024-05-21 LAB — PREPARE PLATELET PHERESIS: Unit division: 0

## 2024-05-21 LAB — GLUCOSE, CAPILLARY
Glucose-Capillary: 169 mg/dL — ABNORMAL HIGH (ref 70–99)
Glucose-Capillary: 163 mg/dL — ABNORMAL HIGH (ref 70–99)
Glucose-Capillary: 109 mg/dL — ABNORMAL HIGH (ref 70–99)
Glucose-Capillary: 79 mg/dL (ref 70–99)
Glucose-Capillary: 80 mg/dL (ref 70–99)

## 2024-05-21 LAB — TYPE AND SCREEN
ABO/RH(D): O POS
Antibody Screen: NEGATIVE
Unit division: 0

## 2024-05-21 LAB — CBC WITH DIFFERENTIAL/PLATELET
Abs Immature Granulocytes: 0 K/uL (ref 0.00–0.07)
Basophils Absolute: 0 K/uL (ref 0.0–0.1)
Basophils Relative: 0 %
Eosinophils Absolute: 0 K/uL (ref 0.0–0.5)
Eosinophils Relative: 4 %
HCT: 21.8 % — ABNORMAL LOW (ref 36.0–46.0)
Hemoglobin: 7.2 g/dL — ABNORMAL LOW (ref 12.0–15.0)
Immature Granulocytes: 0 %
Lymphocytes Relative: 81 %
Lymphs Abs: 0.8 K/uL (ref 0.7–4.0)
MCH: 30 pg (ref 26.0–34.0)
MCHC: 33 g/dL (ref 30.0–36.0)
MCV: 90.8 fL (ref 80.0–100.0)
Monocytes Absolute: 0 K/uL — ABNORMAL LOW (ref 0.1–1.0)
Monocytes Relative: 3 %
Neutro Abs: 0.1 K/uL — CL (ref 1.7–7.7)
Neutrophils Relative %: 12 %
Platelets: 30 K/uL — ABNORMAL LOW (ref 150–400)
RBC: 2.4 MIL/uL — ABNORMAL LOW (ref 3.87–5.11)
RDW: 19.9 % — ABNORMAL HIGH (ref 11.5–15.5)
WBC: 1 K/uL — CL (ref 4.0–10.5)
nRBC: 0 % (ref 0.0–0.2)

## 2024-05-21 LAB — COMPREHENSIVE METABOLIC PANEL WITH GFR
ALT: 82 U/L — ABNORMAL HIGH (ref 0–44)
AST: 106 U/L — ABNORMAL HIGH (ref 15–41)
Albumin: 2.8 g/dL — ABNORMAL LOW (ref 3.5–5.0)
Alkaline Phosphatase: 48 U/L (ref 38–126)
Anion gap: 9 (ref 5–15)
BUN: 11 mg/dL (ref 6–20)
CO2: 21 mmol/L — ABNORMAL LOW (ref 22–32)
Calcium: 7.8 mg/dL — ABNORMAL LOW (ref 8.9–10.3)
Chloride: 110 mmol/L (ref 98–111)
Creatinine, Ser: 0.59 mg/dL (ref 0.44–1.00)
GFR, Estimated: >60 mL/min
Glucose, Bld: 172 mg/dL — ABNORMAL HIGH (ref 70–99)
Potassium: 3.4 mmol/L — ABNORMAL LOW (ref 3.5–5.1)
Sodium: 140 mmol/L (ref 135–145)
Total Bilirubin: 0.7 mg/dL (ref 0.0–1.2)
Total Protein: 5.9 g/dL — ABNORMAL LOW (ref 6.5–8.1)

## 2024-05-21 LAB — BPAM PLATELET PHERESIS
Blood Product Expiration Date: 202601052359
ISSUE DATE / TIME: 202601030430
Unit Type and Rh: 7300

## 2024-05-21 LAB — BPAM RBC
Blood Product Expiration Date: 202602012359
ISSUE DATE / TIME: 202601041409
Unit Type and Rh: 5100

## 2024-05-21 LAB — MAGNESIUM: Magnesium: 2.1 mg/dL (ref 1.7–2.4)

## 2024-05-21 MED ORDER — SODIUM CHLORIDE 0.45 % IV SOLN
INTRAVENOUS | Status: DC
  Filled 2024-05-21 (×4): qty 1000

## 2024-05-21 MED ORDER — CARMEX CLASSIC LIP BALM EX OINT
TOPICAL_OINTMENT | CUTANEOUS | Status: DC | PRN
  Filled 2024-05-21: qty 10

## 2024-05-21 NOTE — Plan of Care (Addendum)
 Some labs remain critical but improved No fever overnight Family at bedside   Latest Reference Range & Units 05/21/24 03:10  WBC 4.0 - 10.5 K/uL 1.0 (LL)  RBC 3.87 - 5.11 MIL/uL 2.40 (L)  Hemoglobin 12.0 - 15.0 g/dL 7.2 (L)  HCT 63.9 - 53.9 % 21.8 (L)  MCV 80.0 - 100.0 fL 90.8  MCH 26.0 - 34.0 pg 30.0  MCHC 30.0 - 36.0 g/dL 66.9  RDW 88.4 - 84.4 % 19.9 (H)  Platelets 150 - 400 K/uL 30 (L)  nRBC 0.0 - 0.2 % 0.0  (LL): Data is critically low (L): Data is abnormally low (H): Data is abnormally high  Problem: Clinical Measurements: Goal: Will remain free from infection Outcome: Progressing   Problem: Clinical Measurements: Goal: Respiratory complications will improve Outcome: Progressing

## 2024-05-21 NOTE — Progress Notes (Signed)
 Susan Whitaker   DOB:1971/04/23   FM#:[id number]    ASSESSMENT & PLAN:   Stomach cancer She had received chemotherapy recently and her treatment was complicated by fever and mucositis in the outpatient It is unlikely she will be well enough to receive further chemotherapy this coming week I have canceled her outpatient chemotherapy this week We will continue supportive care only for now  Acquired pancytopenia Neutropenic fever Her labs this morning showed slight improvement of her white count, platelets and hemoglobin Continue IV antibiotics and G-CSF support until ANC is greater than 1.5 Cultures negative so far Likely source is from mucositis  Dehydration, resolving Labs are improving Continue IV fluid support  Severe mucositis pain Continue Magic mouthwash swish and spit along with Dilaudid  IV as needed I warned the patient and family risk of excessive sedation with IV pain medicine, advising them to use it judiciously So far, her pain is well-controlled today  Discharge planning She would likely be here over the next few days  Susan Bedford, MD 05/21/2024 11:23 AM  Subjective:  She is sitting in her bed, family by the bedside.  She had a febrile episode yesterday around the time of her transfusion but have not have any febrile episode this morning  Objective:  Vitals:   05/21/24 0447 05/21/24 1041  BP: 110/68 125/70  Pulse: 71   Resp: 18   Temp: 98.3 F (36.8 C) 98.5 F (36.9 C)  SpO2: 100%      Intake/Output Summary (Last 24 hours) at 05/21/2024 1123 Last data filed at 05/21/2024 1015 Gross per 24 hour  Intake 978.34 ml  Output 2925 ml  Net -1946.66 ml

## 2024-05-21 NOTE — Evaluation (Signed)
 Occupational Therapy Evaluation Patient Details Name: Susan Whitaker MRN: [id number] DOB: 1970-06-23 Today's Date: 05/21/2024   History of Present Illness   54 y.o. female with medical history significant for well-controlled type 2 diabetes, GERD, hypertension, hyperlipidemia, gastric adenocarcinoma on neoadjuvant chemotherapy being admitted to the hospital with neutropenic fever.     Clinical Impressions Pt admitted with the above concerns. Pt currently with functional limitations due to the deficits listed below (see OT Problem List). Prior to admit, pt was living with her daughter completing all ADL's and functional mobility at Mod I level. Pt will benefit from acute skilled OT to increase their safety and independence with ADL and functional mobility for ADL to facilitate discharge. Pt is currently requiring 2 person assist for ADL's and mobility and is greatly limited by fatigue and decreased activity tolerance. Patient will benefit from continued inpatient follow up therapy, <3 hours/day.       If plan is discharge home, recommend the following:   Two people to help with walking and/or transfers;Two people to help with bathing/dressing/bathroom     Functional Status Assessment   Patient has had a recent decline in their functional status and demonstrates the ability to make significant improvements in function in a reasonable and predictable amount of time.     Equipment Recommendations   BSC/3in1;Wheelchair cushion (measurements OT);Wheelchair (measurements OT)      Precautions/Restrictions   Precautions Precautions: Fall Recall of Precautions/Restrictions: Impaired Restrictions Weight Bearing Restrictions Per Provider Order: No     Mobility Bed Mobility Overal bed mobility: Needs Assistance Bed Mobility: Rolling, Sidelying to Sit Rolling: Min assist, Used rails Sidelying to sit: HOB elevated, Mod assist, +2 for physical assistance       General bed  mobility comments: Increased time needed to complete task with cueing for technique and to use rails. Greater assistance required to bring trunk up off HOB. Bed pad used to scoot hips towards EOB.    Transfers Overall transfer level: Needs assistance Equipment used: 2 person hand held assist (2 person face to face technique) Transfers: Sit to/from Stand Sit to Stand: Max assist, +2 safety/equipment, From elevated surface           General transfer comment: Two person face to face technique used to stand at EOB. Left knee blocked initially to prevent buckling. Pt was able to take 2-3 lateral steps to the right towards [facility] prior to sitting.      Balance Overall balance assessment: Needs assistance Sitting-balance support: Bilateral upper extremity supported, Feet supported Sitting balance-Leahy Scale: Poor Sitting balance - Comments: Required intermittent Min A to maintain balance although able to maintain static sitting briefly with close SBA. Unable to tolerate challenge without support.   Standing balance support: Bilateral upper extremity supported, Reliant on assistive device for balance, During functional activity Standing balance-Leahy Scale: Poor Standing balance comment: Required external support to maintain standing balance.       ADL either performed or assessed with clinical judgement   ADL Overall ADL's : Needs assistance/impaired Eating/Feeding: Set up;Bed level   Grooming: Wash/dry face;Minimal assistance;Bed level   Upper Body Bathing: Bed level;Maximal assistance   Lower Body Bathing: Total assistance;Bed level   Upper Body Dressing : Total assistance;Sitting   Lower Body Dressing: Total assistance;Bed level   Toilet Transfer: Maximal assistance;+2 for physical assistance;BSC/3in1;Stand-pivot   Toileting- Clothing Manipulation and Hygiene: Total assistance;Bed level               Vision Baseline Vision/History: 1 Wears  glasses Ability to See in  Adequate Light: 0 Adequate Patient Visual Report: No change from baseline Vision Assessment?: No apparent visual deficits     Perception Perception: Not tested       Praxis Praxis: Not tested       Pertinent Vitals/Pain Pain Assessment Pain Assessment: No/denies pain     Extremity/Trunk Assessment Upper Extremity Assessment Upper Extremity Assessment: Generalized weakness;Right hand dominant   Lower Extremity Assessment Lower Extremity Assessment: Generalized weakness   Cervical / Trunk Assessment Cervical / Trunk Assessment: Kyphotic   Communication Communication Communication: Impaired Factors Affecting Communication: Reduced clarity of speech   Cognition Arousal: Lethargic Behavior During Therapy: Flat affect Cognition: Difficult to assess Difficult to assess due to: Level of arousal    Following commands: Impaired Following commands impaired: Follows one step commands with increased time     Cueing  General Comments   Cueing Techniques: Verbal cues;Tactile cues;Gestural cues  VSS on RA           Home Living Family/patient expects to be discharged to:: Private residence Living Arrangements: Children (daughter) Available Help at Discharge: Family Type of Home: House Home Access: Stairs to enter Secretary/administrator of Steps: 2-3 Entrance Stairs-Rails: None Home Layout: One level     Bathroom Shower/Tub: Producer, Television/film/video: Standard     Home Equipment: Agricultural Consultant (2 wheels);Shower seat          Prior Functioning/Environment Prior Level of Function : Independent/Modified Independent    ADLs Comments: Daughter performs laundry, medication management, housekeeping, and meal prep tasks. Will assist pt on initial days after receiving chemotherapy when feeling weak; otherwise she is independent/Mod I.    OT Problem List: Decreased strength;Decreased coordination;Decreased range of motion;Decreased cognition;Decreased activity  tolerance;Decreased safety awareness;Impaired balance (sitting and/or standing);Decreased knowledge of use of DME or AE;Impaired UE functional use   OT Treatment/Interventions: Self-care/ADL training;Therapeutic exercise;Therapeutic activities;Neuromuscular education;Cognitive remediation/compensation;Patient/family education;DME and/or AE instruction;Balance training;Energy conservation      OT Goals(Current goals can be found in the care plan section)   Acute Rehab OT Goals Patient Stated Goal: none stated OT Goal Formulation: With patient Time For Goal Achievement: 06/04/24 Potential to Achieve Goals: Good   OT Frequency:  Min 2X/week    Co-evaluation PT/OT/SLP Co-Evaluation/Treatment: Yes Reason for Co-Treatment: Complexity of the patient's impairments (multi-system involvement);To address functional/ADL transfers   OT goals addressed during session: ADL's and self-care;Strengthening/ROM      AM-PAC OT 6 Clicks Daily Activity     Outcome Measure Help from another person eating meals?: A Little Help from another person taking care of personal grooming?: A Little Help from another person toileting, which includes using toliet, bedpan, or urinal?: Total Help from another person bathing (including washing, rinsing, drying)?: A Lot Help from another person to put on and taking off regular upper body clothing?: A Lot Help from another person to put on and taking off regular lower body clothing?: Total 6 Click Score: 12   End of Session Equipment Utilized During Treatment: Gait belt Nurse Communication: Mobility status  Activity Tolerance: Patient limited by fatigue Patient left: in bed;with call bell/phone within reach;with family/visitor present  OT Visit Diagnosis: Unsteadiness on feet (R26.81);Muscle weakness (generalized) (M62.81)                Time: 1050-1120 OT Time Calculation (min): 30 min Charges:  OT General Charges $OT Visit: 1 Visit OT Evaluation $OT Eval  Low Complexity: 1 Low  Leita Howell, OTR/L,CBIS  Supplemental OT -  [facility] and [facility] Secure Chat Preferred    Adaria Hole, Leita BIRCH 05/21/2024, 1:47 PM

## 2024-05-21 NOTE — Progress Notes (Addendum)
 " PROGRESS NOTE    Susan Whitaker  FMW:[id number] DOB: 1971/03/03 DOA: 05/18/2024 PCP: Katheen Roselie Rockford, NP    Brief Narrative:54 y.o. female with medical history significant for well-controlled type 2 diabetes, GERD, hypertension, hyperlipidemia, gastric adenocarcinoma on neoadjuvant chemotherapy being admitted to the hospital with neutropenic fever.  History is provided mainly by the patient's 2 daughters were at the bedside, as well as discussion with the patient.  For the last several days the patient has been very weak and lethargic, she had a syncopal episode earlier this week when she was getting out of the shower.  She also has mucositis and it has been having very poor oral intake and some mild nausea.the patient does admit to some dysuria that started a couple of days ago.    Assessment & Plan:   Principal Problem:   Neutropenic fever Active Problems:   Pancytopenia, acquired (HCC)   #1 neutropenic fever/Mucositis -patient admitted with fever s/p recent chemotherapy she was tachycardic tachypneic with lactic acidosis on admission.  Urine culture came back as mixed growth.  Patient was started on empiric antibiotics with vancomycin  cefepime  and Flagyl .  Narrow as able.  WBC, ANC improving.  On colony-stimulating factor till ANC higher than 1.5. T max 101.5 trending down from 102.3 Follow blood cultures continue empiric antibiotics.  No growth so far.  #2 pancytopenia due to recent chemotherapy hemoglobin white count and platelets improving.  No evidence of active bleeding.  A unit of packed RBC was ordered yesterday however during the transfusion patient developed temp of 101.5.  So this was stopped.    #3 history of stage Ib gastric adenocarcinoma status post recent chemotherapy followed by Dr. Lanny complicated by fever and mucositis.  She is status post distal partial gastrostomy in August 2025 and is on adjuvant chemotherapy. Continue miracle  mouthwash Tolerated fruits and  chicken broth today.   #4 hypernatremia resolved secondary to dehydration.  UA with ketones.  Continue IV fluids till she is able to take p.o   #5 history of diet-controlled diabetes CBG (last 3)  Recent Labs    05/20/24 1648 05/20/24 2010 05/21/24 0741  GLUCAP 106* 182* 169*  Will change IV fluids to half-normal saline from D5.  #6 hypokalemia will replete with IV fluids.   Estimated body mass index is 22.65 kg/m as calculated from the following:   Height as of this encounter: 5' 3 (1.6 m).   Weight as of this encounter: 58 kg.  DVT prophylaxis: None due to thrombocytopenia  code Status: Full code Family Communication:dw daughter Disposition Plan:  Status is: Inpatient Remains inpatient appropriate because:    Consultants: acute illness  Procedures: None Antimicrobials: Vanco cefepime  and Flagyl   Subjective:  She had some p.o. intake yesterday but not enough to sustain her continuing IV fluids family in the room Objective: Vitals:   05/20/24 1909 05/20/24 1945 05/21/24 0447 05/21/24 1041  BP: 119/73 110/66 110/68 125/70  Pulse: 79 78 71   Resp: 16 16 18    Temp: (!) 100.9 F (38.3 C) 98.9 F (37.2 C) 98.3 F (36.8 C) 98.5 F (36.9 C)  TempSrc:  Oral  Oral  SpO2: 100% 99% 100%   Weight:      Height:        Intake/Output Summary (Last 24 hours) at 05/21/2024 1127 Last data filed at 05/21/2024 1015 Gross per 24 hour  Intake 978.34 ml  Output 2925 ml  Net -1946.66 ml   Filed Weights   05/18/24 1420  05/19/24 1323  Weight: 58.8 kg 58 kg    Examination:  General exam: Appears weak  Oral mucosa dry with evidence of mucositis Respiratory system: Clear to auscultation. Respiratory effort normal. Cardiovascular system: S1 & S2 heard, RRR. No JVD, murmurs, rubs, gallops or clicks. No pedal edema. Gastrointestinal system: Abdomen is nondistended, soft and nontender. No organomegaly or masses felt. Normal bowel sounds heard. Central nervous system: Alert and  oriented. No focal neurological deficits. Extremities: No edema  Data Reviewed: I have personally reviewed following labs and imaging studies  CBC: Recent Labs  Lab 05/18/24 1528 05/19/24 0822 05/20/24 0320 05/21/24 0310  WBC 0.6* 0.7* 0.7* 1.0*  NEUTROABS 0.0*  --  0.0* 0.1*  HGB 10.2* 8.2* 7.0* 7.2*  HCT 30.3* 24.9* 21.2* 21.8*  MCV 89.4 91.5 91.0 90.8  PLT 22* 44* 26* 30*   Basic Metabolic Panel: Recent Labs  Lab 05/18/24 1444 05/19/24 0822 05/19/24 0850 05/19/24 1827 05/20/24 0320 05/21/24 0310  NA 147* 149*  --  150* 146* 140  K 3.9 2.8*  --  2.9* 3.0* 3.4*  CL 108 116*  --  115* 115* 110  CO2 19* 23  --  23 22 21*  GLUCOSE 192* 156*  --  121* 106* 172*  BUN 32* 21*  --  18 15 11   CREATININE 0.98 0.81  --  0.74 0.71 0.59  CALCIUM  9.4 8.4*  --  8.6* 8.0* 7.8*  MG  --   --  2.3  --  2.2 2.1   GFR: Estimated Creatinine Clearance: 65.7 mL/min (by C-G formula based on SCr of 0.59 mg/dL). Liver Function Tests: Recent Labs  Lab 05/18/24 1444 05/19/24 0822 05/20/24 0320 05/21/24 0310  AST 14* 25 56* 106*  ALT 9 19 46* 82*  ALKPHOS 73 60 47 48  BILITOT 1.5* 1.1 1.0 0.7  PROT 8.5* 6.9 6.1* 5.9*  ALBUMIN  4.4 3.4* 3.0* 2.8*   Recent Labs  Lab 05/18/24 1444  LIPASE 14   Recent Labs  Lab 05/18/24 1444  AMMONIA <13   Coagulation Profile: No results for input(s): INR, PROTIME in the last 168 hours. Cardiac Enzymes: No results for input(s): CKTOTAL, CKMB, CKMBINDEX, TROPONINI in the last 168 hours. BNP (last 3 results) No results for input(s): PROBNP in the last 8760 hours. HbA1C: No results for input(s): HGBA1C in the last 72 hours. CBG: Recent Labs  Lab 05/20/24 0728 05/20/24 1050 05/20/24 1648 05/20/24 2010 05/21/24 0741  GLUCAP 89 92 106* 182* 169*   Lipid Profile: No results for input(s): CHOL, HDL, LDLCALC, TRIG, CHOLHDL, LDLDIRECT in the last 72 hours. Thyroid  Function Tests: No results for input(s): TSH,  T4TOTAL, FREET4, T3FREE, THYROIDAB in the last 72 hours. Anemia Panel: No results for input(s): VITAMINB12, FOLATE, FERRITIN, TIBC, IRON, RETICCTPCT in the last 72 hours. Sepsis Labs: Recent Labs  Lab 05/19/24 9177 05/19/24 1336 05/20/24 1237 05/20/24 1630  LATICACIDVEN 2.4* 2.3* 2.6* 2.5*    Recent Results (from the past 240 hours)  Resp panel by RT-PCR (RSV, Flu A&B, Covid) Anterior Nasal Swab     Status: None   Collection Time: 05/18/24  2:26 PM   Specimen: Anterior Nasal Swab  Result Value Ref Range Status   SARS Coronavirus 2 by RT PCR NEGATIVE NEGATIVE Final    Comment: (NOTE) SARS-CoV-2 target nucleic acids are NOT DETECTED.  The SARS-CoV-2 RNA is generally detectable in upper respiratory specimens during the acute phase of infection. The lowest concentration of SARS-CoV-2 viral copies this assay can detect  is 138 copies/mL. A negative result does not preclude SARS-Cov-2 infection and should not be used as the sole basis for treatment or other patient management decisions. A negative result may occur with  improper specimen collection/handling, submission of specimen other than nasopharyngeal swab, presence of viral mutation(s) within the areas targeted by this assay, and inadequate number of viral copies(<138 copies/mL). A negative result must be combined with clinical observations, patient history, and epidemiological information. The expected result is Negative.  Fact Sheet for Patients:  bloggercourse.com  Fact Sheet for Healthcare Providers:  seriousbroker.it  This test is no t yet approved or cleared by the [place]  FDA and  has been authorized for detection and/or diagnosis of SARS-CoV-2 by FDA under an Emergency Use Authorization (EUA). This EUA will remain  in effect (meaning this test can be used) for the duration of the COVID-19 declaration under Section 564(b)(1) of the Act,  21 U.S.C.section 360bbb-3(b)(1), unless the authorization is terminated  or revoked sooner.       Influenza A by PCR NEGATIVE NEGATIVE Final   Influenza B by PCR NEGATIVE NEGATIVE Final    Comment: (NOTE) The Xpert Xpress SARS-CoV-2/FLU/RSV plus assay is intended as an aid in the diagnosis of influenza from Nasopharyngeal swab specimens and should not be used as a sole basis for treatment. Nasal washings and aspirates are unacceptable for Xpert Xpress SARS-CoV-2/FLU/RSV testing.  Fact Sheet for Patients: bloggercourse.com  Fact Sheet for Healthcare Providers: seriousbroker.it  This test is not yet approved or cleared by the [place]  FDA and has been authorized for detection and/or diagnosis of SARS-CoV-2 by FDA under an Emergency Use Authorization (EUA). This EUA will remain in effect (meaning this test can be used) for the duration of the COVID-19 declaration under Section 564(b)(1) of the Act, 21 U.S.C. section 360bbb-3(b)(1), unless the authorization is terminated or revoked.     Resp Syncytial Virus by PCR NEGATIVE NEGATIVE Final    Comment: (NOTE) Fact Sheet for Patients: bloggercourse.com  Fact Sheet for Healthcare Providers: seriousbroker.it  This test is not yet approved or cleared by the [place]  FDA and has been authorized for detection and/or diagnosis of SARS-CoV-2 by FDA under an Emergency Use Authorization (EUA). This EUA will remain in effect (meaning this test can be used) for the duration of the COVID-19 declaration under Section 564(b)(1) of the Act, 21 U.S.C. section 360bbb-3(b)(1), unless the authorization is terminated or revoked.  Performed at [facility], [address] [place], [place] [address]   Blood culture (routine x 2)     Status: None (Preliminary result)   Collection Time: 05/18/24  2:47 PM   Specimen: BLOOD   Result Value Ref Range Status   Specimen Description   Final    BLOOD LEFT ANTECUBITAL Performed at [facility], [address] [place], [place] [address]    Special Requests   Final    BOTTLES DRAWN AEROBIC AND ANAEROBIC Blood Culture adequate volume Performed at [facility], [address] [place], [place] [address]    Culture   Final    NO GROWTH 3 DAYS Performed at [facility], [address], [place], [place] [address]    Report Status PENDING  Incomplete  Urine Culture (for pregnant, neutropenic or urologic patients or patients with an indwelling urinary catheter)     Status: Abnormal   Collection Time: 05/18/24  3:18 PM   Specimen: In/Out Cath Urine  Result Value  Ref Range Status   Specimen Description   Final    IN/OUT CATH URINE Performed at [facility], [address] [place], [place] [address]    Special Requests   Final    NONE Performed at [facility], [address] [place], [place] [address]    Culture (A)  Final    <10,000 COLONIES/mL INSIGNIFICANT GROWTH Performed at [facility], [address], [place], [place] [address]    Report Status 05/20/2024 FINAL  Final  Culture, blood (Routine X 2) w Reflex to ID Panel     Status: None (Preliminary result)   Collection Time: 05/19/24 10:25 AM   Specimen: BLOOD RIGHT HAND  Result Value Ref Range Status   Specimen Description   Final    BLOOD RIGHT HAND Performed at [facility], [address], [place], [place] [address]    Special Requests   Final    BOTTLES DRAWN AEROBIC ONLY Blood Culture results may not be optimal due to an inadequate volume of blood received in culture bottles Performed at [facility], [address] [place], [place] [address]    Culture   Final    NO GROWTH 2 DAYS Performed at [facility], [address] [place], [place] [address]    Report Status PENDING  Incomplete          Radiology Studies: No results found.   Scheduled Meds:  amLODipine   5 mg Oral Daily   chlorhexidine   10 mL Mouth/Throat QID   Chlorhexidine  Gluconate Cloth  6 each Topical Daily   filgrastim  (NIVESTYM ) SQ  300 mcg Subcutaneous q1800   insulin  aspart  0-5 Units Subcutaneous QHS   insulin  aspart  0-6 Units Subcutaneous TID WC   magic mouthwash w/lidocaine   5 mL Oral QID   sodium chloride  flush  10-40 mL Intracatheter Q12H   Continuous Infusions:  ceFEPime  (MAXIPIME ) IV 2 g (05/21/24 0817)   dextrose  5 % 1,000 mL with potassium chloride  40 mEq infusion 125 mL/hr at 05/21/24 0812   metronidazole  500 mg (05/21/24 1059)   vancomycin  1,000 mg (05/20/24 1911)     LOS: 2 days    Almarie KANDICE Hoots, MD 05/21/2024, 11:27 AM   "

## 2024-05-21 NOTE — Evaluation (Signed)
 Physical Therapy Evaluation Patient Details Name: Susan Whitaker MRN: [id number] DOB: [birth year] Today's Date: 05/21/2024  History of Present Illness  54 y.o. female with medical history significant for well-controlled type 2 diabetes, GERD, hypertension, hyperlipidemia, gastric adenocarcinoma on neoadjuvant chemotherapy being admitted to the hospital with neutropenic fever, weakness  Clinical Impression  On eval, pt required Mod-Max A +2 for mobility. She was able to stand and take lateral steps at bedside with significant assistance. Pt presents with general weakness, decreased activity tolerance, and impaired gait/balance. She is currently at high risk for falls when mobilizing. Pt reported pain in her mouth and along R side of face during session. She was lethargic during session but participated fairly well and put forth good effort with encouragement from staff and family. Prior to admit, pt was living with one of her daughters, completing all ADL's and functional mobility at Mod I level. She was ambulatory without a device. At this time, feel patient could benefit from continued inpatient follow up therapy, <3 hours/day, if patient and family are agreeable. If placement is declined, recommend HHPT, HHOT, and home health aide.       If plan is discharge home, recommend the following: Two people to help with walking and/or transfers;A lot of help with bathing/dressing/bathroom;Assistance with cooking/housework;Assist for transportation;Help with stairs or ramp for entrance;Direct supervision/assist for medications management   Can travel by private vehicle   No    Equipment Recommendations Wheelchair ;Wheelchair cushion ;Hospital bed  Recommendations for Other Services       Functional Status Assessment Patient has had a recent decline in their functional status and demonstrates the ability to make significant improvements in function in a reasonable and predictable amount of time.      Precautions / Restrictions Precautions Precautions: Fall Restrictions Weight Bearing Restrictions Per Provider Order: No      Mobility  Bed Mobility Overal bed mobility: Needs Assistance Bed Mobility: Rolling, Supine to Sit, Sit to Sidelying Rolling: Min assist Sidelying to sit: HOB elevated, Mod assist, +2 for physical assistance     Sit to sidelying: Mod assist, +2 for physical assistance, HOB elevated General bed mobility comments: Increased time needed to complete task with cueing for technique and to use rails. Greater assistance required to bring trunk up off HOB. Bed pad used to scoot hips towards EOB. Assist required for trunk and bil LEs for sit to sidelying as well.    Transfers Overall transfer level: Needs assistance Equipment used: 2 person hand held assist (2 person face-to-face technqiue) Transfers: Sit to/from Stand Sit to Stand: Max assist, +2 safety/equipment, From elevated surface, +2 physical assistance           General transfer comment: Two person face to face technique used to stand at EOB. Knees blocked initially to prevent buckling. Assist to shift weight forward and power up.    Ambulation/Gait Ambulation/Gait assistance: +2 physical assistance, +2 safety/equipment, Mod assist   Assistive device: 2 person hand held assist         General Gait Details: Side steps along bedside with 2 person face-to face-technique (pt holding on to therapists' arms).  Stairs            Wheelchair Mobility     Tilt Bed    Modified Rankin (Stroke Patients Only)       Balance Overall balance assessment: Needs assistance Sitting-balance support: Bilateral upper extremity supported, Feet supported Sitting balance-Leahy Scale: Poor Sitting balance - Comments: Required intermittent Min A to maintain  balance although able to maintain static sitting briefly with close SBA. Unable to tolerate challenge without support. Forward flexed head/neck  posturing.   Standing balance support: Bilateral upper extremity supported, During functional activity Standing balance-Leahy Scale: Poor                               Pertinent Vitals/Pain Pain Assessment Pain Assessment: Faces Faces Pain Scale: Hurts a little bit Pain Location: R side of face, mouth Pain Descriptors / Indicators: Discomfort, Grimacing Pain Intervention(s): Monitored during session, Repositioned    Home Living Family/patient expects to be discharged to:: Private residence Living Arrangements: Children (daughter) Available Help at Discharge: Family Type of Home: House Home Access: Stairs to enter Entrance Stairs-Rails: None Entrance Stairs-Number of Steps: 2-3   Home Layout: One level Home Equipment: Agricultural Consultant (2 wheels);Shower seat      Prior Function Prior Level of Function : Independent/Modified Independent               ADLs Comments: Daughter performs laundry, medication management, housekeeping, and meal prep tasks. Will assist pt on initial days after receiving chemotherapy when feeling weak; otherwise she is independent/Mod I.     Extremity/Trunk Assessment   Upper Extremity Assessment Upper Extremity Assessment: Defer to OT evaluation    Lower Extremity Assessment Lower Extremity Assessment: Generalized weakness (able to weightbear but quite weak bilaterally)    Cervical / Trunk Assessment Cervical / Trunk Assessment: Kyphotic  Communication   Communication Communication: Impaired Factors Affecting Communication: Reduced clarity of speech    Cognition Arousal: Lethargic Behavior During Therapy: Flat affect   PT - Cognitive impairments: Difficult to assess Difficult to assess due to: Level of arousal                       Following commands: Impaired Following commands impaired: Follows one step commands with increased time     Cueing Cueing Techniques: Verbal cues, Tactile cues, Gestural cues      General Comments General comments (skin integrity, edema, etc.): VSS on RA    Exercises     Assessment/Plan    PT Assessment Patient needs continued PT services  PT Problem List Decreased strength;Decreased range of motion;Decreased activity tolerance;Decreased balance;Decreased mobility;Decreased knowledge of use of DME       PT Treatment Interventions DME instruction;Gait training;Functional mobility training;Therapeutic activities;Therapeutic exercise;Patient/family education;Balance training    PT Goals (Current goals can be found in the Care Plan section)  Acute Rehab PT Goals Patient Stated Goal: get better PT Goal Formulation: With patient/family Time For Goal Achievement: 06/04/24 Potential to Achieve Goals: Good    Frequency Min 2X/week     Co-evaluation   Reason for Co-Treatment: Complexity of the patient's impairments (multi-system involvement);To address functional/ADL transfers   OT goals addressed during session: ADL's and self-care;Strengthening/ROM       AM-PAC PT 6 Clicks Mobility  Outcome Measure Help needed turning from your back to your side while in a flat bed without using bedrails?: A Lot Help needed moving from lying on your back to sitting on the side of a flat bed without using bedrails?: A Lot Help needed moving to and from a bed to a chair (including a wheelchair)?: Total Help needed standing up from a chair using your arms (e.g., wheelchair or bedside chair)?: Total Help needed to walk in hospital room?: Total Help needed climbing 3-5 steps with a railing? : Total 6  Click Score: 8    End of Session Equipment Utilized During Treatment: Gait belt Activity Tolerance: Patient limited by fatigue Patient left: in bed;with call bell/phone within reach;with bed alarm set;with family/visitor present   PT Visit Diagnosis: Muscle weakness (generalized) (M62.81);Difficulty in walking, not elsewhere classified (R26.2)    Time: 1050-1120 PT Time  Calculation (min) (ACUTE ONLY): 30 min   Charges:   PT Evaluation $PT Eval Low Complexity: 1 Low   PT General Charges $$ ACUTE PT VISIT: 1 Visit            Dannial SQUIBB, PT Acute Rehabilitation  Office: [phone]

## 2024-05-22 ENCOUNTER — Other Ambulatory Visit: Payer: Self-pay

## 2024-05-22 DIAGNOSIS — R5081 Fever presenting with conditions classified elsewhere: Secondary | ICD-10-CM | POA: Diagnosis not present

## 2024-05-22 DIAGNOSIS — D709 Neutropenia, unspecified: Secondary | ICD-10-CM | POA: Diagnosis not present

## 2024-05-22 LAB — CBC WITH DIFFERENTIAL/PLATELET
Abs Immature Granulocytes: 0.18 K/uL — ABNORMAL HIGH (ref 0.00–0.07)
Basophils Absolute: 0 K/uL (ref 0.0–0.1)
Basophils Relative: 1 %
Eosinophils Absolute: 0 K/uL (ref 0.0–0.5)
Eosinophils Relative: 1 %
HCT: 22.6 % — ABNORMAL LOW (ref 36.0–46.0)
Hemoglobin: 7.5 g/dL — ABNORMAL LOW (ref 12.0–15.0)
Immature Granulocytes: 12 %
Lymphocytes Relative: 61 %
Lymphs Abs: 0.9 K/uL (ref 0.7–4.0)
MCH: 30.1 pg (ref 26.0–34.0)
MCHC: 33.2 g/dL (ref 30.0–36.0)
MCV: 90.8 fL (ref 80.0–100.0)
Monocytes Absolute: 0.1 K/uL (ref 0.1–1.0)
Monocytes Relative: 3 %
Neutro Abs: 0.3 K/uL — CL (ref 1.7–7.7)
Neutrophils Relative %: 22 %
Platelets: 38 K/uL — ABNORMAL LOW (ref 150–400)
RBC: 2.49 MIL/uL — ABNORMAL LOW (ref 3.87–5.11)
RDW: 19.7 % — ABNORMAL HIGH (ref 11.5–15.5)
WBC: 1.5 K/uL — ABNORMAL LOW (ref 4.0–10.5)
nRBC: 0 % (ref 0.0–0.2)

## 2024-05-22 LAB — COMPREHENSIVE METABOLIC PANEL WITH GFR
ALT: 59 U/L — ABNORMAL HIGH (ref 0–44)
AST: 78 U/L — ABNORMAL HIGH (ref 15–41)
Albumin: 2.9 g/dL — ABNORMAL LOW (ref 3.5–5.0)
Alkaline Phosphatase: 52 U/L (ref 38–126)
Anion gap: 13 (ref 5–15)
BUN: 7 mg/dL (ref 6–20)
CO2: 19 mmol/L — ABNORMAL LOW (ref 22–32)
Calcium: 8.3 mg/dL — ABNORMAL LOW (ref 8.9–10.3)
Chloride: 108 mmol/L (ref 98–111)
Creatinine, Ser: 0.52 mg/dL (ref 0.44–1.00)
GFR, Estimated: >60 mL/min
Glucose, Bld: 65 mg/dL — ABNORMAL LOW (ref 70–99)
Potassium: 3.4 mmol/L — ABNORMAL LOW (ref 3.5–5.1)
Sodium: 140 mmol/L (ref 135–145)
Total Bilirubin: 0.9 mg/dL (ref 0.0–1.2)
Total Protein: 6.4 g/dL — ABNORMAL LOW (ref 6.5–8.1)

## 2024-05-22 LAB — GLUCOSE, CAPILLARY
Glucose-Capillary: 102 mg/dL — ABNORMAL HIGH (ref 70–99)
Glucose-Capillary: 57 mg/dL — ABNORMAL LOW (ref 70–99)
Glucose-Capillary: 82 mg/dL (ref 70–99)
Glucose-Capillary: 106 mg/dL — ABNORMAL HIGH (ref 70–99)
Glucose-Capillary: 114 mg/dL — ABNORMAL HIGH (ref 70–99)

## 2024-05-22 MED ORDER — DEXTROSE 50 % IV SOLN
INTRAVENOUS | Status: AC
  Administered 2024-05-22: 12.5 mL
  Filled 2024-05-22: qty 50

## 2024-05-22 MED ORDER — KCL IN DEXTROSE-NACL 40-5-0.9 MEQ/L-%-% IV SOLN
INTRAVENOUS | Status: AC
  Filled 2024-05-22: qty 1000

## 2024-05-22 MED ORDER — DEXTROSE 50 % IV SOLN: 12.5000 g | INTRAVENOUS | Status: AC

## 2024-05-22 NOTE — Progress Notes (Signed)
 " PROGRESS NOTE    Susan Whitaker  FMW:[id number] DOB: 03/16/1971 DOA: 05/18/2024 PCP: Katheen Roselie Rockford, NP    Brief Narrative:54 y.o. female with medical history significant for well-controlled type 2 diabetes, GERD, hypertension, hyperlipidemia, gastric adenocarcinoma on neoadjuvant chemotherapy being admitted to the hospital with neutropenic fever.  History is provided mainly by the patient's 2 daughters were at the bedside, as well as discussion with the patient.  For the last several days the patient has been very weak and lethargic, she had a syncopal episode earlier this week when she was getting out of the shower.  She also has mucositis and it has been having very poor oral intake and some mild nausea.the patient does admit to some dysuria that started a couple of days ago.    Assessment & Plan:   Principal Problem:   Neutropenic fever Active Problems:   Pancytopenia, acquired (HCC)   #1 neutropenic fever/Mucositis -patient admitted with fever s/p recent chemotherapy she was tachycardic tachypneic with lactic acidosis on admission.  Urine culture came back as mixed growth.  Patient was started on empiric antibiotics with vancomycin  cefepime  and Flagyl .  Narrow as able.  WBC, ANC improving.  On colony-stimulating factor till ANC higher than 1.5. T max 101.5 trending down from 102.3 No further fevers Blood cultures no growth Continue cefepime   #2 pancytopenia due to recent chemotherapy hemoglobin white count and platelets improving.  No evidence of active bleeding.  A unit of packed RBC was ordered  however during the transfusion patient developed temp of 101.5.  So this was stopped.    #3 history of stage Ib gastric adenocarcinoma status post recent chemotherapy followed by Dr. Lanny complicated by fever and mucositis.  She is status post distal partial gastrostomy in August 2025 and is on adjuvant chemotherapy. Continue miracle  mouthwash Tolerated fruits and chicken broth    #4  hypernatremia resolved secondary to dehydration.  UA with ketones.  Continue IV fluids till she is able to take p.o   #5 history of diet-controlled diabetes CBG (last 3)  Recent Labs    05/22/24 0736 05/22/24 0804 05/22/24 1128  GLUCAP 57* 102* 82  Continue IV fluids with D5  #6 hypokalemia will replete with IV fluids.   Estimated body mass index is 22.65 kg/m as calculated from the following:   Height as of this encounter: 5' 3 (1.6 m).   Weight as of this encounter: 58 kg.  DVT prophylaxis: None due to thrombocytopenia  code Status: Full code Family Communication:dw daughter Disposition Plan:  Status is: Inpatient Remains inpatient appropriate because:    Consultants: acute illness  Procedures: None Antimicrobials: Cefepime   Subjective: She had a bowel movement denies diarrhea still not able to eat regular food remains on broth and fruit  objective: Vitals:   05/21/24 1500 05/21/24 2033 05/22/24 0449 05/22/24 1022  BP: 118/68 130/70 123/65 125/68  Pulse: 76 80 78   Resp: 18 16 16    Temp: 99 F (37.2 C) 98.9 F (37.2 C) 98.9 F (37.2 C)   TempSrc: Oral Oral    SpO2: 100% 100% 100%   Weight:      Height:        Intake/Output Summary (Last 24 hours) at 05/22/2024 1304 Last data filed at 05/22/2024 0400 Gross per 24 hour  Intake 2638.19 ml  Output 2000 ml  Net 638.19 ml   Filed Weights   05/18/24 1420 05/19/24 1323  Weight: 58.8 kg 58 kg    Examination:  General exam: Appears weak  Oral mucosa dry with evidence of mucositis Respiratory system: Clear to auscultation. Respiratory effort normal. Cardiovascular system: S1 & S2 heard, RRR. No JVD, murmurs, rubs, gallops or clicks. No pedal edema. Gastrointestinal system: Abdomen is nondistended, soft and nontender. No organomegaly or masses felt. Normal bowel sounds heard. Central nervous system: Alert and oriented. No focal neurological deficits. Extremities: No edema  Data Reviewed: I have personally  reviewed following labs and imaging studies  CBC: Recent Labs  Lab 05/18/24 1528 05/19/24 0822 05/20/24 0320 05/21/24 0310 05/22/24 0512  WBC 0.6* 0.7* 0.7* 1.0* 1.5*  NEUTROABS 0.0*  --  0.0* 0.1* 0.3*  HGB 10.2* 8.2* 7.0* 7.2* 7.5*  HCT 30.3* 24.9* 21.2* 21.8* 22.6*  MCV 89.4 91.5 91.0 90.8 90.8  PLT 22* 44* 26* 30* 38*   Basic Metabolic Panel: Recent Labs  Lab 05/19/24 0822 05/19/24 0850 05/19/24 1827 05/20/24 0320 05/21/24 0310 05/22/24 0512  NA 149*  --  150* 146* 140 140  K 2.8*  --  2.9* 3.0* 3.4* 3.4*  CL 116*  --  115* 115* 110 108  CO2 23  --  23 22 21* 19*  GLUCOSE 156*  --  121* 106* 172* 65*  BUN 21*  --  18 15 11 7   CREATININE 0.81  --  0.74 0.71 0.59 0.52  CALCIUM  8.4*  --  8.6* 8.0* 7.8* 8.3*  MG  --  2.3  --  2.2 2.1  --    GFR: Estimated Creatinine Clearance: 65.7 mL/min (by C-G formula based on SCr of 0.52 mg/dL). Liver Function Tests: Recent Labs  Lab 05/18/24 1444 05/19/24 0822 05/20/24 0320 05/21/24 0310 05/22/24 0512  AST 14* 25 56* 106* 78*  ALT 9 19 46* 82* 59*  ALKPHOS 73 60 47 48 52  BILITOT 1.5* 1.1 1.0 0.7 0.9  PROT 8.5* 6.9 6.1* 5.9* 6.4*  ALBUMIN  4.4 3.4* 3.0* 2.8* 2.9*   Recent Labs  Lab 05/18/24 1444  LIPASE 14   Recent Labs  Lab 05/18/24 1444  AMMONIA <13   Coagulation Profile: No results for input(s): INR, PROTIME in the last 168 hours. Cardiac Enzymes: No results for input(s): CKTOTAL, CKMB, CKMBINDEX, TROPONINI in the last 168 hours. BNP (last 3 results) No results for input(s): PROBNP in the last 8760 hours. HbA1C: No results for input(s): HGBA1C in the last 72 hours. CBG: Recent Labs  Lab 05/21/24 2031 05/21/24 2125 05/22/24 0736 05/22/24 0804 05/22/24 1128  GLUCAP 80 79 57* 102* 82   Lipid Profile: No results for input(s): CHOL, HDL, LDLCALC, TRIG, CHOLHDL, LDLDIRECT in the last 72 hours. Thyroid  Function Tests: No results for input(s): TSH, T4TOTAL, FREET4,  T3FREE, THYROIDAB in the last 72 hours. Anemia Panel: No results for input(s): VITAMINB12, FOLATE, FERRITIN, TIBC, IRON, RETICCTPCT in the last 72 hours. Sepsis Labs: Recent Labs  Lab 05/19/24 9177 05/19/24 1336 05/20/24 1237 05/20/24 1630  LATICACIDVEN 2.4* 2.3* 2.6* 2.5*    Recent Results (from the past 240 hours)  Resp panel by RT-PCR (RSV, Flu A&B, Covid) Anterior Nasal Swab     Status: None   Collection Time: 05/18/24  2:26 PM   Specimen: Anterior Nasal Swab  Result Value Ref Range Status   SARS Coronavirus 2 by RT PCR NEGATIVE NEGATIVE Final    Comment: (NOTE) SARS-CoV-2 target nucleic acids are NOT DETECTED.  The SARS-CoV-2 RNA is generally detectable in upper respiratory specimens during the acute phase of infection. The lowest concentration of SARS-CoV-2 viral copies this  assay can detect is 138 copies/mL. A negative result does not preclude SARS-Cov-2 infection and should not be used as the sole basis for treatment or other patient management decisions. A negative result may occur with  improper specimen collection/handling, submission of specimen other than nasopharyngeal swab, presence of viral mutation(s) within the areas targeted by this assay, and inadequate number of viral copies(<138 copies/mL). A negative result must be combined with clinical observations, patient history, and epidemiological information. The expected result is Negative.  Fact Sheet for Patients:  bloggercourse.com  Fact Sheet for Healthcare Providers:  seriousbroker.it  This test is no t yet approved or cleared by the [place]  FDA and  has been authorized for detection and/or diagnosis of SARS-CoV-2 by FDA under an Emergency Use Authorization (EUA). This EUA will remain  in effect (meaning this test can be used) for the duration of the COVID-19 declaration under Section 564(b)(1) of the Act, 21 U.S.C.section  360bbb-3(b)(1), unless the authorization is terminated  or revoked sooner.       Influenza A by PCR NEGATIVE NEGATIVE Final   Influenza B by PCR NEGATIVE NEGATIVE Final    Comment: (NOTE) The Xpert Xpress SARS-CoV-2/FLU/RSV plus assay is intended as an aid in the diagnosis of influenza from Nasopharyngeal swab specimens and should not be used as a sole basis for treatment. Nasal washings and aspirates are unacceptable for Xpert Xpress SARS-CoV-2/FLU/RSV testing.  Fact Sheet for Patients: bloggercourse.com  Fact Sheet for Healthcare Providers: seriousbroker.it  This test is not yet approved or cleared by the [place]  FDA and has been authorized for detection and/or diagnosis of SARS-CoV-2 by FDA under an Emergency Use Authorization (EUA). This EUA will remain in effect (meaning this test can be used) for the duration of the COVID-19 declaration under Section 564(b)(1) of the Act, 21 U.S.C. section 360bbb-3(b)(1), unless the authorization is terminated or revoked.     Resp Syncytial Virus by PCR NEGATIVE NEGATIVE Final    Comment: (NOTE) Fact Sheet for Patients: bloggercourse.com  Fact Sheet for Healthcare Providers: seriousbroker.it  This test is not yet approved or cleared by the [place]  FDA and has been authorized for detection and/or diagnosis of SARS-CoV-2 by FDA under an Emergency Use Authorization (EUA). This EUA will remain in effect (meaning this test can be used) for the duration of the COVID-19 declaration under Section 564(b)(1) of the Act, 21 U.S.C. section 360bbb-3(b)(1), unless the authorization is terminated or revoked.  Performed at [facility], [address] [place], [place] [address]   Blood culture (routine x 2)     Status: None (Preliminary result)   Collection Time: 05/18/24  2:47 PM   Specimen: BLOOD  Result Value Ref Range  Status   Specimen Description   Final    BLOOD LEFT ANTECUBITAL Performed at [facility], [address] [place], [place] [address]    Special Requests   Final    BOTTLES DRAWN AEROBIC AND ANAEROBIC Blood Culture adequate volume Performed at [facility], [address] [place], [place] [address]    Culture   Final    NO GROWTH 4 DAYS Performed at [facility], [address], [place], [place] [address]    Report Status PENDING  Incomplete  Urine Culture (for pregnant, neutropenic or urologic patients or patients with an indwelling urinary catheter)     Status: Abnormal   Collection Time: 05/18/24  3:18 PM   Specimen: In/Out Cath Urine  Result Value Ref Range Status   Specimen Description   Final    IN/OUT CATH URINE Performed at [facility], [address] [place], [place] [address]    Special Requests   Final    NONE Performed at [facility], [address] [place], [place] [address]    Culture (A)  Final    <10,000 COLONIES/mL INSIGNIFICANT GROWTH Performed at [facility], [address] [place], [place] [address]    Report Status 05/20/2024 FINAL  Final  Culture, blood (Routine X 2) w Reflex to ID Panel     Status: None (Preliminary result)   Collection Time: 05/19/24 10:25 AM   Specimen: BLOOD RIGHT HAND  Result Value Ref Range Status   Specimen Description   Final    BLOOD RIGHT HAND Performed at [facility], [address] [place], [place] [address]    Special Requests   Final    BOTTLES DRAWN AEROBIC ONLY Blood Culture results may not be optimal due to an inadequate volume of blood received in culture bottles Performed at [facility], [address] [place], [place] [address]    Culture   Final    NO GROWTH 3 DAYS Performed at [facility], [address], [place], [place] [address]    Report Status PENDING  Incomplete         Radiology Studies: No results  found.   Scheduled Meds:  amLODipine   5 mg Oral Daily   chlorhexidine   10 mL Mouth/Throat QID   Chlorhexidine  Gluconate Cloth  6 each Topical Daily   filgrastim  (NIVESTYM ) SQ  300 mcg Subcutaneous q1800   insulin  aspart  0-5 Units Subcutaneous QHS   insulin  aspart  0-6 Units Subcutaneous TID WC   magic mouthwash w/lidocaine   5 mL Oral QID   sodium chloride  flush  10-40 mL Intracatheter Q12H   Continuous Infusions:  ceFEPime  (MAXIPIME ) IV 2 g (05/22/24 0754)   dextrose  5 % and 0.9 % NaCl with KCl 40 mEq/L 40 mL/hr at 05/22/24 0829     LOS: 3 days    Susan KANDICE Hoots, MD 05/22/2024, 1:04 PM   "

## 2024-05-22 NOTE — [place] FL2 (Signed)
 " [place]  MEDICAID FL2 LEVEL OF CARE FORM     IDENTIFICATION  Patient Name: Susan Whitaker Birthdate: 05-Sep-1970 Sex: female Admission Date (Current Location): 05/18/2024  [facility] and [place] Number:  Producer, Television/film/video and Address:  [facility],  [address], [place] [address]      Provider Number: [id number]  Attending Physician Name and Address:  Will Almarie MATSU, MD  Relative Name and Phone Number:  Ashland Osmer [phone]    Current Level of Care: Hospital Recommended Level of Care: Skilled Nursing Facility Prior Approval Number:    Date Approved/Denied:   PASRR Number: [id number]  Discharge Plan: SNF    Current Diagnoses: Patient Active Problem List   Diagnosis Date Noted   Pancytopenia, acquired (HCC) 05/18/2024   Neutropenic fever 05/18/2024   Moderate protein-calorie malnutrition 03/19/2024   Dehydration 02/29/2024   Primary adenocarcinoma of pyloric antrum (HCC) 01/11/2024   Neuroendocrine neoplasm of stomach (HCC) 12/26/2023   Gastric nodule 12/26/2023   Gastric mass 12/26/2023   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   Gastric adenocarcinoma (HCC) 11/30/2023   Right arm pain 09/14/2023   Chronic right-sided low back pain without sciatica 08/30/2023   FH: breast cancer in first degree relative 01/05/2021   Chronic abdominal pain 10/04/2020   S/P radiofrequency ablation operation for arrhythmia 10/03/20 10/04/2020   Chronic idiopathic constipation 09/14/2020   Obesity (BMI 30-39.9) 09/14/2020   Chronic narcotic use 09/14/2020   IUD (intrauterine device) in place 09/14/2020   Endometriosis of uterus 01/08/2020   History of small bowel obstruction 06/02/2019   Vitamin D  deficiency 05/26/2019   Anxiety state 07/14/2012   Primary hyperaldosteronism 02/21/2012   Multiple thyroid  nodules 01/04/2012   Cervical pain (neck) 05/28/2011   Hemorrhoids 02/07/2009   Type 2 diabetes mellitus (HCC) 02/07/2009   ALLERGIC RHINITIS  05/24/2008   Hyperlipidemia associated with type 2 diabetes mellitus (HCC) 04/22/2008   URINARY INCONTINENCE 04/22/2008   Essential thrombocythemia (HCC) 04/22/2008   HTN (hypertension) 01/04/2007   GERD 01/04/2007    Orientation RESPIRATION BLADDER Height & Weight     Self, Time, Situation, Place  Normal Continent Weight: 58 kg Height:  5' 3 (160 cm)  BEHAVIORAL SYMPTOMS/MOOD NEUROLOGICAL BOWEL NUTRITION STATUS      Continent Diet (CHO MOD)  AMBULATORY STATUS COMMUNICATION OF NEEDS Skin   Limited Assist Verbally Normal                       Personal Care Assistance Level of Assistance  Bathing, Feeding, Dressing Bathing Assistance: Limited assistance Feeding assistance: Limited assistance Dressing Assistance: Limited assistance     Functional Limitations Info  Sight, Hearing, Speech Sight Info: Impaired (eyeglasses) Hearing Info: Adequate Speech Info: Impaired (Dentures-top)    SPECIAL CARE FACTORS FREQUENCY  PT (By licensed PT), OT (By licensed OT)     PT Frequency: 5x week OT Frequency: 5x week            Contractures Contractures Info: Not present    Additional Factors Info  Code Status, Allergies Code Status Info: Full Allergies Info: Lisinopril           Current Medications (05/22/2024):  This is the current hospital active medication list Current Facility-Administered Medications  Medication Dose Route Frequency Provider Last Rate Last Admin   acetaminophen  (TYLENOL ) tablet 650 mg  650 mg Oral Q6H PRN Sundil, Subrina, MD   650 mg at 05/21/24 1046   albuterol  (PROVENTIL ) (2.5 MG/3ML) 0.083% nebulizer solution 2.5  mg  2.5 mg Nebulization Q2H PRN Zella, Mir M, MD       amLODipine  (NORVASC ) tablet 5 mg  5 mg Oral Daily Zella, Mir M, MD   5 mg at 05/22/24 1022   antiseptic oral rinse (BIOTENE) solution 15 mL  15 mL Mouth Rinse PRN Sundil, Subrina, MD       ceFEPIme  (MAXIPIME ) 2 g in sodium chloride  0.9 % 100 mL IVPB  2 g Intravenous Q8H  Seabron Ronal HERO, RPH 200 mL/hr at 05/22/24 1626 2 g at 05/22/24 1626   chlorhexidine  (PERIDEX ) 0.12 % solution 10 mL  10 mL Mouth/Throat QID Will Almarie MATSU, MD   10 mL at 05/22/24 1328   Chlorhexidine  Gluconate Cloth 2 % PADS 6 each  6 each Topical Daily Lanny Callander, MD   6 each at 05/22/24 1023   dextrose  5 % and 0.9 % NaCl with KCl 40 mEq/L infusion   Intravenous Continuous Will Almarie MATSU, MD 40 mL/hr at 05/22/24 0829 New Bag at 05/22/24 0829   filgrastim -aafi (NIVESTYM ) injection 300 mcg  300 mcg Subcutaneous q1800 Lonn Hicks, MD   300 mcg at 05/21/24 1736   HYDROmorphone  (DILAUDID ) injection 0.5 mg  0.5 mg Intravenous Q2H PRN Lonn Hicks, MD       insulin  aspart (novoLOG ) injection 0-5 Units  0-5 Units Subcutaneous QHS Sundil, Subrina, MD       insulin  aspart (novoLOG ) injection 0-6 Units  0-6 Units Subcutaneous TID WC Sundil, Subrina, MD   1 Units at 05/21/24 1241   lip balm (CARMEX) ointment   Topical PRN Will Almarie MATSU, MD       magic mouthwash w/lidocaine   5 mL Oral QID Lonn, Ni, MD   5 mL at 05/22/24 1023   ondansetron  (ZOFRAN ) tablet 4 mg  4 mg Oral Q6H PRN Zella Katha HERO, MD       Or   ondansetron  (ZOFRAN ) injection 4 mg  4 mg Intravenous Q6H PRN Zella, Mir M, MD   4 mg at 05/22/24 9170   sodium chloride  flush (NS) 0.9 % injection 10-40 mL  10-40 mL Intracatheter Q12H Lanny Callander, MD   10 mL at 05/20/24 0935   traZODone  (DESYREL ) tablet 25 mg  25 mg Oral QHS PRN Zella Katha HERO, MD         Discharge Medications: Please see discharge summary for a list of discharge medications.  Relevant Imaging Results:  Relevant Lab Results:   Additional Information ss#[id number] Nathanel, RN     "

## 2024-05-22 NOTE — Progress Notes (Signed)
 Susan Whitaker   DOB:January 09, 1971   FM#:[id number]    ASSESSMENT & PLAN:   Stomach cancer She had received chemotherapy recently and her treatment was complicated by fever and mucositis in the outpatient I have canceled her outpatient chemotherapy this week We will continue supportive care only for now  Acquired pancytopenia Neutropenic fever Her labs this morning showed slight improvement of her white count, platelets and hemoglobin Continue IV antibiotics and G-CSF support until ANC is greater than 1.5 Cultures negative so far Likely source is from mucositis  Dehydration, resolving Labs are improving Continue IV fluid support  Severe mucositis pain Continue Magic mouthwash swish and spit along with Dilaudid  IV as needed I warned the patient and family risk of excessive sedation with IV pain medicine, advising them to use it judiciously So far, her pain is well-controlled today  Discharge planning She would likely be here over the next few days She is aware that I will not be here tomorrow but will resume her care on Thursday  [name], MD 05/22/2024 11:11 AM  Subjective:  The patient has suction in her mouth when I walked in.  Daughter by the bedside. She has been afebrile recently.  Mucositis is about the same.  Blood counts are improving and she is tolerating a little bit more diet  Objective:  Vitals:   05/22/24 0449 05/22/24 1022  BP: 123/65 125/68  Pulse: 78   Resp: 16   Temp: 98.9 F (37.2 C)   SpO2: 100%      Intake/Output Summary (Last 24 hours) at 05/22/2024 1111 Last data filed at 05/22/2024 0400 Gross per 24 hour  Intake 2638.19 ml  Output 2000 ml  Net 638.19 ml

## 2024-05-22 NOTE — TOC Initial Note (Signed)
 Transition of Care [facility]) - Initial/Assessment Note    Patient Details  Name: Susan Whitaker MRN: [id number] Date of Birth: 1970-08-03  [facility]) CM/SW Contact:    Bascom Service, RN Phone Number: 05/22/2024, 5:11 PM  Clinical Narrative:  PT recc ST SNF-per permission patient/dtr Emmanuel to fax out await bed offers,choice,then facility to get auth.                 Expected Discharge Plan: Skilled Nursing Facility Barriers to Discharge: Continued Medical Work up   Patient Goals and CMS Choice Patient states their goals for this hospitalization and ongoing recovery are:: Rehab   Choice offered to / list presented to : Patient      Expected Discharge Plan and Services                                              Prior Living Arrangements/Services                       Activities of Daily Living   ADL Screening (condition at time of admission) Independently performs ADLs?: No Does the patient have a NEW difficulty with bathing/dressing/toileting/self-feeding that is expected to last >3 days?: Yes (Initiates electronic notice to provider for possible OT consult) Does the patient have a NEW difficulty with getting in/out of bed, walking, or climbing stairs that is expected to last >3 days?: Yes (Initiates electronic notice to provider for possible PT consult) Does the patient have a NEW difficulty with communication that is expected to last >3 days?: No Is the patient deaf or have difficulty hearing?: No Does the patient have difficulty seeing, even when wearing glasses/contacts?: No Does the patient have difficulty concentrating, remembering, or making decisions?: No  Permission Sought/Granted                  Emotional Assessment              Admission diagnosis:  Pure hypercholesterolemia [E78.00] Dehydration [E86.0] Hypernatremia [E87.0] Primary hypertension [I10] Thrombocytopenia [D69.6] Mucositis  [K12.30] Pancytopenia, acquired (HCC) [D61.818] Chemotherapy-induced neutropenia [D70.1, T45.1X5A] SIRS (systemic inflammatory response syndrome) (HCC) [R65.10] Neutropenic fever [D70.9, R50.81] Patient Active Problem List   Diagnosis Date Noted   Pancytopenia, acquired (HCC) 05/18/2024   Neutropenic fever 05/18/2024   Moderate protein-calorie malnutrition 03/19/2024   Dehydration 02/29/2024   Primary adenocarcinoma of pyloric antrum (HCC) 01/11/2024   Neuroendocrine neoplasm of stomach (HCC) 12/26/2023   Gastric nodule 12/26/2023   Gastric mass 12/26/2023   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   Gastric adenocarcinoma (HCC) 11/30/2023   Right arm pain 09/14/2023   Chronic right-sided low back pain without sciatica 08/30/2023   FH: breast cancer in first degree relative 01/05/2021   Chronic abdominal pain 10/04/2020   S/P radiofrequency ablation operation for arrhythmia 10/03/20 10/04/2020   Chronic idiopathic constipation 09/14/2020   Obesity (BMI 30-39.9) 09/14/2020   Chronic narcotic use 09/14/2020   IUD (intrauterine device) in place 09/14/2020   Endometriosis of uterus 01/08/2020   History of small bowel obstruction 06/02/2019   Vitamin D  deficiency 05/26/2019   Anxiety state 07/14/2012   Primary hyperaldosteronism 02/21/2012   Multiple thyroid  nodules 01/04/2012   Cervical pain (neck) 05/28/2011   Hemorrhoids 02/07/2009   Type 2 diabetes mellitus (HCC) 02/07/2009   ALLERGIC RHINITIS 05/24/2008   Hyperlipidemia associated with type 2  diabetes mellitus (HCC) 04/22/2008   URINARY INCONTINENCE 04/22/2008   Essential thrombocythemia (HCC) 04/22/2008   HTN (hypertension) 01/04/2007   GERD 01/04/2007   PCP:  Katheen Roselie Rockford, NP Pharmacy:   [facility] [place] [facility] [address] [place] [address] Phone: [phone] Fax: [phone]  [place] [facility] [address] [place]  [address] Phone: [phone] Fax: [phone]  [facility] [place], [place] - [address] [place] [address] Phone: [phone] Fax: [phone]  [facility] [address] [place] [address] Phone: [phone] Fax: [phone]     Social Drivers of Health (SDOH) Social History: SDOH Screenings   Food Insecurity: No Food Insecurity (05/19/2024)  Housing: Low Risk (05/19/2024)  Transportation Needs: No Transportation Needs (05/19/2024)  Utilities: Not At Risk (05/19/2024)  Depression (PHQ2-9): Low Risk (04/24/2024)  Tobacco Use: Low Risk (05/18/2024)   SDOH Interventions:     Readmission Risk Interventions     No data to display

## 2024-05-22 NOTE — Progress Notes (Signed)
 Hypoglycemic Event  CBG: 57  Treatment: D50 25 mL (12.5 gm)  Symptoms: None  Follow-up CBG: Time:0804 CBG Result:102  Possible Reasons for Event: Inadequate meal intake  Comments/MD notified:Dr. Will Albino, Delon Lever

## 2024-05-23 ENCOUNTER — Other Ambulatory Visit: Payer: Self-pay

## 2024-05-23 DIAGNOSIS — D709 Neutropenia, unspecified: Secondary | ICD-10-CM | POA: Diagnosis not present

## 2024-05-23 DIAGNOSIS — R5081 Fever presenting with conditions classified elsewhere: Secondary | ICD-10-CM | POA: Diagnosis not present

## 2024-05-23 LAB — COMPREHENSIVE METABOLIC PANEL WITH GFR
ALT: 48 U/L — ABNORMAL HIGH (ref 0–44)
AST: 70 U/L — ABNORMAL HIGH (ref 15–41)
Albumin: 3.1 g/dL — ABNORMAL LOW (ref 3.5–5.0)
Alkaline Phosphatase: 57 U/L (ref 38–126)
Anion gap: 13 (ref 5–15)
BUN: 9 mg/dL (ref 6–20)
CO2: 21 mmol/L — ABNORMAL LOW (ref 22–32)
Calcium: 8.4 mg/dL — ABNORMAL LOW (ref 8.9–10.3)
Chloride: 110 mmol/L (ref 98–111)
Creatinine, Ser: 0.53 mg/dL (ref 0.44–1.00)
GFR, Estimated: >60 mL/min
Glucose, Bld: 112 mg/dL — ABNORMAL HIGH (ref 70–99)
Potassium: 3.3 mmol/L — ABNORMAL LOW (ref 3.5–5.1)
Sodium: 143 mmol/L (ref 135–145)
Total Bilirubin: 1 mg/dL (ref 0.0–1.2)
Total Protein: 6.7 g/dL (ref 6.5–8.1)

## 2024-05-23 LAB — CBC WITH DIFFERENTIAL/PLATELET
Basophils Absolute: 0 K/uL (ref 0.0–0.1)
Basophils Relative: 0 %
Eosinophils Absolute: 0 K/uL (ref 0.0–0.5)
Eosinophils Relative: 1 %
HCT: 23 % — ABNORMAL LOW (ref 36.0–46.0)
Hemoglobin: 7.7 g/dL — ABNORMAL LOW (ref 12.0–15.0)
Lymphocytes Relative: 71 %
Lymphs Abs: 2.1 K/uL (ref 0.7–4.0)
MCH: 30.1 pg (ref 26.0–34.0)
MCHC: 33.5 g/dL (ref 30.0–36.0)
MCV: 89.8 fL (ref 80.0–100.0)
Monocytes Absolute: 0 K/uL — ABNORMAL LOW (ref 0.1–1.0)
Monocytes Relative: 1 %
Neutro Abs: 0.8 K/uL — ABNORMAL LOW (ref 1.7–7.7)
Neutrophils Relative %: 27 %
Platelets: 48 K/uL — ABNORMAL LOW (ref 150–400)
RBC: 2.56 MIL/uL — ABNORMAL LOW (ref 3.87–5.11)
RDW: 20.2 % — ABNORMAL HIGH (ref 11.5–15.5)
WBC: 3 K/uL — ABNORMAL LOW (ref 4.0–10.5)
nRBC: 0 % (ref 0.0–0.2)

## 2024-05-23 LAB — CULTURE, BLOOD (ROUTINE X 2)
Culture: NO GROWTH
Special Requests: ADEQUATE

## 2024-05-23 LAB — GLUCOSE, CAPILLARY
Glucose-Capillary: 118 mg/dL — ABNORMAL HIGH (ref 70–99)
Glucose-Capillary: 141 mg/dL — ABNORMAL HIGH (ref 70–99)
Glucose-Capillary: 131 mg/dL — ABNORMAL HIGH (ref 70–99)
Glucose-Capillary: 130 mg/dL — ABNORMAL HIGH (ref 70–99)

## 2024-05-23 MED ORDER — BOOST / RESOURCE BREEZE PO LIQD CUSTOM
1.0000 | Freq: Three times a day (TID) | ORAL | Status: DC
  Administered 2024-05-23 – 2024-05-28 (×6): 1 via ORAL

## 2024-05-23 MED ORDER — KCL IN DEXTROSE-NACL 40-5-0.9 MEQ/L-%-% IV SOLN
INTRAVENOUS | Status: DC
  Filled 2024-05-23: qty 1000

## 2024-05-23 MED ORDER — POTASSIUM CHLORIDE CRYS ER 20 MEQ PO TBCR
40.0000 meq | EXTENDED_RELEASE_TABLET | Freq: Once | ORAL | Status: AC
  Administered 2024-05-23: 40 meq via ORAL
  Filled 2024-05-23: qty 2

## 2024-05-23 NOTE — Progress Notes (Addendum)
 PT Cancellation Note  Patient Details Name: Susan Whitaker MRN: [id number] DOB: 16-Feb-1971   Cancelled Treatment:    Reason Eval/Treat Not Completed:  Attempted PT tx session-pt/family politely declined participation on today. Discussed d/c plan-family reports pt will return home with family providing needed care. 3n1 and WC have been recommended-pt/family are in agreement with this. Pt/family prefer home health f/u instead of SNF placement. Will continue to make attempts to work with patient as schedule allows.     Dannial SQUIBB, PT Acute Rehabilitation  Office: [phone]

## 2024-05-23 NOTE — TOC Progression Note (Addendum)
 Transition of Care [facility]) - Progression Note    Patient Details  Name: Susan Whitaker MRN: [id number] Date of Birth: 03/17/1971  Transition of Care [facility]) CM/SW Contact  Gissela Bloch, Nathanel, RN Phone Number: 05/23/2024, 10:17 AM  Clinical Narrative: Will give bed offers,await choice,then auth by facility. Service Provider Request Status Services Address Phone Fax Patient Preferred  [facility]  Accepted -- [address] [place] [address] [phone] --  [facility]  Accepted -- [address], [place] [address] [phone]        12p-spoke to patient/dtr [name] will discuss & let me know if ST SNF vs HHC-HHPT/OT. -1:47p Patient/dtr [name] decline ST SNF prefer home w/HHC-HHPT/OT;3n1-no preference await choices for HHC;[employer] rep [name] to deliver 3n1 to rm prior d/c. May need PTAR @ d/c. -4p-[facility] accepted for HHPT/OT.  Expected Discharge Plan: Skilled Nursing Facility Barriers to Discharge: Continued Medical Work up               Expected Discharge Plan and Services                                               Social Drivers of Health (SDOH) Interventions SDOH Screenings   Food Insecurity: No Food Insecurity (05/19/2024)  Housing: Low Risk (05/19/2024)  Transportation Needs: No Transportation Needs (05/19/2024)  Utilities: Not At Risk (05/19/2024)  Depression (PHQ2-9): Low Risk (04/24/2024)  Tobacco Use: Low Risk (05/18/2024)    Readmission Risk Interventions     No data to display

## 2024-05-23 NOTE — Progress Notes (Signed)
 Initial Nutrition Assessment  DOCUMENTATION CODES:   Severe malnutrition in context of acute illness/injury  INTERVENTION:   -Boost Breeze po TID, each supplement provides 250 kcal and 9 grams of protein   -Liberalize diet to regular given poor PO and CBGs WNL  -Provide High Calorie, High Protein handout in AVS  NUTRITION DIAGNOSIS:   Severe Malnutrition related to acute illness (mucositis) as evidenced by energy intake < or equal to 50% for > or equal to 5 days, percent weight loss.  GOAL:   Patient will meet greater than or equal to 90% of their needs  MONITOR:   PO intake, Supplement acceptance  REASON FOR ASSESSMENT:   Malnutrition Screening Tool, Other (Comment) (Was supposed to see [facility])    ASSESSMENT:   54 y.o. female with medical history significant for well-controlled type 2 diabetes, GERD, hypertension, hyperlipidemia, gastric adenocarcinoma on neoadjuvant chemotherapy presented with weakness, lethargy, poor oral intake due to mucositis with nausea and is currently admitted for neutropenic fever and mucositis.  Patient in room sleeping, daughter at bedside. Pt is followed closely by [facility].  Pt with less mouth swelling and looks better but still reports eating is painful. Eating better per daughter. Consumed all of her lunch yesterday. Pt really wants hot chocolate but cannot have with current diet so will liberalize to promote good PO intakes. Pt did have a hypoglycemic event yesterday morning. Pt has been drinking Ensure at home but doesn't like it. Reports most things taste metallic. Pt's daughter states she is drinking more water  than she has been. Concerned for patient getting dehydrated again. Requests high calorie, high protein diet information and sample menus.  Pt to trial Boost Breeze to see if this is more palatable. Ensure is at bedside from daughter.  Per weight records, pt has lost 22 lbs since 12/9 (14% wt loss x 1 month,  significant for time frame).  Medications: insulin , Magic Mouthwash w/ lidocaine , KLOR-CON , D5 infusion, Zofran   Labs reviewed: CBGs: 118-141 Low potassium   NUTRITION - FOCUSED PHYSICAL EXAM:  Flowsheet Row Most Recent Value  Orbital Region No depletion  Upper Arm Region No depletion  Thoracic and Lumbar Region Unable to assess  Buccal Region Unable to assess  [pain from  mucositis]  Temple Region Mild depletion  Clavicle Bone Region No depletion  Clavicle and Acromion Bone Region No depletion  Scapular Bone Region No depletion  Dorsal Hand No depletion  Patellar Region No depletion  Anterior Thigh Region No depletion  Posterior Calf Region No depletion  Edema (RD Assessment) Mild  Hair Unable to assess  [covered]  Eyes Reviewed  Mouth Reviewed  [mucositis]  Skin Reviewed  Nails Reviewed    Diet Order:   Diet Order             Diet regular Room service appropriate? Yes; Fluid consistency: Thin  Diet effective now                   EDUCATION NEEDS:   Education needs have been addressed  Skin:  Skin Assessment: Reviewed RN Assessment  Last BM:  1/6 -type 7  Height:   Ht Readings from Last 1 Encounters:  05/19/24 5' 3 (1.6 m)    Weight:   Wt Readings from Last 1 Encounters:  05/19/24 58 kg    BMI:  Body mass index is 22.65 kg/m.  Estimated Nutritional Needs:   Kcal:  1800-2000  Protein:  90-105g  Fluid:  2L/day  Morna Lee, MS, RD, LDN Inpatient Clinical Dietitian Contact via Secure chat

## 2024-05-23 NOTE — Discharge Instructions (Signed)

## 2024-05-23 NOTE — Progress Notes (Signed)
 " PROGRESS NOTE    Susan Whitaker  FMW:[id number] DOB: 1970-12-21 DOA: 05/18/2024 PCP: Katheen Roselie Rockford, NP   Brief Narrative:  54 y.o. female with medical history significant for well-controlled type 2 diabetes, GERD, hypertension, hyperlipidemia, gastric adenocarcinoma on neoadjuvant chemotherapy presented with weakness, lethargy, poor oral intake due to mucositis with nausea and is currently admitted for neutropenic fever and mucositis.  She is currently on broad-spectrum antibiotics.  Oncology following.  PT recommending SNF placement.  Assessment & Plan:   Neutropenic fever Pancytopenia Stage Ib gastric adenocarcinoma status post recent chemotherapy Mucositis -Oncology following:.  Receiving filgrastim  as per oncology.  Neutropenia has resolved today.  Hemoglobin and platelets are stable.  Monitor CBC daily. - No temperature spikes over the last 24 hours.  Blood cultures have remained negative so far.  Urine cultures showed insignificant growth.  Currently on cefepime . -Continue miracle mouthwash as needed.  Diet as tolerated  Hypernatremia Dehydration - Hypernatremia is resolved.  Remains on IV fluids due to poor oral intake  Acute metabolic acidosis -Improving.  Monitor  Elevated LFTs - Questionable cause.  AST and ALT remain mildly elevated.  Monitor intermittently  Hypokalemia -Replace.  Monitor  History of diet-controlled diabetes mellitus type 2 with hypoglycemia - Blood sugars intermittently remain low.  Continue IV fluids with D5  Physical deconditioning -PT recommending SNF placement.  TOC following  DVT prophylaxis: None due to thrombocytopenia Code Status: Full Family Communication: daughter at bedside Disposition Plan: Status is: Inpatient Remains inpatient appropriate because: Of severity of illness    Consultants: Oncology  Procedures: None  Antimicrobials:  Anti-infectives (From admission, onward)    Start     Dose/Rate Route Frequency Ordered  Stop   05/19/24 1900  vancomycin  (VANCOCIN ) IVPB 1000 mg/200 mL premix  Status:  Discontinued        1,000 mg 200 mL/hr over 60 Minutes Intravenous Every 24 hours 05/18/24 1919 05/21/24 1200   05/19/24 1600  ceFEPIme  (MAXIPIME ) 2 g in sodium chloride  0.9 % 100 mL IVPB        2 g 200 mL/hr over 30 Minutes Intravenous Every 8 hours 05/19/24 1123     05/19/24 1000  metroNIDAZOLE  (FLAGYL ) IVPB 500 mg  Status:  Discontinued        500 mg 100 mL/hr over 60 Minutes Intravenous Every 12 hours 05/19/24 0815 05/21/24 1200   05/19/24 0500  ceFEPIme  (MAXIPIME ) 2 g in sodium chloride  0.9 % 100 mL IVPB  Status:  Discontinued        2 g 200 mL/hr over 30 Minutes Intravenous Every 12 hours 05/18/24 1919 05/19/24 1123   05/18/24 1930  vancomycin  (VANCOCIN ) IVPB 1000 mg/200 mL premix       Placed in Followed by Linked Group   1,000 mg 200 mL/hr over 60 Minutes Intravenous  Once 05/18/24 1920 05/18/24 2149   05/18/24 1930  vancomycin  (VANCOCIN ) 500 mg in sodium chloride  0.9 % 100 mL IVPB       Placed in Followed by Linked Group   500 mg 100 mL/hr over 60 Minutes Intravenous  Once 05/18/24 1920 05/18/24 2152   05/18/24 1630  ceFEPIme  (MAXIPIME ) 2 g in sodium chloride  0.9 % 100 mL IVPB        2 g 200 mL/hr over 30 Minutes Intravenous  Once 05/18/24 1627 05/18/24 1749   05/18/24 1630  metroNIDAZOLE  (FLAGYL ) IVPB 500 mg        500 mg 100 mL/hr over 60 Minutes Intravenous  Once 05/18/24 1627  05/18/24 1854   05/18/24 1630  vancomycin  (VANCOCIN ) IVPB 1000 mg/200 mL premix  Status:  Discontinued        1,000 mg 200 mL/hr over 60 Minutes Intravenous  Once 05/18/24 1627 05/18/24 1920         Subjective: Patient seen and examined at bedside.  No fever, vomiting reported.  Continues to have intermittent mouth pain.  Oral intake was better yesterday as per daughter at bedside.  Objective: Vitals:   05/22/24 1022 05/22/24 1322 05/22/24 2103 05/23/24 0506  BP: 125/68 127/71 130/74 129/74  Pulse:  81 88  90  Resp:  20 18 16   Temp:  98.4 F (36.9 C) 99 F (37.2 C) 98.6 F (37 C)  TempSrc:  Oral Oral Oral  SpO2:  100% 100% 100%  Weight:      Height:        Intake/Output Summary (Last 24 hours) at 05/23/2024 0729 Last data filed at 05/23/2024 0512 Gross per 24 hour  Intake 772.03 ml  Output 1400 ml  Net -627.97 ml   Filed Weights   05/18/24 1420 05/19/24 1323  Weight: 58.8 kg 58 kg    Examination:  General exam: Appears calm and comfortable.  Looks chronically ill and deconditioned. Respiratory system: Bilateral decreased breath sounds at bases Cardiovascular system: S1 & S2 heard, Rate controlled Gastrointestinal system: Abdomen is slightly distended, soft and nontender. bowel sounds heard. Extremities: No cyanosis, clubbing, edema  Central nervous system: Alert and awake.  Slow to respond.  No focal neurological deficits. Moving extremities Skin: No rashes, lesions or ulcers Psychiatry: Flat affect.  Not agitated.    Data Reviewed: I have personally reviewed following labs and imaging studies  CBC: Recent Labs  Lab 05/18/24 1528 05/19/24 0822 05/20/24 0320 05/21/24 0310 05/22/24 0512 05/23/24 0344  WBC 0.6* 0.7* 0.7* 1.0* 1.5* 3.0*  NEUTROABS 0.0*  --  0.0* 0.1* 0.3* 0.8*  HGB 10.2* 8.2* 7.0* 7.2* 7.5* 7.7*  HCT 30.3* 24.9* 21.2* 21.8* 22.6* 23.0*  MCV 89.4 91.5 91.0 90.8 90.8 89.8  PLT 22* 44* 26* 30* 38* 48*   Basic Metabolic Panel: Recent Labs  Lab 05/19/24 0850 05/19/24 1827 05/20/24 0320 05/21/24 0310 05/22/24 0512 05/23/24 0344  NA  --  150* 146* 140 140 143  K  --  2.9* 3.0* 3.4* 3.4* 3.3*  CL  --  115* 115* 110 108 110  CO2  --  23 22 21* 19* 21*  GLUCOSE  --  121* 106* 172* 65* 112*  BUN  --  18 15 11 7 9   CREATININE  --  0.74 0.71 0.59 0.52 0.53  CALCIUM   --  8.6* 8.0* 7.8* 8.3* 8.4*  MG 2.3  --  2.2 2.1  --   --    GFR: Estimated Creatinine Clearance: 65.7 mL/min (by C-G formula based on SCr of 0.53 mg/dL). Liver Function  Tests: Recent Labs  Lab 05/19/24 0822 05/20/24 0320 05/21/24 0310 05/22/24 0512 05/23/24 0344  AST 25 56* 106* 78* 70*  ALT 19 46* 82* 59* 48*  ALKPHOS 60 47 48 52 57  BILITOT 1.1 1.0 0.7 0.9 1.0  PROT 6.9 6.1* 5.9* 6.4* 6.7  ALBUMIN  3.4* 3.0* 2.8* 2.9* 3.1*   Recent Labs  Lab 05/18/24 1444  LIPASE 14   Recent Labs  Lab 05/18/24 1444  AMMONIA <13   Coagulation Profile: No results for input(s): INR, PROTIME in the last 168 hours. Cardiac Enzymes: No results for input(s): CKTOTAL, CKMB, CKMBINDEX, TROPONINI in the  last 168 hours. BNP (last 3 results) No results for input(s): PROBNP in the last 8760 hours. HbA1C: No results for input(s): HGBA1C in the last 72 hours. CBG: Recent Labs  Lab 05/22/24 0736 05/22/24 0804 05/22/24 1128 05/22/24 1636 05/22/24 2059  GLUCAP 57* 102* 82 106* 114*   Lipid Profile: No results for input(s): CHOL, HDL, LDLCALC, TRIG, CHOLHDL, LDLDIRECT in the last 72 hours. Thyroid  Function Tests: No results for input(s): TSH, T4TOTAL, FREET4, T3FREE, THYROIDAB in the last 72 hours. Anemia Panel: No results for input(s): VITAMINB12, FOLATE, FERRITIN, TIBC, IRON, RETICCTPCT in the last 72 hours. Sepsis Labs: Recent Labs  Lab 05/19/24 9177 05/19/24 1336 05/20/24 1237 05/20/24 1630  LATICACIDVEN 2.4* 2.3* 2.6* 2.5*    Recent Results (from the past 240 hours)  Resp panel by RT-PCR (RSV, Flu A&B, Covid) Anterior Nasal Swab     Status: None   Collection Time: 05/18/24  2:26 PM   Specimen: Anterior Nasal Swab  Result Value Ref Range Status   SARS Coronavirus 2 by RT PCR NEGATIVE NEGATIVE Final    Comment: (NOTE) SARS-CoV-2 target nucleic acids are NOT DETECTED.  The SARS-CoV-2 RNA is generally detectable in upper respiratory specimens during the acute phase of infection. The lowest concentration of SARS-CoV-2 viral copies this assay can detect is 138 copies/mL. A negative result does not  preclude SARS-Cov-2 infection and should not be used as the sole basis for treatment or other patient management decisions. A negative result may occur with  improper specimen collection/handling, submission of specimen other than nasopharyngeal swab, presence of viral mutation(s) within the areas targeted by this assay, and inadequate number of viral copies(<138 copies/mL). A negative result must be combined with clinical observations, patient history, and epidemiological information. The expected result is Negative.  Fact Sheet for Patients:  bloggercourse.com  Fact Sheet for Healthcare Providers:  seriousbroker.it  This test is no t yet approved or cleared by the [place]  FDA and  has been authorized for detection and/or diagnosis of SARS-CoV-2 by FDA under an Emergency Use Authorization (EUA). This EUA will remain  in effect (meaning this test can be used) for the duration of the COVID-19 declaration under Section 564(b)(1) of the Act, 21 U.S.C.section 360bbb-3(b)(1), unless the authorization is terminated  or revoked sooner.       Influenza A by PCR NEGATIVE NEGATIVE Final   Influenza B by PCR NEGATIVE NEGATIVE Final    Comment: (NOTE) The Xpert Xpress SARS-CoV-2/FLU/RSV plus assay is intended as an aid in the diagnosis of influenza from Nasopharyngeal swab specimens and should not be used as a sole basis for treatment. Nasal washings and aspirates are unacceptable for Xpert Xpress SARS-CoV-2/FLU/RSV testing.  Fact Sheet for Patients: bloggercourse.com  Fact Sheet for Healthcare Providers: seriousbroker.it  This test is not yet approved or cleared by the [place]  FDA and has been authorized for detection and/or diagnosis of SARS-CoV-2 by FDA under an Emergency Use Authorization (EUA). This EUA will remain in effect (meaning this test can be used) for the  duration of the COVID-19 declaration under Section 564(b)(1) of the Act, 21 U.S.C. section 360bbb-3(b)(1), unless the authorization is terminated or revoked.     Resp Syncytial Virus by PCR NEGATIVE NEGATIVE Final    Comment: (NOTE) Fact Sheet for Patients: bloggercourse.com  Fact Sheet for Healthcare Providers: seriousbroker.it  This test is not yet approved or cleared by the [place]  FDA and has been authorized for detection and/or diagnosis of SARS-CoV-2 by FDA under an Emergency  Use Authorization (EUA). This EUA will remain in effect (meaning this test can be used) for the duration of the COVID-19 declaration under Section 564(b)(1) of the Act, 21 U.S.C. section 360bbb-3(b)(1), unless the authorization is terminated or revoked.  Performed at [facility], [address] [place], [place] [address]   Blood culture (routine x 2)     Status: None   Collection Time: 05/18/24  2:47 PM   Specimen: BLOOD  Result Value Ref Range Status   Specimen Description   Final    BLOOD LEFT ANTECUBITAL Performed at [facility], [address] [place], [place] [address]    Special Requests   Final    BOTTLES DRAWN AEROBIC AND ANAEROBIC Blood Culture adequate volume Performed at [facility], [address] [place], [place] [address]    Culture   Final    NO GROWTH 5 DAYS Performed at [facility], [address] [place], [place] [address]    Report Status 05/23/2024 FINAL  Final  Urine Culture (for pregnant, neutropenic or urologic patients or patients with an indwelling urinary catheter)     Status: Abnormal   Collection Time: 05/18/24  3:18 PM   Specimen: In/Out Cath Urine  Result Value Ref Range Status   Specimen Description   Final    IN/OUT CATH URINE Performed at [facility], [address] [place], [place] [address]    Special Requests   Final    NONE Performed at  [facility], [address] [place], [place] [address]    Culture (A)  Final    <10,000 COLONIES/mL INSIGNIFICANT GROWTH Performed at [facility], [address], [place], [place] [address]    Report Status 05/20/2024 FINAL  Final  Culture, blood (Routine X 2) w Reflex to ID Panel     Status: None (Preliminary result)   Collection Time: 05/19/24 10:25 AM   Specimen: BLOOD RIGHT HAND  Result Value Ref Range Status   Specimen Description   Final    BLOOD RIGHT HAND Performed at [facility], [address] [place], [place] [address]    Special Requests   Final    BOTTLES DRAWN AEROBIC ONLY Blood Culture results may not be optimal due to an inadequate volume of blood received in culture bottles Performed at [facility], [address] [place], [place] [address]    Culture   Final    NO GROWTH 3 DAYS Performed at [facility], [address], [place], [place] [address]    Report Status PENDING  Incomplete         Radiology Studies: No results found.      Scheduled Meds:  amLODipine   5 mg Oral Daily   chlorhexidine   10 mL Mouth/Throat QID   Chlorhexidine  Gluconate Cloth  6 each Topical Daily   filgrastim  (NIVESTYM ) SQ  300 mcg Subcutaneous q1800   insulin  aspart  0-5 Units Subcutaneous QHS   insulin  aspart  0-6 Units Subcutaneous TID WC   magic mouthwash w/lidocaine   5 mL Oral QID   sodium chloride  flush  10-40 mL Intracatheter Q12H   Continuous Infusions:  ceFEPime  (MAXIPIME ) IV 2 g (05/22/24 2329)   dextrose  5 % and 0.9 % NaCl with KCl 40 mEq/L 40 mL/hr at 05/22/24 0829          Sophie Mao, MD [facility] 05/23/2024, 7:29 AM   "

## 2024-05-24 ENCOUNTER — Inpatient Hospital Stay: Admitting: Physician Assistant

## 2024-05-24 ENCOUNTER — Inpatient Hospital Stay

## 2024-05-24 ENCOUNTER — Inpatient Hospital Stay: Admitting: Nutrition

## 2024-05-24 DIAGNOSIS — D709 Neutropenia, unspecified: Secondary | ICD-10-CM | POA: Diagnosis not present

## 2024-05-24 DIAGNOSIS — R5081 Fever presenting with conditions classified elsewhere: Secondary | ICD-10-CM | POA: Diagnosis not present

## 2024-05-24 LAB — CBC WITH DIFFERENTIAL/PLATELET
Basophils Absolute: 0 K/uL (ref 0.0–0.1)
Basophils Relative: 0 %
Eosinophils Absolute: 0.1 K/uL (ref 0.0–0.5)
Eosinophils Relative: 2 %
HCT: 20.6 % — ABNORMAL LOW (ref 36.0–46.0)
Hemoglobin: 7 g/dL — ABNORMAL LOW (ref 12.0–15.0)
Lymphocytes Relative: 47 %
Lymphs Abs: 1.5 K/uL (ref 0.7–4.0)
MCH: 30.4 pg (ref 26.0–34.0)
MCHC: 34 g/dL (ref 30.0–36.0)
MCV: 89.6 fL (ref 80.0–100.0)
Monocytes Absolute: 0 K/uL — ABNORMAL LOW (ref 0.1–1.0)
Monocytes Relative: 1 %
Neutro Abs: 1.6 K/uL — ABNORMAL LOW (ref 1.7–7.7)
Neutrophils Relative %: 50 %
Platelets: 40 K/uL — ABNORMAL LOW (ref 150–400)
RBC: 2.3 MIL/uL — ABNORMAL LOW (ref 3.87–5.11)
RDW: 20 % — ABNORMAL HIGH (ref 11.5–15.5)
WBC: 3.2 K/uL — ABNORMAL LOW (ref 4.0–10.5)
nRBC: 0 % (ref 0.0–0.2)

## 2024-05-24 LAB — CULTURE, BLOOD (ROUTINE X 2): Culture: NO GROWTH

## 2024-05-24 LAB — GLUCOSE, CAPILLARY
Glucose-Capillary: 153 mg/dL — ABNORMAL HIGH (ref 70–99)
Glucose-Capillary: 124 mg/dL — ABNORMAL HIGH (ref 70–99)
Glucose-Capillary: 144 mg/dL — ABNORMAL HIGH (ref 70–99)
Glucose-Capillary: 138 mg/dL — ABNORMAL HIGH (ref 70–99)

## 2024-05-24 LAB — BASIC METABOLIC PANEL WITH GFR
Anion gap: 9 (ref 5–15)
BUN: 10 mg/dL (ref 6–20)
CO2: 24 mmol/L (ref 22–32)
Calcium: 8 mg/dL — ABNORMAL LOW (ref 8.9–10.3)
Chloride: 107 mmol/L (ref 98–111)
Creatinine, Ser: 0.44 mg/dL (ref 0.44–1.00)
GFR, Estimated: >60 mL/min
Glucose, Bld: 128 mg/dL — ABNORMAL HIGH (ref 70–99)
Potassium: 2.7 mmol/L — CL (ref 3.5–5.1)
Sodium: 140 mmol/L (ref 135–145)

## 2024-05-24 LAB — MAGNESIUM: Magnesium: 1.9 mg/dL (ref 1.7–2.4)

## 2024-05-24 MED ORDER — ORAL CARE MOUTH RINSE
15.0000 mL | OROMUCOSAL | Status: DC
  Administered 2024-05-28: 15 mL via OROMUCOSAL

## 2024-05-24 MED ORDER — POTASSIUM CHLORIDE CRYS ER 20 MEQ PO TBCR
40.0000 meq | EXTENDED_RELEASE_TABLET | Freq: Four times a day (QID) | ORAL | Status: AC
  Administered 2024-05-24 (×2): 40 meq via ORAL
  Filled 2024-05-24 (×2): qty 2

## 2024-05-24 MED ORDER — ORAL CARE MOUTH RINSE: 15.0000 mL | OROMUCOSAL | Status: DC | PRN

## 2024-05-24 NOTE — Progress Notes (Addendum)
 Physical Therapy Treatment Patient Details Name: Susan Whitaker MRN: [id number] DOB: 25-Sep-1970 Today's Date: 05/24/2024   History of Present Illness 54 y.o. female with medical history significant for well-controlled type 2 diabetes, GERD, hypertension, hyperlipidemia, gastric adenocarcinoma on neoadjuvant chemotherapy being admitted to the hospital with neutropenic fever, weakness    PT Comments  Per chart review, Pt plans to return home with 24/7 Family support. Session focused on hands on Family Training with 2 Daughters.  Cognition Comments: Pt with eyes mainly shut during session but was able to follow commands and respond.  2 VERY support daughters in room during session so performed Family Training on bed positioning, assisting Pt to seated EOB with constant guarding as Pt was unable to support herself, assisted with transfering to and from [facility] which required + 2 assist.  General bed mobility comments: Demonstrated and Instructed 2 Daughters how to reposition Pt in bed as well as assisting to seated EOB.  Pt unable to support herself, so Educated to be hands on all times as Pt is a HIGH FALL RISK.  Educated on repositioning every 2 hours and use of multiple pillows for comfort.  General transfer comment: Two person face to face technique used to stand at EOB. Knees blocked initially to prevent buckling. Had 2 Daughters hands on assist from bed to [facility] then back to bed with Instructions on safe handling.    Pt was too weak to attempt amb.  BSC was delivered to room.  Family asking about the wheelchair.  Secure chat sent to [facility].  Pt might need PTAR transport home as she has 2 steps to enter.     If plan is discharge home, recommend the following: Two people to help with walking and/or transfers;A lot of help with bathing/dressing/bathroom;Assistance with cooking/housework;Assist for transportation;Help with stairs or ramp for entrance;Direct supervision/assist for medications  management   Can travel by private vehicle     No  Equipment Recommendations  Wheelchair (measurements PT);Wheelchair cushion (measurements PT);Hospital bed    Recommendations for Other Services       Precautions / Restrictions Precautions Precautions: Fall Recall of Precautions/Restrictions: Impaired Restrictions Weight Bearing Restrictions Per Provider Order: No     Mobility  Bed Mobility Overal bed mobility: Needs Assistance Bed Mobility: Rolling, Supine to Sit, Sit to Supine Rolling: Min assist, Mod assist   Supine to sit: Mod assist, Max assist, +2 for physical assistance, +2 for safety/equipment Sit to supine: Max assist, Total assist, +2 for physical assistance, +2 for safety/equipment   General bed mobility comments: Demonstrated and Instructed 2 Daughters how to reposition Pt in bed as well as assisting to seated EOB.  Pt unable to support herself, so Educated to be hands on all times as Pt is a HIGH FALL RISK.  Educated on repositioning every 2 hours and use of multiple pillows for comfort.    Transfers Overall transfer level: Needs assistance Equipment used: 2 person hand held assist, Rolling walker (2 wheels) Transfers: Sit to/from Stand Sit to Stand: Max assist, +2 safety/equipment, From elevated surface, +2 physical assistance           General transfer comment: Two person face to face technique used to stand at EOB. Knees blocked initially to prevent buckling. Had 2 Daughters hands on assist from bed to [facility] then back to bed with Instructions on safe handling.    Ambulation/Gait               General Gait Details: unable due to  profound weakness exhibited during transfers   Stairs             Wheelchair Mobility     Tilt Bed    Modified Rankin (Stroke Patients Only)       Balance                                            Communication Communication Communication: No apparent difficulties Factors  Affecting Communication: Reduced clarity of speech  Cognition Arousal: Alert, Lethargic Behavior During Therapy: Flat affect   PT - Cognitive impairments: Difficult to assess                       PT - Cognition Comments: Pt with eyes mainly shut during session but was able to follow commands and respond.  2 VERY support daughters in room during session so performed Family Training on bed positioning, assisting Pt to seated EOB with constant guarding as Pt was unable to support herself, assisted with transfering to and from [facility] which required + 2 assist. Following commands: Intact Following commands impaired: Follows one step commands with increased time    Cueing Cueing Techniques: Verbal cues  Exercises      General Comments        Pertinent Vitals/Pain Pain Assessment Pain Assessment: Faces Faces Pain Scale: Hurts a little bit Pain Location: R side of face, mouth Pain Intervention(s): Monitored during session    Home Living                          Prior Function            PT Goals (current goals can now be found in the care plan section) Progress towards PT goals: Progressing toward goals    Frequency    Min 2X/week      PT Plan      Co-evaluation              AM-PAC PT 6 Clicks Mobility   Outcome Measure  Help needed turning from your back to your side while in a flat bed without using bedrails?: A Lot Help needed moving from lying on your back to sitting on the side of a flat bed without using bedrails?: A Lot Help needed moving to and from a bed to a chair (including a wheelchair)?: A Lot Help needed standing up from a chair using your arms (e.g., wheelchair or bedside chair)?: A Lot Help needed to walk in hospital room?: Total Help needed climbing 3-5 steps with a railing? : Total 6 Click Score: 10    End of Session Equipment Utilized During Treatment: Gait belt Activity Tolerance: Patient limited by fatigue Patient  left: in bed;with call bell/phone within reach;with bed alarm set;with family/visitor present Nurse Communication: Mobility status PT Visit Diagnosis: Muscle weakness (generalized) (M62.81);Difficulty in walking, not elsewhere classified (R26.2)     Time: 8785-8751 PT Time Calculation (min) (ACUTE ONLY): 34 min  Charges:    $Therapeutic Activity: 23-37 mins PT General Charges $$ ACUTE PT VISIT: 1 Visit

## 2024-05-24 NOTE — TOC Progression Note (Signed)
 Transition of Care [facility]) - Progression Note    Patient Details  Name: BRYNN MULGREW MRN: [id number] Date of Birth: Aug 30, 1970  [facility]) CM/SW Contact  Brittny Spangle, Nathanel, RN Phone Number: 05/24/2024, 2:08 PM  Clinical Narrative: [employer] rep [name] to deliver w/c to rm prior d/c. [facility] HHPt/OT already set up. Has own transport home.      Expected Discharge Plan: Home w Home Health Services Barriers to Discharge: No Barriers Identified               Expected Discharge Plan and Services   Discharge Planning Services: CM Consult                     DME Arranged: 3-N-1, Wheelchair manual DME Agency: [employer] Date DME Agency Contacted: 05/24/24 Time DME Agency Contacted: 1408 Representative spoke with at DME Agency: [place]             Social Drivers of Health (SDOH) Interventions SDOH Screenings   Food Insecurity: No Food Insecurity (05/19/2024)  Housing: Low Risk (05/19/2024)  Transportation Needs: No Transportation Needs (05/19/2024)  Utilities: Not At Risk (05/19/2024)  Depression (PHQ2-9): Low Risk (04/24/2024)  Tobacco Use: Low Risk (05/18/2024)    Readmission Risk Interventions     No data to display

## 2024-05-24 NOTE — Progress Notes (Signed)
 Susan Whitaker   DOB:Nov 19, 1970   FM#:[id number]    ASSESSMENT & PLAN:   Stomach cancer She had received chemotherapy recently and her treatment was complicated by fever and mucositis in the outpatient I have canceled her outpatient chemotherapy this week We will continue supportive care only for now She is scheduled for CT imaging next week as outpatient to assess response to therapy with follow-up with Dr. Lanny after that  Acquired pancytopenia Neutropenic fever Her labs this morning showed slight improvement of her white count, platelets and hemoglobin ANC is now 1.5, G-CSF can be discontinued Cultures negative so far Likely source is from mucositis To complete 7 days total of antibiotics  Dehydration, resolving Labs are improving Continue IV fluid support  Severe mucositis pain Continue Magic mouthwash swish and spit along with Dilaudid  IV as needed I warned the patient and family risk of excessive sedation with IV pain medicine, advising them to use it judiciously So far, her pain is well-controlled today  Discharge planning She would likely be here over the next few days I am hopeful she can be discharged over the weekend once her oral intake is improved I will sign off.  She has outpatient follow-up with Dr. Lanny Susan Bedford, MD 05/24/2024 12:49 PM  Subjective:  She is seen this morning, daughter by the bedside.  Overall, she is improving However, oral intake is still poor and she still complain of mucositis  Objective:  Vitals:   05/23/24 2025 05/24/24 0435  BP: 123/66 128/67  Pulse: 71 77  Resp: 20 15  Temp: 98.3 F (36.8 C) 98 F (36.7 C)  SpO2: 100% 100%     Intake/Output Summary (Last 24 hours) at 05/24/2024 1249 Last data filed at 05/24/2024 1000 Gross per 24 hour  Intake 1508.74 ml  Output 1900 ml  Net -391.26 ml

## 2024-05-24 NOTE — Progress Notes (Signed)
 " PROGRESS NOTE    Susan Whitaker  FMW:[id number] DOB: [birth year] DOA: 05/18/2024 PCP: Katheen Roselie Rockford, NP   Brief Narrative:  54 y.o. female with medical history significant for well-controlled type 2 diabetes, GERD, hypertension, hyperlipidemia, gastric adenocarcinoma on neoadjuvant chemotherapy presented with weakness, lethargy, poor oral intake due to mucositis with nausea and is currently admitted for neutropenic fever and mucositis.  She is currently on broad-spectrum antibiotics.  Oncology following.  PT recommending SNF placement.  Assessment & Plan:   Neutropenic fever Pancytopenia Stage Ib gastric adenocarcinoma status post recent chemotherapy Mucositis -Oncology following: filgrastim  discontinued today as neutropenia has resolved.  Hemoglobin 7 today.  Transfuse 1 unit packed red cells.  Platelets are stable.  Monitor CBC daily. - No temperature spikes over the last 48 hours.  Blood cultures have remained negative so far.  Urine cultures showed insignificant growth.  Currently on cefepime . -Continue miracle mouthwash as needed.  Diet as tolerated  Hypernatremia Dehydration - Hypernatremia is resolved.  Remains on IV fluids due to poor oral intake  Acute metabolic acidosis - Resolved  Elevated LFTs - Questionable cause.  AST and ALT mildly elevated.  Monitor intermittently  Hypokalemia -Replace.  Repeat a.m. labs  History of diet-controlled diabetes mellitus type 2 with hypoglycemia - Blood sugars improving.  Currently on IV fluids with dextrose .  Physical deconditioning -PT recommending SNF placement.  TOC following.  Patient declining SNF.  DVT prophylaxis: None due to thrombocytopenia Code Status: Full Family Communication: daughter at bedside Disposition Plan: Status is: Inpatient Remains inpatient appropriate because: Of severity of illness    Consultants: Oncology  Procedures: None  Antimicrobials:  Anti-infectives (From admission, onward)     Start     Dose/Rate Route Frequency Ordered Stop   05/19/24 1900  vancomycin  (VANCOCIN ) IVPB 1000 mg/200 mL premix  Status:  Discontinued        1,000 mg 200 mL/hr over 60 Minutes Intravenous Every 24 hours 05/18/24 1919 05/21/24 1200   05/19/24 1600  ceFEPIme  (MAXIPIME ) 2 g in sodium chloride  0.9 % 100 mL IVPB        2 g 200 mL/hr over 30 Minutes Intravenous Every 8 hours 05/19/24 1123 05/25/24 1500   05/19/24 1000  metroNIDAZOLE  (FLAGYL ) IVPB 500 mg  Status:  Discontinued        500 mg 100 mL/hr over 60 Minutes Intravenous Every 12 hours 05/19/24 0815 05/21/24 1200   05/19/24 0500  ceFEPIme  (MAXIPIME ) 2 g in sodium chloride  0.9 % 100 mL IVPB  Status:  Discontinued        2 g 200 mL/hr over 30 Minutes Intravenous Every 12 hours 05/18/24 1919 05/19/24 1123   05/18/24 1930  vancomycin  (VANCOCIN ) IVPB 1000 mg/200 mL premix       Placed in Followed by Linked Group   1,000 mg 200 mL/hr over 60 Minutes Intravenous  Once 05/18/24 1920 05/18/24 2149   05/18/24 1930  vancomycin  (VANCOCIN ) 500 mg in sodium chloride  0.9 % 100 mL IVPB       Placed in Followed by Linked Group   500 mg 100 mL/hr over 60 Minutes Intravenous  Once 05/18/24 1920 05/18/24 2152   05/18/24 1630  ceFEPIme  (MAXIPIME ) 2 g in sodium chloride  0.9 % 100 mL IVPB        2 g 200 mL/hr over 30 Minutes Intravenous  Once 05/18/24 1627 05/18/24 1749   05/18/24 1630  metroNIDAZOLE  (FLAGYL ) IVPB 500 mg        500 mg 100 mL/hr  over 60 Minutes Intravenous  Once 05/18/24 1627 05/18/24 1854   05/18/24 1630  vancomycin  (VANCOCIN ) IVPB 1000 mg/200 mL premix  Status:  Discontinued        1,000 mg 200 mL/hr over 60 Minutes Intravenous  Once 05/18/24 1627 05/18/24 1920         Subjective: Patient seen and examined at bedside.  Oral intake is improving.  Continues to have intermittent mouth pain.  No chest pain, shortness of breath reported. Objective: Vitals:   05/23/24 0506 05/23/24 1339 05/23/24 2025 05/24/24 0435  BP: 129/74  116/65 123/66 128/67  Pulse: 90 73 71 77  Resp: 16 16 20 15   Temp: 98.6 F (37 C) 98 F (36.7 C) 98.3 F (36.8 C) 98 F (36.7 C)  TempSrc: Oral Oral Oral Oral  SpO2: 100% 100% 100% 100%  Weight:      Height:        Intake/Output Summary (Last 24 hours) at 05/24/2024 0753 Last data filed at 05/23/2024 1647 Gross per 24 hour  Intake 1131.17 ml  Output 1200 ml  Net -68.83 ml   Filed Weights   05/18/24 1420 05/19/24 1323  Weight: 58.8 kg 58 kg    Examination:  General: On room air.  No distress ENT/neck: No thyromegaly.  JVD is not elevated  respiratory: Decreased breath sounds at bases bilaterally with some crackles; no wheezing  CVS: S1-S2 heard, rate controlled currently Abdominal: Soft, nontender, mildly distended; no organomegaly, bowel sounds are heard Extremities: Trace lower extremity edema; no cyanosis  CNS: Awake and alert.  No focal neurologic deficit.  Moves extremities Lymph: No obvious lymphadenopathy Skin: No obvious ecchymosis/lesions  psych: Affect is mostly flat.  Currently not agitated.   musculoskeletal: No obvious joint swelling/deformity     Data Reviewed: I have personally reviewed following labs and imaging studies  CBC: Recent Labs  Lab 05/20/24 0320 05/21/24 0310 05/22/24 0512 05/23/24 0344 05/24/24 0258  WBC 0.7* 1.0* 1.5* 3.0* 3.2*  NEUTROABS 0.0* 0.1* 0.3* 0.8* 1.6*  HGB 7.0* 7.2* 7.5* 7.7* 7.0*  HCT 21.2* 21.8* 22.6* 23.0* 20.6*  MCV 91.0 90.8 90.8 89.8 89.6  PLT 26* 30* 38* 48* 40*   Basic Metabolic Panel: Recent Labs  Lab 05/19/24 0850 05/19/24 1827 05/20/24 0320 05/21/24 0310 05/22/24 0512 05/23/24 0344 05/24/24 0258  NA  --    < > 146* 140 140 143 140  K  --    < > 3.0* 3.4* 3.4* 3.3* 2.7*  CL  --    < > 115* 110 108 110 107  CO2  --    < > 22 21* 19* 21* 24  GLUCOSE  --    < > 106* 172* 65* 112* 128*  BUN  --    < > 15 11 7 9 10   CREATININE  --    < > 0.71 0.59 0.52 0.53 0.44  CALCIUM   --    < > 8.0* 7.8* 8.3*  8.4* 8.0*  MG 2.3  --  2.2 2.1  --   --  1.9   < > = values in this interval not displayed.   GFR: Estimated Creatinine Clearance: 65.7 mL/min (by C-G formula based on SCr of 0.44 mg/dL). Liver Function Tests: Recent Labs  Lab 05/19/24 0822 05/20/24 0320 05/21/24 0310 05/22/24 0512 05/23/24 0344  AST 25 56* 106* 78* 70*  ALT 19 46* 82* 59* 48*  ALKPHOS 60 47 48 52 57  BILITOT 1.1 1.0 0.7 0.9 1.0  PROT 6.9  6.1* 5.9* 6.4* 6.7  ALBUMIN  3.4* 3.0* 2.8* 2.9* 3.1*   Recent Labs  Lab 05/18/24 1444  LIPASE 14   Recent Labs  Lab 05/18/24 1444  AMMONIA <13   Coagulation Profile: No results for input(s): INR, PROTIME in the last 168 hours. Cardiac Enzymes: No results for input(s): CKTOTAL, CKMB, CKMBINDEX, TROPONINI in the last 168 hours. BNP (last 3 results) No results for input(s): PROBNP in the last 8760 hours. HbA1C: No results for input(s): HGBA1C in the last 72 hours. CBG: Recent Labs  Lab 05/23/24 0732 05/23/24 1127 05/23/24 1627 05/23/24 2023 05/24/24 0738  GLUCAP 118* 141* 131* 130* 153*   Lipid Profile: No results for input(s): CHOL, HDL, LDLCALC, TRIG, CHOLHDL, LDLDIRECT in the last 72 hours. Thyroid  Function Tests: No results for input(s): TSH, T4TOTAL, FREET4, T3FREE, THYROIDAB in the last 72 hours. Anemia Panel: No results for input(s): VITAMINB12, FOLATE, FERRITIN, TIBC, IRON, RETICCTPCT in the last 72 hours. Sepsis Labs: Recent Labs  Lab 05/19/24 9177 05/19/24 1336 05/20/24 1237 05/20/24 1630  LATICACIDVEN 2.4* 2.3* 2.6* 2.5*    Recent Results (from the past 240 hours)  Resp panel by RT-PCR (RSV, Flu A&B, Covid) Anterior Nasal Swab     Status: None   Collection Time: 05/18/24  2:26 PM   Specimen: Anterior Nasal Swab  Result Value Ref Range Status   SARS Coronavirus 2 by RT PCR NEGATIVE NEGATIVE Final    Comment: (NOTE) SARS-CoV-2 target nucleic acids are NOT DETECTED.  The SARS-CoV-2 RNA is  generally detectable in upper respiratory specimens during the acute phase of infection. The lowest concentration of SARS-CoV-2 viral copies this assay can detect is 138 copies/mL. A negative result does not preclude SARS-Cov-2 infection and should not be used as the sole basis for treatment or other patient management decisions. A negative result may occur with  improper specimen collection/handling, submission of specimen other than nasopharyngeal swab, presence of viral mutation(s) within the areas targeted by this assay, and inadequate number of viral copies(<138 copies/mL). A negative result must be combined with clinical observations, patient history, and epidemiological information. The expected result is Negative.  Fact Sheet for Patients:  bloggercourse.com  Fact Sheet for Healthcare Providers:  seriousbroker.it  This test is no t yet approved or cleared by the [place]  FDA and  has been authorized for detection and/or diagnosis of SARS-CoV-2 by FDA under an Emergency Use Authorization (EUA). This EUA will remain  in effect (meaning this test can be used) for the duration of the COVID-19 declaration under Section 564(b)(1) of the Act, 21 U.S.C.section 360bbb-3(b)(1), unless the authorization is terminated  or revoked sooner.       Influenza A by PCR NEGATIVE NEGATIVE Final   Influenza B by PCR NEGATIVE NEGATIVE Final    Comment: (NOTE) The Xpert Xpress SARS-CoV-2/FLU/RSV plus assay is intended as an aid in the diagnosis of influenza from Nasopharyngeal swab specimens and should not be used as a sole basis for treatment. Nasal washings and aspirates are unacceptable for Xpert Xpress SARS-CoV-2/FLU/RSV testing.  Fact Sheet for Patients: bloggercourse.com  Fact Sheet for Healthcare Providers: seriousbroker.it  This test is not yet approved or cleared by the [place] FDA and has been authorized for detection and/or diagnosis of SARS-CoV-2 by FDA under an Emergency Use Authorization (EUA). This EUA will remain in effect (meaning this test can be used) for the duration of the COVID-19 declaration under Section 564(b)(1) of the Act, 21 U.S.C. section 360bbb-3(b)(1), unless the authorization is terminated  or revoked.     Resp Syncytial Virus by PCR NEGATIVE NEGATIVE Final    Comment: (NOTE) Fact Sheet for Patients: bloggercourse.com  Fact Sheet for Healthcare Providers: seriousbroker.it  This test is not yet approved or cleared by the [place]  FDA and has been authorized for detection and/or diagnosis of SARS-CoV-2 by FDA under an Emergency Use Authorization (EUA). This EUA will remain in effect (meaning this test can be used) for the duration of the COVID-19 declaration under Section 564(b)(1) of the Act, 21 U.S.C. section 360bbb-3(b)(1), unless the authorization is terminated or revoked.  Performed at [facility], [address] [place], [place] [address]   Blood culture (routine x 2)     Status: None   Collection Time: 05/18/24  2:47 PM   Specimen: BLOOD  Result Value Ref Range Status   Specimen Description   Final    BLOOD LEFT ANTECUBITAL Performed at [facility], [address] [place], [place] [address]    Special Requests   Final    BOTTLES DRAWN AEROBIC AND ANAEROBIC Blood Culture adequate volume Performed at [facility], [address] [place], [place] [address]    Culture   Final    NO GROWTH 5 DAYS Performed at [facility], [address], [place], [place] [address]    Report Status 05/23/2024 FINAL  Final  Urine Culture (for pregnant, neutropenic or urologic patients or patients with an indwelling urinary catheter)     Status: Abnormal   Collection Time: 05/18/24  3:18 PM   Specimen: In/Out Cath Urine  Result Value  Ref Range Status   Specimen Description   Final    IN/OUT CATH URINE Performed at [facility], [address] [place], [place] [address]    Special Requests   Final    NONE Performed at [facility], [address] [place], [place] [address]    Culture (A)  Final    <10,000 COLONIES/mL INSIGNIFICANT GROWTH Performed at [facility], [address], [place], [place] [address]    Report Status 05/20/2024 FINAL  Final  Culture, blood (Routine X 2) w Reflex to ID Panel     Status: None   Collection Time: 05/19/24 10:25 AM   Specimen: BLOOD RIGHT HAND  Result Value Ref Range Status   Specimen Description   Final    BLOOD RIGHT HAND Performed at [facility], [address] [place], [place] [address]    Special Requests   Final    BOTTLES DRAWN AEROBIC ONLY Blood Culture results may not be optimal due to an inadequate volume of blood received in culture bottles Performed at [facility], [address], [place], [place] [address]    Culture   Final    NO GROWTH 5 DAYS Performed at [facility], [address], [place], [place] [address]    Report Status 05/24/2024 FINAL  Final         Radiology Studies: No results found.      Scheduled Meds:  amLODipine   5 mg Oral Daily   chlorhexidine   10 mL Mouth/Throat QID   Chlorhexidine  Gluconate Cloth  6 each Topical Daily   feeding supplement  1 Container Oral TID BM   insulin  aspart  0-5 Units Subcutaneous QHS   insulin  aspart  0-6 Units Subcutaneous TID WC   magic mouthwash w/lidocaine   5 mL  Oral QID   potassium chloride   40 mEq Oral Q6H   sodium chloride  flush  10-40 mL Intracatheter Q12H   Continuous Infusions:  ceFEPime  (MAXIPIME ) IV Stopped (05/24/24 0745)   dextrose  5 % and 0.9 % NaCl with KCl 40 mEq/L 40 mL/hr at 05/23/24 1152          Juliani Laduke, MD [facility] 05/24/2024, 7:53 AM   "

## 2024-05-25 DIAGNOSIS — R5081 Fever presenting with conditions classified elsewhere: Secondary | ICD-10-CM | POA: Diagnosis not present

## 2024-05-25 DIAGNOSIS — D709 Neutropenia, unspecified: Secondary | ICD-10-CM | POA: Diagnosis not present

## 2024-05-25 LAB — CBC WITH DIFFERENTIAL/PLATELET
Abs Immature Granulocytes: 0 K/uL (ref 0.00–0.07)
Abs Immature Granulocytes: 0.18 K/uL — ABNORMAL HIGH (ref 0.00–0.07)
Band Neutrophils: 0 %
Basophils Absolute: 0 K/uL (ref 0.0–0.1)
Basophils Absolute: 0 K/uL (ref 0.0–0.1)
Basophils Relative: 0 %
Basophils Relative: 0 %
Blasts: 0 %
Eosinophils Absolute: 0 K/uL (ref 0.0–0.5)
Eosinophils Absolute: 0.1 K/uL (ref 0.0–0.5)
Eosinophils Relative: 0 %
Eosinophils Relative: 3 %
HCT: 21.2 % — ABNORMAL LOW (ref 36.0–46.0)
HCT: 20.4 % — ABNORMAL LOW (ref 36.0–46.0)
Hemoglobin: 7 g/dL — ABNORMAL LOW (ref 12.0–15.0)
Hemoglobin: 7.1 g/dL — ABNORMAL LOW (ref 12.0–15.0)
Immature Granulocytes: 0 %
Immature Granulocytes: 6 %
Lymphocytes Relative: 96 %
Lymphocytes Relative: 73 %
Lymphs Abs: 0.7 K/uL (ref 0.7–4.0)
Lymphs Abs: 2.2 K/uL (ref 0.7–4.0)
MCH: 30 pg (ref 26.0–34.0)
MCH: 30.6 pg (ref 26.0–34.0)
MCHC: 33 g/dL (ref 30.0–36.0)
MCHC: 34.8 g/dL (ref 30.0–36.0)
MCV: 91 fL (ref 80.0–100.0)
MCV: 87.9 fL (ref 80.0–100.0)
Metamyelocytes Relative: 0 %
Monocytes Absolute: 0 K/uL — ABNORMAL LOW (ref 0.1–1.0)
Monocytes Absolute: 0.1 K/uL (ref 0.1–1.0)
Monocytes Relative: 3 %
Monocytes Relative: 3 %
Myelocytes: 0 %
Neutro Abs: 0 K/uL — CL (ref 1.7–7.7)
Neutro Abs: 0.4 K/uL — CL (ref 1.7–7.7)
Neutrophils Relative %: 1 %
Neutrophils Relative %: 15 %
Other: 0 %
Platelets: 26 K/uL — CL (ref 150–400)
Platelets: 29 K/uL — CL (ref 150–400)
Promyelocytes Relative: 0 %
RBC: 2.33 MIL/uL — ABNORMAL LOW (ref 3.87–5.11)
RBC: 2.32 MIL/uL — ABNORMAL LOW (ref 3.87–5.11)
RDW: 19.4 % — ABNORMAL HIGH (ref 11.5–15.5)
RDW: 19.9 % — ABNORMAL HIGH (ref 11.5–15.5)
WBC: 0.7 K/uL — CL (ref 4.0–10.5)
WBC: 3 K/uL — ABNORMAL LOW (ref 4.0–10.5)
nRBC: 0 % (ref 0.0–0.2)
nRBC: 0 /100{WBCs}
nRBC: 0.7 % — ABNORMAL HIGH (ref 0.0–0.2)

## 2024-05-25 LAB — COMPREHENSIVE METABOLIC PANEL WITH GFR
ALT: 32 U/L (ref 0–44)
AST: 46 U/L — ABNORMAL HIGH (ref 15–41)
Albumin: 2.9 g/dL — ABNORMAL LOW (ref 3.5–5.0)
Alkaline Phosphatase: 49 U/L (ref 38–126)
Anion gap: 8 (ref 5–15)
BUN: 11 mg/dL (ref 6–20)
CO2: 24 mmol/L (ref 22–32)
Calcium: 7.8 mg/dL — ABNORMAL LOW (ref 8.9–10.3)
Chloride: 111 mmol/L (ref 98–111)
Creatinine, Ser: 0.42 mg/dL — ABNORMAL LOW (ref 0.44–1.00)
GFR, Estimated: >60 mL/min
Glucose, Bld: 198 mg/dL — ABNORMAL HIGH (ref 70–99)
Potassium: 3.6 mmol/L (ref 3.5–5.1)
Sodium: 143 mmol/L (ref 135–145)
Total Bilirubin: 0.8 mg/dL (ref 0.0–1.2)
Total Protein: 6.2 g/dL — ABNORMAL LOW (ref 6.5–8.1)

## 2024-05-25 LAB — GLUCOSE, CAPILLARY
Glucose-Capillary: 123 mg/dL — ABNORMAL HIGH (ref 70–99)
Glucose-Capillary: 131 mg/dL — ABNORMAL HIGH (ref 70–99)
Glucose-Capillary: 135 mg/dL — ABNORMAL HIGH (ref 70–99)
Glucose-Capillary: 148 mg/dL — ABNORMAL HIGH (ref 70–99)

## 2024-05-25 LAB — MAGNESIUM: Magnesium: 2 mg/dL (ref 1.7–2.4)

## 2024-05-25 LAB — HEMOGLOBIN AND HEMATOCRIT, BLOOD
HCT: 25.5 % — ABNORMAL LOW (ref 36.0–46.0)
Hemoglobin: 9.1 g/dL — ABNORMAL LOW (ref 12.0–15.0)

## 2024-05-25 LAB — PREPARE RBC (CROSSMATCH)

## 2024-05-25 MED ORDER — SODIUM CHLORIDE 0.9% IV SOLUTION: Freq: Once | INTRAVENOUS | Status: AC

## 2024-05-25 MED ORDER — FILGRASTIM-AAFI 300 MCG/0.5ML IJ SOSY
300.0000 ug | PREFILLED_SYRINGE | Freq: Every day | INTRAMUSCULAR | Status: DC
  Administered 2024-05-25 – 2024-05-28 (×4): 300 ug via SUBCUTANEOUS
  Filled 2024-05-25 (×4): qty 0.5

## 2024-05-25 MED ORDER — HYDROCORTISONE (PERIANAL) 2.5 % EX CREA
TOPICAL_CREAM | Freq: Two times a day (BID) | CUTANEOUS | Status: DC
  Filled 2024-05-25: qty 28.35

## 2024-05-25 NOTE — Plan of Care (Signed)
   Problem: Education: Goal: Knowledge of General Education information will improve Description: Including pain rating scale, medication(s)/side effects and non-pharmacologic comfort measures Outcome: Progressing   Problem: Nutrition: Goal: Adequate nutrition will be maintained Outcome: Progressing   Problem: Coping: Goal: Level of anxiety will decrease Outcome: Progressing   Problem: Elimination: Goal: Will not experience complications related to urinary retention Outcome: Progressing   Problem: Pain Managment: Goal: General experience of comfort will improve and/or be controlled Outcome: Progressing   Problem: Safety: Goal: Ability to remain free from injury will improve Outcome: Progressing   Problem: Skin Integrity: Goal: Risk for impaired skin integrity will decrease Outcome: Progressing

## 2024-05-25 NOTE — Progress Notes (Addendum)
 " PROGRESS NOTE    Susan Whitaker  FMW:[id number] DOB: 1970-11-11 DOA: 05/18/2024 PCP: Katheen Roselie Rockford, NP   Brief Narrative:  54 y.o. female with medical history significant for well-controlled type 2 diabetes, GERD, hypertension, hyperlipidemia, gastric adenocarcinoma on neoadjuvant chemotherapy presented with weakness, lethargy, poor oral intake due to mucositis with nausea and is currently admitted for neutropenic fever and mucositis.  She was treated with broad-spectrum antibiotics.  Oncology following.  PT recommending SNF placement.  Patient/family not interested in SNF placement.  Assessment & Plan:   Neutropenic fever Pancytopenia Stage Ib gastric adenocarcinoma status post recent chemotherapy Mucositis Severe sepsis: Present on admission -Oncology signed off on 05/24/2024 and recommended outpatient oncology follow-up: filgrastim  discontinued on 05/24/2024 as neutropenia has resolved.  Hemoglobin pending today.  Monitor CBC daily. - Sepsis has resolved.  No temperature spikes over the last 72 hours.  Blood cultures have remained negative so far.  Urine cultures showed insignificant growth.  DC cefepime  after today's dose -Continue miracle mouthwash as needed.  Diet as tolerated  Hypernatremia Dehydration - Hypernatremia is resolved. -IV fluids discontinued on 05/24/2024.  Encourage oral intake  Acute metabolic acidosis - Resolved  Elevated LFTs - Questionable cause.  AST and ALT mildly elevated.  Monitor intermittently  Hypokalemia - Labs pending today  History of diet-controlled diabetes mellitus type 2 with hypoglycemia - Blood sugars improving.  Off of IV fluids  Physical deconditioning -PT recommending SNF placement.  TOC following.  Patient declining SNF.  Will need home health PT  DVT prophylaxis: None due to thrombocytopenia Code Status: Full Family Communication: daughter at bedside Disposition Plan: Status is: Inpatient Remains inpatient appropriate because:  Of severity of illness    Consultants: Oncology  Procedures: None  Antimicrobials:  Anti-infectives (From admission, onward)    Start     Dose/Rate Route Frequency Ordered Stop   05/19/24 1900  vancomycin  (VANCOCIN ) IVPB 1000 mg/200 mL premix  Status:  Discontinued        1,000 mg 200 mL/hr over 60 Minutes Intravenous Every 24 hours 05/18/24 1919 05/21/24 1200   05/19/24 1600  ceFEPIme  (MAXIPIME ) 2 g in sodium chloride  0.9 % 100 mL IVPB        2 g 200 mL/hr over 30 Minutes Intravenous Every 8 hours 05/19/24 1123 05/25/24 1500   05/19/24 1000  metroNIDAZOLE  (FLAGYL ) IVPB 500 mg  Status:  Discontinued        500 mg 100 mL/hr over 60 Minutes Intravenous Every 12 hours 05/19/24 0815 05/21/24 1200   05/19/24 0500  ceFEPIme  (MAXIPIME ) 2 g in sodium chloride  0.9 % 100 mL IVPB  Status:  Discontinued        2 g 200 mL/hr over 30 Minutes Intravenous Every 12 hours 05/18/24 1919 05/19/24 1123   05/18/24 1930  vancomycin  (VANCOCIN ) IVPB 1000 mg/200 mL premix       Placed in Followed by Linked Group   1,000 mg 200 mL/hr over 60 Minutes Intravenous  Once 05/18/24 1920 05/18/24 2149   05/18/24 1930  vancomycin  (VANCOCIN ) 500 mg in sodium chloride  0.9 % 100 mL IVPB       Placed in Followed by Linked Group   500 mg 100 mL/hr over 60 Minutes Intravenous  Once 05/18/24 1920 05/18/24 2152   05/18/24 1630  ceFEPIme  (MAXIPIME ) 2 g in sodium chloride  0.9 % 100 mL IVPB        2 g 200 mL/hr over 30 Minutes Intravenous  Once 05/18/24 1627 05/18/24 1749   05/18/24  1630  metroNIDAZOLE  (FLAGYL ) IVPB 500 mg        500 mg 100 mL/hr over 60 Minutes Intravenous  Once 05/18/24 1627 05/18/24 1854   05/18/24 1630  vancomycin  (VANCOCIN ) IVPB 1000 mg/200 mL premix  Status:  Discontinued        1,000 mg 200 mL/hr over 60 Minutes Intravenous  Once 05/18/24 1627 05/18/24 1920         Subjective: Patient seen and examined at bedside.  Continues to have mouth pain intermittently.  Oral intake is improving.   Does not feel ready to go home today.  Still feels weak.  No fever, vomiting, or diarrhea reported. Objective: Vitals:   05/24/24 0435 05/24/24 1337 05/24/24 2037 05/25/24 0549  BP: 128/67 117/66 122/63 111/63  Pulse: 77 70 71 69  Resp: 15 16 16 16   Temp: 98 F (36.7 C) 97.9 F (36.6 C) 98.3 F (36.8 C) 98.1 F (36.7 C)  TempSrc: Oral Oral Oral Oral  SpO2: 100% 100% 100% 100%  Weight:      Height:        Intake/Output Summary (Last 24 hours) at 05/25/2024 0736 Last data filed at 05/24/2024 1800 Gross per 24 hour  Intake 1443.4 ml  Output 700 ml  Net 743.4 ml   Filed Weights   05/18/24 1420 05/19/24 1323  Weight: 58.8 kg 58 kg    Examination:  General: No acute distress.  Remains on room air.  Chronically ill and deconditioned appearing. ENT/neck: No JVD elevation or palpable neck masses  respiratory: Decreased breath sounds at bases with scattered crackles  CVS: Rate currently controlled; S1 and S2 heard  abdominal: Soft, nontender, distended slightly; no organomegaly, bowel sounds are heard normally Extremities: No clubbing; mild lower extremity edema present CNS: Alert and oriented.  Slow to respond.  No focal neurologic deficit.  Able to move extremities Lymph: No obvious palpable lymphadenopathy Skin: No obvious petechiae/rashes  psych: Mostly flat affect.  Not agitated currently.   Musculoskeletal: No obvious joint tenderness/erythema    Data Reviewed: I have personally reviewed following labs and imaging studies  CBC: Recent Labs  Lab 05/20/24 0320 05/21/24 0310 05/22/24 0512 05/23/24 0344 05/24/24 0258  WBC 0.7* 1.0* 1.5* 3.0* 3.2*  NEUTROABS 0.0* 0.1* 0.3* 0.8* 1.6*  HGB 7.0* 7.2* 7.5* 7.7* 7.0*  HCT 21.2* 21.8* 22.6* 23.0* 20.6*  MCV 91.0 90.8 90.8 89.8 89.6  PLT 26* 30* 38* 48* 40*   Basic Metabolic Panel: Recent Labs  Lab 05/19/24 0850 05/19/24 1827 05/20/24 0320 05/21/24 0310 05/22/24 0512 05/23/24 0344 05/24/24 0258  NA  --    < > 146*  140 140 143 140  K  --    < > 3.0* 3.4* 3.4* 3.3* 2.7*  CL  --    < > 115* 110 108 110 107  CO2  --    < > 22 21* 19* 21* 24  GLUCOSE  --    < > 106* 172* 65* 112* 128*  BUN  --    < > 15 11 7 9 10   CREATININE  --    < > 0.71 0.59 0.52 0.53 0.44  CALCIUM   --    < > 8.0* 7.8* 8.3* 8.4* 8.0*  MG 2.3  --  2.2 2.1  --   --  1.9   < > = values in this interval not displayed.   GFR: Estimated Creatinine Clearance: 65.7 mL/min (by C-G formula based on SCr of 0.44 mg/dL). Liver Function Tests: Recent  Labs  Lab 05/19/24 9177 05/20/24 0320 05/21/24 0310 05/22/24 0512 05/23/24 0344  AST 25 56* 106* 78* 70*  ALT 19 46* 82* 59* 48*  ALKPHOS 60 47 48 52 57  BILITOT 1.1 1.0 0.7 0.9 1.0  PROT 6.9 6.1* 5.9* 6.4* 6.7  ALBUMIN  3.4* 3.0* 2.8* 2.9* 3.1*   Recent Labs  Lab 05/18/24 1444  LIPASE 14   Recent Labs  Lab 05/18/24 1444  AMMONIA <13   Coagulation Profile: No results for input(s): INR, PROTIME in the last 168 hours. Cardiac Enzymes: No results for input(s): CKTOTAL, CKMB, CKMBINDEX, TROPONINI in the last 168 hours. BNP (last 3 results) No results for input(s): PROBNP in the last 8760 hours. HbA1C: No results for input(s): HGBA1C in the last 72 hours. CBG: Recent Labs  Lab 05/23/24 2023 05/24/24 0738 05/24/24 1147 05/24/24 1624 05/24/24 2034  GLUCAP 130* 153* 124* 144* 138*   Lipid Profile: No results for input(s): CHOL, HDL, LDLCALC, TRIG, CHOLHDL, LDLDIRECT in the last 72 hours. Thyroid  Function Tests: No results for input(s): TSH, T4TOTAL, FREET4, T3FREE, THYROIDAB in the last 72 hours. Anemia Panel: No results for input(s): VITAMINB12, FOLATE, FERRITIN, TIBC, IRON, RETICCTPCT in the last 72 hours. Sepsis Labs: Recent Labs  Lab 05/19/24 9177 05/19/24 1336 05/20/24 1237 05/20/24 1630  LATICACIDVEN 2.4* 2.3* 2.6* 2.5*    Recent Results (from the past 240 hours)  Resp panel by RT-PCR (RSV, Flu A&B, Covid)  Anterior Nasal Swab     Status: None   Collection Time: 05/18/24  2:26 PM   Specimen: Anterior Nasal Swab  Result Value Ref Range Status   SARS Coronavirus 2 by RT PCR NEGATIVE NEGATIVE Final    Comment: (NOTE) SARS-CoV-2 target nucleic acids are NOT DETECTED.  The SARS-CoV-2 RNA is generally detectable in upper respiratory specimens during the acute phase of infection. The lowest concentration of SARS-CoV-2 viral copies this assay can detect is 138 copies/mL. A negative result does not preclude SARS-Cov-2 infection and should not be used as the sole basis for treatment or other patient management decisions. A negative result may occur with  improper specimen collection/handling, submission of specimen other than nasopharyngeal swab, presence of viral mutation(s) within the areas targeted by this assay, and inadequate number of viral copies(<138 copies/mL). A negative result must be combined with clinical observations, patient history, and epidemiological information. The expected result is Negative.  Fact Sheet for Patients:  bloggercourse.com  Fact Sheet for Healthcare Providers:  seriousbroker.it  This test is no t yet approved or cleared by the [place]  FDA and  has been authorized for detection and/or diagnosis of SARS-CoV-2 by FDA under an Emergency Use Authorization (EUA). This EUA will remain  in effect (meaning this test can be used) for the duration of the COVID-19 declaration under Section 564(b)(1) of the Act, 21 U.S.C.section 360bbb-3(b)(1), unless the authorization is terminated  or revoked sooner.       Influenza A by PCR NEGATIVE NEGATIVE Final   Influenza B by PCR NEGATIVE NEGATIVE Final    Comment: (NOTE) The Xpert Xpress SARS-CoV-2/FLU/RSV plus assay is intended as an aid in the diagnosis of influenza from Nasopharyngeal swab specimens and should not be used as a sole basis for treatment. Nasal washings  and aspirates are unacceptable for Xpert Xpress SARS-CoV-2/FLU/RSV testing.  Fact Sheet for Patients: bloggercourse.com  Fact Sheet for Healthcare Providers: seriousbroker.it  This test is not yet approved or cleared by the [place]  FDA and has been authorized for detection and/or diagnosis  of SARS-CoV-2 by FDA under an Emergency Use Authorization (EUA). This EUA will remain in effect (meaning this test can be used) for the duration of the COVID-19 declaration under Section 564(b)(1) of the Act, 21 U.S.C. section 360bbb-3(b)(1), unless the authorization is terminated or revoked.     Resp Syncytial Virus by PCR NEGATIVE NEGATIVE Final    Comment: (NOTE) Fact Sheet for Patients: bloggercourse.com  Fact Sheet for Healthcare Providers: seriousbroker.it  This test is not yet approved or cleared by the [place]  FDA and has been authorized for detection and/or diagnosis of SARS-CoV-2 by FDA under an Emergency Use Authorization (EUA). This EUA will remain in effect (meaning this test can be used) for the duration of the COVID-19 declaration under Section 564(b)(1) of the Act, 21 U.S.C. section 360bbb-3(b)(1), unless the authorization is terminated or revoked.  Performed at [facility], [address] [place], [place] [address]   Blood culture (routine x 2)     Status: None   Collection Time: 05/18/24  2:47 PM   Specimen: BLOOD  Result Value Ref Range Status   Specimen Description   Final    BLOOD LEFT ANTECUBITAL Performed at [facility], [address] [place], [place] [address]    Special Requests   Final    BOTTLES DRAWN AEROBIC AND ANAEROBIC Blood Culture adequate volume Performed at [facility], [address] [place], [place] [address]    Culture   Final    NO GROWTH 5 DAYS Performed at [facility], [address], [place], [place] [address]    Report Status 05/23/2024 FINAL  Final  Urine Culture (for pregnant, neutropenic or urologic patients or patients with an indwelling urinary catheter)     Status: Abnormal   Collection Time: 05/18/24  3:18 PM   Specimen: In/Out Cath Urine  Result Value Ref Range Status   Specimen Description   Final    IN/OUT CATH URINE Performed at [facility], [address] [place], [place] [address]    Special Requests   Final    NONE Performed at [facility], [address] [place], [place] [address]    Culture (A)  Final    <10,000 COLONIES/mL INSIGNIFICANT GROWTH Performed at [facility], [address], [place], [place] [address]    Report Status 05/20/2024 FINAL  Final  Culture, blood (Routine X 2) w Reflex to ID Panel     Status: None   Collection Time: 05/19/24 10:25 AM   Specimen: BLOOD RIGHT HAND  Result Value Ref Range Status   Specimen Description   Final    BLOOD RIGHT HAND Performed at [facility], [address], [place], [place] [address]    Special Requests   Final    BOTTLES DRAWN AEROBIC ONLY Blood Culture results may not be optimal due to an inadequate volume of blood received in culture bottles Performed at [facility], [address] [place], [place] [address]    Culture   Final    NO GROWTH 5 DAYS Performed at [facility], [address], [place], [place] [address]    Report Status 05/24/2024 FINAL  Final         Radiology Studies: No results found.      Scheduled Meds:  amLODipine   5 mg Oral Daily   chlorhexidine   10 mL Mouth/Throat QID   Chlorhexidine   Gluconate Cloth  6 each Topical Daily   feeding supplement  1 Container Oral TID BM   insulin  aspart  0-5 Units Subcutaneous QHS   insulin  aspart  0-6 Units Subcutaneous TID WC   magic mouthwash w/lidocaine   5 mL Oral QID   mouth rinse  15 mL Mouth Rinse 4 times per day   sodium chloride  flush  10-40 mL  Intracatheter Q12H   Continuous Infusions:  ceFEPime  (MAXIPIME ) IV 2 g (05/25/24 0045)          Sophie Mao, MD [facility] 05/25/2024, 7:36 AM   "

## 2024-05-26 ENCOUNTER — Inpatient Hospital Stay

## 2024-05-26 DIAGNOSIS — D709 Neutropenia, unspecified: Secondary | ICD-10-CM | POA: Diagnosis not present

## 2024-05-26 DIAGNOSIS — R5081 Fever presenting with conditions classified elsewhere: Secondary | ICD-10-CM | POA: Diagnosis not present

## 2024-05-26 LAB — BASIC METABOLIC PANEL WITH GFR
Anion gap: 9 (ref 5–15)
BUN: 8 mg/dL (ref 6–20)
CO2: 27 mmol/L (ref 22–32)
Calcium: 8.1 mg/dL — ABNORMAL LOW (ref 8.9–10.3)
Chloride: 101 mmol/L (ref 98–111)
Creatinine, Ser: 0.39 mg/dL — ABNORMAL LOW (ref 0.44–1.00)
GFR, Estimated: >60 mL/min
Glucose, Bld: 110 mg/dL — ABNORMAL HIGH (ref 70–99)
Potassium: 2.4 mmol/L — CL (ref 3.5–5.1)
Sodium: 137 mmol/L (ref 135–145)

## 2024-05-26 LAB — CBC WITH DIFFERENTIAL/PLATELET
Abs Immature Granulocytes: 0.04 K/uL (ref 0.00–0.07)
Basophils Absolute: 0 K/uL (ref 0.0–0.1)
Basophils Relative: 0 %
Eosinophils Absolute: 0.1 K/uL (ref 0.0–0.5)
Eosinophils Relative: 3 %
HCT: 24.7 % — ABNORMAL LOW (ref 36.0–46.0)
Hemoglobin: 8.8 g/dL — ABNORMAL LOW (ref 12.0–15.0)
Immature Granulocytes: 2 %
Lymphocytes Relative: 82 %
Lymphs Abs: 2.2 K/uL (ref 0.7–4.0)
MCH: 30.4 pg (ref 26.0–34.0)
MCHC: 35.6 g/dL (ref 30.0–36.0)
MCV: 85.5 fL (ref 80.0–100.0)
Monocytes Absolute: 0.1 K/uL (ref 0.1–1.0)
Monocytes Relative: 3 %
Neutro Abs: 0.3 K/uL — CL (ref 1.7–7.7)
Neutrophils Relative %: 10 %
Platelets: 19 K/uL — CL (ref 150–400)
RBC: 2.89 MIL/uL — ABNORMAL LOW (ref 3.87–5.11)
RDW: 18 % — ABNORMAL HIGH (ref 11.5–15.5)
WBC: 2.7 K/uL — ABNORMAL LOW (ref 4.0–10.5)
nRBC: 0.7 % — ABNORMAL HIGH (ref 0.0–0.2)

## 2024-05-26 LAB — GLUCOSE, CAPILLARY
Glucose-Capillary: 124 mg/dL — ABNORMAL HIGH (ref 70–99)
Glucose-Capillary: 138 mg/dL — ABNORMAL HIGH (ref 70–99)
Glucose-Capillary: 131 mg/dL — ABNORMAL HIGH (ref 70–99)
Glucose-Capillary: 174 mg/dL — ABNORMAL HIGH (ref 70–99)

## 2024-05-26 LAB — MAGNESIUM: Magnesium: 2 mg/dL (ref 1.7–2.4)

## 2024-05-26 MED ORDER — POTASSIUM CHLORIDE 10 MEQ/100ML IV SOLN
10.0000 meq | INTRAVENOUS | Status: AC
  Administered 2024-05-26 (×6): 10 meq via INTRAVENOUS
  Filled 2024-05-26 (×5): qty 100

## 2024-05-26 MED ORDER — POTASSIUM CHLORIDE 10 MEQ/100ML IV SOLN
INTRAVENOUS | Status: AC
  Filled 2024-05-26: qty 100

## 2024-05-26 MED ORDER — POTASSIUM CHLORIDE CRYS ER 20 MEQ PO TBCR
40.0000 meq | EXTENDED_RELEASE_TABLET | Freq: Four times a day (QID) | ORAL | Status: AC
  Administered 2024-05-26 (×2): 40 meq via ORAL
  Filled 2024-05-26 (×2): qty 2

## 2024-05-26 NOTE — Progress Notes (Signed)
 " PROGRESS NOTE    Susan Whitaker  FMW:[id number] DOB: 06-Feb-1971 DOA: 05/18/2024 PCP: Katheen Roselie Rockford, NP   Brief Narrative:  54 y.o. female with medical history significant for well-controlled type 2 diabetes, GERD, hypertension, hyperlipidemia, gastric adenocarcinoma on neoadjuvant chemotherapy presented with weakness, lethargy, poor oral intake due to mucositis with nausea and is currently admitted for neutropenic fever and mucositis.  She was treated with broad-spectrum antibiotics.  Oncology following.  PT recommending SNF placement.  Patient/family not interested in SNF placement.  Assessment & Plan:   Neutropenic fever Pancytopenia Stage Ib gastric adenocarcinoma status post recent chemotherapy Mucositis Severe sepsis: Present on admission -Oncology signed off on 05/24/2024 and recommended outpatient oncology follow-up: filgrastim  discontinued on 05/24/2024 as neutropenia has resolved but was restarted on 05/25/2024 by oncology due to neutropenia again.  Neutropenic today as well.  Hemoglobin 8.8 today: Received 1 unit packed red cell transfusion on 05/25/2024.  Monitor CBC daily. -Platelets 19 today.  No signs of bleeding.  Transfuse if platelets less than 10 or if there is evidence of active bleeding.  - Sepsis has resolved.  No temperature spikes over the last 72 hours.  Blood cultures have remained negative so far.  Urine cultures showed insignificant growth.  Completed 7 days course of cefepime . -Continue miracle mouthwash as needed.  Oral intake improving.  Diet as tolerated  Hypernatremia Dehydration - Hypernatremia is resolved. -IV fluids discontinued on 05/24/2024.  Encourage oral intake  Acute metabolic acidosis - Resolved  Elevated LFTs - Questionable cause.  Improving.  Monitor intermittently  Hypokalemia - Replace.  Repeat a.m. labs.  History of diet-controlled diabetes mellitus type 2 with hypoglycemia - Blood sugars improving.  Off of IV fluids  Physical  deconditioning -PT recommending SNF placement.  TOC following.  Patient declining SNF.  Will need home health PT  DVT prophylaxis: None due to thrombocytopenia Code Status: Full Family Communication: daughter at bedside Disposition Plan: Status is: Inpatient Remains inpatient appropriate because: Of severity of illness    Consultants: Oncology  Procedures: None  Antimicrobials:  Anti-infectives (From admission, onward)    Start     Dose/Rate Route Frequency Ordered Stop   05/19/24 1900  vancomycin  (VANCOCIN ) IVPB 1000 mg/200 mL premix  Status:  Discontinued        1,000 mg 200 mL/hr over 60 Minutes Intravenous Every 24 hours 05/18/24 1919 05/21/24 1200   05/19/24 1600  ceFEPIme  (MAXIPIME ) 2 g in sodium chloride  0.9 % 100 mL IVPB        2 g 200 mL/hr over 30 Minutes Intravenous Every 8 hours 05/19/24 1123 05/25/24 1624   05/19/24 1000  metroNIDAZOLE  (FLAGYL ) IVPB 500 mg  Status:  Discontinued        500 mg 100 mL/hr over 60 Minutes Intravenous Every 12 hours 05/19/24 0815 05/21/24 1200   05/19/24 0500  ceFEPIme  (MAXIPIME ) 2 g in sodium chloride  0.9 % 100 mL IVPB  Status:  Discontinued        2 g 200 mL/hr over 30 Minutes Intravenous Every 12 hours 05/18/24 1919 05/19/24 1123   05/18/24 1930  vancomycin  (VANCOCIN ) IVPB 1000 mg/200 mL premix       Placed in Followed by Linked Group   1,000 mg 200 mL/hr over 60 Minutes Intravenous  Once 05/18/24 1920 05/18/24 2149   05/18/24 1930  vancomycin  (VANCOCIN ) 500 mg in sodium chloride  0.9 % 100 mL IVPB       Placed in Followed by Linked Group   500 mg 100  mL/hr over 60 Minutes Intravenous  Once 05/18/24 1920 05/18/24 2152   05/18/24 1630  ceFEPIme  (MAXIPIME ) 2 g in sodium chloride  0.9 % 100 mL IVPB        2 g 200 mL/hr over 30 Minutes Intravenous  Once 05/18/24 1627 05/18/24 1749   05/18/24 1630  metroNIDAZOLE  (FLAGYL ) IVPB 500 mg        500 mg 100 mL/hr over 60 Minutes Intravenous  Once 05/18/24 1627 05/18/24 1854   05/18/24  1630  vancomycin  (VANCOCIN ) IVPB 1000 mg/200 mL premix  Status:  Discontinued        1,000 mg 200 mL/hr over 60 Minutes Intravenous  Once 05/18/24 1627 05/18/24 1920         Subjective: Patient seen and examined at bedside.  Oral intake is improving.  Continues to have intermittent mild pain.  Denies worsening shortness of breath, abdominal pain, vomiting. Objective: Vitals:   05/25/24 1630 05/25/24 2026 05/26/24 0545 05/26/24 1005  BP: 121/66 118/65 (!) 98/55 (!) 90/56  Pulse: 65 69 62 61  Resp: 15 16    Temp: 98.1 F (36.7 C) 98.5 F (36.9 C) 98.1 F (36.7 C)   TempSrc: Oral Oral Oral   SpO2: 100% 100% 100%   Weight:      Height:        Intake/Output Summary (Last 24 hours) at 05/26/2024 1105 Last data filed at 05/26/2024 0524 Gross per 24 hour  Intake 892 ml  Output 1550 ml  Net -658 ml   Filed Weights   05/18/24 1420 05/19/24 1323  Weight: 58.8 kg 58 kg    Examination:  General: On room air.  No distress.  Chronically ill and deconditioned appearing. ENT/neck: No thyromegaly or elevated JVD noted respiratory: Bilateral decreased breath sounds at bases with some crackles CVS: S1-S2 heard; rate mostly controlled abdominal: Soft, nontender, remains slightly distended; no organomegaly, normal bowel sounds heard  extremities: Trace lower extremity edema present; no cyanosis CNS: Awake and alert.  No obvious focal neurologic deficit.   Lymph: No obvious lymphadenopathy noted  skin: No obvious lesions/ecchymosis psych: Affect is mostly flat  Musculoskeletal: No obvious joint swelling/deformity    Data Reviewed: I have personally reviewed following labs and imaging studies  CBC: Recent Labs  Lab 05/22/24 0512 05/23/24 0344 05/24/24 0258 05/25/24 0809 05/25/24 1800 05/26/24 0330  WBC 1.5* 3.0* 3.2* 3.0*  --  2.7*  NEUTROABS 0.3* 0.8* 1.6* 0.4*  --  0.3*  HGB 7.5* 7.7* 7.0* 7.1* 9.1* 8.8*  HCT 22.6* 23.0* 20.6* 20.4* 25.5* 24.7*  MCV 90.8 89.8 89.6 87.9   --  85.5  PLT 38* 48* 40* 29*  --  19*   Basic Metabolic Panel: Recent Labs  Lab 05/20/24 0320 05/21/24 0310 05/22/24 0512 05/23/24 0344 05/24/24 0258 05/25/24 0809 05/26/24 0330  NA 146* 140 140 143 140 143 137  K 3.0* 3.4* 3.4* 3.3* 2.7* 3.6 2.4*  CL 115* 110 108 110 107 111 101  CO2 22 21* 19* 21* 24 24 27   GLUCOSE 106* 172* 65* 112* 128* 198* 110*  BUN 15 11 7 9 10 11 8   CREATININE 0.71 0.59 0.52 0.53 0.44 0.42* 0.39*  CALCIUM  8.0* 7.8* 8.3* 8.4* 8.0* 7.8* 8.1*  MG 2.2 2.1  --   --  1.9 2.0 2.0   GFR: Estimated Creatinine Clearance: 65.7 mL/min (A) (by C-G formula based on SCr of 0.39 mg/dL (L)). Liver Function Tests: Recent Labs  Lab 05/20/24 0320 05/21/24 0310 05/22/24 9487 05/23/24 0344  05/25/24 0809  AST 56* 106* 78* 70* 46*  ALT 46* 82* 59* 48* 32  ALKPHOS 47 48 52 57 49  BILITOT 1.0 0.7 0.9 1.0 0.8  PROT 6.1* 5.9* 6.4* 6.7 6.2*  ALBUMIN  3.0* 2.8* 2.9* 3.1* 2.9*   No results for input(s): LIPASE, AMYLASE in the last 168 hours.  No results for input(s): AMMONIA in the last 168 hours.  Coagulation Profile: No results for input(s): INR, PROTIME in the last 168 hours. Cardiac Enzymes: No results for input(s): CKTOTAL, CKMB, CKMBINDEX, TROPONINI in the last 168 hours. BNP (last 3 results) No results for input(s): PROBNP in the last 8760 hours. HbA1C: No results for input(s): HGBA1C in the last 72 hours. CBG: Recent Labs  Lab 05/25/24 0738 05/25/24 1124 05/25/24 1642 05/25/24 2130 05/26/24 0749  GLUCAP 123* 131* 135* 148* 124*   Lipid Profile: No results for input(s): CHOL, HDL, LDLCALC, TRIG, CHOLHDL, LDLDIRECT in the last 72 hours. Thyroid  Function Tests: No results for input(s): TSH, T4TOTAL, FREET4, T3FREE, THYROIDAB in the last 72 hours. Anemia Panel: No results for input(s): VITAMINB12, FOLATE, FERRITIN, TIBC, IRON, RETICCTPCT in the last 72 hours. Sepsis Labs: Recent Labs  Lab  05/19/24 1336 05/20/24 1237 05/20/24 1630  LATICACIDVEN 2.3* 2.6* 2.5*    Recent Results (from the past 240 hours)  Resp panel by RT-PCR (RSV, Flu A&B, Covid) Anterior Nasal Swab     Status: None   Collection Time: 05/18/24  2:26 PM   Specimen: Anterior Nasal Swab  Result Value Ref Range Status   SARS Coronavirus 2 by RT PCR NEGATIVE NEGATIVE Final    Comment: (NOTE) SARS-CoV-2 target nucleic acids are NOT DETECTED.  The SARS-CoV-2 RNA is generally detectable in upper respiratory specimens during the acute phase of infection. The lowest concentration of SARS-CoV-2 viral copies this assay can detect is 138 copies/mL. A negative result does not preclude SARS-Cov-2 infection and should not be used as the sole basis for treatment or other patient management decisions. A negative result may occur with  improper specimen collection/handling, submission of specimen other than nasopharyngeal swab, presence of viral mutation(s) within the areas targeted by this assay, and inadequate number of viral copies(<138 copies/mL). A negative result must be combined with clinical observations, patient history, and epidemiological information. The expected result is Negative.  Fact Sheet for Patients:  bloggercourse.com  Fact Sheet for Healthcare Providers:  seriousbroker.it  This test is no t yet approved or cleared by the [place]  FDA and  has been authorized for detection and/or diagnosis of SARS-CoV-2 by FDA under an Emergency Use Authorization (EUA). This EUA will remain  in effect (meaning this test can be used) for the duration of the COVID-19 declaration under Section 564(b)(1) of the Act, 21 U.S.C.section 360bbb-3(b)(1), unless the authorization is terminated  or revoked sooner.       Influenza A by PCR NEGATIVE NEGATIVE Final   Influenza B by PCR NEGATIVE NEGATIVE Final    Comment: (NOTE) The Xpert Xpress SARS-CoV-2/FLU/RSV  plus assay is intended as an aid in the diagnosis of influenza from Nasopharyngeal swab specimens and should not be used as a sole basis for treatment. Nasal washings and aspirates are unacceptable for Xpert Xpress SARS-CoV-2/FLU/RSV testing.  Fact Sheet for Patients: bloggercourse.com  Fact Sheet for Healthcare Providers: seriousbroker.it  This test is not yet approved or cleared by the [place]  FDA and has been authorized for detection and/or diagnosis of SARS-CoV-2 by FDA under an Emergency Use Authorization (EUA). This EUA will  remain in effect (meaning this test can be used) for the duration of the COVID-19 declaration under Section 564(b)(1) of the Act, 21 U.S.C. section 360bbb-3(b)(1), unless the authorization is terminated or revoked.     Resp Syncytial Virus by PCR NEGATIVE NEGATIVE Final    Comment: (NOTE) Fact Sheet for Patients: bloggercourse.com  Fact Sheet for Healthcare Providers: seriousbroker.it  This test is not yet approved or cleared by the [place]  FDA and has been authorized for detection and/or diagnosis of SARS-CoV-2 by FDA under an Emergency Use Authorization (EUA). This EUA will remain in effect (meaning this test can be used) for the duration of the COVID-19 declaration under Section 564(b)(1) of the Act, 21 U.S.C. section 360bbb-3(b)(1), unless the authorization is terminated or revoked.  Performed at [facility], [address] [place], [place] [address]   Blood culture (routine x 2)     Status: None   Collection Time: 05/18/24  2:47 PM   Specimen: BLOOD  Result Value Ref Range Status   Specimen Description   Final    BLOOD LEFT ANTECUBITAL Performed at [facility], [address] [place], [place] [address]    Special Requests   Final    BOTTLES DRAWN AEROBIC AND ANAEROBIC Blood Culture adequate  volume Performed at [facility], [address] [place], [place] [address]    Culture   Final    NO GROWTH 5 DAYS Performed at [facility], [address] [place], [place] [address]    Report Status 05/23/2024 FINAL  Final  Urine Culture (for pregnant, neutropenic or urologic patients or patients with an indwelling urinary catheter)     Status: Abnormal   Collection Time: 05/18/24  3:18 PM   Specimen: In/Out Cath Urine  Result Value Ref Range Status   Specimen Description   Final    IN/OUT CATH URINE Performed at [facility], [address] [place], [place] [address]    Special Requests   Final    NONE Performed at [facility], [address] [place], [place] [address]    Culture (A)  Final    <10,000 COLONIES/mL INSIGNIFICANT GROWTH Performed at [facility], [address] [place], [place] [address]    Report Status 05/20/2024 FINAL  Final  Culture, blood (Routine X 2) w Reflex to ID Panel     Status: None   Collection Time: 05/19/24 10:25 AM   Specimen: BLOOD RIGHT HAND  Result Value Ref Range Status   Specimen Description   Final    BLOOD RIGHT HAND Performed at [facility], [name], [place], [place] [address]    Special Requests   Final    BOTTLES DRAWN AEROBIC ONLY Blood Culture results may not be optimal due to an inadequate volume of blood received in culture bottles Performed at [facility], [address] [place], [place] [address]    Culture   Final    NO GROWTH 5 DAYS Performed at [facility], [address], [place], [place] [address]    Report Status 05/24/2024 FINAL  Final         Radiology Studies: No results found.      Scheduled Meds:  amLODipine   5 mg Oral Daily   chlorhexidine   10 mL Mouth/Throat QID   Chlorhexidine  Gluconate Cloth  6 each Topical Daily   feeding supplement  1  Container Oral TID BM   filgrastim  (NIVESTYM ) SQ  300 mcg  Subcutaneous Daily   insulin  aspart  0-5 Units Subcutaneous QHS   insulin  aspart  0-6 Units Subcutaneous TID WC   magic mouthwash w/lidocaine   5 mL Oral QID   mouth rinse  15 mL Mouth Rinse 4 times per day   potassium chloride   40 mEq Oral Q6H   sodium chloride  flush  10-40 mL Intracatheter Q12H   Continuous Infusions:  potassium chloride  10 mEq (05/26/24 1002)          Sophie Mao, MD [facility] 05/26/2024, 11:05 AM   "

## 2024-05-27 DIAGNOSIS — R5081 Fever presenting with conditions classified elsewhere: Secondary | ICD-10-CM | POA: Diagnosis not present

## 2024-05-27 DIAGNOSIS — D709 Neutropenia, unspecified: Secondary | ICD-10-CM | POA: Diagnosis not present

## 2024-05-27 LAB — CBC WITH DIFFERENTIAL/PLATELET
Abs Immature Granulocytes: 0.02 K/uL (ref 0.00–0.07)
Basophils Absolute: 0 K/uL (ref 0.0–0.1)
Basophils Relative: 1 %
Eosinophils Absolute: 0.1 K/uL (ref 0.0–0.5)
Eosinophils Relative: 3 %
HCT: 24.9 % — ABNORMAL LOW (ref 36.0–46.0)
Hemoglobin: 9 g/dL — ABNORMAL LOW (ref 12.0–15.0)
Immature Granulocytes: 1 %
Lymphocytes Relative: 86 %
Lymphs Abs: 3 K/uL (ref 0.7–4.0)
MCH: 30.9 pg (ref 26.0–34.0)
MCHC: 36.1 g/dL — ABNORMAL HIGH (ref 30.0–36.0)
MCV: 85.6 fL (ref 80.0–100.0)
Monocytes Absolute: 0.2 K/uL (ref 0.1–1.0)
Monocytes Relative: 5 %
Neutro Abs: 0.2 K/uL — CL (ref 1.7–7.7)
Neutrophils Relative %: 4 %
Platelets: 23 K/uL — CL (ref 150–400)
RBC: 2.91 MIL/uL — ABNORMAL LOW (ref 3.87–5.11)
RDW: 17.7 % — ABNORMAL HIGH (ref 11.5–15.5)
WBC: 3.5 K/uL — ABNORMAL LOW (ref 4.0–10.5)
nRBC: 2 % — ABNORMAL HIGH (ref 0.0–0.2)

## 2024-05-27 LAB — GLUCOSE, CAPILLARY
Glucose-Capillary: 139 mg/dL — ABNORMAL HIGH (ref 70–99)
Glucose-Capillary: 109 mg/dL — ABNORMAL HIGH (ref 70–99)
Glucose-Capillary: 128 mg/dL — ABNORMAL HIGH (ref 70–99)
Glucose-Capillary: 142 mg/dL — ABNORMAL HIGH (ref 70–99)

## 2024-05-27 LAB — COMPREHENSIVE METABOLIC PANEL WITH GFR
ALT: 21 U/L (ref 0–44)
AST: 25 U/L (ref 15–41)
Albumin: 3.1 g/dL — ABNORMAL LOW (ref 3.5–5.0)
Alkaline Phosphatase: 50 U/L (ref 38–126)
Anion gap: 7 (ref 5–15)
BUN: 9 mg/dL (ref 6–20)
CO2: 28 mmol/L (ref 22–32)
Calcium: 8.3 mg/dL — ABNORMAL LOW (ref 8.9–10.3)
Chloride: 101 mmol/L (ref 98–111)
Creatinine, Ser: 0.43 mg/dL — ABNORMAL LOW (ref 0.44–1.00)
GFR, Estimated: >60 mL/min
Glucose, Bld: 108 mg/dL — ABNORMAL HIGH (ref 70–99)
Potassium: 2.9 mmol/L — ABNORMAL LOW (ref 3.5–5.1)
Sodium: 137 mmol/L (ref 135–145)
Total Bilirubin: 0.9 mg/dL (ref 0.0–1.2)
Total Protein: 6.1 g/dL — ABNORMAL LOW (ref 6.5–8.1)

## 2024-05-27 LAB — MAGNESIUM: Magnesium: 2 mg/dL (ref 1.7–2.4)

## 2024-05-27 MED ORDER — POTASSIUM CHLORIDE 10 MEQ/100ML IV SOLN
10.0000 meq | INTRAVENOUS | Status: AC
  Administered 2024-05-27 (×6): 10 meq via INTRAVENOUS
  Filled 2024-05-27 (×6): qty 100

## 2024-05-27 MED ORDER — POTASSIUM CHLORIDE CRYS ER 20 MEQ PO TBCR
40.0000 meq | EXTENDED_RELEASE_TABLET | Freq: Two times a day (BID) | ORAL | Status: DC
  Administered 2024-05-27 – 2024-05-28 (×3): 40 meq via ORAL
  Filled 2024-05-27 (×3): qty 2

## 2024-05-27 NOTE — Progress Notes (Signed)
 " PROGRESS NOTE    Susan Whitaker  FMW:[id number] DOB: October 31, 1970 DOA: 05/18/2024 PCP: Katheen Roselie Rockford, NP   Brief Narrative:  54 y.o. female with medical history significant for well-controlled type 2 diabetes, GERD, hypertension, hyperlipidemia, gastric adenocarcinoma on neoadjuvant chemotherapy presented with weakness, lethargy, poor oral intake due to mucositis with nausea and is currently admitted for neutropenic fever and mucositis.  She was treated with broad-spectrum antibiotics.  Oncology following.  PT recommending SNF placement.  Patient/family not interested in SNF placement.  Assessment & Plan:   Neutropenic fever Pancytopenia Stage Ib gastric adenocarcinoma status post recent chemotherapy Mucositis Severe sepsis: Present on admission -Oncology signed off on 05/24/2024 and recommended outpatient oncology follow-up: filgrastim  discontinued on 05/24/2024 as neutropenia has resolved but was restarted on 05/25/2024 by oncology due to neutropenia again.  Neutropenic today as well.  Hemoglobin 9 today: Received 1 unit packed red cell transfusion on 05/25/2024.  Monitor CBC daily. -Platelets 23 today.  No signs of bleeding.  Transfuse if platelets less than 10 or if there is evidence of active bleeding.  - Sepsis has resolved.  No temperature spikes over the last several days.  Blood cultures have remained negative so far.  Urine cultures showed insignificant growth.  Completed 7 days course of cefepime . -Continue miracle mouthwash as needed.  Oral intake improving.  Diet as tolerated  Hypernatremia Dehydration - Hypernatremia is resolved. -IV fluids discontinued on 05/24/2024.  Encourage oral intake  Acute metabolic acidosis - Resolved  Elevated LFTs - Questionable cause.  Improving.  Monitor intermittently  Hypokalemia - Replace.  Repeat a.m. labs.  History of diet-controlled diabetes mellitus type 2 with hypoglycemia - Blood sugars improving.  Off of IV fluids  Physical  deconditioning -PT recommending SNF placement.  TOC following.  Patient declining SNF.  Will need home health PT  Severe malnutrition - Follow nutrition recommendations  DVT prophylaxis: None due to thrombocytopenia Code Status: Full Family Communication: daughter at bedside Disposition Plan: Status is: Inpatient Remains inpatient appropriate because: Of severity of illness.  Continued neutropenia    Consultants: Oncology  Procedures: None  Antimicrobials:  Anti-infectives (From admission, onward)    Start     Dose/Rate Route Frequency Ordered Stop   05/19/24 1900  vancomycin  (VANCOCIN ) IVPB 1000 mg/200 mL premix  Status:  Discontinued        1,000 mg 200 mL/hr over 60 Minutes Intravenous Every 24 hours 05/18/24 1919 05/21/24 1200   05/19/24 1600  ceFEPIme  (MAXIPIME ) 2 g in sodium chloride  0.9 % 100 mL IVPB        2 g 200 mL/hr over 30 Minutes Intravenous Every 8 hours 05/19/24 1123 05/25/24 1624   05/19/24 1000  metroNIDAZOLE  (FLAGYL ) IVPB 500 mg  Status:  Discontinued        500 mg 100 mL/hr over 60 Minutes Intravenous Every 12 hours 05/19/24 0815 05/21/24 1200   05/19/24 0500  ceFEPIme  (MAXIPIME ) 2 g in sodium chloride  0.9 % 100 mL IVPB  Status:  Discontinued        2 g 200 mL/hr over 30 Minutes Intravenous Every 12 hours 05/18/24 1919 05/19/24 1123   05/18/24 1930  vancomycin  (VANCOCIN ) IVPB 1000 mg/200 mL premix       Placed in Followed by Linked Group   1,000 mg 200 mL/hr over 60 Minutes Intravenous  Once 05/18/24 1920 05/18/24 2149   05/18/24 1930  vancomycin  (VANCOCIN ) 500 mg in sodium chloride  0.9 % 100 mL IVPB       Placed  in Followed by Linked Group   500 mg 100 mL/hr over 60 Minutes Intravenous  Once 05/18/24 1920 05/18/24 2152   05/18/24 1630  ceFEPIme  (MAXIPIME ) 2 g in sodium chloride  0.9 % 100 mL IVPB        2 g 200 mL/hr over 30 Minutes Intravenous  Once 05/18/24 1627 05/18/24 1749   05/18/24 1630  metroNIDAZOLE  (FLAGYL ) IVPB 500 mg        500  mg 100 mL/hr over 60 Minutes Intravenous  Once 05/18/24 1627 05/18/24 1854   05/18/24 1630  vancomycin  (VANCOCIN ) IVPB 1000 mg/200 mL premix  Status:  Discontinued        1,000 mg 200 mL/hr over 60 Minutes Intravenous  Once 05/18/24 1627 05/18/24 1920         Subjective: Patient seen and examined at bedside.  No fever, vomiting, worsening abdominal pain reported.   Objective: Vitals:   05/26/24 1005 05/26/24 1438 05/26/24 2022 05/27/24 0518  BP: (!) 90/56 (!) 93/56 (!) 100/55 109/61  Pulse: 61 63 66 (!) 59  Resp:  16    Temp:  98.1 F (36.7 C) 98.4 F (36.9 C) 98 F (36.7 C)  TempSrc:  Oral Oral Oral  SpO2:  100% 100% 100%  Weight:      Height:        Intake/Output Summary (Last 24 hours) at 05/27/2024 0805 Last data filed at 05/27/2024 0622 Gross per 24 hour  Intake 1200 ml  Output 700 ml  Net 500 ml   Filed Weights   05/18/24 1420 05/19/24 1323  Weight: 58.8 kg 58 kg    Examination:  General: No acute distress.  Currently on room air.  Chronically ill and deconditioned appearing. ENT/neck: No JVD elevation or palpable neck masses noted  respiratory: Decreased breath sounds at bases bilaterally with scattered crackles CVS: Currently rate controlled; S1 and S2 heard  abdominal: Soft, nontender, still slightly distended; no organomegaly, bowel sounds are normally heard Extremities: No clubbing; mild lower extremity edema present CNS: Alert and oriented.  No focal deficits Lymph: No palpable lymphadenopathy  skin: No obvious petechiae/rashes  psych: Flat affect.   Musculoskeletal: No obvious tenderness/erythema    Data Reviewed: I have personally reviewed following labs and imaging studies  CBC: Recent Labs  Lab 05/23/24 0344 05/24/24 0258 05/25/24 0809 05/25/24 1800 05/26/24 0330 05/27/24 0303  WBC 3.0* 3.2* 3.0*  --  2.7* 3.5*  NEUTROABS 0.8* 1.6* 0.4*  --  0.3* 0.2*  HGB 7.7* 7.0* 7.1* 9.1* 8.8* 9.0*  HCT 23.0* 20.6* 20.4* 25.5* 24.7* 24.9*  MCV  89.8 89.6 87.9  --  85.5 85.6  PLT 48* 40* 29*  --  19* 23*   Basic Metabolic Panel: Recent Labs  Lab 05/21/24 0310 05/22/24 0512 05/23/24 0344 05/24/24 0258 05/25/24 0809 05/26/24 0330 05/27/24 0303  NA 140   < > 143 140 143 137 137  K 3.4*   < > 3.3* 2.7* 3.6 2.4* 2.9*  CL 110   < > 110 107 111 101 101  CO2 21*   < > 21* 24 24 27 28   GLUCOSE 172*   < > 112* 128* 198* 110* 108*  BUN 11   < > 9 10 11 8 9   CREATININE 0.59   < > 0.53 0.44 0.42* 0.39* 0.43*  CALCIUM  7.8*   < > 8.4* 8.0* 7.8* 8.1* 8.3*  MG 2.1  --   --  1.9 2.0 2.0 2.0   < > = values in  this interval not displayed.   GFR: Estimated Creatinine Clearance: 65.7 mL/min (A) (by C-G formula based on SCr of 0.43 mg/dL (L)). Liver Function Tests: Recent Labs  Lab 05/21/24 0310 05/22/24 0512 05/23/24 0344 05/25/24 0809 05/27/24 0303  AST 106* 78* 70* 46* 25  ALT 82* 59* 48* 32 21  ALKPHOS 48 52 57 49 50  BILITOT 0.7 0.9 1.0 0.8 0.9  PROT 5.9* 6.4* 6.7 6.2* 6.1*  ALBUMIN  2.8* 2.9* 3.1* 2.9* 3.1*   No results for input(s): LIPASE, AMYLASE in the last 168 hours.  No results for input(s): AMMONIA in the last 168 hours.  Coagulation Profile: No results for input(s): INR, PROTIME in the last 168 hours. Cardiac Enzymes: No results for input(s): CKTOTAL, CKMB, CKMBINDEX, TROPONINI in the last 168 hours. BNP (last 3 results) No results for input(s): PROBNP in the last 8760 hours. HbA1C: No results for input(s): HGBA1C in the last 72 hours. CBG: Recent Labs  Lab 05/26/24 0749 05/26/24 1213 05/26/24 1658 05/26/24 2139 05/27/24 0752  GLUCAP 124* 138* 131* 174* 139*   Lipid Profile: No results for input(s): CHOL, HDL, LDLCALC, TRIG, CHOLHDL, LDLDIRECT in the last 72 hours. Thyroid  Function Tests: No results for input(s): TSH, T4TOTAL, FREET4, T3FREE, THYROIDAB in the last 72 hours. Anemia Panel: No results for input(s): VITAMINB12, FOLATE, FERRITIN, TIBC,  IRON, RETICCTPCT in the last 72 hours. Sepsis Labs: Recent Labs  Lab 05/20/24 1237 05/20/24 1630  LATICACIDVEN 2.6* 2.5*    Recent Results (from the past 240 hours)  Resp panel by RT-PCR (RSV, Flu A&B, Covid) Anterior Nasal Swab     Status: None   Collection Time: 05/18/24  2:26 PM   Specimen: Anterior Nasal Swab  Result Value Ref Range Status   SARS Coronavirus 2 by RT PCR NEGATIVE NEGATIVE Final    Comment: (NOTE) SARS-CoV-2 target nucleic acids are NOT DETECTED.  The SARS-CoV-2 RNA is generally detectable in upper respiratory specimens during the acute phase of infection. The lowest concentration of SARS-CoV-2 viral copies this assay can detect is 138 copies/mL. A negative result does not preclude SARS-Cov-2 infection and should not be used as the sole basis for treatment or other patient management decisions. A negative result may occur with  improper specimen collection/handling, submission of specimen other than nasopharyngeal swab, presence of viral mutation(s) within the areas targeted by this assay, and inadequate number of viral copies(<138 copies/mL). A negative result must be combined with clinical observations, patient history, and epidemiological information. The expected result is Negative.  Fact Sheet for Patients:  bloggercourse.com  Fact Sheet for Healthcare Providers:  seriousbroker.it  This test is no t yet approved or cleared by the [place]  FDA and  has been authorized for detection and/or diagnosis of SARS-CoV-2 by FDA under an Emergency Use Authorization (EUA). This EUA will remain  in effect (meaning this test can be used) for the duration of the COVID-19 declaration under Section 564(b)(1) of the Act, 21 U.S.C.section 360bbb-3(b)(1), unless the authorization is terminated  or revoked sooner.       Influenza A by PCR NEGATIVE NEGATIVE Final   Influenza B by PCR NEGATIVE NEGATIVE Final     Comment: (NOTE) The Xpert Xpress SARS-CoV-2/FLU/RSV plus assay is intended as an aid in the diagnosis of influenza from Nasopharyngeal swab specimens and should not be used as a sole basis for treatment. Nasal washings and aspirates are unacceptable for Xpert Xpress SARS-CoV-2/FLU/RSV testing.  Fact Sheet for Patients: bloggercourse.com  Fact Sheet for Healthcare Providers: seriousbroker.it  This test is not yet approved or cleared by the [place]  FDA and has been authorized for detection and/or diagnosis of SARS-CoV-2 by FDA under an Emergency Use Authorization (EUA). This EUA will remain in effect (meaning this test can be used) for the duration of the COVID-19 declaration under Section 564(b)(1) of the Act, 21 U.S.C. section 360bbb-3(b)(1), unless the authorization is terminated or revoked.     Resp Syncytial Virus by PCR NEGATIVE NEGATIVE Final    Comment: (NOTE) Fact Sheet for Patients: bloggercourse.com  Fact Sheet for Healthcare Providers: seriousbroker.it  This test is not yet approved or cleared by the [place]  FDA and has been authorized for detection and/or diagnosis of SARS-CoV-2 by FDA under an Emergency Use Authorization (EUA). This EUA will remain in effect (meaning this test can be used) for the duration of the COVID-19 declaration under Section 564(b)(1) of the Act, 21 U.S.C. section 360bbb-3(b)(1), unless the authorization is terminated or revoked.  Performed at [facility], [address] [place], [place] [address]   Blood culture (routine x 2)     Status: None   Collection Time: 05/18/24  2:47 PM   Specimen: BLOOD  Result Value Ref Range Status   Specimen Description   Final    BLOOD LEFT ANTECUBITAL Performed at [facility], [address] [place], [place] [address]    Special Requests   Final    BOTTLES DRAWN  AEROBIC AND ANAEROBIC Blood Culture adequate volume Performed at [facility], [address] [place], [place] [address]    Culture   Final    NO GROWTH 5 DAYS Performed at [facility], [address], [place], [place] [address]    Report Status 05/23/2024 FINAL  Final  Urine Culture (for pregnant, neutropenic or urologic patients or patients with an indwelling urinary catheter)     Status: Abnormal   Collection Time: 05/18/24  3:18 PM   Specimen: In/Out Cath Urine  Result Value Ref Range Status   Specimen Description   Final    IN/OUT CATH URINE Performed at [facility], [address] [place], [place] [address]    Special Requests   Final    NONE Performed at [facility], [address] [place], [place] [address]    Culture (A)  Final    <10,000 COLONIES/mL INSIGNIFICANT GROWTH Performed at [facility], [address], [place], [place] [address]    Report Status 05/20/2024 FINAL  Final  Culture, blood (Routine X 2) w Reflex to ID Panel     Status: None   Collection Time: 05/19/24 10:25 AM   Specimen: BLOOD RIGHT HAND  Result Value Ref Range Status   Specimen Description   Final    BLOOD RIGHT HAND Performed at [facility], [address], [place], [place] [address]    Special Requests   Final    BOTTLES DRAWN AEROBIC ONLY Blood Culture results may not be optimal due to an inadequate volume of blood received in culture bottles Performed at [facility], [address], [place], [place] [address]    Culture   Final    NO GROWTH 5 DAYS Performed at [facility], [address] [place], [place] [address]    Report Status 05/24/2024 FINAL  Final         Radiology Studies: No results found.  Scheduled Meds:  amLODipine   5 mg Oral Daily   chlorhexidine   10 mL Mouth/Throat QID   Chlorhexidine  Gluconate Cloth  6 each Topical Daily   feeding supplement  1 Container Oral TID BM    filgrastim  (NIVESTYM ) SQ  300 mcg Subcutaneous Daily   insulin  aspart  0-5 Units Subcutaneous QHS   insulin  aspart  0-6 Units Subcutaneous TID WC   magic mouthwash w/lidocaine   5 mL Oral QID   mouth rinse  15 mL Mouth Rinse 4 times per day   sodium chloride  flush  10-40 mL Intracatheter Q12H   Continuous Infusions:          Sophie Mao, MD [facility] 05/27/2024, 8:05 AM   "

## 2024-05-28 ENCOUNTER — Ambulatory Visit (HOSPITAL_COMMUNITY)

## 2024-05-28 ENCOUNTER — Other Ambulatory Visit: Payer: Self-pay | Admitting: Hematology

## 2024-05-28 ENCOUNTER — Other Ambulatory Visit (HOSPITAL_BASED_OUTPATIENT_CLINIC_OR_DEPARTMENT_OTHER): Payer: Self-pay

## 2024-05-28 DIAGNOSIS — R5081 Fever presenting with conditions classified elsewhere: Secondary | ICD-10-CM | POA: Diagnosis not present

## 2024-05-28 DIAGNOSIS — D709 Neutropenia, unspecified: Secondary | ICD-10-CM | POA: Diagnosis not present

## 2024-05-28 DIAGNOSIS — E43 Unspecified severe protein-calorie malnutrition: Secondary | ICD-10-CM | POA: Insufficient documentation

## 2024-05-28 LAB — GLUCOSE, CAPILLARY
Glucose-Capillary: 94 mg/dL (ref 70–99)
Glucose-Capillary: 127 mg/dL — ABNORMAL HIGH (ref 70–99)

## 2024-05-28 LAB — CBC WITH DIFFERENTIAL/PLATELET
Basophils Absolute: 0 K/uL (ref 0.0–0.1)
Basophils Relative: 0 %
Eosinophils Absolute: 0 K/uL (ref 0.0–0.5)
Eosinophils Relative: 1 %
HCT: 25.8 % — ABNORMAL LOW (ref 36.0–46.0)
Hemoglobin: 9.1 g/dL — ABNORMAL LOW (ref 12.0–15.0)
Lymphocytes Relative: 92 %
Lymphs Abs: 4 K/uL (ref 0.7–4.0)
MCH: 30.3 pg (ref 26.0–34.0)
MCHC: 35.3 g/dL (ref 30.0–36.0)
MCV: 86 fL (ref 80.0–100.0)
Monocytes Absolute: 0.2 K/uL (ref 0.1–1.0)
Monocytes Relative: 4 %
Neutro Abs: 0.1 K/uL — CL (ref 1.7–7.7)
Neutrophils Relative %: 3 %
Platelets: 32 K/uL — ABNORMAL LOW (ref 150–400)
RBC: 3 MIL/uL — ABNORMAL LOW (ref 3.87–5.11)
RDW: 18 % — ABNORMAL HIGH (ref 11.5–15.5)
WBC: 4.4 K/uL (ref 4.0–10.5)
nRBC: 4.3 % — ABNORMAL HIGH (ref 0.0–0.2)

## 2024-05-28 LAB — TYPE AND SCREEN
ABO/RH(D): O POS
Antibody Screen: NEGATIVE
Unit division: 0

## 2024-05-28 LAB — BPAM RBC
Blood Product Expiration Date: 202602092359
ISSUE DATE / TIME: 202601091229
Unit Type and Rh: 5100

## 2024-05-28 LAB — BASIC METABOLIC PANEL WITH GFR
Anion gap: 6 (ref 5–15)
BUN: 6 mg/dL (ref 6–20)
CO2: 28 mmol/L (ref 22–32)
Calcium: 8.7 mg/dL — ABNORMAL LOW (ref 8.9–10.3)
Chloride: 103 mmol/L (ref 98–111)
Creatinine, Ser: 0.53 mg/dL (ref 0.44–1.00)
GFR, Estimated: >60 mL/min
Glucose, Bld: 92 mg/dL (ref 70–99)
Potassium: 4 mmol/L (ref 3.5–5.1)
Sodium: 137 mmol/L (ref 135–145)

## 2024-05-28 LAB — MAGNESIUM: Magnesium: 1.9 mg/dL (ref 1.7–2.4)

## 2024-05-28 MED ORDER — POTASSIUM CHLORIDE CRYS ER 20 MEQ PO TBCR
40.0000 meq | EXTENDED_RELEASE_TABLET | Freq: Two times a day (BID) | ORAL | 0 refills | Status: AC
  Filled 2024-05-28: qty 2, 1d supply, fill #0
  Filled 2024-05-28: qty 118, 29d supply, fill #0

## 2024-05-28 MED ORDER — HEPARIN SOD (PORK) LOCK FLUSH 100 UNIT/ML IV SOLN
500.0000 [IU] | INTRAVENOUS | Status: AC | PRN
  Administered 2024-05-28: 500 [IU]

## 2024-05-28 NOTE — Progress Notes (Signed)
 RAMON BRANT   DOB:1970/12/25 (age 54)   FM#:[id number]   RDW#:[id number]  Medical oncology follow-up  Subjective: Patient is well-known to me, was admitted on May 19, 2024 for neutropenic fever and severe mucositis.  She has slowly recovered well, but remained to be severely neutropenic with ANC 0.1 today.  She is eating much better, is eager to go home today.   Objective:  Vitals:   05/27/24 2106 05/28/24 0431  BP:  (!) 100/56  Pulse:  62  Resp:  16  Temp: 98.4 F (36.9 C) 98.1 F (36.7 C)  SpO2:  98%    Body mass index is 22.65 kg/m.  Intake/Output Summary (Last 24 hours) at 05/28/2024 1302 Last data filed at 05/27/2024 2023 Gross per 24 hour  Intake 716.19 ml  Output --  Net 716.19 ml     Sclerae unicteric  Oropharynx clear  No peripheral adenopathy  Lungs clear -- no rales or rhonchi  Heart regular rate and rhythm  Abdomen benign  MSK no focal spinal tenderness, no peripheral edema  Neuro nonfocal    CBG (last 3)  Recent Labs    05/27/24 2021 05/28/24 0734 05/28/24 1124  GLUCAP 142* 94 127*     Labs:  Lab Results  Component Value Date   WBC 4.4 05/28/2024   HGB 9.1 (L) 05/28/2024   HCT 25.8 (L) 05/28/2024   MCV 86.0 05/28/2024   PLT 32 (L) 05/28/2024   NEUTROABS 0.1 (LL) 05/28/2024    Urine Studies No results for input(s): UHGB, CRYS in the last 72 hours.  Invalid input(s): UACOL, UAPR, USPG, UPH, UTP, UGL, UKET, UBIL, UNIT, UROB, [place], UEPI, UWBC, CORINN NUMBERS [place], [place], [place]  Basic Metabolic Panel: Recent Labs  Lab 05/24/24 0258 05/25/24 0809 05/26/24 0330 05/27/24 0303 05/28/24 0254  NA 140 143 137 137 137  K 2.7* 3.6 2.4* 2.9* 4.0  CL 107 111 101 101 103  CO2 24 24 27 28 28   GLUCOSE 128* 198* 110* 108* 92  BUN 10 11 8 9 6   CREATININE 0.44 0.42* 0.39* 0.43* 0.53  CALCIUM  8.0* 7.8* 8.1* 8.3* 8.7*  MG 1.9 2.0 2.0 2.0 1.9   GFR Estimated Creatinine Clearance: 65.7 mL/min (by C-G formula based on SCr  of 0.53 mg/dL). Liver Function Tests: Recent Labs  Lab 05/22/24 0512 05/23/24 0344 05/25/24 0809 05/27/24 0303  AST 78* 70* 46* 25  ALT 59* 48* 32 21  ALKPHOS 52 57 49 50  BILITOT 0.9 1.0 0.8 0.9  PROT 6.4* 6.7 6.2* 6.1*  ALBUMIN  2.9* 3.1* 2.9* 3.1*   No results for input(s): LIPASE, AMYLASE in the last 168 hours. No results for input(s): AMMONIA in the last 168 hours. Coagulation profile No results for input(s): INR, PROTIME in the last 168 hours.  CBC: Recent Labs  Lab 05/24/24 0258 05/25/24 0809 05/25/24 1800 05/26/24 0330 05/27/24 0303 05/28/24 0254  WBC 3.2* 3.0*  --  2.7* 3.5* 4.4  NEUTROABS 1.6* 0.4*  --  0.3* 0.2* 0.1*  HGB 7.0* 7.1* 9.1* 8.8* 9.0* 9.1*  HCT 20.6* 20.4* 25.5* 24.7* 24.9* 25.8*  MCV 89.6 87.9  --  85.5 85.6 86.0  PLT 40* 29*  --  19* 23* 32*   Cardiac Enzymes: No results for input(s): CKTOTAL, CKMB, CKMBINDEX, TROPONINI in the last 168 hours. BNP: Invalid input(s): POCBNP CBG: Recent Labs  Lab 05/27/24 1214 05/27/24 1644 05/27/24 2021 05/28/24 0734 05/28/24 1124  GLUCAP 109* 128* 142* 94 127*   D-Dimer No results for input(s): DDIMER in  the last 72 hours. Hgb A1c No results for input(s): HGBA1C in the last 72 hours. Lipid Profile No results for input(s): CHOL, HDL, LDLCALC, TRIG, CHOLHDL, LDLDIRECT in the last 72 hours. Thyroid  function studies No results for input(s): TSH, T4TOTAL, T3FREE, THYROIDAB in the last 72 hours.  Invalid input(s): FREET3 Anemia work up No results for input(s): VITAMINB12, FOLATE, FERRITIN, TIBC, IRON, RETICCTPCT in the last 72 hours. Microbiology Recent Results (from the past 240 hours)  Resp panel by RT-PCR (RSV, Flu A&B, Covid) Anterior Nasal Swab     Status: None   Collection Time: 05/18/24  2:26 PM   Specimen: Anterior Nasal Swab  Result Value Ref Range Status   SARS Coronavirus 2 by RT PCR NEGATIVE NEGATIVE Final    Comment:  (NOTE) SARS-CoV-2 target nucleic acids are NOT DETECTED.  The SARS-CoV-2 RNA is generally detectable in upper respiratory specimens during the acute phase of infection. The lowest concentration of SARS-CoV-2 viral copies this assay can detect is 138 copies/mL. A negative result does not preclude SARS-Cov-2 infection and should not be used as the sole basis for treatment or other patient management decisions. A negative result may occur with  improper specimen collection/handling, submission of specimen other than nasopharyngeal swab, presence of viral mutation(s) within the areas targeted by this assay, and inadequate number of viral copies(<138 copies/mL). A negative result must be combined with clinical observations, patient history, and epidemiological information. The expected result is Negative.  Fact Sheet for Patients:  bloggercourse.com  Fact Sheet for Healthcare Providers:  seriousbroker.it  This test is no t yet approved or cleared by the [place]  FDA and  has been authorized for detection and/or diagnosis of SARS-CoV-2 by FDA under an Emergency Use Authorization (EUA). This EUA will remain  in effect (meaning this test can be used) for the duration of the COVID-19 declaration under Section 564(b)(1) of the Act, 21 U.S.C.section 360bbb-3(b)(1), unless the authorization is terminated  or revoked sooner.       Influenza A by PCR NEGATIVE NEGATIVE Final   Influenza B by PCR NEGATIVE NEGATIVE Final    Comment: (NOTE) The Xpert Xpress SARS-CoV-2/FLU/RSV plus assay is intended as an aid in the diagnosis of influenza from Nasopharyngeal swab specimens and should not be used as a sole basis for treatment. Nasal washings and aspirates are unacceptable for Xpert Xpress SARS-CoV-2/FLU/RSV testing.  Fact Sheet for Patients: bloggercourse.com  Fact Sheet for Healthcare  Providers: seriousbroker.it  This test is not yet approved or cleared by the [place]  FDA and has been authorized for detection and/or diagnosis of SARS-CoV-2 by FDA under an Emergency Use Authorization (EUA). This EUA will remain in effect (meaning this test can be used) for the duration of the COVID-19 declaration under Section 564(b)(1) of the Act, 21 U.S.C. section 360bbb-3(b)(1), unless the authorization is terminated or revoked.     Resp Syncytial Virus by PCR NEGATIVE NEGATIVE Final    Comment: (NOTE) Fact Sheet for Patients: bloggercourse.com  Fact Sheet for Healthcare Providers: seriousbroker.it  This test is not yet approved or cleared by the [place]  FDA and has been authorized for detection and/or diagnosis of SARS-CoV-2 by FDA under an Emergency Use Authorization (EUA). This EUA will remain in effect (meaning this test can be used) for the duration of the COVID-19 declaration under Section 564(b)(1) of the Act, 21 U.S.C. section 360bbb-3(b)(1), unless the authorization is terminated or revoked.  Performed at [facility], [address], [place], [place] [address]  Blood culture (routine x 2)     Status: None   Collection Time: 05/18/24  2:47 PM   Specimen: BLOOD  Result Value Ref Range Status   Specimen Description   Final    BLOOD LEFT ANTECUBITAL Performed at [facility], [address] [place], [place] [address]    Special Requests   Final    BOTTLES DRAWN AEROBIC AND ANAEROBIC Blood Culture adequate volume Performed at [facility], [address] [place], [place] [address]    Culture   Final    NO GROWTH 5 DAYS Performed at [facility], [address] [place], [place] [address]    Report Status 05/23/2024 FINAL  Final  Urine Culture (for pregnant, neutropenic or urologic patients or patients with an indwelling urinary  catheter)     Status: Abnormal   Collection Time: 05/18/24  3:18 PM   Specimen: In/Out Cath Urine  Result Value Ref Range Status   Specimen Description   Final    IN/OUT CATH URINE Performed at [facility], [address] [place], [place] [address]    Special Requests   Final    NONE Performed at [facility], [address] [place], [place] [address]    Culture (A)  Final    <10,000 COLONIES/mL INSIGNIFICANT GROWTH Performed at [facility], [address], [place], [place] [address]    Report Status 05/20/2024 FINAL  Final  Culture, blood (Routine X 2) w Reflex to ID Panel     Status: None   Collection Time: 05/19/24 10:25 AM   Specimen: BLOOD RIGHT HAND  Result Value Ref Range Status   Specimen Description   Final    BLOOD RIGHT HAND Performed at [facility], [address], [place], [place] [address]    Special Requests   Final    BOTTLES DRAWN AEROBIC ONLY Blood Culture results may not be optimal due to an inadequate volume of blood received in culture bottles Performed at [facility], [address], [place], [place] [address]    Culture   Final    NO GROWTH 5 DAYS Performed at [facility], [address], [place], [place] [address]    Report Status 05/24/2024 FINAL  Final      Studies:  No results found.  Assessment: 54 y.o. female   Neutropenic fever Severe mucositis secondary to chemotherapy Stage I gastric cancer, status post surgery, on adjuvant chemotherapy FOLFOX Type 2 diabetes Deconditioning Severe malnutrition    Plan:  - Lab reviewed, ANC 0.1, WBC has returned to normal.  She has not had a fever for several days, or blood culture and urine culture were negative. - Her mucositis has resolved, she is eating well. - Clear to discharge from my standpoint, I will get the G-CSF approved after discharge.  Plan to repeat lab in 2 to 3 days and give more G-CSF if needed. - Follow-up in 1-2  week, will likely postpone her next cycle chemo for 2-4 more weeks to allow adequate recovery.  Will likely reduce her chemo dose for next cycle, or change to 5-fu alone.   Onita Mattock, MD 05/28/2024  1:02 PM

## 2024-05-28 NOTE — Discharge Summary (Addendum)
 Physician Discharge Summary  Susan Whitaker FMW:[id number] DOB: January 03, 1971 DOA: 05/18/2024  PCP: Katheen Roselie Rockford, NP  Admit date: 05/18/2024 Discharge date: 05/28/2024  Admitted From: Home Disposition: Home  Recommendations for Outpatient Follow-up:  Follow up with PCP in 1 week with repeat CBC/BMP Outpatient follow-up with oncology at earliest convenience patient Follow up in ED if symptoms worsen or new appear   Home Health: PT/OT Equipment/Devices: None  Discharge Condition: Stable CODE STATUS: Full Diet recommendation: Regular  Brief/Interim Summary: 54 y.o. female with medical history significant for well-controlled type 2 diabetes, GERD, hypertension, hyperlipidemia, gastric adenocarcinoma on neoadjuvant chemotherapy presented with weakness, lethargy, poor oral intake due to mucositis with nausea and is currently admitted for neutropenic fever and mucositis.  She was treated with broad-spectrum antibiotics.  Oncology following.  PT recommending SNF placement.  Patient/family not interested in SNF placement.  Subsequently, she has completed antibiotic treatment.  She is current hemodynamically stable, afebrile and tolerating diet.  She is still neutropenic but oncology has cleared the patient for discharge.  She will be discharged home today with close outpatient follow-up with PCP and oncology.  Discharge Diagnoses:   Neutropenic fever Pancytopenia Stage Ib gastric adenocarcinoma status post recent chemotherapy Mucositis Severe sepsis: Present on admission -Oncology signed off on 05/24/2024 and recommended outpatient oncology follow-up: filgrastim  discontinued on 05/24/2024 as neutropenia has resolved but was restarted on 05/25/2024 by oncology due to neutropenia again.  Neutropenic today as well.  Hemoglobin 9.1 today: Received 1 unit packed red cell transfusion on 05/25/2024.   -Platelets 32 today.  No signs of bleeding.   - Sepsis has resolved.  No temperature spikes over the last  several days.  Blood cultures have remained negative so far.  Urine cultures showed insignificant growth.  Completed 7 days course of cefepime . -Oral intake improving.  Diet as tolerated -She is still neutropenic but oncology has cleared the patient for discharge.  She will be discharged home today with close outpatient follow-up with PCP and oncology.   Hypernatremia Dehydration - Hypernatremia is resolved. -IV fluids discontinued on 05/24/2024.  Encourage oral intake   Acute metabolic acidosis - Resolved   Elevated LFTs - Questionable cause.  Improving.  Monitor intermittently   Hypokalemia - Improved.  Continue supplementation on discharge   History of diet-controlled diabetes mellitus type 2 with hypoglycemia - Blood sugars improving.  Off of IV fluids   Physical deconditioning -PT recommending SNF placement.  TOC following.  Patient declining SNF.  Will need home health PT/OT   Severe malnutrition - Follow nutrition recommendations   Discharge Instructions  Discharge Instructions     Diet general   Complete by: As directed    Increase activity slowly   Complete by: As directed       Allergies as of 05/28/2024       Reactions   Lisinopril Cough        Medication List     STOP taking these medications    methocarbamol  500 MG tablet Commonly known as: ROBAXIN    ondansetron  4 MG disintegrating tablet Commonly known as: ZOFRAN -ODT   pantoprazole  40 MG tablet Commonly known as: PROTONIX        TAKE these medications    Accu-Chek Guide Test test strip Generic drug: glucose blood Use 1 each at morning, noon, and bedtime to check blood glucose. What changed: Another medication with the same name was removed. Continue taking this medication, and follow the directions you see here.   acetaminophen  500 MG tablet Commonly  known as: TYLENOL  Take 500 mg by mouth every 6 (six) hours as needed for moderate pain (pain score 4-6).   amLODipine  5 MG  tablet Commonly known as: NORVASC  Take 1 tablet (5 mg total) by mouth daily.   atorvastatin  80 MG tablet Commonly known as: LIPITOR Take 1 tablet (80 mg total) by mouth daily.   dexamethasone  4 MG tablet Commonly known as: DECADRON  Take 2 tablets (8 mg total) by mouth daily. Start the day after chemotherapy for 2 days. Take with food.   diphenhydrAMINE -nystatin -alum & mag hydroxide-simeth-lidocaine  Take 5 mLs by mouth 4 (four) times daily as needed.   feeding supplement Liqd Take 237 mLs by mouth 2 (two) times daily between meals.   lidocaine -prilocaine  cream Commonly known as: EMLA  Apply to affected area once.   Mirena  (52 MG) 20 MCG/DAY Iud Generic drug: levonorgestrel  1 each by Intrauterine route once.   ondansetron  8 MG tablet Commonly known as: ZOFRAN  Take 1 tablet (8 mg total) by mouth every 8 (eight) hours as needed for nausea or vomiting. Start on the third day after chemotherapy.   oxyCODONE  5 MG immediate release tablet Commonly known as: Oxy IR/ROXICODONE  Take 1-2 tablets (5-10 mg total) by mouth every 4 (four) hours as needed for moderate pain (pain score 4-6).   polyethylene glycol 17 g packet Commonly known as: MIRALAX  / GLYCOLAX  Take 17 g by mouth daily as needed for moderate constipation.   potassium chloride  SA 20 MEQ tablet Commonly known as: KLOR-CON  M Take 2 tablets (40 mEq total) by mouth 2 (two) times daily. Correction to previous prescription   prochlorperazine  10 MG tablet Commonly known as: COMPAZINE  Take 1 tablet (10 mg total) by mouth every 6 (six) hours as needed for nausea or vomiting.               Durable Medical Equipment  (From admission, onward)           Start     Ordered   05/24/24 1411  For home use only DME Walker  Once       Question:  Patient needs a walker to treat with the following condition  Answer:  Weakness   05/24/24 1411   05/23/24 1347  For home use only DME 3 n 1  Once        05/23/24 1346             Contact information for follow-up providers     [employer] (DME) Follow up.   Specialty: DME Services Why: 3n1,w/c Contact information: [address] [place]  [address] [phone]        Nche, Roselie Rockford, NP. Schedule an appointment as soon as possible for a visit in 1 week(s).   Specialty: Internal Medicine Contact information: [address] [place] [address] [phone]              Contact information for after-discharge care     Home Medical Care     [facility]) .   Service: Home Health Services Why: HHPT/OT Contact information: [address] [place]  [phone]                    Allergies[1]  Consultations: Oncology   Procedures/Studies: US  Venous Img Lower Unilateral Right Result Date: 05/18/2024 EXAM: ULTRASOUND DUPLEX OF THE RIGHT LOWER EXTREMITY VEINS TECHNIQUE: Duplex ultrasound using B-mode/gray scaled imaging and Doppler spectral analysis and color flow was  obtained of the deep venous structures of the right lower extremity. COMPARISON: None available. CLINICAL HISTORY: Pain, hx of CA FINDINGS: The common femoral vein, femoral vein, popliteal vein, and posterior tibial vein demonstrate normal compressibility with normal color flow and spectral analysis. IMPRESSION: 1. No evidence of DVT. Electronically signed by: Elsie Gravely MD 05/18/2024 06:15 PM EST RP Workstation: [id number]   CT ABDOMEN PELVIS W CONTRAST Result Date: 05/18/2024 EXAM: CT ABDOMEN AND PELVIS WITH CONTRAST 05/18/2024 04:47:23 PM TECHNIQUE: CT of the abdomen and pelvis was performed with the administration of 80 mL of iohexol  (OMNIPAQUE ) 350 MG/ML injection. Multiplanar reformatted images are provided for review. Automated exposure control, iterative reconstruction, and/or weight-based adjustment of the mA/kV was utilized to reduce the radiation dose to as low as  reasonably achievable. COMPARISON: CT enterography 11/11/2023, CT chest 11/29/2023. CLINICAL HISTORY: gastric ca, ams, emesis. FINDINGS: LOWER CHEST: Suggestion of distal esophageal wall thickening versus hiatal hernia. LIVER: Diffusely hypodense hepatic parenchyma compared to the spleen. GALLBLADDER AND BILE DUCTS: Gallbladder is unremarkable. No biliary ductal dilatation. SPLEEN: No acute abnormality. PANCREAS: No acute abnormality. ADRENAL GLANDS: No acute abnormality. KIDNEYS, URETERS AND BLADDER: No stones in the kidneys or ureters. No hydronephrosis. No perinephric or periureteral stranding. Urinary bladder is unremarkable. GI AND BOWEL: Gastric surgical changes noted. Limited evaluation of the gastric lumen due to underdistension. Possible underlying gastric wall hypodense thickening. No small or large bowel thickening or dilatation. The appendix is not definitely identified with no inflammatory changes in the right lower quadrant to suggest acute appendicitis. PERITONEUM AND RETROPERITONEUM: No ascites. No free air. VASCULATURE: At least moderate atherosclerotic plaque. Aorta is normal in caliber. LYMPH NODES: No lymphadenopathy. REPRODUCTIVE ORGANS: Intramural calcified uterine fibroids. T-shaped intrauterine device in appropriate position. BONES AND SOFT TISSUES: No acute osseous abnormality. No focal soft tissue abnormality. IMPRESSION: 1. Limited evaluation of the gastric lumen due to underdistension, with postoperative gastric changes. Possible underlying gastric wall hypodense thickening. 2. Distal esophageal wall thickening versus a hiatal hernia. 3. No evidence of metastatic disease. Electronically signed by: Morgane Naveau MD 05/18/2024 05:05 PM EST RP Workstation: [id number]   CT Head Wo Contrast Result Date: 05/18/2024 EXAM: CT HEAD WITHOUT CONTRAST 05/18/2024 04:47:23 PM TECHNIQUE: CT of the head was performed without the administration of intravenous contrast. Automated exposure control,  iterative reconstruction, and/or weight based adjustment of the mA/kV was utilized to reduce the radiation dose to as low as reasonably achievable. COMPARISON: None available. CLINICAL HISTORY: Delirium FINDINGS: BRAIN AND VENTRICLES: No acute hemorrhage. No evidence of acute infarct. No hydrocephalus. No extra-axial collection. No mass effect or midline shift. ORBITS: No acute abnormality. SINUSES: No acute abnormality. SOFT TISSUES AND SKULL: No acute soft tissue abnormality. No skull fracture. IMPRESSION: 1. No acute intracranial abnormality. Electronically signed by: Morgane Naveau MD 05/18/2024 04:57 PM EST RP Workstation: [id number]   CT Angio Chest PE W and/or Wo Contrast Result Date: 05/18/2024 EXAM: CTA CHEST 05/18/2024 04:47:23 PM TECHNIQUE: CTA of the chest was performed after the administration of 80 mL of iohexol  (OMNIPAQUE ) 350 MG/ML injection. Multiplanar reformatted images are provided for review. MIP images are provided for review. Automated exposure control, iterative reconstruction, and/or weight based adjustment of the mA/kV was utilized to reduce the radiation dose to as low as reasonably achievable. COMPARISON: CT enterography 11/11/2023, CT chest 11/29/2023. CLINICAL HISTORY: Pulmonary embolism (PE) suspected, high prob; SOB, CA, AMS. FINDINGS: PULMONARY ARTERIES: Pulmonary arteries are adequately opacified for evaluation. No acute pulmonary embolus. Main pulmonary artery is normal in caliber.  MEDIASTINUM: The heart and pericardium demonstrate no acute abnormality. There is no acute abnormality of the thoracic aorta. Left chest wall port-a-cath with tip at the superior vena cava-right atrial junction. LYMPH NODES: Right hilar lymphadenopathy measuring up to 1.4 cm. No mediastinal or axillary lymphadenopathy. LUNGS AND PLEURA: The lungs are without acute process. No focal consolidation or pulmonary edema. No evidence of pleural effusion or pneumothorax. UPPER ABDOMEN: Please see separately  dictated CT abdomen and pelvis 05/18/2024 SOFT TISSUES AND BONES: No acute bone or soft tissue abnormality. IMPRESSION: 1. No pulmonary embolism. 2. Right hilar lymphadenopathy measuring up to 1.4 cm. Indeterminate etiology. Recommended attention on follow-up. Electronically signed by: Morgane Naveau MD 05/18/2024 04:55 PM EST RP Workstation: [id number]   DG Chest Portable 1 View Result Date: 05/18/2024 EXAM: 1 VIEW(S) XRAY OF THE CHEST 05/18/2024 03:46:00 PM COMPARISON: CXR 02/15/2024, CT Chest 11/29/2023. CLINICAL HISTORY: ams Patient is receiving chemo for abd cancer per daughters. Hx abd surg and colon resection. Here today w/ chemo mouth- oral sores and blisters, decreased appetite, aloc. Non smoker. FINDINGS: LINES, TUBES AND DEVICES: Left chest Port-A-Cath in place with tip in distal SVC. LUNGS AND PLEURA: No focal pulmonary opacity. No pleural effusion. No pneumothorax. HEART AND MEDIASTINUM: No acute abnormality of the cardiac and mediastinal silhouettes. BONES AND SOFT TISSUES: No acute osseous abnormality. IMPRESSION: 1. No acute cardiopulmonary abnormality. 2. Left chest Port-A-Cath tip projects over the distal SVC. Electronically signed by: Morgane Naveau MD 05/18/2024 04:50 PM EST RP Workstation: [id number]      Subjective: Patient seen and examined at bedside.  Denies worsening shortness of breath, abdominal pain, vomiting.  Wants to go home today.   Discharge Exam: Vitals:   05/27/24 2106 05/28/24 0431  BP:  (!) 100/56  Pulse:  62  Resp:  16  Temp: 98.4 F (36.9 C) 98.1 F (36.7 C)  SpO2:  98%    General: On room air currently.  No distress.  Chronically ill and deconditioned appearing. ENT/neck: No obvious thyromegaly or elevated JVD noted respiratory: Bilateral decreased breath sounds at bases with some crackles  CVS: S1-S2 heard.  Rate mostly controlled  abdominal: Soft, nontender, remains slightly distended; no organomegaly, normal bowel sounds heard  extremities:  No clubbing; mild lower extremity edema present CNS: No obvious focal deficits noted.  Alert, awake.   Lymph: No palpable lymphadenopathy noted skin: No obvious ecchymosis/lesions psych: Affect is mostly flat  musculoskeletal: No obvious joint swelling/deformity      The results of significant diagnostics from this hospitalization (including imaging, microbiology, ancillary and laboratory) are listed below for reference.     Microbiology: Recent Results (from the past 240 hours)  Resp panel by RT-PCR (RSV, Flu A&B, Covid) Anterior Nasal Swab     Status: None   Collection Time: 05/18/24  2:26 PM   Specimen: Anterior Nasal Swab  Result Value Ref Range Status   SARS Coronavirus 2 by RT PCR NEGATIVE NEGATIVE Final    Comment: (NOTE) SARS-CoV-2 target nucleic acids are NOT DETECTED.  The SARS-CoV-2 RNA is generally detectable in upper respiratory specimens during the acute phase of infection. The lowest concentration of SARS-CoV-2 viral copies this assay can detect is 138 copies/mL. A negative result does not preclude SARS-Cov-2 infection and should not be used as the sole basis for treatment or other patient management decisions. A negative result may occur with  improper specimen collection/handling, submission of specimen other than nasopharyngeal swab, presence of viral mutation(s) within the areas targeted by this assay,  and inadequate number of viral copies(<138 copies/mL). A negative result must be combined with clinical observations, patient history, and epidemiological information. The expected result is Negative.  Fact Sheet for Patients:  bloggercourse.com  Fact Sheet for Healthcare Providers:  seriousbroker.it  This test is no t yet approved or cleared by the [place]  FDA and  has been authorized for detection and/or diagnosis of SARS-CoV-2 by FDA under an Emergency Use Authorization (EUA). This EUA will remain   in effect (meaning this test can be used) for the duration of the COVID-19 declaration under Section 564(b)(1) of the Act, 21 U.S.C.section 360bbb-3(b)(1), unless the authorization is terminated  or revoked sooner.       Influenza A by PCR NEGATIVE NEGATIVE Final   Influenza B by PCR NEGATIVE NEGATIVE Final    Comment: (NOTE) The Xpert Xpress SARS-CoV-2/FLU/RSV plus assay is intended as an aid in the diagnosis of influenza from Nasopharyngeal swab specimens and should not be used as a sole basis for treatment. Nasal washings and aspirates are unacceptable for Xpert Xpress SARS-CoV-2/FLU/RSV testing.  Fact Sheet for Patients: bloggercourse.com  Fact Sheet for Healthcare Providers: seriousbroker.it  This test is not yet approved or cleared by the [place]  FDA and has been authorized for detection and/or diagnosis of SARS-CoV-2 by FDA under an Emergency Use Authorization (EUA). This EUA will remain in effect (meaning this test can be used) for the duration of the COVID-19 declaration under Section 564(b)(1) of the Act, 21 U.S.C. section 360bbb-3(b)(1), unless the authorization is terminated or revoked.     Resp Syncytial Virus by PCR NEGATIVE NEGATIVE Final    Comment: (NOTE) Fact Sheet for Patients: bloggercourse.com  Fact Sheet for Healthcare Providers: seriousbroker.it  This test is not yet approved or cleared by the [place]  FDA and has been authorized for detection and/or diagnosis of SARS-CoV-2 by FDA under an Emergency Use Authorization (EUA). This EUA will remain in effect (meaning this test can be used) for the duration of the COVID-19 declaration under Section 564(b)(1) of the Act, 21 U.S.C. section 360bbb-3(b)(1), unless the authorization is terminated or revoked.  Performed at [facility], [address] [place], [place] [address]    Blood culture (routine x 2)     Status: None   Collection Time: 05/18/24  2:47 PM   Specimen: BLOOD  Result Value Ref Range Status   Specimen Description   Final    BLOOD LEFT ANTECUBITAL Performed at [facility], [address] [place], [place] [address]    Special Requests   Final    BOTTLES DRAWN AEROBIC AND ANAEROBIC Blood Culture adequate volume Performed at [facility], [address] [place], [place] [address]    Culture   Final    NO GROWTH 5 DAYS Performed at [facility], [address] [place], [place] [address]    Report Status 05/23/2024 FINAL  Final  Urine Culture (for pregnant, neutropenic or urologic patients or patients with an indwelling urinary catheter)     Status: Abnormal   Collection Time: 05/18/24  3:18 PM   Specimen: In/Out Cath Urine  Result Value Ref Range Status   Specimen Description   Final    IN/OUT CATH URINE Performed at [facility], [address] [place], [place] [address]    Special Requests   Final    NONE Performed at [facility], [address], [place], [place] [address]  Culture (A)  Final    <10,000 COLONIES/mL INSIGNIFICANT GROWTH Performed at [facility], [address], [place], [place] [address]    Report Status 05/20/2024 FINAL  Final  Culture, blood (Routine X 2) w Reflex to ID Panel     Status: None   Collection Time: 05/19/24 10:25 AM   Specimen: BLOOD RIGHT HAND  Result Value Ref Range Status   Specimen Description   Final    BLOOD RIGHT HAND Performed at [facility], [address], [place], [place] [address]    Special Requests   Final    BOTTLES DRAWN AEROBIC ONLY Blood Culture results may not be optimal due to an inadequate volume of blood received in culture bottles Performed at [facility], [address], [place], [place] [address]    Culture   Final    NO GROWTH 5 DAYS Performed at [facility], [address], [place], [place] [address]    Report Status 05/24/2024 FINAL  Final     Labs: BNP (last 3 results) No results for input(s): BNP in the last 8760 hours. Basic Metabolic Panel: Recent Labs  Lab 05/24/24 0258 05/25/24 0809 05/26/24 0330 05/27/24 0303 05/28/24 0254  NA 140 143 137 137 137  K 2.7* 3.6 2.4* 2.9* 4.0  CL 107 111 101 101 103  CO2 24 24 27 28 28   GLUCOSE 128* 198* 110* 108* 92  BUN 10 11 8 9 6   CREATININE 0.44 0.42* 0.39* 0.43* 0.53  CALCIUM  8.0* 7.8* 8.1* 8.3* 8.7*  MG 1.9 2.0 2.0 2.0 1.9   Liver Function Tests: Recent Labs  Lab 05/22/24 0512 05/23/24 0344 05/25/24 0809 05/27/24 0303  AST 78* 70* 46* 25  ALT 59* 48* 32 21  ALKPHOS 52 57 49 50  BILITOT 0.9 1.0 0.8 0.9  PROT 6.4* 6.7 6.2* 6.1*  ALBUMIN  2.9* 3.1* 2.9* 3.1*   No results for input(s): LIPASE, AMYLASE in the last 168 hours. No results for input(s): AMMONIA in the last 168 hours. CBC: Recent Labs  Lab 05/24/24 0258 05/25/24 0809 05/25/24 1800 05/26/24 0330 05/27/24 0303 05/28/24 0254  WBC 3.2* 3.0*  --  2.7* 3.5* 4.4  NEUTROABS 1.6* 0.4*  --  0.3* 0.2* 0.1*  HGB 7.0* 7.1* 9.1* 8.8* 9.0* 9.1*  HCT 20.6* 20.4* 25.5* 24.7* 24.9* 25.8*  MCV 89.6 87.9  --  85.5 85.6 86.0  PLT 40* 29*  --  19* 23* 32*   Cardiac Enzymes: No results for input(s): CKTOTAL, CKMB, CKMBINDEX, TROPONINI in the last 168 hours. BNP: Invalid input(s): POCBNP CBG: Recent Labs  Lab 05/27/24 1214 05/27/24 1644 05/27/24 2021 05/28/24 0734 05/28/24 1124  GLUCAP 109* 128* 142* 94 127*   D-Dimer No results for input(s): DDIMER in the last 72 hours. Hgb A1c No results for input(s): HGBA1C in the last 72 hours. Lipid Profile No results for input(s): CHOL, HDL, LDLCALC, TRIG, CHOLHDL, LDLDIRECT in the last 72 hours. Thyroid  function studies No results for input(s): TSH, T4TOTAL, T3FREE, THYROIDAB in the last 72 hours.  Invalid input(s): FREET3 Anemia work up No  results for input(s): VITAMINB12, FOLATE, FERRITIN, TIBC, IRON, RETICCTPCT in the last 72 hours. Urinalysis    Component Value Date/Time   COLORURINE YELLOW 05/18/2024 1518   APPEARANCEUR HAZY (A) 05/18/2024 1518   LABSPEC 1.020 05/18/2024 1518   PHURINE 5.5 05/18/2024 1518   GLUCOSEU 100 (A) 05/18/2024 1518   GLUCOSEU >=1000 (A) 04/05/2019 1654   HGBUR LARGE (  A) 05/18/2024 1518   HGBUR moderate 04/28/2010 1113   BILIRUBINUR SMALL (A) 05/18/2024 1518   BILIRUBINUR neg 08/30/2023 0859   KETONESUR 40 (A) 05/18/2024 1518   PROTEINUR >=300 (A) 05/18/2024 1518   UROBILINOGEN 0.2 08/30/2023 0859   UROBILINOGEN 0.2 04/05/2019 1654   NITRITE NEGATIVE 05/18/2024 1518   LEUKOCYTESUR NEGATIVE 05/18/2024 1518   Sepsis Labs Recent Labs  Lab 05/25/24 0809 05/26/24 0330 05/27/24 0303 05/28/24 0254  WBC 3.0* 2.7* 3.5* 4.4   Microbiology Recent Results (from the past 240 hours)  Resp panel by RT-PCR (RSV, Flu A&B, Covid) Anterior Nasal Swab     Status: None   Collection Time: 05/18/24  2:26 PM   Specimen: Anterior Nasal Swab  Result Value Ref Range Status   SARS Coronavirus 2 by RT PCR NEGATIVE NEGATIVE Final    Comment: (NOTE) SARS-CoV-2 target nucleic acids are NOT DETECTED.  The SARS-CoV-2 RNA is generally detectable in upper respiratory specimens during the acute phase of infection. The lowest concentration of SARS-CoV-2 viral copies this assay can detect is 138 copies/mL. A negative result does not preclude SARS-Cov-2 infection and should not be used as the sole basis for treatment or other patient management decisions. A negative result may occur with  improper specimen collection/handling, submission of specimen other than nasopharyngeal swab, presence of viral mutation(s) within the areas targeted by this assay, and inadequate number of viral copies(<138 copies/mL). A negative result must be combined with clinical observations, patient history, and  epidemiological information. The expected result is Negative.  Fact Sheet for Patients:  bloggercourse.com  Fact Sheet for Healthcare Providers:  seriousbroker.it  This test is no t yet approved or cleared by the [place]  FDA and  has been authorized for detection and/or diagnosis of SARS-CoV-2 by FDA under an Emergency Use Authorization (EUA). This EUA will remain  in effect (meaning this test can be used) for the duration of the COVID-19 declaration under Section 564(b)(1) of the Act, 21 U.S.C.section 360bbb-3(b)(1), unless the authorization is terminated  or revoked sooner.       Influenza A by PCR NEGATIVE NEGATIVE Final   Influenza B by PCR NEGATIVE NEGATIVE Final    Comment: (NOTE) The Xpert Xpress SARS-CoV-2/FLU/RSV plus assay is intended as an aid in the diagnosis of influenza from Nasopharyngeal swab specimens and should not be used as a sole basis for treatment. Nasal washings and aspirates are unacceptable for Xpert Xpress SARS-CoV-2/FLU/RSV testing.  Fact Sheet for Patients: bloggercourse.com  Fact Sheet for Healthcare Providers: seriousbroker.it  This test is not yet approved or cleared by the [place]  FDA and has been authorized for detection and/or diagnosis of SARS-CoV-2 by FDA under an Emergency Use Authorization (EUA). This EUA will remain in effect (meaning this test can be used) for the duration of the COVID-19 declaration under Section 564(b)(1) of the Act, 21 U.S.C. section 360bbb-3(b)(1), unless the authorization is terminated or revoked.     Resp Syncytial Virus by PCR NEGATIVE NEGATIVE Final    Comment: (NOTE) Fact Sheet for Patients: bloggercourse.com  Fact Sheet for Healthcare Providers: seriousbroker.it  This test is not yet approved or cleared by the [place]  FDA and has been  authorized for detection and/or diagnosis of SARS-CoV-2 by FDA under an Emergency Use Authorization (EUA). This EUA will remain in effect (meaning this test can be used) for the duration of the COVID-19 declaration under Section 564(b)(1) of the Act, 21 U.S.C. section 360bbb-3(b)(1), unless the authorization is terminated or revoked.  Performed  at [facility], [address] [place], [place] [address]   Blood culture (routine x 2)     Status: None   Collection Time: 05/18/24  2:47 PM   Specimen: BLOOD  Result Value Ref Range Status   Specimen Description   Final    BLOOD LEFT ANTECUBITAL Performed at [facility], [address] [place], [place] [address]    Special Requests   Final    BOTTLES DRAWN AEROBIC AND ANAEROBIC Blood Culture adequate volume Performed at [facility], [address] [place], [place] [address]    Culture   Final    NO GROWTH 5 DAYS Performed at [facility], [address] [place], [place] [address]    Report Status 05/23/2024 FINAL  Final  Urine Culture (for pregnant, neutropenic or urologic patients or patients with an indwelling urinary catheter)     Status: Abnormal   Collection Time: 05/18/24  3:18 PM   Specimen: In/Out Cath Urine  Result Value Ref Range Status   Specimen Description   Final    IN/OUT CATH URINE Performed at [facility], [address] [place], [place] [address]    Special Requests   Final    NONE Performed at [facility], [address] [place], [place] [address]    Culture (A)  Final    <10,000 COLONIES/mL INSIGNIFICANT GROWTH Performed at [facility], [address] [place], [place] [address]    Report Status 05/20/2024 FINAL  Final  Culture, blood (Routine X 2) w Reflex to ID Panel     Status: None   Collection Time: 05/19/24 10:25 AM   Specimen: BLOOD RIGHT HAND  Result Value Ref Range Status   Specimen Description   Final    BLOOD RIGHT  HAND Performed at [facility], [address], [place], [place] [address]    Special Requests   Final    BOTTLES DRAWN AEROBIC ONLY Blood Culture results may not be optimal due to an inadequate volume of blood received in culture bottles Performed at [facility], [address], [place], [place] [address]    Culture   Final    NO GROWTH 5 DAYS Performed at [facility], [address] [place], [place] [address]    Report Status 05/24/2024 FINAL  Final     Time coordinating discharge: 35 minutes  SIGNED:   Sophie Mao, MD  [facility] 05/28/2024, 12:47 PM      [1]  Allergies Allergen Reactions   Lisinopril Cough

## 2024-05-28 NOTE — Progress Notes (Signed)
 Physical Therapy Treatment Patient Details Name: Susan Whitaker MRN: [id number] DOB: Sep 30, 1970 Today's Date: 05/28/2024   History of Present Illness 54 y.o. female with medical history significant for well-controlled type 2 diabetes, GERD, hypertension, hyperlipidemia, gastric adenocarcinoma on neoadjuvant chemotherapy being admitted to the hospital with neutropenic fever, weakness    PT Comments  Pt in bed with family at bedside. She declined OOB mobility wanting to save energy for discharge this afternoon. PT spent time educating and training family on manual WC parts and management with time for family to have hands on practice. PT continues to recommend HHPT at discharge with continued support and physical assistance from family.    If plan is discharge home, recommend the following: Two people to help with walking and/or transfers;A lot of help with bathing/dressing/bathroom;Assistance with cooking/housework;Assist for transportation;Help with stairs or ramp for entrance;Direct supervision/assist for medications management   Can travel by private vehicle     Yes  Equipment Recommendations  Wheelchair (measurements PT);Wheelchair cushion (measurements PT);Hospital bed    Recommendations for Other Services       Precautions / Restrictions Precautions Precautions: Fall Recall of Precautions/Restrictions: Impaired Restrictions Weight Bearing Restrictions Per Provider Order: No     Mobility  Bed Mobility               General bed mobility comments: pt remained in bed for session requesting to save energy for discharge this afternoon    Transfers                        Ambulation/Gait                   Stairs             Wheelchair Mobility     Tilt Bed    Modified Rankin (Stroke Patients Only)       Balance                                            Communication Communication Communication: No apparent  difficulties Factors Affecting Communication: Reduced clarity of speech  Cognition Arousal: Alert Behavior During Therapy: Flat affect   PT - Cognitive impairments: History of cognitive impairments                       PT - Cognition Comments: pt with baseline cognitive deficits, frequently repeating same story and information to PT. Family present to aid in communication and level of support for discharge Following commands: Intact Following commands impaired: Follows one step commands with increased time    Cueing Cueing Techniques: Verbal cues, Tactile cues, Gestural cues, Visual cues  Exercises      General Comments General comments (skin integrity, edema, etc.): session focused on family training regarding new WC and part management. PT demoed all parts including donning/doffing elevating leg rests, adjusting leg rest length, removable armrests, collapsing WC for car, and ways for pt to move in [facility] on her own using UE and/or LE. Time spent with family practicing [facility] management.      Pertinent Vitals/Pain Pain Assessment Pain Assessment: Faces Faces Pain Scale: Hurts a little bit Pain Location: generalized Pain Descriptors / Indicators: Discomfort, Grimacing Pain Intervention(s): Monitored during session    Home Living  Prior Function            PT Goals (current goals can now be found in the care plan section) Acute Rehab PT Goals Patient Stated Goal: get better PT Goal Formulation: With patient/family Time For Goal Achievement: 06/04/24 Potential to Achieve Goals: Good Progress towards PT goals: Progressing toward goals    Frequency    Min 2X/week      PT Plan      Co-evaluation              AM-PAC PT 6 Clicks Mobility   Outcome Measure  Help needed turning from your back to your side while in a flat bed without using bedrails?: A Lot Help needed moving from lying on your back to sitting on the side of  a flat bed without using bedrails?: A Lot Help needed moving to and from a bed to a chair (including a wheelchair)?: A Lot Help needed standing up from a chair using your arms (e.g., wheelchair or bedside chair)?: A Lot Help needed to walk in hospital room?: Total Help needed climbing 3-5 steps with a railing? : Total 6 Click Score: 10    End of Session   Activity Tolerance: Patient tolerated treatment well;Other (comment) (session focused on family training with manual WC) Patient left: in bed;with call bell/phone within reach;with bed alarm set;with family/visitor present Nurse Communication: Other (comment) (discharge) PT Visit Diagnosis: Muscle weakness (generalized) (M62.81);Difficulty in walking, not elsewhere classified (R26.2)     Time: 8677-8665 PT Time Calculation (min) (ACUTE ONLY): 12 min  Charges:    $Wheel Chair Management: 8-22 mins                       Isaiah DEL. Onalee Steinbach, PT, DPT   [employer] 05/28/2024, 1:39 PM

## 2024-05-28 NOTE — Progress Notes (Signed)
 " PROGRESS NOTE    Susan Whitaker  FMW:[id number] DOB: [birth year] DOA: 05/18/2024 PCP: Katheen Roselie Rockford, NP   Brief Narrative:  54 y.o. female with medical history significant for well-controlled type 2 diabetes, GERD, hypertension, hyperlipidemia, gastric adenocarcinoma on neoadjuvant chemotherapy presented with weakness, lethargy, poor oral intake due to mucositis with nausea and is currently admitted for neutropenic fever and mucositis.  She was treated with broad-spectrum antibiotics.  Oncology following.  PT recommending SNF placement.  Patient/family not interested in SNF placement.  Assessment & Plan:   Neutropenic fever Pancytopenia Stage Ib gastric adenocarcinoma status post recent chemotherapy Mucositis Severe sepsis: Present on admission -Oncology signed off on 05/24/2024 and recommended outpatient oncology follow-up: filgrastim  discontinued on 05/24/2024 as neutropenia has resolved but was restarted on 05/25/2024 by oncology due to neutropenia again.  Neutropenic today as well.  Hemoglobin 9.1 today: Received 1 unit packed red cell transfusion on 05/25/2024.  Monitor CBC daily. -Platelets 32 today.  No signs of bleeding.  Transfuse if platelets less than 10 or if there is evidence of active bleeding.  - Sepsis has resolved.  No temperature spikes over the last several days.  Blood cultures have remained negative so far.  Urine cultures showed insignificant growth.  Completed 7 days course of cefepime . -Continue miracle mouthwash as needed.  Oral intake improving.  Diet as tolerated  Hypernatremia Dehydration - Hypernatremia is resolved. -IV fluids discontinued on 05/24/2024.  Encourage oral intake  Acute metabolic acidosis - Resolved  Elevated LFTs - Questionable cause.  Improving.  Monitor intermittently  Hypokalemia - Improved  History of diet-controlled diabetes mellitus type 2 with hypoglycemia - Blood sugars improving.  Off of IV fluids  Physical deconditioning -PT  recommending SNF placement.  TOC following.  Patient declining SNF.  Will need home health PT  Severe malnutrition - Follow nutrition recommendations  DVT prophylaxis: None due to thrombocytopenia Code Status: Full Family Communication: daughter at bedside Disposition Plan: Status is: Inpatient Remains inpatient appropriate because: Of severity of illness.  Continued neutropenia    Consultants: Oncology  Procedures: None  Antimicrobials:  Anti-infectives (From admission, onward)    Start     Dose/Rate Route Frequency Ordered Stop   05/19/24 1900  vancomycin  (VANCOCIN ) IVPB 1000 mg/200 mL premix  Status:  Discontinued        1,000 mg 200 mL/hr over 60 Minutes Intravenous Every 24 hours 05/18/24 1919 05/21/24 1200   05/19/24 1600  ceFEPIme  (MAXIPIME ) 2 g in sodium chloride  0.9 % 100 mL IVPB        2 g 200 mL/hr over 30 Minutes Intravenous Every 8 hours 05/19/24 1123 05/25/24 1624   05/19/24 1000  metroNIDAZOLE  (FLAGYL ) IVPB 500 mg  Status:  Discontinued        500 mg 100 mL/hr over 60 Minutes Intravenous Every 12 hours 05/19/24 0815 05/21/24 1200   05/19/24 0500  ceFEPIme  (MAXIPIME ) 2 g in sodium chloride  0.9 % 100 mL IVPB  Status:  Discontinued        2 g 200 mL/hr over 30 Minutes Intravenous Every 12 hours 05/18/24 1919 05/19/24 1123   05/18/24 1930  vancomycin  (VANCOCIN ) IVPB 1000 mg/200 mL premix       Placed in Followed by Linked Group   1,000 mg 200 mL/hr over 60 Minutes Intravenous  Once 05/18/24 1920 05/18/24 2149   05/18/24 1930  vancomycin  (VANCOCIN ) 500 mg in sodium chloride  0.9 % 100 mL IVPB       Placed in Followed by [employer]  [facility]   500 mg 100 mL/hr over 60 Minutes Intravenous  Once 05/18/24 1920 05/18/24 2152   05/18/24 1630  ceFEPIme  (MAXIPIME ) 2 g in sodium chloride  0.9 % 100 mL IVPB        2 g 200 mL/hr over 30 Minutes Intravenous  Once 05/18/24 1627 05/18/24 1749   05/18/24 1630  metroNIDAZOLE  (FLAGYL ) IVPB 500 mg        500 mg 100 mL/hr over 60  Minutes Intravenous  Once 05/18/24 1627 05/18/24 1854   05/18/24 1630  vancomycin  (VANCOCIN ) IVPB 1000 mg/200 mL premix  Status:  Discontinued        1,000 mg 200 mL/hr over 60 Minutes Intravenous  Once 05/18/24 1627 05/18/24 1920         Subjective: Patient seen and examined at bedside.  Denies worsening shortness of breath, abdominal pain, vomiting.  Wants to go home today. objective: Vitals:   05/27/24 1347 05/27/24 1954 05/27/24 2106 05/28/24 0431  BP: (!) 96/59 94/60  (!) 100/56  Pulse: 75 66  62  Resp:  18  16  Temp: 97.9 F (36.6 C) 99.5 F (37.5 C) 98.4 F (36.9 C) 98.1 F (36.7 C)  TempSrc: Oral Oral Oral Oral  SpO2: 100% 100%  98%  Weight:      Height:        Intake/Output Summary (Last 24 hours) at 05/28/2024 0736 Last data filed at 05/27/2024 2023 Gross per 24 hour  Intake 876.19 ml  Output --  Net 876.19 ml   Filed Weights   05/18/24 1420 05/19/24 1323  Weight: 58.8 kg 58 kg    Examination:  General: On room air currently.  No distress.  Chronically ill and deconditioned appearing. ENT/neck: No obvious thyromegaly or elevated JVD noted respiratory: Bilateral decreased breath sounds at bases with some crackles  CVS: S1-S2 heard.  Rate mostly controlled  abdominal: Soft, nontender, remains slightly distended; no organomegaly, normal bowel sounds heard  extremities: No clubbing; mild lower extremity edema present CNS: No obvious focal deficits noted.  Alert, awake.   Lymph: No palpable lymphadenopathy noted skin: No obvious ecchymosis/lesions psych: Affect is mostly flat  musculoskeletal: No obvious joint swelling/deformity    Data Reviewed: I have personally reviewed following labs and imaging studies  CBC: Recent Labs  Lab 05/24/24 0258 05/25/24 0809 05/25/24 1800 05/26/24 0330 05/27/24 0303 05/28/24 0254  WBC 3.2* 3.0*  --  2.7* 3.5* 4.4  NEUTROABS 1.6* 0.4*  --  0.3* 0.2* 0.1*  HGB 7.0* 7.1* 9.1* 8.8* 9.0* 9.1*  HCT 20.6* 20.4* 25.5*  24.7* 24.9* 25.8*  MCV 89.6 87.9  --  85.5 85.6 86.0  PLT 40* 29*  --  19* 23* 32*   Basic Metabolic Panel: Recent Labs  Lab 05/24/24 0258 05/25/24 0809 05/26/24 0330 05/27/24 0303 05/28/24 0254  NA 140 143 137 137 137  K 2.7* 3.6 2.4* 2.9* 4.0  CL 107 111 101 101 103  CO2 24 24 27 28 28   GLUCOSE 128* 198* 110* 108* 92  BUN 10 11 8 9 6   CREATININE 0.44 0.42* 0.39* 0.43* 0.53  CALCIUM  8.0* 7.8* 8.1* 8.3* 8.7*  MG 1.9 2.0 2.0 2.0 1.9   GFR: Estimated Creatinine Clearance: 65.7 mL/min (by C-G formula based on SCr of 0.53 mg/dL). Liver Function Tests: Recent Labs  Lab 05/22/24 0512 05/23/24 0344 05/25/24 0809 05/27/24 0303  AST 78* 70* 46* 25  ALT 59* 48* 32 21  ALKPHOS 52 57 49 50  BILITOT 0.9 1.0 0.8  0.9  PROT 6.4* 6.7 6.2* 6.1*  ALBUMIN  2.9* 3.1* 2.9* 3.1*   No results for input(s): LIPASE, AMYLASE in the last 168 hours.  No results for input(s): AMMONIA in the last 168 hours.  Coagulation Profile: No results for input(s): INR, PROTIME in the last 168 hours. Cardiac Enzymes: No results for input(s): CKTOTAL, CKMB, CKMBINDEX, TROPONINI in the last 168 hours. BNP (last 3 results) No results for input(s): PROBNP in the last 8760 hours. HbA1C: No results for input(s): HGBA1C in the last 72 hours. CBG: Recent Labs  Lab 05/27/24 0752 05/27/24 1214 05/27/24 1644 05/27/24 2021 05/28/24 0734  GLUCAP 139* 109* 128* 142* 94   Lipid Profile: No results for input(s): CHOL, HDL, LDLCALC, TRIG, CHOLHDL, LDLDIRECT in the last 72 hours. Thyroid  Function Tests: No results for input(s): TSH, T4TOTAL, FREET4, T3FREE, THYROIDAB in the last 72 hours. Anemia Panel: No results for input(s): VITAMINB12, FOLATE, FERRITIN, TIBC, IRON, RETICCTPCT in the last 72 hours. Sepsis Labs: No results for input(s): PROCALCITON, LATICACIDVEN in the last 168 hours.   Recent Results (from the past 240 hours)  Resp panel by  RT-PCR (RSV, Flu A&B, Covid) Anterior Nasal Swab     Status: None   Collection Time: 05/18/24  2:26 PM   Specimen: Anterior Nasal Swab  Result Value Ref Range Status   SARS Coronavirus 2 by RT PCR NEGATIVE NEGATIVE Final    Comment: (NOTE) SARS-CoV-2 target nucleic acids are NOT DETECTED.  The SARS-CoV-2 RNA is generally detectable in upper respiratory specimens during the acute phase of infection. The lowest concentration of SARS-CoV-2 viral copies this assay can detect is 138 copies/mL. A negative result does not preclude SARS-Cov-2 infection and should not be used as the sole basis for treatment or other patient management decisions. A negative result may occur with  improper specimen collection/handling, submission of specimen other than nasopharyngeal swab, presence of viral mutation(s) within the areas targeted by this assay, and inadequate number of viral copies(<138 copies/mL). A negative result must be combined with clinical observations, patient history, and epidemiological information. The expected result is Negative.  Fact Sheet for Patients:  bloggercourse.com  Fact Sheet for Healthcare Providers:  seriousbroker.it  This test is no t yet approved or cleared by the [place]  FDA and  has been authorized for detection and/or diagnosis of SARS-CoV-2 by FDA under an Emergency Use Authorization (EUA). This EUA will remain  in effect (meaning this test can be used) for the duration of the COVID-19 declaration under Section 564(b)(1) of the Act, 21 U.S.C.section 360bbb-3(b)(1), unless the authorization is terminated  or revoked sooner.       Influenza A by PCR NEGATIVE NEGATIVE Final   Influenza B by PCR NEGATIVE NEGATIVE Final    Comment: (NOTE) The Xpert Xpress SARS-CoV-2/FLU/RSV plus assay is intended as an aid in the diagnosis of influenza from Nasopharyngeal swab specimens and should not be used as a sole basis  for treatment. Nasal washings and aspirates are unacceptable for Xpert Xpress SARS-CoV-2/FLU/RSV testing.  Fact Sheet for Patients: bloggercourse.com  Fact Sheet for Healthcare Providers: seriousbroker.it  This test is not yet approved or cleared by the [place]  FDA and has been authorized for detection and/or diagnosis of SARS-CoV-2 by FDA under an Emergency Use Authorization (EUA). This EUA will remain in effect (meaning this test can be used) for the duration of the COVID-19 declaration under Section 564(b)(1) of the Act, 21 U.S.C. section 360bbb-3(b)(1), unless the authorization is terminated or revoked.  Resp Syncytial Virus by PCR NEGATIVE NEGATIVE Final    Comment: (NOTE) Fact Sheet for Patients: bloggercourse.com  Fact Sheet for Healthcare Providers: seriousbroker.it  This test is not yet approved or cleared by the [place]  FDA and has been authorized for detection and/or diagnosis of SARS-CoV-2 by FDA under an Emergency Use Authorization (EUA). This EUA will remain in effect (meaning this test can be used) for the duration of the COVID-19 declaration under Section 564(b)(1) of the Act, 21 U.S.C. section 360bbb-3(b)(1), unless the authorization is terminated or revoked.  Performed at [facility], [address] [place], [place] [address]   Blood culture (routine x 2)     Status: None   Collection Time: 05/18/24  2:47 PM   Specimen: BLOOD  Result Value Ref Range Status   Specimen Description   Final    BLOOD LEFT ANTECUBITAL Performed at [facility], [address] [place], [place] [address]    Special Requests   Final    BOTTLES DRAWN AEROBIC AND ANAEROBIC Blood Culture adequate volume Performed at [facility], [address] [place], [place] [address]    Culture   Final    NO GROWTH 5 DAYS Performed at [facility], [address], [place], [place] [address]    Report Status 05/23/2024 FINAL  Final  Urine Culture (for pregnant, neutropenic or urologic patients or patients with an indwelling urinary catheter)     Status: Abnormal   Collection Time: 05/18/24  3:18 PM   Specimen: In/Out Cath Urine  Result Value Ref Range Status   Specimen Description   Final    IN/OUT CATH URINE Performed at [facility], [address] [place], [place] [address]    Special Requests   Final    NONE Performed at [facility], [address] [place], [place] [address]    Culture (A)  Final    <10,000 COLONIES/mL INSIGNIFICANT GROWTH Performed at [facility], [address], [place], [place] [address]    Report Status 05/20/2024 FINAL  Final  Culture, blood (Routine X 2) w Reflex to ID Panel     Status: None   Collection Time: 05/19/24 10:25 AM   Specimen: BLOOD RIGHT HAND  Result Value Ref Range Status   Specimen Description   Final    BLOOD RIGHT HAND Performed at [facility], [address] [place], [place] [address]    Special Requests   Final    BOTTLES DRAWN AEROBIC ONLY Blood Culture results may not be optimal due to an inadequate volume of blood received in culture bottles Performed at [facility], [address] [place], [place] [address]    Culture   Final    NO GROWTH 5 DAYS Performed at [facility], [address], [place], [place] [address]    Report Status 05/24/2024 FINAL  Final         Radiology Studies: No results found.      Scheduled Meds:  amLODipine   5 mg Oral Daily   chlorhexidine   10 mL Mouth/Throat QID   Chlorhexidine  Gluconate Cloth  6 each Topical Daily   feeding supplement  1 Container Oral TID BM   filgrastim  (NIVESTYM ) SQ  300 mcg Subcutaneous Daily   insulin  aspart  0-5 Units Subcutaneous QHS   insulin  aspart  0-6 Units Subcutaneous TID WC   magic mouthwash  w/lidocaine   5 mL Oral QID    mouth rinse  15 mL Mouth Rinse 4 times per day   potassium chloride   40 mEq Oral BID   sodium chloride  flush  10-40 mL Intracatheter Q12H   Continuous Infusions:          Sophie Mao, MD [facility] 05/28/2024, 7:36 AM   "

## 2024-05-29 ENCOUNTER — Telehealth: Payer: Self-pay | Admitting: *Deleted

## 2024-05-29 NOTE — Transitions of Care (Post Inpatient/ED Visit) (Signed)
 "  05/29/2024  Name: Susan Whitaker MRN: [id number] DOB: 1970/08/25  Today's TOC FU Call Status: Today's TOC FU Call Status:: Successful TOC FU Call Completed TOC FU Call Complete Date: 05/29/24  Patient's Name and Date of Birth confirmed. Name, DOB  Transition Care Management Follow-up Telephone Call Date of Discharge: 05/28/24 Discharge Facility: [facility]) Type of Discharge: Inpatient Admission Primary Inpatient Discharge Diagnosis:: Neutropenic fever How have you been since you were released from the hospital?: Better Any questions or concerns?: No  Items Reviewed: Did you receive and understand the discharge instructions provided?: No Dietary orders reviewed?: Yes Type of Diet Ordered:: Neutropenic Do you have support at home?: Yes People in Home [RPT]: child(ren), adult  Medications Reviewed Today: Medications Reviewed Today     Reviewed by Lucky Andrea LABOR, RN (Registered Nurse) on 05/29/24 at 1407  Med List Status: <None>   Medication Order Taking? Sig Documenting Provider Last Dose Status Informant  acetaminophen  (TYLENOL ) 500 MG tablet [id number] Yes Take 500 mg by mouth every 6 (six) hours as needed for moderate pain (pain score 4-6). [provider]  Active Child  amLODipine  (NORVASC ) 5 MG tablet [id number] Yes Take 1 tablet (5 mg total) by mouth daily. Nche, Roselie Rockford, NP  Active Child  atorvastatin  (LIPITOR) 80 MG tablet [id number] Yes Take 1 tablet (80 mg total) by mouth daily. Thapa, Sudan, MD  Active Child  dexamethasone  (DECADRON ) 4 MG tablet [id number]  Take 2 tablets (8 mg total) by mouth daily. Start the day after chemotherapy for 2 days. Take with food.  Patient not taking: Reported on 05/29/2024   Lanny Callander, MD  Active Child  diphenhydrAMINE -nystatin -alum & mag hydroxide-simeth-lidocaine  [id number] Yes Take 5 mLs by mouth 4 (four) times daily as needed. Hanford Powell BRAVO, NP  Active Child  feeding supplement (ENSURE PLUS HIGH PROTEIN) LIQD  [id number] Yes Take 237 mLs by mouth 2 (two) times daily between meals. Aron Shoulders, MD  Active Child  Glucose Blood (BLOOD GLUCOSE TEST STRIPS) STRP [id number] Yes Use 1 each at morning, noon, and bedtime to check blood glucose. Thapa, Sudan, MD  Active Child  levonorgestrel  (MIRENA , 52 MG,) 20 MCG/DAY IUD [id number] Yes 1 each by Intrauterine route once. [provider]  Active Child           Med Note JACKOLYN WADDELL DEL   Dju May 19, 2024  8:01 AM)    lidocaine -prilocaine  (EMLA ) cream [id number] Yes Apply to affected area once. Lanny Callander, MD  Active Child  ondansetron  (ZOFRAN ) 8 MG tablet [id number] Yes Take 1 tablet (8 mg total) by mouth every 8 (eight) hours as needed for nausea or vomiting. Start on the third day after chemotherapy. Lanny Callander, MD  Active Child  oxyCODONE  (OXY IR/ROXICODONE ) 5 MG immediate release tablet [id number] Yes Take 1-2 tablets (5-10 mg total) by mouth every 4 (four) hours as needed for moderate pain (pain score 4-6). Aron Shoulders, MD  Active Child  polyethylene glycol (MIRALAX  / GLYCOLAX ) 17 g packet [id number] Yes Take 17 g by mouth daily as needed for moderate constipation. [provider]  Active Child           Med Note SOILA, LYLE BROCKS   Thu Jan 05, 2024 10:45 AM)    potassium chloride  SA (KLOR-CON  M) 20 MEQ tablet [id number] Yes Take 2 tablets (40 mEq total) by mouth 2 (two) times daily. Correction to previous prescription Cheryle Page, MD  Active   prochlorperazine  (COMPAZINE ) 10 MG tablet [id number] Yes  Take 1 tablet (10 mg total) by mouth every 6 (six) hours as needed for nausea or vomiting. Lanny Callander, MD  Active Child  Med List Note Carmella Asberry FALCON [facility] 02/10/24 1424): Xeloda  filled through [facility]            Home Care and Equipment/Supplies: Were Home Health Services Ordered?: Yes Name of Home Health Agency:: [facility] Has Agency set up a time to come to your home?: Yes (Patient is unable to recall the date of  [facility]) Any new equipment or medical supplies ordered?: Yes Name of Medical supply agency?: [employer] for 3n1 and wheelchair Were you able to get the equipment/medical supplies?: Yes Do you have any questions related to the use of the equipment/supplies?: No  Functional Questionnaire: Do you need assistance with bathing/showering or dressing?: No Do you need assistance with meal preparation?: No Do you need assistance with eating?: No Do you have difficulty maintaining continence: No Do you need assistance with getting out of bed/getting out of a chair/moving?: No Do you have difficulty managing or taking your medications?: No  Follow up appointments reviewed: PCP Follow-up appointment confirmed?: Yes Date of PCP follow-up appointment?: 06/21/24 Follow-up Provider: PCP Specialist Hospital Follow-up appointment confirmed?: Yes Date of Specialist follow-up appointment?: 06/07/24 Follow-Up Specialty Provider:: Oncology Do you need transportation to your follow-up appointment?: No Do you understand care options if your condition(s) worsen?: Yes-patient verbalized understanding  SDOH Interventions Today    Flowsheet Row Most Recent Value  SDOH Interventions   Food Insecurity Interventions Intervention Not Indicated  Housing Interventions Intervention Not Indicated  Transportation Interventions Intervention Not Indicated  Utilities Interventions Intervention Not Indicated    Andrea Dimes RN, BSN [place]  [facility] RN Care Manager [phone]  "

## 2024-05-30 ENCOUNTER — Other Ambulatory Visit: Payer: Self-pay

## 2024-05-30 DIAGNOSIS — C169 Malignant neoplasm of stomach, unspecified: Secondary | ICD-10-CM

## 2024-05-31 ENCOUNTER — Inpatient Hospital Stay

## 2024-05-31 ENCOUNTER — Telehealth: Payer: Self-pay

## 2024-05-31 ENCOUNTER — Other Ambulatory Visit: Payer: Self-pay

## 2024-05-31 ENCOUNTER — Inpatient Hospital Stay: Attending: Hematology

## 2024-05-31 DIAGNOSIS — T451X5A Adverse effect of antineoplastic and immunosuppressive drugs, initial encounter: Secondary | ICD-10-CM | POA: Diagnosis not present

## 2024-05-31 DIAGNOSIS — C169 Malignant neoplasm of stomach, unspecified: Secondary | ICD-10-CM

## 2024-05-31 DIAGNOSIS — R5383 Other fatigue: Secondary | ICD-10-CM | POA: Diagnosis not present

## 2024-05-31 DIAGNOSIS — Z9221 Personal history of antineoplastic chemotherapy: Secondary | ICD-10-CM | POA: Insufficient documentation

## 2024-05-31 DIAGNOSIS — D649 Anemia, unspecified: Secondary | ICD-10-CM | POA: Diagnosis not present

## 2024-05-31 DIAGNOSIS — I9589 Other hypotension: Secondary | ICD-10-CM | POA: Insufficient documentation

## 2024-05-31 DIAGNOSIS — E538 Deficiency of other specified B group vitamins: Secondary | ICD-10-CM | POA: Insufficient documentation

## 2024-05-31 DIAGNOSIS — E860 Dehydration: Secondary | ICD-10-CM

## 2024-05-31 LAB — CMP (CANCER CENTER ONLY)
ALT: 16 U/L (ref 0–44)
AST: 37 U/L (ref 15–41)
Albumin: 3.4 g/dL — ABNORMAL LOW (ref 3.5–5.0)
Alkaline Phosphatase: 107 U/L (ref 38–126)
Anion gap: 10 (ref 5–15)
BUN: 8 mg/dL (ref 6–20)
CO2: 26 mmol/L (ref 22–32)
Calcium: 9.4 mg/dL (ref 8.9–10.3)
Chloride: 106 mmol/L (ref 98–111)
Creatinine: 0.71 mg/dL (ref 0.44–1.00)
GFR, Estimated: >60 mL/min
Glucose, Bld: 107 mg/dL — ABNORMAL HIGH (ref 70–99)
Potassium: 4.2 mmol/L (ref 3.5–5.1)
Sodium: 142 mmol/L (ref 135–145)
Total Bilirubin: 0.7 mg/dL (ref 0.0–1.2)
Total Protein: 7.1 g/dL (ref 6.5–8.1)

## 2024-05-31 LAB — CBC WITH DIFFERENTIAL (CANCER CENTER ONLY)
Abs Immature Granulocytes: 5.18 K/uL — ABNORMAL HIGH (ref 0.00–0.07)
Basophils Absolute: 0.1 K/uL (ref 0.0–0.1)
Basophils Relative: 0 %
Eosinophils Absolute: 0 K/uL (ref 0.0–0.5)
Eosinophils Relative: 0 %
HCT: 29.8 % — ABNORMAL LOW (ref 36.0–46.0)
Hemoglobin: 9.9 g/dL — ABNORMAL LOW (ref 12.0–15.0)
Immature Granulocytes: 16 %
Lymphocytes Relative: 25 %
Lymphs Abs: 8 K/uL — ABNORMAL HIGH (ref 0.7–4.0)
MCH: 29.6 pg (ref 26.0–34.0)
MCHC: 33.2 g/dL (ref 30.0–36.0)
MCV: 89 fL (ref 80.0–100.0)
Monocytes Absolute: 6.7 K/uL — ABNORMAL HIGH (ref 0.1–1.0)
Monocytes Relative: 21 %
Neutro Abs: 12.2 K/uL — ABNORMAL HIGH (ref 1.7–7.7)
Neutrophils Relative %: 38 %
Platelet Count: 196 K/uL (ref 150–400)
RBC: 3.35 MIL/uL — ABNORMAL LOW (ref 3.87–5.11)
RDW: 20.4 % — ABNORMAL HIGH (ref 11.5–15.5)
WBC Count: 32.3 K/uL — ABNORMAL HIGH (ref 4.0–10.5)
nRBC: 10.1 % — ABNORMAL HIGH (ref 0.0–0.2)

## 2024-05-31 LAB — GENETIC SCREENING ORDER

## 2024-05-31 MED ORDER — PEGFILGRASTIM INJECTION 6 MG/0.6ML ~~LOC~~: 6.0000 mg | PREFILLED_SYRINGE | Freq: Once | SUBCUTANEOUS | Status: DC

## 2024-05-31 NOTE — Progress Notes (Signed)
 Per Dr. Lanny, ANC is 12.4 so no need for Neulasta  injection today. Order has been discontinued.  Harlene Nasuti, PharmD Oncology Infusion Pharmacist 05/31/2024 3:46 PM

## 2024-05-31 NOTE — Addendum Note (Signed)
 Addended by: TAMELA RAISIN R on: 05/31/2024 03:47 PM   Modules accepted: Orders

## 2024-05-31 NOTE — Progress Notes (Signed)
 No injection needed per MD. Patient discharged.

## 2024-05-31 NOTE — Telephone Encounter (Signed)
 Copied from CRM [id number]. Topic: Clinical - Medical Advice >> May 31, 2024 12:33 PM Alexandria E wrote: Reason for CRM: FYI - Patient had a conflicting doctor appointment today so Deane (PT with [facility]) wanted to relay that start of care will happen tomorrow 1/16. Please call Deane at [phone] to approve.

## 2024-06-01 NOTE — Telephone Encounter (Signed)
 This encounter was created in error - please disregard.

## 2024-06-01 NOTE — Telephone Encounter (Unsigned)
 Copied from CRM [id number]. Topic: Clinical - Home Health Verbal Orders >> Jun 01, 2024  3:32 PM Alfonso HERO wrote: Caller/Agency: [facility] Callback Number: [phone] Service Requested: Physical Therapy Frequency: 1 x a week for 4 weeks Any new concerns about the patient? No

## 2024-06-01 NOTE — Telephone Encounter (Signed)
 Patient called and is scheduled for Friday 06/08/24 at 140 PM

## 2024-06-04 ENCOUNTER — Telehealth: Payer: Self-pay

## 2024-06-04 ENCOUNTER — Inpatient Hospital Stay

## 2024-06-04 ENCOUNTER — Inpatient Hospital Stay: Admitting: Hematology

## 2024-06-04 NOTE — Telephone Encounter (Signed)
 Called Almiria at [facility] and informed her of Susan Whitaker's verbal okay for patient to have PT 1x week for 4 weeks. She thanked me for calling and asked for my name and title. Information of my name and title were givien.

## 2024-06-04 NOTE — Telephone Encounter (Signed)
 Copied from CRM [id number]. Topic: Clinical - Home Health Verbal Orders >> Jun 04, 2024  3:14 PM Viola FALCON wrote: Caller/Agency: Will from [facility]  Callback Number: [phone] Service Requested: Occupational Therapy Frequency: 2x a week for 4 weeks  Any new concerns about the patient? No

## 2024-06-06 ENCOUNTER — Inpatient Hospital Stay

## 2024-06-06 NOTE — Telephone Encounter (Signed)
 Called the number listed for Will the OT for [facility] and informed him that Roselie is okay with patient have OT 2x/wk for 4 weeks. He thanked me for calling and stated that he will add it to his note.

## 2024-06-07 ENCOUNTER — Inpatient Hospital Stay: Admitting: Nutrition

## 2024-06-07 ENCOUNTER — Inpatient Hospital Stay

## 2024-06-07 ENCOUNTER — Inpatient Hospital Stay: Admitting: Nurse Practitioner

## 2024-06-08 ENCOUNTER — Encounter: Payer: Self-pay | Admitting: Nurse Practitioner

## 2024-06-08 ENCOUNTER — Other Ambulatory Visit (HOSPITAL_BASED_OUTPATIENT_CLINIC_OR_DEPARTMENT_OTHER): Payer: Self-pay

## 2024-06-08 ENCOUNTER — Ambulatory Visit: Admitting: Nurse Practitioner

## 2024-06-08 ENCOUNTER — Encounter: Payer: Self-pay | Admitting: Hematology

## 2024-06-08 VITALS — BP 108/68 | HR 99 | Ht 63.0 in | Wt 137.6 lb

## 2024-06-08 DIAGNOSIS — E1169 Type 2 diabetes mellitus with other specified complication: Secondary | ICD-10-CM | POA: Diagnosis not present

## 2024-06-08 DIAGNOSIS — F1190 Opioid use, unspecified, uncomplicated: Secondary | ICD-10-CM

## 2024-06-08 DIAGNOSIS — K5903 Drug induced constipation: Secondary | ICD-10-CM | POA: Diagnosis not present

## 2024-06-08 DIAGNOSIS — K641 Second degree hemorrhoids: Secondary | ICD-10-CM

## 2024-06-08 DIAGNOSIS — M79604 Pain in right leg: Secondary | ICD-10-CM

## 2024-06-08 DIAGNOSIS — I10 Essential (primary) hypertension: Secondary | ICD-10-CM | POA: Diagnosis not present

## 2024-06-08 DIAGNOSIS — M79605 Pain in left leg: Secondary | ICD-10-CM

## 2024-06-08 DIAGNOSIS — E559 Vitamin D deficiency, unspecified: Secondary | ICD-10-CM

## 2024-06-08 LAB — POCT GLYCOSYLATED HEMOGLOBIN (HGB A1C): Hemoglobin A1C: 6 % — AB (ref 4.0–5.6)

## 2024-06-08 MED ORDER — OXYCODONE HCL 5 MG PO TABS
5.0000 mg | ORAL_TABLET | ORAL | 0 refills | Status: AC | PRN
  Filled 2024-06-08: qty 14, 2d supply, fill #0

## 2024-06-08 MED ORDER — HYDROCORTISONE ACETATE 25 MG RE SUPP
25.0000 mg | Freq: Two times a day (BID) | RECTAL | 0 refills | Status: AC
  Filled 2024-06-08: qty 24, 12d supply, fill #0

## 2024-06-08 MED ORDER — SENNOSIDES-DOCUSATE SODIUM 8.6-50 MG PO TABS
1.0000 | ORAL_TABLET | Freq: Every day | ORAL | 3 refills | Status: DC
  Filled 2024-06-08: qty 100, 100d supply, fill #0

## 2024-06-08 MED ORDER — POLYETHYLENE GLYCOL 3350 17 GM/SCOOP PO POWD
17.0000 g | Freq: Every day | ORAL | 11 refills | Status: AC
  Filled 2024-06-08: qty 510, 30d supply, fill #0

## 2024-06-08 MED ORDER — GABAPENTIN 100 MG PO CAPS
100.0000 mg | ORAL_CAPSULE | Freq: Every day | ORAL | 5 refills | Status: DC
  Filled 2024-06-08: qty 60, 30d supply, fill #0

## 2024-06-08 NOTE — Progress Notes (Unsigned)
 "               Established Patient Visit  Patient: Susan Whitaker   DOB: 12/16/1970   54 y.o. Female  MRN: [id number] Visit Date: 06/11/2024  Subjective:    Chief Complaint  Patient presents with   Hospitalization Follow-up    Recent Hospital discharge  issues with sleeping, itching worse at night, hemorrhoids    HPI Accompanied by 2daughters. Reviewed hospital discharge summary, lab results and radiology report. CBC and CMP repeated by oncology on 05/31/2024.  Chronic idiopathic constipation Acute on chronic pain due to current use of narcotic. She had developed painful external hemorrhoids as a result.  Encouraged to start miralax  in Am, senokot in PM, use of anusol  suppository BID, sitz bathe 1-2x/day, and maintain adequate oral hydration. F/up in 2weeks  Chronic narcotic use Secondary to bilateral leg pain post chemotherapy. Describes pain as burning sensation radiating from hip to feet. Rates pain at 10/10. This interferes with sleep. Current use of oxycodone  5-10mg  every 4hrs prn with minimal relief. She reports pain is worse at bedtime. She has upcoming appointment with oncologist on 06/14/2024  We discussed use of gabapentin  100-200mg  at hs. Advised about possible side effects (over sedation, confusion, respiratory suppression) when narcotic is combined with gabapentin . Advised not to administer oxycodone  or gabapentin  if these symptoms are present. Advised to use tylenol  650mg  every 8hrs for mild to moderate pain, oxycodone  for severe pain, and start gabapentin  100-200mg  at bedtime only. Daughters verbalized understanding.  Type 2 diabetes mellitus (HCC) Repeat hgbA1c at 6% Controlled DIABETES without use of medication at this time  HTN (hypertension) BP at goal with amlodipine  5mg  BP Readings from Last 3 Encounters:  06/08/24 108/68  05/31/24 111/80  05/28/24 (!) 100/56    Reviewed CMP completed by oncology F/up in 2weeks Consider decreasing med dose if BP remains  <100/70  Wt Readings from Last 3 Encounters:  06/08/24 137 lb 9.6 oz (62.4 kg)  05/19/24 127 lb 13.9 oz (58 kg)  05/07/24 146 lb 8 oz (66.5 kg)    Reviewed medical, surgical, and social history today  Medications: Show/hide medication list[1] Reviewed past medical and social history.   ROS per HPI above  Last CBC Lab Results  Component Value Date   WBC 32.3 (H) 05/31/2024   HGB 9.9 (L) 05/31/2024   HCT 29.8 (L) 05/31/2024   MCV 89.0 05/31/2024   MCH 29.6 05/31/2024   RDW 20.4 (H) 05/31/2024   PLT 196 05/31/2024   Last metabolic panel Lab Results  Component Value Date   GLUCOSE 107 (H) 05/31/2024   NA 142 05/31/2024   K 4.2 05/31/2024   CL 106 05/31/2024   CO2 26 05/31/2024   BUN 8 05/31/2024   CREATININE 0.71 05/31/2024   GFRNONAA >60 05/31/2024   CALCIUM  9.4 05/31/2024   PHOS 3.5 01/16/2024   PROT 7.1 05/31/2024   ALBUMIN  3.4 (L) 05/31/2024   BILITOT 0.7 05/31/2024   ALKPHOS 107 05/31/2024   AST 37 05/31/2024   ALT 16 05/31/2024   ANIONGAP 10 05/31/2024   Last lipids Lab Results  Component Value Date   CHOL 156 11/17/2023   HDL 47.60 11/17/2023   LDLCALC 93 11/17/2023   LDLDIRECT 158.0 08/05/2022   TRIG 75.0 11/17/2023   CHOLHDL 3 11/17/2023   Last hemoglobin A1c Lab Results  Component Value Date   HGBA1C 6.0 (A) 06/08/2024   Last thyroid  functions Lab Results  Component Value Date   TSH 1.40 08/16/2023  FREET4 0.69 08/02/2019   Last vitamin D  Lab Results  Component Value Date   VD25OH 21 (L) 08/30/2011      Objective:  BP 108/68 (BP Location: Right Arm, Patient Position: Sitting, Cuff Size: Normal)   Pulse 99   Ht 5' 3 (1.6 m)   Wt 137 lb 9.6 oz (62.4 kg)   LMP  (LMP Unknown)   SpO2 98%   BMI 24.37 kg/m      Physical Exam Vitals reviewed. Exam conducted with a chaperone present.  Cardiovascular:     Rate and Rhythm: Normal rate and regular rhythm.     Pulses: Normal pulses.     Heart sounds: Normal heart sounds.   Pulmonary:     Effort: Pulmonary effort is normal.     Breath sounds: Normal breath sounds.  Abdominal:     General: Bowel sounds are normal. There is no distension.     Palpations: Abdomen is soft.     Tenderness: There is abdominal tenderness. There is no guarding.  Genitourinary:    Rectum: Tenderness and external hemorrhoid present.  Neurological:     Mental Status: She is alert and oriented to person, place, and time.     Results for orders placed or performed in visit on 06/08/24  POCT glycosylated hemoglobin (Hb A1C)  Result Value Ref Range   Hemoglobin A1C 6.0 (A) 4.0 - 5.6 %   HbA1c POC (<> result, manual entry)     HbA1c, POC (prediabetic range)     HbA1c, POC (controlled diabetic range)        Assessment & Plan:    Problem List Items Addressed This Visit     Chronic idiopathic constipation   Acute on chronic pain due to current use of narcotic. She had developed painful external hemorrhoids as a result.  Encouraged to start miralax  in Am, senokot in PM, use of anusol  suppository BID, sitz bathe 1-2x/day, and maintain adequate oral hydration. F/up in 2weeks      Relevant Medications   polyethylene glycol powder (GLYCOLAX /MIRALAX ) 17 GM/SCOOP powder   senna-docusate (SENOKOT S) 8.6-50 MG tablet   Chronic narcotic use   Secondary to bilateral leg pain post chemotherapy. Describes pain as burning sensation radiating from hip to feet. Rates pain at 10/10. This interferes with sleep. Current use of oxycodone  5-10mg  every 4hrs prn with minimal relief. She reports pain is worse at bedtime. She has upcoming appointment with oncologist on 06/14/2024  We discussed use of gabapentin  100-200mg  at hs. Advised about possible side effects (over sedation, confusion, respiratory suppression) when narcotic is combined with gabapentin . Advised not to administer oxycodone  or gabapentin  if these symptoms are present. Advised to use tylenol  650mg  every 8hrs for mild to moderate pain,  oxycodone  for severe pain, and start gabapentin  100-200mg  at bedtime only. Daughters verbalized understanding.      Hemorrhoids   Relevant Medications   hydrocortisone  (ANUSOL -HC) 25 MG suppository   HTN (hypertension)   BP at goal with amlodipine  5mg  BP Readings from Last 3 Encounters:  06/08/24 108/68  05/31/24 111/80  05/28/24 (!) 100/56    Reviewed CMP completed by oncology F/up in 2weeks Consider decreasing med dose if BP remains <100/70      Type 2 diabetes mellitus (HCC) - Primary   Repeat hgbA1c at 6% Controlled DIABETES without use of medication at this time      Relevant Orders   POCT glycosylated hemoglobin (Hb A1C) (Completed)   Other Visit Diagnoses  Pain in both lower extremities       Relevant Medications   oxyCODONE  (OXY IR/ROXICODONE ) 5 MG immediate release tablet   gabapentin  (NEURONTIN ) 100 MG capsule      Return in about 2 weeks (around 06/22/2024) for hemorrhoid, itching and insomnia.     Roselie Mood, NP      [1]  Outpatient Medications Prior to Visit  Medication Sig   acetaminophen  (TYLENOL ) 500 MG tablet Take 500 mg by mouth every 6 (six) hours as needed for moderate pain (pain score 4-6).   amLODipine  (NORVASC ) 5 MG tablet Take 1 tablet (5 mg total) by mouth daily.   atorvastatin  (LIPITOR) 80 MG tablet Take 1 tablet (80 mg total) by mouth daily.   diphenhydrAMINE -nystatin -alum & mag hydroxide-simeth-lidocaine  Take 5 mLs by mouth 4 (four) times daily as needed.   feeding supplement (ENSURE PLUS HIGH PROTEIN) LIQD Take 237 mLs by mouth 2 (two) times daily between meals.   levonorgestrel  (MIRENA , 52 MG,) 20 MCG/DAY IUD 1 each by Intrauterine route once.   lidocaine -prilocaine  (EMLA ) cream Apply to affected area once.   ondansetron  (ZOFRAN ) 8 MG tablet Take 1 tablet (8 mg total) by mouth every 8 (eight) hours as needed for nausea or vomiting. Start on the third day after chemotherapy.   potassium chloride  SA (KLOR-CON  M) 20 MEQ tablet  Take 2 tablets (40 mEq total) by mouth 2 (two) times daily. Correction to previous prescription   prochlorperazine  (COMPAZINE ) 10 MG tablet Take 1 tablet (10 mg total) by mouth every 6 (six) hours as needed for nausea or vomiting.   [DISCONTINUED] oxyCODONE  (OXY IR/ROXICODONE ) 5 MG immediate release tablet Take 1-2 tablets (5-10 mg total) by mouth every 4 (four) hours as needed for moderate pain (pain score 4-6).   [DISCONTINUED] polyethylene glycol (MIRALAX  / GLYCOLAX ) 17 g packet Take 17 g by mouth daily as needed for moderate constipation.   [DISCONTINUED] dexamethasone  (DECADRON ) 4 MG tablet Take 2 tablets (8 mg total) by mouth daily. Start the day after chemotherapy for 2 days. Take with food. (Patient not taking: Reported on 05/29/2024)   No facility-administered medications prior to visit.   "

## 2024-06-08 NOTE — Patient Instructions (Signed)
 Use tylenol  650mg  ebery 8hrs for pain <5/10 Use oxycodone  5-10mg  for pain >6/10 Start gabapentin  100mg  at bedtime x 3days, increase to 200mg  if no improvement in pain and no increased sedation. Start sitz bathe after each bowel movement Start miralax  17g in Am and senokot 1tab in PM for consitpation  How to Take a [employer] A sitz bath is a warm water  bath that may be used to care for your rectum, genital area, or the area between your rectum and genitals (perineum). In a sitz bath, the water  only comes up to your hips and covers your buttocks. A sitz bath may be done in a bathtub or with a portable sitz bath that fits over the toilet. Your health care provider may recommend a sitz bath to help: Relieve pain and discomfort after delivering a baby. Relieve pain and itching from hemorrhoids or anal fissures. Relieve pain after certain surgeries. Relax muscles that are sore or tight. How to take a sitz bath Take 2-4 sitz baths a day, or as many as told by your health care provider. Bathtub sitz bath To take a sitz bath in a bathtub: Partially fill a bathtub with warm water . The water  should be deep enough to cover your hips and buttocks when you are sitting in the bathtub. Follow your health care provider's instructions if you are told to put medicine in the water . Sit in the water . Open the bathtub drain a little, and leave it open during your bath. Turn on the warm water  again, enough to replace the water  that is draining out. Keep the water  running throughout your bath. This helps keep the water  at the right level and temperature. Soak in the water  for 15-20 minutes, or as long as told by your health care provider. When you are done, be careful when you stand up. You may feel dizzy. After the sitz bath, pat yourself dry. Do not rub your skin to dry it.  Over-the-toilet sitz bath To take a sitz bath with an over-the-toilet basin: Follow the manufacturer's instructions. Fill the basin with  warm water . Follow your health care provider's instructions if you were told to put medicine in the water . Sit on the seat. Make sure the water  covers your buttocks and perineum. Soak in the water  for 15-20 minutes, or as long as told by your health care provider. After the sitz bath, pat yourself dry. Do not rub your skin to dry it. Clean and dry the basin between uses. Discard the basin if it cracks, or according to the manufacturer's instructions.  Contact a health care provider if: Your pain or itching gets worse. Stop doing sitz baths if your symptoms get worse. You have new symptoms. Stop doing sitz baths until you talk with your health care provider. Summary A sitz bath is a warm water  bath in which the water  only comes up to your hips and covers your buttocks. Your health care provider may recommend a sitz bath to help relieve pain and discomfort after delivering a baby, relieve pain and itching from hemorrhoids or anal fissures, relieve pain after certain surgeries, or help to relax muscles that are sore or tight. Take 2-4 sitz baths a day, or as many as told by your health care provider. Soak in the water  for 15-20 minutes. Stop doing sitz baths if your symptoms get worse. This information is not intended to replace advice given to you by your health care provider. Make sure you discuss any questions you have with your  health care provider. Document Revised: 08/04/2021 Document Reviewed: 08/04/2021 Elsevier Patient Education  2024 [employer].

## 2024-06-09 ENCOUNTER — Inpatient Hospital Stay

## 2024-06-09 LAB — SIGNATERA
SIGNATERA MTM READOUT: 0 MTM/ml
SIGNATERA TEST RESULT: NEGATIVE

## 2024-06-11 ENCOUNTER — Other Ambulatory Visit (HOSPITAL_BASED_OUTPATIENT_CLINIC_OR_DEPARTMENT_OTHER): Payer: Self-pay

## 2024-06-11 ENCOUNTER — Encounter: Payer: Self-pay | Admitting: Nurse Practitioner

## 2024-06-11 NOTE — Assessment & Plan Note (Signed)
 Repeat hgbA1c at 6% Controlled DIABETES without use of medication at this time

## 2024-06-11 NOTE — Assessment & Plan Note (Signed)
" >>  ASSESSMENT AND PLAN FOR CHRONIC IDIOPATHIC CONSTIPATION WRITTEN ON 06/11/2024  1:23 PM BY Daliana Leverett LUM, NP  Acute on chronic pain due to current use of narcotic. She had developed painful external hemorrhoids as a result.  Encouraged to start miralax  in Am, senokot in PM, use of anusol  suppository BID, sitz bathe 1-2x/day, and maintain adequate oral hydration. F/up in 2weeks "

## 2024-06-11 NOTE — Assessment & Plan Note (Signed)
 BP at goal with amlodipine  5mg  BP Readings from Last 3 Encounters:  06/08/24 108/68  05/31/24 111/80  05/28/24 (!) 100/56    Reviewed CMP completed by oncology F/up in 2weeks Consider decreasing med dose if BP remains <100/70

## 2024-06-11 NOTE — Assessment & Plan Note (Signed)
 Secondary to bilateral leg pain post chemotherapy. Describes pain as burning sensation radiating from hip to feet. Rates pain at 10/10. This interferes with sleep. Current use of oxycodone  5-10mg  every 4hrs prn with minimal relief. She reports pain is worse at bedtime. She has upcoming appointment with oncologist on 06/14/2024  We discussed use of gabapentin  100-200mg  at hs. Advised about possible side effects (over sedation, confusion, respiratory suppression) when narcotic is combined with gabapentin . Advised not to administer oxycodone  or gabapentin  if these symptoms are present. Advised to use tylenol  650mg  every 8hrs for mild to moderate pain, oxycodone  for severe pain, and start gabapentin  100-200mg  at bedtime only. Daughters verbalized understanding.

## 2024-06-11 NOTE — Assessment & Plan Note (Signed)
 Acute on chronic pain due to current use of narcotic. She had developed painful external hemorrhoids as a result.  Encouraged to start miralax  in Am, senokot in PM, use of anusol  suppository BID, sitz bathe 1-2x/day, and maintain adequate oral hydration. F/up in 2weeks

## 2024-06-12 ENCOUNTER — Other Ambulatory Visit (HOSPITAL_BASED_OUTPATIENT_CLINIC_OR_DEPARTMENT_OTHER): Payer: Self-pay

## 2024-06-13 ENCOUNTER — Other Ambulatory Visit: Payer: Self-pay

## 2024-06-13 ENCOUNTER — Telehealth: Payer: Self-pay | Admitting: Nurse Practitioner

## 2024-06-14 ENCOUNTER — Inpatient Hospital Stay

## 2024-06-14 ENCOUNTER — Encounter: Payer: Self-pay | Admitting: Hematology

## 2024-06-14 ENCOUNTER — Inpatient Hospital Stay: Admitting: Hematology

## 2024-06-14 ENCOUNTER — Other Ambulatory Visit (HOSPITAL_BASED_OUTPATIENT_CLINIC_OR_DEPARTMENT_OTHER): Payer: Self-pay

## 2024-06-14 VITALS — BP 92/64 | HR 100 | Temp 97.7°F | Resp 17 | Wt 141.5 lb

## 2024-06-14 DIAGNOSIS — D649 Anemia, unspecified: Secondary | ICD-10-CM | POA: Diagnosis not present

## 2024-06-14 DIAGNOSIS — E860 Dehydration: Secondary | ICD-10-CM

## 2024-06-14 DIAGNOSIS — C169 Malignant neoplasm of stomach, unspecified: Secondary | ICD-10-CM

## 2024-06-14 LAB — CMP (CANCER CENTER ONLY)
ALT: 14 U/L (ref 0–44)
AST: 24 U/L (ref 15–41)
Albumin: 3.5 g/dL (ref 3.5–5.0)
Alkaline Phosphatase: 115 U/L (ref 38–126)
Anion gap: 12 (ref 5–15)
BUN: 9 mg/dL (ref 6–20)
CO2: 25 mmol/L (ref 22–32)
Calcium: 8.8 mg/dL — ABNORMAL LOW (ref 8.9–10.3)
Chloride: 102 mmol/L (ref 98–111)
Creatinine: 0.63 mg/dL (ref 0.44–1.00)
GFR, Estimated: >60 mL/min
Glucose, Bld: 103 mg/dL — ABNORMAL HIGH (ref 70–99)
Potassium: 4.2 mmol/L (ref 3.5–5.1)
Sodium: 139 mmol/L (ref 135–145)
Total Bilirubin: 0.6 mg/dL (ref 0.0–1.2)
Total Protein: 7.4 g/dL (ref 6.5–8.1)

## 2024-06-14 LAB — CBC WITH DIFFERENTIAL (CANCER CENTER ONLY)
Abs Immature Granulocytes: 0.14 10*3/uL — ABNORMAL HIGH (ref 0.00–0.07)
Basophils Absolute: 0.1 10*3/uL (ref 0.0–0.1)
Basophils Relative: 1 %
Eosinophils Absolute: 0.1 10*3/uL (ref 0.0–0.5)
Eosinophils Relative: 1 %
HCT: 25.9 % — ABNORMAL LOW (ref 36.0–46.0)
Hemoglobin: 8.4 g/dL — ABNORMAL LOW (ref 12.0–15.0)
Immature Granulocytes: 1 %
Lymphocytes Relative: 18 %
Lymphs Abs: 3.5 10*3/uL (ref 0.7–4.0)
MCH: 30.2 pg (ref 26.0–34.0)
MCHC: 32.4 g/dL (ref 30.0–36.0)
MCV: 93.2 fL (ref 80.0–100.0)
Monocytes Absolute: 1.8 10*3/uL — ABNORMAL HIGH (ref 0.1–1.0)
Monocytes Relative: 9 %
Neutro Abs: 13.9 10*3/uL — ABNORMAL HIGH (ref 1.7–7.7)
Neutrophils Relative %: 70 %
Platelet Count: 413 10*3/uL — ABNORMAL HIGH (ref 150–400)
RBC: 2.78 MIL/uL — ABNORMAL LOW (ref 3.87–5.11)
RDW: 19.4 % — ABNORMAL HIGH (ref 11.5–15.5)
WBC Count: 19.5 10*3/uL — ABNORMAL HIGH (ref 4.0–10.5)
nRBC: 0.2 % (ref 0.0–0.2)

## 2024-06-14 MED ORDER — ALTEPLASE 2 MG IJ SOLR
2.0000 mg | Freq: Once | INTRAMUSCULAR | Status: AC | PRN
  Administered 2024-06-14: 2 mg
  Filled 2024-06-14: qty 2

## 2024-06-14 NOTE — Progress Notes (Signed)
 Cathflo instilled into port at 1406. No blood return noted after 2 hours. Cathflo removed from port and MD notified.  Per Dr. Lanny, order will be placed for dye study: okay to deaccess port and discharge. Patient aware of plan. No s/s or c/o distress or discomfort at this time.

## 2024-06-14 NOTE — Progress Notes (Signed)
 " [facility]   [phone] [phone]   Clinic Follow up Note   Patient Care Team: Nche, Roselie Rockford, NP as PCP - General (Internal Medicine) Waddell Danelle ORN, MD as PCP - Electrophysiology (Cardiology) Verlin Lonni BIRCH, MD as PCP - Cardiology (Cardiology) Prescilla Beams, MD as Consulting Physician (Nephrology) Waddell Danelle ORN, MD as Consulting Physician (Cardiology) Gloriann Chick, MD as Consulting Physician (Obstetrics and Gynecology) Camillo Golas, MD as Attending Physician (Ophthalmology) Lanny Callander, MD as Consulting Physician (Hematology and Oncology)  Date of Service:  06/14/2024  CHIEF COMPLAINT: f/u of gastric cancer  CURRENT THERAPY:  Stopping adjuvant chemotherapy due to deconditioning from chemo  Oncology History   Gastric adenocarcinoma [facility]) pT1bN1M0 stage IB - Patient has chronic stomach issue after surgery for bowel obstruction in January 2021.  Routine screening EGD in July 2025 showed a 5 cm gastric polyps in distal stomach and a biopsy showed high-grade dysplasia and a small focus of adenocarcinoma in situ.  Staging CT scan was negative.  -Status post distal partial gastrostomy on January 11, 2024, which showed 1 cm invasive well to moderately differentiated adenocarcinoma, 1 out of 20 positive lymph node, margins are clear. -given the D2 node dissection, no adjuvant radiation needed, I recommend adjuvant chemotherapy CAPOX for 6 months - She tolerated the second cycle CapeOx poorly, chemo changed to FOLFOX from cycle 3. -Signatera was negative in 02/2024  Assessment & Plan Stage I gastric adenocarcinoma, status post surgery She remains in remission following surgical resection and three months of adjuvant CAPOX/FOLFOX chemotherapy, discontinued early due to intolerance and severe toxicities.  -Recent CT scan and Signatera ctDNA are negative for disease. Prognosis is favorable for Stage I, but there is ongoing risk of  recurrence and late effects. Further chemotherapy is not recommended due to poor tolerance and limited benefit; focus is on recovery and surveillance. - Educated her regarding the reassuring but non-definitive nature of negative imaging and ctDNA results, emphasizing the need for ongoing monitoring. - Ordered repeat CT scan for July 2026 (six months from last scan). - Scheduled next oncology follow-up in approximately six weeks. - Ordered regular blood work every two weeks initially, then every six weeks. - Monitored for signs and symptoms of recurrence and treatment-related complications. - Discussed option to ring the bell to celebrate completion of chemotherapy at next visit, per her preference.  Anemia She has persistent anemia, with hemoglobin decreased to 8.4 g/dL from 9.9 g/dL two weeks prior. Etiology is likely multifactorial, including chemotherapy-induced marrow suppression and possible nutritional deficiencies. She received a transfusion during recent hospitalization. No evidence of active bleeding. Transfusion threshold is hemoglobin <8.0-8.5 g/dL; ongoing monitoring is required. - Instructed her to monitor for signs of bleeding (melena, hematuria) and check stool and urine for blood. - Ordered repeat CBC in two weeks to reassess hemoglobin and other cell lines. - Ordered iron studies, B12, and folate at next visit to evaluate etiology of anemia. - Recommended starting a prenatal multivitamin to address possible nutritional deficiencies (B12, folate). - Scheduled blood transfusion to be available at next visit if indicated by lab results.  Hypotension secondary to antihypertensive therapy and deconditioning  She has persistently low-normal blood pressure, likely related to ongoing antihypertensive therapy in the context of significant weight loss and improved health. She is asymptomatic and maintains adequate oral intake. Discontinuation of antihypertensive medication is recommended. -  Recommended discontinuation of amlodipine ; instructed her to notify her primary care physician and confirm at upcoming appointment. - Instructed  her to monitor blood pressure at home and report values to provider. - Advised temporary increase in dietary sodium intake to help maintain blood pressure. - Encouraged adequate oral hydration with water  and electrolyte-containing fluids (Gatorade, Pedialyte). - Planned reassessment of blood pressure at next visit and with primary care provider.  Chemo-related deconditioning and fatigue She is recovering from significant deconditioning and fatigue following hospitalization, chemotherapy, and severe mucositis. She has made notable progress in mobility and strength, now ambulating with a walker and requiring less use of her wheelchair. She is participating in home-based physical therapy and strength exercises, with support from her caregiver and a physical therapy student. Nutritional status is improving, with weight gain and adequate oral intake. - Continued home-based physical therapy and strength exercises as instructed. - Coordinated with outpatient physical therapist for ongoing rehabilitation; physical therapist to reach out after today's visit. - Encouraged continued ambulation and gradual increase in activity as tolerated. - Monitored nutritional status; encouraged frequent small meals and snacks, as well as adequate hydration. - Reinforced importance of ongoing recovery and gradual return to baseline function. - Scheduled follow-up in six weeks to reassess progress and adjust rehabilitation plan as needed. - Coordinated with physical medicine/rehabilitation for ongoing therapy and recovery.  Plan - She is slowly recovering from her recent hospital admission. -Will stop adjuvant chemotherapy due to her severe deconditioning from chemo. - I recommended her to stop amlodipine  due to borderline hypotension. - She is asymptomatic from hypertension, she  is eating and drinking very well, no IV fluids needed today. - Repeat lab in 2 weeks to see if she needs a blood transfusion. - Lab and follow-up in 6 weeks.   SUMMARY OF ONCOLOGIC HISTORY: Oncology History  Gastric adenocarcinoma (HCC)  11/30/2023 Initial Diagnosis   Gastric adenocarcinoma (HCC)   01/11/2024 Cancer Staging   Staging form: Stomach, AJCC 8th Edition - Pathologic stage from 01/11/2024: Stage IB (pT1b, pN1, cM0) - Signed by Lanny Callander, MD on 01/25/2024 Histologic grade (G): G2 Histologic grading system: 3 grade system Residual tumor (R): R0   02/17/2024 - 03/08/2024 Chemotherapy   Patient is on Treatment Plan : GASTRIC CapeOx (1000/130) q21d x 8 Cycles      04/02/2024 -  Chemotherapy   Patient is on Treatment Plan : GASTRIC FOLFOX q14d x 12 cycles        Discussed the use of AI scribe software for clinical note transcription with the patient, who gave verbal consent to proceed.  History of Present Illness Susan Whitaker is a 54 year old female with stage I gastric adenocarcinoma, status post partial gastrectomy and adjuvant FOLFOX chemotherapy, currently in remission, who presents for post-hospitalization oncology follow-up and surveillance.  She completed about three months of adjuvant chemotherapy, including two cycles of oral plus IV and three cycles of IV FOLFOX, with the last dose the week of Christmas 2025. Treatment was stopped early due to neutropenic fever, severe mucositis, and significant deconditioning that required hospitalization and transfusion.  Since discharge her functional status has improved. She now ambulates at home with a walker and rarely needs a wheelchair. She does daily strength and stretching exercises with caregiver support and a physical therapy student and is awaiting formal home PT. She has gained 10-11 pounds, eats frequent small meals, and has increased appetite and thirst. She denies abdominal pain, bloating, dysphagia, gastrointestinal  bleeding, shortness of breath, chest pain, hematuria, or melena. She maintains hydration orally and has not needed IV fluids.  Surveillance to date includes a CT scan  on May 18, 2024, with no evidence of disease and a negative Signatera ctDNA assay. She is under close monitoring with plans for repeat imaging in six months.  She has ongoing anemia with hemoglobin down to 8.4 g/dL from 9.9 g/dL two weeks earlier. She is not taking iron or a multivitamin but plans to start a prenatal multivitamin. She denies bleeding. She has persistent right leg pain since daily injections in the hospital, sometimes severe and waking her from sleep, without swelling. She uses oxycodone  and occasional loratadine for pain. She had no leg pain before these injections.  She has hypertension previously treated with amlodipine , but home blood pressure has been low-normal around 120 mmHg systolic after weight loss and recovery. She denies dizziness or orthostatic symptoms. She maintains adequate oral intake and hydration and has been advised to increase dietary salt temporarily.  May 28, 2024: Follow-up after hospitalization for neutropenic fever and severe mucositis during adjuvant FOLFOX chemotherapy for stage I gastric cancer; patient recovered from fever and mucositis, remained severely neutropenic (ANC 0.1), afebrile, eating well. Plan to postpone next chemotherapy cycle for 2-4 weeks, consider dose reduction or switch to 5-FU alone, and monitor labs with possible G-CSF administration post-discharge.     All other systems were reviewed with the patient and are negative.  MEDICAL HISTORY:  Past Medical History:  Diagnosis Date   Abdominal bloating 09/14/2020   Abdominal pain 10/04/2020   Allergy     Cancer (HCC) 2025   Gastric Cancer   Class 2 obesity due to excess calories with body mass index (BMI) of 35.0 to 35.9 in adult 06/2010   DM type 2 (diabetes mellitus, type 2) (HCC)    Dysrhythmia    SVT s/p  ablation   Gastric ulcer    GERD (gastroesophageal reflux disease)    H/O constipation    H/O hemorrhoids    History of small bowel obstruction 06/02/2019   Hyperlipidemia    Hypertension    Hypokalemia 2019   Menorrhagia    Microalbuminuria 02/15/2012   Primary hyperaldosteronism 02/21/2012   S/P radiofrequency ablation operation for arrhythmia 10/03/20 10/04/2020   SVT (supraventricular tachycardia)    Noted 11/2011 admission   SVT (supraventricular tachycardia) 12/29/2011   Thrombocytopenia    Type 2 diabetes mellitus with obesity 11/17/2020   IMO SNOMED Dx Update Oct 2024     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    SURGICAL HISTORY: Past Surgical History:  Procedure Laterality Date   ABDOMINAL ADHESION SURGERY  06/03/2019   Dr Curvin   ABDOMINAL SURGERY     86 months of age-- unsure of type of surgery   COLONOSCOPY  04/2016   hemorrhoids--normal per pt with Dr Kristie   DILATATION & CURETTAGE/HYSTEROSCOPY WITH MYOSURE N/A 02/08/2020   Procedure: DILATATION & CURETTAGE/HYSTEROSCOPY WITH MYOSURE;  Surgeon: Gloriann Chick, MD;  Location: [facility];  Service: Gynecology;  Laterality: N/A;   ESOPHAGOGASTRODUODENOSCOPY N/A 12/26/2023   Procedure: EGD (ESOPHAGOGASTRODUODENOSCOPY);  Surgeon: Wilhelmenia Aloha Raddle., MD;  Location: [facility];  Service: Gastroenterology;  Laterality: N/A;   EUS N/A 12/26/2023   Procedure: ULTRASOUND, UPPER GI TRACT, ENDOSCOPIC;  Surgeon: Wilhelmenia Aloha Raddle., MD;  Location: [facility];  Service: Gastroenterology;  Laterality: N/A;   HEMORRHOID SURGERY  2005/2006   INTRAUTERINE DEVICE (IUD) INSERTION N/A 02/08/2020   Procedure: INTRAUTERINE DEVICE (IUD) INSERTION UNDER ULTRASOUND GUIDANCE;  Surgeon: Gloriann Chick, MD;  Location:  SURGERY CENTER;  Service: Gynecology;  Laterality: N/A;  Mirena    LAPAROSCOPY N/A 01/11/2024   Procedure:  LAPAROSCOPY, DIAGNOSTIC;  Surgeon: Aron Shoulders, MD;  Location: [facility];  Service: General;  Laterality: N/A;    LAPAROTOMY N/A 06/03/2019   Procedure: EXPLORATORY LAPAROTOMY WITH LYSIS OF ADHESIONS;  Surgeon: Curvin Deward MOULD, MD;  Location: [facility];  Service: General;  Laterality: N/A;   PARTIAL GASTRECTOMY N/A 01/11/2024   Procedure: GASTRECTOMY, PARTIAL;  Surgeon: Aron Shoulders, MD;  Location: [facility];  Service: General;  Laterality: N/A;  DISTAL GASTRECTOMY   PORTACATH PLACEMENT N/A 02/15/2024   Procedure: INSERTION, TUNNELED CENTRAL VENOUS DEVICE, WITH PORT;  Surgeon: Aron Shoulders, MD;  Location: [facility] OR;  Service: General;  Laterality: N/A;   SVT ABLATION N/A 10/03/2020   Procedure: SVT ABLATION;  Surgeon: Waddell Danelle ORN, MD;  Location: [facility];  Service: Cardiovascular;  Laterality: N/A;   UPPER GASTROINTESTINAL ENDOSCOPY     UTERINE FIBROID EMBOLIZATION  2010   at [facility]    I have reviewed the social history and family history with the patient and they are unchanged from previous note.  ALLERGIES:  is allergic to lisinopril.  MEDICATIONS:  Current Outpatient Medications  Medication Sig Dispense Refill   acetaminophen  (TYLENOL ) 500 MG tablet Take 500 mg by mouth every 6 (six) hours as needed for moderate pain (pain score 4-6).     amLODipine  (NORVASC ) 5 MG tablet Take 1 tablet (5 mg total) by mouth daily. 90 tablet 1   atorvastatin  (LIPITOR) 80 MG tablet Take 1 tablet (80 mg total) by mouth daily. 90 tablet 3   diphenhydrAMINE -nystatin -alum & mag hydroxide-simeth-lidocaine  Take 5 mLs by mouth 4 (four) times daily as needed. 140 mL 1   feeding supplement (ENSURE PLUS HIGH PROTEIN) LIQD Take 237 mLs by mouth 2 (two) times daily between meals.     gabapentin  (NEURONTIN ) 100 MG capsule Take 1-2 capsules (100-200 mg total) by mouth at bedtime. 60 capsule 5   hydrocortisone  (ANUSOL -HC) 25 MG suppository Place 1 suppository (25 mg total) rectally 2 (two) times daily. 24 suppository 0   levonorgestrel  (MIRENA , 52 MG,) 20 MCG/DAY IUD 1 each by Intrauterine route once.     lidocaine -prilocaine   (EMLA ) cream Apply to affected area once. 30 g 3   ondansetron  (ZOFRAN ) 8 MG tablet Take 1 tablet (8 mg total) by mouth every 8 (eight) hours as needed for nausea or vomiting. Start on the third day after chemotherapy. 30 tablet 1   oxyCODONE  (OXY IR/ROXICODONE ) 5 MG immediate release tablet Take 1-2 tablets (5-10 mg total) by mouth every 4 (four) hours as needed for up to 7 days for severe pain (pain score 7-10) or breakthrough pain. 14 tablet 0   polyethylene glycol powder (GLYCOLAX /MIRALAX ) 17 GM/SCOOP powder Take 17 g by mouth daily. Dissolve 1 capful (17g) in 4-8 ounces of liquid and take by mouth daily. 510 g 11   potassium chloride  SA (KLOR-CON  M) 20 MEQ tablet Take 2 tablets (40 mEq total) by mouth 2 (two) times daily. Correction to previous prescription 120 tablet 0   prochlorperazine  (COMPAZINE ) 10 MG tablet Take 1 tablet (10 mg total) by mouth every 6 (six) hours as needed for nausea or vomiting. 30 tablet 1   senna-docusate (SENOKOT S) 8.6-50 MG tablet Take 1 tablet by mouth at bedtime. 100 tablet 3   No current facility-administered medications for this visit.    PHYSICAL EXAMINATION: ECOG PERFORMANCE STATUS: 2 - Symptomatic, <50% confined to bed  Vitals:   06/14/24 1516 06/14/24 1517  BP: (!) 85/65 92/64  Pulse:    Resp:  Temp:    SpO2:     Wt Readings from Last 3 Encounters:  06/14/24 141 lb 8 oz (64.2 kg)  06/08/24 137 lb 9.6 oz (62.4 kg)  05/19/24 127 lb 13.9 oz (58 kg)     GENERAL:alert, no distress and comfortable SKIN: skin color, texture, turgor are normal, no rashes or significant lesions EYES: normal, Conjunctiva are pink and non-injected, sclera clear NECK: supple, thyroid  normal size, non-tender, without nodularity LYMPH:  no palpable lymphadenopathy in the cervical, axillary  LUNGS: clear to auscultation and percussion with normal breathing effort HEART: regular rate & rhythm and no murmurs and no lower extremity edema ABDOMEN:abdomen soft, non-tender  and normal bowel sounds Musculoskeletal:no cyanosis of digits and no clubbing  NEURO: alert & oriented x 3 with fluent speech, no focal motor/sensory deficits  Physical Exam   LABORATORY DATA:  I have reviewed the data as listed    Latest Ref Rng & Units 06/14/2024    2:30 PM 05/31/2024    2:48 PM 05/28/2024    2:54 AM  CBC  WBC 4.0 - 10.5 K/uL 19.5  32.3  4.4   Hemoglobin 12.0 - 15.0 g/dL 8.4  9.9  9.1   Hematocrit 36.0 - 46.0 % 25.9  29.8  25.8   Platelets 150 - 400 K/uL 413  196  32         Latest Ref Rng & Units 06/14/2024    2:30 PM 05/31/2024    2:48 PM 05/28/2024    2:54 AM  CMP  Glucose 70 - 99 mg/dL 896  892  92   BUN 6 - 20 mg/dL 9  8  6    Creatinine 0.44 - 1.00 mg/dL 9.36  9.28  9.46   Sodium 135 - 145 mmol/L 139  142  137   Potassium 3.5 - 5.1 mmol/L 4.2  4.2  4.0   Chloride 98 - 111 mmol/L 102  106  103   CO2 22 - 32 mmol/L 25  26  28    Calcium  8.9 - 10.3 mg/dL 8.8  9.4  8.7   Total Protein 6.5 - 8.1 g/dL 7.4  7.1    Total Bilirubin 0.0 - 1.2 mg/dL 0.6  0.7    Alkaline Phos 38 - 126 U/L 115  107    AST 15 - 41 U/L 24  37    ALT 0 - 44 U/L 14  16        RADIOGRAPHIC STUDIES: I have personally reviewed the radiological images as listed and agreed with the findings in the report. No results found.    Orders Placed This Encounter  Procedures   Ferritin    Standing Status:   Future    Expected Date:   06/21/2024    Expiration Date:   06/14/2025   Vitamin B12    Standing Status:   Standing    Number of Occurrences:   20    Expiration Date:   06/14/2025   Retic Panel    Standing Status:   Future    Expected Date:   06/28/2024    Expiration Date:   06/14/2025   All questions were answered. The patient knows to call the clinic with any problems, questions or concerns. No barriers to learning was detected. The total time spent in the appointment was 30 minutes, including review of chart and various tests results, discussions about plan of care and coordination  of care plan     Onita Mattock, MD 06/14/2024     "

## 2024-06-14 NOTE — Assessment & Plan Note (Signed)
 pT1bN1M0 stage IB - Patient has chronic stomach issue after surgery for bowel obstruction in January 2021.  Routine screening EGD in July 2025 showed a 5 cm gastric polyps in distal stomach and a biopsy showed high-grade dysplasia and a small focus of adenocarcinoma in situ.  Staging CT scan was negative.  -Status post distal partial gastrostomy on January 11, 2024, which showed 1 cm invasive well to moderately differentiated adenocarcinoma, 1 out of 20 positive lymph node, margins are clear. -given the D2 node dissection, no adjuvant radiation needed, I recommend adjuvant chemotherapy CAPOX for 6 months - She tolerated the second cycle CapeOx poorly, chemo changed to FOLFOX from cycle 3. -Signatera was negative in 02/2024

## 2024-06-15 ENCOUNTER — Encounter: Payer: Self-pay | Admitting: Hematology

## 2024-06-15 ENCOUNTER — Other Ambulatory Visit: Payer: Self-pay

## 2024-06-19 ENCOUNTER — Encounter: Payer: Self-pay | Admitting: Nurse Practitioner

## 2024-06-20 ENCOUNTER — Telehealth: Payer: Self-pay

## 2024-06-20 NOTE — Telephone Encounter (Signed)
 Copied from CRM [id number]. Topic: Clinical - Home Health Verbal Orders >> Jun 20, 2024  4:18 PM Drema MATSU wrote: Caller/Agency: [facility] Callback Number: [phone] Service Requested: Occupational Therapy Frequency: Additional 2 week 1 and then 1 week 1 Any new concerns about the patient? No

## 2024-06-20 NOTE — Telephone Encounter (Signed)
Agree with VO

## 2024-06-21 ENCOUNTER — Inpatient Hospital Stay

## 2024-06-21 ENCOUNTER — Ambulatory Visit: Admitting: Endocrinology

## 2024-06-21 ENCOUNTER — Encounter: Payer: Self-pay | Admitting: Endocrinology

## 2024-06-21 ENCOUNTER — Inpatient Hospital Stay: Admitting: Hematology

## 2024-06-21 ENCOUNTER — Ambulatory Visit: Admitting: Nurse Practitioner

## 2024-06-21 ENCOUNTER — Encounter: Payer: Self-pay | Admitting: Nurse Practitioner

## 2024-06-21 ENCOUNTER — Other Ambulatory Visit (HOSPITAL_BASED_OUTPATIENT_CLINIC_OR_DEPARTMENT_OTHER): Payer: Self-pay

## 2024-06-21 VITALS — BP 122/52 | HR 120 | Ht 63.0 in | Wt 144.0 lb

## 2024-06-21 VITALS — BP 124/70 | HR 118 | Ht 63.0 in | Wt 145.4 lb

## 2024-06-21 DIAGNOSIS — M79604 Pain in right leg: Secondary | ICD-10-CM

## 2024-06-21 DIAGNOSIS — K641 Second degree hemorrhoids: Secondary | ICD-10-CM

## 2024-06-21 DIAGNOSIS — E119 Type 2 diabetes mellitus without complications: Secondary | ICD-10-CM

## 2024-06-21 DIAGNOSIS — I10 Essential (primary) hypertension: Secondary | ICD-10-CM

## 2024-06-21 DIAGNOSIS — K5903 Drug induced constipation: Secondary | ICD-10-CM

## 2024-06-21 DIAGNOSIS — F1190 Opioid use, unspecified, uncomplicated: Secondary | ICD-10-CM

## 2024-06-21 MED ORDER — ACCU-CHEK GUIDE W/DEVICE KIT
1.0000 | PACK | Freq: Every day | 0 refills | Status: AC
  Filled 2024-06-21: qty 1, 100d supply, fill #0

## 2024-06-21 MED ORDER — LANCET DEVICE MISC
1.0000 | Freq: Three times a day (TID) | 0 refills | Status: AC
  Filled 2024-06-21: qty 1, 30d supply, fill #0

## 2024-06-21 MED ORDER — LANCETS MISC
1.0000 | Freq: Four times a day (QID) | 3 refills | Status: AC
  Filled 2024-06-21: qty 100, 25d supply, fill #0

## 2024-06-21 MED ORDER — BLOOD GLUCOSE TEST VI STRP
1.0000 | ORAL_STRIP | Freq: Every day | 3 refills | Status: AC
  Filled 2024-06-21: qty 100, 100d supply, fill #0

## 2024-06-21 MED ORDER — GABAPENTIN 100 MG PO CAPS
100.0000 mg | ORAL_CAPSULE | Freq: Two times a day (BID) | ORAL | 1 refills | Status: AC
  Filled 2024-06-21: qty 180, 90d supply, fill #0

## 2024-06-21 NOTE — Patient Instructions (Addendum)
 Request for PT and OT referral forwarded to Dr. Lanny Pacific current meds Schedule appointment for annual eye exam if possible

## 2024-06-21 NOTE — Progress Notes (Signed)
 "  Outpatient Endocrinology Note Grey Schlauch, MD   Patient's Name: Susan Whitaker    DOB: 19-Jul-1970    MRN: [id number]                                                    REASON OF VISIT: Follow up for type 2 diabetes mellitus  PCP: Nche, Roselie Rockford, NP  HISTORY OF PRESENT ILLNESS:   Susan Whitaker is a 54 y.o. old female with past medical history listed below, is here for follow up for type 2 diabetes mellitus.   Pertinent Diabetes History: Patient was previously seen by Dr. Von and was last time seen in July 2024.  Patient was diagnosed with type 2 diabetes mellitus in 2013.  Patient was diagnosed with diabetes when she was having workup for fibroid surgery, baseline A1c was 6.9%.  She has controlled type 2 diabetes mellitus.  Chronic Diabetes Complications : Retinopathy: no. Last ophthalmology exam was done on 04/2023, following with ophthalmology regularly.  Nephropathy: no, on ACE/ARB /losartan . Peripheral neuropathy: no Coronary artery disease: no Stroke: no  Relevant comorbidities and cardiovascular risk factors: Obesity: yes Body mass index is 25.51 kg/m.  Hypertension: Yes  Hyperlipidemia : Yes, on statin   Current / Home Diabetic regimen includes:  None   Prior diabetic medications: Metformin  is stopped due to diarrhea.  She had taken glipizide  in the past.  Had taken Mounjaro  and Invokana .  Glycemic data:   She has not been checking blood sugar at home.  Blood sugar low 100 on recent hospitalization for multiple days in January 2026.  Hypoglycemia: Patient has no hypoglycemic episodes. Patient has hypoglycemia awareness.  Factors modifying glucose control: 1.  Diabetic diet assessment: 3 meals a day.  2.  Staying active or exercising: Walking.  3.  Medication compliance: compliant all of the time.  Other: Patient was diagnosed with gastric adenocarcinoma diagnosed in July 2025, status post partial gastrostomy in August 2025, treated with chemotherapy.   Following with oncology.  Interval history  Patient presented for follow-up.  Accompanied by 2 daughters.  No glucose data to review.  Blood sugar when she was hospitalized in January were in acceptable range mostly in the low 100s.  She has not been on antidiabetic medication.  Hemoglobin A1c was 6%.  She has anemia also.  She was hospitalized in the beginning of January due to sepsis.  No numbness and tingling to feet.  No vision problem.  No other complaints today.  REVIEW OF SYSTEMS As per history of present illness.   PAST MEDICAL HISTORY: Past Medical History:  Diagnosis Date   Abdominal bloating 09/14/2020   Abdominal pain 10/04/2020   Allergy     Cancer (HCC) 2025   Gastric Cancer   Class 2 obesity due to excess calories with body mass index (BMI) of 35.0 to 35.9 in adult 06/2010   DM type 2 (diabetes mellitus, type 2) (HCC)    Dysrhythmia    SVT s/p ablation   Gastric ulcer    GERD (gastroesophageal reflux disease)    H/O constipation    H/O hemorrhoids    History of small bowel obstruction 06/02/2019   Hyperlipidemia    Hypertension    Hypokalemia 2019   Menorrhagia    Microalbuminuria 02/15/2012   Primary hyperaldosteronism 02/21/2012   S/P radiofrequency ablation operation  for arrhythmia 10/03/20 10/04/2020   SVT (supraventricular tachycardia)    Noted 11/2011 admission   SVT (supraventricular tachycardia) 12/29/2011   Thrombocytopenia    Type 2 diabetes mellitus with obesity 11/17/2020   IMO SNOMED Dx Update Oct 2024     [facility]) 09/15/2020    PAST SURGICAL HISTORY: Past Surgical History:  Procedure Laterality Date   ABDOMINAL ADHESION SURGERY  06/03/2019   Dr Curvin   ABDOMINAL SURGERY     8 months of age-- unsure of type of surgery   COLONOSCOPY  04/2016   hemorrhoids--normal per pt with Dr Kristie   DILATATION & CURETTAGE/HYSTEROSCOPY WITH MYOSURE N/A 02/08/2020   Procedure: DILATATION & CURETTAGE/HYSTEROSCOPY WITH MYOSURE;  Surgeon: Gloriann Chick, MD;   Location: [facility];  Service: Gynecology;  Laterality: N/A;   ESOPHAGOGASTRODUODENOSCOPY N/A 12/26/2023   Procedure: EGD (ESOPHAGOGASTRODUODENOSCOPY);  Surgeon: Wilhelmenia Aloha Raddle., MD;  Location: [facility];  Service: Gastroenterology;  Laterality: N/A;   EUS N/A 12/26/2023   Procedure: ULTRASOUND, UPPER GI TRACT, ENDOSCOPIC;  Surgeon: Wilhelmenia Aloha Raddle., MD;  Location: [facility];  Service: Gastroenterology;  Laterality: N/A;   HEMORRHOID SURGERY  2005/2006   INTRAUTERINE DEVICE (IUD) INSERTION N/A 02/08/2020   Procedure: INTRAUTERINE DEVICE (IUD) INSERTION UNDER ULTRASOUND GUIDANCE;  Surgeon: Gloriann Chick, MD;  Location: [facility];  Service: Gynecology;  Laterality: N/A;  Mirena    LAPAROSCOPY N/A 01/11/2024   Procedure: LAPAROSCOPY, DIAGNOSTIC;  Surgeon: Aron Shoulders, MD;  Location: [facility];  Service: General;  Laterality: N/A;   LAPAROTOMY N/A 06/03/2019   Procedure: EXPLORATORY LAPAROTOMY WITH LYSIS OF ADHESIONS;  Surgeon: Curvin Deward MOULD, MD;  Location: [facility];  Service: General;  Laterality: N/A;   PARTIAL GASTRECTOMY N/A 01/11/2024   Procedure: GASTRECTOMY, PARTIAL;  Surgeon: Aron Shoulders, MD;  Location: [facility];  Service: General;  Laterality: N/A;  DISTAL GASTRECTOMY   PORTACATH PLACEMENT N/A 02/15/2024   Procedure: INSERTION, TUNNELED CENTRAL VENOUS DEVICE, WITH PORT;  Surgeon: Aron Shoulders, MD;  Location: [facility];  Service: General;  Laterality: N/A;   SVT ABLATION N/A 10/03/2020   Procedure: SVT ABLATION;  Surgeon: Waddell Danelle ORN, MD;  Location: [facility];  Service: Cardiovascular;  Laterality: N/A;   UPPER GASTROINTESTINAL ENDOSCOPY     UTERINE FIBROID EMBOLIZATION  2010   at [facility]    ALLERGIES: Allergies  Allergen Reactions   Lisinopril Cough    FAMILY HISTORY:  Family History  Problem Relation Age of Onset   Cancer Mother 12       breast   Stroke Mother    Vision loss Father        some   Heart disease Father         Rhythm disturbance   Hypertension Sister    Hypertension Brother    Hypertension Brother    Cancer Maternal Aunt 56       Breast   Cancer Maternal Grandmother 5       breast   Hypertension Maternal Grandmother    Cancer Other        breast, lung   Hypertension Other    Stroke Other    Diabetes Neg Hx    Colon cancer Neg Hx    Esophageal cancer Neg Hx    Stomach cancer Neg Hx    Rectal cancer Neg Hx     SOCIAL HISTORY: Social History   Socioeconomic History   Marital status: Single    Spouse name: Not on file   Number  of children: 2   Years of education: Not on file   Highest education level: Not on file  Occupational History   Occupation: YOUTH EMPLOYMENT    Employer: [employer]   Occupation: egineering  Tobacco Use   Smoking status: Never   Smokeless tobacco: Never  Vaping Use   Vaping status: Never Used  Substance and Sexual Activity   Alcohol  use: No   Drug use: No   Sexual activity: Yes    Partners: Male    Birth control/protection: I.U.D.  Other Topics Concern   Not on file  Social History Narrative   Works at [employer]         Social Drivers of Health   Tobacco Use: Low Risk (06/21/2024)   Patient History    Smoking Tobacco Use: Never    Smokeless Tobacco Use: Never    Passive Exposure: Not on file  Financial Resource Strain: Not on file  Food Insecurity: No Food Insecurity (05/29/2024)   Epic    Worried About Programme Researcher, Broadcasting/film/video in the Last Year: Never true    Ran Out of Food in the Last Year: Never true  Transportation Needs: No Transportation Needs (05/29/2024)   Epic    Lack of Transportation (Medical): No    Lack of Transportation (Non-Medical): No  Physical Activity: Not on file  Stress: Not on file  Social Connections: Not on file  Depression (PHQ2-9): Low Risk (06/14/2024)   Depression (PHQ2-9)    PHQ-2 Score: 0  Alcohol  Screen: Not on file  Housing: Unknown (05/29/2024)   Epic    Unable to Pay for Housing in the Last Year:  No    Number of Times Moved in the Last Year: Not on file    Homeless in the Last Year: No  Utilities: Not At Risk (05/29/2024)   Epic    Threatened with loss of utilities: No  Health Literacy: Not on file    MEDICATIONS:  Current Outpatient Medications  Medication Sig Dispense Refill   acetaminophen  (TYLENOL ) 500 MG tablet Take 500 mg by mouth every 6 (six) hours as needed for moderate pain (pain score 4-6).     Blood Glucose Monitoring Suppl (ACCU-CHEK GUIDE) w/Device KIT 1 each by Does not apply route daily. 1 kit 0   diphenhydrAMINE -nystatin -alum & mag hydroxide-simeth-lidocaine  Take 5 mLs by mouth 4 (four) times daily as needed. 140 mL 1   feeding supplement (ENSURE PLUS HIGH PROTEIN) LIQD Take 237 mLs by mouth 2 (two) times daily between meals.     gabapentin  (NEURONTIN ) 100 MG capsule Take 1-2 capsules (100-200 mg total) by mouth at bedtime. 60 capsule 5   Glucose Blood (BLOOD GLUCOSE TEST STRIPS) STRP Use to test once daily 100 each 3   hydrocortisone  (ANUSOL -HC) 25 MG suppository Place 1 suppository (25 mg total) rectally 2 (two) times daily. 24 suppository 0   Lancet Device MISC 1 each by Does not apply route in the morning, at noon, and at bedtime. May substitute to any manufacturer covered by patient's insurance. 1 each 0   Lancets MISC Use up to four times daily as directed. 100 each 3   levonorgestrel  (MIRENA , 52 MG,) 20 MCG/DAY IUD 1 each by Intrauterine route once.     polyethylene glycol powder (GLYCOLAX /MIRALAX ) 17 GM/SCOOP powder Take 17 g by mouth daily. Dissolve 1 capful (17g) in 4-8 ounces of liquid and take by mouth daily. 510 g 11   potassium chloride  SA (KLOR-CON  M) 20 MEQ tablet Take 2  tablets (40 mEq total) by mouth 2 (two) times daily. Correction to previous prescription 120 tablet 0   senna-docusate (SENOKOT S) 8.6-50 MG tablet Take 1 tablet by mouth at bedtime. 100 tablet 3   amLODipine  (NORVASC ) 5 MG tablet Take 1 tablet (5 mg total) by mouth daily. (Patient not  taking: Reported on 06/21/2024) 90 tablet 1   atorvastatin  (LIPITOR) 80 MG tablet Take 1 tablet (80 mg total) by mouth daily. (Patient not taking: Reported on 06/21/2024) 90 tablet 3   No current facility-administered medications for this visit.    PHYSICAL EXAM: Vitals:   06/21/24 1037  BP: (!) 122/52  Pulse: (!) 120  SpO2: 97%  Weight: 144 lb (65.3 kg)  Height: 5' 3 (1.6 m)    Body mass index is 25.51 kg/m.  Wt Readings from Last 3 Encounters:  06/21/24 144 lb (65.3 kg)  06/14/24 141 lb 8 oz (64.2 kg)  06/08/24 137 lb 9.6 oz (62.4 kg)    General: Well developed, well nourished female in no apparent distress.  HEENT: AT/[place], no external lesions.  Eyes: Conjunctiva clear and no icterus. Neck: Neck supple  Lungs: Respirations not labored Neurologic: Alert, oriented, normal speech Extremities / Skin: Dry.  Psychiatric: Does not appear depressed or anxious  Diabetic Foot Exam - Simple   Simple Foot Form Diabetic Foot exam was performed with the following findings: Yes 06/21/2024 10:52 AM  Visual Inspection No deformities, no ulcerations, no other skin breakdown bilaterally: Yes Sensation Testing Intact to touch and monofilament testing bilaterally: Yes Pulse Check Posterior Tibialis and Dorsalis pulse intact bilaterally: Yes Comments Dry bilaterally.      LABS Reviewed Lab Results  Component Value Date   HGBA1C 6.0 (A) 06/08/2024   HGBA1C 5.7 (A) 01/23/2024   HGBA1C 5.8 (H) 01/09/2024   Lab Results  Component Value Date   FRUCTOSAMINE 241 05/03/2018   FRUCTOSAMINE 273 08/01/2017   FRUCTOSAMINE 275 10/16/2015   Lab Results  Component Value Date   CHOL 156 11/17/2023   HDL 47.60 11/17/2023   LDLCALC 93 11/17/2023   LDLDIRECT 158.0 08/05/2022   TRIG 75.0 11/17/2023   CHOLHDL 3 11/17/2023   Lab Results  Component Value Date   MICRALBCREAT 16.4 09/14/2023   MICRALBCREAT 51.1 (H) 04/13/2013   Lab Results  Component Value Date   CREATININE 0.63 06/14/2024    Lab Results  Component Value Date   GFR 76.44 11/17/2023   GFR 76.44 11/17/2023   GFR 76.44 11/17/2023    ASSESSMENT / PLAN  1. Controlled type 2 diabetes mellitus without complication, without long-term current use of insulin  (HCC)     Diabetes Mellitus type 2, complicated by no known complications. - Diabetic status / severity: Controlled.  Lab Results  Component Value Date   HGBA1C 6.0 (A) 06/08/2024    - Hemoglobin A1c goal : <6.5%  Hemoglobin A1c 6%.  Blood sugar when hospitalized in January acceptable.  No concerning hypoglycemia.  She is currently not on antidiabetic medication for few months.  -MEDICATIONS: - Will monitor without antidiabetic medications.  - Home glucose testing: in the morning fasting and at bedtime, at least 2 times of week.  Discussed that blood sugar around bedtime should be around 200 and in the morning fasting should be 100-150 range.  Asked to call our clinic if there is any concern of blood sugar.  - Discussed/ Gave Hypoglycemia treatment plan.  # Consult : not required at this time.   # Annual urine for microalbuminuria/ creatinine ratio,  no microalbuminuria currently, continue ACE/ARB /losartan . Last  Lab Results  Component Value Date   MICRALBCREAT 16.4 09/14/2023    # Foot check nightly.  # Annual dilated diabetic eye exams.   - Diet: Make healthy diabetic food choices - Life style / activity / exercise: Discussed.  2. Blood pressure  -  BP Readings from Last 1 Encounters:  06/21/24 (!) 122/52    - Control is in target.  - No change in current plans.  3. Lipid status / Hyperlipidemia - Last  Lab Results  Component Value Date   LDLCALC 93 11/17/2023   - Continue atorvastatin  40 mg daily.   Susan Whitaker was seen today for diabetes.  Diagnoses and all orders for this visit:  Controlled type 2 diabetes mellitus without complication, without long-term current use of insulin  (HCC) -     Blood Glucose Monitoring Suppl  (ACCU-CHEK GUIDE) w/Device KIT; 1 each by Does not apply route daily. -     Glucose Blood (BLOOD GLUCOSE TEST STRIPS) STRP; Use to test once daily -     Lancet Device MISC; 1 each by Does not apply route in the morning, at noon, and at bedtime. May substitute to any manufacturer covered by patient's insurance. -     Lancets MISC; Use up to four times daily as directed.    DISPOSITION Follow up in clinic in 4 months suggested.    All questions answered and patient verbalized understanding of the plan.  Susan Couzens, MD [facility] [address] [place], [address] [place], [place] [address] Phone # [phone]  At least part of this note was generated using voice recognition software. Inadvertent word errors may have occurred, which were not recognized during the proofreading process. "

## 2024-06-21 NOTE — Assessment & Plan Note (Signed)
 Current use of OVER THE COUNTER preparation H cream and wipes at this time. Did not use anusol  as prescribed. States hemorrhoid is still bulging but no bleeding or pain. Constipation has resolved.  Advised to use anusol  supppository to decrease inflammation and prevent bleeding.

## 2024-06-21 NOTE — Progress Notes (Signed)
 "               Established Patient Visit  Patient: Susan Whitaker   DOB: June 15, 1970   54 y.o. Female  MRN: [id number] Visit Date: 06/21/2024  Subjective:    Chief Complaint  Patient presents with   Follow-up    3 month follow up  for HTN Hyperlipidemia and DM    HPI HTN (hypertension) Amlodipine  discontinued due hypotension BP at goal without med at this time BP Readings from Last 3 Encounters:  06/21/24 124/70  06/21/24 (!) 122/52  06/14/24 92/64    Continue to monitor BP at this time  Hemorrhoids Current use of OVER THE COUNTER preparation H cream and wipes at this time. Did not use anusol  as prescribed. States hemorrhoid is still bulging but no bleeding or pain. Constipation has resolved.  Advised to use anusol  supppository to decrease inflammation and prevent bleeding.  Drug-induced constipation Resolved with use of miralax  2-3x/week. Denies need for senokot.  Chronic narcotic use Improved leg pain and sleep with gabapentin  100mg  at hs. Resolved itching. Has discontinued use of claritin. Denies any daytime somnolence. Reports intermittent pain in AM despite use of narcotic.  Increase gabapentin  to 100mg  BID F/up in 3months  BP Readings from Last 3 Encounters:  06/21/24 124/70  06/21/24 (!) 122/52  06/14/24 92/64    Wt Readings from Last 3 Encounters:  06/21/24 145 lb 6.4 oz (66 kg)  06/21/24 144 lb (65.3 kg)  06/14/24 141 lb 8 oz (64.2 kg)    Reviewed medical, surgical, and social history today  Medications: Show/hide medication list[1] Reviewed past medical and social history.   ROS per HPI above      Objective:  BP 124/70 (BP Location: Right Arm, Patient Position: Sitting, Cuff Size: Normal)   Pulse (!) 118   Ht 5' 3 (1.6 m)   Wt 145 lb 6.4 oz (66 kg)   LMP  (LMP Unknown)   SpO2 100%   BMI 25.76 kg/m      Physical Exam Vitals reviewed.  Cardiovascular:     Rate and Rhythm: Normal rate.     Pulses: Normal pulses.  Pulmonary:      Effort: Pulmonary effort is normal.  Musculoskeletal:     Right lower leg: No edema.     Left lower leg: No edema.  Neurological:     Mental Status: She is alert and oriented to person, place, and time.  Psychiatric:        Mood and Affect: Mood normal.        Behavior: Behavior normal.        Thought Content: Thought content normal.     No results found for any visits on 06/21/24.    Assessment & Plan:    Problem List Items Addressed This Visit     Chronic narcotic use   Improved leg pain and sleep with gabapentin  100mg  at hs. Resolved itching. Has discontinued use of claritin. Denies any daytime somnolence. Reports intermittent pain in AM despite use of narcotic.  Increase gabapentin  to 100mg  BID F/up in 3months      Relevant Medications   gabapentin  (NEURONTIN ) 100 MG capsule   Drug-induced constipation   Resolved with use of miralax  2-3x/week. Denies need for senokot.      Hemorrhoids   Current use of OVER THE COUNTER preparation H cream and wipes at this time. Did not use anusol  as prescribed. States hemorrhoid is still bulging but no bleeding or pain. Constipation has  resolved.  Advised to use anusol  supppository to decrease inflammation and prevent bleeding.      HTN (hypertension) - Primary   Amlodipine  discontinued due hypotension BP at goal without med at this time BP Readings from Last 3 Encounters:  06/21/24 124/70  06/21/24 (!) 122/52  06/14/24 92/64    Continue to monitor BP at this time      Other Visit Diagnoses       Pain in both lower extremities       Relevant Medications   gabapentin  (NEURONTIN ) 100 MG capsule      Return in about 3 months (around 09/18/2024) for HTN, DM, hyperlipidemia, constipation.     Roselie Mood, NP      [1]  Outpatient Medications Prior to Visit  Medication Sig   acetaminophen  (TYLENOL ) 500 MG tablet Take 500 mg by mouth every 6 (six) hours as needed for moderate pain (pain score 4-6).    atorvastatin  (LIPITOR) 80 MG tablet Take 1 tablet (80 mg total) by mouth daily.   diphenhydrAMINE -nystatin -alum & mag hydroxide-simeth-lidocaine  Take 5 mLs by mouth 4 (four) times daily as needed.   feeding supplement (ENSURE PLUS HIGH PROTEIN) LIQD Take 237 mLs by mouth 2 (two) times daily between meals.   Glucose Blood (BLOOD GLUCOSE TEST STRIPS) STRP Use to test once daily   hydrocortisone  (ANUSOL -HC) 25 MG suppository Place 1 suppository (25 mg total) rectally 2 (two) times daily.   Lancet Device MISC 1 each by Does not apply route in the morning, at noon, and at bedtime. May substitute to any manufacturer covered by patient's insurance.   Lancets MISC Use up to four times daily as directed.   levonorgestrel  (MIRENA , 52 MG,) 20 MCG/DAY IUD 1 each by Intrauterine route once.   polyethylene glycol powder (GLYCOLAX /MIRALAX ) 17 GM/SCOOP powder Take 17 g by mouth daily. Dissolve 1 capful (17g) in 4-8 ounces of liquid and take by mouth daily.   potassium chloride  SA (KLOR-CON  M) 20 MEQ tablet Take 2 tablets (40 mEq total) by mouth 2 (two) times daily. Correction to previous prescription   Prenatal Vit-Fe Fumarate-FA (PRENATAL PO) Take by mouth.   [DISCONTINUED] gabapentin  (NEURONTIN ) 100 MG capsule Take 1-2 capsules (100-200 mg total) by mouth at bedtime.   [DISCONTINUED] senna-docusate (SENOKOT S) 8.6-50 MG tablet Take 1 tablet by mouth at bedtime.   Blood Glucose Monitoring Suppl (ACCU-CHEK GUIDE) w/Device KIT 1 each by Does not apply route daily.   [DISCONTINUED] amLODipine  (NORVASC ) 5 MG tablet Take 1 tablet (5 mg total) by mouth daily. (Patient not taking: Reported on 06/21/2024)   No facility-administered medications prior to visit.   "

## 2024-06-21 NOTE — Assessment & Plan Note (Signed)
 Amlodipine  discontinued due hypotension BP at goal without med at this time BP Readings from Last 3 Encounters:  06/21/24 124/70  06/21/24 (!) 122/52  06/14/24 92/64    Continue to monitor BP at this time

## 2024-06-21 NOTE — Assessment & Plan Note (Signed)
 Improved leg pain and sleep with gabapentin  100mg  at hs. Resolved itching. Has discontinued use of claritin. Denies any daytime somnolence. Reports intermittent pain in AM despite use of narcotic.  Increase gabapentin  to 100mg  BID F/up in 3months

## 2024-06-21 NOTE — Assessment & Plan Note (Signed)
 Resolved with use of miralax  2-3x/week. Denies need for senokot.

## 2024-06-22 ENCOUNTER — Telehealth: Payer: Self-pay

## 2024-06-22 NOTE — Telephone Encounter (Signed)
 Copied from CRM #[id number]. Topic: Clinical - Home Health Verbal Orders >> Jun 22, 2024  3:20 PM Burnard DEL wrote: Caller/Agency: [facility] Callback Number: [phone] Service Requested: Occupational Therapy Frequency: 2wk1 1wk1 Any new concerns about the patient? No

## 2024-06-23 ENCOUNTER — Inpatient Hospital Stay

## 2024-06-29 ENCOUNTER — Inpatient Hospital Stay

## 2024-06-29 ENCOUNTER — Inpatient Hospital Stay: Attending: Hematology

## 2024-07-02 ENCOUNTER — Inpatient Hospital Stay: Attending: Hematology

## 2024-07-02 ENCOUNTER — Inpatient Hospital Stay

## 2024-07-05 ENCOUNTER — Inpatient Hospital Stay

## 2024-07-05 ENCOUNTER — Inpatient Hospital Stay: Admitting: Hematology

## 2024-07-07 ENCOUNTER — Inpatient Hospital Stay

## 2024-07-19 ENCOUNTER — Inpatient Hospital Stay: Admitting: Hematology

## 2024-07-19 ENCOUNTER — Inpatient Hospital Stay

## 2024-07-21 ENCOUNTER — Inpatient Hospital Stay

## 2024-07-26 ENCOUNTER — Inpatient Hospital Stay

## 2024-07-26 ENCOUNTER — Inpatient Hospital Stay: Attending: Hematology | Admitting: Hematology

## 2024-09-20 ENCOUNTER — Ambulatory Visit: Admitting: Nurse Practitioner

## 2024-10-19 ENCOUNTER — Ambulatory Visit: Admitting: Endocrinology
